# Patient Record
Sex: Female | Born: 1935 | Race: White | Hispanic: No | Smoking: Never smoker
Health system: Southern US, Community
[De-identification: ages and names within clinical notes are randomized; demographics above are authoritative.]

## PROBLEM LIST (undated history)

## (undated) DIAGNOSIS — Z923 Personal history of irradiation: Secondary | ICD-10-CM

## (undated) DIAGNOSIS — C4490 Unspecified malignant neoplasm of skin, unspecified: Secondary | ICD-10-CM

## (undated) DIAGNOSIS — E039 Hypothyroidism, unspecified: Secondary | ICD-10-CM

## (undated) DIAGNOSIS — R112 Nausea with vomiting, unspecified: Secondary | ICD-10-CM

## (undated) DIAGNOSIS — C189 Malignant neoplasm of colon, unspecified: Secondary | ICD-10-CM

## (undated) DIAGNOSIS — H9192 Unspecified hearing loss, left ear: Secondary | ICD-10-CM

## (undated) DIAGNOSIS — K219 Gastro-esophageal reflux disease without esophagitis: Secondary | ICD-10-CM

## (undated) DIAGNOSIS — Z9889 Other specified postprocedural states: Secondary | ICD-10-CM

## (undated) DIAGNOSIS — M1990 Unspecified osteoarthritis, unspecified site: Secondary | ICD-10-CM

## (undated) DIAGNOSIS — F32A Depression, unspecified: Secondary | ICD-10-CM

## (undated) DIAGNOSIS — E785 Hyperlipidemia, unspecified: Secondary | ICD-10-CM

## (undated) DIAGNOSIS — C801 Malignant (primary) neoplasm, unspecified: Secondary | ICD-10-CM

## (undated) DIAGNOSIS — I10 Essential (primary) hypertension: Secondary | ICD-10-CM

## (undated) DIAGNOSIS — F329 Major depressive disorder, single episode, unspecified: Secondary | ICD-10-CM

## (undated) HISTORY — PX: THYROIDECTOMY, PARTIAL: SHX18

## (undated) HISTORY — PX: CHOLECYSTECTOMY: SHX55

## (undated) HISTORY — PX: ERCP: SHX60

## (undated) HISTORY — PX: TONSILLECTOMY: SUR1361

## (undated) HISTORY — DX: Malignant (primary) neoplasm, unspecified: C80.1

## (undated) HISTORY — PX: EYE SURGERY: SHX253

## (undated) HISTORY — PX: APPENDECTOMY: SHX54

## (undated) HISTORY — PX: ABDOMINAL HYSTERECTOMY: SHX81

---

## 2003-06-13 ENCOUNTER — Encounter: Payer: Self-pay | Admitting: Internal Medicine

## 2003-06-13 ENCOUNTER — Encounter: Admission: RE | Admit: 2003-06-13 | Discharge: 2003-06-13 | Payer: Self-pay | Admitting: Internal Medicine

## 2003-07-12 ENCOUNTER — Encounter: Payer: Self-pay | Admitting: Internal Medicine

## 2003-07-12 ENCOUNTER — Encounter: Admission: RE | Admit: 2003-07-12 | Discharge: 2003-07-12 | Payer: Self-pay | Admitting: Internal Medicine

## 2003-08-24 ENCOUNTER — Ambulatory Visit (HOSPITAL_COMMUNITY): Admission: RE | Admit: 2003-08-24 | Discharge: 2003-08-24 | Payer: Self-pay | Admitting: Gastroenterology

## 2007-02-17 ENCOUNTER — Encounter: Admission: RE | Admit: 2007-02-17 | Discharge: 2007-02-17 | Payer: Self-pay | Admitting: Internal Medicine

## 2009-03-29 ENCOUNTER — Encounter: Admission: RE | Admit: 2009-03-29 | Discharge: 2009-03-29 | Payer: Self-pay | Admitting: Orthopedic Surgery

## 2010-04-04 ENCOUNTER — Encounter: Admission: RE | Admit: 2010-04-04 | Discharge: 2010-04-04 | Payer: Self-pay | Admitting: Internal Medicine

## 2011-05-09 NOTE — Op Note (Signed)
NAME:  Cindy [name], Cindy [name]                   ACCOUNT NO.:  [id number]   MEDICAL RECORD NO.:  [id number]                   PATIENT TYPE:  AMB   LOCATION:  ENDO                                 FACILITY:  [facility]   PHYSICIAN:  Danise Edge, M.D.                DATE OF BIRTH:  December 29, 1929   DATE OF PROCEDURE:  08/24/2003  DATE OF DISCHARGE:                                 OPERATIVE REPORT   PROCEDURE PERFORMED:  Esophagogastroduodenoscopy and colonoscopy.   ENDOSCOPIST:  Charolett Bumpers, M.D.   INDICATIONS FOR PROCEDURE:  [name] is a 77 year old female born  1930-10-16.  [name] has epigastric discomfort despite taking  a proton pump inhibitor and is scheduled to undergo a diagnostic  esophagogastroduodenoscopy.  She has a history of diverticulitis and is  scheduled to undergo a screening colonoscopy with polypectomy to prevent  colon cancer.   PREMEDICATION:  Versed 7.5 mg, Demerol 50 mg.   PROCEDURE:  Esophagogastroduodenoscopy.  After obtaining informed consent,  [name] was placed in the left lateral decubitus position.  I  administered intravenous Demerol and intravenous Versed to achieve conscious  sedation for the procedure.  The patient's blood pressure, oxygen  saturations and cardiac rhythm were monitored throughout the procedure and  documented in the medical record.   The Olympus gastroscope was passed through the posterior hypopharynx into  the proximal esophagus without difficulty.  The hypopharynx, larynx, and  vocal cords appeared normal.  I did not visualize the vocal cords.   Esophagoscopy:  The proximal and mid and lower segments of the esophagus  appeared normal endoscopically.   Gastroscopy:  Retroflex view of the gastric cardia and fundus was normal.  The gastric body, antrum and pylorus appeared normal.   Duodenoscopy:  The duodenal bulb and descending duodenum appeared normal.   ASSESSMENT:  Normal  esophagogastroduodenoscopy.   PROCEDURE:  Screening proctocolonoscopy to the cecum.  Anal inspection was  normal.  Digital rectal exam was normal.  The Olympus pediatric adjustable  colonoscope was introduced into the rectum and easily advanced to the cecum.  Colonic preparation for the exam today was excellent.   Rectum:  Normal.   Sigmoid colon and descending colon:  Left colonic diverticulosis without  diverticulitis or diverticular stricture formation.   Splenic flexure:  Normal.   Transverse colon:  Normal.   Hepatic flexure:  Normal.   Ascending colon:  Normal.   Cecum and ileocecal valve:  Normal.   ASSESSMENT:  Normal proctocolonoscopy to the cecum.  Left colonic  diverticulosis noted.  Moderate sized internal hemorrhoids are present.                                                Danise Edge, M.D.    MJ/MEDQ  D:  08/24/2003  T:  08/24/2003  Job:  [id number]   cc:   Candyce Churn, M.D.  [address] [place]  [place] [address]  Fax: [phone]

## 2011-06-06 ENCOUNTER — Other Ambulatory Visit: Payer: Self-pay | Admitting: Internal Medicine

## 2011-06-06 DIAGNOSIS — R51 Headache: Secondary | ICD-10-CM

## 2011-06-14 ENCOUNTER — Ambulatory Visit
Admission: RE | Admit: 2011-06-14 | Discharge: 2011-06-14 | Disposition: A | Discharge status: Home / Self Care | Payer: Medicare Other | Source: Ambulatory Visit | Attending: Internal Medicine | Admitting: Internal Medicine

## 2011-06-14 DIAGNOSIS — R51 Headache: Secondary | ICD-10-CM

## 2011-06-18 ENCOUNTER — Other Ambulatory Visit (HOSPITAL_COMMUNITY): Payer: Self-pay | Admitting: Neurosurgery

## 2011-06-18 DIAGNOSIS — I671 Cerebral aneurysm, nonruptured: Secondary | ICD-10-CM

## 2011-06-20 ENCOUNTER — Other Ambulatory Visit (HOSPITAL_COMMUNITY): Payer: Self-pay | Admitting: Neurosurgery

## 2011-06-20 ENCOUNTER — Ambulatory Visit (HOSPITAL_COMMUNITY)
Admission: RE | Admit: 2011-06-20 | Discharge: 2011-06-20 | Disposition: A | Discharge status: Home / Self Care | Payer: Medicare Other | Source: Ambulatory Visit | Attending: Neurosurgery | Admitting: Neurosurgery

## 2011-06-20 DIAGNOSIS — R51 Headache: Secondary | ICD-10-CM | POA: Insufficient documentation

## 2011-06-20 DIAGNOSIS — I671 Cerebral aneurysm, nonruptured: Secondary | ICD-10-CM

## 2011-06-20 DIAGNOSIS — Z01812 Encounter for preprocedural laboratory examination: Secondary | ICD-10-CM | POA: Insufficient documentation

## 2011-06-20 DIAGNOSIS — Z79899 Other long term (current) drug therapy: Secondary | ICD-10-CM | POA: Insufficient documentation

## 2011-06-20 LAB — CBC
HCT: 35.7 % — ABNORMAL LOW (ref 36.0–46.0)
Hemoglobin: 12.5 g/dL (ref 12.0–15.0)
MCV: 92.5 fL (ref 78.0–100.0)
RBC: 3.86 MIL/uL — ABNORMAL LOW (ref 3.87–5.11)
RDW: 12.2 % (ref 11.5–15.5)
WBC: 5.3 10*3/uL (ref 4.0–10.5)

## 2011-06-20 LAB — BASIC METABOLIC PANEL
CO2: 26 mEq/L (ref 19–32)
Calcium: 9.4 mg/dL (ref 8.4–10.5)
Creatinine, Ser: 0.83 mg/dL (ref 0.50–1.10)
GFR calc Af Amer: >60 mL/min (ref >60)

## 2011-06-20 LAB — APTT: aPTT: 26 seconds (ref 24–37)

## 2011-06-20 LAB — PROTIME-INR: INR: 0.95 (ref 0.00–1.49)

## 2011-06-20 MED ORDER — IOHEXOL 300 MG/ML  SOLN
200.0000 mL | Freq: Once | INTRAMUSCULAR | Status: AC | PRN
  Administered 2011-06-20: 85 mL via INTRA_ARTERIAL

## 2011-10-16 ENCOUNTER — Ambulatory Visit (HOSPITAL_COMMUNITY)
Admission: RE | Admit: 2011-10-16 | Discharge: 2011-10-16 | Disposition: A | Discharge status: Home / Self Care | Payer: Medicare Other | Source: Ambulatory Visit | Attending: Orthopedic Surgery | Admitting: Orthopedic Surgery

## 2011-10-16 DIAGNOSIS — M79609 Pain in unspecified limb: Secondary | ICD-10-CM | POA: Insufficient documentation

## 2011-12-25 DIAGNOSIS — M238X9 Other internal derangements of unspecified knee: Secondary | ICD-10-CM | POA: Diagnosis not present

## 2011-12-25 DIAGNOSIS — M25569 Pain in unspecified knee: Secondary | ICD-10-CM | POA: Diagnosis not present

## 2011-12-25 DIAGNOSIS — R262 Difficulty in walking, not elsewhere classified: Secondary | ICD-10-CM | POA: Diagnosis not present

## 2011-12-30 DIAGNOSIS — M238X9 Other internal derangements of unspecified knee: Secondary | ICD-10-CM | POA: Diagnosis not present

## 2011-12-30 DIAGNOSIS — M25569 Pain in unspecified knee: Secondary | ICD-10-CM | POA: Diagnosis not present

## 2011-12-30 DIAGNOSIS — R262 Difficulty in walking, not elsewhere classified: Secondary | ICD-10-CM | POA: Diagnosis not present

## 2011-12-31 ENCOUNTER — Other Ambulatory Visit (HOSPITAL_COMMUNITY): Payer: Self-pay | Admitting: Neurosurgery

## 2011-12-31 DIAGNOSIS — I671 Cerebral aneurysm, nonruptured: Secondary | ICD-10-CM

## 2012-01-01 DIAGNOSIS — M25569 Pain in unspecified knee: Secondary | ICD-10-CM | POA: Diagnosis not present

## 2012-01-02 DIAGNOSIS — M238X9 Other internal derangements of unspecified knee: Secondary | ICD-10-CM | POA: Diagnosis not present

## 2012-01-02 DIAGNOSIS — R262 Difficulty in walking, not elsewhere classified: Secondary | ICD-10-CM | POA: Diagnosis not present

## 2012-01-02 DIAGNOSIS — M25569 Pain in unspecified knee: Secondary | ICD-10-CM | POA: Diagnosis not present

## 2012-01-05 ENCOUNTER — Ambulatory Visit (HOSPITAL_COMMUNITY)
Admission: RE | Admit: 2012-01-05 | Discharge: 2012-01-05 | Disposition: A | Discharge status: Home / Self Care | Payer: Medicare Other | Source: Ambulatory Visit | Attending: Neurosurgery | Admitting: Neurosurgery

## 2012-01-05 DIAGNOSIS — I671 Cerebral aneurysm, nonruptured: Secondary | ICD-10-CM | POA: Diagnosis not present

## 2012-01-05 DIAGNOSIS — Z09 Encounter for follow-up examination after completed treatment for conditions other than malignant neoplasm: Secondary | ICD-10-CM | POA: Insufficient documentation

## 2012-01-05 LAB — BUN: BUN: 20 mg/dL (ref 6–23)

## 2012-01-05 MED ORDER — IOHEXOL 350 MG/ML SOLN
50.0000 mL | Freq: Once | INTRAVENOUS | Status: AC | PRN
  Administered 2012-01-05: 50 mL via INTRAVENOUS

## 2012-01-06 DIAGNOSIS — M238X9 Other internal derangements of unspecified knee: Secondary | ICD-10-CM | POA: Diagnosis not present

## 2012-01-06 DIAGNOSIS — M25569 Pain in unspecified knee: Secondary | ICD-10-CM | POA: Diagnosis not present

## 2012-01-06 DIAGNOSIS — R262 Difficulty in walking, not elsewhere classified: Secondary | ICD-10-CM | POA: Diagnosis not present

## 2012-01-13 DIAGNOSIS — R262 Difficulty in walking, not elsewhere classified: Secondary | ICD-10-CM | POA: Diagnosis not present

## 2012-01-13 DIAGNOSIS — M238X9 Other internal derangements of unspecified knee: Secondary | ICD-10-CM | POA: Diagnosis not present

## 2012-01-13 DIAGNOSIS — M25569 Pain in unspecified knee: Secondary | ICD-10-CM | POA: Diagnosis not present

## 2012-01-15 DIAGNOSIS — M238X9 Other internal derangements of unspecified knee: Secondary | ICD-10-CM | POA: Diagnosis not present

## 2012-01-15 DIAGNOSIS — M25569 Pain in unspecified knee: Secondary | ICD-10-CM | POA: Diagnosis not present

## 2012-01-15 DIAGNOSIS — R262 Difficulty in walking, not elsewhere classified: Secondary | ICD-10-CM | POA: Diagnosis not present

## 2012-01-21 DIAGNOSIS — I671 Cerebral aneurysm, nonruptured: Secondary | ICD-10-CM | POA: Diagnosis not present

## 2012-02-11 ENCOUNTER — Other Ambulatory Visit: Payer: Self-pay | Admitting: Internal Medicine

## 2012-02-11 DIAGNOSIS — E782 Mixed hyperlipidemia: Secondary | ICD-10-CM | POA: Diagnosis not present

## 2012-02-11 DIAGNOSIS — Z79899 Other long term (current) drug therapy: Secondary | ICD-10-CM | POA: Diagnosis not present

## 2012-02-11 DIAGNOSIS — E538 Deficiency of other specified B group vitamins: Secondary | ICD-10-CM | POA: Diagnosis not present

## 2012-02-11 DIAGNOSIS — B0229 Other postherpetic nervous system involvement: Secondary | ICD-10-CM | POA: Diagnosis not present

## 2012-02-11 DIAGNOSIS — Z0000 Encounter for general adult medical examination without abnormal findings: Secondary | ICD-10-CM | POA: Diagnosis not present

## 2012-02-11 DIAGNOSIS — M79609 Pain in unspecified limb: Secondary | ICD-10-CM | POA: Diagnosis not present

## 2012-02-11 DIAGNOSIS — R609 Edema, unspecified: Secondary | ICD-10-CM | POA: Diagnosis not present

## 2012-02-11 DIAGNOSIS — E039 Hypothyroidism, unspecified: Secondary | ICD-10-CM | POA: Diagnosis not present

## 2012-02-11 DIAGNOSIS — M79669 Pain in unspecified lower leg: Secondary | ICD-10-CM

## 2012-02-12 ENCOUNTER — Ambulatory Visit
Admission: RE | Admit: 2012-02-12 | Discharge: 2012-02-12 | Disposition: A | Discharge status: Home / Self Care | Payer: Medicare Other | Source: Ambulatory Visit | Attending: Internal Medicine | Admitting: Internal Medicine

## 2012-02-12 DIAGNOSIS — M7989 Other specified soft tissue disorders: Secondary | ICD-10-CM | POA: Diagnosis not present

## 2012-02-12 DIAGNOSIS — M79609 Pain in unspecified limb: Secondary | ICD-10-CM | POA: Diagnosis not present

## 2012-02-12 DIAGNOSIS — M79669 Pain in unspecified lower leg: Secondary | ICD-10-CM

## 2012-02-18 DIAGNOSIS — E782 Mixed hyperlipidemia: Secondary | ICD-10-CM | POA: Diagnosis not present

## 2012-02-18 DIAGNOSIS — M79609 Pain in unspecified limb: Secondary | ICD-10-CM | POA: Diagnosis not present

## 2012-02-20 DIAGNOSIS — Z803 Family history of malignant neoplasm of breast: Secondary | ICD-10-CM | POA: Diagnosis not present

## 2012-02-20 DIAGNOSIS — Z1231 Encounter for screening mammogram for malignant neoplasm of breast: Secondary | ICD-10-CM | POA: Diagnosis not present

## 2012-02-23 ENCOUNTER — Other Ambulatory Visit: Payer: Self-pay | Admitting: Orthopedic Surgery

## 2012-02-23 DIAGNOSIS — M7120 Synovial cyst of popliteal space [Baker], unspecified knee: Secondary | ICD-10-CM

## 2012-02-23 DIAGNOSIS — M238X9 Other internal derangements of unspecified knee: Secondary | ICD-10-CM | POA: Diagnosis not present

## 2012-02-26 ENCOUNTER — Ambulatory Visit
Admission: RE | Admit: 2012-02-26 | Discharge: 2012-02-26 | Disposition: A | Discharge status: Home / Self Care | Payer: Medicare Other | Source: Ambulatory Visit | Attending: Orthopedic Surgery | Admitting: Orthopedic Surgery

## 2012-02-26 DIAGNOSIS — M7120 Synovial cyst of popliteal space [Baker], unspecified knee: Secondary | ICD-10-CM

## 2012-02-26 DIAGNOSIS — S8010XA Contusion of unspecified lower leg, initial encounter: Secondary | ICD-10-CM | POA: Diagnosis not present

## 2012-02-26 DIAGNOSIS — M79609 Pain in unspecified limb: Secondary | ICD-10-CM | POA: Diagnosis not present

## 2012-04-14 DIAGNOSIS — H3530 Unspecified macular degeneration: Secondary | ICD-10-CM | POA: Diagnosis not present

## 2012-04-21 DIAGNOSIS — E538 Deficiency of other specified B group vitamins: Secondary | ICD-10-CM | POA: Diagnosis not present

## 2012-04-21 DIAGNOSIS — M79609 Pain in unspecified limb: Secondary | ICD-10-CM | POA: Diagnosis not present

## 2012-04-21 DIAGNOSIS — E782 Mixed hyperlipidemia: Secondary | ICD-10-CM | POA: Diagnosis not present

## 2012-04-26 DIAGNOSIS — M25569 Pain in unspecified knee: Secondary | ICD-10-CM | POA: Diagnosis not present

## 2012-04-26 DIAGNOSIS — M545 Low back pain: Secondary | ICD-10-CM | POA: Diagnosis not present

## 2012-05-03 ENCOUNTER — Other Ambulatory Visit: Payer: Self-pay | Admitting: Orthopaedic Surgery

## 2012-05-03 DIAGNOSIS — M25561 Pain in right knee: Secondary | ICD-10-CM

## 2012-05-03 DIAGNOSIS — M1710 Unilateral primary osteoarthritis, unspecified knee: Secondary | ICD-10-CM | POA: Diagnosis not present

## 2012-05-05 ENCOUNTER — Ambulatory Visit
Admission: RE | Admit: 2012-05-05 | Discharge: 2012-05-05 | Disposition: A | Discharge status: Home / Self Care | Payer: Medicare Other | Source: Ambulatory Visit | Attending: Orthopaedic Surgery | Admitting: Orthopaedic Surgery

## 2012-05-05 DIAGNOSIS — M2240 Chondromalacia patellae, unspecified knee: Secondary | ICD-10-CM | POA: Diagnosis not present

## 2012-05-05 DIAGNOSIS — M25569 Pain in unspecified knee: Secondary | ICD-10-CM | POA: Diagnosis not present

## 2012-05-05 DIAGNOSIS — S83419A Sprain of medial collateral ligament of unspecified knee, initial encounter: Secondary | ICD-10-CM | POA: Diagnosis not present

## 2012-05-05 DIAGNOSIS — M25561 Pain in right knee: Secondary | ICD-10-CM

## 2012-05-07 ENCOUNTER — Other Ambulatory Visit: Payer: Self-pay | Admitting: Orthopaedic Surgery

## 2012-05-07 DIAGNOSIS — M25569 Pain in unspecified knee: Secondary | ICD-10-CM | POA: Diagnosis not present

## 2012-05-07 NOTE — H&P (Signed)
Cindy [name] is an 78 y.o. female.   Chief Complaint: right knee pain HPI: this patient continues to complain of right knee pain multiple weeks in duration.  She is having trouble sleeping at nighttime and ambulating.  She describes her pain as being intermittent and severe.  We have tried a recent corticosteroid injection with minimal to no benefit.  He recent MRI scan reveals some degenerative changes with a new medial meniscus tear.  We have discussed proceeding with a knee arthroscopy to take care of the meniscus tear noted on the MRI scan.  No past medical history on file. Positive for hypertension and positive for hypothyroidism positive for GERD and positive for cholesterol issues Medication list: Norvasc, thyroid replacement, clonazepam, Nexium,micardis and Crestor  No past surgical history on file.  No family history on file. Social History:  does not have a smoking history on file. She does not have any smokeless tobacco history on file. Her alcohol and drug histories not on file.  Allergies: Allergies not on file Allergy to morphine sulfate  No prescriptions prior to admission    No results found for this or any previous visit (from the past 48 hour(s)). No results found.  Review of Systems  Constitutional: Negative.   HENT: Negative.   Eyes: Negative.   Respiratory: Negative.   Cardiovascular: Negative.   Gastrointestinal: Negative.   Genitourinary: Negative.   Musculoskeletal: Negative.   Skin: Negative.   Neurological: Negative.   Endo/Heme/Allergies: Negative.   Psychiatric/Behavioral: Negative.     There were no vitals taken for this visit. Physical Exam  Constitutional: She is oriented to person, place, and time. She appears well-nourished.  HENT:  Head: Atraumatic.  Eyes: Pupils are equal, round, and reactive to light.  Neck: Normal range of motion.  Cardiovascular: Regular rhythm.   Respiratory: Breath sounds normal.  GI: Soft.  Musculoskeletal:         Right knee exam: Trace effusion.  Range of motion 0 to 1:30.  Mild patellofemoral crepitation 1+.  Pain along the medial joint line to palpation and a mildly positive McMurray's test as well for pain.  Neurological: She is oriented to person, place, and time.  Skin: Skin is warm.  Psychiatric: She has a normal mood and affect.     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  We have discussed the risks of anesthesia and infection and DVT associated with knee arthroscopy.  Have also stressed to her the importance of postoperative physical therapy to optimize her results.  Cindy [name] R 05/07/2012, 1:11 PM

## 2012-05-10 ENCOUNTER — Encounter (HOSPITAL_BASED_OUTPATIENT_CLINIC_OR_DEPARTMENT_OTHER): Payer: Self-pay | Admitting: *Deleted

## 2012-05-10 ENCOUNTER — Encounter (HOSPITAL_BASED_OUTPATIENT_CLINIC_OR_DEPARTMENT_OTHER)
Admission: RE | Admit: 2012-05-10 | Discharge: 2012-05-10 | Disposition: A | Discharge status: Home / Self Care | Payer: Medicare Other | Source: Ambulatory Visit | Attending: Orthopaedic Surgery | Admitting: Orthopaedic Surgery

## 2012-05-10 DIAGNOSIS — I10 Essential (primary) hypertension: Secondary | ICD-10-CM | POA: Diagnosis not present

## 2012-05-10 DIAGNOSIS — M23329 Other meniscus derangements, posterior horn of medial meniscus, unspecified knee: Secondary | ICD-10-CM | POA: Diagnosis not present

## 2012-05-10 DIAGNOSIS — Z01810 Encounter for preprocedural cardiovascular examination: Secondary | ICD-10-CM | POA: Diagnosis not present

## 2012-05-10 DIAGNOSIS — M2240 Chondromalacia patellae, unspecified knee: Secondary | ICD-10-CM | POA: Diagnosis not present

## 2012-05-10 DIAGNOSIS — E039 Hypothyroidism, unspecified: Secondary | ICD-10-CM | POA: Diagnosis not present

## 2012-05-10 DIAGNOSIS — K219 Gastro-esophageal reflux disease without esophagitis: Secondary | ICD-10-CM | POA: Diagnosis not present

## 2012-05-10 DIAGNOSIS — M1710 Unilateral primary osteoarthritis, unspecified knee: Secondary | ICD-10-CM | POA: Diagnosis not present

## 2012-05-10 LAB — BASIC METABOLIC PANEL
BUN: 17 mg/dL (ref 6–23)
CO2: 24 mEq/L (ref 19–32)
Chloride: 96 mEq/L (ref 96–112)
GFR calc non Af Amer: 78 mL/min — ABNORMAL LOW (ref >90)
Glucose, Bld: 104 mg/dL — ABNORMAL HIGH (ref 70–99)
Potassium: 3.7 mEq/L (ref 3.5–5.1)
Sodium: 131 mEq/L — ABNORMAL LOW (ref 135–145)

## 2012-05-10 NOTE — Progress Notes (Signed)
Cindy [name] come in for labs and ekg

## 2012-05-11 ENCOUNTER — Encounter (HOSPITAL_BASED_OUTPATIENT_CLINIC_OR_DEPARTMENT_OTHER): Payer: Self-pay | Admitting: *Deleted

## 2012-05-11 ENCOUNTER — Encounter (HOSPITAL_BASED_OUTPATIENT_CLINIC_OR_DEPARTMENT_OTHER)
Admission: RE | Disposition: A | Discharge status: Home / Self Care | Payer: Self-pay | Source: Ambulatory Visit | Attending: Orthopaedic Surgery

## 2012-05-11 ENCOUNTER — Encounter (HOSPITAL_BASED_OUTPATIENT_CLINIC_OR_DEPARTMENT_OTHER): Payer: Self-pay | Admitting: Anesthesiology

## 2012-05-11 ENCOUNTER — Ambulatory Visit (HOSPITAL_BASED_OUTPATIENT_CLINIC_OR_DEPARTMENT_OTHER): Payer: Medicare Other | Admitting: *Deleted

## 2012-05-11 ENCOUNTER — Ambulatory Visit (HOSPITAL_BASED_OUTPATIENT_CLINIC_OR_DEPARTMENT_OTHER)
Admission: RE | Admit: 2012-05-11 | Discharge: 2012-05-11 | Disposition: A | Discharge status: Home / Self Care | Payer: Medicare Other | Source: Ambulatory Visit | Attending: Orthopaedic Surgery | Admitting: Orthopaedic Surgery

## 2012-05-11 DIAGNOSIS — I10 Essential (primary) hypertension: Secondary | ICD-10-CM | POA: Insufficient documentation

## 2012-05-11 DIAGNOSIS — M23329 Other meniscus derangements, posterior horn of medial meniscus, unspecified knee: Secondary | ICD-10-CM | POA: Insufficient documentation

## 2012-05-11 DIAGNOSIS — M2240 Chondromalacia patellae, unspecified knee: Secondary | ICD-10-CM | POA: Insufficient documentation

## 2012-05-11 DIAGNOSIS — IMO0002 Reserved for concepts with insufficient information to code with codable children: Secondary | ICD-10-CM | POA: Diagnosis not present

## 2012-05-11 DIAGNOSIS — M1710 Unilateral primary osteoarthritis, unspecified knee: Secondary | ICD-10-CM | POA: Insufficient documentation

## 2012-05-11 DIAGNOSIS — Z01810 Encounter for preprocedural cardiovascular examination: Secondary | ICD-10-CM | POA: Insufficient documentation

## 2012-05-11 DIAGNOSIS — S83249A Other tear of medial meniscus, current injury, unspecified knee, initial encounter: Secondary | ICD-10-CM

## 2012-05-11 DIAGNOSIS — K219 Gastro-esophageal reflux disease without esophagitis: Secondary | ICD-10-CM | POA: Diagnosis not present

## 2012-05-11 DIAGNOSIS — E039 Hypothyroidism, unspecified: Secondary | ICD-10-CM | POA: Insufficient documentation

## 2012-05-11 DIAGNOSIS — M9420 Chondromalacia, unspecified site: Secondary | ICD-10-CM | POA: Diagnosis not present

## 2012-05-11 HISTORY — DX: Nausea with vomiting, unspecified: R11.2

## 2012-05-11 HISTORY — DX: Other specified postprocedural states: Z98.890

## 2012-05-11 HISTORY — DX: Hyperlipidemia, unspecified: E78.5

## 2012-05-11 HISTORY — DX: Depression, unspecified: F32.A

## 2012-05-11 HISTORY — PX: KNEE ARTHROSCOPY: SHX127

## 2012-05-11 HISTORY — DX: Unspecified osteoarthritis, unspecified site: M19.90

## 2012-05-11 HISTORY — DX: Gastro-esophageal reflux disease without esophagitis: K21.9

## 2012-05-11 HISTORY — DX: Hypothyroidism, unspecified: E03.9

## 2012-05-11 HISTORY — DX: Essential (primary) hypertension: I10

## 2012-05-11 HISTORY — DX: Major depressive disorder, single episode, unspecified: F32.9

## 2012-05-11 HISTORY — DX: Unspecified hearing loss, left ear: H91.92

## 2012-05-11 SURGERY — ARTHROSCOPY, KNEE
Anesthesia: Choice | Site: Knee | Laterality: Right | Wound class: Clean

## 2012-05-11 MED ORDER — DROPERIDOL 2.5 MG/ML IJ SOLN
INTRAMUSCULAR | Status: DC | PRN
  Administered 2012-05-11: 0.625 mg via INTRAVENOUS

## 2012-05-11 MED ORDER — PROPOFOL 10 MG/ML IV EMUL
INTRAVENOUS | Status: DC | PRN
  Administered 2012-05-11 (×2): 20 mg via INTRAVENOUS
  Administered 2012-05-11: 10 mg via INTRAVENOUS
  Administered 2012-05-11: 50 mg via INTRAVENOUS

## 2012-05-11 MED ORDER — BUPIVACAINE HCL (PF) 0.25 % IJ SOLN
INTRAMUSCULAR | Status: DC | PRN
  Administered 2012-05-11: 30 mL

## 2012-05-11 MED ORDER — HYDROMORPHONE HCL PF 1 MG/ML IJ SOLN: 0.2500 mg | INTRAMUSCULAR | Status: DC | PRN

## 2012-05-11 MED ORDER — FENTANYL CITRATE 0.05 MG/ML IJ SOLN
50.0000 ug | INTRAMUSCULAR | Status: DC | PRN
Start: 2012-05-11 — End: 2012-05-11
  Administered 2012-05-11: 50 ug via INTRAVENOUS

## 2012-05-11 MED ORDER — PROPOFOL 10 MG/ML IV EMUL
INTRAVENOUS | Status: DC | PRN
  Administered 2012-05-11: 75 ug/kg/min via INTRAVENOUS

## 2012-05-11 MED ORDER — DEXAMETHASONE SODIUM PHOSPHATE 4 MG/ML IJ SOLN
INTRAMUSCULAR | Status: DC | PRN
  Administered 2012-05-11: 10 mg via INTRAVENOUS

## 2012-05-11 MED ORDER — LACTATED RINGERS IV SOLN
INTRAVENOUS | Status: DC
  Administered 2012-05-11 (×3): via INTRAVENOUS

## 2012-05-11 MED ORDER — OXYCODONE-ACETAMINOPHEN 5-325 MG PO TABS: 1.0000 | ORAL_TABLET | ORAL | Status: AC | PRN

## 2012-05-11 MED ORDER — CEFAZOLIN SODIUM 1-5 GM-% IV SOLN
INTRAVENOUS | Status: DC | PRN
  Administered 2012-05-11: 1 g via INTRAVENOUS

## 2012-05-11 MED ORDER — MIDAZOLAM HCL 2 MG/2ML IJ SOLN
1.0000 mg | INTRAMUSCULAR | Status: DC | PRN
  Administered 2012-05-11: 1 mg via INTRAVENOUS

## 2012-05-11 MED ORDER — ACETAMINOPHEN 10 MG/ML IV SOLN
1000.0000 mg | Freq: Once | INTRAVENOUS | Status: AC
  Administered 2012-05-11: 1000 mg via INTRAVENOUS

## 2012-05-11 MED ORDER — ONDANSETRON HCL 4 MG/2ML IJ SOLN
INTRAMUSCULAR | Status: DC | PRN
  Administered 2012-05-11: 4 mg via INTRAVENOUS

## 2012-05-11 MED ORDER — METHYLPREDNISOLONE ACETATE 80 MG/ML IJ SUSP
INTRAMUSCULAR | Status: DC | PRN
  Administered 2012-05-11: 80 mg via INTRA_ARTICULAR

## 2012-05-11 SURGICAL SUPPLY — 39 items
BANDAGE ELASTIC 6 VELCRO ST LF (GAUZE/BANDAGES/DRESSINGS) ×2 IMPLANT
BANDAGE GAUZE ELAST BULKY 4 IN (GAUZE/BANDAGES/DRESSINGS) ×2 IMPLANT
BLADE CUDA 5.5 (BLADE) IMPLANT
BLADE GREAT WHITE 4.2 (BLADE) ×2 IMPLANT
CANISTER OMNI JUG 16 LITER (MISCELLANEOUS) ×1 IMPLANT
CANISTER SUCTION 2500CC (MISCELLANEOUS) IMPLANT
DRAPE ARTHROSCOPY W/POUCH 114 (DRAPES) ×2 IMPLANT
DRAPE U-SHAPE 47X51 STRL (DRAPES) ×2 IMPLANT
DRSG EMULSION OIL 3X3 NADH (GAUZE/BANDAGES/DRESSINGS) ×2 IMPLANT
DURAPREP 26ML APPLICATOR (WOUND CARE) ×2 IMPLANT
ELECT MENISCUS 165MM 90D (ELECTRODE) IMPLANT
ELECT REM PT RETURN 9FT ADLT (ELECTROSURGICAL)
ELECTRODE REM PT RTRN 9FT ADLT (ELECTROSURGICAL) IMPLANT
GLOVE BIO SURGEON STRL SZ 6.5 (GLOVE) ×1 IMPLANT
GLOVE BIO SURGEON STRL SZ8.5 (GLOVE) ×2 IMPLANT
GLOVE BIOGEL PI IND STRL 7.0 (GLOVE) IMPLANT
GLOVE BIOGEL PI IND STRL 8 (GLOVE) ×1 IMPLANT
GLOVE BIOGEL PI IND STRL 8.5 (GLOVE) ×1 IMPLANT
GLOVE BIOGEL PI INDICATOR 7.0 (GLOVE) ×1
GLOVE BIOGEL PI INDICATOR 8 (GLOVE) ×1
GLOVE BIOGEL PI INDICATOR 8.5 (GLOVE) ×1
GLOVE SS BIOGEL STRL SZ 8 (GLOVE) ×1 IMPLANT
GLOVE SUPERSENSE BIOGEL SZ 8 (GLOVE) ×1
GOWN PREVENTION PLUS XLARGE (GOWN DISPOSABLE) ×4 IMPLANT
GOWN PREVENTION PLUS XXLARGE (GOWN DISPOSABLE) ×1 IMPLANT
KNEE WRAP E Z 3 GEL PACK (MISCELLANEOUS) ×3 IMPLANT
PACK ARTHROSCOPY DSU (CUSTOM PROCEDURE TRAY) ×2 IMPLANT
PACK BASIN DAY SURGERY FS (CUSTOM PROCEDURE TRAY) ×2 IMPLANT
PENCIL BUTTON HOLSTER BLD 10FT (ELECTRODE) IMPLANT
SET ARTHROSCOPY TUBING (MISCELLANEOUS) ×2
SET ARTHROSCOPY TUBING LN (MISCELLANEOUS) ×1 IMPLANT
SHEET MEDIUM DRAPE 40X70 STRL (DRAPES) ×2 IMPLANT
SPONGE GAUZE 4X4 12PLY (GAUZE/BANDAGES/DRESSINGS) ×2 IMPLANT
SYR 3ML 18GX1 1/2 (SYRINGE) IMPLANT
TOWEL OR 17X24 6PK STRL BLUE (TOWEL DISPOSABLE) ×2 IMPLANT
TOWEL OR NON WOVEN STRL DISP B (DISPOSABLE) ×2 IMPLANT
WAND 30 DEG SABER W/CORD (SURGICAL WAND) IMPLANT
WAND STAR VAC 90 (SURGICAL WAND) IMPLANT
WATER STERILE IRR 1000ML POUR (IV SOLUTION) ×2 IMPLANT

## 2012-05-11 NOTE — Anesthesia Preprocedure Evaluation (Addendum)
Anesthesia Evaluation  Patient identified by MRN, date of birth, ID band Patient awake    Reviewed: Allergy & Precautions, H&P , NPO status , Patient's Chart, lab work & pertinent test results  History of Anesthesia Complications (+) PONV  Airway Mallampati: II TM Distance: >3 FB Neck ROM: Full    Dental No notable dental hx. (+) Edentulous Upper and Dental Advisory Given   Pulmonary neg pulmonary ROS,  breath sounds clear to auscultation  Pulmonary exam normal       Cardiovascular hypertension, On Medications Rhythm:Regular Rate:Normal     Neuro/Psych PSYCHIATRIC DISORDERS negative neurological ROS     GI/Hepatic Neg liver ROS, GERD-  Medicated,  Endo/Other  negative endocrine ROS  Renal/GU negative Renal ROS  negative genitourinary   Musculoskeletal   Abdominal   Peds  Hematology negative hematology ROS (+)   Anesthesia Other Findings   Reproductive/Obstetrics negative OB ROS                           Anesthesia Physical Anesthesia Plan  ASA: II  Anesthesia Plan: MAC   Post-op Pain Management: MAC Combined w/ Regional for Post-op pain   Induction: Intravenous  Airway Management Planned: Mask  Additional Equipment:   Intra-op Plan:   Post-operative Plan:   Informed Consent: I have reviewed the patients History and Physical, chart, labs and discussed the procedure including the risks, benefits and alternatives for the proposed anesthesia with the patient or authorized representative who has indicated his/her understanding and acceptance.   Dental advisory given  Plan Discussed with: CRNA  Anesthesia Plan Comments:         Anesthesia Quick Evaluation

## 2012-05-11 NOTE — Brief Op Note (Signed)
YARIMAR LAVIS [id number] 05/11/2012   PRE-OP DIAGNOSIS: right TMM and DJD  POST-OP DIAGNOSIS: same  PROCEDURE: right PMM and AB/CP  ANESTHESIA: block and general  Kerina Simoneau G   Dictation #:  forgot

## 2012-05-11 NOTE — Interval H&P Note (Signed)
History and Physical Interval Note:  05/11/2012 12:34 PM  [name]  has presented today for surgery, with the diagnosis of right knee mmt, chondromalacia  The various methods of treatment have been discussed with the patient and family. After consideration of risks, benefits and other options for treatment, the patient has consented to  Procedure(s) (LRB): ARTHROSCOPY KNEE (Right) as a surgical intervention .  The patients' history has been reviewed, patient examined, no change in status, stable for surgery.  I have reviewed the patients' chart and labs.  Questions were answered to the patient's satisfaction.     Tracker Mance G

## 2012-05-11 NOTE — Anesthesia Procedure Notes (Signed)
Anesthesia Regional Block: Narrative:       

## 2012-05-11 NOTE — Transfer of Care (Signed)
Immediate Anesthesia Transfer of Care Note  Patient: Cindy [name]  Procedure(s) Performed: Procedure(s) (LRB): ARTHROSCOPY KNEE (Right)  Patient Location: PACU  Anesthesia Type: General  Level of Consciousness: awake and alert   Airway & Oxygen Therapy: Patient Spontanous Breathing and Patient connected to face mask oxygen  Post-op Assessment: Report given to PACU RN and Post -op Vital signs reviewed and stable  Post vital signs: Reviewed and stable  Complications: No apparent anesthesia complications

## 2012-05-11 NOTE — Discharge Instructions (Addendum)
Arthroscopic Knee Surgery Post -OP Instructions  You have just had an arthroscopic operation. Your incisions (puncture sites) are small and should heal quickly. The structures inside your knee may take 6-8 weeks to heal and settle down. This healing time is variable and may range from a few days to 6-8 weeks.  Pain Medication: You will be given a prescription for pain medication. Please take the medication as needed. Most patients require pain medication for only a few days.  Swelling: You can expect some swelling in your knee. Applying ice and elevating your leg will help keep the swelling to a minimum. Ice can be applied by placing ice cubes in a plastic bag and putting the bag on your knee with a towel between the ice bag and your knee. Sometimes you will be issued an ice pack wrap and that will work as well. Your entire leg should be elevated, not just your knee. Elevate your leg to or above the level of your heart. The ice should be used periodically during the first 48 hours along with continued elevation of your leg. The swelling should subside over the next several weeks.  Dressing: Fluid leakage is common the first night. Precautions to prevent staining of your clothes, bed sheets, etc., should be taken. This fluid was used to inflate the joint during surgery and is commonly tinged red from a small amount of blood. Remove your dressing tomorrow. Some bleeding or leakage from the puncture sites may occur for a few days and you should cover the puncture sites with Band-Aids until the leakage stops.Alternatively you may reapply the ace wrap with some gauze pads over the puncture sites but don't wrap it too tightly.  You may shower 2 days after surgery.   Activity: You may bend and straighten your knee as soon as it is comfortable to do so. Using your knee will help decrease swelling and help prevent stiffness, as long as you don't overdo it. Moving your foot up and down also helps decrease the  swelling. There is no harm in putting weight on; your leg as long as it is comfortable to do so. (You should not, try to run or jump). Gradually increase activity as you can tolerate. An increase in pain or swelling with certain activities or with increased activities may indicate you are doing too much. Back up and start building up your activities at a slower rate. You may need crutches for a few days.  Office Check-Up: If no major problems arise and you are progressing well, we will need to see you in the office in one (1) week unless otherwise instructed by your doctor. Please call the office to make an appointment.   Post Anesthesia Home Care Instructions  Activity: Get plenty of rest for the remainder of the day. A responsible adult should stay with you for 24 hours following the procedure.  For the next 24 hours, DO NOT: -Drive a car -Advertising copywriter -Drink alcoholic beverages -Take any medication unless instructed by your physician -Make any legal decisions or sign important papers.  Meals: Start with liquid foods such as gelatin or soup. Progress to regular foods as tolerated. Avoid greasy, spicy, heavy foods. If nausea and/or vomiting occur, drink only clear liquids until the nausea and/or vomiting subsides. Call your physician if vomiting continues.  Special Instructions/Symptoms: Your throat may feel dry or sore from the anesthesia or the breathing tube placed in your throat during surgery. If this causes discomfort, gargle with warm salt  water. The discomfort should disappear within 24 hours.    Regional Anesthesia Blocks  1. Numbness or the inability to move the "blocked" extremity may last from 3-48 hours after placement. The length of time depends on the medication injected and your individual response to the medication. If the numbness is not going away after 48 hours, call your surgeon.  2. The extremity that is blocked will need to be protected until the numbness is  gone and the  Strength has returned. Because you cannot feel it, you will need to take extra care to avoid injury. Because it may be weak, you may have difficulty moving it or using it. You may not know what position it is in without looking at it while the block is in effect.  3. For blocks in the legs and feet, returning to weight bearing and walking needs to be done carefully. You will need to wait until the numbness is entirely gone and the strength has returned. You should be able to move your leg and foot normally before you try and bear weight or walk. You will need someone to be with you when you first try to ensure you do not fall and possibly risk injury.  4. Bruising and tenderness at the needle site are common side effects and will resolve in a few days.  5. Persistent numbness or new problems with movement should be communicated to the surgeon or the [facility] [phone] [facility] [phone]).

## 2012-05-11 NOTE — Anesthesia Postprocedure Evaluation (Signed)
  Anesthesia Post-op Note  Patient: Cindy [name]  Procedure(s) Performed: Procedure(s) (LRB): ARTHROSCOPY KNEE (Right)  Patient Location: PACU  Anesthesia Type: General  Level of Consciousness: awake  Airway and Oxygen Therapy: Patient Spontanous Breathing and Patient connected to face mask oxygen  Post-op Pain: none  Post-op Assessment: Post-op Vital signs reviewed, Patient's Cardiovascular Status Stable, Respiratory Function Stable and Patent Airway  Post-op Vital Signs: Reviewed and stable  Complications: No apparent anesthesia complications

## 2012-05-11 NOTE — Procedures (Signed)
The patients knee was sterily prepped and draped. After sedation the portal sites were injected with 20cc of 1.5% Carbocaine. The intraarticular space was then injected with 30cc of 0.5% Bupivicaine with 1:200k epi. All injections were done after negative aspiration of heme.

## 2012-05-11 NOTE — Anesthesia Postprocedure Evaluation (Signed)
Anesthesia Post Note  Patient: Cindy [name]  Procedure(s) Performed: Procedure(s) (LRB): ARTHROSCOPY KNEE (Right)  Anesthesia type: General  Patient location: PACU  Post pain: Pain level controlled  Post assessment: Patient's Cardiovascular Status Stable  Last Vitals:  Filed Vitals:   05/11/12 1415  BP:   Pulse: 74  Temp:   Resp: 17    Post vital signs: Reviewed and stable  Level of consciousness: alert  Complications: No apparent anesthesia complications

## 2012-05-11 NOTE — Progress Notes (Signed)
Assisted Dr. Fitzgerald with right, knee block. Side rails up, monitors on throughout procedure. See vital signs in flow sheet. Tolerated Procedure well. 

## 2012-05-12 ENCOUNTER — Encounter (HOSPITAL_BASED_OUTPATIENT_CLINIC_OR_DEPARTMENT_OTHER): Payer: Self-pay | Admitting: Orthopaedic Surgery

## 2012-05-12 NOTE — Op Note (Signed)
NAME:  Cindy [name], Cindy [name]                   ACCOUNT NO.:  MEDICAL RECORD NO.:  [id number]  LOCATION:                                 FACILITY:  PHYSICIAN:  Lubertha Basque. Emmerich Cryer, M.D.DATE OF BIRTH:  1930/05/05  DATE OF PROCEDURE:  05/11/2012 DATE OF DISCHARGE:                              OPERATIVE REPORT   PREOPERATIVE DIAGNOSES: 1. Right knee torn medial meniscus. 2. Right knee chondromalacia.  POSTOPERATIVE DIAGNOSES: 1. Right knee torn medial meniscus. 2. Right knee chondromalacia.  PROCEDURES: 1. Right knee partial medial meniscectomy. 2. Right knee abrasion chondroplasty, medial and patellofemoral.  ANESTHESIA:  Knee block, general.  ATTENDING SURGEON:  Lubertha Basque. Jerl Santos, M.D.  ASSISTANT:  Lindwood Qua, P.A.  INDICATION FOR PROCEDURE:  The patient is an 78 year old woman with a long history of right knee difficulty.  She has seen multiple doctors and has had injections and therapies and braces and even an arthroscopy. She persists with difficulty.  A new MRI scan has shown a medial meniscus tear along with some degenerative changes.  By x-ray and arthroscopic picture review, it does not look as though she is ready for a knee replacement.  My hope is that we can make her better with a repeat arthroscopy.  Informed operative consent was obtained after discussion of possible complications including reaction to anesthesia and infection.  She understands this operation is intended to make her better, but certainly not new in terms of her knee function.  SUMMARY OF FINDINGS AND PROCEDURE:  Under general anesthesia, after a knee block, a right knee arthroscopy was performed.  The suprapatellar pouch was benign, while the patellofemoral joint exhibited some flaps of articular cartilage in the intertrochlear groove, addressed with chondroplasty to stable tissues and an abrasion to bleeding bone.  The patella itself tracked relatively well.  The medial compartment was notable  for broad areas of grade 3 and 4 change across the femur with a small posterior and middle horn medial meniscus tear.  About a 5% partial medial meniscectomy was done.  Most of the posterior middle horn was gone at this point.  ACL was intact and the lateral compartment was benign.  DESCRIPTION OF PROCEDURE:  The patient was taken to the operating suite where a knee block was applied.  This did not work particularly well and this was followed by a general anesthetic.  She was positioned supine, prepped and draped in normal sterile fashion.  After administration of IV Kefzol, an arthroscopy of the right knee was performed through total of 2 portals.  Findings were as noted above and procedure consisted of the chondroplasty with abrasion to bleeding bone of patellofemoral and medial, followed by the partial medial meniscectomy done with basket and shaver.  The knee was thoroughly irrigated, followed by the placement of Marcaine with some Depo-Medrol.  Adaptic was placed over the portals, followed by dry gauze and a loose Ace wrap.  Estimated blood loss and fluids can be obtained from the anesthesia records.  DISPOSITION:  The patient was extubated in the operating room and taken to recovery room in stable addition.  She was to go home same-day and follow up in the office  closely.  I will contact her by phone tonight.     Lubertha Basque Jerl Santos, M.D.     PGD/MEDQ  D:  05/11/2012  T:  05/11/2012  Job:  [id number]

## 2012-05-18 ENCOUNTER — Encounter (HOSPITAL_BASED_OUTPATIENT_CLINIC_OR_DEPARTMENT_OTHER): Payer: Self-pay

## 2012-05-26 DIAGNOSIS — IMO0002 Reserved for concepts with insufficient information to code with codable children: Secondary | ICD-10-CM | POA: Diagnosis not present

## 2012-05-26 DIAGNOSIS — M2240 Chondromalacia patellae, unspecified knee: Secondary | ICD-10-CM | POA: Diagnosis not present

## 2012-05-31 DIAGNOSIS — IMO0002 Reserved for concepts with insufficient information to code with codable children: Secondary | ICD-10-CM | POA: Diagnosis not present

## 2012-05-31 DIAGNOSIS — M2240 Chondromalacia patellae, unspecified knee: Secondary | ICD-10-CM | POA: Diagnosis not present

## 2012-06-03 DIAGNOSIS — M2240 Chondromalacia patellae, unspecified knee: Secondary | ICD-10-CM | POA: Diagnosis not present

## 2012-06-03 DIAGNOSIS — IMO0002 Reserved for concepts with insufficient information to code with codable children: Secondary | ICD-10-CM | POA: Diagnosis not present

## 2012-06-07 DIAGNOSIS — M2240 Chondromalacia patellae, unspecified knee: Secondary | ICD-10-CM | POA: Diagnosis not present

## 2012-06-07 DIAGNOSIS — IMO0002 Reserved for concepts with insufficient information to code with codable children: Secondary | ICD-10-CM | POA: Diagnosis not present

## 2012-06-09 DIAGNOSIS — IMO0002 Reserved for concepts with insufficient information to code with codable children: Secondary | ICD-10-CM | POA: Diagnosis not present

## 2012-06-09 DIAGNOSIS — M2240 Chondromalacia patellae, unspecified knee: Secondary | ICD-10-CM | POA: Diagnosis not present

## 2012-06-14 DIAGNOSIS — M2240 Chondromalacia patellae, unspecified knee: Secondary | ICD-10-CM | POA: Diagnosis not present

## 2012-06-14 DIAGNOSIS — IMO0002 Reserved for concepts with insufficient information to code with codable children: Secondary | ICD-10-CM | POA: Diagnosis not present

## 2012-06-16 DIAGNOSIS — M2240 Chondromalacia patellae, unspecified knee: Secondary | ICD-10-CM | POA: Diagnosis not present

## 2012-06-16 DIAGNOSIS — IMO0002 Reserved for concepts with insufficient information to code with codable children: Secondary | ICD-10-CM | POA: Diagnosis not present

## 2012-06-29 DIAGNOSIS — M2240 Chondromalacia patellae, unspecified knee: Secondary | ICD-10-CM | POA: Diagnosis not present

## 2012-06-29 DIAGNOSIS — IMO0002 Reserved for concepts with insufficient information to code with codable children: Secondary | ICD-10-CM | POA: Diagnosis not present

## 2012-08-26 DIAGNOSIS — E782 Mixed hyperlipidemia: Secondary | ICD-10-CM | POA: Diagnosis not present

## 2012-08-26 DIAGNOSIS — E538 Deficiency of other specified B group vitamins: Secondary | ICD-10-CM | POA: Diagnosis not present

## 2012-08-26 DIAGNOSIS — M79609 Pain in unspecified limb: Secondary | ICD-10-CM | POA: Diagnosis not present

## 2012-08-26 DIAGNOSIS — G4700 Insomnia, unspecified: Secondary | ICD-10-CM | POA: Diagnosis not present

## 2012-09-21 DIAGNOSIS — Z23 Encounter for immunization: Secondary | ICD-10-CM | POA: Diagnosis not present

## 2012-10-05 DIAGNOSIS — Z79899 Other long term (current) drug therapy: Secondary | ICD-10-CM | POA: Diagnosis not present

## 2012-10-05 DIAGNOSIS — R630 Anorexia: Secondary | ICD-10-CM | POA: Diagnosis not present

## 2012-10-05 DIAGNOSIS — K6289 Other specified diseases of anus and rectum: Secondary | ICD-10-CM | POA: Diagnosis not present

## 2012-10-05 DIAGNOSIS — R350 Frequency of micturition: Secondary | ICD-10-CM | POA: Diagnosis not present

## 2012-10-05 DIAGNOSIS — R5382 Chronic fatigue, unspecified: Secondary | ICD-10-CM | POA: Diagnosis not present

## 2012-10-05 DIAGNOSIS — G4700 Insomnia, unspecified: Secondary | ICD-10-CM | POA: Diagnosis not present

## 2012-10-05 DIAGNOSIS — K219 Gastro-esophageal reflux disease without esophagitis: Secondary | ICD-10-CM | POA: Diagnosis not present

## 2012-10-05 DIAGNOSIS — R634 Abnormal weight loss: Secondary | ICD-10-CM | POA: Diagnosis not present

## 2012-10-15 DIAGNOSIS — R110 Nausea: Secondary | ICD-10-CM | POA: Diagnosis not present

## 2012-10-15 DIAGNOSIS — R5382 Chronic fatigue, unspecified: Secondary | ICD-10-CM | POA: Diagnosis not present

## 2012-10-15 DIAGNOSIS — R630 Anorexia: Secondary | ICD-10-CM | POA: Diagnosis not present

## 2012-10-17 ENCOUNTER — Emergency Department (HOSPITAL_COMMUNITY): Payer: Medicare Other

## 2012-10-17 ENCOUNTER — Observation Stay (HOSPITAL_COMMUNITY)
Admission: EM | Admit: 2012-10-17 | Discharge: 2012-10-22 | Disposition: A | Discharge status: Home / Self Care | Payer: Medicare Other | Attending: Internal Medicine | Admitting: Internal Medicine

## 2012-10-17 DIAGNOSIS — D131 Benign neoplasm of stomach: Secondary | ICD-10-CM | POA: Diagnosis not present

## 2012-10-17 DIAGNOSIS — E039 Hypothyroidism, unspecified: Secondary | ICD-10-CM | POA: Diagnosis present

## 2012-10-17 DIAGNOSIS — R531 Weakness: Secondary | ICD-10-CM | POA: Diagnosis present

## 2012-10-17 DIAGNOSIS — E871 Hypo-osmolality and hyponatremia: Secondary | ICD-10-CM | POA: Diagnosis not present

## 2012-10-17 DIAGNOSIS — R51 Headache: Secondary | ICD-10-CM | POA: Insufficient documentation

## 2012-10-17 DIAGNOSIS — G4700 Insomnia, unspecified: Secondary | ICD-10-CM | POA: Diagnosis not present

## 2012-10-17 DIAGNOSIS — E876 Hypokalemia: Secondary | ICD-10-CM | POA: Diagnosis not present

## 2012-10-17 DIAGNOSIS — F411 Generalized anxiety disorder: Secondary | ICD-10-CM | POA: Diagnosis not present

## 2012-10-17 DIAGNOSIS — R110 Nausea: Secondary | ICD-10-CM | POA: Insufficient documentation

## 2012-10-17 DIAGNOSIS — F419 Anxiety disorder, unspecified: Secondary | ICD-10-CM | POA: Diagnosis present

## 2012-10-17 DIAGNOSIS — F329 Major depressive disorder, single episode, unspecified: Secondary | ICD-10-CM | POA: Insufficient documentation

## 2012-10-17 DIAGNOSIS — E785 Hyperlipidemia, unspecified: Secondary | ICD-10-CM | POA: Diagnosis not present

## 2012-10-17 DIAGNOSIS — R519 Headache, unspecified: Secondary | ICD-10-CM | POA: Diagnosis present

## 2012-10-17 DIAGNOSIS — R1310 Dysphagia, unspecified: Secondary | ICD-10-CM | POA: Diagnosis not present

## 2012-10-17 DIAGNOSIS — Z79899 Other long term (current) drug therapy: Secondary | ICD-10-CM | POA: Diagnosis not present

## 2012-10-17 DIAGNOSIS — M542 Cervicalgia: Secondary | ICD-10-CM | POA: Diagnosis not present

## 2012-10-17 DIAGNOSIS — Z79890 Hormone replacement therapy (postmenopausal): Secondary | ICD-10-CM | POA: Insufficient documentation

## 2012-10-17 DIAGNOSIS — R5383 Other fatigue: Secondary | ICD-10-CM | POA: Diagnosis present

## 2012-10-17 DIAGNOSIS — R079 Chest pain, unspecified: Secondary | ICD-10-CM | POA: Diagnosis not present

## 2012-10-17 DIAGNOSIS — I6529 Occlusion and stenosis of unspecified carotid artery: Secondary | ICD-10-CM | POA: Insufficient documentation

## 2012-10-17 DIAGNOSIS — K219 Gastro-esophageal reflux disease without esophagitis: Secondary | ICD-10-CM | POA: Diagnosis not present

## 2012-10-17 DIAGNOSIS — I671 Cerebral aneurysm, nonruptured: Secondary | ICD-10-CM | POA: Insufficient documentation

## 2012-10-17 DIAGNOSIS — H903 Sensorineural hearing loss, bilateral: Secondary | ICD-10-CM | POA: Diagnosis not present

## 2012-10-17 DIAGNOSIS — M79609 Pain in unspecified limb: Secondary | ICD-10-CM | POA: Insufficient documentation

## 2012-10-17 DIAGNOSIS — R404 Transient alteration of awareness: Secondary | ICD-10-CM | POA: Diagnosis not present

## 2012-10-17 DIAGNOSIS — K3184 Gastroparesis: Secondary | ICD-10-CM | POA: Diagnosis not present

## 2012-10-17 DIAGNOSIS — R262 Difficulty in walking, not elsewhere classified: Secondary | ICD-10-CM | POA: Diagnosis not present

## 2012-10-17 DIAGNOSIS — R5381 Other malaise: Principal | ICD-10-CM | POA: Insufficient documentation

## 2012-10-17 DIAGNOSIS — F458 Other somatoform disorders: Secondary | ICD-10-CM | POA: Diagnosis present

## 2012-10-17 DIAGNOSIS — R634 Abnormal weight loss: Secondary | ICD-10-CM | POA: Insufficient documentation

## 2012-10-17 DIAGNOSIS — M79642 Pain in left hand: Secondary | ICD-10-CM | POA: Diagnosis not present

## 2012-10-17 DIAGNOSIS — I10 Essential (primary) hypertension: Secondary | ICD-10-CM | POA: Insufficient documentation

## 2012-10-17 DIAGNOSIS — R064 Hyperventilation: Secondary | ICD-10-CM | POA: Diagnosis not present

## 2012-10-17 DIAGNOSIS — K2980 Duodenitis without bleeding: Secondary | ICD-10-CM | POA: Diagnosis not present

## 2012-10-17 DIAGNOSIS — F3289 Other specified depressive episodes: Secondary | ICD-10-CM | POA: Insufficient documentation

## 2012-10-17 HISTORY — DX: Hypokalemia: E87.6

## 2012-10-17 HISTORY — DX: Hypo-osmolality and hyponatremia: E87.1

## 2012-10-17 LAB — CBC WITH DIFFERENTIAL/PLATELET
Eosinophils Absolute: 0.1 10*3/uL (ref 0.0–0.7)
Eosinophils Relative: 1 % (ref 0–5)
HCT: 36.3 % (ref 36.0–46.0)
Hemoglobin: 12.9 g/dL (ref 12.0–15.0)
Lymphs Abs: 2.9 10*3/uL (ref 0.7–4.0)
MCH: 31.2 pg (ref 26.0–34.0)
MCHC: 35.5 g/dL (ref 30.0–36.0)
MCV: 87.9 fL (ref 78.0–100.0)
Monocytes Absolute: 0.7 10*3/uL (ref 0.1–1.0)
Monocytes Relative: 9 % (ref 3–12)
RBC: 4.13 MIL/uL (ref 3.87–5.11)

## 2012-10-17 LAB — BLOOD GAS, VENOUS
Acid-Base Excess: 0.9 mmol/L (ref 0.0–2.0)
Bicarbonate: 20.5 mEq/L (ref 20.0–24.0)
O2 Saturation: 87.6 %
pO2, Ven: 43.1 mmHg (ref 30.0–45.0)

## 2012-10-17 LAB — COMPREHENSIVE METABOLIC PANEL
Alkaline Phosphatase: 52 U/L (ref 39–117)
BUN: 13 mg/dL (ref 6–23)
Calcium: 9.1 mg/dL (ref 8.4–10.5)
Creatinine, Ser: 0.69 mg/dL (ref 0.50–1.10)
GFR calc Af Amer: >90 mL/min (ref >90)
Glucose, Bld: 97 mg/dL (ref 70–99)
Potassium: 3 mEq/L — ABNORMAL LOW (ref 3.5–5.1)
Total Protein: 6.8 g/dL (ref 6.0–8.3)

## 2012-10-17 LAB — URINALYSIS, ROUTINE W REFLEX MICROSCOPIC
Hgb urine dipstick: NEGATIVE
Protein, ur: NEGATIVE mg/dL
Urobilinogen, UA: 0.2 mg/dL (ref 0.0–1.0)

## 2012-10-17 LAB — LACTIC ACID, PLASMA: Lactic Acid, Venous: 3.1 mmol/L — ABNORMAL HIGH (ref 0.5–2.2)

## 2012-10-17 LAB — TROPONIN I: Troponin I: <0.3 ng/mL (ref <0.30)

## 2012-10-17 LAB — LIPASE, BLOOD: Lipase: 27 U/L (ref 11–59)

## 2012-10-17 LAB — TSH: TSH: 0.362 u[IU]/mL (ref 0.350–4.500)

## 2012-10-17 MED ORDER — LORAZEPAM 2 MG/ML IJ SOLN
0.5000 mg | Freq: Once | INTRAMUSCULAR | Status: AC
  Administered 2012-10-17: 0.5 mg via INTRAVENOUS
  Filled 2012-10-17: qty 1

## 2012-10-17 MED ORDER — PNEUMOCOCCAL VAC POLYVALENT 25 MCG/0.5ML IJ INJ
0.5000 mL | INJECTION | INTRAMUSCULAR | Status: AC
  Filled 2012-10-17: qty 0.5

## 2012-10-17 MED ORDER — POTASSIUM CHLORIDE 10 MEQ/100ML IV SOLN
10.0000 meq | INTRAVENOUS | Status: DC
  Administered 2012-10-17: 10 meq via INTRAVENOUS
  Filled 2012-10-17 (×3): qty 100

## 2012-10-17 MED ORDER — ZOLPIDEM TARTRATE 10 MG PO TABS: 10.0000 mg | ORAL_TABLET | Freq: Every evening | ORAL | Status: DC | PRN

## 2012-10-17 MED ORDER — ONDANSETRON HCL 4 MG PO TABS: 4.0000 mg | ORAL_TABLET | Freq: Four times a day (QID) | ORAL | Status: DC | PRN

## 2012-10-17 MED ORDER — FLUOXETINE HCL 20 MG PO CAPS
40.0000 mg | ORAL_CAPSULE | Freq: Every day | ORAL | Status: DC
  Administered 2012-10-18 – 2012-10-22 (×5): 40 mg via ORAL
  Filled 2012-10-17 (×5): qty 2

## 2012-10-17 MED ORDER — SODIUM CHLORIDE 0.9 % IV BOLUS (SEPSIS)
1000.0000 mL | Freq: Once | INTRAVENOUS | Status: AC
  Administered 2012-10-17: 1000 mL via INTRAVENOUS

## 2012-10-17 MED ORDER — ONDANSETRON 8 MG PO TBDP
ORAL_TABLET | ORAL | Status: AC
  Administered 2012-10-17: 8 mg via ORAL
  Filled 2012-10-17: qty 1

## 2012-10-17 MED ORDER — ESTROGENS CONJUGATED 0.625 MG PO TABS
0.6250 mg | ORAL_TABLET | Freq: Every day | ORAL | Status: DC
  Administered 2012-10-18 – 2012-10-22 (×5): 0.625 mg via ORAL
  Filled 2012-10-17 (×5): qty 1

## 2012-10-17 MED ORDER — ONDANSETRON 8 MG PO TBDP
8.0000 mg | ORAL_TABLET | Freq: Once | ORAL | Status: AC
  Administered 2012-10-17: 8 mg via ORAL

## 2012-10-17 MED ORDER — LEVOTHYROXINE SODIUM 100 MCG PO TABS
100.0000 ug | ORAL_TABLET | Freq: Every day | ORAL | Status: DC
  Administered 2012-10-17 – 2012-10-22 (×5): 100 ug via ORAL
  Filled 2012-10-17 (×6): qty 1

## 2012-10-17 MED ORDER — POTASSIUM CHLORIDE CRYS ER 20 MEQ PO TBCR: 40.0000 meq | EXTENDED_RELEASE_TABLET | Freq: Once | ORAL | Status: DC

## 2012-10-17 MED ORDER — ACETAMINOPHEN 325 MG PO TABS
650.0000 mg | ORAL_TABLET | Freq: Four times a day (QID) | ORAL | Status: DC | PRN
  Administered 2012-10-17 – 2012-10-19 (×2): 650 mg via ORAL
  Filled 2012-10-17 (×2): qty 2

## 2012-10-17 MED ORDER — ENOXAPARIN SODIUM 40 MG/0.4ML ~~LOC~~ SOLN
40.0000 mg | SUBCUTANEOUS | Status: DC
  Administered 2012-10-17 – 2012-10-21 (×5): 40 mg via SUBCUTANEOUS
  Filled 2012-10-17 (×6): qty 0.4

## 2012-10-17 MED ORDER — POTASSIUM CHLORIDE IN NACL 20-0.9 MEQ/L-% IV SOLN
INTRAVENOUS | Status: DC
  Administered 2012-10-17 – 2012-10-19 (×4): via INTRAVENOUS
  Filled 2012-10-17 (×6): qty 1000

## 2012-10-17 MED ORDER — IRBESARTAN 75 MG PO TABS
75.0000 mg | ORAL_TABLET | Freq: Every day | ORAL | Status: DC
  Administered 2012-10-18 – 2012-10-22 (×5): 75 mg via ORAL
  Filled 2012-10-17 (×5): qty 1

## 2012-10-17 MED ORDER — AMLODIPINE BESYLATE 5 MG PO TABS
5.0000 mg | ORAL_TABLET | Freq: Every day | ORAL | Status: DC
  Administered 2012-10-18 – 2012-10-22 (×5): 5 mg via ORAL
  Filled 2012-10-17 (×5): qty 1

## 2012-10-17 MED ORDER — ONDANSETRON HCL 4 MG/2ML IJ SOLN: 4.0000 mg | Freq: Four times a day (QID) | INTRAMUSCULAR | Status: DC | PRN

## 2012-10-17 MED ORDER — ZOLPIDEM TARTRATE 5 MG PO TABS
5.0000 mg | ORAL_TABLET | Freq: Every evening | ORAL | Status: DC | PRN
  Administered 2012-10-17: 5 mg via ORAL
  Filled 2012-10-17: qty 1

## 2012-10-17 MED ORDER — ACETAMINOPHEN 650 MG RE SUPP
650.0000 mg | Freq: Four times a day (QID) | RECTAL | Status: DC | PRN
  Administered 2012-10-21: 650 mg via RECTAL
  Filled 2012-10-17: qty 1

## 2012-10-17 MED ORDER — SENNOSIDES-DOCUSATE SODIUM 8.6-50 MG PO TABS
1.0000 | ORAL_TABLET | Freq: Every evening | ORAL | Status: DC | PRN
  Filled 2012-10-17: qty 1

## 2012-10-17 MED ORDER — VITAMIN D3 25 MCG (1000 UNIT) PO TABS
1000.0000 [IU] | ORAL_TABLET | Freq: Every day | ORAL | Status: DC
  Administered 2012-10-18 – 2012-10-22 (×5): 1000 [IU] via ORAL
  Filled 2012-10-17 (×5): qty 1

## 2012-10-17 NOTE — ED Notes (Signed)
ZOX:[id number]<AV> Expected date:10/17/12<BR> Expected time: 1:23 PM<BR> Means of arrival:Ambulance<BR> Comments:<BR> General weakness

## 2012-10-17 NOTE — ED Provider Notes (Signed)
History     CSN: [id number]  Arrival date & time 10/17/12  1326   First MD Initiated Contact with Patient 10/17/12 1351      Chief Complaint  Patient presents with  . Weakness     HPI  The patient presents with multiple complaints.  It seems as though her symptoms over the past few months.  Since onset she has had progressive weakness diffusely.  She also complains of nausea with no vomiting or diarrhea.  She denies fevers or chills.  She has a history of hypothyroidism, as well as anxiety and depression.  As her symptoms have progressed she has seen her physician.  She has had her Synthroid stopped, with no change in the progression of symptoms. History of present illness is per the patient's family.  The patient acknowledges a conversation, endorses correct answers, but speaks minimally.  Past Medical History  Diagnosis Date  . PONV (postoperative nausea and vomiting)   . Hypertension   . Hyperlipemia   . Hypothyroidism   . GERD (gastroesophageal reflux disease)   . Arthritis   . Depression   . Deaf, left     Past Surgical History  Procedure Date  . Abdominal hysterectomy   . Cholecystectomy   . Tonsillectomy   . Appendectomy   . Thyroidectomy, partial   . Ercp   . Colonoscopy   . Eye surgery     cataracts  . Knee arthroscopy 05/11/2012    Procedure: ARTHROSCOPY KNEE;  Surgeon: Velna Ochs, MD;  Location: [facility];  Service: Orthopedics;  Laterality: Right;  left knee medial menisectomy and chondroplasty    No family history on file.  History  Substance Use Topics  . Smoking status: Never Smoker   . Smokeless tobacco: Not on file  . Alcohol Use: No    OB History    Grav Para Term Preterm Abortions TAB SAB Ect Mult Living                  Review of Systems  Constitutional:       Per HPI, otherwise negative  HENT:       Per HPI, otherwise negative  Eyes: Negative.   Respiratory:       Per HPI, otherwise negative    Cardiovascular:       Per HPI, otherwise negative  Gastrointestinal: Negative for vomiting.  Genitourinary: Negative.   Musculoskeletal:       Per HPI, otherwise negative  Skin: Negative.   Neurological: Negative for syncope.    Allergies  Morphine and related  Home Medications   Current Outpatient Rx  Name Route Sig Dispense Refill  . AMLODIPINE BESYLATE 5 MG PO TABS Oral Take 5 mg by mouth daily.    Marland Kitchen VITAMIN D 1000 UNITS PO TABS Oral Take 1,000 Units by mouth daily.    Marland Kitchen ESTROGENS CONJUGATED 0.625 MG PO TABS Oral Take 0.625 mg by mouth daily.     Marland Kitchen FLUOXETINE HCL 40 MG PO CAPS Oral Take 40 mg by mouth daily.    Marland Kitchen LEVOTHYROXINE SODIUM 100 MCG PO TABS Oral Take 100 mcg by mouth daily.    Marland Kitchen ALIGN 4 MG PO CAPS Oral Take 1 capsule by mouth daily.    . TELMISARTAN 80 MG PO TABS Oral Take 80 mg by mouth daily.    Marland Kitchen ZOLPIDEM TARTRATE 10 MG PO TABS Oral Take 10 mg by mouth at bedtime as needed. For sleep.  BP 142/66  Pulse 74  Temp 98.2 F (36.8 C) (Oral)  Resp 18  SpO2 100%  Physical Exam  Constitutional: She is oriented to person, place, and time. She appears distressed.       On well-appearing female resting in bed with a towel over her face  HENT:  Head: Normocephalic and atraumatic.  Eyes: Conjunctivae normal and EOM are normal. Pupils are equal, round, and reactive to light.  Neck: No tracheal deviation present.  Cardiovascular: Normal rate and regular rhythm.   Pulmonary/Chest: Effort normal and breath sounds normal. No stridor.  Abdominal: She exhibits no distension.  Musculoskeletal: She exhibits no edema and no tenderness.  Neurological: She is alert and oriented to person, place, and time. No cranial nerve deficit. Coordination normal.  Skin: Skin is warm. She is diaphoretic.  Psychiatric: Thought content normal. Her mood appears anxious. Her speech is delayed. She is slowed. Cognition and memory are normal.    ED Course  Procedures (including critical  care time)  Labs Reviewed  COMPREHENSIVE METABOLIC PANEL - Abnormal; Notable for the following:    Sodium 129 (*)     Potassium 3.0 (*)     Chloride 95 (*)     GFR calc non Af Amer 80 (*)     All other components within normal limits  LACTIC ACID, PLASMA - Abnormal; Notable for the following:    Lactic Acid, Venous 3.1 (*)     All other components within normal limits  BLOOD GAS, VENOUS - Abnormal; Notable for the following:    pH, Ven 7.604 (*)     pCO2, Ven 20.5 (*)     All other components within normal limits  CBC WITH DIFFERENTIAL  LIPASE, BLOOD  TROPONIN I  URINALYSIS, ROUTINE W REFLEX MICROSCOPIC   Dg Chest 2 View  10/17/2012  *RADIOLOGY REPORT*  Clinical Data: 78 year old female with chest pain.  CHEST - 2 VIEW  Comparison: None  Findings: Upper limits normal heart size noted. There is no evidence of focal airspace disease, pulmonary edema, suspicious pulmonary nodule/mass, pleural effusion, or pneumothorax. No acute bony abnormalities are identified.  IMPRESSION: No evidence of acute cardiopulmonary disease.   Original Report Authenticated By: Rosendo Gros, M.D.      No diagnosis found.  Cardiac 80 sinus rhythm normal Pulse ox 100% room air normal   Date: 10/17/2012  Rate: 77  Rhythm: normal sinus rhythm  QRS Axis: normal  Intervals: normal  ST/T Wave abnormalities: nonspecific T wave changes  Conduction Disutrbances:none  Narrative Interpretation:   Old EKG Reviewed: unchanged BORDERLINE   MDM  This elderly female presents with multiple generalized complaints.  On exam she is on well-appearing, anxious, speaking minimally.  The patient has a blood gas notable for alkalosis suggesting hyperventilation.  The remainder the patient's labs are notable for mild hyponatremia, hypokalemia, hypochloremia.  The patient's x-ray did not demonstrate pneumonia and her head CT was unremakable.  Given the lactic acidosis, electrolyte abnormalities, she will be admitted for  further E/M.     Gerhard Munch, MD 10/17/12 1651

## 2012-10-17 NOTE — H&P (Signed)
[facility]          History and Physical    PCP:   GATES,ROBERT NEVILL, MD   Chief Complaint:  Weakness, fatigue, nausea  HPI: 78 y/o woman with h/o HTN and hypothyroidism presents with an about 1 month h/o generalized weakness and fatigue. There have been some changes to her synthroid dose recently. Daughters are very concerned because at baseline she is able to care for herself and is completely independent in her ADLs. Now she has difficulty even walking. She has not had fever. Has been having difficulty sleeping and some mild diarrhea. They state she has been very emotional and crying frequently. She is found to be slightly hyponatremic and hypokalemic and with an elevated lactic acid and we have been asked to admit her for further evaluation and management.  Allergies:   Allergies  Allergen Reactions  . Morphine And Related Hives      Past Medical History  Diagnosis Date  . PONV (postoperative nausea and vomiting)   . Hypertension   . Hyperlipemia   . Hypothyroidism   . GERD (gastroesophageal reflux disease)   . Arthritis   . Depression   . Deaf, left     Past Surgical History  Procedure Date  . Abdominal hysterectomy   . Cholecystectomy   . Tonsillectomy   . Appendectomy   . Thyroidectomy, partial   . Ercp   . Colonoscopy   . Eye surgery     cataracts  . Knee arthroscopy 05/11/2012    Procedure: ARTHROSCOPY KNEE;  Surgeon: Velna Ochs, MD;  Location: [facility];  Service: Orthopedics;  Laterality: Right;  left knee medial menisectomy and chondroplasty    Prior to Admission medications   Medication Sig Start Date End Date Taking? Authorizing Provider  amLODipine (NORVASC) 5 MG tablet Take 5 mg by mouth daily.   Yes Historical Provider, MD  cholecalciferol (VITAMIN D) 1000 UNITS tablet Take 1,000 Units by mouth daily.   Yes Historical Provider, MD  estrogens, conjugated, (PREMARIN) 0.625 MG tablet Take 0.625 mg by mouth  daily.    Yes Historical Provider, MD  FLUoxetine (PROZAC) 40 MG capsule Take 40 mg by mouth daily.   Yes Historical Provider, MD  levothyroxine (SYNTHROID, LEVOTHROID) 100 MCG tablet Take 100 mcg by mouth daily.   Yes Historical Provider, MD  Probiotic Product (ALIGN) 4 MG CAPS Take 1 capsule by mouth daily.   Yes Historical Provider, MD  telmisartan (MICARDIS) 80 MG tablet Take 80 mg by mouth daily.   Yes Historical Provider, MD  zolpidem (AMBIEN) 10 MG tablet Take 10 mg by mouth at bedtime as needed. For sleep.   Yes Historical Provider, MD    Social History:  reports that she has never smoked. She does not have any smokeless tobacco history on file. She reports that she does not drink alcohol or use illicit drugs.  No family history on file.  Review of Systems:  Constitutional: Denies fever, chills. HEENT: Denies photophobia, eye pain, redness, hearing loss, ear pain, congestion, sore throat, rhinorrhea, sneezing, mouth sores, trouble swallowing, neck pain, neck stiffness and tinnitus.   Respiratory: Denies SOB, DOE, cough, chest tightness,  and wheezing.   Cardiovascular: Denies chest pain, palpitations and leg swelling.  Gastrointestinal: Denies constipation, blood in stool. Genitourinary: Denies dysuria, urgency, frequency, hematuria, flank pain and difficulty urinating.  Musculoskeletal: Denies myalgias, back pain, joint swelling, arthralgias and gait problem.  Skin: Denies pallor, rash and wound.  Neurological: Denies dizziness,  seizures, syncope, weakness, light-headedness, numbness and headaches.  Hematological: Denies adenopathy. Easy bruising, personal or family bleeding history  Psychiatric/Behavioral: Denies suicidal ideation, .  Physical Exam: Blood pressure 142/66, pulse 74, temperature 98.2 F (36.8 C), temperature source Oral, resp. rate 18, SpO2 100.00%. Gen: AAOx3, NAD currently. Cooperative with exam. HEENT: [place]/AT/PERRL/EOMI/dry mucous membranes. Neck: supple, no  JVD, no LAD, no bruits, no goiter. CV: RRR, no M/R/G. Lungs: CTA B. Abd: S/NT/ND/+BS/no masses or organomegaly noted. Ext: no C/C/E, +pedal pulses. Neuro: Exam is non-focal.  Labs on Admission:  Results for orders placed during the hospital encounter of 10/17/12 (from the past 48 hour(s))  CBC WITH DIFFERENTIAL     Status: Normal   Collection Time   10/17/12  2:25 PM      Component Value Range Comment   WBC 7.7  4.0 - 10.5 K/uL    RBC 4.13  3.87 - 5.11 MIL/uL    Hemoglobin 12.9  12.0 - 15.0 g/dL    HCT 16.1  09.6 - 04.5 %    MCV 87.9  78.0 - 100.0 fL    MCH 31.2  26.0 - 34.0 pg    MCHC 35.5  30.0 - 36.0 g/dL    RDW 40.9  81.1 - 91.4 %    Platelets 291  150 - 400 K/uL    Neutrophils Relative 52  43 - 77 %    Neutro Abs 3.9  1.7 - 7.7 K/uL    Lymphocytes Relative 38  12 - 46 %    Lymphs Abs 2.9  0.7 - 4.0 K/uL    Monocytes Relative 9  3 - 12 %    Monocytes Absolute 0.7  0.1 - 1.0 K/uL    Eosinophils Relative 1  0 - 5 %    Eosinophils Absolute 0.1  0.0 - 0.7 K/uL    Basophils Relative 0  0 - 1 %    Basophils Absolute 0.0  0.0 - 0.1 K/uL   COMPREHENSIVE METABOLIC PANEL     Status: Abnormal   Collection Time   10/17/12  2:25 PM      Component Value Range Comment   Sodium 129 (*) 135 - 145 mEq/L    Potassium 3.0 (*) 3.5 - 5.1 mEq/L    Chloride 95 (*) 96 - 112 mEq/L    CO2 19  19 - 32 mEq/L    Glucose, Bld 97  70 - 99 mg/dL    BUN 13  6 - 23 mg/dL    Creatinine, Ser 7.82  0.50 - 1.10 mg/dL    Calcium 9.1  8.4 - 95.6 mg/dL    Total Protein 6.8  6.0 - 8.3 g/dL    Albumin 3.5  3.5 - 5.2 g/dL    AST 17  0 - 37 U/L    ALT 15  0 - 35 U/L    Alkaline Phosphatase 52  39 - 117 U/L    Total Bilirubin 0.3  0.3 - 1.2 mg/dL    GFR calc non Af Amer 80 (*) >90 mL/min    GFR calc Af Amer >90  >90 mL/min   LIPASE, BLOOD     Status: Normal   Collection Time   10/17/12  2:25 PM      Component Value Range Comment   Lipase 27  11 - 59 U/L   TROPONIN I     Status: Normal   Collection  Time   10/17/12  2:25 PM      Component Value  Range Comment   Troponin I <0.30  <0.30 ng/mL   LACTIC ACID, PLASMA     Status: Abnormal   Collection Time   10/17/12  2:25 PM      Component Value Range Comment   Lactic Acid, Venous 3.1 (*) 0.5 - 2.2 mmol/L   BLOOD GAS, VENOUS     Status: Abnormal   Collection Time   10/17/12  2:40 PM      Component Value Range Comment   FIO2 0.21      pH, Ven 7.604 (*) 7.250 - 7.300    pCO2, Ven 20.5 (*) 45.0 - 50.0 mmHg    pO2, Ven 43.1  30.0 - 45.0 mmHg    Bicarbonate 20.5  20.0 - 24.0 mEq/L    TCO2 17.4  0 - 100 mmol/L    Acid-Base Excess 0.9  0.0 - 2.0 mmol/L    O2 Saturation 87.6      Patient temperature 98.6      Collection site COLLECTED BY LABORATORY      Drawn by COLLECTED BY LABORATORY      Sample type VEIN     URINALYSIS, ROUTINE W REFLEX MICROSCOPIC     Status: Normal   Collection Time   10/17/12  3:04 PM      Component Value Range Comment   Color, Urine YELLOW  YELLOW    APPearance CLEAR  CLEAR    Specific Gravity, Urine 1.009  1.005 - 1.030    pH 7.5  5.0 - 8.0    Glucose, UA NEGATIVE  NEGATIVE mg/dL    Hgb urine dipstick NEGATIVE  NEGATIVE    Bilirubin Urine NEGATIVE  NEGATIVE    Ketones, ur NEGATIVE  NEGATIVE mg/dL    Protein, ur NEGATIVE  NEGATIVE mg/dL    Urobilinogen, UA 0.2  0.0 - 1.0 mg/dL    Nitrite NEGATIVE  NEGATIVE    Leukocytes, UA NEGATIVE  NEGATIVE MICROSCOPIC NOT DONE ON URINES WITH NEGATIVE PROTEIN, BLOOD, LEUKOCYTES, NITRITE, OR GLUCOSE <1000 mg/dL.  POTASSIUM     Status: Abnormal   Collection Time   10/17/12  5:10 PM      Component Value Range Comment   Potassium 3.0 (*) 3.5 - 5.1 mEq/L     Radiological Exams on Admission: Dg Chest 2 View  10/17/2012  *RADIOLOGY REPORT*  Clinical Data: 78 year old female with chest pain.  CHEST - 2 VIEW  Comparison: None  Findings: Upper limits normal heart size noted. There is no evidence of focal airspace disease, pulmonary edema, suspicious pulmonary nodule/mass,  pleural effusion, or pneumothorax. No acute bony abnormalities are identified.  IMPRESSION: No evidence of acute cardiopulmonary disease.   Original Report Authenticated By: Rosendo Gros, M.D.    Ct Head Wo Contrast  10/17/2012  *RADIOLOGY REPORT*  Clinical Data: Weakness for several months.  Nausea.  History of hypertension and depression.  CT HEAD WITHOUT CONTRAST  Technique:  Contiguous axial images were obtained from the base of the skull through the vertex without contrast.  Comparison: Head CT 01/05/2012.  Findings: There is no evidence of acute intracranial hemorrhage, mass lesion, brain edema or extra-axial fluid collection.  The ventricles and subarachnoid spaces are appropriately sized for age. There is no CT evidence of acute cortical infarction.  Chronic small vessel ischemic changes in the periventricular white matter and basal ganglia are stable.  The visualized paranasal sinuses are clear. The calvarium is intact.  IMPRESSION: Stable chronic small vessel ischemic changes.  No acute intracranial findings demonstrated.  Original Report Authenticated By: Gerrianne Scale, M.D.     Assessment/Plan Principal Problem:  *Weakness Active Problems:  Hyponatremia  Hypokalemia  Hypothyroidism  Fatigue    Generalized Weakness/Fatigue -Etiology unclear. -She could be hypothyroid: Check TSH/T4/T3. -Will also check B12 and RPR as potential etiologies. -Her Na of 129 could be contributing. -She also has a h/o depression, and with her recent mood changes, I wonder if this could be the cause. We need to r/o organic causes first. -Doubt CVA as she has a completely non-focal neuro exam. CT Head was negative and this has been going on for a month. Do not see a reason to order MRi at this point in time. -Will get PT/OT to assess her for further recommendations.  Hyponatremia -Give IVF. -Recheck in am.  Hypokalemia -Check Mag level. -Replete PO.  Hypothyroidism -Continue current  synthroid dose. -Check TSH/T4/T3.  DVT Prophylaxis -Lovenox.   Dr. Kevan Ny will assume care in am.   Time Spent on Admission: 70 minutes.  Chaya Jan [facility] Pager: [phone] 10/17/2012, 6:21 PM

## 2012-10-17 NOTE — ED Notes (Signed)
Per [facility], pt has been feeling weak for a few months. Pt reports that her MD is unaware of etiology. Pt c/o nausea but denies vomiting. Zofran 4mg  given by EMS enroute. Pt tearful/emotional/anxious. EKG done enroute, WNL. Pt reports that her MD recommended that she visit the hospital on Friday, but she declined to do so.

## 2012-10-18 ENCOUNTER — Observation Stay (HOSPITAL_COMMUNITY): Payer: Medicare Other

## 2012-10-18 ENCOUNTER — Encounter (HOSPITAL_COMMUNITY): Payer: Self-pay | Admitting: *Deleted

## 2012-10-18 DIAGNOSIS — I671 Cerebral aneurysm, nonruptured: Secondary | ICD-10-CM | POA: Diagnosis not present

## 2012-10-18 DIAGNOSIS — R51 Headache: Secondary | ICD-10-CM | POA: Diagnosis present

## 2012-10-18 DIAGNOSIS — R0989 Other specified symptoms and signs involving the circulatory and respiratory systems: Secondary | ICD-10-CM

## 2012-10-18 DIAGNOSIS — I10 Essential (primary) hypertension: Secondary | ICD-10-CM | POA: Diagnosis present

## 2012-10-18 DIAGNOSIS — F458 Other somatoform disorders: Secondary | ICD-10-CM | POA: Diagnosis present

## 2012-10-18 DIAGNOSIS — R519 Headache, unspecified: Secondary | ICD-10-CM | POA: Diagnosis present

## 2012-10-18 DIAGNOSIS — F419 Anxiety disorder, unspecified: Secondary | ICD-10-CM | POA: Diagnosis present

## 2012-10-18 DIAGNOSIS — G4700 Insomnia, unspecified: Secondary | ICD-10-CM | POA: Diagnosis present

## 2012-10-18 DIAGNOSIS — E785 Hyperlipidemia, unspecified: Secondary | ICD-10-CM | POA: Clinically undetermined

## 2012-10-18 DIAGNOSIS — M542 Cervicalgia: Secondary | ICD-10-CM | POA: Diagnosis not present

## 2012-10-18 DIAGNOSIS — K219 Gastro-esophageal reflux disease without esophagitis: Secondary | ICD-10-CM | POA: Diagnosis present

## 2012-10-18 DIAGNOSIS — H903 Sensorineural hearing loss, bilateral: Secondary | ICD-10-CM | POA: Diagnosis present

## 2012-10-18 LAB — COMPREHENSIVE METABOLIC PANEL
AST: 19 U/L (ref 0–37)
Albumin: 3.5 g/dL (ref 3.5–5.2)
Alkaline Phosphatase: 54 U/L (ref 39–117)
BUN: 9 mg/dL (ref 6–23)
Chloride: 99 mEq/L (ref 96–112)
Creatinine, Ser: 0.73 mg/dL (ref 0.50–1.10)
Potassium: 3.5 mEq/L (ref 3.5–5.1)
Total Bilirubin: 0.7 mg/dL (ref 0.3–1.2)
Total Protein: 6.8 g/dL (ref 6.0–8.3)

## 2012-10-18 LAB — BLOOD GAS, ARTERIAL
Acid-base deficit: 0.8 mmol/L (ref 0.0–2.0)
O2 Saturation: 98 %
Patient temperature: 98.6
TCO2: 18.6 mmol/L (ref 0–100)
pH, Arterial: 7.475 — ABNORMAL HIGH (ref 7.350–7.450)

## 2012-10-18 LAB — CBC
HCT: 37 % (ref 36.0–46.0)
MCH: 30.4 pg (ref 26.0–34.0)
MCV: 87.3 fL (ref 78.0–100.0)
Platelets: 306 10*3/uL (ref 150–400)
RBC: 4.24 MIL/uL (ref 3.87–5.11)
WBC: 8.2 10*3/uL (ref 4.0–10.5)

## 2012-10-18 LAB — VITAMIN B12: Vitamin B-12: 708 pg/mL (ref 211–911)

## 2012-10-18 LAB — RPR: RPR Ser Ql: NONREACTIVE

## 2012-10-18 MED ORDER — GADOBENATE DIMEGLUMINE 529 MG/ML IV SOLN
15.0000 mL | Freq: Once | INTRAVENOUS | Status: AC | PRN
  Administered 2012-10-18: 15 mL via INTRAVENOUS

## 2012-10-18 MED ORDER — ALPRAZOLAM 0.25 MG PO TABS
0.2500 mg | ORAL_TABLET | Freq: Three times a day (TID) | ORAL | Status: DC | PRN
  Administered 2012-10-18 – 2012-10-20 (×3): 0.25 mg via ORAL
  Filled 2012-10-18 (×3): qty 1

## 2012-10-18 MED ORDER — TEMAZEPAM 15 MG PO CAPS
15.0000 mg | ORAL_CAPSULE | Freq: Every evening | ORAL | Status: DC | PRN
  Administered 2012-10-18 – 2012-10-19 (×2): 15 mg via ORAL
  Filled 2012-10-18 (×2): qty 1

## 2012-10-18 MED ORDER — ZOLPIDEM TARTRATE 5 MG PO TABS
5.0000 mg | ORAL_TABLET | Freq: Every evening | ORAL | Status: DC | PRN
  Administered 2012-10-18 – 2012-10-19 (×2): 5 mg via ORAL
  Filled 2012-10-18 (×2): qty 1

## 2012-10-18 MED ORDER — DIPHENHYDRAMINE HCL 25 MG PO CAPS
25.0000 mg | ORAL_CAPSULE | Freq: Once | ORAL | Status: AC
  Administered 2012-10-18: 25 mg via ORAL
  Filled 2012-10-18: qty 1

## 2012-10-18 MED ORDER — IBUPROFEN 400 MG PO TABS
400.0000 mg | ORAL_TABLET | Freq: Once | ORAL | Status: AC
  Administered 2012-10-18: 400 mg via ORAL
  Filled 2012-10-18: qty 1

## 2012-10-18 MED ORDER — ZOLPIDEM TARTRATE 10 MG PO TABS
10.0000 mg | ORAL_TABLET | Freq: Every evening | ORAL | Status: DC | PRN
Start: 2012-10-18 — End: 2012-10-18

## 2012-10-18 NOTE — Progress Notes (Signed)
INITIAL ADULT NUTRITION ASSESSMENT Date: 10/18/2012   Time: 11:12 AM Reason for Assessment: Nutrition risk, consult  INTERVENTION: Anti-emetics per MD. Encouraged increased intake of bland foods when pt awake and alert. Will monitor.   ASSESSMENT: Female 78 y.o.  Dx: Weakness  Food/Nutrition Related Hx: Pt asleep, pt's family members in room, pt's daughter present at bedside. She reports pt is typically a good eater but for the past month her intake has declined to small portions of meals/snacks r/t nausea. Pt has had generalized weakness and fatigue during this time. She reports pt has lost weight, but is unsure how much, and reports pt's typical weight is between 145-150 pounds, however current weight is 153 pounds. CVA is being r/o with normal head CT, awaiting results of MRI and MRA of head and neck. MD notes pt with history of depression which may be cause of weakness/fatigue.   Hx:  Past Medical History  Diagnosis Date  . PONV (postoperative nausea and vomiting)   . Hypertension   . Hyperlipemia   . Hypothyroidism   . GERD (gastroesophageal reflux disease)   . Arthritis   . Depression   . Deaf, left    Related Meds:  Scheduled Meds:   . amLODipine  5 mg Oral Daily  . cholecalciferol  1,000 Units Oral Daily  . diphenhydrAMINE  25 mg Oral Once  . enoxaparin (LOVENOX) injection  40 mg Subcutaneous Q24H  . estrogens (conjugated)  0.625 mg Oral Daily  . FLUoxetine  40 mg Oral Daily  . ibuprofen  400 mg Oral Once  . irbesartan  75 mg Oral Daily  . levothyroxine  100 mcg Oral Daily  . LORazepam  0.5 mg Intravenous Once  . LORazepam  0.5 mg Intravenous Once  . ondansetron  8 mg Oral Once  . pneumococcal 23 valent vaccine  0.5 mL Intramuscular Tomorrow-1000  . potassium chloride  40 mEq Oral Once  . sodium chloride  1,000 mL Intravenous Once  . DISCONTD: potassium chloride  10 mEq Intravenous Q1 Hr x 3   Continuous Infusions:   . 0.9 % NaCl with KCl 20 mEq / L 75 mL/hr at  10/17/12 2152   PRN Meds:.acetaminophen, acetaminophen, ALPRAZolam, ondansetron (ZOFRAN) IV, ondansetron, senna-docusate, temazepam, zolpidem, DISCONTD: zolpidem  Ht: 5\' 5"  (165.1 cm)  Wt: 153 lb 3.5 oz (69.5 kg)  Ideal Wt: 125 lb % Ideal Wt: 122  Usual Wt: 145-150 lb per daughter's report % Usual Wt: 102-105  Body mass index is 25.50 kg/(m^2).    Labs:  CMP     Component Value Date/Time   NA 132* 10/18/2012 0418   K 3.5 10/18/2012 0418   CL 99 10/18/2012 0418   CO2 20 10/18/2012 0418   GLUCOSE 97 10/18/2012 0418   BUN 9 10/18/2012 0418   CREATININE 0.73 10/18/2012 0418   CALCIUM 9.0 10/18/2012 0418   PROT 6.8 10/18/2012 0418   ALBUMIN 3.5 10/18/2012 0418   AST 19 10/18/2012 0418   ALT 15 10/18/2012 0418   ALKPHOS 54 10/18/2012 0418   BILITOT 0.7 10/18/2012 0418   GFRNONAA 79* 10/18/2012 0418   GFRAA >90 10/18/2012 0418    Intake/Output Summary (Last 24 hours) at 10/18/12 1116 Last data filed at 10/17/12 2030  Gross per 24 hour  Intake      0 ml  Output    250 ml  Net   -250 ml   Last BM - 10/27  Diet Order: General   IVF:    0.9 %  NaCl with KCl 20 mEq / L Last Rate: 75 mL/hr at 10/17/12 2152    Estimated Nutritional Needs:   Kcal:1650-1800 Protein:70-85g Fluid:1.6-1.9  NUTRITION DIAGNOSIS: -Inadequate oral intake (NI-2.1).  Status: Ongoing  RELATED TO: tiredness, weakness, nausea  AS EVIDENCE BY: family statement, reported weight loss of unspecified amount  MONITORING/EVALUATION(Goals): 1. Resolution of nausea 2. Pt to consume >75% of meals  EDUCATION NEEDS: -No education needs identified at this time   Dietitian #: [phone]  DOCUMENTATION CODES Per approved criteria  -Not applicable     Marshall Cork 10/18/2012, 11:12 AM

## 2012-10-18 NOTE — Progress Notes (Addendum)
Bilateral:  No evidence of hemodynamically significant internal carotid artery stenosis.  Left ICA shows 40-59% stenosis by velocities, probably due to vessel tortuosity.  Vertebral artery flow is antegrade.

## 2012-10-18 NOTE — Progress Notes (Signed)
Dr. Kevan Ny made aware re; daughter's request for their mother to be seen by an endocrinologist,MD said he will call and consult to an endocrinologist.

## 2012-10-18 NOTE — Progress Notes (Signed)
Utilization review completed.  

## 2012-10-18 NOTE — Progress Notes (Signed)
PT Cancellation Note  Patient Details Name: Cindy [name] MRN: [id number] DOB: 02-11-1930   Cancelled Treatment:    Reason Eval/Treat Not Completed: Fatigue/lethargy limiting ability to participate Daughter reports pt has just returned from MRI and hasn't slept much last night.  Will return for PT eval in AM   Donnetta Hail 10/18/2012, 1:37 PM

## 2012-10-18 NOTE — Progress Notes (Signed)
Subjective: Cindy [name] was admitted yesterday Sunday after I saw her in the office 2 days prior where she was extremely distressed complaining of nausea and that "she just doesn't feel well " and was insisting that she see an endocrinologist because she was concerned about being hyperthyroid and we had reduced her Synthroid but she had decided to stop taking it altogether. Laboratories in the emergency room yesterday revealed alkalosis, respiratory, likely secondary to hyperventilation.  Otherwise workup was rather unrevealing.  Potassium was slightly low at 3.0.  She has complained of headache intermittently but not this morning.  She has a known small cerebral aneurysm on the right.  She also has been having some posterior right paracervical neck pain.  This morning she also complains of some chest pain but it is a palpable pain at the bottom of the sternum.  She usually is quite calm and I have never seen her so upset and anxious.  I worried about a small stroke but nothing was seen on CT scanning yesterday.  With her current headache that is intermittent and mental status change, i.e. severe anxiety, and neck pain, we'll go ahead and order an MRI/MRA of the head and neck to assess the aneurysm again and check for any other abnormality.  For anxiety and hyperventilation will plan to give Xanax  Objective: Weight change:   Intake/Output Summary (Last 24 hours) at 10/18/12 0724 Last data filed at 10/17/12 2030  Gross per 24 hour  Intake      0 ml  Output    250 ml  Net   -250 ml   Filed Vitals:   10/17/12 1345 10/17/12 2030 10/18/12 0538  BP: 142/66 154/62 141/70  Pulse: 74 75 72  Temp: 98.2 F (36.8 C) 98.4 F (36.9 C) 98.1 F (36.7 C)  TempSrc: Oral Oral Oral  Resp: 18 16 18   Height:  5\' 5"  (1.651 m)   Weight:  69.5 kg (153 lb 3.5 oz)   SpO2: 100% 100% 99%   General Appearance: Appears distressed with eyes closed and is mildly hyperventilating.  When asked to open eyes she does  not.  States that her chest hurts over the lower sternal area and it is a palpable pain Head: Normocephalic, without obvious abnormality, atraumatic Neck: Supple, symmetrical, not complaining of neck pain currently Lungs: Clear to auscultation bilaterally, respirations unlabored Chest: Mildly tender to palpation over the lower sternum Heart: Regular rate and rhythm, S1 and S2 normal, no murmur, rub or gallop Abdomen: Soft, non-tender, bowel sounds active all four quadrants, no masses, no organomegaly Extremities: Extremities normal, atraumatic, no cyanosis or edema Pulses: 2+ and symmetric all extremities Skin: Skin color, texture, turgor normal, no rashes or lesions Neuro: CNII-XII intact. Normal strength, sensation and reflexes throughout   Lab Results:  Basename 10/18/12 0418 10/17/12 1710 10/17/12 1425  NA 132* -- 129*  K 3.5 3.0* --  CL 99 -- 95*  CO2 20 -- 19  GLUCOSE 97 -- 97  BUN 9 -- 13  CREATININE 0.73 -- 0.69  CALCIUM 9.0 -- 9.1  MG -- -- --  PHOS -- -- --    Basename 10/18/12 0418 10/17/12 1425  AST 19 17  ALT 15 15  ALKPHOS 54 52  BILITOT 0.7 0.3  PROT 6.8 6.8  ALBUMIN 3.5 3.5    Basename 10/17/12 1425  LIPASE 27  AMYLASE --    Basename 10/18/12 0418 10/17/12 1425  WBC 8.2 7.7  NEUTROABS -- 3.9  HGB 12.9 12.9  HCT 37.0 36.3  MCV 87.3 87.9  PLT 306 291    Basename 10/17/12 1425  CKTOTAL --  CKMB --  CKMBINDEX --  TROPONINI <0.30   No components found with this basename: POCBNP:3 No results found for this basename: DDIMER:2 in the last 72 hours No results found for this basename: HGBA1C:2 in the last 72 hours No results found for this basename: CHOL:2,HDL:2,LDLCALC:2,TRIG:2,CHOLHDL:2,LDLDIRECT:2 in the last 72 hours  Basename 10/17/12 1710 10/17/12 1705  TSH 0.362 --  T4TOTAL -- --  T3FREE -- 2.3  THYROIDAB -- --    Basename 10/17/12 1705  VITAMINB12 708  FOLATE --  FERRITIN --  TIBC --  IRON --  RETICCTPCT --     Studies/Results: Dg Chest 2 View  10/17/2012  *RADIOLOGY REPORT*  Clinical Data: 78 year old female with chest pain.  CHEST - 2 VIEW  Comparison: None  Findings: Upper limits normal heart size noted. There is no evidence of focal airspace disease, pulmonary edema, suspicious pulmonary nodule/mass, pleural effusion, or pneumothorax. No acute bony abnormalities are identified.  IMPRESSION: No evidence of acute cardiopulmonary disease.   Original Report Authenticated By: Rosendo Gros, M.D.    Ct Head Wo Contrast  10/17/2012  *RADIOLOGY REPORT*  Clinical Data: Weakness for several months.  Nausea.  History of hypertension and depression.  CT HEAD WITHOUT CONTRAST  Technique:  Contiguous axial images were obtained from the base of the skull through the vertex without contrast.  Comparison: Head CT 01/05/2012.  Findings: There is no evidence of acute intracranial hemorrhage, mass lesion, brain edema or extra-axial fluid collection.  The ventricles and subarachnoid spaces are appropriately sized for age. There is no CT evidence of acute cortical infarction.  Chronic small vessel ischemic changes in the periventricular white matter and basal ganglia are stable.  The visualized paranasal sinuses are clear. The calvarium is intact.  IMPRESSION: Stable chronic small vessel ischemic changes.  No acute intracranial findings demonstrated.   Original Report Authenticated By: Gerrianne Scale, M.D.    Medications: Scheduled Meds:   . amLODipine  5 mg Oral Daily  . cholecalciferol  1,000 Units Oral Daily  . diphenhydrAMINE  25 mg Oral Once  . enoxaparin (LOVENOX) injection  40 mg Subcutaneous Q24H  . estrogens (conjugated)  0.625 mg Oral Daily  . FLUoxetine  40 mg Oral Daily  . ibuprofen  400 mg Oral Once  . irbesartan  75 mg Oral Daily  . levothyroxine  100 mcg Oral Daily  . LORazepam  0.5 mg Intravenous Once  . LORazepam  0.5 mg Intravenous Once  . ondansetron  8 mg Oral Once  . pneumococcal 23  valent vaccine  0.5 mL Intramuscular Tomorrow-1000  . potassium chloride  40 mEq Oral Once  . sodium chloride  1,000 mL Intravenous Once  . DISCONTD: potassium chloride  10 mEq Intravenous Q1 Hr x 3   Continuous Infusions:   . 0.9 % NaCl with KCl 20 mEq / L 75 mL/hr at 10/17/12 2152   PRN Meds:.acetaminophen, acetaminophen, ondansetron (ZOFRAN) IV, ondansetron, senna-docusate, zolpidem, DISCONTD: zolpidem  Assessment/Plan:  Principal Problem:  Weakness  Active Problems:  Hyponatremia  Hypokalemia  Hypothyroidism  Fatigue   Generalized Weakness/Fatigue  -Etiology unclear.  The thyroid function at this time is normal.  Will continue to follow off of thyroid replacement therapy  B12 and RPR are normal and negative respectively.  Mild hyponatremia has corrected with IV fluids.  Doubt this was contributory. -She also has a h/o depression, and  with her recent mood changes, I wonder if this could be the cause. We need to r/o organic causes first.  -Doubt CVA as she has a completely non-focal neuro exam. CT Head was negative and this has been going on for a month.  Has history of middle cerebral aneurysm and also is complaining of neck pain.  Will check MRI MRA of the head and neck. Will get PT/OT to assess her for further recommendations.  Hyponatremia - resolved with IV fluids overnight Hypokalemia - resolved with by mouth replacement.  Continue in IV fluids Hypothyroidism -  Thyroid function currently is normal.  Synthroid has been resumed at 100 micrograms daily and will follow thyroid function carefully DVT Prophylaxis  -Lovenox. Anxiety/agitation - will try Xanax 0.25 milligrams every 6 hours Respiratory alkalosis - will check ABG today Insomnia - try Restoril at bedtime, 15 milligrams History of depression - continue Prozac 40 milligrams daily Hypertension - continue ACE receptor blockade Estrogen replacement therapy - continue for now   LOS: 1 day   Peola Joynt  NEVILL 10/18/2012, 7:24 AM

## 2012-10-18 NOTE — Progress Notes (Signed)
[facility] hospitalist progress note. Chief complaint. Headache. History of present illness. This 78 year old female was admitted with complaints of weakness, fatigue, nausea. Etiology on admission remained unclear but hyponatremia and possibly hypothyroidism could be contributory. Depression also consider a possible etiology. Patient had a nonfocal neural exam on admission. A CT scan was negative. Patient is complaining of a headache and has requested to be seen. I came to the bedside to further assess the patient. She indicates that she has a known aneurysm in the brain. I note that a MRI/MRA was done in in June of 2012 that showed a saccular right MCA aneurysm arising from the M1 segment directly superiorly measuring 6 x 5 x 4 mm. Small saccular aneurysm versus atherosclerotic pseudo-lesion projects laterally from the right ICA cavernous segment measuring 2-3 mm. The patient states she currently has a headache extending from the frontal region across the top of the head. She states she does have a history of sinus headaches. She is unsure if this current headache is a sinus headache but the impression I get is that it is more extensive. She denies any visual changes. He denies any focal weakness. She states that the aneurysm is followed with a every six-month appointment with neurosurgery in [place]. Vital signs. Temperature 98.1, pulse 72, respiration 18, blood pressure 141/70. O2 sats 99%.  General appearance. So well-developed elderly female who is alert and cooperative with exam. Cardiac. Regular rate and rhythm. Lungs. Clear. Abdomen. Soft with bowel sounds. Neurologic. Cranial nerves II through XII grossly intact. William lateral or focal defects. Impression/plan. Problem #1. Headache. I think the patient's underlying concern here is that there may be changes in her existing aneurysm. I see no concerning neurologic changes per bedside exam. The patient had a CT scan this admission that found stable  chronic small vessel ischemic changes with no acute intracranial findings demonstrated. It may be reasonable to consult neurosurgery or repeat the MRI but will defer this decision to the rounding physician. I will treat the current headache with ibuprofen. I'm reluctant to use any narcotics as they may mask any symptom development.

## 2012-10-18 NOTE — Progress Notes (Signed)
OT Cancellation Note  Patient Details Name: Cindy [name] MRN: [id number] DOB: 1930-07-27   Cancelled Treatment:    Reason Eval/Treat Not Completed: Other (comment) (family requesting to let pt sleep) Pt's daughters state pt hasnt slept in several days and request that OT check back later today. Daughters asked pt if she wanted to get up but pt shook head "no." Will try back later today as schedule permits or tomorrow. Note pt for MRI today also.  Lennox Laity [phone] 10/18/2012, 10:58 AM

## 2012-10-19 ENCOUNTER — Observation Stay (HOSPITAL_COMMUNITY): Payer: Medicare Other

## 2012-10-19 DIAGNOSIS — D131 Benign neoplasm of stomach: Secondary | ICD-10-CM | POA: Diagnosis not present

## 2012-10-19 DIAGNOSIS — R112 Nausea with vomiting, unspecified: Secondary | ICD-10-CM | POA: Diagnosis not present

## 2012-10-19 DIAGNOSIS — E079 Disorder of thyroid, unspecified: Secondary | ICD-10-CM | POA: Diagnosis not present

## 2012-10-19 LAB — BASIC METABOLIC PANEL
BUN: 9 mg/dL (ref 6–23)
CO2: 21 mEq/L (ref 19–32)
Chloride: 98 mEq/L (ref 96–112)
Creatinine, Ser: 0.78 mg/dL (ref 0.50–1.10)
Glucose, Bld: 126 mg/dL — ABNORMAL HIGH (ref 70–99)
Potassium: 3.5 mEq/L (ref 3.5–5.1)

## 2012-10-19 MED ORDER — PANTOPRAZOLE SODIUM 40 MG PO TBEC
40.0000 mg | DELAYED_RELEASE_TABLET | Freq: Every day | ORAL | Status: DC
  Administered 2012-10-19 – 2012-10-22 (×4): 40 mg via ORAL
  Filled 2012-10-19 (×4): qty 1

## 2012-10-19 MED ORDER — ONDANSETRON HCL 4 MG/2ML IJ SOLN
4.0000 mg | Freq: Three times a day (TID) | INTRAMUSCULAR | Status: AC
  Administered 2012-10-19 – 2012-10-21 (×6): 4 mg via INTRAVENOUS
  Filled 2012-10-19 (×6): qty 2

## 2012-10-19 MED ORDER — KETOROLAC TROMETHAMINE 30 MG/ML IJ SOLN
30.0000 mg | Freq: Once | INTRAMUSCULAR | Status: AC
  Administered 2012-10-19: 30 mg via INTRAVENOUS
  Filled 2012-10-19: qty 1

## 2012-10-19 NOTE — Progress Notes (Signed)
Clinical Social Worker received referral in [facility] stating see other comments and other comment was admission history. Clinical Social Worker spoke with RN and reviewed chart. No social work needs identified by Charity fundraiser and PT recommending Home with HH services. Inappropriate CSW referral. Please reconsult if social work needs arise. Clinical Social Worker signing off.   Jacklynn Lewis, MSW, LCSWA  Clinical Social Work [phone]

## 2012-10-19 NOTE — Consult Note (Signed)
[facility] Consultation Note  Referring Provider:  Dr. Johnnette Barrios Primary Care Physician:  Pearla Dubonnet, MD  Reason for Consultation:  nausea  HPI: Cindy [name] is a 78 y.o. female we've been asked to see for nausea.  Has had trouble with near-constant nausea for the past 6 months, progressively worsening.  No associated vomiting.  Nausea does worsen after eating.  No abdominal pain.  Has globus sensation, but no dysphagia.  No change in bowel habits, blood in stool.  No early satiety.  Takes NSAIDs (Excedrin) regularly for headaches.  Has lost about 10 lbs in the past couple months.  Is post cholecystectomy several years ago.  Endoscopy in 2004 for epigastric pain, unrevealing.  Colonoscopy 2011 by Dr. Danise Edge showed some left-sided diverticulosis and internal hemorrhoids.   Past Medical History  Diagnosis Date  . PONV (postoperative nausea and vomiting)   . Hypertension   . Hyperlipemia   . Hypothyroidism   . GERD (gastroesophageal reflux disease)   . Arthritis   . Depression   . Deaf, left     Past Surgical History  Procedure Date  . Abdominal hysterectomy   . Cholecystectomy   . Tonsillectomy   . Appendectomy   . Thyroidectomy, partial   . Ercp   . Colonoscopy   . Eye surgery     cataracts  . Knee arthroscopy 05/11/2012    Procedure: ARTHROSCOPY KNEE;  Surgeon: Velna Ochs, MD;  Location: [facility];  Service: Orthopedics;  Laterality: Right;  left knee medial menisectomy and chondroplasty    Prior to Admission medications   Medication Sig Start Date End Date Taking? Authorizing Provider  amLODipine (NORVASC) 5 MG tablet Take 5 mg by mouth daily.   Yes Historical Provider, MD  cholecalciferol (VITAMIN D) 1000 UNITS tablet Take 1,000 Units by mouth daily.   Yes Historical Provider, MD  estrogens, conjugated, (PREMARIN) 0.625 MG tablet Take 0.625 mg by mouth daily.    Yes Historical Provider, MD  FLUoxetine (PROZAC) 40  MG capsule Take 40 mg by mouth daily.   Yes Historical Provider, MD  levothyroxine (SYNTHROID, LEVOTHROID) 100 MCG tablet Take 100 mcg by mouth daily.   Yes Historical Provider, MD  Probiotic Product (ALIGN) 4 MG CAPS Take 1 capsule by mouth daily.   Yes Historical Provider, MD  telmisartan (MICARDIS) 80 MG tablet Take 80 mg by mouth daily.   Yes Historical Provider, MD  zolpidem (AMBIEN) 10 MG tablet Take 10 mg by mouth at bedtime as needed. For sleep.   Yes Historical Provider, MD    Current Facility-Administered Medications  Medication Dose Route Frequency Provider Last Rate Last Dose  . 0.9 % NaCl with KCl 20 mEq/ L  infusion   Intravenous Continuous Henderson Cloud, MD 75 mL/hr at 10/19/12 [phone]    . acetaminophen (TYLENOL) tablet 650 mg  650 mg Oral Q6H PRN Henderson Cloud, MD   650 mg at 10/17/12 2309   Or  . acetaminophen (TYLENOL) suppository 650 mg  650 mg Rectal Q6H PRN Henderson Cloud, MD      . ALPRAZolam Prudy Feeler) tablet 0.25 mg  0.25 mg Oral TID PRN Marden Noble, MD   0.25 mg at 10/19/12 0406  . amLODipine (NORVASC) tablet 5 mg  5 mg Oral Daily Estela Isaiah Blakes, MD   5 mg at 10/18/12 1021  . cholecalciferol (VITAMIN D) tablet 1,000 Units  1,000 Units Oral Daily Estela Isaiah Blakes,  MD   1,000 Units at 10/18/12 1021  . enoxaparin (LOVENOX) injection 40 mg  40 mg Subcutaneous Q24H Henderson Cloud, MD   40 mg at 10/18/12 2213  . estrogens (conjugated) (PREMARIN) tablet 0.625 mg  0.625 mg Oral Daily Henderson Cloud, MD   0.625 mg at 10/18/12 1021  . FLUoxetine (PROZAC) capsule 40 mg  40 mg Oral Daily Henderson Cloud, MD   40 mg at 10/18/12 1021  . gadobenate dimeglumine (MULTIHANCE) injection 15 mL  15 mL Intravenous Once PRN Medication Radiologist, MD   15 mL at 10/18/12 1256  . irbesartan (AVAPRO) tablet 75 mg  75 mg Oral Daily Henderson Cloud, MD   75 mg at 10/18/12 1051  . levothyroxine (SYNTHROID,  LEVOTHROID) tablet 100 mcg  100 mcg Oral Daily Henderson Cloud, MD   100 mcg at 10/17/12 2309  . ondansetron (ZOFRAN) tablet 4 mg  4 mg Oral Q6H PRN Henderson Cloud, MD       Or  . ondansetron [facility]) injection 4 mg  4 mg Intravenous Q6H PRN Henderson Cloud, MD      . pantoprazole (PROTONIX) EC tablet 40 mg  40 mg Oral Daily Marden Noble, MD      . pneumococcal 23 valent vaccine (PNU-IMMUNE) injection 0.5 mL  0.5 mL Intramuscular Tomorrow-1000 Estela Isaiah Blakes, MD      . senna-docusate (Senokot-S) tablet 1 tablet  1 tablet Oral QHS PRN Henderson Cloud, MD      . temazepam (RESTORIL) capsule 15 mg  15 mg Oral QHS PRN Marden Noble, MD   15 mg at 10/18/12 2336  . zolpidem (AMBIEN) tablet 5 mg  5 mg Oral QHS PRN Marden Noble, MD   5 mg at 10/18/12 2212  . DISCONTD: potassium chloride SA (K-DUR,KLOR-CON) CR tablet 40 mEq  40 mEq Oral Once Henderson Cloud, MD      . DISCONTD: zolpidem (AMBIEN) tablet 10 mg  10 mg Oral QHS PRN Katy Apo, MD      . DISCONTD: zolpidem (AMBIEN) tablet 5 mg  5 mg Oral QHS PRN Gwen Her, PHARMD   5 mg at 10/17/12 2309    Allergies as of 10/17/2012 - Review Complete 10/17/2012  Allergen Reaction Noted  . Morphine and related Hives 05/10/2012    History reviewed. No pertinent family history.  History   Social History  . Marital Status: Widowed    Spouse Name: N/A    Number of Children: N/A  . Years of Education: N/A   Occupational History  . Not on file.   Social History Main Topics  . Smoking status: Never Smoker   . Smokeless tobacco: Not on file  . Alcohol Use: No  . Drug Use: No  . Sexually Active:    Other Topics Concern  . Not on file   Social History Narrative  . No narrative on file    Review of Systems: ROS Dr. Ardyth Harps 10/17/12 reviewed and I agree  Physical Exam: Vital signs in last 24 hours: Temp:  [97.9 F (36.6 C)-98.2 F (36.8 C)] 98.2 F (36.8 C) (10/29  0451) Pulse Rate:  [62-63] 62  (10/29 0451) Resp:  [18] 18  (10/29 0451) BP: (124-140)/(50-63) 124/50 mmHg (10/29 0451) SpO2:  [92 %-94 %] 94 % (10/29 0451) Last BM Date: 10/17/12 General:   Alert,  NAD, somewhat depressed-appearing Head:  Normocephalic and atraumatic. Eyes:  Sclera  clear, no icterus.   Conjunctiva pink. Ears:  Normal auditory acuity. Nose:  No deformity, discharge,  or lesions. Mouth:  No deformity or lesions.  Oropharynx pink & somewhat dry. Neck:  Supple Lungs:  Clear throughout to auscultation.   No wheezes, crackles, or rhonchi. No acute distress. Heart:  Regular rate and rhythm; no murmurs, clicks, rubs,  or gallops. Abdomen:  Soft, nontender and nondistended. No masses, hepatosplenomegaly or hernias noted. Normal bowel sounds, without guarding, and without rebound.     Msk:  Symmetrical without gross deformities. Normal posture. Pulses:  Normal pulses noted. Extremities:  Without clubbing or edema. Neurologic:  Alert and  oriented x4;  Diffusely weak, otherwise grossly normal neurologically. Skin:  Intact without significant lesions or rashes. Psych:  Alert and cooperative. Depressed mood, flat affect   Lab Results:  Basename 10/18/12 0418 10/17/12 1425  WBC 8.2 7.7  HGB 12.9 12.9  HCT 37.0 36.3  PLT 306 291   BMET  Basename 10/19/12 0406 10/18/12 0418 10/17/12 1710 10/17/12 1425  NA 131* 132* -- 129*  K 3.5 3.5 3.0* --  CL 98 99 -- 95*  CO2 21 20 -- 19  GLUCOSE 126* 97 -- 97  BUN 9 9 -- 13  CREATININE 0.78 0.73 -- 0.69  CALCIUM 8.6 9.0 -- 9.1   LFT  Basename 10/18/12 0418  PROT 6.8  ALBUMIN 3.5  AST 19  ALT 15  ALKPHOS 54  BILITOT 0.7  BILIDIR --  IBILI --   PT/INR No results found for this basename: LABPROT:2,INR:2 in the last 72 hours  Studies/Results: Dg Chest 2 View  10/17/2012  *RADIOLOGY REPORT*  Clinical Data: 78 year old female with chest pain.  CHEST - 2 VIEW  Comparison: None  Findings: Upper limits normal heart size  noted. There is no evidence of focal airspace disease, pulmonary edema, suspicious pulmonary nodule/mass, pleural effusion, or pneumothorax. No acute bony abnormalities are identified.  IMPRESSION: No evidence of acute cardiopulmonary disease.   Original Report Authenticated By: Rosendo Gros, M.D.    Ct Head Wo Contrast  10/17/2012  *RADIOLOGY REPORT*  Clinical Data: Weakness for several months.  Nausea.  History of hypertension and depression.  CT HEAD WITHOUT CONTRAST  Technique:  Contiguous axial images were obtained from the base of the skull through the vertex without contrast.  Comparison: Head CT 01/05/2012.  Findings: There is no evidence of acute intracranial hemorrhage, mass lesion, brain edema or extra-axial fluid collection.  The ventricles and subarachnoid spaces are appropriately sized for age. There is no CT evidence of acute cortical infarction.  Chronic small vessel ischemic changes in the periventricular white matter and basal ganglia are stable.  The visualized paranasal sinuses are clear. The calvarium is intact.  IMPRESSION: Stable chronic small vessel ischemic changes.  No acute intracranial findings demonstrated.   Original Report Authenticated By: Gerrianne Scale, M.D.    Mr [facility] Wo Contrast  10/18/2012  *RADIOLOGY REPORT*  Clinical Data: 78 year old female with mental status change and headache.  History of right MCA aneurysm.  MRI HEAD WITHOUT AND WITH CONTRAST  Technique: Multiplanar, multiecho pulse sequences of the brain and surrounding structures were obtained according to standard protocol without and with intravenous contrast.  Contrast: 15mL MULTIHANCE GADOBENATE DIMEGLUMINE 529 MG/ML IV SOLN  Comparison: Brain MRI and MRA 06/14/2011.  Cerebral angiogram 06/20/2011.  Findings:  No restricted diffusion to suggest acute infarction.  No midline shift, mass effect, evidence of mass lesion, ventriculomegaly, extra-axial collection or acute intracranial  hemorrhage.   Cervicomedullary junction and pituitary are within normal limits.  Major intracranial vascular flow voids are preserved.  Confluent cerebral white matter T2 and FLAIR hyperintensity extending from the corona radiata to the deep white matter capsules is not significantly changed.  Patchy pontine T2 and FLAIR hyperintensity also is stable.  T2 heterogeneity in the deep gray matter nuclei is stable. No abnormal enhancement identified.  No new signal abnormality.  Negative visualized cervical spine. Visualized bone marrow signal is within normal limits.  Stable orbits.  Stable paranasal sinuses and mastoids.  Negative scalp soft tissues.  IMPRESSION: 1. No acute intracranial abnormality.  Stable MRI appearance of the brain. 2.  See MRA findings below.  MRA HEAD WITHOUT CONTRAST  Technique: Angiographic images of the Circle of Willis were obtained using MRA technique without  intravenous contrast.  Findings:  Antegrade flow in the posterior circulation.  Stable dominant distal left vertebral artery.  Normal right PICA.  Stable and normal vertebrobasilar junction.  Stable mild mid basilar artery irregularity without significant stenosis.  SCA and PCA origins are stable within normal limits.  Stable bilateral PCA branches, normal except for mild irregularity at the right bifurcation.  Antegrade flow in both ICA siphons.  Small 2-3 mm laterally directed saccular lesion from the right cavernous ICA is stable. Ophthalmic artery origins remain normal.  No significant ICA stenosis.  Carotid termini, MCA and ACA origins remain normal. Posterior communicating arteries are diminutive or absent. Anterior communicating artery and visualized ACA branches remain within normal limits.  Visualized left MCA branches remain within normal limits.  Right MCA M1 segment is patent with mild to moderate irregularity as before.  Superiorly directed right MCA M1 segment aneurysm re- identified arising just beyond the right lenticulostriate and  right anterior temporal artery origins.  This has not significantly changed in size measuring 4 x 5 x 6 mm as before.  The more distal right M1 segment remains patent.  Right MCA bifurcation is stable with mild irregularity.  There is a second smaller 1-2 mm aneurysm at the right MCA bifurcation which was less evident on the prior. Visualized right MCA branches are stable.  IMPRESSION: 1.  Stable right MCA M1 segment aneurysm directed superiorly measuring 4 x 5 x 6 mm. 2.  A second smaller right MCA bifurcation aneurysm measuring 2 x 3 mm is better demonstrated on the study, but probably not significantly changed since 2012. 3.  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 4.  No new intracranial MRA abnormality.   Original Report Authenticated By: Harley Hallmark, M.D.    Mr Laqueta Jean Wo Contrast  10/18/2012  *RADIOLOGY REPORT*  Clinical Data: 78 year old female with mental status change and headache.  History of right MCA aneurysm.  MRI HEAD WITHOUT AND WITH CONTRAST  Technique: Multiplanar, multiecho pulse sequences of the brain and surrounding structures were obtained according to standard protocol without and with intravenous contrast.  Contrast: 15mL MULTIHANCE GADOBENATE DIMEGLUMINE 529 MG/ML IV SOLN  Comparison: Brain MRI and MRA 06/14/2011.  Cerebral angiogram 06/20/2011.  Findings:  No restricted diffusion to suggest acute infarction.  No midline shift, mass effect, evidence of mass lesion, ventriculomegaly, extra-axial collection or acute intracranial hemorrhage.  Cervicomedullary junction and pituitary are within normal limits.  Major intracranial vascular flow voids are preserved.  Confluent cerebral white matter T2 and FLAIR hyperintensity extending from the corona radiata to the deep white matter capsules is not significantly changed.  Patchy  pontine T2 and FLAIR hyperintensity also is stable.  T2 heterogeneity in the deep gray matter nuclei  is stable. No abnormal enhancement identified.  No new signal abnormality.  Negative visualized cervical spine. Visualized bone marrow signal is within normal limits.  Stable orbits.  Stable paranasal sinuses and mastoids.  Negative scalp soft tissues.  IMPRESSION: 1. No acute intracranial abnormality.  Stable MRI appearance of the brain. 2.  See MRA findings below.  MRA HEAD WITHOUT CONTRAST  Technique: Angiographic images of the Circle of Willis were obtained using MRA technique without  intravenous contrast.  Findings:  Antegrade flow in the posterior circulation.  Stable dominant distal left vertebral artery.  Normal right PICA.  Stable and normal vertebrobasilar junction.  Stable mild mid basilar artery irregularity without significant stenosis.  SCA and PCA origins are stable within normal limits.  Stable bilateral PCA branches, normal except for mild irregularity at the right bifurcation.  Antegrade flow in both ICA siphons.  Small 2-3 mm laterally directed saccular lesion from the right cavernous ICA is stable. Ophthalmic artery origins remain normal.  No significant ICA stenosis.  Carotid termini, MCA and ACA origins remain normal. Posterior communicating arteries are diminutive or absent. Anterior communicating artery and visualized ACA branches remain within normal limits.  Visualized left MCA branches remain within normal limits.  Right MCA M1 segment is patent with mild to moderate irregularity as before.  Superiorly directed right MCA M1 segment aneurysm re- identified arising just beyond the right lenticulostriate and right anterior temporal artery origins.  This has not significantly changed in size measuring 4 x 5 x 6 mm as before.  The more distal right M1 segment remains patent.  Right MCA bifurcation is stable with mild irregularity.  There is a second smaller 1-2 mm aneurysm at the right MCA bifurcation which was less evident on the prior. Visualized right MCA branches are stable.  IMPRESSION:  1.  Stable right MCA M1 segment aneurysm directed superiorly measuring 4 x 5 x 6 mm. 2.  A second smaller right MCA bifurcation aneurysm measuring 2 x 3 mm is better demonstrated on the study, but probably not significantly changed since 2012. 3.  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 4.  No new intracranial MRA abnormality.   Original Report Authenticated By: Harley Hallmark, M.D.    US Soft Tissue Head/neck  10/19/2012  *RADIOLOGY REPORT*  Clinical Data: Fullness in lower throat.  History of "thyroid disease." Partial thyroidectomy per patient report.  THYROID ULTRASOUND  Technique: Ultrasound examination of the thyroid gland and adjacent soft tissues was performed.  Comparison:  None.  Findings:  Right thyroid lobe:  2.7 x 1.0 x 1.0 cm Left thyroid lobe:  Not visualized, likely surgically absent. Isthmus:  3 mm  Focal nodules:  No dominant solid nodule or mass identified within the right lobe or isthmus of the thyroid.  Lymphadenopathy:  Small nodes identified bilaterally.  No adenopathy.  IMPRESSION:  1.  Lack of visualization of the left lobe of the thyroid, presumably surgically absent, given the patient history. 2. Diminutive right lobe of the thyroid, without evidence of dominant nodule or mass.   Original Report Authenticated By: Consuello Bossier, M.D.     Impression:  1.  Nausea.  Uses NSAIDs for headaches regularly.  Symptoms longstanding over the past six months.  Suspect anxiety contribution to symptoms, but post-prandial exacerbation of nausea suggests possible GI source.  Considerations include GERD, peptic  ulcer, gastritis.  Patient is post cholecystectomy.  She had recent cranial MRI, with chronic small intracranial vascular aneurysms.  Plan:  1.  Increase Protonix to 40 mg bid.  Scheduled antiemetics x 48 hours, to help try to break cycle of nausea. 2.  Upper endoscopy (EGD) tomorrow. 3.  If upper endoscopy is negative,  could consider gastric emptying study. 4.  Risks (bleeding, infection, bowel perforation that could require surgery, sedation-related changes in cardiopulmonary systems), benefits (identification and possible treatment of source of symptoms, exclusion of certain causes of symptoms), and alternatives (watchful waiting, radiographic imaging studies, empiric medical treatment) of upper endoscopy (EGD) were explained to patient/family in detail and patient wishes to proceed.   LOS: 2 days   Clemie General M  10/19/2012, 11:12 AM

## 2012-10-19 NOTE — Progress Notes (Signed)
Subjective: 1 Cindy [name] is feeling much better today.  She did have a low PCO2 on ABG testing yesterday.  She has been given some Xanax and slept for 4-5 hours this morning and feels much better.  She she mentions that she has some dysphagia and feels like there's fullness in her lower throat anteriorly, especially when she swallows.  She does have a history of hypothyroidism.  She also has had chronic nausea.  Today she is alert, eyes are open and she is communicating well.  This is the first time she has done that and 4-5 days.  The only change clinically is that she was given Xanax and she had a slightly low potassium corrected  Objective: Weight change:   Intake/Output Summary (Last 24 hours) at 10/19/12 0809 Last data filed at 10/18/12 2200  Gross per 24 hour  Intake   2050 ml  Output    300 ml  Net   1750 ml   Filed Vitals:   10/18/12 0538 10/18/12 1021 10/18/12 2125 10/19/12 0451  BP: 141/70 150/69 140/63 124/50  Pulse: 72  63 62  Temp: 98.1 F (36.7 C)  97.9 F (36.6 C) 98.2 F (36.8 C)  TempSrc: Oral  Oral Oral  Resp: 18  18 18   Height:      Weight:      SpO2: 99%  92% 94%   General Appearance: Comfortable this morning and not hyperventilating.  Conversive and calm. Does report fullness and lower anterior throat with swallowing and is concerned about chronic nausea Head: Normocephalic, without obvious abnormality, atraumatic  Neck: Supple, symmetrical, not complaining of neck pain currently  Lungs: Clear to auscultation bilaterally, respirations unlabored  Chest: Mildly tender to palpation over the lower sternum  Heart: Regular rate and rhythm, S1 and S2 normal, no murmur, rub or gallop  Abdomen: Soft, non-tender, bowel sounds active all four quadrants, no masses, no organomegaly  Extremities: Extremities normal, atraumatic, no cyanosis or edema  Pulses: 2+ and symmetric all extremities  Skin: Skin color, texture, turgor normal, no rashes or lesions  Neuro:  CNII-XII intact. Normal strength, sensation and reflexes throughout   Lab Results:  Basename 10/19/12 0406 10/18/12 0418  NA 131* 132*  K 3.5 3.5  CL 98 99  CO2 21 20  GLUCOSE 126* 97  BUN 9 9  CREATININE 0.78 0.73  CALCIUM 8.6 9.0  MG -- --  PHOS -- --    Basename 10/18/12 0418 10/17/12 1425  AST 19 17  ALT 15 15  ALKPHOS 54 52  BILITOT 0.7 0.3  PROT 6.8 6.8  ALBUMIN 3.5 3.5    Basename 10/17/12 1425  LIPASE 27  AMYLASE --    Basename 10/18/12 0418 10/17/12 1425  WBC 8.2 7.7  NEUTROABS -- 3.9  HGB 12.9 12.9  HCT 37.0 36.3  MCV 87.3 87.9  PLT 306 291    Basename 10/17/12 1425  CKTOTAL --  CKMB --  CKMBINDEX --  TROPONINI <0.30   No components found with this basename: POCBNP:3 No results found for this basename: DDIMER:2 in the last 72 hours No results found for this basename: HGBA1C:2 in the last 72 hours No results found for this basename: CHOL:2,HDL:2,LDLCALC:2,TRIG:2,CHOLHDL:2,LDLDIRECT:2 in the last 72 hours  Basename 10/17/12 1710 10/17/12 1705  TSH 0.362 --  T4TOTAL -- --  T3FREE -- 2.3  THYROIDAB -- --    Basename 10/17/12 1705  VITAMINB12 708  FOLATE --  FERRITIN --  TIBC --  IRON --  RETICCTPCT --  Studies/Results: Dg Chest 2 View  10/17/2012  *RADIOLOGY REPORT*  Clinical Data: 78 year old female with chest pain.  CHEST - 2 VIEW  Comparison: None  Findings: Upper limits normal heart size noted. There is no evidence of focal airspace disease, pulmonary edema, suspicious pulmonary nodule/mass, pleural effusion, or pneumothorax. No acute bony abnormalities are identified.  IMPRESSION: No evidence of acute cardiopulmonary disease.   Original Report Authenticated By: Rosendo Gros, M.D.    Ct Head Wo Contrast  10/17/2012  *RADIOLOGY REPORT*  Clinical Data: Weakness for several months.  Nausea.  History of hypertension and depression.  CT HEAD WITHOUT CONTRAST  Technique:  Contiguous axial images were obtained from the base of the skull  through the vertex without contrast.  Comparison: Head CT 01/05/2012.  Findings: There is no evidence of acute intracranial hemorrhage, mass lesion, brain edema or extra-axial fluid collection.  The ventricles and subarachnoid spaces are appropriately sized for age. There is no CT evidence of acute cortical infarction.  Chronic small vessel ischemic changes in the periventricular white matter and basal ganglia are stable.  The visualized paranasal sinuses are clear. The calvarium is intact.  IMPRESSION: Stable chronic small vessel ischemic changes.  No acute intracranial findings demonstrated.   Original Report Authenticated By: Gerrianne Scale, M.D.    Mr [facility] Wo Contrast  10/18/2012  *RADIOLOGY REPORT*  Clinical Data: 78 year old female with mental status change and headache.  History of right MCA aneurysm.  MRI HEAD WITHOUT AND WITH CONTRAST  Technique: Multiplanar, multiecho pulse sequences of the brain and surrounding structures were obtained according to standard protocol without and with intravenous contrast.  Contrast: 15mL MULTIHANCE GADOBENATE DIMEGLUMINE 529 MG/ML IV SOLN  Comparison: Brain MRI and MRA 06/14/2011.  Cerebral angiogram 06/20/2011.  Findings:  No restricted diffusion to suggest acute infarction.  No midline shift, mass effect, evidence of mass lesion, ventriculomegaly, extra-axial collection or acute intracranial hemorrhage.  Cervicomedullary junction and pituitary are within normal limits.  Major intracranial vascular flow voids are preserved.  Confluent cerebral white matter T2 and FLAIR hyperintensity extending from the corona radiata to the deep white matter capsules is not significantly changed.  Patchy pontine T2 and FLAIR hyperintensity also is stable.  T2 heterogeneity in the deep gray matter nuclei is stable. No abnormal enhancement identified.  No new signal abnormality.  Negative visualized cervical spine. Visualized bone marrow signal is within normal limits.  Stable  orbits.  Stable paranasal sinuses and mastoids.  Negative scalp soft tissues.  IMPRESSION: 1. No acute intracranial abnormality.  Stable MRI appearance of the brain. 2.  See MRA findings below.  MRA HEAD WITHOUT CONTRAST  Technique: Angiographic images of the Circle of Willis were obtained using MRA technique without  intravenous contrast.  Findings:  Antegrade flow in the posterior circulation.  Stable dominant distal left vertebral artery.  Normal right PICA.  Stable and normal vertebrobasilar junction.  Stable mild mid basilar artery irregularity without significant stenosis.  SCA and PCA origins are stable within normal limits.  Stable bilateral PCA branches, normal except for mild irregularity at the right bifurcation.  Antegrade flow in both ICA siphons.  Small 2-3 mm laterally directed saccular lesion from the right cavernous ICA is stable. Ophthalmic artery origins remain normal.  No significant ICA stenosis.  Carotid termini, MCA and ACA origins remain normal. Posterior communicating arteries are diminutive or absent. Anterior communicating artery and visualized ACA branches remain within normal limits.  Visualized left MCA branches remain within normal limits.  Right MCA M1 segment is patent with mild to moderate irregularity as before.  Superiorly directed right MCA M1 segment aneurysm re- identified arising just beyond the right lenticulostriate and right anterior temporal artery origins.  This has not significantly changed in size measuring 4 x 5 x 6 mm as before.  The more distal right M1 segment remains patent.  Right MCA bifurcation is stable with mild irregularity.  There is a second smaller 1-2 mm aneurysm at the right MCA bifurcation which was less evident on the prior. Visualized right MCA branches are stable.  IMPRESSION: 1.  Stable right MCA M1 segment aneurysm directed superiorly measuring 4 x 5 x 6 mm. 2.  A second smaller right MCA bifurcation aneurysm measuring 2 x 3 mm is better  demonstrated on the study, but probably not significantly changed since 2012. 3.  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 4.  No new intracranial MRA abnormality.   Original Report Authenticated By: Harley Hallmark, M.D.    Mr [facility] Wo Contrast  10/18/2012  *RADIOLOGY REPORT*  Clinical Data: 78 year old female with mental status change and headache.  History of right MCA aneurysm.  MRI HEAD WITHOUT AND WITH CONTRAST  Technique: Multiplanar, multiecho pulse sequences of the brain and surrounding structures were obtained according to standard protocol without and with intravenous contrast.  Contrast: 15mL MULTIHANCE GADOBENATE DIMEGLUMINE 529 MG/ML IV SOLN  Comparison: Brain MRI and MRA 06/14/2011.  Cerebral angiogram 06/20/2011.  Findings:  No restricted diffusion to suggest acute infarction.  No midline shift, mass effect, evidence of mass lesion, ventriculomegaly, extra-axial collection or acute intracranial hemorrhage.  Cervicomedullary junction and pituitary are within normal limits.  Major intracranial vascular flow voids are preserved.  Confluent cerebral white matter T2 and FLAIR hyperintensity extending from the corona radiata to the deep white matter capsules is not significantly changed.  Patchy pontine T2 and FLAIR hyperintensity also is stable.  T2 heterogeneity in the deep gray matter nuclei is stable. No abnormal enhancement identified.  No new signal abnormality.  Negative visualized cervical spine. Visualized bone marrow signal is within normal limits.  Stable orbits.  Stable paranasal sinuses and mastoids.  Negative scalp soft tissues.  IMPRESSION: 1. No acute intracranial abnormality.  Stable MRI appearance of the brain. 2.  See MRA findings below.  MRA HEAD WITHOUT CONTRAST  Technique: Angiographic images of the Circle of Willis were obtained using MRA technique without  intravenous contrast.  Findings:  Antegrade flow in the  posterior circulation.  Stable dominant distal left vertebral artery.  Normal right PICA.  Stable and normal vertebrobasilar junction.  Stable mild mid basilar artery irregularity without significant stenosis.  SCA and PCA origins are stable within normal limits.  Stable bilateral PCA branches, normal except for mild irregularity at the right bifurcation.  Antegrade flow in both ICA siphons.  Small 2-3 mm laterally directed saccular lesion from the right cavernous ICA is stable. Ophthalmic artery origins remain normal.  No significant ICA stenosis.  Carotid termini, MCA and ACA origins remain normal. Posterior communicating arteries are diminutive or absent. Anterior communicating artery and visualized ACA branches remain within normal limits.  Visualized left MCA branches remain within normal limits.  Right MCA M1 segment is patent with mild to moderate irregularity as before.  Superiorly directed right MCA M1 segment aneurysm re- identified arising just beyond the right lenticulostriate and right anterior temporal artery origins.  This has not significantly changed in size measuring  4 x 5 x 6 mm as before.  The more distal right M1 segment remains patent.  Right MCA bifurcation is stable with mild irregularity.  There is a second smaller 1-2 mm aneurysm at the right MCA bifurcation which was less evident on the prior. Visualized right MCA branches are stable.  IMPRESSION: 1.  Stable right MCA M1 segment aneurysm directed superiorly measuring 4 x 5 x 6 mm. 2.  A second smaller right MCA bifurcation aneurysm measuring 2 x 3 mm is better demonstrated on the study, but probably not significantly changed since 2012. 3.  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 4.  No new intracranial MRA abnormality.   Original Report Authenticated By: Harley Hallmark, M.D.    Medications: Scheduled Meds:   . amLODipine  5 mg Oral Daily  . cholecalciferol  1,000 Units  Oral Daily  . enoxaparin (LOVENOX) injection  40 mg Subcutaneous Q24H  . estrogens (conjugated)  0.625 mg Oral Daily  . FLUoxetine  40 mg Oral Daily  . irbesartan  75 mg Oral Daily  . levothyroxine  100 mcg Oral Daily  . pantoprazole  40 mg Oral Daily  . pneumococcal 23 valent vaccine  0.5 mL Intramuscular Tomorrow-1000  . potassium chloride  40 mEq Oral Once   Continuous Infusions:   . 0.9 % NaCl with KCl 20 mEq / L 75 mL/hr at 10/19/12 0651   PRN Meds:.acetaminophen, acetaminophen, ALPRAZolam, gadobenate dimeglumine, ondansetron (ZOFRAN) IV, ondansetron, senna-docusate, temazepam, zolpidem, DISCONTD: zolpidem, DISCONTD: zolpidem  Assessment/Plan:   Principal Problem:  Weakness  Active Problems:  Hyponatremia  Hypokalemia  Hypothyroidism  Fatigue  Generalized Weakness/Fatigue  -Etiology unclear. The thyroid function at this time is normal. Will continue on thyroid replacement therapy, 100 micrograms daily.  B12 and RPR are normal and negative respectively. Mild hyponatremia has corrected with IV fluids. Doubt this was contributory.  -She also has a h/o depression, and with her recent mood changes, I wonder if this could be the cause. We need to r/o organic causes first.  Feels better and seems to have occurred after taking Xanax.  She is not hyperventilating currently -  No evidence of stroke on MRI MRA of the brain.  Carotid Dopplers are nonocclusive.  Will cerebral artery is stable with small aneurysm Will get PT/OT to assess her for further recommendations.  Hyponatremia - resolved with IV fluids overnight  Hypokalemia - resolved with by mouth replacement. Continue in IV fluids  Hypothyroidism - Thyroid function currently is normal. Synthroid has been resumed at 100 micrograms daily and will follow thyroid function carefully  DVT Prophylaxis  -Lovenox.  Anxiety/agitation - will try Xanax 0.25 milligrams every 6 hours  Respiratory alkalosis - will check ABG today  Insomnia -  try Restoril at bedtime, 15 milligrams  History of depression - continue Prozac 40 milligrams daily  Hypertension - continue ACE receptor blockade  Estrogen replacement therapy - continue for now Nausea - chronic by history.  We'll consult GI for possible upper endoscopy.  Start PPI therapy.  Concerned about possible esophageal or GI disease such as ulceration Dysphagia - check thyroid ultrasound because of history of thyroid disease and dysphagia and lower throat.  GI to see    LOS: 2 days   Cindy [name] 10/19/2012, 8:09 AM

## 2012-10-19 NOTE — Care Management Note (Signed)
    Page 1 of 1   10/19/2012     6:27:09 PM   CARE MANAGEMENT NOTE 10/19/2012  Patient:  Cindy [name], Cindy [name]   Account Number:  [id number]  Date Initiated:  10/19/2012  Documentation initiated by:  Colleen Can  Subjective/Objective Assessment:   DX GENERALIZED WEAKNESS     Action/Plan:   CM SPOKE WITH PATIENT. PLANSS ARE FOR PATIENT TO RETURN TO HER HOME IN [place] WHERE FRIEND AND ADULT CHILDREN WILL BE CAREGIVERS. SHE DOES NOT NEED DME. PT IS AMBULATORY W/RW  NO HH ORDERS AT THIS TIME, PT IS RECOMMENDING HHPT   Anticipated DC Date:  10/20/2012   Anticipated DC Plan:  HOME/SELF CARE  In-house referral  NA      DC Planning Services  CM consult      Choice offered to / List presented to:             Status of service:  In process, will continue to follow Medicare Important Message given?   (If response is "NO", the following Medicare IM given date fields will be blank) Date Medicare IM given:   Date Additional Medicare IM given:    Discharge Disposition:    Per UR Regulation:    If discussed at Long Length of Stay Meetings, dates discussed:    Comments:  10/19/2012 Raynelle Bring BSN CCM [phone] LIST OF HH AGENCIES GIVEN FOR PATIENT TO REVIEW. CM WILL SET UP HH SERVICES AS ORDERS ARE WRITTEN. CM WILL FOLLOW

## 2012-10-19 NOTE — Evaluation (Signed)
Physical Therapy Evaluation Patient Details Name: Cindy [name] MRN: [id number] DOB: Feb 19, 1930 Today's Date: 10/19/2012 Time: 4401-0272 PT Time Calculation (min): 30 min  PT Assessment / Plan / Recommendation Clinical Impression  78 yo female admitted with weakness and inability to walk who has improved, but still demonstrating balance deficit requiring use of RW.  She will benefit from HHPT at home when she returns with assist from daughters    PT Assessment  Patient needs continued PT services    Follow Up Recommendations  Home health PT    Does the patient have the potential to tolerate intense rehabilitation      Barriers to Discharge None      Equipment Recommendations  None recommended by PT    Recommendations for Other Services OT consult   Frequency Min 3X/week    Precautions / Restrictions Precautions Precautions: Fall Restrictions Weight Bearing Restrictions: No   Pertinent Vitals/Pain Pt c/o pain in left thumb      Mobility  Bed Mobility Bed Mobility: Rolling Right;Rolling Left Rolling Right: 7: Independent Rolling Left: 7: Independent Transfers Transfers: Sit to Stand;Stand to Sit Sit to Stand: 5: Supervision;From chair/3-in-1 Stand to Sit: 5: Supervision;To toilet Details for Transfer Assistance: S overall for all transfers. Ambulation/Gait Ambulation/Gait Assistance: 4: Min assist Ambulation Distance (Feet): 200 Feet Assistive device: Rolling walker;1 person hand held assist Ambulation/Gait Assistance Details: assist for balance as pt has mildly ataxic gait without any support at all. Gait Pattern: Step-through pattern;Decreased trunk rotation;Ataxic Gait velocity: decreased General Gait Details: pt maintains trunk in extension, but lacks rotation.  She has diminished reciprocol arm swing. She demonstrates some ataxia when walking without device Stairs: No Wheelchair Mobility Wheelchair Mobility: No    Shoulder Instructions     Exercises  Other Exercises Other Exercises: repeated sit to stand x 5 repetitions.  Pt had difficulty with remembering to push up with arms consistently Pt fatigued at end of session   PT Diagnosis: Difficulty walking;Abnormality of gait;Generalized weakness  PT Problem List: Decreased strength;Decreased balance;Decreased activity tolerance;Decreased safety awareness PT Treatment Interventions: DME instruction;Gait training;Stair training;Therapeutic exercise;Balance training;Therapeutic activities   PT Goals Acute Rehab PT Goals PT Goal Formulation: With patient/family Time For Goal Achievement: 11/02/12 Potential to Achieve Goals: Good Pt will go Supine/Side to Sit: Independently PT Goal: Supine/Side to Sit - Progress: Goal set today Pt will go Sit to Supine/Side: Independently PT Goal: Sit to Supine/Side - Progress: Goal set today Pt will go Sit to Stand: Independently PT Goal: Sit to Stand - Progress: Goal set today Pt will go Stand to Sit: Independently PT Goal: Stand to Sit - Progress: Goal set today Pt will Ambulate: >150 feet;with least restrictive assistive device;with modified independence PT Goal: Ambulate - Progress: Goal set today Pt will Go Up / Down Stairs: 3-5 stairs;with supervision PT Goal: Up/Down Stairs - Progress: Goal set today  Visit Information  Last PT Received On: 10/19/12 Assistance Needed: +1    Subjective Data  Subjective: 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, sports at one time Patient Stated Goal: to return to former level of independence   Prior Functioning  Home Living Lives With: Alone Available Help at Discharge: Family Type of Home: House Home Access: Stairs to enter Secretary/administrator of Steps: 5 Entrance Stairs-Rails: Right;Left Home Layout: One level Bathroom Shower/Tub: Forensic scientist: Handicapped height Bathroom Accessibility: Yes How Accessible: Accessible via walker Home Adaptive Equipment:  Walker - rolling Additional Comments: pt had previous knee surgery and had been  independent on a walker at that time Prior Function Level of Independence: Independent Able to Take Stairs?: Yes Driving: Yes Vocation: Retired Comments: "I"m VERY independent" Communication Communication: No difficulties Dominant Hand: Left    Cognition  Overall Cognitive Status: Appears within functional limits for tasks assessed/performed Arousal/Alertness: Awake/alert Orientation Level: Oriented X4 / Intact Behavior During Session: [facility] for tasks performed Cognition - Other Comments: Intact.    Extremity/Trunk Assessment Right Upper Extremity Assessment RUE ROM/Strength/Tone: Within functional levels RUE Sensation: WFL - Light Touch RUE Coordination: WFL - gross/fine motor Left Upper Extremity Assessment LUE ROM/Strength/Tone: Deficits;Due to pain LUE ROM/Strength/Tone Deficits: Pt with difficulty ab and adducting thumb.  Pt states she had IV locatd here and she feels pain is coming from this. LUE Sensation: WFL - Light Touch LUE Coordination: Deficits LUE Coordination Deficits: Pt with mild coordiation deficits due to pain in L thumb. Right Lower Extremity Assessment RLE ROM/Strength/Tone: [facility] for tasks assessed Left Lower Extremity Assessment LLE ROM/Strength/Tone: [facility] for tasks assessed Trunk Assessment Trunk Assessment: Normal   Balance Balance Balance Assessed: Yes Static Sitting Balance Static Sitting - Balance Support: No upper extremity supported Static Sitting - Level of Assistance: 7: Independent Static Standing Balance Static Standing - Balance Support: Bilateral upper extremity supported Static Standing - Level of Assistance: 5: Stand by assistance Static Standing - Comment/# of Minutes: pt unable to stand for greater than 15 seconds without assist   End of Session PT - End of Session Activity Tolerance: Patient limited by fatigue Patient left: in chair;with family/visitor  present Nurse Communication: Mobility status  GP     Bayard Hugger. [place], [place] [phone] 10/19/2012, 1:14 PM

## 2012-10-19 NOTE — Evaluation (Signed)
Occupational Therapy Evaluation Patient Details Name: Cindy [name] MRN: [id number] DOB: 1930-02-16 Today's Date: 10/19/2012 Time: 4098-1191 OT Time Calculation (min): 24 min  OT Assessment / Plan / Recommendation Clinical Impression  Pt admitted for work up for nausea and now has pain in L thumb as well with deficits listed below.  Pt would benefit from acute OT to reach I level with basic adls so she will be safe at home alone.    OT Assessment  Patient needs continued OT Services    Follow Up Recommendations  No OT follow up;Other (comment) (unless L hand does not improve, then OPOT.)    Barriers to Discharge None Daugthter can be with pt after d/c if needed.  Equipment Recommendations  None recommended by OT    Recommendations for Other Services    Frequency  Min 2X/week    Precautions / Restrictions Precautions Precautions: None Restrictions Weight Bearing Restrictions: No   Pertinent Vitals/Pain Pt c/o pain in L thumb area where IV was recently removed.    ADL  Eating/Feeding: Performed;Set up Where Assessed - Eating/Feeding: Chair Grooming: Performed;Wash/dry hands;Teeth care;Supervision/safety Where Assessed - Grooming: Supported standing Upper Body Bathing: Simulated;Set up Where Assessed - Upper Body Bathing: Unsupported sitting Lower Body Bathing: Simulated;Min guard Where Assessed - Lower Body Bathing: Unsupported sit to stand Upper Body Dressing: Simulated;Set up Where Assessed - Upper Body Dressing: Unsupported sitting Lower Body Dressing: Minimal assistance;Performed;Other (comment) (pain in L thumb pulling pants up.) Where Assessed - Lower Body Dressing: Unsupported sit to stand Toilet Transfer: Performed;Supervision/safety Toilet Transfer Method: Sit to Barista: Comfort height toilet;Grab bars Toileting - Clothing Manipulation and Hygiene: Performed;Set up Where Assessed - Toileting Clothing Manipulation and Hygiene:  Standing Transfers/Ambulation Related to ADLs: Pt walked w/o assistive device to bathroom to do adls with min guard there and S on way back to chair. ADL Comments: Pts only limitation is pain in her L thumb.  Pt reports an IV was located there and now has difficulty squeezing and using L thumb.    OT Diagnosis: Generalized weakness;Acute pain  OT Problem List: Decreased strength;Decreased range of motion;Decreased coordination;Impaired UE functional use;Pain OT Treatment Interventions: Self-care/ADL training;Therapeutic exercise   OT Goals Acute Rehab OT Goals OT Goal Formulation: With patient/family Time For Goal Achievement: 11/02/12 Potential to Achieve Goals: Good ADL Goals Pt Will Perform Lower Body Bathing: Independently;Sit to stand in shower ADL Goal: Lower Body Bathing - Progress: Goal set today Pt Will Perform Lower Body Dressing: Independently;Sit to stand from chair (gathering all clothes Ily.) ADL Goal: Lower Body Dressing - Progress: Goal set today Pt Will Perform Tub/Shower Transfer: Shower transfer;Tub transfer;Independently ADL Goal: Tub/Shower Transfer - Progress: Goal set today Additional ADL Goal #1: Pt will complete all aspects of toileting on comfort comode with mod I. ADL Goal: Additional Goal #1 - Progress: Goal set today Arm Goals Additional Arm Goal #1: Pt will do ROM exercises with L thumb as well as [place] L hand for all adls w/o cues. Arm Goal: Additional Goal #1 - Progress: Goal set today  Visit Information  Last OT Received On: 10/19/12 Assistance Needed: +1    Subjective Data  Subjective: "I feel good today." Patient Stated Goal: to go home and be I again.   Prior Functioning     Home Living Lives With: Alone Available Help at Discharge: Family Type of Home: House Home Access: Stairs to enter [employer] of Steps: 5 Entrance Stairs-Rails: Right Home Layout: One level Bathroom Shower/Tub:  Tub/shower unit;Curtain Bathroom Toilet:  Handicapped height Bathroom Accessibility: Yes How Accessible: Accessible via walker Prior Function Level of Independence: Independent Able to Take Stairs?: Yes Driving: Yes Vocation: Retired Musician: No difficulties Dominant Hand: Left         Vision/Perception Vision - Assessment Vision Assessment: Vision not tested   Cognition  Overall Cognitive Status: Appears within functional limits for tasks assessed/performed Arousal/Alertness: Awake/alert Orientation Level: Oriented X4 / Intact Behavior During Session: WFL for tasks performed Cognition - Other Comments: Intact.    Extremity/Trunk Assessment Right Upper Extremity Assessment RUE ROM/Strength/Tone: Within functional levels RUE Sensation: WFL - Light Touch RUE Coordination: WFL - gross/fine motor Left Upper Extremity Assessment LUE ROM/Strength/Tone: Deficits;Due to pain LUE ROM/Strength/Tone Deficits: Pt with difficulty ab and adducting thumb.  Pt states she had IV locatd here and she feels pain is coming from this. LUE Sensation: WFL - Light Touch LUE Coordination: Deficits LUE Coordination Deficits: Pt with mild coordiation deficits due to pain in L thumb. Trunk Assessment Trunk Assessment: Normal     Mobility Transfers Transfers: Sit to Stand;Stand to Sit Sit to Stand: 5: Supervision;From chair/3-in-1 Stand to Sit: 5: Supervision;To toilet Details for Transfer Assistance: S overall for all transfers.     Shoulder Instructions     Exercise     Balance     End of Session OT - End of Session Activity Tolerance: Patient tolerated treatment well Patient left: in chair;with call bell/phone within reach;with family/visitor present Nurse Communication: Mobility status  GO Functional Assessment Tool Used: adl observation Functional Limitation: Self care Self Care Current Status (Z6109): At least 1 percent but less than 20 percent impaired, limited or restricted Self Care Goal Status  (U0454): 0 percent impaired, limited or restricted   Cindy [name] 10/19/2012, 12:12 PM [phone]

## 2012-10-20 ENCOUNTER — Encounter (HOSPITAL_COMMUNITY)
Admission: EM | Disposition: A | Discharge status: Home / Self Care | Payer: Self-pay | Source: Home / Self Care | Attending: Emergency Medicine

## 2012-10-20 ENCOUNTER — Encounter (HOSPITAL_COMMUNITY): Payer: Self-pay

## 2012-10-20 DIAGNOSIS — R112 Nausea with vomiting, unspecified: Secondary | ICD-10-CM | POA: Diagnosis not present

## 2012-10-20 DIAGNOSIS — D131 Benign neoplasm of stomach: Secondary | ICD-10-CM | POA: Diagnosis not present

## 2012-10-20 DIAGNOSIS — K2970 Gastritis, unspecified, without bleeding: Secondary | ICD-10-CM | POA: Diagnosis not present

## 2012-10-20 DIAGNOSIS — K2990 Gastroduodenitis, unspecified, without bleeding: Secondary | ICD-10-CM | POA: Diagnosis not present

## 2012-10-20 HISTORY — PX: ESOPHAGOGASTRODUODENOSCOPY: SHX5428

## 2012-10-20 LAB — BASIC METABOLIC PANEL
BUN: 7 mg/dL (ref 6–23)
CO2: 22 mEq/L (ref 19–32)
Chloride: 101 mEq/L (ref 96–112)
Creatinine, Ser: 0.71 mg/dL (ref 0.50–1.10)
Potassium: 3.5 mEq/L (ref 3.5–5.1)

## 2012-10-20 LAB — T4, FREE: Free T4: 1.01 ng/dL (ref 0.80–1.80)

## 2012-10-20 SURGERY — EGD (ESOPHAGOGASTRODUODENOSCOPY)
Anesthesia: Moderate Sedation | Laterality: Left

## 2012-10-20 MED ORDER — FENTANYL CITRATE 0.05 MG/ML IJ SOLN
INTRAMUSCULAR | Status: AC
  Filled 2012-10-20: qty 2

## 2012-10-20 MED ORDER — SODIUM CHLORIDE 0.9 % IV SOLN: INTRAVENOUS | Status: DC

## 2012-10-20 MED ORDER — ALPRAZOLAM 0.5 MG PO TABS
0.5000 mg | ORAL_TABLET | Freq: Every evening | ORAL | Status: DC | PRN
  Administered 2012-10-20: 0.5 mg via ORAL
  Filled 2012-10-20: qty 1

## 2012-10-20 MED ORDER — BUTAMBEN-TETRACAINE-BENZOCAINE 2-2-14 % EX AERO
INHALATION_SPRAY | CUTANEOUS | Status: DC | PRN
  Administered 2012-10-20: 2 via TOPICAL

## 2012-10-20 MED ORDER — FENTANYL CITRATE 0.05 MG/ML IJ SOLN
INTRAMUSCULAR | Status: DC | PRN
  Administered 2012-10-20 (×2): 25 ug via INTRAVENOUS

## 2012-10-20 MED ORDER — POTASSIUM CHLORIDE IN NACL 20-0.9 MEQ/L-% IV SOLN
INTRAVENOUS | Status: DC
  Administered 2012-10-20 – 2012-10-21 (×2): via INTRAVENOUS
  Filled 2012-10-20 (×4): qty 1000

## 2012-10-20 MED ORDER — MIDAZOLAM HCL 10 MG/2ML IJ SOLN
INTRAMUSCULAR | Status: AC
  Filled 2012-10-20: qty 2

## 2012-10-20 MED ORDER — ALPRAZOLAM 0.25 MG PO TABS
0.2500 mg | ORAL_TABLET | Freq: Two times a day (BID) | ORAL | Status: DC | PRN
  Filled 2012-10-20: qty 1

## 2012-10-20 MED ORDER — MIDAZOLAM HCL 10 MG/2ML IJ SOLN
INTRAMUSCULAR | Status: DC | PRN
  Administered 2012-10-20 (×2): 2 mg via INTRAVENOUS

## 2012-10-20 NOTE — Progress Notes (Signed)
Endoscopy today revealed no cause for patient's symptoms.  Will obtain gastric emptying study tomorrow.  If this is normal, would likely not pursue any further GI tract testing at this time.

## 2012-10-20 NOTE — H&P (View-Only) (Signed)
Subjective: Cindy [name] continues to feel better.  Ambien does not work for sleep but the Xanax really seems to help.  I think that quite possibly anxiety with hyperventilation, chronic, may have caused some of her symptoms though her bicarbonate was not chronically elevated.  The chronic nausea needs to be further worked up and I appreciate Dr. outlaw's help with upper endoscopy today.  She was able to keep cream potatoes and grits yesterday without incident.  Her left hand was burning where she had an IV yesterday and Toradol resolved back pain with just one dose.  Uric acid was normal. Interesting that she seemed to improve the most simply with alprazolam but it may be that he PPI therapy has helped as well  Objective: Weight change:   Intake/Output Summary (Last 24 hours) at 10/20/12 0734 Last data filed at 10/19/12 2200  Gross per 24 hour  Intake   1400 ml  Output      0 ml  Net   1400 ml   Filed Vitals:   10/19/12 0451 10/19/12 1251 10/19/12 2206 10/20/12 0614  BP: 124/50 120/58 113/41 117/46  Pulse: 62 68 57 58  Temp: 98.2 F (36.8 C) 98.3 F (36.8 C) 97.9 F (36.6 C) 98 F (36.7 C)  TempSrc: Oral  Oral Oral  Resp: 18 18 18 18  Height:      Weight:      SpO2: 94% 96% 99% 98%   General Appearance: Comfortable this morning and not hyperventilating. Conversive and calm. Does report fullness and lower anterior throat with swallowing and is concerned about chronic nausea.  Nausea is better today.  She is on PPI therapy  Head: Normocephalic, without obvious abnormality, atraumatic  Neck: Supple, symmetrical, not complaining of neck pain currently  Lungs: Clear to auscultation bilaterally, respirations unlabored  Chest: Mildly tender to palpation over the lower sternum  Heart: Regular rate and rhythm, S1 and S2 normal, no murmur, rub or gallop  Abdomen: Soft, non-tender, bowel sounds active all four quadrants, no masses, no organomegaly  Extremities: Extremities normal,  atraumatic, no cyanosis or edema  Pulses: 2+ and symmetric all extremities  Skin: Skin color, texture, turgor normal, no rashes or lesions  Neuro: CNII-XII intact. Normal strength, sensation and reflexes throughout   Lab Results:  Basename 10/20/12 0430 10/19/12 0406  NA 133* 131*  K 3.5 3.5  CL 101 98  CO2 22 21  GLUCOSE 95 126*  BUN 7 9  CREATININE 0.71 0.78  CALCIUM 8.6 8.6  MG -- --  PHOS -- --    Basename 10/18/12 0418 10/17/12 1425  AST 19 17  ALT 15 15  ALKPHOS 54 52  BILITOT 0.7 0.3  PROT 6.8 6.8  ALBUMIN 3.5 3.5    Basename 10/17/12 1425  LIPASE 27  AMYLASE --    Basename 10/18/12 0418 10/17/12 1425  WBC 8.2 7.7  NEUTROABS -- 3.9  HGB 12.9 12.9  HCT 37.0 36.3  MCV 87.3 87.9  PLT 306 291    Basename 10/17/12 1425  CKTOTAL --  CKMB --  CKMBINDEX --  TROPONINI <0.30   No components found with this basename: POCBNP:3 No results found for this basename: DDIMER:2 in the last 72 hours No results found for this basename: HGBA1C:2 in the last 72 hours No results found for this basename: CHOL:2,HDL:2,LDLCALC:2,TRIG:2,CHOLHDL:2,LDLDIRECT:2 in the last 72 hours  Basename 10/17/12 1710 10/17/12 1705  TSH 0.362 --  T4TOTAL -- --  T3FREE -- 2.3  THYROIDAB -- --      Basename 10/17/12 1705  VITAMINB12 708  FOLATE --  FERRITIN --  TIBC --  IRON --  RETICCTPCT --    Studies/Results: Mr Mra Head Wo Contrast  10/18/2012  *RADIOLOGY REPORT*  Clinical Data: 78-year-old female with mental status change and headache.  History of right MCA aneurysm.  MRI HEAD WITHOUT AND WITH CONTRAST  Technique: Multiplanar, multiecho pulse sequences of the brain and surrounding structures were obtained according to standard protocol without and with intravenous contrast.  Contrast: 15mL MULTIHANCE GADOBENATE DIMEGLUMINE 529 MG/ML IV SOLN  Comparison: Brain MRI and MRA 06/14/2011.  Cerebral angiogram 06/20/2011.  Findings:  No restricted diffusion to suggest acute infarction.   No midline shift, mass effect, evidence of mass lesion, ventriculomegaly, extra-axial collection or acute intracranial hemorrhage.  Cervicomedullary junction and pituitary are within normal limits.  Major intracranial vascular flow voids are preserved.  Confluent cerebral white matter T2 and FLAIR hyperintensity extending from the corona radiata to the deep white matter capsules is not significantly changed.  Patchy pontine T2 and FLAIR hyperintensity also is stable.  T2 heterogeneity in the deep gray matter nuclei is stable. No abnormal enhancement identified.  No new signal abnormality.  Negative visualized cervical spine. Visualized bone marrow signal is within normal limits.  Stable orbits.  Stable paranasal sinuses and mastoids.  Negative scalp soft tissues.  IMPRESSION: 1. No acute intracranial abnormality.  Stable MRI appearance of the brain. 2.  See MRA findings below.  MRA HEAD WITHOUT CONTRAST  Technique: Angiographic images of the Circle of Willis were obtained using MRA technique without  intravenous contrast.  Findings:  Antegrade flow in the posterior circulation.  Stable dominant distal left vertebral artery.  Normal right PICA.  Stable and normal vertebrobasilar junction.  Stable mild mid basilar artery irregularity without significant stenosis.  SCA and PCA origins are stable within normal limits.  Stable bilateral PCA branches, normal except for mild irregularity at the right bifurcation.  Antegrade flow in both ICA siphons.  Small 2-3 mm laterally directed saccular lesion from the right cavernous ICA is stable. Ophthalmic artery origins remain normal.  No significant ICA stenosis.  Carotid termini, MCA and ACA origins remain normal. Posterior communicating arteries are diminutive or absent. Anterior communicating artery and visualized ACA branches remain within normal limits.  Visualized left MCA branches remain within normal limits.  Right MCA M1 segment is patent with mild to moderate  irregularity as before.  Superiorly directed right MCA M1 segment aneurysm re- identified arising just beyond the right lenticulostriate and right anterior temporal artery origins.  This has not significantly changed in size measuring 4 x 5 x 6 mm as before.  The more distal right M1 segment remains patent.  Right MCA bifurcation is stable with mild irregularity.  There is a second smaller 1-2 mm aneurysm at the right MCA bifurcation which was less evident on the prior. Visualized right MCA branches are stable.  IMPRESSION: 1.  Stable right MCA M1 segment aneurysm directed superiorly measuring 4 x 5 x 6 mm. 2.  A second smaller right MCA bifurcation aneurysm measuring 2 x 3 mm is better demonstrated on the study, but probably not significantly changed since 2012. 3.  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 4.  No new intracranial MRA abnormality.   Original Report Authenticated By: H.LEE HALL III, M.D.    Mr Brain W Wo Contrast  10/18/2012  *RADIOLOGY REPORT*  Clinical Data: 78-year-old female with mental status change   and headache.  History of right MCA aneurysm.  MRI HEAD WITHOUT AND WITH CONTRAST  Technique: Multiplanar, multiecho pulse sequences of the brain and surrounding structures were obtained according to standard protocol without and with intravenous contrast.  Contrast: 15mL MULTIHANCE GADOBENATE DIMEGLUMINE 529 MG/ML IV SOLN  Comparison: Brain MRI and MRA 06/14/2011.  Cerebral angiogram 06/20/2011.  Findings:  No restricted diffusion to suggest acute infarction.  No midline shift, mass effect, evidence of mass lesion, ventriculomegaly, extra-axial collection or acute intracranial hemorrhage.  Cervicomedullary junction and pituitary are within normal limits.  Major intracranial vascular flow voids are preserved.  Confluent cerebral white matter T2 and FLAIR hyperintensity extending from the corona radiata to the deep white matter  capsules is not significantly changed.  Patchy pontine T2 and FLAIR hyperintensity also is stable.  T2 heterogeneity in the deep gray matter nuclei is stable. No abnormal enhancement identified.  No new signal abnormality.  Negative visualized cervical spine. Visualized bone marrow signal is within normal limits.  Stable orbits.  Stable paranasal sinuses and mastoids.  Negative scalp soft tissues.  IMPRESSION: 1. No acute intracranial abnormality.  Stable MRI appearance of the brain. 2.  See MRA findings below.  MRA HEAD WITHOUT CONTRAST  Technique: Angiographic images of the Circle of Willis were obtained using MRA technique without  intravenous contrast.  Findings:  Antegrade flow in the posterior circulation.  Stable dominant distal left vertebral artery.  Normal right PICA.  Stable and normal vertebrobasilar junction.  Stable mild mid basilar artery irregularity without significant stenosis.  SCA and PCA origins are stable within normal limits.  Stable bilateral PCA branches, normal except for mild irregularity at the right bifurcation.  Antegrade flow in both ICA siphons.  Small 2-3 mm laterally directed saccular lesion from the right cavernous ICA is stable. Ophthalmic artery origins remain normal.  No significant ICA stenosis.  Carotid termini, MCA and ACA origins remain normal. Posterior communicating arteries are diminutive or absent. Anterior communicating artery and visualized ACA branches remain within normal limits.  Visualized left MCA branches remain within normal limits.  Right MCA M1 segment is patent with mild to moderate irregularity as before.  Superiorly directed right MCA M1 segment aneurysm re- identified arising just beyond the right lenticulostriate and right anterior temporal artery origins.  This has not significantly changed in size measuring 4 x 5 x 6 mm as before.  The more distal right M1 segment remains patent.  Right MCA bifurcation is stable with mild irregularity.  There is a  second smaller 1-2 mm aneurysm at the right MCA bifurcation which was less evident on the prior. Visualized right MCA branches are stable.  IMPRESSION: 1.  Stable right MCA M1 segment aneurysm directed superiorly measuring 4 x 5 x 6 mm. 2.  A second smaller right MCA bifurcation aneurysm measuring 2 x 3 mm is better demonstrated on the study, but probably not significantly changed since 2012. 3.  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 4.  No new intracranial MRA abnormality.   Original Report Authenticated By: H.LEE HALL III, M.D.    Us Soft Tissue Head/neck  10/19/2012  *RADIOLOGY REPORT*  Clinical Data: Fullness in lower throat.  History of "thyroid disease." Partial thyroidectomy per patient report.  THYROID ULTRASOUND  Technique: Ultrasound examination of the thyroid gland and adjacent soft tissues was performed.  Comparison:  None.  Findings:  Right thyroid lobe:  2.7 x 1.0 x 1.0 cm Left thyroid lobe:    Not visualized, likely surgically absent. Isthmus:  3 mm  Focal nodules:  No dominant solid nodule or mass identified within the right lobe or isthmus of the thyroid.  Lymphadenopathy:  Small nodes identified bilaterally.  No adenopathy.  IMPRESSION:  1.  Lack of visualization of the left lobe of the thyroid, presumably surgically absent, given the patient history. 2. Diminutive right lobe of the thyroid, without evidence of dominant nodule or mass.   Original Report Authenticated By: KYLE D. TALBOT, M.D.    Medications: Scheduled Meds:   . amLODipine  5 mg Oral Daily  . cholecalciferol  1,000 Units Oral Daily  . enoxaparin (LOVENOX) injection  40 mg Subcutaneous Q24H  . estrogens (conjugated)  0.625 mg Oral Daily  . FLUoxetine  40 mg Oral Daily  . irbesartan  75 mg Oral Daily  . ketorolac  30 mg Intravenous Once  . levothyroxine  100 mcg Oral Daily  . ondansetron (ZOFRAN) IV  4 mg Intravenous Q8H  . pantoprazole  40 mg Oral Daily   . pneumococcal 23 valent vaccine  0.5 mL Intramuscular Tomorrow-1000  . DISCONTD: potassium chloride  40 mEq Oral Once   Continuous Infusions:   . 0.9 % NaCl with KCl 20 mEq / L 75 mL/hr at 10/19/12 2309   PRN Meds:.acetaminophen, acetaminophen, ALPRAZolam, senna-docusate, temazepam, zolpidem, DISCONTD: ondansetron (ZOFRAN) IV, DISCONTD: ondansetron  Assessment/Plan:   Generalized Weakness/Fatigue  -Etiology unclear. The thyroid function at this time is normal. Will continue on thyroid replacement therapy, 100 micrograms daily.  B12 and RPR are normal and negative respectively. Mild hyponatremia has corrected with IV fluids. Doubt this was contributory.  -She also has a h/o depression, and with her recent mood changes, I wonder if this could be the cause. We need to r/o organic causes first. Feels better and seems to have occurred after taking Xanax. She is not hyperventilating currently  - No evidence of stroke on MRI MRA of the brain. Carotid Dopplers are nonocclusive. 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  Continue alprazolam twice a day for now, one in a.m. and one at bedtime  Will get PT/OT to assess her for further recommendations.  Hyponatremia - resolved  Hypokalemia - resolved  Hypothyroidism - Thyroid function currently is normal. Synthroid has been resumed at 100 micrograms daily and will follow thyroid function carefully.  Check cortisol level and repeat thyroid function to be sure about hormonal normalcy.  Calcium levels have been normal  DVT Prophylaxis  -Lovenox.  Anxiety/agitation - will try Xanax 0.25 milligrams every 12 hours   Insomnia - Xanax 0.5 milligrams at bedtime  History of depression - continue Prozac 40 milligrams daily  Hypertension - continue ACE receptor blockade  Estrogen replacement therapy - continue for now  Nausea - chronic by history - appreciate consult by Dr. outlaw.  Upper endoscopy today.  Continue PPI therapy.  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  Right thyroid was diminutive in size without nodules or other abnormality.  We'll be obtaining an upper endoscopy today   LOS: 3 days   Bentleigh Stankus NEVILL 10/20/2012, 7:34 AM   

## 2012-10-20 NOTE — Progress Notes (Signed)
Physical Therapy Note   10/19/12 1314  PT G-Codes **NOT FOR INPATIENT CLASS**  Functional Assessment Tool Used clinical judgement as reviewed through chart documentation of evaluation  Functional Limitation Mobility: Walking and moving around  Mobility: Walking and Moving Around Current Status (Z6109) CJ  Mobility: Walking and Moving Around Goal Status (U0454) CI  PT General Charges  $$ ACUTE PT VISIT 1 Procedure  PT Evaluation  $Initial PT Evaluation Tier I 1 Procedure  PT Treatments  $Gait Training 8-22 mins  $Therapeutic Exercise 8-22 mins   Evaluation performed by Ebony Hail, PT.  G-codes entered by chart review. Zenovia Jarred, PT Pager: [phone]

## 2012-10-20 NOTE — Progress Notes (Signed)
Subjective: Cindy [name] continues to feel better.  Ambien does not work for sleep but the Xanax really seems to help.  I think that quite possibly anxiety with hyperventilation, chronic, may have caused some of her symptoms though her bicarbonate was not chronically elevated.  The chronic nausea needs to be further worked up and I appreciate Dr. Hulen Shouts help with upper endoscopy today.  She was able to keep cream potatoes and grits yesterday without incident.  Her left hand was burning where she had an IV yesterday and Toradol resolved back pain with just one dose.  Uric acid was normal. Interesting that she seemed to improve the most simply with alprazolam but it may be that he PPI therapy has helped as well  Objective: Weight change:   Intake/Output Summary (Last 24 hours) at 10/20/12 0734 Last data filed at 10/19/12 2200  Gross per 24 hour  Intake   1400 ml  Output      0 ml  Net   1400 ml   Filed Vitals:   10/19/12 0451 10/19/12 1251 10/19/12 2206 10/20/12 0614  BP: 124/50 120/58 113/41 117/46  Pulse: 62 68 57 58  Temp: 98.2 F (36.8 C) 98.3 F (36.8 C) 97.9 F (36.6 C) 98 F (36.7 C)  TempSrc: Oral  Oral Oral  Resp: 18 18 18 18   Height:      Weight:      SpO2: 94% 96% 99% 98%   General Appearance: Comfortable this morning and not hyperventilating. Conversive and calm. Does report fullness and lower anterior throat with swallowing and is concerned about chronic nausea.  Nausea is better today.  She is on PPI therapy  Head: Normocephalic, without obvious abnormality, atraumatic  Neck: Supple, symmetrical, not complaining of neck pain currently  Lungs: Clear to auscultation bilaterally, respirations unlabored  Chest: Mildly tender to palpation over the lower sternum  Heart: Regular rate and rhythm, S1 and S2 normal, no murmur, rub or gallop  Abdomen: Soft, non-tender, bowel sounds active all four quadrants, no masses, no organomegaly  Extremities: Extremities normal,  atraumatic, no cyanosis or edema  Pulses: 2+ and symmetric all extremities  Skin: Skin color, texture, turgor normal, no rashes or lesions  Neuro: CNII-XII intact. Normal strength, sensation and reflexes throughout   Lab Results:  Basename 10/20/12 0430 10/19/12 0406  NA 133* 131*  K 3.5 3.5  CL 101 98  CO2 22 21  GLUCOSE 95 126*  BUN 7 9  CREATININE 0.71 0.78  CALCIUM 8.6 8.6  MG -- --  PHOS -- --    Basename 10/18/12 0418 10/17/12 1425  AST 19 17  ALT 15 15  ALKPHOS 54 52  BILITOT 0.7 0.3  PROT 6.8 6.8  ALBUMIN 3.5 3.5    Basename 10/17/12 1425  LIPASE 27  AMYLASE --    Basename 10/18/12 0418 10/17/12 1425  WBC 8.2 7.7  NEUTROABS -- 3.9  HGB 12.9 12.9  HCT 37.0 36.3  MCV 87.3 87.9  PLT 306 291    Basename 10/17/12 1425  CKTOTAL --  CKMB --  CKMBINDEX --  TROPONINI <0.30   No components found with this basename: POCBNP:3 No results found for this basename: DDIMER:2 in the last 72 hours No results found for this basename: HGBA1C:2 in the last 72 hours No results found for this basename: CHOL:2,HDL:2,LDLCALC:2,TRIG:2,CHOLHDL:2,LDLDIRECT:2 in the last 72 hours  Basename 10/17/12 1710 10/17/12 1705  TSH 0.362 --  T4TOTAL -- --  T3FREE -- 2.3  THYROIDAB -- --  Basename 10/17/12 1705  VITAMINB12 708  FOLATE --  FERRITIN --  TIBC --  IRON --  RETICCTPCT --    Studies/Results: Mr Cindy [name] Wo Contrast  10/18/2012  *RADIOLOGY REPORT*  Clinical Data: 78 year old female with mental status change and headache.  History of right MCA aneurysm.  MRI HEAD WITHOUT AND WITH CONTRAST  Technique: Multiplanar, multiecho pulse sequences of the brain and surrounding structures were obtained according to standard protocol without and with intravenous contrast.  Contrast: 15mL MULTIHANCE GADOBENATE DIMEGLUMINE 529 MG/ML IV SOLN  Comparison: Brain MRI and MRA 06/14/2011.  Cerebral angiogram 06/20/2011.  Findings:  No restricted diffusion to suggest acute infarction.   No midline shift, mass effect, evidence of mass lesion, ventriculomegaly, extra-axial collection or acute intracranial hemorrhage.  Cervicomedullary junction and pituitary are within normal limits.  Major intracranial vascular flow voids are preserved.  Confluent cerebral white matter T2 and FLAIR hyperintensity extending from the corona radiata to the deep white matter capsules is not significantly changed.  Patchy pontine T2 and FLAIR hyperintensity also is stable.  T2 heterogeneity in the deep gray matter nuclei is stable. No abnormal enhancement identified.  No new signal abnormality.  Negative visualized cervical spine. Visualized bone marrow signal is within normal limits.  Stable orbits.  Stable paranasal sinuses and mastoids.  Negative scalp soft tissues.  IMPRESSION: 1. No acute intracranial abnormality.  Stable MRI appearance of the brain. 2.  See MRA findings below.  MRA HEAD WITHOUT CONTRAST  Technique: Angiographic images of the Circle of Willis were obtained using MRA technique without  intravenous contrast.  Findings:  Antegrade flow in the posterior circulation.  Stable dominant distal left vertebral artery.  Normal right PICA.  Stable and normal vertebrobasilar junction.  Stable mild mid basilar artery irregularity without significant stenosis.  SCA and PCA origins are stable within normal limits.  Stable bilateral PCA branches, normal except for mild irregularity at the right bifurcation.  Antegrade flow in both ICA siphons.  Small 2-3 mm laterally directed saccular lesion from the right cavernous ICA is stable. Ophthalmic artery origins remain normal.  No significant ICA stenosis.  Carotid termini, MCA and ACA origins remain normal. Posterior communicating arteries are diminutive or absent. Anterior communicating artery and visualized ACA branches remain within normal limits.  Visualized left MCA branches remain within normal limits.  Right MCA M1 segment is patent with mild to moderate  irregularity as before.  Superiorly directed right MCA M1 segment aneurysm re- identified arising just beyond the right lenticulostriate and right anterior temporal artery origins.  This has not significantly changed in size measuring 4 x 5 x 6 mm as before.  The more distal right M1 segment remains patent.  Right MCA bifurcation is stable with mild irregularity.  There is a second smaller 1-2 mm aneurysm at the right MCA bifurcation which was less evident on the prior. Visualized right MCA branches are stable.  IMPRESSION: 1.  Stable right MCA M1 segment aneurysm directed superiorly measuring 4 x 5 x 6 mm. 2.  A second smaller right MCA bifurcation aneurysm measuring 2 x 3 mm is better demonstrated on the study, but probably not significantly changed since 2012. 3.  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 4.  No new intracranial MRA abnormality.   Original Report Authenticated By: Harley Hallmark, M.D.    Mr Cindy [name] Wo Contrast  10/18/2012  *RADIOLOGY REPORT*  Clinical Data: 78 year old female with mental status change  and headache.  History of right MCA aneurysm.  MRI HEAD WITHOUT AND WITH CONTRAST  Technique: Multiplanar, multiecho pulse sequences of the brain and surrounding structures were obtained according to standard protocol without and with intravenous contrast.  Contrast: 15mL MULTIHANCE GADOBENATE DIMEGLUMINE 529 MG/ML IV SOLN  Comparison: Brain MRI and MRA 06/14/2011.  Cerebral angiogram 06/20/2011.  Findings:  No restricted diffusion to suggest acute infarction.  No midline shift, mass effect, evidence of mass lesion, ventriculomegaly, extra-axial collection or acute intracranial hemorrhage.  Cervicomedullary junction and pituitary are within normal limits.  Major intracranial vascular flow voids are preserved.  Confluent cerebral white matter T2 and FLAIR hyperintensity extending from the corona radiata to the deep white matter  capsules is not significantly changed.  Patchy pontine T2 and FLAIR hyperintensity also is stable.  T2 heterogeneity in the deep gray matter nuclei is stable. No abnormal enhancement identified.  No new signal abnormality.  Negative visualized cervical spine. Visualized bone marrow signal is within normal limits.  Stable orbits.  Stable paranasal sinuses and mastoids.  Negative scalp soft tissues.  IMPRESSION: 1. No acute intracranial abnormality.  Stable MRI appearance of the brain. 2.  See MRA findings below.  MRA HEAD WITHOUT CONTRAST  Technique: Angiographic images of the Circle of Willis were obtained using MRA technique without  intravenous contrast.  Findings:  Antegrade flow in the posterior circulation.  Stable dominant distal left vertebral artery.  Normal right PICA.  Stable and normal vertebrobasilar junction.  Stable mild mid basilar artery irregularity without significant stenosis.  SCA and PCA origins are stable within normal limits.  Stable bilateral PCA branches, normal except for mild irregularity at the right bifurcation.  Antegrade flow in both ICA siphons.  Small 2-3 mm laterally directed saccular lesion from the right cavernous ICA is stable. Ophthalmic artery origins remain normal.  No significant ICA stenosis.  Carotid termini, MCA and ACA origins remain normal. Posterior communicating arteries are diminutive or absent. Anterior communicating artery and visualized ACA branches remain within normal limits.  Visualized left MCA branches remain within normal limits.  Right MCA M1 segment is patent with mild to moderate irregularity as before.  Superiorly directed right MCA M1 segment aneurysm re- identified arising just beyond the right lenticulostriate and right anterior temporal artery origins.  This has not significantly changed in size measuring 4 x 5 x 6 mm as before.  The more distal right M1 segment remains patent.  Right MCA bifurcation is stable with mild irregularity.  There is a  second smaller 1-2 mm aneurysm at the right MCA bifurcation which was less evident on the prior. Visualized right MCA branches are stable.  IMPRESSION: 1.  Stable right MCA M1 segment aneurysm directed superiorly measuring 4 x 5 x 6 mm. 2.  A second smaller right MCA bifurcation aneurysm measuring 2 x 3 mm is better demonstrated on the study, but probably not significantly changed since 2012. 3.  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 4.  No new intracranial MRA abnormality.   Original Report Authenticated By: Harley Hallmark, M.D.    US Soft Tissue Head/neck  10/19/2012  *RADIOLOGY REPORT*  Clinical Data: Fullness in lower throat.  History of "thyroid disease." Partial thyroidectomy per patient report.  THYROID ULTRASOUND  Technique: Ultrasound examination of the thyroid gland and adjacent soft tissues was performed.  Comparison:  None.  Findings:  Right thyroid lobe:  2.7 x 1.0 x 1.0 cm Left thyroid lobe:  Not visualized, likely surgically absent. Isthmus:  3 mm  Focal nodules:  No dominant solid nodule or mass identified within the right lobe or isthmus of the thyroid.  Lymphadenopathy:  Small nodes identified bilaterally.  No adenopathy.  IMPRESSION:  1.  Lack of visualization of the left lobe of the thyroid, presumably surgically absent, given the patient history. 2. Diminutive right lobe of the thyroid, without evidence of dominant nodule or mass.   Original Report Authenticated By: Consuello Bossier, M.D.    Medications: Scheduled Meds:   . amLODipine  5 mg Oral Daily  . cholecalciferol  1,000 Units Oral Daily  . enoxaparin (LOVENOX) injection  40 mg Subcutaneous Q24H  . estrogens (conjugated)  0.625 mg Oral Daily  . FLUoxetine  40 mg Oral Daily  . irbesartan  75 mg Oral Daily  . ketorolac  30 mg Intravenous Once  . levothyroxine  100 mcg Oral Daily  . ondansetron (ZOFRAN) IV  4 mg Intravenous Q8H  . pantoprazole  40 mg Oral Daily   . pneumococcal 23 valent vaccine  0.5 mL Intramuscular Tomorrow-1000  . DISCONTD: potassium chloride  40 mEq Oral Once   Continuous Infusions:   . 0.9 % NaCl with KCl 20 mEq / L 75 mL/hr at 10/19/12 2309   PRN Meds:.acetaminophen, acetaminophen, ALPRAZolam, senna-docusate, temazepam, zolpidem, DISCONTD: ondansetron (ZOFRAN) IV, DISCONTD: ondansetron  Assessment/Plan:   Generalized Weakness/Fatigue  -Etiology unclear. The thyroid function at this time is normal. Will continue on thyroid replacement therapy, 100 micrograms daily.  B12 and RPR are normal and negative respectively. Mild hyponatremia has corrected with IV fluids. Doubt this was contributory.  -She also has a h/o depression, and with her recent mood changes, I wonder if this could be the cause. We need to r/o organic causes first. Feels better and seems to have occurred after taking Xanax. She is not hyperventilating currently  - No evidence of stroke on MRI MRA of the brain. Carotid Dopplers are nonocclusive. 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  Continue alprazolam twice a day for now, one in a.m. and one at bedtime  Will get PT/OT to assess her for further recommendations.  Hyponatremia - resolved  Hypokalemia - resolved  Hypothyroidism - Thyroid function currently is normal. Synthroid has been resumed at 100 micrograms daily and will follow thyroid function carefully.  Check cortisol level and repeat thyroid function to be sure about hormonal normalcy.  Calcium levels have been normal  DVT Prophylaxis  -Lovenox.  Anxiety/agitation - will try Xanax 0.25 milligrams every 12 hours   Insomnia - Xanax 0.5 milligrams at bedtime  History of depression - continue Prozac 40 milligrams daily  Hypertension - continue ACE receptor blockade  Estrogen replacement therapy - continue for now  Nausea - chronic by history - appreciate consult by Dr. Dulce Sellar.  Upper endoscopy today.  Continue PPI therapy.  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  Right thyroid was diminutive in size without nodules or other abnormality.  We'll be obtaining an upper endoscopy today   LOS: 3 days   Jahmil Macleod NEVILL 10/20/2012, 7:34 AM

## 2012-10-20 NOTE — Interval H&P Note (Signed)
History and Physical Interval Note:  10/20/2012 10:15 AM  Cindy [name]  has presented today for surgery, with the diagnosis of nausea, weight loss  The various methods of treatment have been discussed with the patient and family. After consideration of risks, benefits and other options for treatment, the patient has consented to  Procedure(s) (LRB) with comments: ESOPHAGOGASTRODUODENOSCOPY (EGD) (Left) as a surgical intervention .  The patient's history has been reviewed, patient examined, no change in status, stable for surgery.  I have reviewed the patient's chart and labs.  Questions were answered to the patient's satisfaction.     Elize Pinon M  Assessment:  1.  Nausea. 2.  Weight loss. 3.  Globus Sensation.  Plan:  1.  Upper endoscopy. 2.  Risks (bleeding, infection, bowel perforation that could require surgery, sedation-related changes in cardiopulmonary systems), benefits (identification and possible treatment of source of symptoms, exclusion of certain causes of symptoms), and alternatives (watchful waiting, radiographic imaging studies, empiric medical treatment) of upper endoscopy (EGD) were explained to patient in detail and patient wishes to proceed.

## 2012-10-20 NOTE — Op Note (Signed)
[facility] [address] [place], [address]   ENDOSCOPY PROCEDURE REPORT  PATIENT: Cindy [name], Cindy [name].  MR#: [id number] BIRTHDATE: 04-22-1930 , 82  yrs. old GENDER: Female ENDOSCOPIST: Willis Modena, MD REFERRED BY:  R. Robley Fries, M.D. PROCEDURE DATE:  10/20/2012 PROCEDURE:  EGD w/ biopsy ASA CLASS:     Class III INDICATIONS:  nausea, weight loss, globus sensation. MEDICATIONS: Fentanyl 50 mcg IV and Versed 4 mg IV TOPICAL ANESTHETIC: Cetacaine Spray  DESCRIPTION OF PROCEDURE: After the risks benefits and alternatives of the procedure were thoroughly explained, informed consent was obtained.  The Pentax Gastroscope [id number] endoscope was introduced through the mouth and advanced to the second portion of the duodenum. Without limitations.  The instrument was slowly withdrawn as the mucosa was fully examined.    Findings:  Normal esophagus; no inflammation, stricture, mass, varices.  Multiple sub-cm  polyps in the fundus, biopsied with cold forceps, most consistent with fundic gland hyperplasia. Otherwise normal stomach and pylorus.  Mild bulbar duodenitis, otherwise normal duodenum to the second portion.     Retroflexion into cardia normal except for gastric polyps.          The scope was then withdrawn from the patient and the procedure completed.  ENDOSCOPIC IMPRESSION:     As above.  No source of nausea or weight loss identified.  No esophageal finding to explain globus sensation.  RECOMMENDATIONS:     1.  Watch for potential complications of procedure. 2.  Await biopsies. 3.  Follow-up with [facility] GI inpatient team.  eSigned:  Willis Modena, MD 10/20/2012 10:58 AM   CC:

## 2012-10-20 NOTE — Progress Notes (Signed)
Physical Therapy Treatment Patient Details Name: Cindy [name] MRN: [id number] DOB: 02-09-1930 Today's Date: 10/20/2012 Time: 7829-5621 PT Time Calculation (min): 16 min  PT Assessment / Plan / Recommendation Comments on Treatment Session  pt to endo this am; feels fine now, not groggy, no c/o pain; progressing nicely; will benefit from HHPT    Follow Up Recommendations  Home health PT     Does the patient have the potential to tolerate intense rehabilitation     Barriers to Discharge        Equipment Recommendations  None recommended by PT    Recommendations for Other Services    Frequency Min 3X/week   Plan Discharge plan remains appropriate;Frequency remains appropriate    Precautions / Restrictions Precautions Precautions: Fall   Pertinent Vitals/Pain     Mobility  Bed Mobility Bed Mobility: Supine to Sit Supine to Sit: 6: Modified independent (Device/Increase time) Transfers Transfers: Sit to Stand;Stand to Sit Sit to Stand: 5: Supervision;From bed;From toilet;6: Modified independent (Device/Increase time) Stand to Sit: 5: Supervision;To chair/3-in-1;To toilet Details for Transfer Assistance: supervision for safety cues f=to control descent Ambulation/Gait Ambulation/Gait Assistance: 4: Min guard;5: Supervision Ambulation Distance (Feet): 400 Feet Assistive device: None Ambulation/Gait Assistance Details: cues for upward gaze Gait Pattern: Step-through pattern;Decreased trunk rotation;Ataxic General Gait Details: pt maintains trunk in extension, but lacks rotation.  She has diminished reciprocal arm swing. She demonstrates some very mild ataxia when walking without device    Exercises     PT Diagnosis:    PT Problem List:   PT Treatment Interventions:     PT Goals Acute Rehab PT Goals Time For Goal Achievement: 11/02/12 Potential to Achieve Goals: Good Pt will go Supine/Side to Sit: Independently PT Goal: Supine/Side to Sit - Progress: Progressing  toward goal Pt will go Sit to Stand: Independently PT Goal: Sit to Stand - Progress: Progressing toward goal Pt will go Stand to Sit: Independently PT Goal: Stand to Sit - Progress: Progressing toward goal Pt will Ambulate: >150 feet;with least restrictive assistive device;with modified independence PT Goal: Ambulate - Progress: Progressing toward goal  Visit Information  Last PT Received On: 10/20/12 Assistance Needed: +1    Subjective Data  Subjective: I feel ok Patient Stated Goal: to return to former level of independence   Cognition  Overall Cognitive Status: Appears within functional limits for tasks assessed/performed Arousal/Alertness: Awake/alert Orientation Level: Oriented X4 / Intact Behavior During Session: [facility] for tasks performed    Balance  High Level Balance High Level Balance Activites: Backward walking;Direction changes  End of Session PT - End of Session Activity Tolerance: Patient tolerated treatment well Patient left: in chair;with family/visitor present;with call bell/phone within reach   GP     [facility] 10/20/2012, 4:09 PM

## 2012-10-21 ENCOUNTER — Encounter (HOSPITAL_COMMUNITY): Payer: Self-pay | Admitting: Gastroenterology

## 2012-10-21 ENCOUNTER — Observation Stay (HOSPITAL_COMMUNITY): Payer: Medicare Other

## 2012-10-21 DIAGNOSIS — R112 Nausea with vomiting, unspecified: Secondary | ICD-10-CM | POA: Diagnosis not present

## 2012-10-21 LAB — CORTISOL-AM, BLOOD: Cortisol - AM: 7.2 ug/dL (ref 4.3–22.4)

## 2012-10-21 MED ORDER — ALPRAZOLAM 0.25 MG PO TABS: 0.2500 mg | ORAL_TABLET | Freq: Three times a day (TID) | ORAL | Status: DC | PRN

## 2012-10-21 MED ORDER — METOCLOPRAMIDE HCL 5 MG PO TABS
5.0000 mg | ORAL_TABLET | Freq: Three times a day (TID) | ORAL | Status: DC
  Administered 2012-10-21: 5 mg via ORAL
  Filled 2012-10-21 (×7): qty 1

## 2012-10-21 MED ORDER — KETOROLAC TROMETHAMINE 30 MG/ML IJ SOLN
30.0000 mg | Freq: Three times a day (TID) | INTRAMUSCULAR | Status: DC | PRN
  Administered 2012-10-21: 30 mg via INTRAVENOUS
  Filled 2012-10-21: qty 1

## 2012-10-21 MED ORDER — TEMAZEPAM 15 MG PO CAPS
30.0000 mg | ORAL_CAPSULE | Freq: Every day | ORAL | Status: DC
  Administered 2012-10-21: 30 mg via ORAL
  Filled 2012-10-21: qty 2

## 2012-10-21 MED ORDER — TECHNETIUM TC 99M SULFUR COLLOID
2.0000 | Freq: Once | INTRAVENOUS | Status: AC | PRN
  Administered 2012-10-21: 2 via INTRAVENOUS

## 2012-10-21 NOTE — Progress Notes (Signed)
OT Cancellation Note  Patient Details Name: Cindy [name] MRN: [id number] DOB: 11/14/1930   Cancelled Treatment:    Reason Eval/Treat Not Completed: Other (comment) (Pt's refusal to participate). Pt just back from procedure. Will check back as schedule permits.  Jeanice Dempsey A OTR/L [phone] 10/21/2012, 2:56 PM

## 2012-10-21 NOTE — Progress Notes (Signed)
Gastric emptying study still pending.  No further recommendations until GES completed.  Will follow-up tomorrow, or as outpatient (if she is discharged today).

## 2012-10-21 NOTE — Progress Notes (Signed)
PT Cancellation Note  Patient Details Name: Cindy [name] MRN: [id number] DOB: 1930-06-29   Cancelled Treatment:    Reason Eval/Treat Not Completed: Other (comment) (family defers for pt as she has been in testing all day)   Donnetta Hail 10/21/2012, 4:08 PM

## 2012-10-21 NOTE — Progress Notes (Signed)
Subjective: Patient did not sleep last night.  Xanax 0.5 milligrams was not helpful.  Patient did have gastric polyps on upper endoscopy but no other abnormality.  Gastric emptying study for today.  Will try Restoril 30 milligrams by mouth tonight for sleep.  (Still with pain where IV was.  She likely has a low-grade phlebitis there.  Objective: Weight change:   Intake/Output Summary (Last 24 hours) at 10/21/12 0829 Last data filed at 10/21/12 0612  Gross per 24 hour  Intake 3234.5 ml  Output      0 ml  Net 3234.5 ml   Filed Vitals:   10/20/12 1110 10/20/12 1335 10/20/12 2210 10/21/12 0540  BP: 123/63 129/47 148/53 138/56  Pulse:  71 68 62  Temp:  97.4 F (36.3 C) 98.3 F (36.8 C) 98.1 F (36.7 C)  TempSrc:   Oral Oral  Resp: 25 18 16 18   Height:      Weight:      SpO2: 96% 96% 96% 94%   General Appearance: Alert, cooperative, no distress, appears stated age Head: Normocephalic, without obvious abnormality, atraumatic Neck: Supple, symmetrical Lungs: Clear to auscultation bilaterally, respirations unlabored Heart: Regular rate and rhythm, S1 and S2 normal, no murmur, rub or gallop Abdomen: Soft, non-tender, bowel sounds active all four quadrants, no masses, no organomegaly Extremities: Extremities normal, atraumatic, no cyanosis or edema.  Left hand without swelling or erythema but soreness in the area of the vein where she had an IV Pulses: 2+ and symmetric all extremities Skin: Skin color, texture, turgor normal, no rashes or lesions Neuro: CNII-XII intact. Normal strength, sensation and reflexes throughout   Lab Results:  Basename 10/20/12 0430 10/19/12 0406  NA 133* 131*  K 3.5 3.5  CL 101 98  CO2 22 21  GLUCOSE 95 126*  BUN 7 9  CREATININE 0.71 0.78  CALCIUM 8.6 8.6  MG -- --  PHOS -- --   No results found for this basename: AST:2,ALT:2,ALKPHOS:2,BILITOT:2,PROT:2,ALBUMIN:2 in the last 72 hours No results found for this basename: LIPASE:2,AMYLASE:2 in the  last 72 hours No results found for this basename: WBC:2,NEUTROABS:2,HGB:2,HCT:2,MCV:2,PLT:2 in the last 72 hours No results found for this basename: CKTOTAL:3,CKMB:3,CKMBINDEX:3,TROPONINI:3 in the last 72 hours No components found with this basename: POCBNP:3 No results found for this basename: DDIMER:2 in the last 72 hours No results found for this basename: HGBA1C:2 in the last 72 hours No results found for this basename: CHOL:2,HDL:2,LDLCALC:2,TRIG:2,CHOLHDL:2,LDLDIRECT:2 in the last 72 hours  Basename 10/20/12 1305  TSH 1.071  T4TOTAL --  T3FREE --  THYROIDAB --   No results found for this basename: VITAMINB12:2,FOLATE:2,FERRITIN:2,TIBC:2,IRON:2,RETICCTPCT:2 in the last 72 hours  Studies/Results: US Soft Tissue Head/neck  10/19/2012  *RADIOLOGY REPORT*  Clinical Data: Fullness in lower throat.  History of "thyroid disease." Partial thyroidectomy per patient report.  THYROID ULTRASOUND  Technique: Ultrasound examination of the thyroid gland and adjacent soft tissues was performed.  Comparison:  None.  Findings:  Right thyroid lobe:  2.7 x 1.0 x 1.0 cm Left thyroid lobe:  Not visualized, likely surgically absent. Isthmus:  3 mm  Focal nodules:  No dominant solid nodule or mass identified within the right lobe or isthmus of the thyroid.  Lymphadenopathy:  Small nodes identified bilaterally.  No adenopathy.  IMPRESSION:  1.  Lack of visualization of the left lobe of the thyroid, presumably surgically absent, given the patient history. 2. Diminutive right lobe of the thyroid, without evidence of dominant nodule or mass.   Original Report Authenticated By:  Consuello Bossier, M.D.    Medications: Scheduled Meds:   . amLODipine  5 mg Oral Daily  . cholecalciferol  1,000 Units Oral Daily  . enoxaparin (LOVENOX) injection  40 mg Subcutaneous Q24H  . estrogens (conjugated)  0.625 mg Oral Daily  . FLUoxetine  40 mg Oral Daily  . irbesartan  75 mg Oral Daily  . levothyroxine  100 mcg Oral Daily  .  ondansetron (ZOFRAN) IV  4 mg Intravenous Q8H  . pantoprazole  40 mg Oral Daily   Continuous Infusions:   . 0.9 % NaCl with KCl 20 mEq / L 75 mL/hr at 10/20/12 1222  . DISCONTD: sodium chloride     PRN Meds:.acetaminophen, acetaminophen, ALPRAZolam, ALPRAZolam, senna-docusate, DISCONTD: butamben-tetracaine-benzocaine, DISCONTD: fentaNYL, DISCONTD: midazolam  Assessment/Plan:  Generalized Weakness/Fatigue  -Etiology unclear. The thyroid function at this time is normal. Will continue on thyroid replacement therapy, 100 micrograms daily.  B12 and RPR are normal and negative respectively. Mild hyponatremia has corrected with IV fluids. Doubt this was contributory.  -She also has a h/o depression, and with her recent mood changes, I wonder if this could be the cause. We need to r/o organic causes first. Feels better and seems to have occurred after taking Xanax. She is not hyperventilating currently  - No evidence of stroke on MRI MRA of the brain. Carotid Dopplers are nonocclusive. 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 Continue alprazolam every 6 hours as needed  Will get PT/OT to assess her for further recommendations.  Hyponatremia - resolved  Hypokalemia - resolved  Hypothyroidism - Thyroid function currently is normal. Synthroid has been resumed at 100 micrograms daily and will follow thyroid function carefully. Check cortisol level and repeat thyroid function to be sure about hormonal normalcy. Calcium levels have been normal  DVT Prophylaxis  -Lovenox.  Anxiety/agitation - will try Xanax 0.25 milligrams 3 times a day as needed Insomnia - Restoril 30 milligrams by mouth at bedtime History of depression - continue Prozac 40 milligrams daily  Hypertension - continue ACE receptor blockade  Estrogen replacement therapy - continue for now  Nausea - chronic by history - appreciate consult by Dr. Dulce Sellar.  Gastric polyps noted but otherwise normal upper endoscopy. Continue PPI  therapy. 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 Right thyroid was diminutive in size without nodules or other abnormality.  Upper endoscopy revealed gastric polyps that were biopsied.  Gastric emptying study for today which may give a reason for her chronic nausea Disposition - discharge home later today or in am   LOS: 4 days   Cindy [name] 10/21/2012, 8:29 AM

## 2012-10-22 DIAGNOSIS — R112 Nausea with vomiting, unspecified: Secondary | ICD-10-CM | POA: Diagnosis not present

## 2012-10-22 DIAGNOSIS — K3184 Gastroparesis: Secondary | ICD-10-CM | POA: Clinically undetermined

## 2012-10-22 DIAGNOSIS — D131 Benign neoplasm of stomach: Secondary | ICD-10-CM | POA: Diagnosis not present

## 2012-10-22 DIAGNOSIS — M79642 Pain in left hand: Secondary | ICD-10-CM | POA: Diagnosis not present

## 2012-10-22 HISTORY — DX: Gastroparesis: K31.84

## 2012-10-22 MED ORDER — TEMAZEPAM 30 MG PO CAPS: 30.0000 mg | ORAL_CAPSULE | Freq: Every day | ORAL | Status: DC

## 2012-10-22 MED ORDER — METOCLOPRAMIDE HCL 5 MG PO TABS: 2.5000 mg | ORAL_TABLET | Freq: Three times a day (TID) | ORAL | Status: DC

## 2012-10-22 MED ORDER — ESOMEPRAZOLE MAGNESIUM 40 MG PO CPDR: 40.0000 mg | DELAYED_RELEASE_CAPSULE | Freq: Every day | ORAL | Status: DC

## 2012-10-22 MED ORDER — METOCLOPRAMIDE HCL 5 MG PO TABS
2.5000 mg | ORAL_TABLET | Freq: Three times a day (TID) | ORAL | Status: DC
  Administered 2012-10-22 (×2): 2.5 mg via ORAL
  Filled 2012-10-22 (×4): qty 0.5

## 2012-10-22 MED ORDER — ALPRAZOLAM 0.25 MG PO TABS: 0.2500 mg | ORAL_TABLET | Freq: Three times a day (TID) | ORAL | Status: DC | PRN

## 2012-10-22 NOTE — Progress Notes (Signed)
Subjective: Nausea much improved.  At most of her breakfast this morning.  Objective: Vital signs in last 24 hours: Temp:  [98.2 F (36.8 C)-99.5 F (37.5 C)] 98.2 F (36.8 C) (11/01 0635) Pulse Rate:  [56-72] 56  (11/01 0635) Resp:  [16] 16  (11/01 0635) BP: (131-148)/(53-59) 135/55 mmHg (11/01 0635) SpO2:  [95 %-98 %] 98 % (11/01 0635) Weight change:  Last BM Date: 10/20/12  PE: GEN:  NAD  STUDIES:  Gastric emptying study (delayed two-hour gastric emptying).  Assessment:  1.  Nausea.  Likely multifactorial.  Suspect gastroparesis (with secondary GERD) could be playing a role.  Plan:  1.  OK for discharge today from GI perspective. 2.  General counseling for gastroparesis (minimizing roughage and fat/greasy foods; consuming small, frequent meals). 3.  Will arrange outpatient follow-up with [facility].  Will sign-off.  Thank you for the consult.   Freddy Jaksch 10/22/2012, 9:58 AM

## 2012-10-22 NOTE — Progress Notes (Signed)
Occupational Therapy Treatment Patient Details Name: Cindy [name] MRN: [id number] DOB: 1930-03-09 Today's Date: 10/22/2012 Time: 1478-2956 OT Time Calculation (min): 18 min  OT Assessment / Plan / Recommendation Comments on Treatment Session Pt doing well. Tolerated all tasks well.     Follow Up Recommendations  No OT follow up;Supervision/Assistance - 24 hour    Barriers to Discharge       Equipment Recommendations  None recommended by PT;None recommended by OT    Recommendations for Other Services    Frequency Min 2X/week   Plan Discharge plan remains appropriate    Precautions / Restrictions Precautions Precautions: Fall       ADL  Toilet Transfer: Performed;Supervision/safety Acupuncturist: Comfort height toilet Toileting - Architect and Hygiene: Simulated;Supervision/safety Where Assessed - Engineer, mining and Hygiene: Sit to stand from 3-in-1 or toilet Tub/Shower Transfer: Simulated;Min guard Equipment Used: Rolling walker ADL Comments: Pt requesting to use RW today to get up to the bathroom. States she feels better with it. She has a higher toilet at home and was able to sit and stand from handicap height without UE support. She has a grab bar in front of toilet if needed. Simulated step over a tub wtih grab bar. Pt normally gets down into tub to soak. Discussed not doing this initially until she is stronger and recommended a tubseat to help wtih safety in case she feels weak with shower. Family states they can obtain a seat on their own. Advised pt to have supervison/assist to get into and out of tub initially and family/pt verbalized undestanding. Pt states her L thumb is much improved and she can adduct and abduct it now. She thinks it was related to the IV placement. States now she is having some edema in R han;d where new IV placed.     OT Diagnosis:    OT Problem List:   OT Treatment Interventions:     OT Goals ADL  Goals ADL Goal: Tub/Shower Transfer - Progress: Progressing toward goals ADL Goal: Additional Goal #1 - Progress: Progressing toward goals Arm Goals Arm Goal: Additional Goal #1 - Progress: Met  Visit Information  Last OT Received On: 10/22/12 Assistance Needed: +1    Subjective Data  Subjective: I feel pretty good Patient Stated Goal: home    Prior Functioning       Cognition  Overall Cognitive Status: Appears within functional limits for tasks assessed/performed Arousal/Alertness: Awake/alert Orientation Level: Appears intact for tasks assessed Behavior During Session: [facility] for tasks performed    Mobility  Shoulder Instructions Bed Mobility Bed Mobility: Supine to Sit Supine to Sit: 6: Modified independent (Device/Increase time);HOB elevated Transfers Transfers: Sit to Stand;Stand to Sit Sit to Stand: 5: Supervision;With upper extremity assist;From bed;From toilet Stand to Sit: 5: Supervision;With upper extremity assist;To chair/3-in-1;To toilet Details for Transfer Assistance: min verbal cues for safety and hand placement       Exercises      Balance     End of Session OT - End of Session Activity Tolerance: Patient tolerated treatment well Patient left: in chair;with call bell/phone within reach  GO     Cindy [name] [phone] 10/22/2012, 11:47 AM

## 2012-10-22 NOTE — Progress Notes (Signed)
Pt,s daughter reported that  pt had tried to talk to her but pt was unable to open her mouth .she states it happened three times within short intervals  and each  episode lasted for about five seconds.Dr Valentina Lucks was notified,orders received to hold the bedtime Reglan.

## 2012-10-22 NOTE — Progress Notes (Signed)
Discharge instructions and prescriptions given.  Patient's family asked about my chart and I directed them to the instructions in the discharge instructions.  Left via wheelchair with family member.   Edison Nasuti

## 2012-10-22 NOTE — Progress Notes (Signed)
Physical Therapy Treatment Patient Details Name: Cindy [name] MRN: [id number] DOB: 29-Aug-1930 Today's Date: 10/22/2012 Time: 4098-1191 PT Time Calculation (min): 15 min  PT Assessment / Plan / Recommendation Comments on Treatment Session  Pt improved today.  Ready to d/c to home with PT follow up to incrase mobility independence without device    Follow Up Recommendations  Home health PT     Does the patient have the potential to tolerate intense rehabilitation     Barriers to Discharge        Equipment Recommendations  None recommended by PT    Recommendations for Other Services    Frequency     Plan Discharge plan remains appropriate;Frequency remains appropriate    Precautions / Restrictions Precautions Precautions: Fall   Pertinent Vitals/Pain No c/o pain    Mobility  Bed Mobility Bed Mobility: Supine to Sit Supine to Sit: 6: Modified independent (Device/Increase time);HOB elevated Transfers Transfers: Sit to Stand;Stand to Sit Sit to Stand: With upper extremity assist;From bed;6: Modified independent (Device/Increase time) Stand to Sit: With upper extremity assist;To chair/3-in-1;6: Modified independent (Device/Increase time) Details for Transfer Assistance: using RW for stability Ambulation/Gait Ambulation/Gait Assistance: 5: Supervision Ambulation Distance (Feet): 400 Feet Assistive device: Rolling walker Gait Pattern: Step-through pattern Gait velocity: improved with RW General Gait Details: pt appears comfortable with RW ,.  Able to turn head, change speeds and do sudden stops today without loss of balance Stairs: No (verbally reviewed; pt deferred practice) Wheelchair Mobility Wheelchair Mobility: No    Exercises     PT Diagnosis:    PT Problem List:   PT Treatment Interventions:     PT Goals Acute Rehab PT Goals Time For Goal Achievement: 11/02/12 Potential to Achieve Goals: Good Pt will go Supine/Side to Sit: Independently Pt will go Sit to  Stand: Independently PT Goal: Sit to Stand - Progress: Progressing toward goal Pt will go Stand to Sit: Independently PT Goal: Stand to Sit - Progress: Progressing toward goal Pt will Ambulate: >150 feet;with least restrictive assistive device;with modified independence PT Goal: Ambulate - Progress: Progressing toward goal PT Goal: Up/Down Stairs - Progress: Progressing toward goal  Visit Information  Last PT Received On: 10/22/12 Assistance Needed: +1    Subjective Data  Subjective: I'm going home today Patient Stated Goal: to go home   Cognition  Overall Cognitive Status: Appears within functional limits for tasks assessed/performed Arousal/Alertness: Awake/alert Orientation Level: Appears intact for tasks assessed Behavior During Session: [facility] for tasks performed    Balance     End of Session PT - End of Session Activity Tolerance: Patient tolerated treatment well Patient left: with family/visitor present   GP     Terea K. Weston, [place] [phone] 10/22/2012, 2:57 PM

## 2012-10-22 NOTE — Discharge Summary (Signed)
Physician Discharge Summary  Cindy [name]  FAO:[id number]  DOB: 08-28-1930   Admit date: 10/17/2012 Discharge date: 10/22/2012  Discharge Diagnoses:  Principal Problem:  *Weakness Active Problems:  Hyponatremia  Hypokalemia  Hypothyroidism  Fatigue  Anxiety  Hyperventilation syndrome  Headache  Neck pain on right side  Hypertension  GERD (gastroesophageal reflux disease)  Hyperlipidemia  Insomnia  Hearing loss sensory, bilateral  Aneurysm of middle cerebral artery  Gastroparesis  Hand pain, left   Discharge Condition: Improved with nausea improved.  May not be tolerating metoclopramide and will be trying a 10 milligram dose prior to meals instead of 5 milligrams.  Seemed to have some dystonia with change in speech after taking the metoclopramide 5 milligrams prior to meals.  Overall much improved with hyperventilation resolved and anxiety much improved.  Nausea and discomfort from gastroparesis may have caused the respiratory alkalosis secondary to anxiety and distress  Hospital Course: 78 year old female who was seen in my office 2 days prior to admission to [facility] but had refused further workup will at that time.  She had been complaining of about a month of generalized weakness and fatigue and nausea.  She has been very emotional and crying frequently.  She was found to be hyponatremic and hypokalemic mildly with a slightly elevated lactic acid level on admission.  She was centered on the possibility that she might have a endocrinologic issue but that has not been clinically represented.  She has responded well to anxiolytics, sleep medication, and may have mild to moderate gastroparesis contributing to her symptoms.  We are trying low-dose metoclopramide initially but she did not tolerate 5 milligrams prior to meals with some dystonia causing dysarthria.  Dr. Dulce Sellar may suggest a mycin medication. Hypokalemia and hyponatremia resolved with rehydration and  replacement and resolution of hyperventilation. Again, her highly emotional anxious state on admission may have been secondary to hyperventilation secondary to discomfort from nausea  Filed Vitals:   10/21/12 0540 10/21/12 1500 10/21/12 2116 10/22/12 0635  BP: 138/56 148/53 131/59 135/55  Pulse: 62 63 72 56  Temp: 98.1 F (36.7 C) 98.3 F (36.8 C) 99.5 F (37.5 C) 98.2 F (36.8 C)  TempSrc: Oral  Oral Oral  Resp: 18 16 16 16   Height:      Weight:      SpO2: 94% 95% 98% 98%   General Appearance: Alert, cooperative, no distress, appears stated age  Head: Normocephalic, without obvious abnormality, atraumatic  Neck: Supple, symmetrical  Lungs: Clear to auscultation bilaterally, respirations unlabored  Heart: Regular rate and rhythm, S1 and S2 normal, no murmur, rub or gallop  Abdomen: Soft, non-tender, bowel sounds active all four quadrants, no masses, no organomegaly  Extremities: Extremities normal, atraumatic, no cyanosis or edema. Left hand without swelling or erythema and without significant pain today  Pulses: 2+ and symmetric all extremities  Skin: Skin color, texture, turgor normal, no rashes or lesions  Neuro: CNII-XII intact. Normal strength, sensation and reflexes throughout   Consults: Treatment Team:  Marden Noble, MD  Dr. [facility]  Disposition: 01-Home or Self Care  Discharge Orders    Future Orders Please Complete By Expires   Diet - low sodium heart healthy      Increase activity slowly      Discharge instructions      Comments:   Call [phone] on Monday, November 4 for appointment next week with Dr. Kevan Ny.  Discontinue the metoclopramide if unable to speak normally when taking  it   Call MD for:  temperature >100.4      Call MD for:  persistant nausea and vomiting      Call MD for:  severe uncontrolled pain      Call MD for:  persistant dizziness or light-headedness      Call MD for:  difficulty breathing, headache or visual  disturbances      Call MD for:  extreme fatigue          Medication List     As of 10/22/2012  7:34 AM    STOP taking these medications         ALIGN 4 MG Caps      zolpidem 10 MG tablet   Commonly known as: AMBIEN      TAKE these medications         ALPRAZolam 0.25 MG tablet   Commonly known as: XANAX   Take 1 tablet (0.25 mg total) by mouth 3 (three) times daily as needed for anxiety.      amLODipine 5 MG tablet   Commonly known as: NORVASC   Take 5 mg by mouth daily.      cholecalciferol 1000 UNITS tablet   Commonly known as: VITAMIN D   Take 1,000 Units by mouth daily.      esomeprazole 40 MG capsule   Commonly known as: NEXIUM   Take 1 capsule (40 mg total) by mouth daily before breakfast.      estrogens (conjugated) 0.625 MG tablet   Commonly known as: PREMARIN   Take 0.625 mg by mouth daily.      FLUoxetine 40 MG capsule   Commonly known as: PROZAC   Take 40 mg by mouth daily.      levothyroxine 100 MCG tablet   Commonly known as: SYNTHROID, LEVOTHROID   Take 100 mcg by mouth daily.      metoCLOPramide 5 MG tablet   Commonly known as: REGLAN   Take 0.5 tablets (2.5 mg total) by mouth 3 (three) times daily before meals.      telmisartan 80 MG tablet   Commonly known as: MICARDIS   Take 80 mg by mouth daily.      temazepam 30 MG capsule   Commonly known as: RESTORIL   Take 1 capsule (30 mg total) by mouth at bedtime.         Things to follow up in the outpatient setting: Persistent nausea and/or vomiting and/or anxiety and insomnia  Time coordinating discharge: 40 minutes  The results of significant diagnostics from this hospitalization (including imaging, microbiology, ancillary and laboratory) are listed below for reference.    Significant Diagnostic Studies: Dg Chest 2 View  10/17/2012  *RADIOLOGY REPORT*  Clinical Data: 78 year old female with chest pain.  CHEST - 2 VIEW  Comparison: None  Findings: Upper limits normal heart size noted.  There is no evidence of focal airspace disease, pulmonary edema, suspicious pulmonary nodule/mass, pleural effusion, or pneumothorax. No acute bony abnormalities are identified.  IMPRESSION: No evidence of acute cardiopulmonary disease.   Original Report Authenticated By: Rosendo Gros, M.D.    Ct Head Wo Contrast  10/17/2012  *RADIOLOGY REPORT*  Clinical Data: Weakness for several months.  Nausea.  History of hypertension and depression.  CT HEAD WITHOUT CONTRAST  Technique:  Contiguous axial images were obtained from the base of the skull through the vertex without contrast.  Comparison: Head CT 01/05/2012.  Findings: There is no evidence of acute intracranial hemorrhage, mass lesion, brain  edema or extra-axial fluid collection.  The ventricles and subarachnoid spaces are appropriately sized for age. There is no CT evidence of acute cortical infarction.  Chronic small vessel ischemic changes in the periventricular white matter and basal ganglia are stable.  The visualized paranasal sinuses are clear. The calvarium is intact.  IMPRESSION: Stable chronic small vessel ischemic changes.  No acute intracranial findings demonstrated.   Original Report Authenticated By: Gerrianne Scale, M.D.    Mr [facility] Wo Contrast  10/18/2012  *RADIOLOGY REPORT*  Clinical Data: 78 year old female with mental status change and headache.  History of right MCA aneurysm.  MRI HEAD WITHOUT AND WITH CONTRAST  Technique: Multiplanar, multiecho pulse sequences of the brain and surrounding structures were obtained according to standard protocol without and with intravenous contrast.  Contrast: 15mL MULTIHANCE GADOBENATE DIMEGLUMINE 529 MG/ML IV SOLN  Comparison: Brain MRI and MRA 06/14/2011.  Cerebral angiogram 06/20/2011.  Findings:  No restricted diffusion to suggest acute infarction.  No midline shift, mass effect, evidence of mass lesion, ventriculomegaly, extra-axial collection or acute intracranial hemorrhage.   Cervicomedullary junction and pituitary are within normal limits.  Major intracranial vascular flow voids are preserved.  Confluent cerebral white matter T2 and FLAIR hyperintensity extending from the corona radiata to the deep white matter capsules is not significantly changed.  Patchy pontine T2 and FLAIR hyperintensity also is stable.  T2 heterogeneity in the deep gray matter nuclei is stable. No abnormal enhancement identified.  No new signal abnormality.  Negative visualized cervical spine. Visualized bone marrow signal is within normal limits.  Stable orbits.  Stable paranasal sinuses and mastoids.  Negative scalp soft tissues.  IMPRESSION: 1. No acute intracranial abnormality.  Stable MRI appearance of the brain. 2.  See MRA findings below.  MRA HEAD WITHOUT CONTRAST  Technique: Angiographic images of the Circle of Willis were obtained using MRA technique without  intravenous contrast.  Findings:  Antegrade flow in the posterior circulation.  Stable dominant distal left vertebral artery.  Normal right PICA.  Stable and normal vertebrobasilar junction.  Stable mild mid basilar artery irregularity without significant stenosis.  SCA and PCA origins are stable within normal limits.  Stable bilateral PCA branches, normal except for mild irregularity at the right bifurcation.  Antegrade flow in both ICA siphons.  Small 2-3 mm laterally directed saccular lesion from the right cavernous ICA is stable. Ophthalmic artery origins remain normal.  No significant ICA stenosis.  Carotid termini, MCA and ACA origins remain normal. Posterior communicating arteries are diminutive or absent. Anterior communicating artery and visualized ACA branches remain within normal limits.  Visualized left MCA branches remain within normal limits.  Right MCA M1 segment is patent with mild to moderate irregularity as before.  Superiorly directed right MCA M1 segment aneurysm re- identified arising just beyond the right lenticulostriate and  right anterior temporal artery origins.  This has not significantly changed in size measuring 4 x 5 x 6 mm as before.  The more distal right M1 segment remains patent.  Right MCA bifurcation is stable with mild irregularity.  There is a second smaller 1-2 mm aneurysm at the right MCA bifurcation which was less evident on the prior. Visualized right MCA branches are stable.  IMPRESSION: 1.  Stable right MCA M1 segment aneurysm directed superiorly measuring 4 x 5 x 6 mm. 2.  A second smaller right MCA bifurcation aneurysm measuring 2 x 3 mm is better demonstrated on the study, but probably not significantly changed since 2012. 3.  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 4.  No new intracranial MRA abnormality.   Original Report Authenticated By: Harley Hallmark, M.D.    Mr Laqueta Jean Wo Contrast  10/18/2012  *RADIOLOGY REPORT*  Clinical Data: 78 year old female with mental status change and headache.  History of right MCA aneurysm.  MRI HEAD WITHOUT AND WITH CONTRAST  Technique: Multiplanar, multiecho pulse sequences of the brain and surrounding structures were obtained according to standard protocol without and with intravenous contrast.  Contrast: 15mL MULTIHANCE GADOBENATE DIMEGLUMINE 529 MG/ML IV SOLN  Comparison: Brain MRI and MRA 06/14/2011.  Cerebral angiogram 06/20/2011.  Findings:  No restricted diffusion to suggest acute infarction.  No midline shift, mass effect, evidence of mass lesion, ventriculomegaly, extra-axial collection or acute intracranial hemorrhage.  Cervicomedullary junction and pituitary are within normal limits.  Major intracranial vascular flow voids are preserved.  Confluent cerebral white matter T2 and FLAIR hyperintensity extending from the corona radiata to the deep white matter capsules is not significantly changed.  Patchy pontine T2 and FLAIR hyperintensity also is stable.  T2 heterogeneity in the deep gray matter nuclei  is stable. No abnormal enhancement identified.  No new signal abnormality.  Negative visualized cervical spine. Visualized bone marrow signal is within normal limits.  Stable orbits.  Stable paranasal sinuses and mastoids.  Negative scalp soft tissues.  IMPRESSION: 1. No acute intracranial abnormality.  Stable MRI appearance of the brain. 2.  See MRA findings below.  MRA HEAD WITHOUT CONTRAST  Technique: Angiographic images of the Circle of Willis were obtained using MRA technique without  intravenous contrast.  Findings:  Antegrade flow in the posterior circulation.  Stable dominant distal left vertebral artery.  Normal right PICA.  Stable and normal vertebrobasilar junction.  Stable mild mid basilar artery irregularity without significant stenosis.  SCA and PCA origins are stable within normal limits.  Stable bilateral PCA branches, normal except for mild irregularity at the right bifurcation.  Antegrade flow in both ICA siphons.  Small 2-3 mm laterally directed saccular lesion from the right cavernous ICA is stable. Ophthalmic artery origins remain normal.  No significant ICA stenosis.  Carotid termini, MCA and ACA origins remain normal. Posterior communicating arteries are diminutive or absent. Anterior communicating artery and visualized ACA branches remain within normal limits.  Visualized left MCA branches remain within normal limits.  Right MCA M1 segment is patent with mild to moderate irregularity as before.  Superiorly directed right MCA M1 segment aneurysm re- identified arising just beyond the right lenticulostriate and right anterior temporal artery origins.  This has not significantly changed in size measuring 4 x 5 x 6 mm as before.  The more distal right M1 segment remains patent.  Right MCA bifurcation is stable with mild irregularity.  There is a second smaller 1-2 mm aneurysm at the right MCA bifurcation which was less evident on the prior. Visualized right MCA branches are stable.  IMPRESSION:  1.  Stable right MCA M1 segment aneurysm directed superiorly measuring 4 x 5 x 6 mm. 2.  A second smaller right MCA bifurcation aneurysm measuring 2 x 3 mm is better demonstrated on the study, but probably not significantly changed since 2012. 3.  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 4.  No new intracranial MRA abnormality.   Original Report Authenticated By: Harley Hallmark, M.D.    Nm Gastric Emptying  10/21/2012  *RADIOLOGY REPORT*  Clinical  Data:  History of nausea and vomiting.  NUCLEAR MEDICINE GASTRIC EMPTYING SCAN  Technique:  After oral ingestion of radiolabeled meal, sequential abdominal images were obtained for 120 minutes.  Residual percentage of activity remaining within the stomach was calculated at 60 and 120 minutes.  Radiopharmaceutical:  2.0 mCi Tc-54m sulfur colloid.  Comparison:  None.  Findings: At 60 minutes 92% of the ingested activity remained within the stomach.  At 120 minutes 39% of the ingested activity remained within the stomach.  This is abnormal.  Normal range is less than 30% of ingested activity remaining within the stomach at 120 minutes.  IMPRESSION:  At 120 minutes 39% of the ingested activity remained within the stomach.  This is abnormal.  Normal range is less than 30% of ingested activity remaining within the stomach at 120 minutes.   Original Report Authenticated By: Onalee Hua Call    US Soft Tissue Head/neck  10/19/2012  *RADIOLOGY REPORT*  Clinical Data: Fullness in lower throat.  History of "thyroid disease." Partial thyroidectomy per patient report.  THYROID ULTRASOUND  Technique: Ultrasound examination of the thyroid gland and adjacent soft tissues was performed.  Comparison:  None.  Findings:  Right thyroid lobe:  2.7 x 1.0 x 1.0 cm Left thyroid lobe:  Not visualized, likely surgically absent. Isthmus:  3 mm  Focal nodules:  No dominant solid nodule or mass identified within the right lobe or isthmus  of the thyroid.  Lymphadenopathy:  Small nodes identified bilaterally.  No adenopathy.  IMPRESSION:  1.  Lack of visualization of the left lobe of the thyroid, presumably surgically absent, given the patient history. 2. Diminutive right lobe of the thyroid, without evidence of dominant nodule or mass.   Original Report Authenticated By: Consuello Bossier, M.D.     Microbiology: No results found for this or any previous visit (from the past 240 hour(s)).   Labs: Results for orders placed during the hospital encounter of 10/17/12  CBC WITH DIFFERENTIAL      Component Value Range   WBC 7.7  4.0 - 10.5 K/uL   RBC 4.13  3.87 - 5.11 MIL/uL   Hemoglobin 12.9  12.0 - 15.0 g/dL   HCT 16.1  09.6 - 04.5 %   MCV 87.9  78.0 - 100.0 fL   MCH 31.2  26.0 - 34.0 pg   MCHC 35.5  30.0 - 36.0 g/dL   RDW 40.9  81.1 - 91.4 %   Platelets 291  150 - 400 K/uL   Neutrophils Relative 52  43 - 77 %   Neutro Abs 3.9  1.7 - 7.7 K/uL   Lymphocytes Relative 38  12 - 46 %   Lymphs Abs 2.9  0.7 - 4.0 K/uL   Monocytes Relative 9  3 - 12 %   Monocytes Absolute 0.7  0.1 - 1.0 K/uL   Eosinophils Relative 1  0 - 5 %   Eosinophils Absolute 0.1  0.0 - 0.7 K/uL   Basophils Relative 0  0 - 1 %   Basophils Absolute 0.0  0.0 - 0.1 K/uL  COMPREHENSIVE METABOLIC PANEL      Component Value Range   Sodium 129 (*) 135 - 145 mEq/L   Potassium 3.0 (*) 3.5 - 5.1 mEq/L   Chloride 95 (*) 96 - 112 mEq/L   CO2 19  19 - 32 mEq/L   Glucose, Bld 97  70 - 99 mg/dL   BUN 13  6 - 23 mg/dL   Creatinine, Ser 7.82  0.50 - 1.10 mg/dL   Calcium 9.1  8.4 - 16.1 mg/dL   Total Protein 6.8  6.0 - 8.3 g/dL   Albumin 3.5  3.5 - 5.2 g/dL   AST 17  0 - 37 U/L   ALT 15  0 - 35 U/L   Alkaline Phosphatase 52  39 - 117 U/L   Total Bilirubin 0.3  0.3 - 1.2 mg/dL   GFR calc non Af Amer 80 (*) >90 mL/min   GFR calc Af Amer >90  >90 mL/min  LIPASE, BLOOD      Component Value Range   Lipase 27  11 - 59 U/L  TROPONIN I      Component Value Range    Troponin I <0.30  <0.30 ng/mL  URINALYSIS, ROUTINE W REFLEX MICROSCOPIC      Component Value Range   Color, Urine YELLOW  YELLOW   APPearance CLEAR  CLEAR   Specific Gravity, Urine 1.009  1.005 - 1.030   pH 7.5  5.0 - 8.0   Glucose, UA NEGATIVE  NEGATIVE mg/dL   Hgb urine dipstick NEGATIVE  NEGATIVE   Bilirubin Urine NEGATIVE  NEGATIVE   Ketones, ur NEGATIVE  NEGATIVE mg/dL   Protein, ur NEGATIVE  NEGATIVE mg/dL   Urobilinogen, UA 0.2  0.0 - 1.0 mg/dL   Nitrite NEGATIVE  NEGATIVE   Leukocytes, UA NEGATIVE  NEGATIVE  LACTIC ACID, PLASMA      Component Value Range   Lactic Acid, Venous 3.1 (*) 0.5 - 2.2 mmol/L  BLOOD GAS, VENOUS      Component Value Range   FIO2 0.21     pH, Ven 7.604 (*) 7.250 - 7.300   pCO2, Ven 20.5 (*) 45.0 - 50.0 mmHg   pO2, Ven 43.1  30.0 - 45.0 mmHg   Bicarbonate 20.5  20.0 - 24.0 mEq/L   TCO2 17.4  0 - 100 mmol/L   Acid-Base Excess 0.9  0.0 - 2.0 mmol/L   O2 Saturation 87.6     Patient temperature 98.6     Collection site COLLECTED BY LABORATORY     Drawn by COLLECTED BY LABORATORY     Sample type VEIN    POTASSIUM      Component Value Range   Potassium 3.0 (*) 3.5 - 5.1 mEq/L  T4, FREE      Component Value Range   Free T4 0.96  0.80 - 1.80 ng/dL  TSH      Component Value Range   TSH 0.362  0.350 - 4.500 uIU/mL  VITAMIN B12      Component Value Range   Vitamin B-12 708  211 - 911 pg/mL  RPR      Component Value Range   RPR NON REACTIVE  NON REACTIVE  COMPREHENSIVE METABOLIC PANEL      Component Value Range   Sodium 132 (*) 135 - 145 mEq/L   Potassium 3.5  3.5 - 5.1 mEq/L   Chloride 99  96 - 112 mEq/L   CO2 20  19 - 32 mEq/L   Glucose, Bld 97  70 - 99 mg/dL   BUN 9  6 - 23 mg/dL   Creatinine, Ser 0.96  0.50 - 1.10 mg/dL   Calcium 9.0  8.4 - 04.5 mg/dL   Total Protein 6.8  6.0 - 8.3 g/dL   Albumin 3.5  3.5 - 5.2 g/dL   AST 19  0 - 37 U/L   ALT 15  0 - 35 U/L   Alkaline Phosphatase  54  39 - 117 U/L   Total Bilirubin 0.7  0.3 - 1.2  mg/dL   GFR calc non Af Amer 79 (*) >90 mL/min   GFR calc Af Amer >90  >90 mL/min  CBC      Component Value Range   WBC 8.2  4.0 - 10.5 K/uL   RBC 4.24  3.87 - 5.11 MIL/uL   Hemoglobin 12.9  12.0 - 15.0 g/dL   HCT 16.1  09.6 - 04.5 %   MCV 87.3  78.0 - 100.0 fL   MCH 30.4  26.0 - 34.0 pg   MCHC 34.9  30.0 - 36.0 g/dL   RDW 40.9  81.1 - 91.4 %   Platelets 306  150 - 400 K/uL  T3, FREE      Component Value Range   T3, Free 2.3  2.3 - 4.2 pg/mL  BLOOD GAS, ARTERIAL      Component Value Range   FIO2 0.21     Delivery systems ROOM AIR     pH, Arterial 7.475 (*) 7.350 - 7.450   pCO2 arterial 29.3 (*) 35.0 - 45.0 mmHg   pO2, Arterial 101.0 (*) 80.0 - 100.0 mmHg   Bicarbonate 21.3  20.0 - 24.0 mEq/L   TCO2 18.6  0 - 100 mmol/L   Acid-base deficit 0.8  0.0 - 2.0 mmol/L   O2 Saturation 98.0     Patient temperature 98.6     Collection site RIGHT RADIAL     Drawn by [phone]     Sample type ARTERIAL DRAW     Allens test (pass/fail) PASS  PASS  SEDIMENTATION RATE      Component Value Range   Sed Rate 10  0 - 22 mm/hr  BASIC METABOLIC PANEL      Component Value Range   Sodium 131 (*) 135 - 145 mEq/L   Potassium 3.5  3.5 - 5.1 mEq/L   Chloride 98  96 - 112 mEq/L   CO2 21  19 - 32 mEq/L   Glucose, Bld 126 (*) 70 - 99 mg/dL   BUN 9  6 - 23 mg/dL   Creatinine, Ser 2.13  0.50 - 1.10 mg/dL   Calcium 8.6  8.4 - 08.6 mg/dL   GFR calc non Af Amer 77 (*) >90 mL/min   GFR calc Af Amer 89 (*) >90 mL/min  CORTISOL-AM, BLOOD      Component Value Range   Cortisol - AM 17.5  4.3 - 22.4 ug/dL  BASIC METABOLIC PANEL      Component Value Range   Sodium 133 (*) 135 - 145 mEq/L   Potassium 3.5  3.5 - 5.1 mEq/L   Chloride 101  96 - 112 mEq/L   CO2 22  19 - 32 mEq/L   Glucose, Bld 95  70 - 99 mg/dL   BUN 7  6 - 23 mg/dL   Creatinine, Ser 5.78  0.50 - 1.10 mg/dL   Calcium 8.6  8.4 - 46.9 mg/dL   GFR calc non Af Amer 79 (*) >90 mL/min   GFR calc Af Amer >90  >90 mL/min  URIC ACID       Component Value Range   Uric Acid, Serum 3.1  2.4 - 7.0 mg/dL  TSH      Component Value Range   TSH 1.071  0.350 - 4.500 uIU/mL  T4, FREE      Component Value Range   Free T4 1.01  0.80 - 1.80 ng/dL  CORTISOL-AM,  BLOOD      Component Value Range   Cortisol - AM 7.2  4.3 - 22.4 ug/dL     Signed: Detta Mellin NEVILL 10/22/2012, 7:34 AM

## 2012-10-27 DIAGNOSIS — E039 Hypothyroidism, unspecified: Secondary | ICD-10-CM | POA: Diagnosis not present

## 2012-10-27 DIAGNOSIS — K219 Gastro-esophageal reflux disease without esophagitis: Secondary | ICD-10-CM | POA: Diagnosis not present

## 2012-10-27 DIAGNOSIS — K3184 Gastroparesis: Secondary | ICD-10-CM | POA: Diagnosis not present

## 2012-10-27 DIAGNOSIS — I10 Essential (primary) hypertension: Secondary | ICD-10-CM | POA: Diagnosis not present

## 2012-10-27 DIAGNOSIS — M79609 Pain in unspecified limb: Secondary | ICD-10-CM | POA: Diagnosis not present

## 2012-10-27 DIAGNOSIS — G4700 Insomnia, unspecified: Secondary | ICD-10-CM | POA: Diagnosis not present

## 2012-10-27 DIAGNOSIS — N390 Urinary tract infection, site not specified: Secondary | ICD-10-CM | POA: Diagnosis not present

## 2012-10-27 DIAGNOSIS — E876 Hypokalemia: Secondary | ICD-10-CM | POA: Diagnosis not present

## 2012-10-27 DIAGNOSIS — E782 Mixed hyperlipidemia: Secondary | ICD-10-CM | POA: Diagnosis not present

## 2012-11-08 DIAGNOSIS — R112 Nausea with vomiting, unspecified: Secondary | ICD-10-CM | POA: Diagnosis not present

## 2012-11-20 DIAGNOSIS — M79609 Pain in unspecified limb: Secondary | ICD-10-CM | POA: Diagnosis not present

## 2012-11-20 DIAGNOSIS — K3184 Gastroparesis: Secondary | ICD-10-CM | POA: Diagnosis not present

## 2012-11-20 DIAGNOSIS — N390 Urinary tract infection, site not specified: Secondary | ICD-10-CM | POA: Diagnosis not present

## 2012-11-20 DIAGNOSIS — E039 Hypothyroidism, unspecified: Secondary | ICD-10-CM | POA: Diagnosis not present

## 2012-11-20 DIAGNOSIS — I10 Essential (primary) hypertension: Secondary | ICD-10-CM | POA: Diagnosis not present

## 2012-11-20 DIAGNOSIS — G4700 Insomnia, unspecified: Secondary | ICD-10-CM | POA: Diagnosis not present

## 2012-11-23 MED ORDER — GADOBENATE DIMEGLUMINE 529 MG/ML IV SOLN
15.0000 mL | Freq: Once | INTRAVENOUS | Status: AC | PRN
  Administered 2012-11-23: 15 mL via INTRAVENOUS

## 2012-12-29 ENCOUNTER — Other Ambulatory Visit (HOSPITAL_COMMUNITY): Payer: Self-pay | Admitting: Neurosurgery

## 2012-12-29 DIAGNOSIS — I671 Cerebral aneurysm, nonruptured: Secondary | ICD-10-CM

## 2013-01-10 ENCOUNTER — Ambulatory Visit (HOSPITAL_COMMUNITY): Payer: Medicare Other

## 2013-01-17 DIAGNOSIS — K3184 Gastroparesis: Secondary | ICD-10-CM | POA: Diagnosis not present

## 2013-01-17 DIAGNOSIS — F411 Generalized anxiety disorder: Secondary | ICD-10-CM | POA: Diagnosis not present

## 2013-01-17 DIAGNOSIS — G4700 Insomnia, unspecified: Secondary | ICD-10-CM | POA: Diagnosis not present

## 2013-01-17 DIAGNOSIS — I10 Essential (primary) hypertension: Secondary | ICD-10-CM | POA: Diagnosis not present

## 2013-01-17 DIAGNOSIS — Z79899 Other long term (current) drug therapy: Secondary | ICD-10-CM | POA: Diagnosis not present

## 2013-01-17 DIAGNOSIS — E538 Deficiency of other specified B group vitamins: Secondary | ICD-10-CM | POA: Diagnosis not present

## 2013-01-17 DIAGNOSIS — E7800 Pure hypercholesterolemia, unspecified: Secondary | ICD-10-CM | POA: Diagnosis not present

## 2013-01-17 DIAGNOSIS — E039 Hypothyroidism, unspecified: Secondary | ICD-10-CM | POA: Diagnosis not present

## 2013-01-18 DIAGNOSIS — K5900 Constipation, unspecified: Secondary | ICD-10-CM | POA: Diagnosis not present

## 2013-01-18 DIAGNOSIS — R112 Nausea with vomiting, unspecified: Secondary | ICD-10-CM | POA: Diagnosis not present

## 2013-01-24 DIAGNOSIS — J40 Bronchitis, not specified as acute or chronic: Secondary | ICD-10-CM | POA: Diagnosis not present

## 2013-02-23 DIAGNOSIS — I671 Cerebral aneurysm, nonruptured: Secondary | ICD-10-CM | POA: Diagnosis not present

## 2013-03-17 ENCOUNTER — Inpatient Hospital Stay (HOSPITAL_COMMUNITY)
Admission: EM | Admit: 2013-03-17 | Discharge: 2013-03-18 | DRG: 392 | Disposition: A | Discharge status: Home / Self Care | Payer: Medicare Other | Attending: Internal Medicine | Admitting: Internal Medicine

## 2013-03-17 ENCOUNTER — Encounter (HOSPITAL_COMMUNITY): Payer: Self-pay | Admitting: Radiology

## 2013-03-17 ENCOUNTER — Emergency Department (HOSPITAL_COMMUNITY): Payer: Medicare Other

## 2013-03-17 DIAGNOSIS — A09 Infectious gastroenteritis and colitis, unspecified: Principal | ICD-10-CM | POA: Diagnosis present

## 2013-03-17 DIAGNOSIS — K219 Gastro-esophageal reflux disease without esophagitis: Secondary | ICD-10-CM | POA: Diagnosis not present

## 2013-03-17 DIAGNOSIS — K5289 Other specified noninfective gastroenteritis and colitis: Secondary | ICD-10-CM | POA: Diagnosis not present

## 2013-03-17 DIAGNOSIS — K529 Noninfective gastroenteritis and colitis, unspecified: Secondary | ICD-10-CM | POA: Diagnosis present

## 2013-03-17 DIAGNOSIS — E871 Hypo-osmolality and hyponatremia: Secondary | ICD-10-CM | POA: Diagnosis not present

## 2013-03-17 DIAGNOSIS — Z79899 Other long term (current) drug therapy: Secondary | ICD-10-CM

## 2013-03-17 DIAGNOSIS — F3289 Other specified depressive episodes: Secondary | ICD-10-CM | POA: Diagnosis present

## 2013-03-17 DIAGNOSIS — K5732 Diverticulitis of large intestine without perforation or abscess without bleeding: Secondary | ICD-10-CM | POA: Diagnosis present

## 2013-03-17 DIAGNOSIS — I10 Essential (primary) hypertension: Secondary | ICD-10-CM | POA: Diagnosis present

## 2013-03-17 DIAGNOSIS — E785 Hyperlipidemia, unspecified: Secondary | ICD-10-CM | POA: Diagnosis present

## 2013-03-17 DIAGNOSIS — E875 Hyperkalemia: Secondary | ICD-10-CM | POA: Diagnosis not present

## 2013-03-17 DIAGNOSIS — F329 Major depressive disorder, single episode, unspecified: Secondary | ICD-10-CM | POA: Diagnosis present

## 2013-03-17 DIAGNOSIS — E876 Hypokalemia: Secondary | ICD-10-CM | POA: Diagnosis present

## 2013-03-17 DIAGNOSIS — E039 Hypothyroidism, unspecified: Secondary | ICD-10-CM | POA: Diagnosis present

## 2013-03-17 HISTORY — DX: Noninfective gastroenteritis and colitis, unspecified: K52.9

## 2013-03-17 LAB — URINALYSIS, ROUTINE W REFLEX MICROSCOPIC
Nitrite: NEGATIVE
Specific Gravity, Urine: 1.012 (ref 1.005–1.030)
Urobilinogen, UA: 0.2 mg/dL (ref 0.0–1.0)
pH: 7 (ref 5.0–8.0)

## 2013-03-17 LAB — CBC WITH DIFFERENTIAL/PLATELET
Basophils Absolute: 0 10*3/uL (ref 0.0–0.1)
Basophils Relative: 0 % (ref 0–1)
HCT: 39.8 % (ref 36.0–46.0)
Lymphocytes Relative: 26 % (ref 12–46)
Neutro Abs: 5.6 10*3/uL (ref 1.7–7.7)
Neutrophils Relative %: 66 % (ref 43–77)
Platelets: 314 10*3/uL (ref 150–400)
RDW: 12.5 % (ref 11.5–15.5)
WBC: 8.5 10*3/uL (ref 4.0–10.5)

## 2013-03-17 LAB — COMPREHENSIVE METABOLIC PANEL
ALT: 25 U/L (ref 0–35)
BUN: 12 mg/dL (ref 6–23)
CO2: 25 mEq/L (ref 19–32)
Calcium: 9.6 mg/dL (ref 8.4–10.5)
Creatinine, Ser: 0.65 mg/dL (ref 0.50–1.10)
GFR calc Af Amer: >90 mL/min (ref >90)
GFR calc non Af Amer: 81 mL/min — ABNORMAL LOW (ref >90)
Glucose, Bld: 127 mg/dL — ABNORMAL HIGH (ref 70–99)

## 2013-03-17 LAB — URINE MICROSCOPIC-ADD ON

## 2013-03-17 LAB — LACTIC ACID, PLASMA: Lactic Acid, Venous: 1.9 mmol/L (ref 0.5–2.2)

## 2013-03-17 MED ORDER — LEVOTHYROXINE SODIUM 100 MCG PO TABS
100.0000 ug | ORAL_TABLET | Freq: Every morning | ORAL | Status: DC
  Administered 2013-03-18: 100 ug via ORAL
  Filled 2013-03-17: qty 1

## 2013-03-17 MED ORDER — IOHEXOL 300 MG/ML  SOLN
100.0000 mL | Freq: Once | INTRAMUSCULAR | Status: AC | PRN
  Administered 2013-03-17: 100 mL via INTRAVENOUS

## 2013-03-17 MED ORDER — METRONIDAZOLE IN NACL 5-0.79 MG/ML-% IV SOLN: 500.0000 mg | Freq: Once | INTRAVENOUS | Status: DC

## 2013-03-17 MED ORDER — CIPROFLOXACIN IN D5W 400 MG/200ML IV SOLN
400.0000 mg | Freq: Once | INTRAVENOUS | Status: AC
  Administered 2013-03-17: 400 mg via INTRAVENOUS
  Filled 2013-03-17: qty 200

## 2013-03-17 MED ORDER — FLUOXETINE HCL 20 MG PO CAPS
40.0000 mg | ORAL_CAPSULE | Freq: Every morning | ORAL | Status: DC
  Administered 2013-03-18: 40 mg via ORAL
  Filled 2013-03-17: qty 2

## 2013-03-17 MED ORDER — IRBESARTAN 75 MG PO TABS
75.0000 mg | ORAL_TABLET | Freq: Every day | ORAL | Status: DC
  Administered 2013-03-18: 75 mg via ORAL
  Filled 2013-03-17: qty 1

## 2013-03-17 MED ORDER — CIPROFLOXACIN IN D5W 400 MG/200ML IV SOLN
400.0000 mg | Freq: Two times a day (BID) | INTRAVENOUS | Status: DC
  Administered 2013-03-18: 400 mg via INTRAVENOUS
  Filled 2013-03-17 (×2): qty 200

## 2013-03-17 MED ORDER — METRONIDAZOLE IN NACL 5-0.79 MG/ML-% IV SOLN
500.0000 mg | Freq: Three times a day (TID) | INTRAVENOUS | Status: DC
  Administered 2013-03-17 – 2013-03-18 (×3): 500 mg via INTRAVENOUS
  Filled 2013-03-17 (×4): qty 100

## 2013-03-17 MED ORDER — ACETAMINOPHEN 650 MG RE SUPP: 650.0000 mg | Freq: Four times a day (QID) | RECTAL | Status: DC | PRN

## 2013-03-17 MED ORDER — IOHEXOL 300 MG/ML  SOLN
50.0000 mL | Freq: Once | INTRAMUSCULAR | Status: AC | PRN
  Administered 2013-03-17: 50 mL via ORAL

## 2013-03-17 MED ORDER — POTASSIUM CHLORIDE IN NACL 20-0.9 MEQ/L-% IV SOLN
INTRAVENOUS | Status: DC
  Administered 2013-03-17 – 2013-03-18 (×2): via INTRAVENOUS
  Filled 2013-03-17 (×4): qty 1000

## 2013-03-17 MED ORDER — HYDROCODONE-ACETAMINOPHEN 5-325 MG PO TABS
1.0000 | ORAL_TABLET | ORAL | Status: DC | PRN
  Administered 2013-03-18: 2 via ORAL
  Filled 2013-03-17 (×2): qty 2

## 2013-03-17 MED ORDER — VITAMIN D3 25 MCG (1000 UNIT) PO TABS
1000.0000 [IU] | ORAL_TABLET | Freq: Every day | ORAL | Status: DC
  Administered 2013-03-17 – 2013-03-18 (×2): 1000 [IU] via ORAL
  Filled 2013-03-17 (×2): qty 1

## 2013-03-17 MED ORDER — ESTROGENS CONJUGATED 0.625 MG PO TABS
0.6250 mg | ORAL_TABLET | Freq: Every day | ORAL | Status: DC
Start: 2013-03-18 — End: 2013-03-18
  Administered 2013-03-18: 0.625 mg via ORAL
  Filled 2013-03-17: qty 1

## 2013-03-17 MED ORDER — PANTOPRAZOLE SODIUM 40 MG PO TBEC
40.0000 mg | DELAYED_RELEASE_TABLET | Freq: Every day | ORAL | Status: DC
  Administered 2013-03-17 – 2013-03-18 (×2): 40 mg via ORAL
  Filled 2013-03-17 (×2): qty 1

## 2013-03-17 MED ORDER — ONDANSETRON HCL 4 MG/2ML IJ SOLN
4.0000 mg | Freq: Four times a day (QID) | INTRAMUSCULAR | Status: DC | PRN
  Administered 2013-03-17: 4 mg via INTRAVENOUS
  Filled 2013-03-17: qty 2

## 2013-03-17 MED ORDER — ONDANSETRON HCL 4 MG/2ML IJ SOLN
4.0000 mg | Freq: Once | INTRAMUSCULAR | Status: AC
  Administered 2013-03-17: 4 mg via INTRAVENOUS
  Filled 2013-03-17: qty 2

## 2013-03-17 MED ORDER — AMLODIPINE BESYLATE 5 MG PO TABS
5.0000 mg | ORAL_TABLET | Freq: Every morning | ORAL | Status: DC
  Administered 2013-03-18: 5 mg via ORAL
  Filled 2013-03-17: qty 1

## 2013-03-17 MED ORDER — MORPHINE SULFATE 2 MG/ML IJ SOLN: 1.0000 mg | Freq: Four times a day (QID) | INTRAMUSCULAR | Status: DC | PRN

## 2013-03-17 MED ORDER — POLYVINYL ALCOHOL 1.4 % OP SOLN
1.0000 [drp] | OPHTHALMIC | Status: DC | PRN
  Filled 2013-03-17: qty 15

## 2013-03-17 MED ORDER — ACETAMINOPHEN 325 MG PO TABS: 650.0000 mg | ORAL_TABLET | Freq: Four times a day (QID) | ORAL | Status: DC | PRN

## 2013-03-17 MED ORDER — SODIUM CHLORIDE 0.9 % IV SOLN
INTRAVENOUS | Status: DC
  Administered 2013-03-17: 12:00:00 via INTRAVENOUS

## 2013-03-17 MED ORDER — LORAZEPAM 0.5 MG PO TABS
0.2500 mg | ORAL_TABLET | ORAL | Status: DC | PRN
  Administered 2013-03-17: 0.25 mg via ORAL
  Filled 2013-03-17: qty 1

## 2013-03-17 NOTE — H&P (Signed)
[facility] History and Physical  SHEELAH RITACCO AVW:[id number] DOB: 1930-02-24 DOA: 03/17/2013  Referring physician: ED PCP: Gaye Alken, MD   Chief Complaint:  Left lower quadrant abdominal pain with diarrhea for one day  HPI:  79 year old female with history off hypertension, hyperlipidemia, hypothyroidism, GERD, who was in his state of health until yesterday when she started having 3 episodes of watery diarrhea. Followed by severe cramping pain over the left lower quadrant of her abdomen and radiated to mid abdomen. She then noticed 2-3 episodes of bright red blood not mixed with stool. She denies similar symptoms in the past. Denies taking any new medications or over-the-counter NSAIDs. Denies eating anything outside or recent travel. Informs having colonoscopy over 10 years back. She had some nausea but denies any fevers, chills, vomiting, dizziness, blurry vision, headache, chest, palpitations, shortness of breath, urinary symptoms or joint pains. She has not had any bowel movements today.  In the ED her vitals were stable. Labs showed a mild hyponatremia and hypokalemia. CT of her abdomen and pelvis done showed diffuse colitis involving transverse to mid sigmoid colon with maximum stranding in the descending and proximal sigmoid segments. Also noted for diverticulosis of some segments and suggestive off infectious colitis . Patient given a dose of IV ciprofloxacin and Flagyl and [facility] called for admission.  Review of Systems:  Constitutional: Denies fever, chills, diaphoresis, appetite change and fatigue.  HEENT: Denies photophobia, eye pain, redness, hearing loss, ear pain, congestion, sore throat, rhinorrhea, sneezing, mouth sores, trouble swallowing, neck pain, neck stiffness and tinnitus.   Respiratory: Denies SOB, DOE, cough, chest tightness,  and wheezing.   Cardiovascular: Denies chest pain, palpitations and leg swelling.  Gastrointestinal: Nausea  present, abdominal pain, diarrhea, blood in stool. Denies vomiting, constipation,  and abdominal distention.  Genitourinary: Denies dysuria, urgency, frequency, hematuria, flank pain and difficulty urinating.  Musculoskeletal: Denies myalgias, back pain, joint swelling, arthralgias and gait problem.  Skin: Denies pallor, rash and wound.  Neurological: Denies dizziness, seizures, syncope, weakness, light-headedness, numbness and headaches.  Hematological: Denies adenopathy. Easy bruising, personal or family bleeding history  Psychiatric/Behavioral: Denies suicidal ideation, mood changes, confusion, nervousness, sleep disturbance and agitation   Past Medical History  Diagnosis Date  . PONV (postoperative nausea and vomiting)   . Hypertension   . Hyperlipemia   . Hypothyroidism   . GERD (gastroesophageal reflux disease)   . Arthritis   . Depression   . Deaf, left    Past Surgical History  Procedure Laterality Date  . Abdominal hysterectomy    . Cholecystectomy    . Tonsillectomy    . Appendectomy    . Thyroidectomy, partial    . Ercp    . Colonoscopy    . Eye surgery      cataracts  . Knee arthroscopy  05/11/2012    Procedure: ARTHROSCOPY KNEE;  Surgeon: Velna Ochs, MD;  Location: [facility];  Service: Orthopedics;  Laterality: Right;  left knee medial menisectomy and chondroplasty  . Esophagogastroduodenoscopy  10/20/2012    Procedure: ESOPHAGOGASTRODUODENOSCOPY (EGD);  Surgeon: Willis Modena, MD;  Location: [facility];  Service: Endoscopy;  Laterality: Left;   Social History:  reports that she has never smoked. She has never used smokeless tobacco. She reports that she does not drink alcohol or use illicit drugs.  Allergies  Allergen Reactions  . Morphine And Related Hives    Family History  Problem Relation Age of Onset  . Kidney disease Mother   .  Heart attack Father     Prior to Admission medications   Medication Sig Start Date End Date  Taking? Authorizing Provider  amLODipine (NORVASC) 5 MG tablet Take 5 mg by mouth every morning.    Yes Historical Provider, MD  aspirin EC 81 MG tablet Take 81 mg by mouth at bedtime.   Yes Historical Provider, MD  cholecalciferol (VITAMIN D) 1000 UNITS tablet Take 1,000 Units by mouth daily.   Yes Historical Provider, MD  esomeprazole (NEXIUM) 40 MG capsule Take 40 mg by mouth at bedtime. 10/22/12  Yes Marden Noble, MD  estrogens, conjugated, (PREMARIN) 0.625 MG tablet Take 0.625 mg by mouth daily.    Yes Historical Provider, MD  FLUoxetine (PROZAC) 40 MG capsule Take 40 mg by mouth every morning.    Yes Historical Provider, MD  lactose free nutrition (BOOST PLUS) LIQD Take 237 mLs by mouth daily.   Yes Historical Provider, MD  levothyroxine (SYNTHROID, LEVOTHROID) 100 MCG tablet Take 100 mcg by mouth every morning.    Yes Historical Provider, MD  Nutritional Supplements (JUICE PLUS FIBRE PO) Take 1 capsule by mouth daily.   Yes Historical Provider, MD  polyvinyl alcohol (LIQUIFILM TEARS) 1.4 % ophthalmic solution Place 1 drop into both eyes as needed (dry eyes).   Yes Historical Provider, MD  Probiotic Product (ALIGN PO) Take 1 tablet by mouth at bedtime.   Yes Historical Provider, MD  telmisartan (MICARDIS) 80 MG tablet Take 80 mg by mouth every morning.    Yes Historical Provider, MD  trolamine salicylate (ASPERCREME) 10 % cream Apply 1 application topically as needed (pain).   Yes Historical Provider, MD    Physical Exam:  Filed Vitals:   03/17/13 1121 03/17/13 1427 03/17/13 1515 03/17/13 1555  BP: 153/82 158/74 168/74 149/69  Pulse: 98 66  70  Temp: 97.9 F (36.6 C)  97.9 F (36.6 C)   TempSrc: Oral  Oral   Resp: 16 16 16 16   SpO2: 100% 96% 96% 97%    Constitutional: Vital signs reviewed.  Patient is a well-developed and well-nourished in no acute distress and cooperative with exam. Alert and oriented x3.  Head: Normocephalic and atraumatic Ear: TM normal bilaterally Mouth: no  erythema or exudates, moist oral mucosa. Eyes: PERRL, EOMI, conjunctivae normal, No scleral icterus.  Neck: Supple, Trachea midline normal ROM, No JVD, mass, thyromegaly, or carotid bruit present.  Cardiovascular: RRR, S1 normal, S2 normal, no MRG, pulses symmetric and intact bilaterally Pulmonary/Chest: CTAB, no wheezes, rales, or rhonchi Abdominal: Soft. non-distended, bowel sounds are normal, tender to palpation mainly over left lower quadrant, no guarding or rigidity, no masses, organomegaly,  GU: no CVA tenderness Musculoskeletal: No joint deformities, erythema, or stiffness, ROM full and no nontender Ext: no edema and no cyanosis, pulses palpable bilaterally (DP and PT) Hematology: no cervical, inginal, or axillary adenopathy.  Neurological: A&O x3, Strenght is normal and symmetric bilaterally, cranial nerve II-XII are grossly intact, no focal motor deficit, sensory intact to light touch bilaterally.  Skin: Warm, dry and intact. No rash, cyanosis, or clubbing.  Psychiatric: Normal mood and affect. speech and behavior is normal. Judgment and thought content normal. Cognition and memory are normal.   Labs on Admission:  Basic Metabolic Panel:  Recent Labs Lab 03/17/13 1215  NA 128*  K 3.4*  CL 90*  CO2 25  GLUCOSE 127*  BUN 12  CREATININE 0.65  CALCIUM 9.6   Liver Function Tests:  Recent Labs Lab 03/17/13 1215  AST 35  ALT 25  ALKPHOS 54  BILITOT 0.5  PROT 7.8  ALBUMIN 3.6   No results found for this basename: LIPASE, AMYLASE,  in the last 168 hours No results found for this basename: AMMONIA,  in the last 168 hours CBC:  Recent Labs Lab 03/17/13 1142  WBC 8.5  NEUTROABS 5.6  HGB 13.8  HCT 39.8  MCV 91.7  PLT 314   Cardiac Enzymes: No results found for this basename: CKTOTAL, CKMB, CKMBINDEX, TROPONINI,  in the last 168 hours BNP: No components found with this basename: POCBNP,  CBG: No results found for this basename: GLUCAP,  in the last 168  hours  Radiological Exams on Admission: Ct Abdomen Pelvis W Contrast  03/17/2013  *RADIOLOGY REPORT*  Clinical Data: 79 year old female left lower quadrant pain.  Rectal bleeding times 1 day.  Nausea diarrhea.  CT ABDOMEN AND PELVIS WITH CONTRAST  Technique:  Multidetector CT imaging of the abdomen and pelvis was performed following the standard protocol during bolus administration of intravenous contrast.  Contrast: 50mL OMNIPAQUE IOHEXOL 300 MG/ML  SOLN, OMNIPAQUE IOHEXOL 300 MG/ML  SOLN  Comparison: 04/04/2010 and earlier.  Findings: Stable and negative lung bases.  Stable bones.  The advanced lower lumbar facet and disc degeneration. No acute osseous abnormality identified.  The rectum is no longer distended.  No pelvic free fluid.  Uterus surgically absent.  Unremarkable bladder.  Wall thickening in the colon from the hepatic flexure to the mid sigmoid, maximal in the descending and proximal sigmoid segments were there is mild associated mesenteric stranding.  Sigmoid and descending diverticula. More normal appearance of the right colon. Oral contrast has chest reached the ileocecal valve.  No dilated small bowel.  Negative stomach and duodenum.  Chronic hepatic steatosis.  Gallbladder surgically absent.  Spleen, pancreas, adrenal glands, and kidneys are stable.  Portal venous system is patent.  Major arterial structures in the abdomen pelvis are patent with mild for age atherosclerosis.  No abdominal free fluid.  No free air. Stable small nodular subcutaneous foci in the suprapubic region which may be associated with a remote C-section scar.  IMPRESSION: 1.  Colitis involving transverse through mid sigmoid colon segments.  Wall thickening and mild mesenteric stranding maximal in the descending and proximal sigmoid segments.  Diverticulosis of some segment, that the broader area of involvement suggests a more generalized infectious colitis.  No free fluid, free air, or abscess. 2.  Otherwise stable  abdomen pelvis.   Original Report Authenticated By: Erskine Speed, M.D.       Assessment/Plan Principal Problem:   Colitis, acute Possibly infectious as per CT findings with with associated diverticulitis which cannot be ruled out. Admit to MedSurg. Patient appears comfortable at this time. I will start her on IV hydration. Will treat empirically with IV ciprofloxacin and Flagyl. I will start her on clear liquids. When necessary Zofran for nausea. -send stool for C. difficile and culture -Patient clinically stable at this time and her H&H is stable and does not need a GI evaluation at present. GI can be consulted if patient has a worsening symptoms with signs of bowel function no ischemia, further lower GI bleed or drop in H&H. -Patient follows with Dr. Dulce Sellar [facility]).   Lower GI bleed  Likely secondary to infection versus diverticular bleed. Hemoglobin appears stable. We'll monitor with serial H&H.    Active Problems:   Hyponatremia In the setting of dehydration. Continue IV fluids    Hypokalemia Replenish with KCl  Hypothyroidism Continue Synthroid    Hypertension Continue amlodipine    GERD (gastroesophageal reflux disease) Continue PPI  Diet: Clear liquids DVT prophylaxis: SCD  Code Status: Full code Family Communication: Daughter at bedside Disposition Plan: Home once stable  Eddie North [facility] Pager [phone]  If 7PM-7AM, please contact night-coverage [email or web address] Password [facility] 03/17/2013, 3:57 PM   Time spent: 70 minutes

## 2013-03-17 NOTE — ED Provider Notes (Signed)
History     CSN: [id number]  Arrival date & time 03/17/13  1110   First MD Initiated Contact with Patient 03/17/13 1112      Chief Complaint  Patient presents with  . Diarrhea  . Nausea  . Rectal Pain    (Consider location/radiation/quality/duration/timing/severity/associated sxs/prior treatment) HPI Comments: Patient comes to the ER for evaluation of bloody diarrhea. Patient reports that she had onset of diarrhea yesterday with associated nausea but no vomiting. This morning she has continued to have diarrhea and now she has been noticing blood in the stool. She does report lower abdominal cramping. She has not had any fever.  Patient is a 79 y.o. female presenting with diarrhea.  Diarrhea Associated symptoms: abdominal pain     Past Medical History  Diagnosis Date  . PONV (postoperative nausea and vomiting)   . Hypertension   . Hyperlipemia   . Hypothyroidism   . GERD (gastroesophageal reflux disease)   . Arthritis   . Depression   . Deaf, left     Past Surgical History  Procedure Laterality Date  . Abdominal hysterectomy    . Cholecystectomy    . Tonsillectomy    . Appendectomy    . Thyroidectomy, partial    . Ercp    . Colonoscopy    . Eye surgery      cataracts  . Knee arthroscopy  05/11/2012    Procedure: ARTHROSCOPY KNEE;  Surgeon: Velna Ochs, MD;  Location: [facility];  Service: Orthopedics;  Laterality: Right;  left knee medial menisectomy and chondroplasty  . Esophagogastroduodenoscopy  10/20/2012    Procedure: ESOPHAGOGASTRODUODENOSCOPY (EGD);  Surgeon: Willis Modena, MD;  Location: [facility];  Service: Endoscopy;  Laterality: Left;    No family history on file.  History  Substance Use Topics  . Smoking status: Never Smoker   . Smokeless tobacco: Not on file  . Alcohol Use: No    OB History   Grav Para Term Preterm Abortions TAB SAB Ect Mult Living                  Review of Systems  Gastrointestinal: Positive for  abdominal pain, diarrhea, anal bleeding and rectal pain.  All other systems reviewed and are negative.    Allergies  Morphine and related  Home Medications   Current Outpatient Rx  Name  Route  Sig  Dispense  Refill  . amLODipine (NORVASC) 5 MG tablet   Oral   Take 5 mg by mouth every morning.          Marland Kitchen aspirin EC 81 MG tablet   Oral   Take 81 mg by mouth at bedtime.         . cholecalciferol (VITAMIN D) 1000 UNITS tablet   Oral   Take 1,000 Units by mouth daily.         Marland Kitchen esomeprazole (NEXIUM) 40 MG capsule   Oral   Take 40 mg by mouth at bedtime.         Marland Kitchen estrogens, conjugated, (PREMARIN) 0.625 MG tablet   Oral   Take 0.625 mg by mouth daily.          Marland Kitchen FLUoxetine (PROZAC) 40 MG capsule   Oral   Take 40 mg by mouth every morning.          . lactose free nutrition (BOOST PLUS) LIQD   Oral   Take 237 mLs by mouth daily.         Marland Kitchen  levothyroxine (SYNTHROID, LEVOTHROID) 100 MCG tablet   Oral   Take 100 mcg by mouth every morning.          . Nutritional Supplements (JUICE PLUS FIBRE PO)   Oral   Take 1 capsule by mouth daily.         . polyvinyl alcohol (LIQUIFILM TEARS) 1.4 % ophthalmic solution   Both Eyes   Place 1 drop into both eyes as needed (dry eyes).         . Probiotic Product (ALIGN PO)   Oral   Take 1 tablet by mouth at bedtime.         Marland Kitchen telmisartan (MICARDIS) 80 MG tablet   Oral   Take 80 mg by mouth every morning.          . trolamine salicylate (ASPERCREME) 10 % cream   Topical   Apply 1 application topically as needed (pain).           BP 153/82  Pulse 98  Temp(Src) 97.9 F (36.6 C) (Oral)  Resp 16  SpO2 100%  Physical Exam  Constitutional: She is oriented to person, place, and time. She appears well-developed and well-nourished. No distress.  HENT:  Head: Normocephalic and atraumatic.  Right Ear: Hearing normal.  Nose: Nose normal.  Mouth/Throat: Oropharynx is clear and moist and mucous membranes  are normal.  Eyes: Conjunctivae and EOM are normal. Pupils are equal, round, and reactive to light.  Neck: Normal range of motion. Neck supple.  Cardiovascular: Normal rate, regular rhythm, S1 normal and S2 normal.  Exam reveals no gallop and no friction rub.   No murmur heard. Pulmonary/Chest: Effort normal and breath sounds normal. No respiratory distress. She exhibits no tenderness.  Abdominal: Soft. Normal appearance and bowel sounds are normal. There is no hepatosplenomegaly. There is tenderness in the left lower quadrant. There is no rebound, no guarding, no tenderness at McBurney's point and negative Murphy's sign. No hernia.  Musculoskeletal: Normal range of motion.  Neurological: She is alert and oriented to person, place, and time. She has normal strength. No cranial nerve deficit or sensory deficit. Coordination normal. GCS eye subscore is 4. GCS verbal subscore is 5. GCS motor subscore is 6.  Skin: Skin is warm, dry and intact. No rash noted. No cyanosis.  Psychiatric: She has a normal mood and affect. Her speech is normal and behavior is normal. Thought content normal.    ED Course  Procedures (including critical care time)  Labs Reviewed  URINALYSIS, ROUTINE W REFLEX MICROSCOPIC - Abnormal; Notable for the following:    Leukocytes, UA TRACE (*)    All other components within normal limits  COMPREHENSIVE METABOLIC PANEL - Abnormal; Notable for the following:    Sodium 128 (*)    Potassium 3.4 (*)    Chloride 90 (*)    Glucose, Bld 127 (*)    GFR calc non Af Amer 81 (*)    All other components within normal limits  URINE MICROSCOPIC-ADD ON - Abnormal; Notable for the following:    Bacteria, UA FEW (*)    All other components within normal limits  CBC WITH DIFFERENTIAL  TYPE AND SCREEN  ABO/RH   Ct Abdomen Pelvis W Contrast  03/17/2013  *RADIOLOGY REPORT*  Clinical Data: 79 year old female left lower quadrant pain.  Rectal bleeding times 1 day.  Nausea diarrhea.  CT  ABDOMEN AND PELVIS WITH CONTRAST  Technique:  Multidetector CT imaging of the abdomen and pelvis was performed following the standard  protocol during bolus administration of intravenous contrast.  Contrast: 50mL OMNIPAQUE IOHEXOL 300 MG/ML  SOLN, OMNIPAQUE IOHEXOL 300 MG/ML  SOLN  Comparison: 04/04/2010 and earlier.  Findings: Stable and negative lung bases.  Stable bones.  The advanced lower lumbar facet and disc degeneration. No acute osseous abnormality identified.  The rectum is no longer distended.  No pelvic free fluid.  Uterus surgically absent.  Unremarkable bladder.  Wall thickening in the colon from the hepatic flexure to the mid sigmoid, maximal in the descending and proximal sigmoid segments were there is mild associated mesenteric stranding.  Sigmoid and descending diverticula. More normal appearance of the right colon. Oral contrast has chest reached the ileocecal valve.  No dilated small bowel.  Negative stomach and duodenum.  Chronic hepatic steatosis.  Gallbladder surgically absent.  Spleen, pancreas, adrenal glands, and kidneys are stable.  Portal venous system is patent.  Major arterial structures in the abdomen pelvis are patent with mild for age atherosclerosis.  No abdominal free fluid.  No free air. Stable small nodular subcutaneous foci in the suprapubic region which may be associated with a remote C-section scar.  IMPRESSION: 1.  Colitis involving transverse through mid sigmoid colon segments.  Wall thickening and mild mesenteric stranding maximal in the descending and proximal sigmoid segments.  Diverticulosis of some segment, that the broader area of involvement suggests a more generalized infectious colitis.  No free fluid, free air, or abscess. 2.  Otherwise stable abdomen pelvis.   Original Report Authenticated By: Erskine Speed, M.D.      Diagnoses: Diffuse colitis    MDM  Patient comes to the ER for evaluation of diarrhea with rectal bleeding. Symptoms began yesterday and  continued today. Patient is complaining of some mild diffuse abdominal pain. Examination reveals tenderness in the left abdomen. Patient does report a history of diverticulitis. Patient's vital signs are stable. Hemoglobin is within normal limits. She does not have a white count or fever. CAT scan was performed to further evaluate and there is evidence of diffuse colitis. Patient will require hospitalization with treatment of IV antibiotics, IV fluids and monitoring hemoglobin.       Gilda Crease, MD 03/17/13 1426

## 2013-03-17 NOTE — ED Notes (Signed)
Pt c/o nausea and diarrhea since yesterday. Pt does reports some blood in stool last night, but no blood in stool this morning.

## 2013-03-17 NOTE — ED Notes (Signed)
[id number]<AV> Expected date:<BR> Expected time:<BR> Means of arrival:<BR> Comments:<BR> ? Kidney stone

## 2013-03-18 DIAGNOSIS — E876 Hypokalemia: Secondary | ICD-10-CM | POA: Diagnosis not present

## 2013-03-18 DIAGNOSIS — E871 Hypo-osmolality and hyponatremia: Secondary | ICD-10-CM | POA: Diagnosis not present

## 2013-03-18 DIAGNOSIS — K5289 Other specified noninfective gastroenteritis and colitis: Secondary | ICD-10-CM | POA: Diagnosis not present

## 2013-03-18 DIAGNOSIS — I10 Essential (primary) hypertension: Secondary | ICD-10-CM

## 2013-03-18 LAB — CBC
MCH: 31.5 pg (ref 26.0–34.0)
MCV: 92.1 fL (ref 78.0–100.0)
Platelets: 295 10*3/uL (ref 150–400)
RBC: 4.06 MIL/uL (ref 3.87–5.11)
RDW: 12.8 % (ref 11.5–15.5)
WBC: 8.8 10*3/uL (ref 4.0–10.5)

## 2013-03-18 LAB — BASIC METABOLIC PANEL
CO2: 24 mEq/L (ref 19–32)
Calcium: 8.8 mg/dL (ref 8.4–10.5)
Creatinine, Ser: 0.76 mg/dL (ref 0.50–1.10)
GFR calc non Af Amer: 77 mL/min — ABNORMAL LOW (ref >90)
Glucose, Bld: 103 mg/dL — ABNORMAL HIGH (ref 70–99)
Sodium: 132 mEq/L — ABNORMAL LOW (ref 135–145)

## 2013-03-18 LAB — GLUCOSE, CAPILLARY: Glucose-Capillary: 136 mg/dL — ABNORMAL HIGH (ref 70–99)

## 2013-03-18 MED ORDER — LORAZEPAM 0.5 MG PO TABS
0.2500 mg | ORAL_TABLET | Freq: Every evening | ORAL | Status: DC | PRN
Start: 2013-03-18 — End: 2013-09-09

## 2013-03-18 MED ORDER — PNEUMOCOCCAL VAC POLYVALENT 25 MCG/0.5ML IJ INJ: 0.5000 mL | INJECTION | INTRAMUSCULAR | Status: DC

## 2013-03-18 MED ORDER — HYDROCODONE-ACETAMINOPHEN 5-325 MG PO TABS: 1.0000 | ORAL_TABLET | Freq: Four times a day (QID) | ORAL | Status: DC | PRN

## 2013-03-18 MED ORDER — SODIUM CHLORIDE 0.9 % IJ SOLN
INTRAMUSCULAR | Status: AC
  Filled 2013-03-18: qty 3

## 2013-03-18 MED ORDER — CIPROFLOXACIN HCL 500 MG PO TABS: 500.0000 mg | ORAL_TABLET | Freq: Two times a day (BID) | ORAL | Status: DC

## 2013-03-18 MED ORDER — METRONIDAZOLE 500 MG PO TABS: 500.0000 mg | ORAL_TABLET | Freq: Three times a day (TID) | ORAL | Status: DC

## 2013-03-18 MED ORDER — PNEUMOCOCCAL VAC POLYVALENT 25 MCG/0.5ML IJ INJ
0.5000 mL | INJECTION | Freq: Once | INTRAMUSCULAR | Status: AC
  Administered 2013-03-18: 0.5 mL via INTRAMUSCULAR
  Filled 2013-03-18 (×2): qty 0.5

## 2013-03-18 NOTE — Progress Notes (Addendum)
DC to home. To car by wc. No change from AM assessment. PNA Vac given prior to dc.  Hartley Barefoot RN 03/18/2013   On 03/18/13 Patient received Vicodin 2 tabs for abdominal pain at time of DC. Failed to chart med administration in EPIC prior to patient leaving floor for DC. Unable to scan patient Identification bracelet at that point. Failed to chart med administration after DC of patient.Hartley Barefoot

## 2013-03-18 NOTE — Discharge Summary (Addendum)
Physician Discharge Summary  Cindy [name] WUJ:[id number] DOB: 07/22/1930 DOA: 03/17/2013  PCP: Gaye Alken, MD  Admit date: 03/17/2013 Discharge date: 03/18/2013  Time spent: 40 minutes  Recommendations for Outpatient Follow-up:  1. Home with PCP follow up in 1 week  Discharge Diagnoses:  Principal Problem:   Colitis, acute   Active Problems:   Hyponatremia   Hypokalemia   Hypothyroidism   Hypertension   GERD (gastroesophageal reflux disease)   Discharge Condition: fair  Diet recommendation: low sodium  Filed Weights   03/17/13 1641 03/18/13 0606  Weight: 68.6 kg (151 lb 3.8 oz) 66.3 kg (146 lb 2.6 oz)    History of present illness:  Please refer to admission H&P for details , but in brief, 79 year old female with history off hypertension, hyperlipidemia, hypothyroidism, GERD, who was in his state of health until yesterday when she started having 3 episodes of watery diarrhea. Followed by severe cramping pain over the left lower quadrant of her abdomen and radiated to mid abdomen. She then noticed 2-3 episodes of bright red blood not mixed with stool. She denies similar symptoms in the past. Denies taking any new medications or over-the-counter NSAIDs. Denies eating anything outside or recent travel. Informs having colonoscopy over 10 years back. She had some nausea but denies any fevers, chills, vomiting, dizziness, blurry vision, headache, chest, palpitations, shortness of breath, urinary symptoms or joint pains. In the ED her vitals were stable. Labs showed a mild hyponatremia and hypokalemia. CT of her abdomen and pelvis done showed diffuse colitis involving transverse to mid sigmoid colon with maximum stranding in the descending and proximal sigmoid segments. Also noted for diverticulosis of some segments and suggestive off infectious colitis  . Patient given a dose of IV ciprofloxacin and Flagyl and [facility] called for admission.   Hospital Course:    Colitis, acute  Possibly infectious as per CT findings with with associated diverticulitis which cannot be ruled out.  Started her on IV hydration and  empirically on  IV ciprofloxacin and Flagyl. Stool for c diff negative. Stool cx pending. Etiology likely infectious with no signs of bowel ischemia  or obstruction on exam or imaging. Symptoms improved overnight. Tolerating regular diet now. Has been afebrile all the time and wbc wnl.  Diarrhea improved. No further rectal bleeding. Will discharge her on a course of  cipro and flagyl to complete a 10 day course. Follow up with PCP in 1 week.    Lower GI bleed  Likely secondary to infection. Hemoglobin appears stable. Now resolved.  Active Problems:   Hyponatremia  In the setting of dehydration. Resolved  Hypokalemia  Replenished with KCl    remaining medical issues are stable.    Procedures:  none  Consultations:  none  Discharge Exam: Filed Vitals:   03/17/13 1641 03/17/13 2100 03/18/13 0606 03/18/13 0610  BP: 166/67 131/59 112/57 110/60  Pulse: 75 76 66   Temp: 97.8 F (36.6 C) 98.1 F (36.7 C) 97.7 F (36.5 C)   TempSrc: Oral Oral Oral   Resp: 16 18 16    Height: 5\' 5"  (1.651 m)     Weight: 68.6 kg (151 lb 3.8 oz)  66.3 kg (146 lb 2.6 oz)   SpO2: 96% 98% 97%     General: elderly female  lying in bed in no acute distress,  HEENT: No pallor, dry oral mucosa  Cardiovascular: S1-S2 normal, no murmurs rub or gallop  Respiratory: clear b/l  Abdomen: Soft, non distended, minimal LLQ tednerness, bowel  sounds present  Musculoskeletal: warm, no edema  CNS: AAO x3   Discharge Instructions     Medication List    TAKE these medications       ALIGN PO  Take 1 tablet by mouth at bedtime.     amLODipine 5 MG tablet  Commonly known as:  NORVASC  Take 5 mg by mouth every morning.     aspirin EC 81 MG tablet  Take 81 mg by mouth at bedtime.     cholecalciferol 1000 UNITS tablet  Commonly known as:   VITAMIN D  Take 1,000 Units by mouth daily.     ciprofloxacin 500 MG tablet  Commonly known as:  CIPRO  Take 1 tablet (500 mg total) by mouth 2 (two) times daily. Until 4/6     esomeprazole 40 MG capsule  Commonly known as:  NEXIUM  Take 40 mg by mouth at bedtime.     estrogens (conjugated) 0.625 MG tablet  Commonly known as:  PREMARIN  Take 0.625 mg by mouth daily.     FLUoxetine 40 MG capsule  Commonly known as:  PROZAC  Take 40 mg by mouth every morning.     HYDROcodone-acetaminophen 5-325 MG per tablet  Commonly known as:  NORCO/VICODIN  Take 1-2 tablets by mouth every 6 (six) hours as needed.     JUICE PLUS FIBRE PO  Take 1 capsule by mouth daily.     lactose free nutrition Liqd  Take 237 mLs by mouth daily.     levothyroxine 100 MCG tablet  Commonly known as:  SYNTHROID, LEVOTHROID  Take 100 mcg by mouth every morning.     metroNIDAZOLE 500 MG tablet  Commonly known as:  FLAGYL  Take 1 tablet (500 mg total) by mouth 3 (three) times daily. Until 4/6     polyvinyl alcohol 1.4 % ophthalmic solution  Commonly known as:  LIQUIFILM TEARS  Place 1 drop into both eyes as needed (dry eyes).     telmisartan 80 MG tablet  Commonly known as:  MICARDIS  Take 80 mg by mouth every morning.     trolamine salicylate 10 % cream  Commonly known as:  ASPERCREME  Apply 1 application topically as needed (pain).           Follow-up Information   Follow up with Gaye Alken, MD In 1 week.   Contact information:   [address] [place] [address] [phone]        The results of significant diagnostics from this hospitalization (including imaging, microbiology, ancillary and laboratory) are listed below for reference.    Significant Diagnostic Studies: Ct Abdomen Pelvis W Contrast  03/17/2013  *RADIOLOGY REPORT*  Clinical Data: 79 year old female left lower quadrant pain.  Rectal bleeding times 1 day.  Nausea diarrhea.  CT ABDOMEN AND PELVIS WITH  CONTRAST  Technique:  Multidetector CT imaging of the abdomen and pelvis was performed following the standard protocol during bolus administration of intravenous contrast.  Contrast: 50mL OMNIPAQUE IOHEXOL 300 MG/ML  SOLN, OMNIPAQUE IOHEXOL 300 MG/ML  SOLN  Comparison: 04/04/2010 and earlier.  Findings: Stable and negative lung bases.  Stable bones.  The advanced lower lumbar facet and disc degeneration. No acute osseous abnormality identified.  The rectum is no longer distended.  No pelvic free fluid.  Uterus surgically absent.  Unremarkable bladder.  Wall thickening in the colon from the hepatic flexure to the mid sigmoid, maximal in the descending and proximal sigmoid segments were there is mild  associated mesenteric stranding.  Sigmoid and descending diverticula. More normal appearance of the right colon. Oral contrast has chest reached the ileocecal valve.  No dilated small bowel.  Negative stomach and duodenum.  Chronic hepatic steatosis.  Gallbladder surgically absent.  Spleen, pancreas, adrenal glands, and kidneys are stable.  Portal venous system is patent.  Major arterial structures in the abdomen pelvis are patent with mild for age atherosclerosis.  No abdominal free fluid.  No free air. Stable small nodular subcutaneous foci in the suprapubic region which may be associated with a remote C-section scar.  IMPRESSION: 1.  Colitis involving transverse through mid sigmoid colon segments.  Wall thickening and mild mesenteric stranding maximal in the descending and proximal sigmoid segments.  Diverticulosis of some segment, that the broader area of involvement suggests a more generalized infectious colitis.  No free fluid, free air, or abscess. 2.  Otherwise stable abdomen pelvis.   Original Report Authenticated By: Erskine Speed, M.D.     Microbiology: Recent Results (from the past 240 hour(s))  CLOSTRIDIUM DIFFICILE BY PCR     Status: None   Collection Time    03/17/13  5:30 PM      Result Value  Range Status   C difficile by pcr NEGATIVE  NEGATIVE Final     Labs: Basic Metabolic Panel:  Recent Labs Lab 03/17/13 1215 03/18/13 0430  NA 128* 132*  K 3.4* 3.9  CL 90* 97  CO2 25 24  GLUCOSE 127* 103*  BUN 12 10  CREATININE 0.65 0.76  CALCIUM 9.6 8.8   Liver Function Tests:  Recent Labs Lab 03/17/13 1215  AST 35  ALT 25  ALKPHOS 54  BILITOT 0.5  PROT 7.8  ALBUMIN 3.6   No results found for this basename: LIPASE, AMYLASE,  in the last 168 hours No results found for this basename: AMMONIA,  in the last 168 hours CBC:  Recent Labs Lab 03/17/13 1142 03/18/13 0430  WBC 8.5 8.8  NEUTROABS 5.6  --   HGB 13.8 12.8  HCT 39.8 37.4  MCV 91.7 92.1  PLT 314 295   Cardiac Enzymes: No results found for this basename: CKTOTAL, CKMB, CKMBINDEX, TROPONINI,  in the last 168 hours BNP: BNP (last 3 results) No results found for this basename: PROBNP,  in the last 8760 hours CBG:  Recent Labs Lab 03/18/13 0806  GLUCAP 136*       Signed:  Jasmon Graffam  [facility] 03/18/2013, 11:19 AM

## 2013-03-22 LAB — STOOL CULTURE: Special Requests: NORMAL

## 2013-03-28 MED FILL — Hydrocodone-Acetaminophen Tab 5-325 MG: ORAL | Qty: 2 | Status: AC

## 2013-03-29 DIAGNOSIS — L259 Unspecified contact dermatitis, unspecified cause: Secondary | ICD-10-CM | POA: Diagnosis not present

## 2013-03-29 DIAGNOSIS — E878 Other disorders of electrolyte and fluid balance, not elsewhere classified: Secondary | ICD-10-CM | POA: Diagnosis not present

## 2013-03-29 DIAGNOSIS — K5289 Other specified noninfective gastroenteritis and colitis: Secondary | ICD-10-CM | POA: Diagnosis not present

## 2013-03-29 DIAGNOSIS — G4700 Insomnia, unspecified: Secondary | ICD-10-CM | POA: Diagnosis not present

## 2013-04-12 DIAGNOSIS — Z1231 Encounter for screening mammogram for malignant neoplasm of breast: Secondary | ICD-10-CM | POA: Diagnosis not present

## 2013-04-19 DIAGNOSIS — N63 Unspecified lump in unspecified breast: Secondary | ICD-10-CM | POA: Diagnosis not present

## 2013-04-21 DIAGNOSIS — K3184 Gastroparesis: Secondary | ICD-10-CM | POA: Diagnosis not present

## 2013-04-25 DIAGNOSIS — M1710 Unilateral primary osteoarthritis, unspecified knee: Secondary | ICD-10-CM | POA: Diagnosis not present

## 2013-04-25 DIAGNOSIS — M545 Low back pain: Secondary | ICD-10-CM | POA: Diagnosis not present

## 2013-07-19 DIAGNOSIS — E559 Vitamin D deficiency, unspecified: Secondary | ICD-10-CM | POA: Diagnosis not present

## 2013-07-19 DIAGNOSIS — Z79899 Other long term (current) drug therapy: Secondary | ICD-10-CM | POA: Diagnosis not present

## 2013-07-19 DIAGNOSIS — E039 Hypothyroidism, unspecified: Secondary | ICD-10-CM | POA: Diagnosis not present

## 2013-07-19 DIAGNOSIS — E7800 Pure hypercholesterolemia, unspecified: Secondary | ICD-10-CM | POA: Diagnosis not present

## 2013-07-19 DIAGNOSIS — E538 Deficiency of other specified B group vitamins: Secondary | ICD-10-CM | POA: Diagnosis not present

## 2013-07-19 DIAGNOSIS — R5381 Other malaise: Secondary | ICD-10-CM | POA: Diagnosis not present

## 2013-07-19 DIAGNOSIS — G4700 Insomnia, unspecified: Secondary | ICD-10-CM | POA: Diagnosis not present

## 2013-07-19 DIAGNOSIS — I10 Essential (primary) hypertension: Secondary | ICD-10-CM | POA: Diagnosis not present

## 2013-07-29 DIAGNOSIS — K6289 Other specified diseases of anus and rectum: Secondary | ICD-10-CM | POA: Diagnosis not present

## 2013-07-29 DIAGNOSIS — R197 Diarrhea, unspecified: Secondary | ICD-10-CM | POA: Diagnosis not present

## 2013-08-19 DIAGNOSIS — R197 Diarrhea, unspecified: Secondary | ICD-10-CM | POA: Diagnosis not present

## 2013-08-19 DIAGNOSIS — K6289 Other specified diseases of anus and rectum: Secondary | ICD-10-CM | POA: Diagnosis not present

## 2013-08-31 ENCOUNTER — Other Ambulatory Visit: Payer: Self-pay | Admitting: Gastroenterology

## 2013-08-31 DIAGNOSIS — C218 Malignant neoplasm of overlapping sites of rectum, anus and anal canal: Secondary | ICD-10-CM | POA: Diagnosis not present

## 2013-08-31 DIAGNOSIS — K6289 Other specified diseases of anus and rectum: Secondary | ICD-10-CM | POA: Diagnosis not present

## 2013-08-31 DIAGNOSIS — K625 Hemorrhage of anus and rectum: Secondary | ICD-10-CM | POA: Diagnosis not present

## 2013-08-31 DIAGNOSIS — C210 Malignant neoplasm of anus, unspecified: Secondary | ICD-10-CM | POA: Diagnosis not present

## 2013-08-31 DIAGNOSIS — C187 Malignant neoplasm of sigmoid colon: Secondary | ICD-10-CM | POA: Diagnosis not present

## 2013-08-31 DIAGNOSIS — C189 Malignant neoplasm of colon, unspecified: Secondary | ICD-10-CM

## 2013-08-31 HISTORY — PX: COLONOSCOPY: SHX174

## 2013-08-31 HISTORY — DX: Malignant neoplasm of colon, unspecified: C18.9

## 2013-09-09 ENCOUNTER — Encounter (INDEPENDENT_AMBULATORY_CARE_PROVIDER_SITE_OTHER): Payer: Self-pay | Admitting: General Surgery

## 2013-09-09 ENCOUNTER — Ambulatory Visit (INDEPENDENT_AMBULATORY_CARE_PROVIDER_SITE_OTHER): Payer: Medicare Other | Admitting: General Surgery

## 2013-09-09 ENCOUNTER — Telehealth: Payer: Self-pay | Admitting: *Deleted

## 2013-09-09 ENCOUNTER — Telehealth (INDEPENDENT_AMBULATORY_CARE_PROVIDER_SITE_OTHER): Payer: Self-pay | Admitting: General Surgery

## 2013-09-09 VITALS — BP 128/70 | HR 72 | Temp 98.0°F | Resp 14 | Ht 65.5 in | Wt 148.6 lb

## 2013-09-09 DIAGNOSIS — C210 Malignant neoplasm of anus, unspecified: Secondary | ICD-10-CM | POA: Diagnosis not present

## 2013-09-09 LAB — CBC WITH DIFFERENTIAL/PLATELET
Basophils Absolute: 0 10*3/uL (ref 0.0–0.1)
Basophils Relative: 0 % (ref 0–1)
Eosinophils Absolute: 0.1 10*3/uL (ref 0.0–0.7)
Eosinophils Relative: 1 % (ref 0–5)
HCT: 37.5 % (ref 36.0–46.0)
MCHC: 34.9 g/dL (ref 30.0–36.0)
MCV: 91.5 fL (ref 78.0–100.0)
Monocytes Absolute: 1.1 10*3/uL — ABNORMAL HIGH (ref 0.1–1.0)
Platelets: 354 10*3/uL (ref 150–400)
RDW: 12.9 % (ref 11.5–15.5)

## 2013-09-09 MED ORDER — LIDOCAINE 5 % EX OINT: TOPICAL_OINTMENT | CUTANEOUS | Status: DC | PRN

## 2013-09-09 NOTE — Telephone Encounter (Signed)
Attempted to reach patient without success.

## 2013-09-09 NOTE — Telephone Encounter (Signed)
Spoke with pt and informed her that we have scheduled her for her PET and CT scans at [facility] radiology on 09/28/13.  Explained that she must arrive ar 8:45 and she must be NPO after midnight.  Explained that she will need to take her contrast with her and that the hospital will instruct her when to consume it after the PET scan and before the CT scans.  I also informed her that the cancer center will contact her directly to set up her appt's with med onc and rad onc.

## 2013-09-09 NOTE — Progress Notes (Signed)
Chief Complaint  Patient presents with  . New Evaluation    eval anal squamous cell     HISTORY: Cindy [name] is a 79 y.o. female who presents to the office with 6 months of progressive anal pain and occasional bleeding.  She was noticing the anal was getting smaller and she was having trouble passing BM's.  A biopsy was performed on 9/10 by Dr Madilyn Fireman that showed SCC. Colonoscopy was last performed in 2006.  She is not on anything for constipation.   She is having reg BM's everyday but they are painful.  She has had about a 10 lb weight loss in the last 6 months.  She complains of excessive fatigue as well.  Past Medical History  Diagnosis Date  . PONV (postoperative nausea and vomiting)   . Hypertension   . Hyperlipemia   . Hypothyroidism   . GERD (gastroesophageal reflux disease)   . Arthritis   . Depression   . Deaf, left   . Cancer     skin      Past Surgical History  Procedure Laterality Date  . Abdominal hysterectomy    . Cholecystectomy    . Tonsillectomy    . Appendectomy    . Thyroidectomy, partial    . Ercp    . Colonoscopy    . Eye surgery      cataracts  . Knee arthroscopy  05/11/2012    Procedure: ARTHROSCOPY KNEE;  Surgeon: Velna Ochs, MD;  Location: [facility];  Service: Orthopedics;  Laterality: Right;  left knee medial menisectomy and chondroplasty  . Esophagogastroduodenoscopy  10/20/2012    Procedure: ESOPHAGOGASTRODUODENOSCOPY (EGD);  Surgeon: Willis Modena, MD;  Location: [facility];  Service: Endoscopy;  Laterality: Left;  . Cesarean section      2      Current Outpatient Prescriptions  Medication Sig Dispense Refill  . amLODipine (NORVASC) 5 MG tablet Take 5 mg by mouth every morning.       . cholecalciferol (VITAMIN D) 1000 UNITS tablet Take 1,000 Units by mouth daily.      Marland Kitchen esomeprazole (NEXIUM) 40 MG capsule Take 40 mg by mouth at bedtime.      Marland Kitchen estrogens, conjugated, (PREMARIN) 0.625 MG tablet Take 0.625 mg by mouth  daily.       Marland Kitchen FLUoxetine (PROZAC) 40 MG capsule Take 40 mg by mouth every morning.       Marland Kitchen levothyroxine (SYNTHROID, LEVOTHROID) 100 MCG tablet Take 100 mcg by mouth every morning.       Marland Kitchen OVER THE COUNTER MEDICATION       . telmisartan (MICARDIS) 80 MG tablet Take 80 mg by mouth every morning.       . trolamine salicylate (ASPERCREME) 10 % cream Apply 1 application topically as needed (pain).      Marland Kitchen zolpidem (AMBIEN) 10 MG tablet Take 10 mg by mouth at bedtime as needed for sleep.      Marland Kitchen lidocaine (XYLOCAINE) 5 % ointment Apply topically as needed.  35.44 g  3   No current facility-administered medications for this visit.      Allergies  Allergen Reactions  . Morphine And Related Hives      Family History  Problem Relation Age of Onset  . Kidney disease Mother   . Heart attack Father   . Heart disease Father   . Cancer Sister     breast  . Cancer Daughter     breast  . Cancer  Sister     ovarian  . Cancer Sister     melanoma      History   Social History  . Marital Status: Widowed    Spouse Name: N/A    Number of Children: N/A  . Years of Education: N/A   Social History Main Topics  . Smoking status: Never Smoker   . Smokeless tobacco: Never Used  . Alcohol Use: No  . Drug Use: No  . Sexual Activity: No   Other Topics Concern  . None   Social History Narrative  . None       REVIEW OF SYSTEMS - PERTINENT POSITIVES ONLY: Review of Systems - General ROS: positive for  - weight loss and about 10lbs over 6 mo negative for - chills or fever Hematological and Lymphatic ROS: negative for - bleeding problems or blood clots Respiratory ROS: no cough, shortness of breath, or wheezing Cardiovascular ROS: no chest pain or dyspnea on exertion Gastrointestinal ROS: pos for abdominal pain, change in bowel habits, and bloody stools Genito-Urinary ROS: no dysuria, trouble voiding, or hematuria  EXAM: Filed Vitals:   09/09/13 0945  BP: 128/70  Pulse: 72  Temp: 98 F  (36.7 C)  Resp: 14     Gen:  No acute distress.  Well nourished and well groomed.   Neurological: Alert and oriented to person, place, and time. Coordination normal.  Head: Normocephalic and atraumatic.  Eyes: Conjunctivae are normal. Pupils are equal, round, and reactive to light. No scleral icterus.  Neck: Normal range of motion. Neck supple. No tracheal deviation or thyromegaly present.  No cervical lymphadenopathy. Cardiovascular: Normal rate, regular rhythm, normal heart sounds and intact distal pulses.  Exam reveals no gallop and no friction rub.  No murmur heard. Respiratory: Effort normal.  No respiratory distress. No chest wall tenderness. Breath sounds normal.  No wheezes, rales or rhonchi.  GI: Soft.The abdomen is soft and nontender.  There is no rebound and no guarding. I feel some shoddy inguinal lymph nodes Musculoskeletal: Normal range of motion. Extremities are nontender.  Skin: Skin is warm and dry. No rash noted. No diaphoresis. No erythema. No pallor. No clubbing, cyanosis, or edema.   Psychiatric: Normal mood and affect. Behavior is normal. Judgment and thought content normal.   Anorectal Exam: R lateral anal canal mass     ASSESSMENT AND PLAN: Cindy [name] is a 79 y.o. F with a new diagnosis of invasive anal cancer.  This was confirmed on biopsy by Dr Madilyn Fireman.  On exam the mass is on the R lateral portion of her upper anal canal.  I will refer her to Oncology and Radiation Oncology for evaluation for treatment.  I have ordered a CT of her Chest, Abd and Pelvis, as well as a PET scan.  I will get a CBC to evaluate her for anemia, given her excessive fatigue lately and blood per rectum.  I have ordered her some lidocaine cream to use for pain.       Vanita Panda, MD Colon and Rectal Surgery / General Surgery [facility]      Visit Diagnoses: 1. Anal cancer     Primary Care Physician: Gaye Alken, MD

## 2013-09-09 NOTE — Patient Instructions (Signed)
Anal Cancer  What is anal cancer? Cancer describes a set of diseases in which normal cells in the body, through a series of genetic changes, become abnormal and lose the ability to control their growth. As cancers - also known as "malignancies" - grow, they invade the tissues around them (local invasion). They may also spread to other locations in the body via the blood vessels or lymphatic channels where they may implant and grow (metastases). The anus or anal canal is the passage that connects the rectum, or last part of the large intestine, to the outside of the body. Anal cancer arises from the cells around the anal opening or in the anal canal just inside the anal opening. Anal cancer is often a type of cancer called "squamous cell carcinoma". Other rare types of cancer may also occur in the anal canal and these require consultation with your physician or surgeon to determine the appropriate evaluation and treatment.  Cells that are becoming malignant or "premalignant", but have not invaded deeper into the skin, are referred to as "high-grade anal intraepithelial neoplasia" or HGAIN (previously referred to by a number of different terms, including "high grade dysplasia", "carcinoma-in-situ", "anal intra-epithelial neoplasia grade III", "high-grade squamous intraepithelial lesion", or "Bowen's disease"). While this condition is likely a precursor to anal cancer, this is not anal cancer and is treated differently than anal cancer. Your physician or colon and rectal surgeon can help clarify the differences. How common is anal cancer? Anal cancer is fairly uncommon, and accounts for about 1-2% of cancers affecting the intestinal tract. Approximately one in 600 men and women will get anal cancer in their lifetime (this can be compared to 1 in 20 men and women who will developed colon and rectal cancer in their lifetime). Almost 6,000 new cases of anal cancer are now diagnosed each year in the [place] with about  2/3rds of the cases in women. Approximately 800 people will die of the disease each year. Unlike some cancers, the numbers of patients that develop anal cancer each year is slowly increasing, especially in some higher risk groups (see below).  Who is at risk? A risk factor is something that increases a person's chance of getting a disease. Anal cancer is commonly associated with infection with the human papilloma virus (HPV), but some anal cancers develop without this infection being present. HPV can also cause warts in and around the anus as well as genital warts (on the penis in men and the vagina or cervix in women), but warts do not have to be present for anal cancer to develop. HPV is also associated with an increased risk of cervical and vaginal or vulvar cancer in women, penile cancer in men, as well as with some head and neck cancers in men and women. Having some of these cancers, especially cervical or vulvar cancer (or even pre-cancerous change in the cervix or vulva), can put people at increased risk for anal cancer - likely from the association with HPV infection.  Additional risk factors for anal cancer include: Age - While most of the cases of anal cancer develop in people over age 55, 1/3rd of the cases occur in patients that are younger than that.  Anal sex - People participating in anal sex, both men and women, are at increased risk.  Sexually transmitted diseases - Patients with multiple sex partners are at higher risk of getting sexually transmitted diseases like HPV and HIV and are, therefore, at an increased risk of developing anal   cancer.  Smoking - Harmful chemicals from smoking increases the risk of most cancers, including anal cancer.  Immunosuppression - People with weakened immune systems, such as transplant patients who must take drugs to suppress their immune systems and patients with HIV infection, are at higher risk.  Chronic local inflammation - People with long-standing anal  fistulas or open wounds in the anal area are at a slightly higher risk.  Pelvic radiation - People who have had pelvic radiation therapy for rectal, prostate, bladder or cervical cancer are at increased risk. Can anal cancer be prevented? Few cancers can be totally prevented, but the risk of developing anal cancer may be decreased significantly by avoiding the risk factors listed above and by getting regular checkups. Avoiding anal sex and infection with HPV and HIV can reduce the risk of developing anal cancer. Using condoms whenever having any kind of intercourse may reduce, but not eliminate, the risk of HPV infection. Smoking cessation lowers the risk of many types of cancer, including anal cancer. Vaccines against infection with certain types of HPV, especially in high-risk patients (see risk factors listed above), may also decrease the risk of developing anal cancer (in men and women). People who are at increased risk for anal cancer based on the risk factors listed above should talk to their doctors about consideration for anal cancer screening. This can include anal cytology or Pap tests (much like the Pap tests women undergo for cervical cancer screening). Early identification and treatment of premalignant lesions in the anus may also prevent the development of anal cancer.  What are the symptoms of anal cancer? Although 20% of anal cancers may be asymptomatic, many cases of anal cancer can be found early because they form in a part of the digestive tract that a doctor can see and reach easily. Anal cancers may cause symptoms such as:  Bleeding from the rectum or anus  The feeling of a lump or mass at the anal opening  Persistent or recurring pain in the anal area  Persistent or recurrent itching  Change in bowel habits (having more or fewer bowel movements) or increased straining during a bowel movement  Narrowing of the stools  Discharge or drainage (mucous or pus) from the anus  Swollen  lymph nodes (glands) in the anal or groin areas These symptoms can also be caused by less serious conditions such as hemorrhoids, but you should never assume this. If you have any of these symptoms, see your doctor or colon and rectal surgeon. How is anal cancer diagnosed? Anal cancer is usually found on examination of the anal canal because of the presence of symptoms listed above, on routine yearly physical exams by a physician (rectal exam for prostate check or at the time of a pelvic exam), or on screening tests such as those recommended for preventing or diagnosing colorectal cancer (for example: colonoscopy or lighted scope exam of the colon and rectum or yearly stool blood tests). Anoscopy, or examination of the anal canal with a small, lighted scope, may be performed as well to assess any abnormal findings. If an abnormal area in the anal canal is identified based on the doctor's exam, a biopsy will be performed to determine the diagnosis. If the diagnosis of anal cancer is confirmed, additional tests to determine the extent of the cancer may be recommended, which may include ultrasounds, Xrays, CT scans, and/or PET scans.  How are anal cancers treated? Treatment for most cases of anal cancer is very effective in   curing the cancer. There are 3 basic types of treatment used for anal cancer:  Surgery - an operation to remove the cancer  Radiation therapy - high-dose x-rays to kill cancer cells  Chemotherapy - giving drugs to kill cancer cells Combination therapy including radiation therapy and chemotherapy is now considered the standard treatment for most anal cancers. Occasionally, a very small or early tumor may be removed surgically (local excision) without the need for further treatment and with minimal damage to the anal sphincter muscles that are important for bowel control. On occasion, more major surgery to remove the anal cancer is needed, and this requires the creation of a colostomy where  the bowel is brought out to the skin on the belly wall where a bag is attached to collect the fecal matter. Will I need a colostomy? The majority of patients treated for anal cancer will not need a colostomy. If the tumor does not respond completely to combination therapy, if it recurs after treatment, or if it is an unusual type of anal cancer, removal of the rectum and anus and creation of a permanent colostomy may be necessary. This operation is known as an abdominoperineal resection (APR). What happens after treatment for anal cancer? Follow-up care to assess the results of treatment and to check for recurrence is very important. Most anal cancers are cured with combination therapy and/or surgery, so you should report any symptoms or problems to your doctor or surgeon right away. In addition, many tumors that recur may be successfully treated with surgery if they are caught early. A careful examination by an experienced physician or colon and rectal surgeon at regular intervals is the most important method of follow-up. Additional studies, such as certain types of scans (for example, CT or MRI) or ultrasounds, may also be recommended.  Conclusion Anal cancers are unusual tumors arising from the skin or lining of the anal canal. As with most cancers, early detection is associated with excellent survival. Most tumors are well-treated with combination chemotherapy and radiation. Recurrences, treatment failures, and advanced disease may require surgery. Follow the recommended screening examinations for anal and colorectal cancer and consult your doctor or colon and rectal surgeon early when any concerning symptoms occur.  What is a colon and rectal surgeon? Colon and rectal surgeons are experts in the surgical and non-surgical treatment of diseases of the colon, rectum and anus. They have completed advanced surgical training in the treatment of these diseases as well as full general surgical training.  Board-certified colon and rectal surgeons complete residencies in general surgery and colon and rectal surgery, and pass intensive examinations conducted by the American Board of Surgery and the American Board of Colon and Rectal Surgery. They are well-versed in the treatment of both benign and malignant diseases of the colon, rectum and anus and are able to perform routine screening examinations and surgically treat conditions if indicated to do so. author: Paul E. Wise, MD, FACS, FASCRS, on behalf of the [employer]CR[employer] Public Relations Committee   

## 2013-09-12 ENCOUNTER — Telehealth (INDEPENDENT_AMBULATORY_CARE_PROVIDER_SITE_OTHER): Payer: Self-pay

## 2013-09-12 ENCOUNTER — Encounter: Payer: Self-pay | Admitting: Radiation Oncology

## 2013-09-12 ENCOUNTER — Telehealth: Payer: Self-pay | Admitting: *Deleted

## 2013-09-12 NOTE — Progress Notes (Signed)
GU Location of Tumor / Histology : Colon l Cancer-anal rectal mass   Patient presentedmonths ago with signs/symptoms of: 6 months progressive anal pain and bleeding  Biopsies of(if applicable) revealed:08/31/13:Colon, biopsy, anal rectal mass- INVASIVE SQUAMOUS CELL CARCINOMA Dr. Madilyn Fireman Past/Anticipated interventions by urology, if any: CT abd/chest,pelvis and PET scan ordered  09/28/13 by Douglass Rivers  Past/Anticipated interventions by medical oncology, if any: no  Weight changes, if any: 10 lb wt.loss past 6 months  Bowel/Bladder complaints, if WUJ:WJXBJYN, bloody stools, change in bowel movementspain with every bowel movements pressure and shooting pains moderate,resolves quickly  Nausea/Vomiting, if any: yes,  Poor appetite,   Pain issues, if any: painful bowel movements daily,  SAFETY ISSUES: fatigued,nausea,   Prior radiation? no  Pacemaker/ICD? no  Possible current pregnancy? no  Is the patient on methotrexate? no  Current Complaints / other details:  Excessive fatigue,diverticulosis, Widowed, 4 children, poor appetite, had bowl rice and coffee for breakfast, lunch an ince cream sandwich and water

## 2013-09-12 NOTE — Telephone Encounter (Signed)
Message copied by Ivory Broad on Mon Sep 12, 2013  4:20 PM ------      Message from: Rise Paganini      Created: Mon Sep 12, 2013 12:25 PM      Regarding: Glenna Durand: [phone]       Patient's daughter called and stated that her mother is in horrific pain. She would like to know why her mother has to wait so long to have a scan. This particular daughter wasn't listed on the PHI. Her number is [phone]. Thank you. ------

## 2013-09-12 NOTE — Telephone Encounter (Signed)
I called the pt and spoke to her, not her daughter.  She is having quite a bit of rectal pain.  She would like her scans moved up sooner.  She is seeing the radiation oncologist Wednesday.  I called radiology and moved her appointments for scans up to 10/3.  She must arrive at 12:45 and not eat 6 hrs prior.  I called the pt with this information and she is appreciative for my help.

## 2013-09-12 NOTE — Telephone Encounter (Signed)
Spoke with patient by phone and gave appointments for Dr. Mitzi Hansen and Dr. Truett Perna.  Explained role of navigator.  Provided phone numbers and names of contacts.  She appreciated the call.

## 2013-09-13 ENCOUNTER — Telehealth: Payer: Self-pay | Admitting: Oncology

## 2013-09-13 NOTE — Telephone Encounter (Signed)
PT SCHEDULED PER GINA 09/29 @ 3:30 W/DR. SHERRILL WELCOME PACKET MAILED

## 2013-09-14 ENCOUNTER — Encounter: Payer: Self-pay | Admitting: Radiation Oncology

## 2013-09-14 ENCOUNTER — Ambulatory Visit
Admission: RE | Admit: 2013-09-14 | Discharge: 2013-09-14 | Disposition: A | Discharge status: Home / Self Care | Payer: Medicare Other | Source: Ambulatory Visit | Attending: Radiation Oncology | Admitting: Radiation Oncology

## 2013-09-14 VITALS — BP 145/50 | HR 60 | Temp 97.5°F | Resp 20 | Ht 65.0 in | Wt 151.6 lb

## 2013-09-14 DIAGNOSIS — I10 Essential (primary) hypertension: Secondary | ICD-10-CM | POA: Insufficient documentation

## 2013-09-14 DIAGNOSIS — C210 Malignant neoplasm of anus, unspecified: Secondary | ICD-10-CM

## 2013-09-14 DIAGNOSIS — Z79899 Other long term (current) drug therapy: Secondary | ICD-10-CM | POA: Diagnosis not present

## 2013-09-14 DIAGNOSIS — K219 Gastro-esophageal reflux disease without esophagitis: Secondary | ICD-10-CM | POA: Insufficient documentation

## 2013-09-14 DIAGNOSIS — E039 Hypothyroidism, unspecified: Secondary | ICD-10-CM | POA: Insufficient documentation

## 2013-09-14 HISTORY — DX: Malignant neoplasm of colon, unspecified: C18.9

## 2013-09-14 HISTORY — DX: Unspecified malignant neoplasm of skin, unspecified: C44.90

## 2013-09-14 NOTE — Progress Notes (Signed)
Please see the Nurse Progress Note in the MD Initial Consult Encounter for this patient. 

## 2013-09-15 ENCOUNTER — Telehealth: Payer: Self-pay | Admitting: *Deleted

## 2013-09-15 NOTE — Progress Notes (Signed)
Radiation Oncology         [phone] ________________________________  Name: Cindy [name] MRN: [id number]  Date: 09/14/2013  DOB: 1930-12-21  MV:HQIONG,EXBMWUXLK STEWART, MD  Romie Levee, MD   G. Rolm Baptise, M.D.  REFERRING PHYSICIAN: Romie Levee, MD   DIAGNOSIS: The encounter diagnosis was Anal cancer.   HISTORY OF PRESENT ILLNESS::Cindy [name] is a 79 y.o. female who is seen for an initial consultation visit. The patient describes a history of a sore rectum for 6 months. The patient describes some alternating diarrhea and constipation which is managed with diet including things like prune juice. She feels that she has suffered from some fatigue for approximately one year. She describes a history of some rectal bleeding as far back as October of 2013 but really has not had any substantial bleeding since then.  Evaluation of the patient has revealed a rectal mass within the right lateral rectum or dominant leg. A biopsy was performed and the pathology revealed invasive squamous cell carcinoma.  CT imaging for staging purposes are pending and will be completed on 09/23/2013.. A PET scan has also been ordered for this date.   PREVIOUS RADIATION THERAPY: No   PAST MEDICAL HISTORY:  has a past medical history of PONV (postoperative nausea and vomiting); Hypertension; Hyperlipemia; Hypothyroidism; GERD (gastroesophageal reflux disease); Arthritis; Depression; Deaf, left; Cancer; Colon cancer (08/31/13); and Skin cancer.     PAST SURGICAL HISTORY: Past Surgical History  Procedure Laterality Date  . Abdominal hysterectomy    . Cholecystectomy    . Tonsillectomy    . Appendectomy    . Thyroidectomy, partial    . Ercp    . Colonoscopy  08/31/13    invasive squamous cell colon  . Eye surgery      cataracts  . Knee arthroscopy  05/11/2012    Procedure: ARTHROSCOPY KNEE;  Surgeon: Velna Ochs, MD;  Location: [facility];  Service: Orthopedics;   Laterality: Right;  left knee medial menisectomy and chondroplasty  . Esophagogastroduodenoscopy  10/20/2012    Procedure: ESOPHAGOGASTRODUODENOSCOPY (EGD);  Surgeon: Willis Modena, MD;  Location: [facility];  Service: Endoscopy;  Laterality: Left;  . Cesarean section      2     FAMILY HISTORY: family history includes Cancer in her daughter, sister, sister, and sister; Heart attack in her father; Heart disease in her father; Kidney disease in her mother.   SOCIAL HISTORY:  reports that she has never smoked. She has never used smokeless tobacco. She reports that she does not drink alcohol or use illicit drugs.   ALLERGIES: Morphine and related   MEDICATIONS:  Current Outpatient Prescriptions  Medication Sig Dispense Refill  . amLODipine (NORVASC) 5 MG tablet Take 5 mg by mouth every morning.       . cholecalciferol (VITAMIN D) 1000 UNITS tablet Take 1,000 Units by mouth daily.      Marland Kitchen esomeprazole (NEXIUM) 40 MG capsule Take 40 mg by mouth at bedtime.      Marland Kitchen estrogens, conjugated, (PREMARIN) 0.625 MG tablet Take 0.625 mg by mouth daily.       Marland Kitchen FLUoxetine (PROZAC) 40 MG capsule Take 40 mg by mouth every morning.       Marland Kitchen levothyroxine (SYNTHROID, LEVOTHROID) 100 MCG tablet Take 100 mcg by mouth every morning.       . lidocaine (XYLOCAINE) 5 % ointment Apply topically as needed.  35.44 g  3  . OVER THE COUNTER MEDICATION Take 2 capsules by mouth  daily.       . telmisartan (MICARDIS) 80 MG tablet Take 80 mg by mouth every morning.       . trolamine salicylate (ASPERCREME) 10 % cream Apply 1 application topically as needed (pain).      Marland Kitchen zolpidem (AMBIEN) 10 MG tablet Take 10 mg by mouth at bedtime as needed for sleep.       No current facility-administered medications for this encounter.     REVIEW OF SYSTEMS:  A 15 point review of systems is documented in the electronic medical record. This was obtained by the nursing staff. However, I reviewed this with the patient to discuss  relevant findings and make appropriate changes.  Pertinent items are noted in HPI.    PHYSICAL EXAM:  height is 5\' 5"  (1.651 m) and weight is 151 lb 9.6 oz (68.765 kg). Her oral temperature is 97.5 F (36.4 C). Her blood pressure is 145/50 and her pulse is 60. Her respiration is 20.   General: Well-developed, in no acute distress HEENT: Normocephalic, atraumatic; oral cavity clear Neck: Supple without any lymphadenopathy Cardiovascular: Regular rate and rhythm Respiratory: Clear to auscultation bilaterally GI: Soft, nontender, normal bowel sounds Extremities: No edema present Neuro: No focal deficits Rectal exam:  No external lesions in the perianal region. A firm mass is present within the anal canal on the right laterally. This extends for approximately 2-3 cm. No blood on exam glove.  LABORATORY DATA:  Lab Results  Component Value Date   WBC 12.4* 09/09/2013   HGB 13.1 09/09/2013   HCT 37.5 09/09/2013   MCV 91.5 09/09/2013   PLT 354 09/09/2013   Lab Results  Component Value Date   NA 132* 03/18/2013   K 3.9 03/18/2013   CL 97 03/18/2013   CO2 24 03/18/2013   Lab Results  Component Value Date   ALT 25 03/17/2013   AST 35 03/17/2013   ALKPHOS 54 03/17/2013   BILITOT 0.5 03/17/2013      RADIOGRAPHY: No results found.     IMPRESSION: The patient has a diagnosis of squamous cell carcinoma of the anus. Staging studies at this time reveal a likely T2 tumor with no known regional or distant disease at this time. Further workup includes a CT scan and PET scan which are pending and these will help clarify the patient's clinical staging.  Notable aspects of the case include rectal soreness, alternating constipation/diarrhea, and the patient's age at 79 years.  Based on the current information available, the patient appears to be an appropriate candidate for definitve chemoradiotherapy. I therefore discussed a potential 5 1/2 - 6 week course of radiation with the patient. We discussed the  rationale of this treatment, including the expected potential benefit. We also discussed the potential side effects and risks of such a treatment as well. All of her questions were answered.   PLAN: Simulation in the near future such that we can proceed with treatment planning. At this time, I anticipate beginning the patient's treatment on 10/03/2013.     I spent 60 minutes face to face with the patient and more than 50% of that time was spent in counseling and/or coordination of care.    ________________________________   Radene Gunning, MD, PhD

## 2013-09-15 NOTE — Telephone Encounter (Signed)
Patient called asking if her CT sim had been scheduled as yet"Dr.Moody told me to call if I haven't been called ", checked no appt has been made as yet, Informed her I would e-mail MD and normally the CT therapist will call when that appt  is scheduled, she should be hearing from someone this next week, patient stated okay,thanks, reassured her she would be contacted soon 4:09 PM  4:09 PM

## 2013-09-19 ENCOUNTER — Ambulatory Visit (HOSPITAL_BASED_OUTPATIENT_CLINIC_OR_DEPARTMENT_OTHER): Payer: Medicare Other | Admitting: Oncology

## 2013-09-19 ENCOUNTER — Ambulatory Visit: Payer: Medicare Other

## 2013-09-19 VITALS — BP 134/67 | HR 62 | Temp 97.5°F | Resp 18 | Ht 65.0 in | Wt 149.5 lb

## 2013-09-19 DIAGNOSIS — K625 Hemorrhage of anus and rectum: Secondary | ICD-10-CM

## 2013-09-19 DIAGNOSIS — K6289 Other specified diseases of anus and rectum: Secondary | ICD-10-CM | POA: Diagnosis not present

## 2013-09-19 DIAGNOSIS — C210 Malignant neoplasm of anus, unspecified: Secondary | ICD-10-CM

## 2013-09-19 MED ORDER — TRAMADOL HCL 50 MG PO TABS: 50.0000 mg | ORAL_TABLET | Freq: Four times a day (QID) | ORAL | Status: DC | PRN

## 2013-09-19 MED ORDER — INFLUENZA VAC SPLIT QUAD 0.5 ML IM SUSP: 0.5000 mL | Freq: Once | INTRAMUSCULAR | Status: DC

## 2013-09-19 MED ORDER — ONDANSETRON HCL 4 MG PO TABS: 4.0000 mg | ORAL_TABLET | Freq: Three times a day (TID) | ORAL | Status: DC | PRN

## 2013-09-19 NOTE — Progress Notes (Signed)
[facility] New Patient Consult   Referring MD: Santanna Whitford 79 y.o.  [birth year]    Reason for Referral: Anal cancer     HPI: She was referred to Dr. Madilyn Fireman for evaluation of rectal pain and intermittent bleeding. She was taken to a sigmoidoscopy procedure on 08/31/2013. An ulcerated mass was found at the anus and in the rectum from 1-4 cm proximal to the anus the the mass was biopsied. The pathology [phone]) confirmed an invasive squamous cell carcinoma.  She was referred to Dr. Mitzi Hansen and is undergoing radiation treatment planning. She is scheduled for a staging PET scan later this week. She was seen by Dr. Maisie Fus on 09/09/2013 and a right lateral anal canal mass was noted.  She was admitted in March of this year with diarrhea and was diagnosed with "colitis ".    Past Medical History  Diagnosis Date  .  G4 P4    . Hypertension   . Hyperlipemia   . Hypothyroidism   . GERD (gastroesophageal reflux disease)   . Arthritis   . Depression   . Deaf, left   . Cancer     skin-neck   .  anal cancer 08/31/13    invasive squamous cell        Past Surgical History  Procedure Laterality Date  . Abdominal hysterectomy    . Cholecystectomy    . Tonsillectomy    . Appendectomy    . Thyroidectomy, partial    . Ercp    . Colonoscopy  08/31/13    invasive squamous cell colon  . Eye surgery      cataracts  . Knee arthroscopy  05/11/2012    Procedure: ARTHROSCOPY KNEE;  Surgeon: Velna Ochs, MD;  Location: [facility];  Service: Orthopedics;  Laterality: Right;  left knee medial menisectomy and chondroplasty  . Esophagogastroduodenoscopy  10/20/2012    Procedure: ESOPHAGOGASTRODUODENOSCOPY (EGD);  Surgeon: Willis Modena, MD;  Location: [facility];  Service: Endoscopy;  Laterality: Left;  . Cesarean section      2    .  Bladder suspension surgery  Family History  Problem Relation Age of Onset  . Kidney disease Mother    . Heart attack Father   . Heart disease Father   . Cancer Sister     breast  . Cancer Daughter     breast  . Cancer Sister      gastric   . Cancer Sister     melanoma    Current outpatient prescriptions:amLODipine (NORVASC) 5 MG tablet, Take 5 mg by mouth every morning. , Disp: , Rfl: ;  cholecalciferol (VITAMIN D) 1000 UNITS tablet, Take 1,000 Units by mouth daily., Disp: , Rfl: ;  esomeprazole (NEXIUM) 40 MG capsule, Take 40 mg by mouth at bedtime., Disp: , Rfl: ;  estrogens, conjugated, (PREMARIN) 0.625 MG tablet, Take 0.625 mg by mouth daily. , Disp: , Rfl:  FLUoxetine (PROZAC) 40 MG capsule, Take 40 mg by mouth every morning. , Disp: , Rfl: ;  levothyroxine (SYNTHROID, LEVOTHROID) 100 MCG tablet, Take 100 mcg by mouth every morning. , Disp: , Rfl: ;  lidocaine (XYLOCAINE) 5 % ointment, Apply topically as needed., Disp: 35.44 g, Rfl: 3;  ondansetron (ZOFRAN) 4 MG tablet, Take 1 tablet (4 mg total) by mouth every 8 (eight) hours as needed for nausea., Disp: 20 tablet, Rfl: 1 OVER THE COUNTER MEDICATION, Take 2 capsules by mouth daily. , Disp: , Rfl: ;  telmisartan (MICARDIS) 80 MG tablet, Take 80 mg by mouth every morning. , Disp: , Rfl: ;  traMADol (ULTRAM) 50 MG tablet, Take 1 tablet (50 mg total) by mouth every 6 (six) hours as needed for pain., Disp: 30 tablet, Rfl: 1;  trolamine salicylate (ASPERCREME) 10 % cream, Apply 1 application topically as needed (pain)., Disp: , Rfl:  zolpidem (AMBIEN) 10 MG tablet, Take 10 mg by mouth at bedtime as needed for sleep., Disp: , Rfl:  Current facility-administered medications:influenza vac split quadrivalent PF (FLUARIX) injection 0.5 mL, 0.5 mL, Intramuscular, Once, Ladene Artist, MD  Allergies:  Allergies  Allergen Reactions  . Morphine And Related Hives    Social History: She lives alone in [place]. She previously worked as a Diplomatic Services operational officer. She does not use tobacco or alcohol. She was transfused with childbirth. No risk factor for HIV or  hepatitis.    ROS:   Positives include: 10 pound weight loss, anorexia, solid dysphagia for the past few months, nausea for the past year, urinary frequency, chronic constipation and diarrhea, rectal pain, intermittent rectal bleeding  A complete ROS was otherwise negative.  Physical Exam:  Blood pressure 134/67, pulse 62, temperature 97.5 F (36.4 C), temperature source Oral, resp. rate 18, height 5\' 5"  (1.651 m), weight 149 lb 8 oz (67.813 kg).  HEENT: Upper denture plate, oropharynx without visible mass, neck without mass Lungs: Clear bilaterally Cardiac: Regular rate and rhythm Abdomen: No hepatosplenomegaly, nontender, no mass, at the right side of the anal verge there is a firm mass like area measuring 3-4 mm (she declined a complete rectal exam)  Vascular: No leg edema Lymph nodes: No cervical, supraclavicular, axillary, inguinal, or femoral nodes.? Firm right greater than left one to 2 cm nodes at the lateral aspect of the pubic region Neurologic: Alert and oriented, the motor exam appears intact in the upper and lower extremities Skin: Multiple benign appearing moles over the trunk   LAB:  CBC  Lab Results  Component Value Date   WBC 12.4* 09/09/2013   HGB 13.1 09/09/2013   HCT 37.5 09/09/2013   MCV 91.5 09/09/2013   PLT 354 09/09/2013     CMP      Component Value Date/Time   NA 132* 03/18/2013 0430   K 3.9 03/18/2013 0430   CL 97 03/18/2013 0430   CO2 24 03/18/2013 0430   GLUCOSE 103* 03/18/2013 0430   BUN 12 09/09/2013 1102   CREATININE 0.78 09/09/2013 1102   CREATININE 0.76 03/18/2013 0430   CALCIUM 8.8 03/18/2013 0430   PROT 7.8 03/17/2013 1215   ALBUMIN 3.6 03/17/2013 1215   AST 35 03/17/2013 1215   ALT 25 03/17/2013 1215   ALKPHOS 54 03/17/2013 1215   BILITOT 0.5 03/17/2013 1215   GFRNONAA 77* 03/18/2013 0430   GFRAA 89* 03/18/2013 0430     Radiology: Staging PET scan pending    Assessment/Plan:   1. Squamous cell carcinoma the anus 2.? Palpable  inguinal/pubic lymphadenopathy 3. Rectal Pain and bleeding secondary to #1   Disposition:   Ms. Knoche has been diagnosed with squamous cell carcinoma of the anus. I discussed the diagnosis and treatment options with the patient and her daughter. She is scheduled to undergo a staging PET scan later this week. I recommend concurrent chemotherapy and radiation if there is no evidence of distant metastatic disease.  We prescribed tramadol for pain and Zofran for nausea.  I described the 5-fluorouracil/mitomycin-C chemotherapy regimen. This will be given during the first and final  week of radiation. We reviewed the potential toxicities associated with this chemotherapy regimen including the chance for nausea/vomiting, mucositis, diarrhea, alopecia, and hematologic toxicity. We discussed the hemolytic uremic syndrome rarely seen with mitomycin-C. We reviewed the skin rash, hyperpigmentation, and hand/foot syndrome associated with 5-fluorouracil. She understands the potential for skin toxicity at the groin/perineum with chemotherapy and radiation.  She will be referred for placement of a PICC for the administration of chemotherapy. She will be scheduled for a baseline laboratory evaluation and chemotherapy teaching class within the next one week. The plan is to initiate concurrent chemotherapy and radiation on 10/03/2013.  Ms. Aybar will return for an office visit on 10/13/2013.  Approximately 60 minutes were spent with the patient today. The majority of the time was used for counseling and coordination of care.  Delvonte Berenson 09/19/2013, 5:42 PM

## 2013-09-19 NOTE — Progress Notes (Signed)
Met with [name] and family. Explained role of nurse navigator. Educational information provided on anal cancer. Referral made to dietician for diet education. [facility] resource sheet provided to patient, including SW service information.  Contact names and phone numbers were provided.  No barriers to care identified.  Will continue to follow as needed.

## 2013-09-20 ENCOUNTER — Other Ambulatory Visit: Payer: Self-pay | Admitting: Oncology

## 2013-09-20 ENCOUNTER — Telehealth: Payer: Self-pay | Admitting: *Deleted

## 2013-09-20 NOTE — Telephone Encounter (Signed)
Received phone call from patient stating that she is having second thoughts about treatment and is not sure that she even wants to go through chemo and XRT.  She stated she is not going to come tomorrow for simulation because she wants to wait and get the results of her PET scan first, scheduled on 09/23/13.  She stated she wants to make a decision after she knows the results and would like Dr. Truett Perna to call her with the results.  Dr. Truett Perna and Dr. Mitzi Hansen will be informed.  This RN asked the patient if she had any questions and she said "no, I just want to know what the PET shows.  I am not young and I am not sure about treatment".

## 2013-09-21 ENCOUNTER — Telehealth: Payer: Self-pay | Admitting: Oncology

## 2013-09-21 ENCOUNTER — Telehealth: Payer: Self-pay | Admitting: *Deleted

## 2013-09-21 ENCOUNTER — Ambulatory Visit: Admission: RE | Admit: 2013-09-21 | Payer: Medicare Other | Source: Ambulatory Visit | Admitting: Radiation Oncology

## 2013-09-21 NOTE — Telephone Encounter (Signed)
Per staff message and POF I have scheduled appts.  JMW  

## 2013-09-21 NOTE — Telephone Encounter (Signed)
x

## 2013-09-21 NOTE — Telephone Encounter (Signed)
S/w the pt's dtr and she is aware of the lab and the chemo educ class appt on 09/27/2013@9 :15am.; they are aware that they will receive their schedules for that week.

## 2013-09-22 ENCOUNTER — Telehealth: Payer: Self-pay | Admitting: *Deleted

## 2013-09-22 NOTE — Telephone Encounter (Signed)
Received phone call from patient stating that she still wants to wait and receive results from scans prior to deciding on treatment.  She is aware that Dr. Truett Perna plans to call her on 09/26/13 after results are known.  She would like Dr. Truett Perna to review everything with her on the phone.  This RN will make MD aware.  Patient also was concerned that if she decides on treatment, that her simulation appointment will need to be made.  This RN assured patient that Dr. Mitzi Hansen will be kept informed with results and will be notified by Dr. Truett Perna.  Patient stated she is "just wanting to make sure of everything before she makes a decision".  This RN gave emotional support and encouraged her to call if she has any more questions.

## 2013-09-23 ENCOUNTER — Encounter (HOSPITAL_COMMUNITY): Payer: Self-pay

## 2013-09-23 ENCOUNTER — Ambulatory Visit (HOSPITAL_COMMUNITY)
Admission: RE | Admit: 2013-09-23 | Discharge: 2013-09-23 | Disposition: A | Discharge status: Home / Self Care | Payer: Medicare Other | Source: Ambulatory Visit | Attending: General Surgery | Admitting: General Surgery

## 2013-09-23 ENCOUNTER — Encounter (HOSPITAL_COMMUNITY)
Admission: RE | Admit: 2013-09-23 | Discharge: 2013-09-23 | Disposition: A | Discharge status: Home / Self Care | Payer: Medicare Other | Source: Ambulatory Visit | Attending: General Surgery | Admitting: General Surgery

## 2013-09-23 DIAGNOSIS — K5730 Diverticulosis of large intestine without perforation or abscess without bleeding: Secondary | ICD-10-CM | POA: Diagnosis not present

## 2013-09-23 DIAGNOSIS — C210 Malignant neoplasm of anus, unspecified: Secondary | ICD-10-CM | POA: Insufficient documentation

## 2013-09-23 DIAGNOSIS — Q619 Cystic kidney disease, unspecified: Secondary | ICD-10-CM | POA: Insufficient documentation

## 2013-09-23 DIAGNOSIS — Z9089 Acquired absence of other organs: Secondary | ICD-10-CM | POA: Insufficient documentation

## 2013-09-23 DIAGNOSIS — I700 Atherosclerosis of aorta: Secondary | ICD-10-CM | POA: Insufficient documentation

## 2013-09-23 DIAGNOSIS — J984 Other disorders of lung: Secondary | ICD-10-CM | POA: Diagnosis not present

## 2013-09-23 DIAGNOSIS — R918 Other nonspecific abnormal finding of lung field: Secondary | ICD-10-CM | POA: Insufficient documentation

## 2013-09-23 DIAGNOSIS — Z90710 Acquired absence of both cervix and uterus: Secondary | ICD-10-CM | POA: Diagnosis not present

## 2013-09-23 DIAGNOSIS — C774 Secondary and unspecified malignant neoplasm of inguinal and lower limb lymph nodes: Secondary | ICD-10-CM | POA: Diagnosis not present

## 2013-09-23 LAB — GLUCOSE, CAPILLARY: Glucose-Capillary: 100 mg/dL — ABNORMAL HIGH (ref 70–99)

## 2013-09-23 MED ORDER — IOHEXOL 300 MG/ML  SOLN
100.0000 mL | Freq: Once | INTRAMUSCULAR | Status: AC | PRN
  Administered 2013-09-23: 100 mL via INTRAVENOUS

## 2013-09-23 MED ORDER — FLUDEOXYGLUCOSE F - 18 (FDG) INJECTION
18.6000 | Freq: Once | INTRAVENOUS | Status: AC | PRN
  Administered 2013-09-23: 18.6 via INTRAVENOUS

## 2013-09-27 ENCOUNTER — Other Ambulatory Visit: Payer: Medicare Other

## 2013-09-27 ENCOUNTER — Other Ambulatory Visit (HOSPITAL_BASED_OUTPATIENT_CLINIC_OR_DEPARTMENT_OTHER): Payer: Medicare Other | Admitting: Lab

## 2013-09-27 ENCOUNTER — Encounter: Payer: Self-pay | Admitting: *Deleted

## 2013-09-27 DIAGNOSIS — C210 Malignant neoplasm of anus, unspecified: Secondary | ICD-10-CM | POA: Diagnosis not present

## 2013-09-27 LAB — CBC WITH DIFFERENTIAL/PLATELET
BASO%: 0.9 % (ref 0.0–2.0)
EOS%: 2.1 % (ref 0.0–7.0)
Eosinophils Absolute: 0.2 10*3/uL (ref 0.0–0.5)
HGB: 12.8 g/dL (ref 11.6–15.9)
LYMPH%: 23.7 % (ref 14.0–49.7)
MCHC: 34.6 g/dL (ref 31.5–36.0)
MCV: 91.6 fL (ref 79.5–101.0)
MONO#: 0.5 10*3/uL (ref 0.1–0.9)
MONO%: 6.6 % (ref 0.0–14.0)
NEUT#: 5.3 10*3/uL (ref 1.5–6.5)
RBC: 4.03 10*6/uL (ref 3.70–5.45)
RDW: 11.9 % (ref 11.2–14.5)

## 2013-09-27 LAB — COMPREHENSIVE METABOLIC PANEL (CC13)
ALT: 11 U/L (ref 0–55)
AST: 15 U/L (ref 5–34)
Albumin: 3.5 g/dL (ref 3.5–5.0)
Alkaline Phosphatase: 66 U/L (ref 40–150)
BUN: 11.3 mg/dL (ref 7.0–26.0)
Potassium: 4.4 mEq/L (ref 3.5–5.1)
Sodium: 127 mEq/L — ABNORMAL LOW (ref 136–145)
Total Bilirubin: 0.55 mg/dL (ref 0.20–1.20)
Total Protein: 7.3 g/dL (ref 6.4–8.3)

## 2013-09-28 ENCOUNTER — Ambulatory Visit (HOSPITAL_COMMUNITY): Payer: Medicare Other

## 2013-09-28 ENCOUNTER — Telehealth: Payer: Self-pay | Admitting: *Deleted

## 2013-09-28 ENCOUNTER — Encounter (HOSPITAL_COMMUNITY): Payer: Medicare Other

## 2013-09-28 ENCOUNTER — Other Ambulatory Visit: Payer: Self-pay | Admitting: *Deleted

## 2013-09-28 ENCOUNTER — Ambulatory Visit
Admission: RE | Admit: 2013-09-28 | Discharge: 2013-09-28 | Disposition: A | Discharge status: Home / Self Care | Payer: Medicare Other | Source: Ambulatory Visit | Attending: Radiation Oncology | Admitting: Radiation Oncology

## 2013-09-28 DIAGNOSIS — D696 Thrombocytopenia, unspecified: Secondary | ICD-10-CM | POA: Insufficient documentation

## 2013-09-28 DIAGNOSIS — R110 Nausea: Secondary | ICD-10-CM | POA: Insufficient documentation

## 2013-09-28 DIAGNOSIS — C210 Malignant neoplasm of anus, unspecified: Secondary | ICD-10-CM

## 2013-09-28 DIAGNOSIS — D709 Neutropenia, unspecified: Secondary | ICD-10-CM | POA: Insufficient documentation

## 2013-09-28 DIAGNOSIS — K121 Other forms of stomatitis: Secondary | ICD-10-CM | POA: Insufficient documentation

## 2013-09-28 DIAGNOSIS — R197 Diarrhea, unspecified: Secondary | ICD-10-CM | POA: Insufficient documentation

## 2013-09-28 DIAGNOSIS — R5081 Fever presenting with conditions classified elsewhere: Secondary | ICD-10-CM | POA: Insufficient documentation

## 2013-09-28 DIAGNOSIS — Z79899 Other long term (current) drug therapy: Secondary | ICD-10-CM | POA: Insufficient documentation

## 2013-09-28 DIAGNOSIS — Z510 Encounter for antineoplastic radiation therapy: Secondary | ICD-10-CM | POA: Insufficient documentation

## 2013-09-28 MED ORDER — ONDANSETRON HCL 8 MG PO TABS: 8.0000 mg | ORAL_TABLET | Freq: Three times a day (TID) | ORAL | Status: DC | PRN

## 2013-09-28 NOTE — Telephone Encounter (Signed)
Received message from patient's daughter, Megan Salon re: her mother's care.  She wants to know if her mother can have a flue shot.  Per Dr. Truett Perna, "okay to have flu shot if she gets it this week". Attempted return call to patient's daughter at [phone] but was unable to reach her.  Dr. Kalman Drape nurse informed and will contact patient's daughter re: the above and re: schedule changes to start treatment on 10/10/13 instead of 10/03/13.

## 2013-09-28 NOTE — Progress Notes (Signed)
The patient was given Zofran for nausea. She has been taking 4 mg at a time. We will increase this to 8 mg. She is to let us if this does not alleviate the nausea.

## 2013-09-28 NOTE — Telephone Encounter (Signed)
Pt's daughter returned call. Informed her that Radiation and chemo appts are being rescheduled to begin 10/20. Informed her that Dr. Truett Perna says it's OK to have flu shot. Pt should get it this week. She stated they may take pt to a walk-in pharmacy for flu shot. Instructed her to call office if she needs pt to have vaccine in this office. Encouraged her to call office as questions arise.

## 2013-09-28 NOTE — Telephone Encounter (Signed)
Call from pt's daughter reporting she was instructed by Dr. Mitzi Hansen to increase Zofran to 8 mg. Pt has only #4 tabs left. Requesting refill on Zofran. Same done, per Dr. Truett Perna.

## 2013-09-28 NOTE — Telephone Encounter (Signed)
Per Dr. Truett Perna,  move the 10/03/13 PICC line and infusion to 10/10/13.  Move pump DC from 10/07/13 to 10/14/13.  Schedule office visit with Dr. Truett Perna on 10/10/13 at 9:45 prior to treatment and call patient.  POF sent to scheduler.

## 2013-09-29 ENCOUNTER — Telehealth: Payer: Self-pay | Admitting: *Deleted

## 2013-09-29 NOTE — Telephone Encounter (Signed)
Call from pt's daughter reporting Zofran is not helping for nausea. Taking 8 mg Q 8 hours. Per Megan Salon, pt is eating small amts. No vomiting. Daughter is afraid pt will not follow through with treatment due to feeling so nauseated before having any chemo. Asking for additional antiemetic.

## 2013-09-30 ENCOUNTER — Telehealth: Payer: Self-pay | Admitting: *Deleted

## 2013-09-30 ENCOUNTER — Encounter: Payer: Self-pay | Admitting: *Deleted

## 2013-09-30 ENCOUNTER — Telehealth: Payer: Self-pay | Admitting: Oncology

## 2013-09-30 MED ORDER — PROCHLORPERAZINE MALEATE 5 MG PO TABS: 5.0000 mg | ORAL_TABLET | Freq: Three times a day (TID) | ORAL | Status: DC | PRN

## 2013-09-30 NOTE — Telephone Encounter (Signed)
Talked to pt and gave her appt for Md and chemo for 10/10/13

## 2013-09-30 NOTE — Telephone Encounter (Signed)
Per staff message I have moved appt from 10/13 to 10/20

## 2013-09-30 NOTE — Telephone Encounter (Signed)
Talked to Cindy [name] and she is aware of changes in her appts, emailed Marcelino Duster regarding change in chemo

## 2013-09-30 NOTE — Progress Notes (Signed)
[facility] Psychosocial Distress Screening Clinical Social Work  Clinical Social Work was referred by distress screening protocol.  The patient scored a 10 on the Psychosocial Distress Thermometer which indicates severe distress. Clinical Social Worker phoned Pt. to assess for distress and other psychosocial needs. Pt denies distress and shared her only concern was her nausea. She was waiting on follow up from the RN currently. Pt aware of our support team/programs, but denies concerns.    Clinical Social Worker follow up needed: no  Doreen Salvage, LCSW Clinical Social Worker [facility] for Patient & Family Support [facility] Wednesday, Thursday and Friday Phone: [phone] Fax: [phone]

## 2013-09-30 NOTE — Telephone Encounter (Signed)
Attempted to call pt, no answer. Left message on voicemail, additional antiemetic sent to pharmacy.

## 2013-10-03 ENCOUNTER — Other Ambulatory Visit: Payer: Self-pay | Admitting: *Deleted

## 2013-10-03 ENCOUNTER — Telehealth: Payer: Self-pay | Admitting: *Deleted

## 2013-10-03 ENCOUNTER — Ambulatory Visit: Payer: Medicare Other

## 2013-10-03 ENCOUNTER — Other Ambulatory Visit (HOSPITAL_COMMUNITY): Payer: Medicare Other

## 2013-10-03 NOTE — Telephone Encounter (Signed)
Called back and spoke with daughter: only drinking about 16 ounces fluid/day and eating small amounts. Concerned she is dehydrated. Nauseated despite taking Compazine 5 mg every 6 hours. Feels some of her nausea is due to anxiety and asking for xanax, feeling this could help her nausea. Also constipated with last BM on Friday. Has drank prune juice X 2 days without results. Stays in bed all day and getting weaker. Barely able to get to bathroom. Daughter reports patient lives alone, but she checks on her often, but is alone at night. Suggested to continue to push fluids, try stool softener and even suppository for BM. Will speak with MD.

## 2013-10-03 NOTE — Telephone Encounter (Signed)
Noted earlier call in with nurse Almira Coaster and Dr. Truett Perna. Called daughter and left VM to follow that recommendation: contact PCP.

## 2013-10-03 NOTE — Telephone Encounter (Signed)
Left VM concerned her mother is dehydrated. Nausea med not working and was confused over the weekend.

## 2013-10-03 NOTE — Telephone Encounter (Signed)
Patient's daughter phoned to say her mother, Cindy [name], is still having problems with nausea.  She is drinking some but is eating very little.  She stated she thinks it could be her mother's nerves.  She stated that her mother has been taking nausea meds as prescribed, but that it is not helping.  She stated this same thing happened last year and that is was her nerves.  This RN informed Dr. Truett Perna who stated that she should either take her mother to the emergency room or to her primary care doctor.  Per Dr. Truett Perna,  she has not been treated with chemo/XRT yet and  he is not sure why she is nauseated ,could be anxiety.  Patient's daughter verbalized comprehension and stated she would call her primary care doctor.  She stated she would call back if she is unable to get her mother to a doctor.  Dr. Truett Perna aware and okay with this plan.

## 2013-10-04 NOTE — Progress Notes (Addendum)
  Radiation Oncology         [phone] ________________________________  Name: Cindy [name] MRN: [id number]  Date: 09/28/2013  DOB: [birth year]  SIMULATION AND TREATMENT PLANNING NOTE   CONSENT VERIFIED: yes   SET UP: Patient is set-up supine   IMMOBILIZATION: The following immobilization is used: alpha-cradle. This complex treatment device will be used on a daily basis during the patient's treatment.   Diagnosis: Anal cancer   NARRATIVE: The patient was brought to the CT Simulation planning suite. Identity was confirmed. All relevant records and images related to the planned course of therapy were reviewed. Then, the patient was positioned in a stable reproducible clinical set-up for radiation therapy using an aquaplast mask. Skin markings were placed. The CT images were loaded into the planning software where the target and avoidance structures were contoured.The radiation prescription was entered and confirmed.   The patient will receive 54 Gy in 30 fractions to the high-dose target region.  Daily image guidance is ordered, and this will be used on a daily basis. This is necessary to ensure accurate and precise localization of the target in addition to accurate alignment of the normal tissue structures in this region. This is particularly important given the possible motion of the high-dose target.  Treatment planning then occurred.   I have requested : Intensity Modulated Radiotherapy (IMRT) is medically necessary for this case for the following reason: Dose homogeneity; the target is in close proximity to critical normal structures, including the femoral heads, bladder, and small bowel. IMRT is thus medically to appropriately treat the patient.   Special treatment procedure  The patient will receive chemotherapy during the course of radiation treatment. The patient may experience increased or overlapping toxicity due to this combined-modality approach and the patient will be  monitored for such problems. This may include extra lab work as necessary. This therefore constitutes a special treatment procedure.     ________________________________  Radene Gunning, MD, PhD

## 2013-10-06 DIAGNOSIS — C210 Malignant neoplasm of anus, unspecified: Secondary | ICD-10-CM | POA: Diagnosis not present

## 2013-10-10 ENCOUNTER — Ambulatory Visit (HOSPITAL_COMMUNITY)
Admission: RE | Admit: 2013-10-10 | Discharge: 2013-10-10 | Disposition: A | Discharge status: Home / Self Care | Payer: Medicare Other | Source: Ambulatory Visit | Attending: Oncology | Admitting: Oncology

## 2013-10-10 ENCOUNTER — Other Ambulatory Visit: Payer: Self-pay | Admitting: Medical Oncology

## 2013-10-10 ENCOUNTER — Telehealth: Payer: Self-pay | Admitting: Oncology

## 2013-10-10 ENCOUNTER — Ambulatory Visit (HOSPITAL_BASED_OUTPATIENT_CLINIC_OR_DEPARTMENT_OTHER): Payer: Medicare Other | Admitting: Oncology

## 2013-10-10 ENCOUNTER — Ambulatory Visit
Admission: RE | Admit: 2013-10-10 | Discharge: 2013-10-10 | Disposition: A | Discharge status: Home / Self Care | Payer: Medicare Other | Source: Ambulatory Visit | Attending: Radiation Oncology | Admitting: Radiation Oncology

## 2013-10-10 ENCOUNTER — Ambulatory Visit: Payer: Medicare Other | Admitting: Nurse Practitioner

## 2013-10-10 ENCOUNTER — Other Ambulatory Visit: Payer: Self-pay | Admitting: Oncology

## 2013-10-10 ENCOUNTER — Ambulatory Visit (HOSPITAL_BASED_OUTPATIENT_CLINIC_OR_DEPARTMENT_OTHER): Payer: Medicare Other

## 2013-10-10 VITALS — BP 109/54 | HR 68 | Temp 97.4°F | Resp 18 | Ht 65.0 in | Wt 148.9 lb

## 2013-10-10 DIAGNOSIS — Z23 Encounter for immunization: Secondary | ICD-10-CM | POA: Diagnosis not present

## 2013-10-10 DIAGNOSIS — R110 Nausea: Secondary | ICD-10-CM

## 2013-10-10 DIAGNOSIS — C210 Malignant neoplasm of anus, unspecified: Secondary | ICD-10-CM

## 2013-10-10 DIAGNOSIS — K625 Hemorrhage of anus and rectum: Secondary | ICD-10-CM | POA: Diagnosis not present

## 2013-10-10 DIAGNOSIS — Z5111 Encounter for antineoplastic chemotherapy: Secondary | ICD-10-CM

## 2013-10-10 DIAGNOSIS — R599 Enlarged lymph nodes, unspecified: Secondary | ICD-10-CM

## 2013-10-10 DIAGNOSIS — K6289 Other specified diseases of anus and rectum: Secondary | ICD-10-CM | POA: Diagnosis not present

## 2013-10-10 DIAGNOSIS — R198 Other specified symptoms and signs involving the digestive system and abdomen: Secondary | ICD-10-CM | POA: Diagnosis not present

## 2013-10-10 MED ORDER — SODIUM CHLORIDE 0.9 % IV SOLN
800.0000 mg/m2/d | INTRAVENOUS | Status: DC
  Administered 2013-10-10: 5650 mg via INTRAVENOUS
  Filled 2013-10-10: qty 113

## 2013-10-10 MED ORDER — SODIUM CHLORIDE 0.9 % IJ SOLN
10.0000 mL | INTRAMUSCULAR | Status: DC | PRN
  Filled 2013-10-10: qty 10

## 2013-10-10 MED ORDER — HYDROCODONE-ACETAMINOPHEN 5-325 MG PO TABS: 1.0000 | ORAL_TABLET | ORAL | Status: DC | PRN

## 2013-10-10 MED ORDER — ONDANSETRON 8 MG/50ML IVPB (CHCC)
8.0000 mg | Freq: Once | INTRAVENOUS | Status: AC
  Administered 2013-10-10: 8 mg via INTRAVENOUS

## 2013-10-10 MED ORDER — MITOMYCIN CHEMO IV INJECTION 20 MG
10.0000 mg/m2 | Freq: Once | INTRAVENOUS | Status: AC
  Administered 2013-10-10: 17.5 mg via INTRAVENOUS
  Filled 2013-10-10: qty 35

## 2013-10-10 MED ORDER — INFLUENZA VAC SPLIT QUAD 0.5 ML IM SUSP
0.5000 mL | Freq: Once | INTRAMUSCULAR | Status: AC
  Administered 2013-10-10: 0.5 mL via INTRAMUSCULAR
  Filled 2013-10-10: qty 0.5

## 2013-10-10 MED ORDER — SODIUM CHLORIDE 0.9 % IV SOLN
Freq: Once | INTRAVENOUS | Status: AC
  Administered 2013-10-10: 16:00:00 via INTRAVENOUS

## 2013-10-10 MED ORDER — HEPARIN SOD (PORK) LOCK FLUSH 100 UNIT/ML IV SOLN
500.0000 [IU] | Freq: Once | INTRAVENOUS | Status: DC | PRN
  Filled 2013-10-10: qty 5

## 2013-10-10 MED ORDER — LIDOCAINE HCL 1 % IJ SOLN
INTRAMUSCULAR | Status: AC
  Filled 2013-10-10: qty 20

## 2013-10-10 MED ORDER — HEPARIN SOD (PORK) LOCK FLUSH 100 UNIT/ML IV SOLN
INTRAVENOUS | Status: AC
  Administered 2013-10-10: 500 [IU]
  Filled 2013-10-10: qty 5

## 2013-10-10 MED ORDER — DEXAMETHASONE SODIUM PHOSPHATE 10 MG/ML IJ SOLN
10.0000 mg | Freq: Once | INTRAMUSCULAR | Status: AC
  Administered 2013-10-10: 10 mg via INTRAVENOUS

## 2013-10-10 NOTE — Telephone Encounter (Signed)
gv and printed appt sched and avs for pt for OCT. °

## 2013-10-10 NOTE — Patient Instructions (Signed)
[facility] Discharge Instructions for Patients Receiving Chemotherapy  Today you received the following chemotherapy agents Mitomycin and Adrucil.  To help prevent nausea and vomiting after your treatment, we encourage you to take your nausea medication.   If you develop nausea and vomiting that is not controlled by your nausea medication, call the clinic.   BELOW ARE SYMPTOMS THAT SHOULD BE REPORTED IMMEDIATELY:  *FEVER GREATER THAN 100.5 F  *CHILLS WITH OR WITHOUT FEVER  NAUSEA AND VOMITING THAT IS NOT CONTROLLED WITH YOUR NAUSEA MEDICATION  *UNUSUAL SHORTNESS OF BREATH  *UNUSUAL BRUISING OR BLEEDING  TENDERNESS IN MOUTH AND THROAT WITH OR WITHOUT PRESENCE OF ULCERS  *URINARY PROBLEMS  *BOWEL PROBLEMS  UNUSUAL RASH Items with * indicate a potential emergency and should be followed up as soon as possible.  Feel free to call the clinic you have any questions or concerns. The clinic phone number is [phone].    

## 2013-10-10 NOTE — Telephone Encounter (Signed)
Called pt left message regarding PICC flush tomorrow 10/21 before Radiation

## 2013-10-10 NOTE — Procedures (Signed)
Successful placement of dual lumen PICC line to right basilic vein. Length 40cm Tip at lower SVC/RA No complications Ready for use.  Brayton El PA-C Interventional Radiology 10/10/2013 12:21 PM

## 2013-10-10 NOTE — Progress Notes (Signed)
   [facility]    OFFICE PROGRESS NOTE   INTERVAL HISTORY:   [name] is scheduled to begin chemotherapy and radiation today. She complains of rectal pain. The pain is not relieved with tramadol. She also complains of intermittent nausea, no emesis. She acknowledges the nausea may be in part related to anxiety. Xanax and Compazine helped the nausea. She is constipated. She is currently taking a "stool softener "once daily.  Objective:  Vital signs in last 24 hours:  Blood pressure 109/54, pulse 68, temperature 97.4 F (36.3 C), temperature source Oral, resp. rate 18, height 5\' 5"  (1.651 m), weight 148 lb 14.4 oz (67.541 kg).    HEENT: Neck without mass Lymphatics: No inguinal nodes Resp: Lungs clear bilaterally Cardio: Regular rate and rhythm GI: No hepatomegaly, nontender,? Firm cutaneous nodular lesion in the right greater than left suprapubic areas Vascular: No leg edema   Lab Results:  Lab Results  Component Value Date   WBC 7.9 09/27/2013   HGB 12.8 09/27/2013   HCT 36.9 09/27/2013   MCV 91.6 09/27/2013   PLT 345 09/27/2013   ANC 5.3  X-rays: PET scan 09/23/2013-hypermetabolism at the splenic flexure with possible mild wall thickening. Hypermetabolic lesion at the right anorectal region with associated hypermetabolic right inguinal and bilateral perineal nodes. I reviewed the PET and CT images with the patient and her family.   Medications: I have reviewed the patient's current medications.  Assessment/Plan: 1. Squamous cell carcinoma the anus   Staging PET scan 09/23/2013 with a hypermetabolic anal mass, and hypermetabolic right inguinal/perineal nodes 2.? Palpable inguinal/pubic lymphadenopathy -the suprapubic palpable lesions appear hypermetabolic on the PET scan-I will review with a radiologist 3. Rectal Pain and bleeding secondary to #1  4. Nausea-most likely not related to the anal cancer 5. Thickening/hypermetabolism at the splenic  flexure-? Second primary      Disposition:  I discussed the staging PET scan and reviewed the images with the patient and her family. The plan is to begin treatment anal cancer today. We again reviewed the potential toxicities associated with 5-fluorouracil and mitomycin C. She agrees to proceed. She will return for an office visit and nadir CBC in approximately 10 days.  She will try hydrocodone for the anal pain. She will increase a stool softener to twice daily.  I will review the PET scan with a radiologist to discuss the apparent hypermetabolic suprapubic "nodes "and splenic flexure lesion.  Approximate 30 minutes were spent with patient today. The majority of the time was used for counseling and coordination of care.   Thornton Papas, MD  10/10/2013  3:27 PM

## 2013-10-11 ENCOUNTER — Ambulatory Visit
Admission: RE | Admit: 2013-10-11 | Discharge: 2013-10-11 | Disposition: A | Discharge status: Home / Self Care | Payer: Medicare Other | Source: Ambulatory Visit | Attending: Radiation Oncology | Admitting: Radiation Oncology

## 2013-10-11 ENCOUNTER — Other Ambulatory Visit: Payer: Self-pay | Admitting: Oncology

## 2013-10-11 ENCOUNTER — Ambulatory Visit (HOSPITAL_BASED_OUTPATIENT_CLINIC_OR_DEPARTMENT_OTHER): Payer: Medicare Other

## 2013-10-11 VITALS — BP 124/56 | HR 58 | Temp 97.9°F

## 2013-10-11 DIAGNOSIS — C210 Malignant neoplasm of anus, unspecified: Secondary | ICD-10-CM | POA: Diagnosis not present

## 2013-10-11 MED ORDER — HEPARIN SOD (PORK) LOCK FLUSH 100 UNIT/ML IV SOLN
250.0000 [IU] | Freq: Once | INTRAVENOUS | Status: DC
  Filled 2013-10-11: qty 5

## 2013-10-11 MED ORDER — SODIUM CHLORIDE 0.9 % IJ SOLN
10.0000 mL | Freq: Once | INTRAMUSCULAR | Status: DC
  Filled 2013-10-11: qty 10

## 2013-10-11 NOTE — Progress Notes (Signed)
Patient education done, radiation therapy and you book given with a sitz bath, instructions on use of sitz bath, discusses skin irritation, fatigue, n,v,d, pain, diet, teach back given by patient and daughter 3:20 PM

## 2013-10-11 NOTE — Telephone Encounter (Signed)
Message from pt's daughter requesting refill on Compazine. She called pharmacy and they said they were awaiting word from this office. Refill sent.

## 2013-10-11 NOTE — Patient Instructions (Signed)
Peripherally Inserted Central Catheter (PICC) Home Guide A peripherally inserted central catheter (PICC) is a long, thin, flexible tube that is inserted into a vein in the upper arm. It is a form of intravenous (IV) access. It is considered to be a "central" line because the tip of the PICC ends in a large vein in your chest. This large vein is called the superior vena cava (SVC). The PICC tip ends in the SVC because there is a lot of blood flow in the SVC. This allows medicines and IV fluids to be quickly distributed throughout the body. The PICC is inserted using a sterile technique by a specially trained nurse or physician. After the PICC is inserted, a chest X-ray is done to be sure it is in the correct place.  A PICC may be placed for different reasons, such as:  To give medicines and liquid nutrition that can only be given through a central line. Examples are:  Certain antibiotic treatments.  Chemotherapy.  Total parenteral nutrition (TPN).  To take frequent blood samples.  To give IV fluids and blood products.  If there is difficulty placing a peripheral intravenous (PIV) catheter. If taken care of properly, a PICC can remain in place for several months. A PICC can also allow patients to go home early. Medicine and PICC care can be managed at home by a family member or home healthcare team. RISKS AND COMPLICATIONS Possible problems with a PICC can occasionally occur. This may include:  A clot (thrombus) forming in or at the tip of the PICC. This can cause the PICC to become clogged. A "clot-busting" medicine called tissue plasminogen activator (tPA) can be inserted into the PICC to help break up the clot.  Inflammation of the vein (phlebitis) in which the PICC is placed. Signs of inflammation may include redness, pain at the insertion site, red streaks, or being able to feel a "cord" in the vein where the PICC is located.  Infection in the PICC or at the insertion site. Signs of  infection may include fever, chills, redness, swelling, or pus drainage from the PICC insertion site.  PICC movement (malposition). The PICC tip may migrate from its original position due to excessive physical activity, forceful coughing, sneezing, or vomiting.  A break or cut in the PICC. It is important to not use scissors near the PICC.  Nerve or tendon irritation or injury during PICC insertion. HOME CARE INSTRUCTIONS Activity  You may bend your arm and move it freely. If your PICC is near or at the bend of your elbow, avoid activity with repeated motion at the elbow.  Avoid lifting heavy objects as instructed by your caregiver.  Avoid using a crutch with the arm on the same side as your PICC. You may need to use a walker. PICC Dressing  Keep your PICC bandage (dressing) clean and dry to prevent infection.  Ask your caregiver when you may shower. Ask your caregiver to teach you how to wrap the PICC when you do take a shower.  Do not bathe, swim, or use hot tubs when you have a PICC.  Change the PICC dressing as instructed by your caregiver.  Change your PICC dressing if it becomes loose or wet. General PICC Care  Check the PICC insertion site daily for leakage, redness, swelling, or pain.  Flush the PICC as directed by your caregiver. Let your caregiver know right away if the PICC is difficult to flush or does not flush. Do not use force   to flush the PICC.  Do not use a syringe that is less than 10 mLs to flush the PICC.  Never pull or tug on the PICC.  Avoid blood pressure checks on the arm with the PICC.  Keep your PICC identification card with you at all times.  Do not take the PICC out yourself. Only a trained clinical professional should remove the PICC. SEEK IMMEDIATE MEDICAL CARE IF:  Your PICC is accidently pulled all the way out. If this happens, cover the insertion site with a bandage or gauze dressing. Do not throw the PICC away. Your caregiver will need to  inspect it.  Your PICC was tugged or pulled and has partially come out. Do not  push the PICC back in.  There is any type of drainage, redness, or swelling where the PICC enters the skin.  You cannot flush the PICC, it is difficult to flush, or the PICC leaks around the insertion site when it is flushed.  You hear a "flushing" sound when the PICC is flushed.  You have pain, discomfort, or numbness in your arm, shoulder, or jaw on the same side as the PICC .  You feel your heart "racing" or skipping beats.  You notice a hole or tear in the PICC.  You develop chills or a fever. MAKE SURE YOU:   Understand these instructions.  Will watch your condition.  Will get help right away if you are not doing well or get worse. Document Released: 06/14/2003 Document Revised: 03/01/2012 Document Reviewed: 04/14/2011 [employer] Patient Information 2014 [employer].  

## 2013-10-12 ENCOUNTER — Ambulatory Visit
Admission: RE | Admit: 2013-10-12 | Discharge: 2013-10-12 | Disposition: A | Discharge status: Home / Self Care | Payer: Medicare Other | Source: Ambulatory Visit | Attending: Radiation Oncology | Admitting: Radiation Oncology

## 2013-10-12 DIAGNOSIS — C210 Malignant neoplasm of anus, unspecified: Secondary | ICD-10-CM | POA: Diagnosis not present

## 2013-10-13 ENCOUNTER — Ambulatory Visit
Admission: RE | Admit: 2013-10-13 | Discharge: 2013-10-13 | Disposition: A | Discharge status: Home / Self Care | Payer: Medicare Other | Source: Ambulatory Visit | Attending: Radiation Oncology | Admitting: Radiation Oncology

## 2013-10-13 ENCOUNTER — Encounter: Payer: Self-pay | Admitting: Radiation Oncology

## 2013-10-13 ENCOUNTER — Ambulatory Visit: Payer: Medicare Other | Admitting: Nurse Practitioner

## 2013-10-13 VITALS — BP 120/53 | HR 71 | Temp 98.2°F | Resp 20 | Wt 154.2 lb

## 2013-10-13 DIAGNOSIS — C210 Malignant neoplasm of anus, unspecified: Secondary | ICD-10-CM

## 2013-10-13 MED ORDER — ALPRAZOLAM 0.25 MG PO TABS: 0.2500 mg | ORAL_TABLET | Freq: Three times a day (TID) | ORAL | Status: DC

## 2013-10-13 NOTE — Progress Notes (Signed)
Stool softners and prune juice not helping, also needs xanax refill ,has run out,

## 2013-10-13 NOTE — Progress Notes (Signed)
   Department of Radiation Oncology  Phone:  [phone] Fax:        [phone]  Weekly Treatment Note    Name: Cindy [name] Date: 10/13/2013 MRN: [id number] DOB: [birth year]   Current dose: 7.2 Gy  Current fraction: 4   MEDICATIONS: Current Outpatient Prescriptions  Medication Sig Dispense Refill  . ALPRAZolam (XANAX) 0.25 MG tablet Take 1 tablet (0.25 mg total) by mouth 3 (three) times daily.  60 tablet  0  . amLODipine (NORVASC) 5 MG tablet Take 5 mg by mouth every morning.       . estrogens, conjugated, (PREMARIN) 0.625 MG tablet Take 0.625 mg by mouth daily.       Marland Kitchen FLUoxetine (PROZAC) 40 MG capsule Take 40 mg by mouth every morning.       Marland Kitchen HYDROcodone-acetaminophen (NORCO/VICODIN) 5-325 MG per tablet Take 1 tablet by mouth every 4 (four) hours as needed for pain.  60 tablet  0  . levothyroxine (SYNTHROID, LEVOTHROID) 100 MCG tablet Take 100 mcg by mouth every morning.       . lidocaine (XYLOCAINE) 5 % ointment Apply topically as needed.  35.44 g  3  . ondansetron (ZOFRAN) 8 MG tablet Take 1 tablet (8 mg total) by mouth every 8 (eight) hours as needed for nausea.  20 tablet  1  . prochlorperazine (COMPAZINE) 5 MG tablet TAKE 1 TABLET BY MOUTH EVERY 8 HOURS AS NEEDED FOR NAUSEA.  30 tablet  1  . telmisartan (MICARDIS) 80 MG tablet Take 80 mg by mouth every morning.       . traMADol (ULTRAM) 50 MG tablet Take 1 tablet (50 mg total) by mouth every 6 (six) hours as needed for pain.  30 tablet  1  . trolamine salicylate (ASPERCREME) 10 % cream Apply 1 application topically as needed (pain).      Marland Kitchen zolpidem (AMBIEN) 10 MG tablet Take 10 mg by mouth at bedtime as needed for sleep.       No current facility-administered medications for this encounter.     ALLERGIES: Morphine and related   LABORATORY DATA:  Lab Results  Component Value Date   WBC 7.9 09/27/2013   HGB 12.8 09/27/2013   HCT 36.9 09/27/2013   MCV 91.6 09/27/2013   PLT 345 09/27/2013   Lab Results    Component Value Date   NA 127* 09/27/2013   K 4.4 09/27/2013   CL 97 03/18/2013   CO2 25 09/27/2013   Lab Results  Component Value Date   ALT 11 09/27/2013   AST 15 09/27/2013   ALKPHOS 66 09/27/2013   BILITOT 0.55 09/27/2013     NARRATIVE: Cindy [name] was seen today for weekly treatment management. The chart was checked and the patient's films were reviewed. The patient is doing satisfactorily with her treatment. No problems with radiation so far. She is constipated. She has been using a stool softener.  PHYSICAL EXAMINATION: weight is 154 lb 3.2 oz (69.945 kg). Her oral temperature is 98.2 F (36.8 C). Her blood pressure is 120/53 and her pulse is 71. Her respiration is 20.        ASSESSMENT: The patient is doing satisfactorily with treatment.  PLAN: We will continue with the patient's radiation treatment as planned. The patient will begin using MiraLax.

## 2013-10-13 NOTE — Progress Notes (Signed)
Weekly rad txs, pelvis 4/30 , c/o nausea and const[pation, last bm  Monday, ate peaches and and ham biscuit today with water, took her nausea medication and has helped that, weak, Mouth sores from chemotherapy 3:40 PM

## 2013-10-13 NOTE — Addendum Note (Signed)
Encounter addended by: Jonna Coup, MD on: 10/13/2013  4:18 PM<BR>     Documentation filed: Visit Diagnoses, Notes Section, Problem List, Follow-up Section, LOS Section, Orders

## 2013-10-14 ENCOUNTER — Telehealth: Payer: Self-pay | Admitting: *Deleted

## 2013-10-14 ENCOUNTER — Ambulatory Visit
Admission: RE | Admit: 2013-10-14 | Discharge: 2013-10-14 | Disposition: A | Discharge status: Home / Self Care | Payer: Medicare Other | Source: Ambulatory Visit | Attending: Radiation Oncology | Admitting: Radiation Oncology

## 2013-10-14 ENCOUNTER — Ambulatory Visit (HOSPITAL_BASED_OUTPATIENT_CLINIC_OR_DEPARTMENT_OTHER): Payer: Medicare Other

## 2013-10-14 DIAGNOSIS — C210 Malignant neoplasm of anus, unspecified: Secondary | ICD-10-CM

## 2013-10-14 NOTE — Progress Notes (Signed)
Right PICC line removed intact, patient tolerated procedure well and observed for 30 minutes post removal.

## 2013-10-14 NOTE — Patient Instructions (Signed)
Peripherally Inserted Central Catheter (PICC) Removal and Care After A peripherally inserted catheter (PICC) is removed when it is no longer needed, when it is clotted, or when it may be infected.  PROCEDURE  The removal of a PICC is usually painless. Removing the tape that holds the PICC in place may be the most discomfort you have.  A physicians order needs to be obtained to have the PICC removed.  A PICC can be removed in the hospital or in an outpatient setting.  Never remove or take out the PICC yourself. Only a trained clinical professional, such as a PICC nurse, should remove the PICC.  If a PICC is suspected to be infected, the PICC tip is sent to the lab for culture. HOME CARE INSTRUCTIONS  When the PICC is out, pressure is applied at the insertion site to prevent bleeding. An antibiotic ointment may be applied to the insertion site. A dry, sterile gauze is then taped over the insertion site. This dressing should stay on for 24 hours.  After the 24 hours is up, the dressing may be removed. The PICC insertion site is very small. A small scab may develop over the insertion site. It is okay to wash the site gently with soap and water. Be careful to not remove or pick the scab off. After washing, gently pat the site dry. You do not need to put another dressing over the insertion site after you wash it.  Avoid heavy, strenuous physical activity for 24 hours after the PICC is removed. This includes things like:  Weight lifting.  Strenuous yard work.  Any physical activity with repetitive arm movement. SEEK MEDICAL CARE IF:  Call or see your caregiver as soon as possible if you develop the following conditions in the arm in which the PICC was inserted:  Swelling or puffiness.  Increasing tenderness or pain. SEEK IMMEDIATE MEDICAL CARE IF:  You develop any of the following conditions in the arm that had the PICC:  Numbness or tingling in your fingers, hand, or arm.  You arm has  a bluish color and it is cold to the touch.  Redness around the insertion site or a red-streak that goes up your arm.  Any type of drainage from the PICC insertion site. This includes drainage such as:  Bleeding from the insertion site. (If this happens, apply firm, direct pressure to the PICC insertion site with a clean towel.)  Drainage that is yellow or tan in color.  You have an oral temperature above 102 F (38.9 C), not controlled by medicine. Document Released: 05/28/2010 Document Revised: 03/01/2012 Document Reviewed: 05/28/2010 [employer] Patient Information 2014 [employer].  

## 2013-10-14 NOTE — Telephone Encounter (Signed)
Per Dr. Truett Perna: Remove PICC line with pump disconnect today. Astrid Drafts, RN with this order.

## 2013-10-17 ENCOUNTER — Ambulatory Visit
Admission: RE | Admit: 2013-10-17 | Discharge: 2013-10-17 | Disposition: A | Discharge status: Home / Self Care | Payer: Medicare Other | Source: Ambulatory Visit | Attending: Radiation Oncology | Admitting: Radiation Oncology

## 2013-10-17 ENCOUNTER — Ambulatory Visit (HOSPITAL_BASED_OUTPATIENT_CLINIC_OR_DEPARTMENT_OTHER): Payer: Medicare Other | Admitting: Nurse Practitioner

## 2013-10-17 ENCOUNTER — Telehealth: Payer: Self-pay | Admitting: *Deleted

## 2013-10-17 VITALS — BP 130/57 | HR 77 | Temp 99.0°F | Resp 18 | Ht 65.0 in | Wt 146.4 lb

## 2013-10-17 DIAGNOSIS — C210 Malignant neoplasm of anus, unspecified: Secondary | ICD-10-CM

## 2013-10-17 DIAGNOSIS — R110 Nausea: Secondary | ICD-10-CM

## 2013-10-17 DIAGNOSIS — R21 Rash and other nonspecific skin eruption: Secondary | ICD-10-CM | POA: Diagnosis not present

## 2013-10-17 DIAGNOSIS — K1231 Oral mucositis (ulcerative) due to antineoplastic therapy: Secondary | ICD-10-CM | POA: Diagnosis not present

## 2013-10-17 NOTE — Telephone Encounter (Signed)
PT. ALSO HAS ULCERS INSIDE BOTTOM PORTION OF HER MOUTH. VERBAL ORDER AND READ BACK TO LISA THOMAS,NP- WILL SEE PT. NOTIFIED PT.

## 2013-10-17 NOTE — Progress Notes (Addendum)
OFFICE PROGRESS NOTE  Interval history:  Ms. Kataoka returns prior to scheduled followup. To review, she is an 79 year old woman with recently diagnosed anal cancer. She completed cycle 1 5-FU/mitomycin C. beginning 10/10/2013. She was seen in radiation oncology earlier today and referred to our office for evaluation of mouth sores and a skin rash.  She developed mouth soreness beginning 10/15/2013. She now has mouth ulcers. She is using Magic mouthwash but notes that it "burns". She was able to eat grits and ice cream this morning. She is tolerating water. She does not feel like drinking. She has occasional nausea. No vomiting. She denies diarrhea. She has a rash on the chest, hands and less so on the forearms. She noted onset of the rash over the past weekend. She has a "low-grade fever" of 99. No shaking chills.   Objective: Blood pressure 130/57, pulse 77, temperature 99 F (37.2 C), temperature source Oral, resp. rate 18, height 5\' 5"  (1.651 m), weight 146 lb 6.4 oz (66.407 kg).  Bilateral buccal regions are erythematous. Upper dentures were removed. Ulcers at the posterior palate, upper gum and lower anterior gumline/inner lip. Mucous membranes are moist appearing. Lungs are clear. Regular cardiac rhythm. Abdomen soft and nontender. No organomegaly. No leg edema. Perianal erythema. No skin breakdown. Mild erythema over the pubic region and bilateral groin regions. Erythematous rash at the upper chest, dorsum of the hands and forearms in areas of what appears to be sun damaged skin.  Lab Results: Lab Results  Component Value Date   WBC 7.9 09/27/2013   HGB 12.8 09/27/2013   HCT 36.9 09/27/2013   MCV 91.6 09/27/2013   PLT 345 09/27/2013    Chemistry:    Chemistry      Component Value Date/Time   NA 127* 09/27/2013 0916   NA 132* 03/18/2013 0430   K 4.4 09/27/2013 0916   K 3.9 03/18/2013 0430   CL 97 03/18/2013 0430   CO2 25 09/27/2013 0916   CO2 24 03/18/2013 0430   BUN 11.3 09/27/2013  0916   BUN 12 09/09/2013 1102   CREATININE 0.8 09/27/2013 0916   CREATININE 0.78 09/09/2013 1102   CREATININE 0.76 03/18/2013 0430      Component Value Date/Time   CALCIUM 9.4 09/27/2013 0916   CALCIUM 8.8 03/18/2013 0430   ALKPHOS 66 09/27/2013 0916   ALKPHOS 54 03/17/2013 1215   AST 15 09/27/2013 0916   AST 35 03/17/2013 1215   ALT 11 09/27/2013 0916   ALT 25 03/17/2013 1215   BILITOT 0.55 09/27/2013 0916   BILITOT 0.5 03/17/2013 1215       Studies/Results: Ct Chest W Contrast  09/23/2013   CLINICAL DATA:  Newly diagnosed anal cancer, for staging  EXAM: CT CHEST, ABDOMEN, AND PELVIS WITH CONTRAST  TECHNIQUE: Multidetector CT imaging of the chest, abdomen and pelvis was performed following the standard protocol during bolus administration of intravenous contrast.  CONTRAST:  OMNIPAQUE IOHEXOL 300 MG/ML  SOLN  COMPARISON:  None.  FINDINGS: CT CHEST FINDINGS  3 mm subpleural nodule in the right middle lobe (series 5/ image 33). 3 mm subpleural nodule in the left upper lobe (series 5/ image 19). These findings are not suspicious for metastatic disease.  Lungs otherwise clear. No pleural effusion or pneumothorax.  Heart is normal in size. No pericardial effusion. Coronary atherosclerosis.  No suspicious mediastinal, hilar, or axillary lymphadenopathy.  Very mild degenerative changes of the thoracic spine.  CT ABDOMEN AND PELVIS FINDINGS  Liver is notable  for focal fat/altered perfusion along the falciform ligament.  Spleen, pancreas, and adrenal glands are within normal limits.  Status post cholecystectomy. No intrahepatic or extrahepatic ductal dilatation.  6 mm lateral interpolar left renal cyst. Right kidney is unremarkable. No hydronephrosis.  No evidence of bowel obstruction. Colonic diverticulosis without associated inflammatory changes. 3.1 x 2.4 x 3.0 cm lesion along the right aspect of the anorectal region (series 2/ image 113), corresponding to known anal cancer.  Atherosclerotic calcifications  of the abdominal aorta and branch vessels.  No evidence of abdominopelvic ascites.  13 mm short axis right inguinal node (series 2/images 108) and bilateral 11 mm perineal nodes (series 2/image 107), FDG-avid on concurrent PET, suspicious for nodal metastases.  Status post hysterectomy. No adnexal masses.  Bladder is within normal limits.  Mild degenerative changes at L5-S1.  IMPRESSION: CT CHEST IMPRESSION  No evidence of metastatic disease in the chest.  3 mm subpleural nodules in the right middle and left upper lobes, likely benign.  If the patient is high risk for primary bronchogenic neoplasm, consider a single follow-up CT chest in 1 year. Otherwise, no dedicated follow-up imaging is required.  CT ABDOMEN AND PELVIS IMPRESSION  3.1 cm mass along the right aspect of the anorectal region, corresponding to known rectal cancer.  Associated right inguinal and bilateral perineal nodal metastases, as described above.   Electronically Signed   By: Charline Bills M.D.   On: 09/23/2013 16:24   Ct Abdomen Pelvis W Contrast  09/23/2013   CLINICAL DATA:  Newly diagnosed anal cancer, for staging  EXAM: CT CHEST, ABDOMEN, AND PELVIS WITH CONTRAST  TECHNIQUE: Multidetector CT imaging of the chest, abdomen and pelvis was performed following the standard protocol during bolus administration of intravenous contrast.  CONTRAST:  OMNIPAQUE IOHEXOL 300 MG/ML  SOLN  COMPARISON:  None.  FINDINGS: CT CHEST FINDINGS  3 mm subpleural nodule in the right middle lobe (series 5/ image 33). 3 mm subpleural nodule in the left upper lobe (series 5/ image 19). These findings are not suspicious for metastatic disease.  Lungs otherwise clear. No pleural effusion or pneumothorax.  Heart is normal in size. No pericardial effusion. Coronary atherosclerosis.  No suspicious mediastinal, hilar, or axillary lymphadenopathy.  Very mild degenerative changes of the thoracic spine.  CT ABDOMEN AND PELVIS FINDINGS  Liver is notable for focal  fat/altered perfusion along the falciform ligament.  Spleen, pancreas, and adrenal glands are within normal limits.  Status post cholecystectomy. No intrahepatic or extrahepatic ductal dilatation.  6 mm lateral interpolar left renal cyst. Right kidney is unremarkable. No hydronephrosis.  No evidence of bowel obstruction. Colonic diverticulosis without associated inflammatory changes. 3.1 x 2.4 x 3.0 cm lesion along the right aspect of the anorectal region (series 2/ image 113), corresponding to known anal cancer.  Atherosclerotic calcifications of the abdominal aorta and branch vessels.  No evidence of abdominopelvic ascites.  13 mm short axis right inguinal node (series 2/images 108) and bilateral 11 mm perineal nodes (series 2/image 107), FDG-avid on concurrent PET, suspicious for nodal metastases.  Status post hysterectomy. No adnexal masses.  Bladder is within normal limits.  Mild degenerative changes at L5-S1.  IMPRESSION: CT CHEST IMPRESSION  No evidence of metastatic disease in the chest.  3 mm subpleural nodules in the right middle and left upper lobes, likely benign.  If the patient is high risk for primary bronchogenic neoplasm, consider a single follow-up CT chest in 1 year. Otherwise, no dedicated follow-up imaging is required.  CT ABDOMEN AND PELVIS IMPRESSION  3.1 cm mass along the right aspect of the anorectal region, corresponding to known rectal cancer.  Associated right inguinal and bilateral perineal nodal metastases, as described above.   Electronically Signed   By: Charline Bills M.D.   On: 09/23/2013 16:24   Nm Pet Image Initial (pi) Skull Base To Thigh  09/23/2013   CLINICAL DATA:  Initial treatment strategy for anal cancer.  EXAM: NUCLEAR MEDICINE PET SKULL BASE TO THIGH  FASTING BLOOD GLUCOSE:  Value:  100 mg/dl  TECHNIQUE: 16.1 mCi W-96 FDG was injected intravenously. CT data was obtained and used for attenuation correction and anatomic localization only. (This was not acquired as a  diagnostic CT examination.) Additional exam technical data entered on technologist worksheet.  COMPARISON:  None.  FINDINGS: NECK  No hypermetabolic lymph nodes in the neck.  CHEST  No hypermetabolic mediastinal or hilar nodes.  No suspicious pulmonary nodules on the CT scan.  ABDOMEN/PELVIS  No abnormal hypermetabolic activity within the liver, pancreas, adrenal glands, or spleen.  Hypermetabolism involving the right colon along the splenic flexure, max SUV 8.3 (PET image 134), with possible mild wall thickening although underdistended on concurrent CT.  Prior cholecystectomy and hysterectomy.  Hypermetabolic lesion along the right anorectal region, max SUV 31.0 (PET image 209), corresponding to known anal cancer.  Associated right inguinal and bilateral perineal nodes (series 2/ images 197 and 200), max SUV 12.8.  SKELETON  No focal hypermetabolic activity to suggest skeletal metastasis.  IMPRESSION: Hypermetabolic lesion along the right anorectal region, max SUV 31.0, corresponding to known anal cancer.  Associated right inguinal and bilateral perineal nodal metastases, max SUV 12.8.  Hypermetabolism with possible wall thickening along the splenic flexure, max SUV 8.3, although favored to reflect underdistention/ peristalsis when correlating with concurrent enhanced CT.   Electronically Signed   By: Charline Bills M.D.   On: 09/23/2013 16:23   Ir Fluoro Guide Cv Line Right  10/10/2013   CLINICAL DATA:  Patient with anal carcinoma, to receive chemotherapy.  EXAM: Powered PICC LINE PLACEMENT WITH ULTRASOUND AND FLUOROSCOPIC GUIDANCE  FLUOROSCOPY TIME:  0 min, 12 seconds  PROCEDURE: The patient was advised of the possible risks and complications and agreed to undergo the procedure. The patient was then brought to the angiographic suite for the procedure.  The right arm was prepped with chlorhexidine, draped in the usual sterile fashion using maximum barrier technique (cap and mask, sterile gown, sterile  gloves, large sterile sheet, hand hygiene and cutaneous antisepsis) and infiltrated locally with 1% Lidocaine.  Ultrasound demonstrated patency of the right basilic vein, and this was documented with an image. Under real-time ultrasound guidance, this vein was accessed with a 21 gauge micropuncture needle and image documentation was performed. A 0.018 wire was introduced in to the vein. Over this, a 5 [place] dual lumen power injectable PICC, cut to a length of 40cm, was advanced to the lower SVC/right atrial junction. Fluoroscopy during the procedure and fluoro spot radiograph confirms appropriate catheter position. The catheter was flushed and covered with a sterile dressing.  Complications: None  IMPRESSION: Successful right arm power PICC line placement with ultrasound and fluoroscopic guidance. The catheter is ready for use.  Read by Brayton El PA-C   Electronically Signed   By: Richarda Overlie M.D.   On: 10/10/2013 12:33   Ir US Guide Vasc Access Right  10/12/2013   CLINICAL DATA:  Patient with anal carcinoma, to receive chemotherapy.  EXAM: Powered PICC  LINE PLACEMENT WITH ULTRASOUND AND FLUOROSCOPIC GUIDANCE  FLUOROSCOPY TIME:  0 min, 12 seconds  PROCEDURE: The patient was advised of the possible risks and complications and agreed to undergo the procedure. The patient was then brought to the angiographic suite for the procedure.  The right arm was prepped with chlorhexidine, draped in the usual sterile fashion using maximum barrier technique (cap and mask, sterile gown, sterile gloves, large sterile sheet, hand hygiene and cutaneous antisepsis) and infiltrated locally with 1% Lidocaine.  Ultrasound demonstrated patency of the right basilic vein, and this was documented with an image. Under real-time ultrasound guidance, this vein was accessed with a 21 gauge micropuncture needle and image documentation was performed. A 0.018 wire was introduced in to the vein. Over this, a 5 [place] dual lumen power  injectable PICC, cut to a length of 40cm, was advanced to the lower SVC/right atrial junction. Fluoroscopy during the procedure and fluoro spot radiograph confirms appropriate catheter position. The catheter was flushed and covered with a sterile dressing.  Complications: None  IMPRESSION: Successful right arm power PICC line placement with ultrasound and fluoroscopic guidance. The catheter is ready for use.  Read by Brayton El PA-C   Electronically Signed   By: Richarda Overlie M.D.   On: 10/12/2013 17:16    Medications: I have reviewed the patient's current medications.  Assessment/Plan:  1. Squamous cell carcinoma of the anus. Staging PET scan 09/23/2013 of a hypermetabolic anal mass and hypermetabolic right inguinal/perineal nodes. Radiation and cycle 1 5-FU/mitomycin C. beginning 10/10/2013. 2. Question palpable inguinal/pubic lymphadenopathy. The suprapubic palpable lesions appeared hypermetabolic on the PET scan. 3. Rectal pain and bleeding secondary to #1. 4. Nausea predating chemotherapy. She continues to have mild intermittent nausea. 5. Thickening/hypermetabolism at the splenic flexure. Question second primary. 6. Mucositis secondary to chemotherapy. 7. Skin rash likely related to 5-fluorouracil.  Disposition-Ms. Vanallen continues radiation. She completed cycle 1 5-FU/mitomycin C. beginning 10/10/2013. She has mucositis and a skin rash.  She will continue supportive care for the mucositis including Magic mouthwash and pain medication if needed. She will  push fluids as tolerated. She and her daughter understand to contact the office if she is unable to maintain adequate hydration orally and we will arrange for IV fluids.  The skin rash is most likely related to 5-fluorouracil.  She is scheduled for a return visit with labs on 10/19/2013. She will contact the office in the interim as outlined above or with any other problems.  Approximately 25 minutes were spent face-to-face at  today's visit with the majority of that time involved in counseling/coordination of care.  Plan reviewed with Dr. Truett Perna.  Lonna Cobb ANP/GNP-BC   This was a shared visit with Lonna Cobb.she has developed mucositis and a skin rash secondary to chemotherapy. She will continue mouth rinses and push fluids as tolerated. She will return for an office visit and nadir CBC on 10/19/2013.  Mancel Bale, M.D.

## 2013-10-18 ENCOUNTER — Other Ambulatory Visit: Payer: Self-pay | Admitting: *Deleted

## 2013-10-18 ENCOUNTER — Other Ambulatory Visit (HOSPITAL_BASED_OUTPATIENT_CLINIC_OR_DEPARTMENT_OTHER): Payer: Medicare Other | Admitting: Lab

## 2013-10-18 ENCOUNTER — Ambulatory Visit (HOSPITAL_BASED_OUTPATIENT_CLINIC_OR_DEPARTMENT_OTHER): Payer: Medicare Other

## 2013-10-18 ENCOUNTER — Telehealth: Payer: Self-pay | Admitting: *Deleted

## 2013-10-18 ENCOUNTER — Ambulatory Visit
Admission: RE | Admit: 2013-10-18 | Discharge: 2013-10-18 | Disposition: A | Discharge status: Home / Self Care | Payer: Medicare Other | Source: Ambulatory Visit | Attending: Radiation Oncology | Admitting: Radiation Oncology

## 2013-10-18 VITALS — BP 106/51 | HR 72 | Temp 98.3°F | Resp 16

## 2013-10-18 DIAGNOSIS — E860 Dehydration: Secondary | ICD-10-CM

## 2013-10-18 DIAGNOSIS — C210 Malignant neoplasm of anus, unspecified: Secondary | ICD-10-CM | POA: Diagnosis not present

## 2013-10-18 LAB — CBC WITH DIFFERENTIAL/PLATELET
BASO%: 0.7 % (ref 0.0–2.0)
EOS%: 3.9 % (ref 0.0–7.0)
HCT: 30.6 % — ABNORMAL LOW (ref 34.8–46.6)
MCH: 31.2 pg (ref 25.1–34.0)
MCHC: 34.8 g/dL (ref 31.5–36.0)
MCV: 89.7 fL (ref 79.5–101.0)
NEUT%: 72 % (ref 38.4–76.8)
Platelets: 132 10*3/uL — ABNORMAL LOW (ref 145–400)
RBC: 3.41 10*6/uL — ABNORMAL LOW (ref 3.70–5.45)

## 2013-10-18 LAB — COMPREHENSIVE METABOLIC PANEL (CC13)
ALT: 11 U/L (ref 0–55)
AST: 15 U/L (ref 5–34)
Albumin: 2.9 g/dL — ABNORMAL LOW (ref 3.5–5.0)
BUN: 11.8 mg/dL (ref 7.0–26.0)
Calcium: 9 mg/dL (ref 8.4–10.4)
Chloride: 93 mEq/L — ABNORMAL LOW (ref 98–109)
Potassium: 3.5 mEq/L (ref 3.5–5.1)

## 2013-10-18 MED ORDER — SODIUM CHLORIDE 0.9 % IV SOLN
Freq: Once | INTRAVENOUS | Status: AC
  Administered 2013-10-18: 14:00:00 via INTRAVENOUS

## 2013-10-18 NOTE — Patient Instructions (Addendum)
Due to dehydration and hypotension, you need to hold your norvasc and micardis until further notice...   Dehydration, Adult Dehydration is when you lose more fluids from the body than you take in. Vital organs like the kidneys, brain, and heart cannot function without a proper amount of fluids and salt. Any loss of fluids from the body can cause dehydration.  CAUSES   Vomiting.  Diarrhea.  Excessive sweating.  Excessive urine output.  Fever. SYMPTOMS  Mild dehydration  Thirst.  Dry lips.  Slightly dry mouth. Moderate dehydration  Very dry mouth.  Sunken eyes.  Skin does not bounce back quickly when lightly pinched and released.  Dark urine and decreased urine production.  Decreased tear production.  Headache. Severe dehydration  Very dry mouth.  Extreme thirst.  Rapid, weak pulse (more than 100 beats per minute at rest).  Cold hands and feet.  Not able to sweat in spite of heat and temperature.  Rapid breathing.  Blue lips.  Confusion and lethargy.  Difficulty being awakened.  Minimal urine production.  No tears. DIAGNOSIS  Your caregiver will diagnose dehydration based on your symptoms and your exam. Blood and urine tests will help confirm the diagnosis. The diagnostic evaluation should also identify the cause of dehydration. TREATMENT  Treatment of mild or moderate dehydration can often be done at home by increasing the amount of fluids that you drink. It is best to drink small amounts of fluid more often. Drinking too much at one time can make vomiting worse. Refer to the home care instructions below. Severe dehydration needs to be treated at the hospital where you will probably be given intravenous (IV) fluids that contain water and electrolytes. HOME CARE INSTRUCTIONS   Ask your caregiver about specific rehydration instructions.  Drink enough fluids to keep your urine clear or pale yellow.  Drink small amounts frequently if you have nausea  and vomiting.  Eat as you normally do.  Avoid:  Foods or drinks high in sugar.  Carbonated drinks.  Juice.  Extremely hot or cold fluids.  Drinks with caffeine.  Fatty, greasy foods.  Alcohol.  Tobacco.  Overeating.  Gelatin desserts.  Wash your hands well to avoid spreading bacteria and viruses.  Only take over-the-counter or prescription medicines for pain, discomfort, or fever as directed by your caregiver.  Ask your caregiver if you should continue all prescribed and over-the-counter medicines.  Keep all follow-up appointments with your caregiver. SEEK MEDICAL CARE IF:  You have abdominal pain and it increases or stays in one area (localizes).  You have a rash, stiff neck, or severe headache.  You are irritable, sleepy, or difficult to awaken.  You are weak, dizzy, or extremely thirsty. SEEK IMMEDIATE MEDICAL CARE IF:   You are unable to keep fluids down or you get worse despite treatment.  You have frequent episodes of vomiting or diarrhea.  You have blood or green matter (bile) in your vomit.  You have blood in your stool or your stool looks black and tarry.  You have not urinated in 6 to 8 hours, or you have only urinated a small amount of very dark urine.  You have a fever.  You faint. MAKE SURE YOU:   Understand these instructions.  Will watch your condition.  Will get help right away if you are not doing well or get worse. Document Released: 12/08/2005 Document Revised: 03/01/2012 Document Reviewed: 07/28/2011 [facility] Patient Information 2014 [place], [place].

## 2013-10-18 NOTE — Telephone Encounter (Signed)
Requests patient have IV fluids today. Not taking enough due to pain in mouth and throat. Scheduled for fluids today at 1:45 pm. Attempted to reach daughter on cell-had to leave voice mail. Called patient and gave her the time to come in. She understands and agrees.  Add labs per Lonna Cobb, NP.

## 2013-10-19 ENCOUNTER — Other Ambulatory Visit: Payer: Self-pay | Admitting: Nurse Practitioner

## 2013-10-19 ENCOUNTER — Ambulatory Visit (HOSPITAL_BASED_OUTPATIENT_CLINIC_OR_DEPARTMENT_OTHER): Payer: Medicare Other

## 2013-10-19 ENCOUNTER — Other Ambulatory Visit (HOSPITAL_BASED_OUTPATIENT_CLINIC_OR_DEPARTMENT_OTHER): Payer: Medicare Other | Admitting: Lab

## 2013-10-19 ENCOUNTER — Ambulatory Visit
Admission: RE | Admit: 2013-10-19 | Discharge: 2013-10-19 | Disposition: A | Discharge status: Home / Self Care | Payer: Medicare Other | Source: Ambulatory Visit | Attending: Radiation Oncology | Admitting: Radiation Oncology

## 2013-10-19 ENCOUNTER — Ambulatory Visit (HOSPITAL_BASED_OUTPATIENT_CLINIC_OR_DEPARTMENT_OTHER): Payer: Medicare Other | Admitting: Nurse Practitioner

## 2013-10-19 VITALS — BP 106/48 | HR 84 | Temp 98.8°F | Resp 18 | Ht 65.0 in | Wt 146.7 lb

## 2013-10-19 DIAGNOSIS — R110 Nausea: Secondary | ICD-10-CM | POA: Diagnosis not present

## 2013-10-19 DIAGNOSIS — C210 Malignant neoplasm of anus, unspecified: Secondary | ICD-10-CM | POA: Diagnosis not present

## 2013-10-19 DIAGNOSIS — K625 Hemorrhage of anus and rectum: Secondary | ICD-10-CM

## 2013-10-19 DIAGNOSIS — K1231 Oral mucositis (ulcerative) due to antineoplastic therapy: Secondary | ICD-10-CM

## 2013-10-19 DIAGNOSIS — G893 Neoplasm related pain (acute) (chronic): Secondary | ICD-10-CM | POA: Diagnosis not present

## 2013-10-19 DIAGNOSIS — D702 Other drug-induced agranulocytosis: Secondary | ICD-10-CM | POA: Diagnosis not present

## 2013-10-19 DIAGNOSIS — D6959 Other secondary thrombocytopenia: Secondary | ICD-10-CM

## 2013-10-19 DIAGNOSIS — R21 Rash and other nonspecific skin eruption: Secondary | ICD-10-CM | POA: Diagnosis not present

## 2013-10-19 LAB — BASIC METABOLIC PANEL (CC13)
Anion Gap: 9 mEq/L (ref 3–11)
BUN: 12.3 mg/dL (ref 7.0–26.0)
CO2: 22 mEq/L (ref 22–29)
Calcium: 9 mg/dL (ref 8.4–10.4)
Chloride: 97 mEq/L — ABNORMAL LOW (ref 98–109)
Glucose: 115 mg/dl (ref 70–140)
Potassium: 3.5 mEq/L (ref 3.5–5.1)
Sodium: 128 mEq/L — ABNORMAL LOW (ref 136–145)

## 2013-10-19 LAB — CBC WITH DIFFERENTIAL/PLATELET
BASO%: 0 % (ref 0.0–2.0)
EOS%: 3.9 % (ref 0.0–7.0)
Eosinophils Absolute: 0 10*3/uL (ref 0.0–0.5)
MCH: 30.8 pg (ref 25.1–34.0)
MCV: 87.7 fL (ref 79.5–101.0)
MONO%: 2 % (ref 0.0–14.0)
NEUT#: 0.3 10*3/uL — CL (ref 1.5–6.5)
RBC: 3.41 10*6/uL — ABNORMAL LOW (ref 3.70–5.45)
RDW: 11.6 % (ref 11.2–14.5)
WBC: 0.5 10*3/uL — CL (ref 3.9–10.3)
nRBC: 0 % (ref 0–0)

## 2013-10-19 MED ORDER — LEVOFLOXACIN 500 MG PO TABS: 500.0000 mg | ORAL_TABLET | Freq: Every day | ORAL | Status: DC

## 2013-10-19 MED ORDER — SODIUM CHLORIDE 0.9 % IV SOLN
1000.0000 mL | Freq: Once | INTRAVENOUS | Status: AC
  Administered 2013-10-19: 1000 mL via INTRAVENOUS

## 2013-10-19 NOTE — Progress Notes (Signed)
OFFICE PROGRESS NOTE  Interval history:  Cindy [name] returns as scheduled. She received IV fluids in the office yesterday. Mouth continues to be sore. She notes temporary improvement in the mouth discomfort with saline rinses. She is tolerating some liquids. She felt better after the IV fluids. She has mild intermittent nausea. Skin rash is better. She denies any fever. No diarrhea. No lightheadedness or dizziness.   Objective: Blood pressure 106/48, pulse 84, temperature 98.8 F (37.1 C), temperature source Oral, resp. rate 18, height 5\' 5"  (1.651 m), weight 146 lb 11.2 oz (66.543 kg).  Bilateral regions are erythematous without discrete ulcers. Small ulcers at the upper gum. Larger ulcers at the low anterior gum line/lip. Mucous membranes appear moist. Lungs are clear. Regular cardiac rhythm. Abdomen soft and nontender. No organomegaly. No leg edema. Perianal erythema without skin breakdown. Persistent erythematous rash at the upper chest and dorsum of the hands.  Lab Results: Lab Results  Component Value Date   WBC 0.5* 10/19/2013   HGB 10.5* 10/19/2013   HCT 29.9* 10/19/2013   MCV 87.7 10/19/2013   PLT 104* 10/19/2013    Chemistry:    Chemistry      Component Value Date/Time   NA 123* 10/18/2013 1352   NA 132* 03/18/2013 0430   K 3.5 10/18/2013 1352   K 3.9 03/18/2013 0430   CL 97 03/18/2013 0430   CO2 21* 10/18/2013 1352   CO2 24 03/18/2013 0430   BUN 11.8 10/18/2013 1352   BUN 12 09/09/2013 1102   CREATININE 0.7 10/18/2013 1352   CREATININE 0.78 09/09/2013 1102   CREATININE 0.76 03/18/2013 0430      Component Value Date/Time   CALCIUM 9.0 10/18/2013 1352   CALCIUM 8.8 03/18/2013 0430   ALKPHOS 56 10/18/2013 1352   ALKPHOS 54 03/17/2013 1215   AST 15 10/18/2013 1352   AST 35 03/17/2013 1215   ALT 11 10/18/2013 1352   ALT 25 03/17/2013 1215   BILITOT 0.56 10/18/2013 1352   BILITOT 0.5 03/17/2013 1215       Studies/Results: Ct Chest W Contrast  09/23/2013   CLINICAL  DATA:  Newly diagnosed anal cancer, for staging  EXAM: CT CHEST, ABDOMEN, AND PELVIS WITH CONTRAST  TECHNIQUE: Multidetector CT imaging of the chest, abdomen and pelvis was performed following the standard protocol during bolus administration of intravenous contrast.  CONTRAST:  OMNIPAQUE IOHEXOL 300 MG/ML  SOLN  COMPARISON:  None.  FINDINGS: CT CHEST FINDINGS  3 mm subpleural nodule in the right middle lobe (series 5/ image 33). 3 mm subpleural nodule in the left upper lobe (series 5/ image 19). These findings are not suspicious for metastatic disease.  Lungs otherwise clear. No pleural effusion or pneumothorax.  Heart is normal in size. No pericardial effusion. Coronary atherosclerosis.  No suspicious mediastinal, hilar, or axillary lymphadenopathy.  Very mild degenerative changes of the thoracic spine.  CT ABDOMEN AND PELVIS FINDINGS  Liver is notable for focal fat/altered perfusion along the falciform ligament.  Spleen, pancreas, and adrenal glands are within normal limits.  Status post cholecystectomy. No intrahepatic or extrahepatic ductal dilatation.  6 mm lateral interpolar left renal cyst. Right kidney is unremarkable. No hydronephrosis.  No evidence of bowel obstruction. Colonic diverticulosis without associated inflammatory changes. 3.1 x 2.4 x 3.0 cm lesion along the right aspect of the anorectal region (series 2/ image 113), corresponding to known anal cancer.  Atherosclerotic calcifications of the abdominal aorta and branch vessels.  No evidence of abdominopelvic ascites.  13  mm short axis right inguinal node (series 2/images 108) and bilateral 11 mm perineal nodes (series 2/image 107), FDG-avid on concurrent PET, suspicious for nodal metastases.  Status post hysterectomy. No adnexal masses.  Bladder is within normal limits.  Mild degenerative changes at L5-S1.  IMPRESSION: CT CHEST IMPRESSION  No evidence of metastatic disease in the chest.  3 mm subpleural nodules in the right middle and left  upper lobes, likely benign.  If the patient is high risk for primary bronchogenic neoplasm, consider a single follow-up CT chest in 1 year. Otherwise, no dedicated follow-up imaging is required.  CT ABDOMEN AND PELVIS IMPRESSION  3.1 cm mass along the right aspect of the anorectal region, corresponding to known rectal cancer.  Associated right inguinal and bilateral perineal nodal metastases, as described above.   Electronically Signed   By: Charline Bills M.D.   On: 09/23/2013 16:24   Ct Abdomen Pelvis W Contrast  09/23/2013   CLINICAL DATA:  Newly diagnosed anal cancer, for staging  EXAM: CT CHEST, ABDOMEN, AND PELVIS WITH CONTRAST  TECHNIQUE: Multidetector CT imaging of the chest, abdomen and pelvis was performed following the standard protocol during bolus administration of intravenous contrast.  CONTRAST:  OMNIPAQUE IOHEXOL 300 MG/ML  SOLN  COMPARISON:  None.  FINDINGS: CT CHEST FINDINGS  3 mm subpleural nodule in the right middle lobe (series 5/ image 33). 3 mm subpleural nodule in the left upper lobe (series 5/ image 19). These findings are not suspicious for metastatic disease.  Lungs otherwise clear. No pleural effusion or pneumothorax.  Heart is normal in size. No pericardial effusion. Coronary atherosclerosis.  No suspicious mediastinal, hilar, or axillary lymphadenopathy.  Very mild degenerative changes of the thoracic spine.  CT ABDOMEN AND PELVIS FINDINGS  Liver is notable for focal fat/altered perfusion along the falciform ligament.  Spleen, pancreas, and adrenal glands are within normal limits.  Status post cholecystectomy. No intrahepatic or extrahepatic ductal dilatation.  6 mm lateral interpolar left renal cyst. Right kidney is unremarkable. No hydronephrosis.  No evidence of bowel obstruction. Colonic diverticulosis without associated inflammatory changes. 3.1 x 2.4 x 3.0 cm lesion along the right aspect of the anorectal region (series 2/ image 113), corresponding to known anal  cancer.  Atherosclerotic calcifications of the abdominal aorta and branch vessels.  No evidence of abdominopelvic ascites.  13 mm short axis right inguinal node (series 2/images 108) and bilateral 11 mm perineal nodes (series 2/image 107), FDG-avid on concurrent PET, suspicious for nodal metastases.  Status post hysterectomy. No adnexal masses.  Bladder is within normal limits.  Mild degenerative changes at L5-S1.  IMPRESSION: CT CHEST IMPRESSION  No evidence of metastatic disease in the chest.  3 mm subpleural nodules in the right middle and left upper lobes, likely benign.  If the patient is high risk for primary bronchogenic neoplasm, consider a single follow-up CT chest in 1 year. Otherwise, no dedicated follow-up imaging is required.  CT ABDOMEN AND PELVIS IMPRESSION  3.1 cm mass along the right aspect of the anorectal region, corresponding to known rectal cancer.  Associated right inguinal and bilateral perineal nodal metastases, as described above.   Electronically Signed   By: Charline Bills M.D.   On: 09/23/2013 16:24   Nm Pet Image Initial (pi) Skull Base To Thigh  09/23/2013   CLINICAL DATA:  Initial treatment strategy for anal cancer.  EXAM: NUCLEAR MEDICINE PET SKULL BASE TO THIGH  FASTING BLOOD GLUCOSE:  Value:  100 mg/dl  TECHNIQUE: 18.6  mCi F-18 FDG was injected intravenously. CT data was obtained and used for attenuation correction and anatomic localization only. (This was not acquired as a diagnostic CT examination.) Additional exam technical data entered on technologist worksheet.  COMPARISON:  None.  FINDINGS: NECK  No hypermetabolic lymph nodes in the neck.  CHEST  No hypermetabolic mediastinal or hilar nodes.  No suspicious pulmonary nodules on the CT scan.  ABDOMEN/PELVIS  No abnormal hypermetabolic activity within the liver, pancreas, adrenal glands, or spleen.  Hypermetabolism involving the right colon along the splenic flexure, max SUV 8.3 (PET image 134), with possible mild wall  thickening although underdistended on concurrent CT.  Prior cholecystectomy and hysterectomy.  Hypermetabolic lesion along the right anorectal region, max SUV 31.0 (PET image 209), corresponding to known anal cancer.  Associated right inguinal and bilateral perineal nodes (series 2/ images 197 and 200), max SUV 12.8.  SKELETON  No focal hypermetabolic activity to suggest skeletal metastasis.  IMPRESSION: Hypermetabolic lesion along the right anorectal region, max SUV 31.0, corresponding to known anal cancer.  Associated right inguinal and bilateral perineal nodal metastases, max SUV 12.8.  Hypermetabolism with possible wall thickening along the splenic flexure, max SUV 8.3, although favored to reflect underdistention/ peristalsis when correlating with concurrent enhanced CT.   Electronically Signed   By: Charline Bills M.D.   On: 09/23/2013 16:23   Ir Fluoro Guide Cv Line Right  10/10/2013   CLINICAL DATA:  Patient with anal carcinoma, to receive chemotherapy.  EXAM: Powered PICC LINE PLACEMENT WITH ULTRASOUND AND FLUOROSCOPIC GUIDANCE  FLUOROSCOPY TIME:  0 min, 12 seconds  PROCEDURE: The patient was advised of the possible risks and complications and agreed to undergo the procedure. The patient was then brought to the angiographic suite for the procedure.  The right arm was prepped with chlorhexidine, draped in the usual sterile fashion using maximum barrier technique (cap and mask, sterile gown, sterile gloves, large sterile sheet, hand hygiene and cutaneous antisepsis) and infiltrated locally with 1% Lidocaine.  Ultrasound demonstrated patency of the right basilic vein, and this was documented with an image. Under real-time ultrasound guidance, this vein was accessed with a 21 gauge micropuncture needle and image documentation was performed. A 0.018 wire was introduced in to the vein. Over this, a 5 [place] dual lumen power injectable PICC, cut to a length of 40cm, was advanced to the lower SVC/right atrial  junction. Fluoroscopy during the procedure and fluoro spot radiograph confirms appropriate catheter position. The catheter was flushed and covered with a sterile dressing.  Complications: None  IMPRESSION: Successful right arm power PICC line placement with ultrasound and fluoroscopic guidance. The catheter is ready for use.  Read by Brayton El PA-C   Electronically Signed   By: Richarda Overlie M.D.   On: 10/10/2013 12:33   Ir US Guide Vasc Access Right  10/12/2013   CLINICAL DATA:  Patient with anal carcinoma, to receive chemotherapy.  EXAM: Powered PICC LINE PLACEMENT WITH ULTRASOUND AND FLUOROSCOPIC GUIDANCE  FLUOROSCOPY TIME:  0 min, 12 seconds  PROCEDURE: The patient was advised of the possible risks and complications and agreed to undergo the procedure. The patient was then brought to the angiographic suite for the procedure.  The right arm was prepped with chlorhexidine, draped in the usual sterile fashion using maximum barrier technique (cap and mask, sterile gown, sterile gloves, large sterile sheet, hand hygiene and cutaneous antisepsis) and infiltrated locally with 1% Lidocaine.  Ultrasound demonstrated patency of the right basilic vein, and this  was documented with an image. Under real-time ultrasound guidance, this vein was accessed with a 21 gauge micropuncture needle and image documentation was performed. A 0.018 wire was introduced in to the vein. Over this, a 5 [place] dual lumen power injectable PICC, cut to a length of 40cm, was advanced to the lower SVC/right atrial junction. Fluoroscopy during the procedure and fluoro spot radiograph confirms appropriate catheter position. The catheter was flushed and covered with a sterile dressing.  Complications: None  IMPRESSION: Successful right arm power PICC line placement with ultrasound and fluoroscopic guidance. The catheter is ready for use.  Read by Brayton El PA-C   Electronically Signed   By: Richarda Overlie M.D.   On: 10/12/2013 17:16     Medications: I have reviewed the patient's current medications.  Assessment/Plan:  1. Squamous cell carcinoma of the anus. Staging PET scan 09/23/2013 of a hypermetabolic anal mass and hypermetabolic right inguinal/perineal nodes. Radiation and cycle 1 5-FU/mitomycin C. beginning 10/10/2013. 2. Question palpable inguinal/pubic lymphadenopathy. The suprapubic palpable lesions appeared hypermetabolic on the PET scan. 3. Rectal pain and bleeding secondary to #1. 4. Nausea predating chemotherapy. She continues to have mild intermittent nausea. 5. Thickening/hypermetabolism at the splenic flexure. Question second primary. 6. Mucositis secondary to chemotherapy. 7. Skin rash likely related to 5-fluorouracil. 8. Neutropenia and thrombocytopenia secondary to chemotherapy.  Disposition-she appears stable. She has persistent mouth ulcers making it difficult to maintain adequate hydration orally. She is taking in some fluids. Blood pressure is stable. She will receive IV fluids again today. We will also schedule her for IV fluids tentatively on 10/20/2013 and 10/21/2013.  She has progressive neutropenia. She will begin Levaquin 500 mg daily. She and her daughter understand to contact the office with fever, chills, other signs of infection. We will obtain a followup CBC on 10/21/2013.  We will go ahead and schedule her for cycle 2 5-FU/mitomycin C. beginning 11/07/2013. The chemotherapy will be dose reduced with cycle 2 due to the mucositis following cycle 1. We will refer her for PICC line placement prior to chemotherapy on 11/07/2013.  We will see her in followup on 11/04/2013 or sooner if needed.  Plan reviewed with Dr. Truett Perna. Lonna Cobb ANP/GNP-BC

## 2013-10-19 NOTE — Patient Instructions (Signed)
Dehydration, Adult Dehydration is when you lose more fluids from the body than you take in. Vital organs like the kidneys, brain, and heart cannot function without a proper amount of fluids and salt. Any loss of fluids from the body can cause dehydration.  CAUSES   Vomiting.  Diarrhea.  Excessive sweating.  Excessive urine output.  Fever. SYMPTOMS  Mild dehydration  Thirst.  Dry lips.  Slightly dry mouth. Moderate dehydration  Very dry mouth.  Sunken eyes.  Skin does not bounce back quickly when lightly pinched and released.  Dark urine and decreased urine production.  Decreased tear production.  Headache. Severe dehydration  Very dry mouth.  Extreme thirst.  Rapid, weak pulse (more than 100 beats per minute at rest).  Cold hands and feet.  Not able to sweat in spite of heat and temperature.  Rapid breathing.  Blue lips.  Confusion and lethargy.  Difficulty being awakened.  Minimal urine production.  No tears. DIAGNOSIS  Your caregiver will diagnose dehydration based on your symptoms and your exam. Blood and urine tests will help confirm the diagnosis. The diagnostic evaluation should also identify the cause of dehydration. TREATMENT  Treatment of mild or moderate dehydration can often be done at home by increasing the amount of fluids that you drink. It is best to drink small amounts of fluid more often. Drinking too much at one time can make vomiting worse. Refer to the home care instructions below. Severe dehydration needs to be treated at the hospital where you will probably be given intravenous (IV) fluids that contain water and electrolytes. HOME CARE INSTRUCTIONS   Ask your caregiver about specific rehydration instructions.  Drink enough fluids to keep your urine clear or pale yellow.  Drink small amounts frequently if you have nausea and vomiting.  Eat as you normally do.  Avoid:  Foods or drinks high in sugar.  Carbonated  drinks.  Juice.  Extremely hot or cold fluids.  Drinks with caffeine.  Fatty, greasy foods.  Alcohol.  Tobacco.  Overeating.  Gelatin desserts.  Wash your hands well to avoid spreading bacteria and viruses.  Only take over-the-counter or prescription medicines for pain, discomfort, or fever as directed by your caregiver.  Ask your caregiver if you should continue all prescribed and over-the-counter medicines.  Keep all follow-up appointments with your caregiver. SEEK MEDICAL CARE IF:  You have abdominal pain and it increases or stays in one area (localizes).  You have a rash, stiff neck, or severe headache.  You are irritable, sleepy, or difficult to awaken.  You are weak, dizzy, or extremely thirsty. SEEK IMMEDIATE MEDICAL CARE IF:   You are unable to keep fluids down or you get worse despite treatment.  You have frequent episodes of vomiting or diarrhea.  You have blood or green matter (bile) in your vomit.  You have blood in your stool or your stool looks black and tarry.  You have not urinated in 6 to 8 hours, or you have only urinated a small amount of very dark urine.  You have a fever.  You faint. MAKE SURE YOU:   Understand these instructions.  Will watch your condition.  Will get help right away if you are not doing well or get worse. Document Released: 12/08/2005 Document Revised: 03/01/2012 Document Reviewed: 07/28/2011 Exit[employer] Patient Information 2014 [employer].  

## 2013-10-20 ENCOUNTER — Ambulatory Visit (HOSPITAL_BASED_OUTPATIENT_CLINIC_OR_DEPARTMENT_OTHER): Payer: Medicare Other

## 2013-10-20 ENCOUNTER — Other Ambulatory Visit: Payer: Self-pay | Admitting: *Deleted

## 2013-10-20 ENCOUNTER — Ambulatory Visit
Admission: RE | Admit: 2013-10-20 | Discharge: 2013-10-20 | Disposition: A | Discharge status: Home / Self Care | Payer: Medicare Other | Source: Ambulatory Visit | Attending: Radiation Oncology | Admitting: Radiation Oncology

## 2013-10-20 ENCOUNTER — Telehealth: Payer: Self-pay | Admitting: *Deleted

## 2013-10-20 VITALS — BP 100/73 | HR 80 | Temp 98.0°F | Resp 20

## 2013-10-20 DIAGNOSIS — C210 Malignant neoplasm of anus, unspecified: Secondary | ICD-10-CM

## 2013-10-20 DIAGNOSIS — K1231 Oral mucositis (ulcerative) due to antineoplastic therapy: Secondary | ICD-10-CM

## 2013-10-20 MED ORDER — SODIUM CHLORIDE 0.9 % IV SOLN
INTRAVENOUS | Status: AC
  Administered 2013-10-20: 14:00:00 via INTRAVENOUS

## 2013-10-20 NOTE — Patient Instructions (Signed)
Dehydration, Adult  Dehydration means your body does not have as much fluid as it needs. Your kidneys, brain, and heart will not work properly without the right amount of fluids and salt.   HOME CARE   Ask your doctor how to replace body fluid losses (rehydrate).   Drink enough fluids to keep your pee (urine) clear or pale yellow.   Drink small amounts of fluids often if you feel sick to your stomach (nauseous) or throw up (vomit).   Eat like you normally do.   Avoid:   Foods or drinks high in sugar.   Bubbly (carbonated) drinks.   Juice.   Very hot or cold fluids.   Drinks with caffeine.   Fatty, greasy foods.   Alcohol.   Tobacco.   Eating too much.   Gelatin desserts.   Wash your hands to avoid spreading germs (bacteria, viruses).   Only take medicine as told by your doctor.   Keep all doctor visits as told.  GET HELP RIGHT AWAY IF:    You cannot drink something without throwing up.   You get worse even with treatment.   Your vomit has blood in it or looks greenish.   Your poop (stool) has blood in it or looks black and tarry.   You have not peed in 6 to 8 hours.   You pee a small amount of very dark pee.   You have a fever.   You pass out (faint).   You have belly (abdominal) pain that gets worse or stays in one spot (localizes).   You have a rash, stiff neck, or bad headache.   You get easily annoyed, sleepy, or are hard to wake up.   You feel weak, dizzy, or very thirsty.  MAKE SURE YOU:    Understand these instructions.   Will watch your condition.   Will get help right away if you are not doing well or get worse.  Document Released: 10/04/2009 Document Revised: 03/01/2012 Document Reviewed: 07/28/2011  [employer] Patient Information 2014 [employer].

## 2013-10-20 NOTE — Telephone Encounter (Signed)
Daughter Cindy [name] called for Cindy [name] asking if she is getting IVF's today and what time, checked notes from Cindy [name], she tentatively scheduled IVF's for today and tomorrw, transferred call to Dr.Sherrill's nurse, and left voice message for her to call patient or daughter , her phone number is [phone], her rad tx today is at 235pm, will in basket  Nurse as well, daughter thanked this RN and will wait to hear from Dr. Kalman Drape nurse 10:30 AM

## 2013-10-20 NOTE — Progress Notes (Signed)
Called daughter with appointment for IVF today at 2 pm per charge nurse

## 2013-10-21 ENCOUNTER — Other Ambulatory Visit: Payer: Self-pay | Admitting: *Deleted

## 2013-10-21 ENCOUNTER — Other Ambulatory Visit (HOSPITAL_BASED_OUTPATIENT_CLINIC_OR_DEPARTMENT_OTHER): Payer: Medicare Other | Admitting: Lab

## 2013-10-21 ENCOUNTER — Ambulatory Visit
Admission: RE | Admit: 2013-10-21 | Discharge: 2013-10-21 | Disposition: A | Discharge status: Home / Self Care | Payer: Medicare Other | Source: Ambulatory Visit | Attending: Radiation Oncology | Admitting: Radiation Oncology

## 2013-10-21 ENCOUNTER — Ambulatory Visit (HOSPITAL_BASED_OUTPATIENT_CLINIC_OR_DEPARTMENT_OTHER): Payer: Medicare Other

## 2013-10-21 ENCOUNTER — Ambulatory Visit: Admission: RE | Admit: 2013-10-21 | Payer: Medicare Other | Source: Ambulatory Visit

## 2013-10-21 VITALS — BP 125/71 | HR 65 | Temp 98.4°F

## 2013-10-21 DIAGNOSIS — C210 Malignant neoplasm of anus, unspecified: Secondary | ICD-10-CM

## 2013-10-21 DIAGNOSIS — K1231 Oral mucositis (ulcerative) due to antineoplastic therapy: Secondary | ICD-10-CM

## 2013-10-21 LAB — CBC WITH DIFFERENTIAL/PLATELET
Basophils Absolute: 0 10*3/uL (ref 0.0–0.1)
EOS%: 5.9 % (ref 0.0–7.0)
Eosinophils Absolute: 0 10*3/uL (ref 0.0–0.5)
HCT: 28.7 % — ABNORMAL LOW (ref 34.8–46.6)
HGB: 10.1 g/dL — ABNORMAL LOW (ref 11.6–15.9)
MCH: 30.6 pg (ref 25.1–34.0)
MCV: 87 fL (ref 79.5–101.0)
MONO%: 11.8 % (ref 0.0–14.0)
NEUT#: 0.1 10*3/uL — CL (ref 1.5–6.5)
NEUT%: 26.4 % — ABNORMAL LOW (ref 38.4–76.8)
lymph#: 0.2 10*3/uL — ABNORMAL LOW (ref 0.9–3.3)

## 2013-10-21 MED ORDER — SODIUM CHLORIDE 0.9 % IV SOLN
INTRAVENOUS | Status: AC
  Administered 2013-10-21: 16:00:00 via INTRAVENOUS

## 2013-10-21 NOTE — Progress Notes (Signed)
No treat today per MD, after Hinton Rao called patient wbc=0.3,neutrophiol absoulte=0.1,plts=37, called susna back and said hshe is still in lobby ,she will let pateint know no treat today, will recheck on Monday per MD [facility]

## 2013-10-21 NOTE — Progress Notes (Signed)
Reviewed today's counts with radiation oncology dept.-tx will be held today. V/S stable today-already on Levaquin 500 mg daily. Still not drinking enough fluid at home and eating due to stomatitis. Reviewed with Dr. Cyndie Chime. Will repeat IVF today and recheck counts on Monday. Reviewed neutropenic and bleeding precautions with daughter and patient.

## 2013-10-21 NOTE — Progress Notes (Signed)
Spoke with pt in treatment area. She reports she has not been able to drink PO fluids due to mouth sores. Pt given appt  for IV fluids on 10/22/13. Pt stated she does not want to come in. Strongly suggested she keep appt if unable to drink fluids overnight. She and daughter voiced understanding.

## 2013-10-21 NOTE — Patient Instructions (Signed)
Dehydration, Adult Dehydration is when you lose more fluids from the body than you take in. Vital organs like the kidneys, brain, and heart cannot function without a proper amount of fluids and salt. Any loss of fluids from the body can cause dehydration.  CAUSES   Vomiting.  Diarrhea.  Excessive sweating.  Excessive urine output.  Fever. SYMPTOMS  Mild dehydration  Thirst.  Dry lips.  Slightly dry mouth. Moderate dehydration  Very dry mouth.  Sunken eyes.  Skin does not bounce back quickly when lightly pinched and released.  Dark urine and decreased urine production.  Decreased tear production.  Headache. Severe dehydration  Very dry mouth.  Extreme thirst.  Rapid, weak pulse (more than 100 beats per minute at rest).  Cold hands and feet.  Not able to sweat in spite of heat and temperature.  Rapid breathing.  Blue lips.  Confusion and lethargy.  Difficulty being awakened.  Minimal urine production.  No tears. DIAGNOSIS  Your caregiver will diagnose dehydration based on your symptoms and your exam. Blood and urine tests will help confirm the diagnosis. The diagnostic evaluation should also identify the cause of dehydration. TREATMENT  Treatment of mild or moderate dehydration can often be done at home by increasing the amount of fluids that you drink. It is best to drink small amounts of fluid more often. Drinking too much at one time can make vomiting worse. Refer to the home care instructions below. Severe dehydration needs to be treated at the hospital where you will probably be given intravenous (IV) fluids that contain water and electrolytes. HOME CARE INSTRUCTIONS   Ask your caregiver about specific rehydration instructions.  Drink enough fluids to keep your urine clear or pale yellow.  Drink small amounts frequently if you have nausea and vomiting.  Eat as you normally do.  Avoid:  Foods or drinks high in sugar.  Carbonated  drinks.  Juice.  Extremely hot or cold fluids.  Drinks with caffeine.  Fatty, greasy foods.  Alcohol.  Tobacco.  Overeating.  Gelatin desserts.  Wash your hands well to avoid spreading bacteria and viruses.  Only take over-the-counter or prescription medicines for pain, discomfort, or fever as directed by your caregiver.  Ask your caregiver if you should continue all prescribed and over-the-counter medicines.  Keep all follow-up appointments with your caregiver. SEEK MEDICAL CARE IF:  You have abdominal pain and it increases or stays in one area (localizes).  You have a rash, stiff neck, or severe headache.  You are irritable, sleepy, or difficult to awaken.  You are weak, dizzy, or extremely thirsty. SEEK IMMEDIATE MEDICAL CARE IF:   You are unable to keep fluids down or you get worse despite treatment.  You have frequent episodes of vomiting or diarrhea.  You have blood or green matter (bile) in your vomit.  You have blood in your stool or your stool looks black and tarry.  You have not urinated in 6 to 8 hours, or you have only urinated a small amount of very dark urine.  You have a fever.  You faint. MAKE SURE YOU:   Understand these instructions.  Will watch your condition.  Will get help right away if you are not doing well or get worse. Document Released: 12/08/2005 Document Revised: 03/01/2012 Document Reviewed: 07/28/2011 Exit[employer] Patient Information 2014 [employer].  

## 2013-10-22 ENCOUNTER — Ambulatory Visit: Payer: Medicare Other

## 2013-10-23 ENCOUNTER — Encounter (HOSPITAL_COMMUNITY): Payer: Self-pay | Admitting: Emergency Medicine

## 2013-10-23 ENCOUNTER — Emergency Department (HOSPITAL_COMMUNITY): Payer: Medicare Other

## 2013-10-23 ENCOUNTER — Inpatient Hospital Stay (HOSPITAL_COMMUNITY)
Admission: EM | Admit: 2013-10-23 | Discharge: 2013-10-27 | DRG: 808 | Disposition: A | Discharge status: Home Health | Payer: Medicare Other | Attending: Internal Medicine | Admitting: Internal Medicine

## 2013-10-23 DIAGNOSIS — R5383 Other fatigue: Secondary | ICD-10-CM

## 2013-10-23 DIAGNOSIS — C210 Malignant neoplasm of anus, unspecified: Secondary | ICD-10-CM | POA: Diagnosis not present

## 2013-10-23 DIAGNOSIS — R197 Diarrhea, unspecified: Secondary | ICD-10-CM | POA: Diagnosis not present

## 2013-10-23 DIAGNOSIS — T451X5A Adverse effect of antineoplastic and immunosuppressive drugs, initial encounter: Principal | ICD-10-CM | POA: Diagnosis present

## 2013-10-23 DIAGNOSIS — E43 Unspecified severe protein-calorie malnutrition: Secondary | ICD-10-CM | POA: Diagnosis present

## 2013-10-23 DIAGNOSIS — K625 Hemorrhage of anus and rectum: Secondary | ICD-10-CM | POA: Diagnosis not present

## 2013-10-23 DIAGNOSIS — K5289 Other specified noninfective gastroenteritis and colitis: Secondary | ICD-10-CM | POA: Diagnosis present

## 2013-10-23 DIAGNOSIS — Z9221 Personal history of antineoplastic chemotherapy: Secondary | ICD-10-CM | POA: Diagnosis not present

## 2013-10-23 DIAGNOSIS — R509 Fever, unspecified: Secondary | ICD-10-CM | POA: Diagnosis not present

## 2013-10-23 DIAGNOSIS — E869 Volume depletion, unspecified: Secondary | ICD-10-CM | POA: Diagnosis present

## 2013-10-23 DIAGNOSIS — J40 Bronchitis, not specified as acute or chronic: Secondary | ICD-10-CM | POA: Diagnosis not present

## 2013-10-23 DIAGNOSIS — C20 Malignant neoplasm of rectum: Secondary | ICD-10-CM | POA: Diagnosis present

## 2013-10-23 DIAGNOSIS — L270 Generalized skin eruption due to drugs and medicaments taken internally: Secondary | ICD-10-CM | POA: Diagnosis present

## 2013-10-23 DIAGNOSIS — E039 Hypothyroidism, unspecified: Secondary | ICD-10-CM | POA: Diagnosis present

## 2013-10-23 DIAGNOSIS — E871 Hypo-osmolality and hyponatremia: Secondary | ICD-10-CM | POA: Diagnosis not present

## 2013-10-23 DIAGNOSIS — D61810 Antineoplastic chemotherapy induced pancytopenia: Secondary | ICD-10-CM | POA: Diagnosis not present

## 2013-10-23 DIAGNOSIS — E876 Hypokalemia: Secondary | ICD-10-CM | POA: Diagnosis not present

## 2013-10-23 DIAGNOSIS — K1231 Oral mucositis (ulcerative) due to antineoplastic therapy: Secondary | ICD-10-CM | POA: Diagnosis present

## 2013-10-23 DIAGNOSIS — R5081 Fever presenting with conditions classified elsewhere: Secondary | ICD-10-CM | POA: Diagnosis present

## 2013-10-23 DIAGNOSIS — Z923 Personal history of irradiation: Secondary | ICD-10-CM

## 2013-10-23 DIAGNOSIS — D696 Thrombocytopenia, unspecified: Secondary | ICD-10-CM | POA: Diagnosis present

## 2013-10-23 DIAGNOSIS — R5381 Other malaise: Secondary | ICD-10-CM | POA: Diagnosis not present

## 2013-10-23 DIAGNOSIS — K1230 Oral mucositis (ulcerative), unspecified: Secondary | ICD-10-CM | POA: Diagnosis present

## 2013-10-23 DIAGNOSIS — R6889 Other general symptoms and signs: Secondary | ICD-10-CM | POA: Diagnosis not present

## 2013-10-23 DIAGNOSIS — D709 Neutropenia, unspecified: Secondary | ICD-10-CM | POA: Diagnosis not present

## 2013-10-23 DIAGNOSIS — K1370 Unspecified lesions of oral mucosa: Secondary | ICD-10-CM | POA: Diagnosis not present

## 2013-10-23 HISTORY — DX: Neutropenia, unspecified: D70.9

## 2013-10-23 LAB — CBC WITH DIFFERENTIAL/PLATELET
Basophils Relative: 7 % — ABNORMAL HIGH (ref 0–1)
Eosinophils Absolute: 0 10*3/uL (ref 0.0–0.7)
Eosinophils Relative: 0 % (ref 0–5)
HCT: 27.6 % — ABNORMAL LOW (ref 36.0–46.0)
Hemoglobin: 9.9 g/dL — ABNORMAL LOW (ref 12.0–15.0)
Lymphocytes Relative: 8 % — ABNORMAL LOW (ref 12–46)
Lymphs Abs: 0 10*3/uL — ABNORMAL LOW (ref 0.7–4.0)
Monocytes Absolute: 0.1 10*3/uL (ref 0.1–1.0)
Monocytes Relative: 71 % — ABNORMAL HIGH (ref 3–12)
Neutro Abs: 0 10*3/uL — ABNORMAL LOW (ref 1.7–7.7)
Neutrophils Relative %: 14 % — ABNORMAL LOW (ref 43–77)
RBC: 3.2 MIL/uL — ABNORMAL LOW (ref 3.87–5.11)

## 2013-10-23 LAB — BASIC METABOLIC PANEL
CO2: 24 mEq/L (ref 19–32)
Calcium: 8.7 mg/dL (ref 8.4–10.5)
Chloride: 91 mEq/L — ABNORMAL LOW (ref 96–112)
Creatinine, Ser: 0.57 mg/dL (ref 0.50–1.10)
Glucose, Bld: 140 mg/dL — ABNORMAL HIGH (ref 70–99)
Potassium: 2.7 mEq/L — CL (ref 3.5–5.1)
Sodium: 126 mEq/L — ABNORMAL LOW (ref 135–145)

## 2013-10-23 LAB — HEPATIC FUNCTION PANEL
AST: 20 U/L (ref 0–37)
Albumin: 2.7 g/dL — ABNORMAL LOW (ref 3.5–5.2)
Alkaline Phosphatase: 53 U/L (ref 39–117)
Indirect Bilirubin: 0.3 mg/dL (ref 0.3–0.9)
Total Protein: 6.5 g/dL (ref 6.0–8.3)

## 2013-10-23 LAB — URINALYSIS, ROUTINE W REFLEX MICROSCOPIC
Glucose, UA: NEGATIVE mg/dL
Ketones, ur: NEGATIVE mg/dL
Leukocytes, UA: NEGATIVE
Specific Gravity, Urine: 1.017 (ref 1.005–1.030)
pH: 7 (ref 5.0–8.0)

## 2013-10-23 LAB — URINE MICROSCOPIC-ADD ON

## 2013-10-23 MED ORDER — FILGRASTIM 300 MCG/ML IJ SOLN
300.0000 ug | Freq: Every day | INTRAMUSCULAR | Status: DC
  Administered 2013-10-23 – 2013-10-24 (×2): 300 ug via SUBCUTANEOUS
  Filled 2013-10-23 (×3): qty 1

## 2013-10-23 MED ORDER — ZOLPIDEM TARTRATE 5 MG PO TABS
5.0000 mg | ORAL_TABLET | Freq: Every evening | ORAL | Status: DC | PRN
  Administered 2013-10-23 – 2013-10-26 (×4): 5 mg via ORAL
  Filled 2013-10-23 (×4): qty 1

## 2013-10-23 MED ORDER — POTASSIUM CHLORIDE 20 MEQ/15ML (10%) PO LIQD
20.0000 meq | Freq: Once | ORAL | Status: AC
  Administered 2013-10-23: 20 meq via ORAL
  Filled 2013-10-23: qty 15

## 2013-10-23 MED ORDER — FLUOXETINE HCL 20 MG PO CAPS
40.0000 mg | ORAL_CAPSULE | Freq: Every morning | ORAL | Status: DC
  Administered 2013-10-23 – 2013-10-27 (×5): 40 mg via ORAL
  Filled 2013-10-23 (×5): qty 2

## 2013-10-23 MED ORDER — SODIUM CHLORIDE 0.9 % IV SOLN
250.0000 mg | Freq: Four times a day (QID) | INTRAVENOUS | Status: DC
  Administered 2013-10-23 – 2013-10-27 (×15): 250 mg via INTRAVENOUS
  Filled 2013-10-23 (×17): qty 250

## 2013-10-23 MED ORDER — ONDANSETRON HCL 4 MG/2ML IJ SOLN
4.0000 mg | Freq: Once | INTRAMUSCULAR | Status: AC
  Administered 2013-10-23: 4 mg via INTRAVENOUS
  Filled 2013-10-23: qty 2

## 2013-10-23 MED ORDER — HYDROCODONE-ACETAMINOPHEN 5-325 MG PO TABS
1.0000 | ORAL_TABLET | ORAL | Status: DC | PRN
  Administered 2013-10-23 (×2): 1 via ORAL
  Filled 2013-10-23 (×2): qty 1

## 2013-10-23 MED ORDER — VANCOMYCIN HCL IN DEXTROSE 1-5 GM/200ML-% IV SOLN
1000.0000 mg | INTRAVENOUS | Status: AC
  Administered 2013-10-23: 1000 mg via INTRAVENOUS
  Filled 2013-10-23: qty 200

## 2013-10-23 MED ORDER — LEVOTHYROXINE SODIUM 100 MCG PO TABS
100.0000 ug | ORAL_TABLET | Freq: Every day | ORAL | Status: DC
  Administered 2013-10-24 – 2013-10-27 (×4): 100 ug via ORAL
  Filled 2013-10-23 (×6): qty 1

## 2013-10-23 MED ORDER — ACETAMINOPHEN 500 MG PO TABS: 1000.0000 mg | ORAL_TABLET | Freq: Once | ORAL | Status: AC

## 2013-10-23 MED ORDER — FILGRASTIM 480 MCG/1.6ML IJ SOLN: 330.0000 ug | Freq: Every day | INTRAMUSCULAR | Status: DC

## 2013-10-23 MED ORDER — SODIUM CHLORIDE 0.9 % IV BOLUS (SEPSIS)
1000.0000 mL | Freq: Once | INTRAVENOUS | Status: AC
  Administered 2013-10-23: 1000 mL via INTRAVENOUS

## 2013-10-23 MED ORDER — VANCOMYCIN HCL 500 MG IV SOLR
500.0000 mg | Freq: Two times a day (BID) | INTRAVENOUS | Status: DC
  Administered 2013-10-23 – 2013-10-26 (×7): 500 mg via INTRAVENOUS
  Filled 2013-10-23 (×10): qty 500

## 2013-10-23 MED ORDER — POTASSIUM CHLORIDE CRYS ER 20 MEQ PO TBCR
40.0000 meq | EXTENDED_RELEASE_TABLET | Freq: Once | ORAL | Status: AC
  Administered 2013-10-23: 40 meq via ORAL
  Filled 2013-10-23: qty 2

## 2013-10-23 MED ORDER — ACETAMINOPHEN 650 MG RE SUPP: 650.0000 mg | Freq: Four times a day (QID) | RECTAL | Status: DC | PRN

## 2013-10-23 MED ORDER — TROLAMINE SALICYLATE 10 % EX CREA: 1.0000 "application " | TOPICAL_CREAM | CUTANEOUS | Status: DC | PRN

## 2013-10-23 MED ORDER — SODIUM CHLORIDE 0.9 % IV SOLN
500.0000 mg | Freq: Three times a day (TID) | INTRAVENOUS | Status: DC
  Filled 2013-10-23: qty 500

## 2013-10-23 MED ORDER — ONDANSETRON HCL 4 MG/2ML IJ SOLN: 4.0000 mg | Freq: Four times a day (QID) | INTRAMUSCULAR | Status: DC | PRN

## 2013-10-23 MED ORDER — ALPRAZOLAM 0.25 MG PO TABS
0.2500 mg | ORAL_TABLET | Freq: Three times a day (TID) | ORAL | Status: DC
  Administered 2013-10-23 – 2013-10-27 (×13): 0.25 mg via ORAL
  Filled 2013-10-23 (×13): qty 1

## 2013-10-23 MED ORDER — ACETAMINOPHEN 325 MG PO TABS
650.0000 mg | ORAL_TABLET | Freq: Four times a day (QID) | ORAL | Status: DC | PRN
Start: 2013-10-23 — End: 2013-10-27
  Filled 2013-10-23: qty 2

## 2013-10-23 MED ORDER — IMIPENEM-CILASTATIN 500 MG IV SOLR
500.0000 mg | Freq: Once | INTRAVENOUS | Status: AC
  Administered 2013-10-23: 500 mg via INTRAVENOUS
  Filled 2013-10-23: qty 500

## 2013-10-23 MED ORDER — SODIUM CHLORIDE 0.9 % IV SOLN
INTRAVENOUS | Status: DC
  Administered 2013-10-23 – 2013-10-27 (×6): via INTRAVENOUS
  Filled 2013-10-23 (×9): qty 1000

## 2013-10-23 MED ORDER — MAGIC MOUTHWASH W/LIDOCAINE
10.0000 mL | Freq: Four times a day (QID) | ORAL | Status: DC | PRN
  Administered 2013-10-23 – 2013-10-25 (×6): 10 mL via ORAL
  Filled 2013-10-23 (×5): qty 10

## 2013-10-23 MED ORDER — ONDANSETRON HCL 4 MG PO TABS: 4.0000 mg | ORAL_TABLET | Freq: Four times a day (QID) | ORAL | Status: DC | PRN

## 2013-10-23 MED ORDER — POTASSIUM CHLORIDE 10 MEQ/100ML IV SOLN
10.0000 meq | INTRAVENOUS | Status: AC
  Administered 2013-10-23 (×4): 10 meq via INTRAVENOUS
  Filled 2013-10-23 (×4): qty 100

## 2013-10-23 NOTE — ED Provider Notes (Signed)
CSN: [id number]     Arrival date & time 10/23/13  9562 History   First MD Initiated Contact with Patient 10/23/13 0740     Chief Complaint  Patient presents with  . Fever   (Consider location/radiation/quality/duration/timing/severity/associated sxs/prior Treatment) HPI Comments: 79 year old female with rectal cancer who is on both chemotherapy and radiation presents with weakness, fatigue, and fevers. States that she's been feeling weak all week. She states over the past couple days but have a waxing and waning fever. States that he was "all day yesterday". Highest temperature was 100.9. She states that is told she was neutropenic a couple days ago and was also unable to get chemotherapy. She's not have any indwelling lines patient has a headache, cough, abdominal pain, chest pain, shortness of breath, or dysuria. She does have diarrhea but that is not new. No blood in her stools. She states she's been having a sore throat recently.   Past Medical History  Diagnosis Date  . PONV (postoperative nausea and vomiting)   . Hypertension   . Hyperlipemia   . Hypothyroidism   . GERD (gastroesophageal reflux disease)   . Arthritis   . Depression   . Deaf, left   . Cancer     skin  . Colon cancer 08/31/13    invasive squamous cell  . Skin cancer    Past Surgical History  Procedure Laterality Date  . Abdominal hysterectomy    . Cholecystectomy    . Tonsillectomy    . Appendectomy    . Thyroidectomy, partial    . Ercp    . Colonoscopy  08/31/13    invasive squamous cell colon  . Eye surgery      cataracts  . Knee arthroscopy  05/11/2012    Procedure: ARTHROSCOPY KNEE;  Surgeon: Velna Ochs, MD;  Location: [facility];  Service: Orthopedics;  Laterality: Right;  left knee medial menisectomy and chondroplasty  . Esophagogastroduodenoscopy  10/20/2012    Procedure: ESOPHAGOGASTRODUODENOSCOPY (EGD);  Surgeon: Willis Modena, MD;  Location: [facility];  Service: Endoscopy;   Laterality: Left;  . Cesarean section      2   Family History  Problem Relation Age of Onset  . Kidney disease Mother   . Heart attack Father   . Heart disease Father   . Cancer Sister     breast  . Cancer Daughter     breast  . Cancer Sister     ovarian  . Cancer Sister     melanoma   History  Substance Use Topics  . Smoking status: Never Smoker   . Smokeless tobacco: Never Used  . Alcohol Use: No   OB History   Grav Para Term Preterm Abortions TAB SAB Ect Mult Living                 Review of Systems  Constitutional: Positive for fatigue.  Respiratory: Negative for shortness of breath.   Gastrointestinal: Negative for abdominal pain.  Neurological: Positive for weakness.  All other systems reviewed and are negative.    Allergies  Morphine and related  Home Medications   Current Outpatient Rx  Name  Route  Sig  Dispense  Refill  . ALPRAZolam (XANAX) 0.25 MG tablet   Oral   Take 1 tablet (0.25 mg total) by mouth 3 (three) times daily.   60 tablet   0   . amLODipine (NORVASC) 5 MG tablet   Oral   Take 5 mg by mouth every morning.          Marland Kitchen  estrogens, conjugated, (PREMARIN) 0.625 MG tablet   Oral   Take 0.625 mg by mouth daily.          Marland Kitchen FLUoxetine (PROZAC) 40 MG capsule   Oral   Take 40 mg by mouth every morning.          Marland Kitchen HYDROcodone-acetaminophen (NORCO/VICODIN) 5-325 MG per tablet   Oral   Take 1 tablet by mouth every 4 (four) hours as needed for pain.   60 tablet   0   . levofloxacin (LEVAQUIN) 500 MG tablet   Oral   Take 1 tablet (500 mg total) by mouth daily.   5 tablet   0   . levothyroxine (SYNTHROID, LEVOTHROID) 100 MCG tablet   Oral   Take 100 mcg by mouth every morning.          . lidocaine (XYLOCAINE) 5 % ointment   Topical   Apply topically as needed.   35.44 g   3   . ondansetron (ZOFRAN) 8 MG tablet   Oral   Take 1 tablet (8 mg total) by mouth every 8 (eight) hours as needed for nausea.   20 tablet    1   . prochlorperazine (COMPAZINE) 5 MG tablet      TAKE 1 TABLET BY MOUTH EVERY 8 HOURS AS NEEDED FOR NAUSEA.   30 tablet   1   . telmisartan (MICARDIS) 80 MG tablet   Oral   Take 80 mg by mouth every morning.          . traMADol (ULTRAM) 50 MG tablet   Oral   Take 1 tablet (50 mg total) by mouth every 6 (six) hours as needed for pain.   30 tablet   1   . trolamine salicylate (ASPERCREME) 10 % cream   Topical   Apply 1 application topically as needed (pain).         Marland Kitchen zolpidem (AMBIEN) 10 MG tablet   Oral   Take 10 mg by mouth at bedtime as needed for sleep.          BP 149/65  Pulse 86  Temp(Src) 100.3 F (37.9 C) (Oral)  Resp 18  SpO2 97% Physical Exam  Nursing note and vitals reviewed. Constitutional: She is oriented to person, place, and time. She appears well-developed and well-nourished. No distress.  HENT:  Head: Normocephalic and atraumatic.  Right Ear: External ear normal.  Left Ear: External ear normal.  Nose: Nose normal.  Mouth/Throat: No oropharyngeal exudate.  Eyes: Right eye exhibits no discharge. Left eye exhibits no discharge.  Cardiovascular: Normal rate, regular rhythm and normal heart sounds.   Pulmonary/Chest: Effort normal and breath sounds normal. She has no rales.  Abdominal: Soft. There is no tenderness.  Neurological: She is alert and oriented to person, place, and time.  Skin: Skin is warm and dry.    ED Course  Procedures (including critical care time) Labs Review Labs Reviewed  CBC WITH DIFFERENTIAL - Abnormal; Notable for the following:    WBC 0.1 (*)    RBC 3.20 (*)    Hemoglobin 9.9 (*)    HCT 27.6 (*)    RDW 11.3 (*)    Platelets 13 (*)    Neutrophils Relative % 14 (*)    Lymphocytes Relative 8 (*)    Monocytes Relative 71 (*)    Basophils Relative 7 (*)    Neutro Abs 0.0 (*)    Lymphs Abs 0.0 (*)    All other components within  normal limits  BASIC METABOLIC PANEL - Abnormal; Notable for the following:     Sodium 126 (*)    Potassium 2.7 (*)    Chloride 91 (*)    Glucose, Bld 140 (*)    BUN 5 (*)    GFR calc non Af Amer 85 (*)    All other components within normal limits  HEPATIC FUNCTION PANEL - Abnormal; Notable for the following:    Albumin 2.7 (*)    All other components within normal limits  URINALYSIS, ROUTINE W REFLEX MICROSCOPIC - Abnormal; Notable for the following:    Hgb urine dipstick TRACE (*)    Protein, ur 30 (*)    All other components within normal limits  CULTURE, BLOOD (ROUTINE X 2)  CULTURE, BLOOD (ROUTINE X 2)  URINE MICROSCOPIC-ADD ON   Imaging Review Dg Chest 2 View  10/23/2013   CLINICAL DATA:  Fever, weakness, hypertension.  EXAM: CHEST  2 VIEW  COMPARISON:  10/17/2012  FINDINGS: The heart size is normal. There is mild perihilar bronchial thickening. No focal consolidations or pleural effusions. No pulmonary edema.Surgical clips are identified in the upper abdomen. There is mild thoracic degenerative change.  IMPRESSION: 1. Bronchitic changes. 2.  No evidence for acute cardiopulmonary abnormality.   Electronically Signed   By: Rosalie Gums M.D.   On: 10/23/2013 09:33    EKG Interpretation   None       MDM   1. Neutropenia   2. Hypokalemia    Patient with neutropenia and fevers at home. Afebrile here. Cultures taken, and will replace K. Discussed with hospitalist, will admit. I asked which abx he prefers and he will decide after he sees her.     Audree Camel, MD 10/23/13 [phone]

## 2013-10-23 NOTE — H&P (Signed)
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 The patient has also been complaining of generalized weakness. She last received chemotherapy on 10/15/2013, and her last radiation therapy was 10/20/2013. The patient also complains of a nonproductive cough for the past 2-3 days without any hemoptysis. She has some nausea without any vomiting. She denies any headache, visual disturbance, chest discomfort, shortness of breath, vomiting, abdominal pain, dysuria, hematuria. She complains of a sore throat for the past 2-3 days. She has had mucositis in her mouth from her chemotherapy. There is no hematochezia or melena, but she complains of loose stools. Patient has been on levofloxacin since 10/19/2013 for empiric coverage.  In the emergency department, the patient was found to have WBC 0.1, platelets 13,000, sodium 126, potassium 2.7. Her hepatic panel was unremarkable. Chest x-ray showed mild perihilar bronchitic changes. Assessment/Plan: Febrile neutropenia -Blood cultures x2 sets have been obtained -UA with reflex to urine culture -Empiric vancomycin and imipenem -Neupogen 76mcg/kg daily -ANC= 14 -C. difficile PCR -suspect source may be pulmonary given "bronchitic changes" on CXR Squamous cell carcinoma of the rectum -Last chemotherapy 10/15/2013 -Last XRT on 10/20/13 -[facility] has been notified of pt's admission Diarrhea - C. Diff PCR -currently on levofloxacin for empiric coverage since 10/29 Hyponatremia -Likely volume depletion -IV normal saline Hypokalemia -Secondary to loose stools -Replete -Check magnesium Mucositis -Magic mouthwash a AC       Past Medical History  Diagnosis Date  .  PONV (postoperative nausea and vomiting)   . Hypertension   . Hyperlipemia   . Hypothyroidism   . GERD (gastroesophageal reflux disease)   . Arthritis   . Depression   . Deaf, left   . Cancer     skin  . Colon cancer 08/31/13    invasive squamous cell  . Skin cancer    Past Surgical History  Procedure Laterality Date  . Abdominal hysterectomy    . Cholecystectomy    . Tonsillectomy    . Appendectomy    . Thyroidectomy, partial    . Ercp    . Colonoscopy  08/31/13    invasive squamous cell colon  . Eye surgery      cataracts  . Knee arthroscopy  05/11/2012    Procedure: ARTHROSCOPY KNEE;  Surgeon: Velna Ochs, MD;  Location: [facility];  Service: Orthopedics;  Laterality: Right;  left knee medial menisectomy and chondroplasty  . Esophagogastroduodenoscopy  10/20/2012    Procedure: ESOPHAGOGASTRODUODENOSCOPY (EGD);  Surgeon: Willis Modena, MD;  Location: [facility];  Service: Endoscopy;  Laterality: Left;  . Cesarean section      2   Social History:  reports that she has never smoked. She has never used smokeless tobacco. She reports that she does not drink alcohol or use illicit drugs.   Family History  Problem Relation Age of Onset  . Kidney disease Mother   . Heart attack Father   . Heart disease Father   . Cancer Sister     breast  . Cancer Daughter     breast  . Cancer Sister     ovarian  . Cancer Sister     melanoma     Allergies  Allergen Reactions  . Morphine And  Related Hives      Prior to Admission medications   Medication Sig Start Date End Date Taking? Authorizing Provider  ALPRAZolam (XANAX) 0.25 MG tablet Take 1 tablet (0.25 mg total) by mouth 3 (three) times daily. 10/13/13  Yes Jonna Coup, MD  estrogens, conjugated, (PREMARIN) 0.625 MG tablet Take 0.625 mg by mouth daily.    Yes Historical Provider, MD  FLUoxetine (PROZAC) 40 MG capsule Take 40 mg by mouth every morning.    Yes Historical Provider, MD   HYDROcodone-acetaminophen (NORCO/VICODIN) 5-325 MG per tablet Take 1 tablet by mouth every 4 (four) hours as needed for pain. 10/10/13  Yes Ladene Artist, MD  levofloxacin (LEVAQUIN) 500 MG tablet Take 1 tablet (500 mg total) by mouth daily. 10/19/13  Yes Rana Snare, NP  levothyroxine (SYNTHROID, LEVOTHROID) 100 MCG tablet Take 100 mcg by mouth every morning.    Yes Historical Provider, MD  lidocaine (XYLOCAINE) 5 % ointment Apply topically as needed. 09/09/13  Yes Romie Levee, MD  ondansetron (ZOFRAN) 8 MG tablet Take 1 tablet (8 mg total) by mouth every 8 (eight) hours as needed for nausea. 09/28/13  Yes Ladene Artist, MD  traMADol (ULTRAM) 50 MG tablet Take 1 tablet (50 mg total) by mouth every 6 (six) hours as needed for pain. 09/19/13  Yes Ladene Artist, MD  trolamine salicylate (ASPERCREME) 10 % cream Apply 1 application topically as needed (pain).   Yes Historical Provider, MD  zolpidem (AMBIEN) 10 MG tablet Take 5-10 mg by mouth at bedtime as needed for sleep.    Yes Historical Provider, MD    Review of Systems:  Constitutional:  No weight loss, night sweats Head&Eyes: No headache.  No vision loss.   ENT:  No Difficulty swallowing,Tooth/dental problems, No ear ache, post nasal drip,  Cardio-vascular:  No chest pain, Orthopnea, PND, swelling in lower extremities,  dizziness, palpitations  GI:  No  abdominal pain, nausea, vomiting, diarrhea, loss of appetite, hematochezia, melena, heartburn, indigestion, Resp:  No shortness of breath with exertion or at rest No coughing up of blood .No wheezing.No chest wall deformity  Skin:  no rash or lesions.  GU:  no dysuria, change in color of urine, no urgency or frequency. No flank pain.  Musculoskeletal:  No joint pain or swelling. No decreased range of motion. No back pain.  Psych:  No change in mood or affect.  Neurologic: No headache, no dysesthesia, no focal weakness, no vision loss. No syncope  Physical Exam: Filed  Vitals:   10/23/13 0747 10/23/13 0900  BP: 149/65   Pulse: 86   Temp: 100.3 F (37.9 C)   TempSrc: Oral   Resp: 18   Height:  5' 4.96" (1.65 m)  Weight:  66.5 kg (146 lb 9.7 oz)  SpO2: 97%    General:  A&O x 3, NAD, nontoxic, pleasant/cooperative Head/Eye: No conjunctival hemorrhage, no icterus, [place]/AT, No nystagmus ENT:  No icterus,  No thrush, edentulous, no pharyngeal exudate; scattered shallow ulcerations on the patient's gums and buccal mucosa Neck:  No masses, no lymphadenpathy, no bruits CV:  RRR, no rub, no gallop, no S3 Lung:  CTAB, good air movement, no wheeze, no rhonchi Abdomen: soft/NT, +BS, nondistended, no peritoneal signs Ext: No cyanosis, No rashes, No petechiae, No lymphangitis, No edema  Labs on Admission:  Basic Metabolic Panel:  Recent Labs Lab 10/18/13 1352 10/19/13 1439 10/23/13 0816  NA 123* 128* 126*  K 3.5 3.5 2.7*  CL  --   --  91*  CO2 21* 22 24  GLUCOSE 110 115 140*  BUN 11.8 12.3 5*  CREATININE 0.7 0.8 0.57  CALCIUM 9.0 9.0 8.7   Liver Function Tests:  Recent Labs Lab 10/18/13 1352 10/23/13 0816  AST 15 20  ALT 11 21  ALKPHOS 56 53  BILITOT 0.56 0.4  PROT 6.7 6.5  ALBUMIN 2.9* 2.7*   No results found for this basename: LIPASE, AMYLASE,  in the last 168 hours No results found for this basename: AMMONIA,  in the last 168 hours CBC:  Recent Labs Lab 10/18/13 1351 10/19/13 1325 10/21/13 1324 10/23/13 0816  WBC 1.1* 0.5* 0.3* 0.1*  NEUTROABS 0.8* 0.3* 0.1* 0.0*  HGB 10.6* 10.5* 10.1* 9.9*  HCT 30.6* 29.9* 28.7* 27.6*  MCV 89.7 87.7 87.0 86.3  PLT 132* 104* 37* 13*   Cardiac Enzymes: No results found for this basename: CKTOTAL, CKMB, CKMBINDEX, TROPONINI,  in the last 168 hours BNP: No components found with this basename: POCBNP,  CBG: No results found for this basename: GLUCAP,  in the last 168 hours  Radiological Exams on Admission: Dg Chest 2 View  10/23/2013   CLINICAL DATA:  Fever, weakness, hypertension.  EXAM:  CHEST  2 VIEW  COMPARISON:  10/17/2012  FINDINGS: The heart size is normal. There is mild perihilar bronchial thickening. No focal consolidations or pleural effusions. No pulmonary edema.Surgical clips are identified in the upper abdomen. There is mild thoracic degenerative change.  IMPRESSION: 1. Bronchitic changes. 2.  No evidence for acute cardiopulmonary abnormality.   Electronically Signed   By: Rosalie Gums M.D.   On: 10/23/2013 09:33        Time spent:60 minutes Code Status:   FULL Family Communication:   Daughter at bedside   Thedford Bunton, DO  [facility] Pager [phone]  If 7PM-7AM, please contact night-coverage [email or web address] Password [facility] 10/23/2013, 10:23 AM

## 2013-10-23 NOTE — Progress Notes (Signed)
ANTIBIOTIC CONSULT NOTE - INITIAL  Pharmacy Consult for Vancomycin Indication: Febrile Neutropenia  Allergies  Allergen Reactions  . Morphine And Related Hives    Patient Measurements:   Adjusted Body Weight:   Vital Signs: Temp: 100.3 F (37.9 C) (11/02 0747) Temp src: Oral (11/02 0747) BP: 149/65 mmHg (11/02 0747) Pulse Rate: 86 (11/02 0747) Intake/Output from previous day:   Intake/Output from this shift:    Labs:  Recent Labs  10/21/13 1324 10/23/13 0816  WBC 0.3* 0.1*  HGB 10.1* 9.9*  PLT 37* 13*  CREATININE  --  0.57   The CrCl is unknown because both a height and weight (above a minimum accepted value) are required for this calculation. No results found for this basename: VANCOTROUGH, VANCOPEAK, VANCORANDOM, GENTTROUGH, GENTPEAK, GENTRANDOM, TOBRATROUGH, TOBRAPEAK, TOBRARND, AMIKACINPEAK, AMIKACINTROU, AMIKACIN,  in the last 72 hours   Microbiology: No results found for this or any previous visit (from the past 720 hour(s)).  Medical History: Past Medical History  Diagnosis Date  . PONV (postoperative nausea and vomiting)   . Hypertension   . Hyperlipemia   . Hypothyroidism   . GERD (gastroesophageal reflux disease)   . Arthritis   . Depression   . Deaf, left   . Cancer     skin  . Colon cancer 08/31/13    invasive squamous cell  . Skin cancer    Assessment: 71 yoF with history of squamous cell carcinoma of anus s/p cycle 1 5-FU/mitomycin on 10/20 presents with fever, weakness, and fatigue. Pt started on levaquin 10/29.  Pharmacy consulted to dose vancomycin for febrile neutropenia.  Primaxin x 1 sent to ED.  Pt does not have a PICC line.  No recent hospitalizations noted in [facility].  No allergies to antibiotics.   Antibiotics: 10/29 >> Levaquin >> 11/1 11/2 >>   Primaxin x 1 11/2 >>   Vancomycin >>    Tmax: 100.3 WBCs: 0.1, ANC = 0 Renal: SCr = 0.57, CrCl ~50 (CG), 62 (N) using rounded SCr 0.8  Microbiology: 11/2 Blood cultures 2/2  collected  Goal of Therapy:  Vancomycin trough level 15-20 mcg/ml  Plan:  Vancomycin 1000mg  IVx 1, then 500mg  IV q 12 hours.  F/u continuation of primaxin or cefepime.   F/u renal function, cultures, vancomycin trough at [facility], clinical course.   Haynes Hoehn, PharmD 10/23/2013, 10:25 AM  Pager: [phone]

## 2013-10-23 NOTE — ED Notes (Signed)
Bed: ZO10 Expected date:  Expected time:  Means of arrival:  Comments: CA, fever

## 2013-10-23 NOTE — ED Notes (Signed)
I have just phoned report to [place], [place].  Our tech. Will transport shortly.  She remains in no distress and in good spirits.

## 2013-10-23 NOTE — ED Notes (Signed)
She c/o waxing and waning fever x 3 days.  She states she is a chemo. Pt. And has colorectal cancer.  She is in no distress.

## 2013-10-24 ENCOUNTER — Ambulatory Visit: Admission: RE | Admit: 2013-10-24 | Payer: Medicare Other | Source: Ambulatory Visit

## 2013-10-24 ENCOUNTER — Other Ambulatory Visit: Payer: Medicare Other | Admitting: Lab

## 2013-10-24 DIAGNOSIS — G893 Neoplasm related pain (acute) (chronic): Secondary | ICD-10-CM

## 2013-10-24 DIAGNOSIS — R599 Enlarged lymph nodes, unspecified: Secondary | ICD-10-CM

## 2013-10-24 DIAGNOSIS — R197 Diarrhea, unspecified: Secondary | ICD-10-CM

## 2013-10-24 DIAGNOSIS — R5381 Other malaise: Secondary | ICD-10-CM

## 2013-10-24 DIAGNOSIS — E039 Hypothyroidism, unspecified: Secondary | ICD-10-CM

## 2013-10-24 DIAGNOSIS — K1370 Unspecified lesions of oral mucosa: Secondary | ICD-10-CM

## 2013-10-24 DIAGNOSIS — R110 Nausea: Secondary | ICD-10-CM

## 2013-10-24 HISTORY — DX: Hypomagnesemia: E83.42

## 2013-10-24 LAB — BASIC METABOLIC PANEL
CO2: 22 mEq/L (ref 19–32)
Chloride: 98 mEq/L (ref 96–112)
Creatinine, Ser: 0.65 mg/dL (ref 0.50–1.10)
GFR calc Af Amer: >90 mL/min (ref >90)
GFR calc non Af Amer: 81 mL/min — ABNORMAL LOW (ref >90)
Glucose, Bld: 91 mg/dL (ref 70–99)
Potassium: 3.3 mEq/L — ABNORMAL LOW (ref 3.5–5.1)
Sodium: 128 mEq/L — ABNORMAL LOW (ref 135–145)

## 2013-10-24 LAB — CBC WITH DIFFERENTIAL/PLATELET
Basophils Relative: 0 % (ref 0–1)
Eosinophils Absolute: 0 10*3/uL (ref 0.0–0.7)
Eosinophils Relative: 1 % (ref 0–5)
HCT: 25.2 % — ABNORMAL LOW (ref 36.0–46.0)
Hemoglobin: 9 g/dL — ABNORMAL LOW (ref 12.0–15.0)
Lymphocytes Relative: 23 % (ref 12–46)
Lymphs Abs: 0.2 10*3/uL — ABNORMAL LOW (ref 0.7–4.0)
MCH: 30.9 pg (ref 26.0–34.0)
MCV: 86.6 fL (ref 78.0–100.0)
Neutrophils Relative %: 55 % (ref 43–77)
RBC: 2.91 MIL/uL — ABNORMAL LOW (ref 3.87–5.11)
RDW: 11.5 % (ref 11.5–15.5)

## 2013-10-24 LAB — MAGNESIUM: Magnesium: 1.4 mg/dL — ABNORMAL LOW (ref 1.5–2.5)

## 2013-10-24 MED ORDER — MAGNESIUM OXIDE 400 (241.3 MG) MG PO TABS
400.0000 mg | ORAL_TABLET | Freq: Two times a day (BID) | ORAL | Status: DC
  Administered 2013-10-24 – 2013-10-27 (×7): 400 mg via ORAL
  Filled 2013-10-24 (×8): qty 1

## 2013-10-24 MED ORDER — POTASSIUM CHLORIDE CRYS ER 20 MEQ PO TBCR
40.0000 meq | EXTENDED_RELEASE_TABLET | Freq: Once | ORAL | Status: DC
  Filled 2013-10-24: qty 2

## 2013-10-24 MED ORDER — POTASSIUM CHLORIDE 20 MEQ/15ML (10%) PO LIQD
40.0000 meq | Freq: Once | ORAL | Status: AC
  Administered 2013-10-24: 40 meq via ORAL
  Filled 2013-10-24: qty 30

## 2013-10-24 MED ORDER — ADULT MULTIVITAMIN W/MINERALS CH
1.0000 | ORAL_TABLET | Freq: Every day | ORAL | Status: DC
  Administered 2013-10-24 – 2013-10-27 (×4): 1 via ORAL
  Filled 2013-10-24 (×4): qty 1

## 2013-10-24 MED ORDER — ENSURE COMPLETE PO LIQD: 237.0000 mL | Freq: Two times a day (BID) | ORAL | Status: DC

## 2013-10-24 NOTE — Progress Notes (Signed)
IP PROGRESS NOTE  Subjective:   She was admitted yesterday with fever in the setting of severe neutropenia. She reports diarrhea. The mouth soreness has improved.  Objective: Vital signs in last 24 hours: Blood pressure 122/60, pulse 80, temperature 98.3 F (36.8 C), temperature source Oral, resp. rate 16, height 5' 4.96" (1.65 m), weight 146 lb 9.7 oz (66.5 kg), SpO2 98.00%.  Intake/Output from previous day: 11/02 0701 - 11/03 0700 In: 2565.3 [P.O.:120; I.V.:1136.3; IV Piggyback:1100] Out: 1100 [Urine:1050; Stool:50]  Physical Exam:  HEENT: Resolving ulcerations over the palate, tongue, and lower lip. No thrush. Lungs: Clear bilaterally Cardiac: Regular rate and rhythm Abdomen: No hepatosplenomegaly Extremities: No leg edema Skin: No breakdown at the perineum. Lymph nodes: Firm 1-2 cm right inguinal node  Portacath/PICC-without erythema  Lab Results:  Recent Labs  10/23/13 0816 10/24/13 0410  WBC 0.1* 0.9*  HGB 9.9* 9.0*  HCT 27.6* 25.2*  PLT 13* 10*   ANC 0.5 on 10/24/2013  BMET  Recent Labs  10/23/13 0816 10/24/13 0410  NA 126* 128*  K 2.7* 3.3*  CL 91* 98  CO2 24 22  GLUCOSE 140* 91  BUN 5* 6  CREATININE 0.57 0.65  CALCIUM 8.7 8.4    Studies/Results: Dg Chest 2 View  10/23/2013   CLINICAL DATA:  Fever, weakness, hypertension.  EXAM: CHEST  2 VIEW  COMPARISON:  10/17/2012  FINDINGS: The heart size is normal. There is mild perihilar bronchial thickening. No focal consolidations or pleural effusions. No pulmonary edema.Surgical clips are identified in the upper abdomen. There is mild thoracic degenerative change.  IMPRESSION: 1. Bronchitic changes. 2.  No evidence for acute cardiopulmonary abnormality.   Electronically Signed   By: Rosalie Gums M.D.   On: 10/23/2013 09:33    Medications: I have reviewed the patient's current medications.  Assessment/Plan:  1. Squamous cell carcinoma of the anus. Staging PET scan 09/23/2013 of a hypermetabolic anal mass  and hypermetabolic right inguinal/perineal nodes. Radiation and cycle 1 5-FU/mitomycin C. beginning 10/10/2013. 2. Question palpable inguinal/pubic lymphadenopathy. The suprapubic palpable lesions appeared hypermetabolic on the PET scan. Smaller on exam today. 3. Rectal pain and bleeding secondary to #1. 4. Nausea predating chemotherapy.  5. Thickening/hypermetabolism at the splenic flexure. Question second primary. 6. Mucositis secondary to chemotherapy. Improved. 7. Skin rash likely related to 5-fluorouracil. Improved. 8. Pancytopenia secondary to chemotherapy, the neutrophil count has improved with Neupogen. Persistent severe thrombocytopenia. 9. Diarrhea-likely secondary to chemotherapy induced enteritis , C. Difficile negative 10/23/2013 10. Hypokalemia secondary to diarrhea 11. Fever in the setting of neutropenia-maintained on broad-spectrum antibiotics, cultures negative to date   She was admitted with fever and pancytopenia. She is now afebrile and the neutrophil count is rising. The oral mucositis appears improved. We will dose reduce the 5-fluorouracil and mitomycin C. with the next cycle of chemotherapy.  1. Continue G-CSF 2. Continue broad-spectrum intravenous antibiotics and followup blood cultures 3. Daily neutrophil and platelet count, transfuse platelets for less than 10,000 or bleeding 4. Outpatient followup as [facility] at the [facility]. 5. Resume radiation when appropriate per Dr. Mitzi Hansen  I appreciate the care from the hospitalist service.   LOS: 1 day   Avi Kerschner  10/24/2013, 2:10 PM

## 2013-10-24 NOTE — Progress Notes (Signed)
INITIAL NUTRITION ASSESSMENT  Pt meets criteria for severe MALNUTRITION in the context of chronic illness as evidenced by <50-75% estimated energy intake with 6.4% weight loss in the past month.  DOCUMENTATION CODES Per approved criteria  -Severe malnutrition in the context of chronic illness   INTERVENTION: - Encouraged low lactose intake and ordered Ensure Complete BID - Recommend Florastor to help with diarrhea - Recommend MD check pt's phosphorus as pt at risk of refeeding syndrome r/t minimal intake x 1 month with low potassium and magnesium - Multivitamin 1 tablet PO daily  - Will continue to monitor   NUTRITION DIAGNOSIS: Inadequate oral intake related to mouth sores, diarrhea, poor appetite as evidenced by <10% meal intake.   Goal: Pt to consume >90% of meals/supplements  Monitor:  Weights, labs, intake, diet advancement, nausea, diarrhea, mouth sores  Reason for Assessment: Nutrition risk   79 y.o. female  Admitting Dx: Fever and cough  ASSESSMENT: Pt with rectal squamous cell CA, admitted with fever and cough. Pt reports having mouth sores and diarrhea for the past month since getting chemotherapy and radiation. Reports minimal intake PTA r/t having diarrhea within 20 minutes after taking a few bites of food. Tried to drink Boost at home but didn't like. Reports 10 pound unintended weight loss in the past month. C/o pain with eating and is unable to wear the top set of her dentures so she has had to eat only soft foods. Sore throat and nausea on admission are both better per pt. Reports 3 episodes of diarrhea for her today which is typical for her at home. Ate only a few bites of grits and broth today.   Sodium, potassium, and magnesium low - getting oral magnesium replacement and oral and IV potassium replacement.   Height: Ht Readings from Last 1 Encounters:  10/23/13 5' 4.96" (1.65 m)    Weight: Wt Readings from Last 1 Encounters:  10/23/13 146 lb 9.7 oz (66.5  kg)    Ideal Body Weight: 125 lb   % Ideal Body Weight: 117%  Wt Readings from Last 10 Encounters:  10/23/13 146 lb 9.7 oz (66.5 kg)  10/19/13 146 lb 11.2 oz (66.543 kg)  10/17/13 146 lb 6.4 oz (66.407 kg)  10/13/13 154 lb 3.2 oz (69.945 kg)  10/10/13 148 lb 14.4 oz (67.541 kg)  09/19/13 149 lb 8 oz (67.813 kg)  09/09/13 148 lb 9.6 oz (67.405 kg)  03/18/13 146 lb 2.6 oz (66.3 kg)  10/17/12 153 lb 3.5 oz (69.5 kg)  10/17/12 153 lb 3.5 oz (69.5 kg)    Usual Body Weight: 156 lb per pt  % Usual Body Weight: 93%  BMI:  Body mass index is 24.43 kg/(m^2).  Estimated Nutritional Needs: Kcal: 1650-1750 Protein: 80-90g Fluid: 1.6-1.7L/day  Skin: Intact  Diet Order: Full Liquid  EDUCATION NEEDS: -No education needs identified at this time   Intake/Output Summary (Last 24 hours) at 10/24/13 1606 Last data filed at 10/24/13 1300  Gross per 24 hour  Intake 2010.25 ml  Output    650 ml  Net 1360.25 ml    Last BM: 11/3, diarrhea  Labs:   Recent Labs Lab 10/19/13 1439 10/23/13 0816 10/24/13 0410  NA 128* 126* 128*  K 3.5 2.7* 3.3*  CL  --  91* 98  CO2 22 24 22   BUN 12.3 5* 6  CREATININE 0.8 0.57 0.65  CALCIUM 9.0 8.7 8.4  MG  --   --  1.4*  GLUCOSE 115 140* 91  CBG (last 3)  No results found for this basename: GLUCAP,  in the last 72 hours  Scheduled Meds: . ALPRAZolam  0.25 mg Oral TID  . filgrastim (NEUPOGEN)  SQ  300 mcg Subcutaneous Daily  . FLUoxetine  40 mg Oral q morning - 10a  . imipenem-cilastatin  250 mg Intravenous Q6H  . levothyroxine  100 mcg Oral QAC breakfast  . magnesium oxide  400 mg Oral BID  . potassium chloride  40 mEq Oral Once  . vancomycin  500 mg Intravenous Q12H    Continuous Infusions: . sodium chloride 0.9 % 1,000 mL with potassium chloride 20 mEq infusion 75 mL/hr at 10/24/13 1221    Past Medical History  Diagnosis Date  . PONV (postoperative nausea and vomiting)   . Hypertension   . Hyperlipemia   .  Hypothyroidism   . GERD (gastroesophageal reflux disease)   . Arthritis   . Depression   . Deaf, left   . Colon cancer 08/31/13    invasive squamous cell  . Cancer     skin  . Skin cancer     Shingles 2012 Bad case    Past Surgical History  Procedure Laterality Date  . Abdominal hysterectomy    . Cholecystectomy    . Tonsillectomy    . Appendectomy    . Thyroidectomy, partial    . Ercp    . Colonoscopy  08/31/13    invasive squamous cell colon  . Eye surgery      cataracts  . Knee arthroscopy  05/11/2012    Procedure: ARTHROSCOPY KNEE;  Surgeon: Velna Ochs, MD;  Location: [facility];  Service: Orthopedics;  Laterality: Right;  left knee medial menisectomy and chondroplasty  . Esophagogastroduodenoscopy  10/20/2012    Procedure: ESOPHAGOGASTRODUODENOSCOPY (EGD);  Surgeon: Willis Modena, MD;  Location: [facility];  Service: Endoscopy;  Laterality: Left;  . Cesarean section      2    Levon Hedger MS, RD, LDN [phone] Pager [phone] After Hours Pager

## 2013-10-24 NOTE — Progress Notes (Signed)
[facility] PROGRESS NOTE  Cindy [name] WJX:[id number] DOB: Nov 12, 1930 DOA: 10/23/2013 PCP: Gaye Alken, MD  Assessment/Plan: Febrile neutropenia  -Blood cultures x2 sets have been obtained -NGTD -UA negative  -Empiric vancomycin and imipenem  -Neupogen 54mcg/kg daily  -ANC= 14  -C. difficile PCR  -suspect source may be pulmonary given "bronchitic changes" on CXR   Squamous cell carcinoma of the rectum  -Last chemotherapy 10/15/2013  -Last XRT on 10/20/13  -MedOnc has been notified of pt's admission   Diarrhea  - C. Diff PCR - Negative -currently on levofloxacin for empiric coverage since 10/29   Hyponatremia  -Likely volume depletion-slow improvement -IV normal saline   Hypokalemia /Hypomagnesemia -Secondary to loose stools  -Replace both & recheck levels tomorrow am  Mucositis  -Magic mouthwash a AC   Code Status: Full Family Communication: Daughter at the bedside  Disposition Plan: Here for at least a few more days until WBC more stable & remains afebrile   Consultants:  [facility]-On[facility]  Procedures:  None  Antibiotics:  Levaquin 10/30-11/2  Primaxin/vancomycin: 11/2-present  HPI/Subjective: Patient feeling okay. Occasional nonproductive cough. Does not feel feverish. Fatigue. Does complain of some mouth ulcers which are ongoing Objective: Filed Vitals:   10/24/13 0426  BP: 139/57  Pulse: 85  Temp: 98.3 F (36.8 C)  Resp: 16    Intake/Output Summary (Last 24 hours) at 10/24/13 0825 Last data filed at 10/24/13 0430  Gross per 24 hour  Intake 2565.25 ml  Output   1100 ml  Net 1465.25 ml   Filed Weights   10/23/13 0900  Weight: 66.5 kg (146 lb 9.7 oz)    Exam:   General:  Alert and oriented x3, no acute distress, fatigued  Cardiovascular: Regular rate and rhythm, S1-S2  Respiratory: Clear to auscultation bilaterally  Abdomen: Soft, nontender, nondistended, positive bowel sounds  Musculoskeletal: No  clubbing or cyanosis, trace pitting edema   Data Reviewed: Basic Metabolic Panel:  Recent Labs Lab 10/18/13 1352 10/19/13 1439 10/23/13 0816 10/24/13 0410  NA 123* 128* 126* 128*  K 3.5 3.5 2.7* 3.3*  CL  --   --  91* 98  CO2 21* 22 24 22   GLUCOSE 110 115 140* 91  BUN 11.8 12.3 5* 6  CREATININE 0.7 0.8 0.57 0.65  CALCIUM 9.0 9.0 8.7 8.4  MG  --   --   --  1.4*   Liver Function Tests:  Recent Labs Lab 10/18/13 1352 10/23/13 0816  AST 15 20  ALT 11 21  ALKPHOS 56 53  BILITOT 0.56 0.4  PROT 6.7 6.5  ALBUMIN 2.9* 2.7*   No results found for this basename: LIPASE, AMYLASE,  in the last 168 hours No results found for this basename: AMMONIA,  in the last 168 hours CBC:  Recent Labs Lab 10/18/13 1351 10/19/13 1325 10/21/13 1324 10/23/13 0816 10/24/13 0410  WBC 1.1* 0.5* 0.3* 0.1* 0.9*  NEUTROABS 0.8* 0.3* 0.1* 0.0* 0.5*  HGB 10.6* 10.5* 10.1* 9.9* 9.0*  HCT 30.6* 29.9* 28.7* 27.6* 25.2*  MCV 89.7 87.7 87.0 86.3 86.6  PLT 132* 104* 37* 13* 10*   Cardiac Enzymes: No results found for this basename: CKTOTAL, CKMB, CKMBINDEX, TROPONINI,  in the last 168 hours BNP (last 3 results) No results found for this basename: PROBNP,  in the last 8760 hours CBG: No results found for this basename: GLUCAP,  in the last 168 hours  No results found for this or any previous visit (from the past 240 hour(s)).   Studies:  Dg Chest 2 View  10/23/2013   CLINICAL DATA:  Fever, weakness, hypertension.  EXAM: CHEST  2 VIEW  COMPARISON:  10/17/2012  FINDINGS: The heart size is normal. There is mild perihilar bronchial thickening. No focal consolidations or pleural effusions. No pulmonary edema.Surgical clips are identified in the upper abdomen. There is mild thoracic degenerative change.  IMPRESSION: 1. Bronchitic changes. 2.  No evidence for acute cardiopulmonary abnormality.   Electronically Signed   By: Rosalie Gums M.D.   On: 10/23/2013 09:33    Scheduled Meds: . ALPRAZolam  0.25 mg  Oral TID  . filgrastim (NEUPOGEN)  SQ  300 mcg Subcutaneous Daily  . FLUoxetine  40 mg Oral q morning - 10a  . imipenem-cilastatin  250 mg Intravenous Q6H  . levothyroxine  100 mcg Oral QAC breakfast  . vancomycin  500 mg Intravenous Q12H   Continuous Infusions: . sodium chloride 0.9 % 1,000 mL with potassium chloride 20 mEq infusion 75 mL/hr at 10/23/13 1321    Active Problems:   Hyponatremia   Hypokalemia   Hypothyroidism   Anal cancer   Febrile neutropenia    Time spent: 35 min    Cindy [name]  [facility] Pager [phone]. If 7PM-7AM, please contact night-coverage at [email or web address], password [facility] 10/24/2013, 8:25 AM  LOS: 1 day

## 2013-10-25 ENCOUNTER — Ambulatory Visit
Admission: RE | Admit: 2013-10-25 | Discharge: 2013-10-25 | Disposition: A | Discharge status: Home / Self Care | Payer: Medicare Other | Source: Ambulatory Visit | Attending: Radiation Oncology | Admitting: Radiation Oncology

## 2013-10-25 ENCOUNTER — Telehealth: Payer: Self-pay | Admitting: *Deleted

## 2013-10-25 DIAGNOSIS — D696 Thrombocytopenia, unspecified: Secondary | ICD-10-CM | POA: Diagnosis present

## 2013-10-25 DIAGNOSIS — C210 Malignant neoplasm of anus, unspecified: Secondary | ICD-10-CM | POA: Diagnosis not present

## 2013-10-25 DIAGNOSIS — E43 Unspecified severe protein-calorie malnutrition: Secondary | ICD-10-CM | POA: Diagnosis present

## 2013-10-25 LAB — CBC WITH DIFFERENTIAL/PLATELET
Basophils Absolute: 0 10*3/uL (ref 0.0–0.1)
Basophils Relative: 0 % (ref 0–1)
Eosinophils Absolute: 0 10*3/uL (ref 0.0–0.7)
Eosinophils Relative: 1 % (ref 0–5)
HCT: 25 % — ABNORMAL LOW (ref 36.0–46.0)
Hemoglobin: 8.9 g/dL — ABNORMAL LOW (ref 12.0–15.0)
Lymphocytes Relative: 7 % — ABNORMAL LOW (ref 12–46)
Lymphs Abs: 0.3 10*3/uL — ABNORMAL LOW (ref 0.7–4.0)
MCH: 30.9 pg (ref 26.0–34.0)
MCHC: 35.6 g/dL (ref 30.0–36.0)
MCV: 86.8 fL (ref 78.0–100.0)
Monocytes Absolute: 0.3 10*3/uL (ref 0.1–1.0)
Monocytes Relative: 6 % (ref 3–12)
Neutro Abs: 3.8 10*3/uL (ref 1.7–7.7)
Neutrophils Relative %: 86 % — ABNORMAL HIGH (ref 43–77)
Platelets: 13 10*3/uL — CL (ref 150–400)
RBC: 2.88 MIL/uL — ABNORMAL LOW (ref 3.87–5.11)
RDW: 11.5 % (ref 11.5–15.5)
WBC: 4.4 10*3/uL (ref 4.0–10.5)

## 2013-10-25 LAB — BASIC METABOLIC PANEL
BUN: 5 mg/dL — ABNORMAL LOW (ref 6–23)
Calcium: 8.5 mg/dL (ref 8.4–10.5)
Chloride: 102 mEq/L (ref 96–112)
GFR calc non Af Amer: 84 mL/min — ABNORMAL LOW (ref >90)
Glucose, Bld: 92 mg/dL (ref 70–99)

## 2013-10-25 LAB — MAGNESIUM: Magnesium: 1.6 mg/dL (ref 1.5–2.5)

## 2013-10-25 LAB — URINE CULTURE
Colony Count: NO GROWTH
Culture: NO GROWTH

## 2013-10-25 MED ORDER — LOPERAMIDE HCL 2 MG PO CAPS
2.0000 mg | ORAL_CAPSULE | ORAL | Status: DC | PRN
  Administered 2013-10-25: 4 mg via ORAL
  Filled 2013-10-25: qty 2

## 2013-10-25 NOTE — Progress Notes (Signed)
[facility] Radiation Oncology Dept Therapy Treatment Record Phone [phone]   Radiation Therapy was administered to Cindy [name] on: 10/25/2013  2:26 PM and was treatment # 10 out of a planned course of 30 treatments.

## 2013-10-25 NOTE — Progress Notes (Signed)
[facility]    Radiation Oncology [address] [place]     Maryln Gottron, M.D. [place], [place] [address]               Billie Lade, M.D., Ph.D. Phone: [phone]      Molli Hazard A. Kathrynn Running, M.D. Fax: [phone]      Radene Gunning, M.D., Ph.D.         Lurline Hare, M.D.         Grayland Jack, M.D Weekly Treatment Management Note  Name: Cindy [name]     MRN: [id number]        CSN: [id number] Date: 10/25/2013      DOB: 1930-06-24  CC: Cindy Alken, MD         Zachery Dauer    Status: INPATIENT  Diagnosis: The encounter diagnosis was Anal cancer.  Current Dose: 18 Gy  Current Fraction: 10  Planned Dose: 54 Gy  Narrative: Cindy [name] was seen today for weekly treatment management. The chart was checked and MVCT  were reviewed.  The patient is currently admitted related to problems with febrile neutropenia and severe thrombocytopenia. She continues to have problems with diarrhea. She is C. difficile negative on PCR.  Med/onc agreeable with  starting radiation treatment today.   Morphine and related No current facility-administered medications for this encounter.   No current outpatient prescriptions on file.   Facility-Administered Medications Ordered in Other Encounters  Medication Dose Route Frequency Provider Last Rate Last Dose  . acetaminophen (TYLENOL) tablet 650 mg  650 mg Oral Q6H PRN Catarina Hartshorn, MD       Or  . acetaminophen (TYLENOL) suppository 650 mg  650 mg Rectal Q6H PRN Catarina Hartshorn, MD      . ALPRAZolam Prudy Feeler) tablet 0.25 mg  0.25 mg Oral TID Catarina Hartshorn, MD   0.25 mg at 10/25/13 1114  . FLUoxetine (PROZAC) capsule 40 mg  40 mg Oral q morning - 10a Catarina Hartshorn, MD   40 mg at 10/25/13 1114  . HYDROcodone-acetaminophen (NORCO/VICODIN) 5-325 MG per tablet 1 tablet  1 tablet Oral Q4H PRN Catarina Hartshorn, MD   1 tablet at 10/23/13 1848  . imipenem-cilastatin (PRIMAXIN) 250 mg in sodium chloride 0.9 % 100 mL IVPB  250 mg Intravenous Q6H Catarina Hartshorn,  MD   250 mg at 10/25/13 1121  . levothyroxine (SYNTHROID, LEVOTHROID) tablet 100 mcg  100 mcg Oral QAC breakfast Catarina Hartshorn, MD   100 mcg at 10/25/13 1114  . loperamide (IMODIUM) capsule 2-4 mg  2-4 mg Oral PRN Ladene Artist, MD   4 mg at 10/25/13 1230  . magic mouthwash w/lidocaine  10 mL Oral QID PRN Catarina Hartshorn, MD   10 mL at 10/24/13 1939  . magnesium oxide (MAG-OX) tablet 400 mg  400 mg Oral BID Hollice Espy, MD   400 mg at 10/25/13 1114  . multivitamin with minerals tablet 1 tablet  1 tablet Oral Daily Lavena Bullion, RD   1 tablet at 10/25/13 1114  . ondansetron (ZOFRAN) tablet 4 mg  4 mg Oral Q6H PRN Catarina Hartshorn, MD       Or  . ondansetron [facility]) injection 4 mg  4 mg Intravenous Q6H PRN Catarina Hartshorn, MD      . sodium chloride 0.9 % 1,000 mL with potassium chloride 20 mEq infusion   Intravenous Continuous Catarina Hartshorn, MD 75 mL/hr at 10/25/13 0402    . vancomycin (VANCOCIN) 500 mg in  sodium chloride 0.9 % 100 mL IVPB  500 mg Intravenous Q12H Colleen E Summe, RPH   500 mg at 10/25/13 1402  . zolpidem (AMBIEN) tablet 5 mg  5 mg Oral QHS PRN Catarina Hartshorn, MD   5 mg at 10/24/13 2057   Labs:  Lab Results  Component Value Date   WBC 4.4 10/25/2013   HGB 8.9* 10/25/2013   HCT 25.0* 10/25/2013   MCV 86.8 10/25/2013   PLT 13* 10/25/2013   Lab Results  Component Value Date   CREATININE 0.59 10/25/2013   BUN 5* 10/25/2013   NA 132* 10/25/2013   K 3.6 10/25/2013   CL 102 10/25/2013   CO2 22 10/25/2013   Lab Results  Component Value Date   ALT 21 10/23/2013   AST 20 10/23/2013   BILITOT 0.4 10/23/2013    Physical Examination:  vitals were not taken for this visit.   Wt Readings from Last 3 Encounters:  10/23/13 146 lb 9.7 oz (66.5 kg)  10/19/13 146 lb 11.2 oz (66.543 kg)  10/17/13 146 lb 6.4 oz (66.407 kg)    She is lying comfortably in her hospital bed. Lungs - Normal respiratory effort, chest expands symmetrically. Lungs are clear to auscultation, no crackles or wheezes.  Heart has regular  rhythm and rate  Abdomen is soft and non tender with normal bowel sounds  Assessment:  Patient tolerating treatments well except for above issues  Plan: Continue treatment per original radiation prescription

## 2013-10-25 NOTE — Progress Notes (Signed)
[facility] PROGRESS NOTE  Cindy [name] AVW:[id number] DOB: 1930-11-02 DOA: 10/23/2013 PCP: Gaye Alken, MD  Interim summary:  Patient with history squamous cell carcinoma of the rectum on chemotherapy and x-ray therapy presented with febrile neutropenia. Oncology has been following and as of 11/4, white blood cell count normalized, but patient still severely thrombocytopenic  Assessment/Plan: Febrile neutropenia  -Blood cultures x2 sets have been obtained -NGTD -UA negative  -Empiric vancomycin and imipenem  -Neupogen 9mcg/kg daily  -ANC= 14  -C. difficile PCR  -suspect source may be pulmonary given "bronchitic changes" on CXR  White blood cell count now improved.  Thrombocytopenia: Monitoring closely. Minimal elevated compared to yesterday. Transfuse if platelet count drops to 10 or less or active bleeding occurs  Squamous cell carcinoma of the rectum  -Last chemotherapy 10/15/2013  -Last XRT on 10/20/13  -MedOnc has been notified of pt's admission   Diarrhea  - C. Diff PCR - Negative Earlier on levofloxacin for empiric coverage since 10/29   Hyponatremia  -Likely volume depletion-resolved today -IV normal saline   Hypokalemia /Hypomagnesemia -Secondary to loose stools  Replaced both 11/3. Levels on 11/4 are normal  Mucositis  -Magic mouthwash a AC   Code Status: Full Family Communication: Daughter at the bedside  Disposition Plan: Here until platelet count improves   Consultants:  [facility]-Oncology  Procedures:  None  Antibiotics:  Levaquin 10/30-11/2  Primaxin/vancomycin: 11/2-present  HPI/Subjective: Patient feeling better. No shortness of breath or cough. No bleeding episodes.  Objective: Filed Vitals:   10/25/13 0431  BP: 146/58  Pulse: 84  Temp: 98.2 F (36.8 C)  Resp: 16    Intake/Output Summary (Last 24 hours) at 10/25/13 0824 Last data filed at 10/25/13 8295  Gross per 24 hour  Intake 2082.5 ml  Output     750 ml  Net 1332.5 ml   Filed Weights   10/23/13 0900  Weight: 66.5 kg (146 lb 9.7 oz)    Exam:   General:  Alert and oriented x3, no acute distress, fatigued  Cardiovascular: Regular rate and rhythm, S1-S2  Respiratory: Clear to auscultation bilaterally  Abdomen: Soft, nontender, nondistended, positive bowel sounds  Musculoskeletal: No clubbing or cyanosis, trace pitting edema   Data Reviewed: Basic Metabolic Panel:  Recent Labs Lab 10/18/13 1352 10/19/13 1439 10/23/13 0816 10/24/13 0410 10/25/13 0333  NA 123* 128* 126* 128* 132*  K 3.5 3.5 2.7* 3.3* 3.6  CL  --   --  91* 98 102  CO2 21* 22 24 22 22   GLUCOSE 110 115 140* 91 92  BUN 11.8 12.3 5* 6 5*  CREATININE 0.7 0.8 0.57 0.65 0.59  CALCIUM 9.0 9.0 8.7 8.4 8.5  MG  --   --   --  1.4* 1.6   Liver Function Tests:  Recent Labs Lab 10/18/13 1352 10/23/13 0816  AST 15 20  ALT 11 21  ALKPHOS 56 53  BILITOT 0.56 0.4  PROT 6.7 6.5  ALBUMIN 2.9* 2.7*   No results found for this basename: LIPASE, AMYLASE,  in the last 168 hours No results found for this basename: AMMONIA,  in the last 168 hours CBC:  Recent Labs Lab 10/19/13 1325 10/21/13 1324 10/23/13 0816 10/24/13 0410 10/25/13 0333  WBC 0.5* 0.3* 0.1* 0.9* 4.4  NEUTROABS 0.3* 0.1* 0.0* 0.5* 3.8  HGB 10.5* 10.1* 9.9* 9.0* 8.9*  HCT 29.9* 28.7* 27.6* 25.2* 25.0*  MCV 87.7 87.0 86.3 86.6 86.8  PLT 104* 37* 13* 10* 13*   Cardiac Enzymes: No  results found for this basename: CKTOTAL, CKMB, CKMBINDEX, TROPONINI,  in the last 168 hours BNP (last 3 results) No results found for this basename: PROBNP,  in the last 8760 hours CBG: No results found for this basename: GLUCAP,  in the last 168 hours  Recent Results (from the past 240 hour(s))  CULTURE, BLOOD (ROUTINE X 2)     Status: None   Collection Time    10/23/13  8:10 AM      Result Value Range Status   Specimen Description BLOOD RIGHT ARM   Final   Special Requests BOTTLES DRAWN AEROBIC AND  ANAEROBIC 5CC EACH   Final   Culture  Setup Time     Final   Value: 10/23/2013 20:57     Performed at [facility]   Culture     Final   Value:        BLOOD CULTURE RECEIVED NO GROWTH TO DATE CULTURE WILL BE HELD FOR 5 DAYS BEFORE ISSUING A FINAL NEGATIVE REPORT     Performed at [facility]   Report Status PENDING   Incomplete  CULTURE, BLOOD (ROUTINE X 2)     Status: None   Collection Time    10/23/13  8:40 AM      Result Value Range Status   Specimen Description BLOOD RIGHT ANTECUBITAL   Final   Special Requests BOTTLES DRAWN AEROBIC AND ANAEROBIC 3 CC EA   Final   Culture  Setup Time     Final   Value: 10/23/2013 20:57     Performed at [facility]   Culture     Final   Value:        BLOOD CULTURE RECEIVED NO GROWTH TO DATE CULTURE WILL BE HELD FOR 5 DAYS BEFORE ISSUING A FINAL NEGATIVE REPORT     Performed at [facility]   Report Status PENDING   Incomplete  CLOSTRIDIUM DIFFICILE BY PCR     Status: None   Collection Time    10/23/13  7:06 PM      Result Value Range Status   C difficile by pcr NEGATIVE  NEGATIVE Final   Comment: Performed at [facility]     Studies: Dg Chest 2 View  10/23/2013   CLINICAL DATA:  Fever, weakness, hypertension.  EXAM: CHEST  2 VIEW  COMPARISON:  10/17/2012  FINDINGS: The heart size is normal. There is mild perihilar bronchial thickening. No focal consolidations or pleural effusions. No pulmonary edema.Surgical clips are identified in the upper abdomen. There is mild thoracic degenerative change.  IMPRESSION: 1. Bronchitic changes. 2.  No evidence for acute cardiopulmonary abnormality.   Electronically Signed   By: Rosalie Gums M.D.   On: 10/23/2013 09:33    Scheduled Meds: . ALPRAZolam  0.25 mg Oral TID  . feeding supplement (ENSURE COMPLETE)  237 mL Oral BID BM  . filgrastim (NEUPOGEN)  SQ  300 mcg Subcutaneous Daily  . FLUoxetine  40 mg Oral q morning - 10a  . imipenem-cilastatin  250 mg Intravenous  Q6H  . levothyroxine  100 mcg Oral QAC breakfast  . magnesium oxide  400 mg Oral BID  . multivitamin with minerals  1 tablet Oral Daily  . vancomycin  500 mg Intravenous Q12H   Continuous Infusions: . sodium chloride 0.9 % 1,000 mL with potassium chloride 20 mEq infusion 75 mL/hr at 10/25/13 0402    Active Problems:   Hyponatremia   Hypokalemia   Hypothyroidism   Anal  cancer   Febrile neutropenia   Hypomagnesemia   Protein-calorie malnutrition, severe    Time spent: 20 min    Hollice Espy  [facility] Pager [phone]. If 7PM-7AM, please contact night-coverage at [email or web address], password [facility] 10/25/2013, 8:24 AM  LOS: 2 days

## 2013-10-25 NOTE — Progress Notes (Signed)
IP PROGRESS NOTE  Subjective:   The mouth feels better. She continues to have diarrhea.  Objective: Vital signs in last 24 hours: Blood pressure 146/58, pulse 84, temperature 98.2 F (36.8 C), temperature source Oral, resp. rate 16, height 5' 4.96" (1.65 m), weight 146 lb 9.7 oz (66.5 kg), SpO2 96.00%.  Intake/Output from previous day: 11/03 0701 - 11/04 0700 In: 2202.5 [P.O.:290; I.V.:1912.5] Out: 600 [Urine:600]  Physical Exam:  HEENT: Resolving ulcerations over the palate, tongue, and lower lip. No thrush. Lungs: Clear bilaterally Cardiac: Regular rate and rhythm Abdomen: No hepatosplenomegaly Extremities: No leg edema Skin: Resolving rash of the neck and dorsum of the hands   Lab Results:  Recent Labs  10/24/13 0410 10/25/13 0333  WBC 0.9* 4.4  HGB 9.0* 8.9*  HCT 25.2* 25.0*  PLT 10* 13*   ANC 3.8  BMET  Recent Labs  10/24/13 0410 10/25/13 0333  NA 128* 132*  K 3.3* 3.6  CL 98 102  CO2 22 22  GLUCOSE 91 92  BUN 6 5*  CREATININE 0.65 0.59  CALCIUM 8.4 8.5    Studies/Results: No results found.  Medications: I have reviewed the patient's current medications.  Assessment/Plan:  1. Squamous cell carcinoma of the anus. Staging PET scan 09/23/2013 of a hypermetabolic anal mass and hypermetabolic right inguinal/perineal nodes. Radiation and cycle 1 5-FU/mitomycin C. beginning 10/10/2013. 2. Question palpable inguinal/pubic lymphadenopathy. The suprapubic palpable lesions appeared hypermetabolic on the PET scan. 3. Rectal pain and bleeding secondary to #1. 4. Nausea predating chemotherapy.  5. Thickening/hypermetabolism at the splenic flexure. Question second primary. 6. Mucositis secondary to chemotherapy. Improved. 7. Skin rash likely related to 5-fluorouracil. Improved. 8. Pancytopenia secondary to chemotherapy, the neutrophil count has improved with Neupogen. Persistent severe thrombocytopenia. 9. Diarrhea-likely secondary to chemotherapy induced  enteritis , C. Difficile negative 10/23/2013 10. Hypokalemia secondary to diarrhea 11. Fever in the setting of neutropenia-maintained on broad-spectrum antibiotics, cultures negative to date   She appears improved today. She continues to have diarrhea. This is likely related to chemotherapy.  1. discontinue G-CSF 2. Continue broad-spectrum intravenous antibiotics and followup blood cultures 3. check platelet count 10/26/2013 4. I discussed the case with Dr. Mitzi Hansen. Radiation will be resumed today 5. Imodium for diarrhea  I appreciate the care from the hospitalist service. She can be discharged over the next few days on oral antibiotics if the diarrhea improves and the platelet count begins to rise. I discussed the current status with her family.   LOS: 2 days   Dearius Hoffmann  10/25/2013, 1:43 PM

## 2013-10-25 NOTE — Telephone Encounter (Signed)
Showed patients cbc count results ,plt=13, okay to treat per Dr.moody, called the floor 1306 and spoke with Carolyn,RN informed pat to have rad tx today at 230pm,please medicate for pain if needed, patient is eating breakfast this am doing good per RN, called Faith,therapist linac #4, okay to treat patient per MD 8:41 AM

## 2013-10-26 ENCOUNTER — Ambulatory Visit
Admission: RE | Admit: 2013-10-26 | Discharge: 2013-10-26 | Disposition: A | Discharge status: Home / Self Care | Payer: Medicare Other | Source: Ambulatory Visit | Attending: Radiation Oncology | Admitting: Radiation Oncology

## 2013-10-26 DIAGNOSIS — C210 Malignant neoplasm of anus, unspecified: Secondary | ICD-10-CM | POA: Diagnosis not present

## 2013-10-26 LAB — BASIC METABOLIC PANEL
BUN: 5 mg/dL — ABNORMAL LOW (ref 6–23)
CO2: 24 mEq/L (ref 19–32)
Calcium: 8.6 mg/dL (ref 8.4–10.5)
Creatinine, Ser: 0.65 mg/dL (ref 0.50–1.10)
GFR calc non Af Amer: 81 mL/min — ABNORMAL LOW (ref >90)
Sodium: 131 mEq/L — ABNORMAL LOW (ref 135–145)

## 2013-10-26 LAB — CBC
MCH: 30.4 pg (ref 26.0–34.0)
MCHC: 35.1 g/dL (ref 30.0–36.0)
MCV: 86.6 fL (ref 78.0–100.0)
Platelets: 25 10*3/uL — CL (ref 150–400)
RBC: 2.83 MIL/uL — ABNORMAL LOW (ref 3.87–5.11)
RDW: 11.5 % (ref 11.5–15.5)
WBC: 5 10*3/uL (ref 4.0–10.5)

## 2013-10-26 MED ORDER — POTASSIUM CHLORIDE CRYS ER 20 MEQ PO TBCR
20.0000 meq | EXTENDED_RELEASE_TABLET | Freq: Two times a day (BID) | ORAL | Status: DC
  Administered 2013-10-26 – 2013-10-27 (×3): 20 meq via ORAL
  Filled 2013-10-26 (×5): qty 1

## 2013-10-26 NOTE — Progress Notes (Signed)
Department of Radiation Oncology  Phone:  [phone] Fax:        [phone]  INPATIENT   Weekly Treatment Note    Name: Cindy [name] Date: 10/26/2013 MRN: [id number] DOB: 07/05/1930   Current dose: 19.8 Gy    MEDICATIONS: No current facility-administered medications for this encounter.   No current outpatient prescriptions on file.   Facility-Administered Medications Ordered in Other Encounters  Medication Dose Route Frequency Provider Last Rate Last Dose  . acetaminophen (TYLENOL) tablet 650 mg  650 mg Oral Q6H PRN Catarina Hartshorn, MD       Or  . acetaminophen (TYLENOL) suppository 650 mg  650 mg Rectal Q6H PRN Catarina Hartshorn, MD      . ALPRAZolam Prudy Feeler) tablet 0.25 mg  0.25 mg Oral TID Catarina Hartshorn, MD   0.25 mg at 10/26/13 1557  . FLUoxetine (PROZAC) capsule 40 mg  40 mg Oral q morning - 10a Catarina Hartshorn, MD   40 mg at 10/26/13 1005  . HYDROcodone-acetaminophen (NORCO/VICODIN) 5-325 MG per tablet 1 tablet  1 tablet Oral Q4H PRN Catarina Hartshorn, MD   1 tablet at 10/23/13 1848  . imipenem-cilastatin (PRIMAXIN) 250 mg in sodium chloride 0.9 % 100 mL IVPB  250 mg Intravenous Q6H Catarina Hartshorn, MD   250 mg at 10/26/13 1558  . levothyroxine (SYNTHROID, LEVOTHROID) tablet 100 mcg  100 mcg Oral QAC breakfast Catarina Hartshorn, MD   100 mcg at 10/26/13 [phone]  . loperamide (IMODIUM) capsule 2-4 mg  2-4 mg Oral PRN Ladene Artist, MD   4 mg at 10/25/13 1230  . magic mouthwash w/lidocaine  10 mL Oral QID PRN Catarina Hartshorn, MD   10 mL at 10/25/13 2145  . magnesium oxide (MAG-OX) tablet 400 mg  400 mg Oral BID Hollice Espy, MD   400 mg at 10/26/13 1005  . multivitamin with minerals tablet 1 tablet  1 tablet Oral Daily Lavena Bullion, RD   1 tablet at 10/26/13 1005  . ondansetron (ZOFRAN) tablet 4 mg  4 mg Oral Q6H PRN Catarina Hartshorn, MD       Or  . ondansetron [facility]) injection 4 mg  4 mg Intravenous Q6H PRN Catarina Hartshorn, MD      . potassium chloride SA (K-DUR,KLOR-CON) CR tablet 20 mEq  20 mEq Oral BID Maryruth Bun Rama, MD   20 mEq at 10/26/13 1005  . sodium chloride 0.9 % 1,000 mL with potassium chloride 20 mEq infusion   Intravenous Continuous Catarina Hartshorn, MD 75 mL/hr at 10/26/13 1552    . vancomycin (VANCOCIN) 500 mg in sodium chloride 0.9 % 100 mL IVPB  500 mg Intravenous Q12H Colleen E Summe, RPH   500 mg at 10/26/13 1058  . zolpidem (AMBIEN) tablet 5 mg  5 mg Oral QHS PRN Catarina Hartshorn, MD   5 mg at 10/25/13 2140     ALLERGIES: Morphine and related   LABORATORY DATA:  Lab Results  Component Value Date   WBC 5.0 10/26/2013   HGB 8.6* 10/26/2013   HCT 24.5* 10/26/2013   MCV 86.6 10/26/2013   PLT 25* 10/26/2013   Lab Results  Component Value Date   NA 131* 10/26/2013   K 3.2* 10/26/2013   CL 98 10/26/2013   CO2 24 10/26/2013   Lab Results  Component Value Date   ALT 21 10/23/2013   AST 20 10/23/2013   ALKPHOS 53 10/23/2013   BILITOT 0.4 10/23/2013     NARRATIVE: Cindy Sale  [name] was seen today for weekly treatment management. The chart was checked and the patient's films were reviewed. The patient is resuming her treatment this week. Her blood counts have been improving. No significant problems with radiation at this point. Her dominant complaint was mucositis. This should continue to improve.  PHYSICAL EXAMINATION:   Alert, in no acute distress. Lying in a hospital bed.  ASSESSMENT: The patient is doing satisfactorily with treatment.  PLAN: We will continue with the patient's radiation treatment as planned.

## 2013-10-26 NOTE — Progress Notes (Signed)
[facility] PROGRESS NOTE  Cindy [name]:[id number] DOB: 08-17-1930 DOA: 10/23/2013 PCP: Gaye Alken, MD  Brief narrative: Cindy [name] is an 79 y.o. female PMH of squamous cell carcinoma of the rectum diagnosed in 08/2013 status post her first cycle of  5-FU/mitomycin C. beginning 10/10/2013, undergoing radiation treatment, who was admitted on 10/23/2013 with fever despite being on empiric Levaquin starting 10/19/2013. Upon initial evaluation in the ED, the patient was found to be neutropenic/pancytopenia and hypokalemic.   Assessment/Plan: Principal Problem:   Febrile neutropenia The patient was admitted and 2 sets of blood cultures were obtained. Urinalysis with culture was also obtained. She was put on empiric vancomycin and imipenem. She was started on Neupogen to address the neutropenia. Chest x-ray showed bronchitic changes but no obvious pneumonia. C. difficile PCR studies were negative. Blood and urine cultures are negative to date. Neutropenia resolved by 10/25/2013. Neupogen subsequently discontinued. Continue empiric antibiotics. Afebrile. Active Problems:   Mucositis Continue Magic mouthwash.   Hyponatremia Thought to be secondary to volume depletion. Her sodium has steadily improved. Continue IV fluids.   Hypokalemia / hypomagnesemia Secondary to GI losses/loose stools and renal wasting secondary to prior chemotherapy. Monitor and replace electrolytes as needed.   Hypothyroidism Continue Synthroid.   Squamous cell carcinoma of the rectum Status post chemotherapy 10/15/2013. Being followed by Dr. Truett Perna.  Continue radiation treatment under the care of Dr. Mitzi Hansen.  Protein-calorie malnutrition, severe The patient was seen and evaluated by dietitian on 10/24/2013. Continue Ensure supplements. We'll check phosphorus given risk of refeeding syndrome. Continue multivitamin.   Thrombocytopenia No evidence of bleeding.  Platelet count beginning to improve.   Diarrhea C. difficile PCR negative. Continue Imodium as needed.  Code Status: Full code. Family Communication: Son updated at bedside. Disposition Plan: Home when stable.   IV access:  Peripheral IV  Medical Consultants:  Dr. Mancel Bale, Oncology  Dr. Antony Blackbird, Radiation Oncology  Other Consultants:  Dietitian  Anti-infectives:  Levaquin 10/20/2013---> 10/23/2013  Primaxin 10/23/2013---->  Vancomycin 10/23/2013--->  HPI/Subjective: Cindy [name] denies shortness of breath. Occasional dry cough. Appetite fair. No complaints of bleeding gums, blood in the urine or stools. Still complains of diarrhea stools.  Objective: Filed Vitals:   10/24/13 2014 10/25/13 0431 10/25/13 1611 10/25/13 2329  BP: 150/62 146/58 110/80 137/53  Pulse: 85 84 83 82  Temp: 98.4 F (36.9 C) 98.2 F (36.8 C) 98.7 F (37.1 C) 98.7 F (37.1 C)  TempSrc: Oral Oral Oral Oral  Resp: 17 16 16 16   Height:      Weight:      SpO2: 97% 96% 98% 95%    Intake/Output Summary (Last 24 hours) at 10/26/13 0720 Last data filed at 10/26/13 0600  Gross per 24 hour  Intake   2280 ml  Output    600 ml  Net   1680 ml    Exam: Gen:  NAD Cardiovascular:  RRR, No M/R/G Respiratory:  Lungs CTAB Gastrointestinal:  Abdomen soft, NT/ND, + BS Extremities:  No C/E/C  Data Reviewed: Basic Metabolic Panel:  Recent Labs Lab 10/19/13 1439  10/23/13 0816 10/24/13 0410 10/25/13 0333 10/26/13 0336  NA 128*  --  126* 128* 132* 131*  K 3.5  < > 2.7* 3.3* 3.6 3.2*  CL  --   --  91* 98 102 98  CO2 22  --  24 22 22 24   GLUCOSE 115  --  140* 91 92 98  BUN 12.3  --  5* 6 5*  5*  CREATININE 0.8  --  0.57 0.65 0.59 0.65  CALCIUM 9.0  --  8.7 8.4 8.5 8.6  MG  --   --   --  1.4* 1.6  --   < > = values in this interval not displayed. GFR Estimated Creatinine Clearance: 49.5 ml/min (by C-G formula based on Cr of 0.65). Liver Function Tests:  Recent Labs Lab 10/23/13 0816  AST 20  ALT 21  ALKPHOS  53  BILITOT 0.4  PROT 6.5  ALBUMIN 2.7*   CBC:  Recent Labs Lab 10/19/13 1325 10/21/13 1324 10/23/13 0816 10/24/13 0410 10/25/13 0333 10/26/13 0336  WBC 0.5* 0.3* 0.1* 0.9* 4.4 5.0  NEUTROABS 0.3* 0.1* 0.0* 0.5* 3.8  --   HGB 10.5* 10.1* 9.9* 9.0* 8.9* 8.6*  HCT 29.9* 28.7* 27.6* 25.2* 25.0* 24.5*  MCV 87.7 87.0 86.3 86.6 86.8 86.6  PLT 104* 37* 13* 10* 13* 25*   Microbiology Recent Results (from the past 240 hour(s))  CULTURE, BLOOD (ROUTINE X 2)     Status: None   Collection Time    10/23/13  8:10 AM      Result Value Range Status   Specimen Description BLOOD RIGHT ARM   Final   Special Requests BOTTLES DRAWN AEROBIC AND ANAEROBIC 5CC EACH   Final   Culture  Setup Time     Final   Value: 10/23/2013 20:57     Performed at [facility]   Culture     Final   Value:        BLOOD CULTURE RECEIVED NO GROWTH TO DATE CULTURE WILL BE HELD FOR 5 DAYS BEFORE ISSUING A FINAL NEGATIVE REPORT     Performed at [facility]   Report Status PENDING   Incomplete  CULTURE, BLOOD (ROUTINE X 2)     Status: None   Collection Time    10/23/13  8:40 AM      Result Value Range Status   Specimen Description BLOOD RIGHT ANTECUBITAL   Final   Special Requests BOTTLES DRAWN AEROBIC AND ANAEROBIC 3 CC EA   Final   Culture  Setup Time     Final   Value: 10/23/2013 20:57     Performed at [facility]   Culture     Final   Value:        BLOOD CULTURE RECEIVED NO GROWTH TO DATE CULTURE WILL BE HELD FOR 5 DAYS BEFORE ISSUING A FINAL NEGATIVE REPORT     Performed at [facility]   Report Status PENDING   Incomplete  URINE CULTURE     Status: None   Collection Time    10/23/13  8:59 AM      Result Value Range Status   Specimen Description URINE, CLEAN CATCH   Final   Special Requests levaquin Immunocompromised   Final   Culture  Setup Time     Final   Value: 10/24/2013 10:05     Performed at [facility] Count     Final   Value: NO GROWTH      Performed at [facility]   Culture     Final   Value: NO GROWTH     Performed at [facility]   Report Status 10/25/2013 FINAL   Final  CLOSTRIDIUM DIFFICILE BY PCR     Status: None   Collection Time    10/23/13  7:06 PM      Result Value Range Status  C difficile by pcr NEGATIVE  NEGATIVE Final   Comment: Performed at [facility]     Procedures and Diagnostic Studies: Dg Chest 2 View  10/23/2013   CLINICAL DATA:  Fever, weakness, hypertension.  EXAM: CHEST  2 VIEW  COMPARISON:  10/17/2012  FINDINGS: The heart size is normal. There is mild perihilar bronchial thickening. No focal consolidations or pleural effusions. No pulmonary edema.Surgical clips are identified in the upper abdomen. There is mild thoracic degenerative change.  IMPRESSION: 1. Bronchitic changes. 2.  No evidence for acute cardiopulmonary abnormality.   Electronically Signed   By: Rosalie Gums M.D.   On: 10/23/2013 09:33   Scheduled Meds: . ALPRAZolam  0.25 mg Oral TID  . FLUoxetine  40 mg Oral q morning - 10a  . imipenem-cilastatin  250 mg Intravenous Q6H  . levothyroxine  100 mcg Oral QAC breakfast  . magnesium oxide  400 mg Oral BID  . multivitamin with minerals  1 tablet Oral Daily  . vancomycin  500 mg Intravenous Q12H   Continuous Infusions: . sodium chloride 0.9 % 1,000 mL with potassium chloride 20 mEq infusion 75 mL/hr at 10/25/13 2135    Time spent: 25 minutes.   LOS: 3 days   Ashliegh Parekh  [facility] Pager [phone].   *Please note that the hospitalists switch teams on Wednesdays. Please call the flow manager at [phone] if you are having difficulty reaching the hospitalist taking care of this patient as she can update you and provide the most up-to-date pager number of provider caring for the patient. If 8PM-8AM, please contact night-coverage at [email or web address], password [facility]  10/26/2013, 7:20 AM

## 2013-10-26 NOTE — Progress Notes (Signed)
[facility] Radiation Oncology Dept Therapy Treatment Record Phone [phone]   Radiation Therapy was administered to Cindy [name] on: 10/26/2013  3:01 PM and was treatment # 11 out of a planned course of 30 treatments.

## 2013-10-26 NOTE — Progress Notes (Signed)
Patient seen i the back at treatment machine linnac#4, not sent to nursing for assessment 3:44 PM

## 2013-10-26 NOTE — Care Management Note (Signed)
Cm spoke with patient at the bedside with adult son present concerning dc planning. Pt lives alone.PCP: Gaye Alken, MD. Uses [facility]. Pt states being independent prior to hospital admission. Pt has RW and cane for home DME use. Awaiting PT eval to further assess dc needs.    Roxy Manns Romel Dumond,RN,MSN [phone]

## 2013-10-26 NOTE — Progress Notes (Signed)
ANTIBIOTIC CONSULT NOTE - FOLLOW UP  Pharmacy Consult for vancomycin Indication: febrile neutropenia  Allergies  Allergen Reactions  . Morphine And Related Hives    Patient Measurements: Height: 5' 4.96" (165 cm) Weight: 146 lb 9.7 oz (66.5 kg) IBW/kg (Calculated) : 56.91   Vital Signs: Temp: 98.2 F (36.8 C) (11/05 0659) Temp src: Oral (11/05 0659) BP: 136/62 mmHg (11/05 0659) Pulse Rate: 75 (11/05 0659) Intake/Output from previous day: 11/04 0701 - 11/05 0700 In: 2280 [P.O.:480; I.V.:1800] Out: 750 [Urine:750] Intake/Output from this shift: Total I/O In: 240 [P.O.:240] Out: 200 [Urine:200]  Labs:  Recent Labs  10/24/13 0410 10/25/13 0333 10/26/13 0336  WBC 0.9* 4.4 5.0  HGB 9.0* 8.9* 8.6*  PLT 10* 13* 25*  CREATININE 0.65 0.59 0.65   Estimated Creatinine Clearance: 49.5 ml/min (by C-G formula based on Cr of 0.65).  Antiinfectives: 10/29 (from PTA) >> Levaquin >> 11/1 11/2 >> Primaxin >> 11/2 >> Vancomycin >>   Microbiology: 11/2 Blood x2: ngtd 11/2 urine: NGF 110/2: c.diff PCR: negative  Assessment: 59 yoF with history of squamous cell carcinoma of anus s/p cycle 1 5-FU/mitomycin on 10/20 admitted 11/2 with febrile neutropenia and pancytopenia. Pt started on levaquin 10/29 as an out-pt. Pharmacy has been consulted to dose vancomycin for febrile neutropenia. Neupogen was administered 11/3 and 11/4  Today is D4 vancomycin and Primaxin  Patient has been afebrile since admission, no acetaminophen has been administered since 11/2  WBC have increased to 5.0 (ANC 3.8 on 11/4)  SCr is stable WNL and at this patient's baseline  Culture results as above   Goal of Therapy:  Vancomycin trough level 15-20 mcg/ml  Plan:  - continue vancomycin 500mg  IV q12h - continue Primaxin 250mg  q8h - follow-up culture results, clinical course, renal function - follow-up antibiotic de-escalation and length of therapy

## 2013-10-26 NOTE — Progress Notes (Signed)
NUTRITION FOLLOW UP  Intervention:   - D/C Ensure Complete - Assisted pt with ordering meals. Encouraged increased meal intake. Provided sample of Resource Breeze.  - Recommend monitoring and replacement of potassium, magnesium, and phosphorus now that pt's intake improved as pt at risk of refeeding syndrome r/t minimal intake x 1 month with frequent daily diarrhea  - Will continue to monitor   Nutrition Dx:   Inadequate oral intake related to mouth sores, diarrhea, poor appetite as evidenced by <10% meal intake - improving to 50-100% meal intake   Goal:   Pt to consume >90% of meals/supplements - not met consistently   Monitor:   Weights, labs, intake, diarrhea, sore throat, mucositis   Assessment:   Pt with rectal squamous cell CA, admitted with fever and cough. Pt reports having mouth sores and diarrhea for the past month since getting chemotherapy and radiation. Reports minimal intake PTA r/t having diarrhea within 20 minutes after taking a few bites of food.  Pt discussed during multidisciplinary rounds.   Imodium started yesterday to help with diarrhea. Negative for C. Difficile. Received 10 out of 30 radiation treatments yesterday. Met with pt who reports her mucositis and mouth pain are better but she is still c/o sore throat and having diarrhea within 20 minutes after eating. Refused Ensure Complete yesterday, does not like. Provided pt with sample of Resource Breeze to try. Pt still c/o lack of appetite however ate 100% of reuben sandwich for lunch yesterday and 50% of macaroni and cheese, greens, and dressing.   Potassium low and phosphorus slightly low, magnesium WNL. Potassium normal yesterday. Getting daily multivitamin and oral potassium and magnesium replacement. Sodium low. No new weights.   Height: Ht Readings from Last 1 Encounters:  10/23/13 5' 4.96" (1.65 m)    Weight Status:   Wt Readings from Last 1 Encounters:  10/23/13 146 lb 9.7 oz (66.5 kg)     Re-estimated needs:  Kcal: 1650-1750  Protein: 80-90g  Fluid: 1.6-1.7L/day  Skin: Intact  Diet Order: General   Intake/Output Summary (Last 24 hours) at 10/26/13 1036 Last data filed at 10/26/13 0600  Gross per 24 hour  Intake   2280 ml  Output    600 ml  Net   1680 ml    Last BM: 11/4, diarrhea   Labs:   Recent Labs Lab 10/23/13 0816 10/24/13 0410 10/25/13 0333 10/26/13 0336  NA 126* 128* 132* 131*  K 2.7* 3.3* 3.6 3.2*  CL 91* 98 102 98  CO2 24 22 22 24   BUN 5* 6 5* 5*  CREATININE 0.57 0.65 0.59 0.65  CALCIUM 8.7 8.4 8.5 8.6  MG  --  1.4* 1.6  --   PHOS  --   --   --  2.2*  GLUCOSE 140* 91 92 98    CBG (last 3)  No results found for this basename: GLUCAP,  in the last 72 hours  Scheduled Meds: . ALPRAZolam  0.25 mg Oral TID  . FLUoxetine  40 mg Oral q morning - 10a  . imipenem-cilastatin  250 mg Intravenous Q6H  . levothyroxine  100 mcg Oral QAC breakfast  . magnesium oxide  400 mg Oral BID  . multivitamin with minerals  1 tablet Oral Daily  . potassium chloride  20 mEq Oral BID  . vancomycin  500 mg Intravenous Q12H    Continuous Infusions: . sodium chloride 0.9 % 1,000 mL with potassium chloride 20 mEq infusion 75 mL/hr at 10/25/13 2135  Krishawna Stiefel Baron MS, RD, LDN [phone] Pager [phone] After Hours Pager  

## 2013-10-27 ENCOUNTER — Ambulatory Visit
Admission: RE | Admit: 2013-10-27 | Discharge: 2013-10-27 | Disposition: A | Discharge status: Home / Self Care | Payer: Medicare Other | Source: Ambulatory Visit | Attending: Radiation Oncology | Admitting: Radiation Oncology

## 2013-10-27 DIAGNOSIS — C210 Malignant neoplasm of anus, unspecified: Secondary | ICD-10-CM | POA: Diagnosis not present

## 2013-10-27 DIAGNOSIS — K1230 Oral mucositis (ulcerative), unspecified: Secondary | ICD-10-CM

## 2013-10-27 HISTORY — DX: Other disorders of phosphorus metabolism: E83.39

## 2013-10-27 HISTORY — DX: Oral mucositis (ulcerative), unspecified: K12.30

## 2013-10-27 LAB — BASIC METABOLIC PANEL
CO2: 22 mEq/L (ref 19–32)
Calcium: 8.3 mg/dL — ABNORMAL LOW (ref 8.4–10.5)
Creatinine, Ser: 0.56 mg/dL (ref 0.50–1.10)
GFR calc Af Amer: >90 mL/min (ref >90)
GFR calc non Af Amer: 85 mL/min — ABNORMAL LOW (ref >90)

## 2013-10-27 LAB — CBC
MCH: 30.1 pg (ref 26.0–34.0)
MCHC: 35 g/dL (ref 30.0–36.0)
MCV: 86 fL (ref 78.0–100.0)
Platelets: 39 10*3/uL — ABNORMAL LOW (ref 150–400)
RBC: 2.79 MIL/uL — ABNORMAL LOW (ref 3.87–5.11)
RDW: 11.7 % (ref 11.5–15.5)

## 2013-10-27 LAB — MAGNESIUM: Magnesium: 1.5 mg/dL (ref 1.5–2.5)

## 2013-10-27 MED ORDER — K PHOS MONO-SOD PHOS DI & MONO 155-852-130 MG PO TABS
250.0000 mg | ORAL_TABLET | Freq: Three times a day (TID) | ORAL | Status: DC
  Administered 2013-10-27 (×2): 250 mg via ORAL
  Filled 2013-10-27 (×3): qty 1

## 2013-10-27 MED ORDER — LEVOFLOXACIN 500 MG PO TABS: 500.0000 mg | ORAL_TABLET | Freq: Every day | ORAL | Status: DC

## 2013-10-27 MED ORDER — MAGIC MOUTHWASH W/LIDOCAINE: 10.0000 mL | Freq: Four times a day (QID) | ORAL | Status: DC | PRN

## 2013-10-27 MED ORDER — POTASSIUM CHLORIDE CRYS ER 20 MEQ PO TBCR: 20.0000 meq | EXTENDED_RELEASE_TABLET | Freq: Every day | ORAL | Status: DC

## 2013-10-27 MED ORDER — ADULT MULTIVITAMIN W/MINERALS CH: 1.0000 | ORAL_TABLET | Freq: Every day | ORAL | Status: DC

## 2013-10-27 MED ORDER — K PHOS MONO-SOD PHOS DI & MONO 155-852-130 MG PO TABS: 250.0000 mg | ORAL_TABLET | Freq: Three times a day (TID) | ORAL | Status: DC

## 2013-10-27 MED ORDER — LOPERAMIDE HCL 2 MG PO CAPS: 2.0000 mg | ORAL_CAPSULE | ORAL | Status: DC | PRN

## 2013-10-27 MED ORDER — LEVOFLOXACIN IN D5W 500 MG/100ML IV SOLN
500.0000 mg | INTRAVENOUS | Status: DC
  Administered 2013-10-27: 500 mg via INTRAVENOUS
  Filled 2013-10-27: qty 100

## 2013-10-27 MED ORDER — ZOLPIDEM TARTRATE 5 MG PO TABS: 5.0000 mg | ORAL_TABLET | Freq: Every evening | ORAL | Status: DC | PRN

## 2013-10-27 NOTE — Care Management Note (Signed)
Per pt's choice [facility] to provide [facility] services at discharge. [facility] rep [employer] notified.    Roxy Manns Quintavius Niebuhr,RN,MSN [phone]

## 2013-10-27 NOTE — Evaluation (Signed)
Physical Therapy Evaluation Patient Details Name: Cindy [name] MRN: [id number] DOB: 1930/09/04 Today's Date: 10/27/2013 Time: 2952-8413 PT Time Calculation (min): 19 min  PT Assessment / Plan / Recommendation History of Present Illness  79 yo female admitted with febrile neutropenia, weakness. Hx of rectal cancer.   Clinical Impression  On eval, pt required Min-Min guard assist for mobility-able to ambulate ~200 feet. Demonstrates general weakness, decreased activity tolerance, unsteady gait and balance. Recommend HHPT. Instructed pt to use walker for now. Also instructed pt to wait until PT evaluates pt and assesses her safety with ADLs before attempting to get into/out of bathtub alone. Pt in agreement with these recommendations.     PT Assessment  All further PT needs can be met in the next venue of care    Follow Up Recommendations  Home health PT;Supervision - Intermittent    Does the patient have the potential to tolerate intense rehabilitation      Barriers to Discharge        Equipment Recommendations  None recommended by PT    Recommendations for Other Services     Frequency      Precautions / Restrictions Precautions Precautions: Fall Restrictions Weight Bearing Restrictions: No   Pertinent Vitals/Pain No c/o pain      Mobility  Bed Mobility Bed Mobility: Sit to Supine Sit to Supine: 6: Modified independent (Device/Increase time) Transfers Transfers: Sit to Stand;Stand to Sit Sit to Stand: 5: Supervision;From bed Stand to Sit: 5: Supervision;To bed Ambulation/Gait Ambulation/Gait Assistance: 4: Min assist;4: Min guard Ambulation Distance (Feet): 200 Feet Assistive device: None Ambulation/Gait Assistance Details: Min assist intermittently to steady. slow gait speed.  Gait Pattern: Step-through pattern;Decreased stride length Stairs: Yes Stairs Assistance: 4: Min guard Stairs Assistance Details (indicate cue type and reason): VCs safety. close guard  for safety. Encouraged pt to use step to pattern if she needs to  Stair Management Technique: One rail Right;Forwards;Alternating pattern Number of Stairs: 5    Exercises     PT Diagnosis: Difficulty walking;Generalized weakness  PT Problem List: Decreased strength;Decreased activity tolerance;Decreased balance;Decreased mobility;Decreased knowledge of use of DME PT Treatment Interventions:       PT Goals(Current goals can be found in the care plan section) Acute Rehab PT Goals PT Goal Formulation: No goals set, d/c therapy (pt to d/c home today-1x eval)  Visit Information  Last PT Received On: 10/27/13 Assistance Needed: +1 History of Present Illness: 79 yo female admitted with febrile neutropenia, weakness. Hx of rectal cancer.        Prior Functioning  Home Living Family/patient expects to be discharged to:: Private residence Home Access: Stairs to enter Home Equipment: Gilmer Mor - single point Prior Function Level of Independence: Independent    Cognition  Cognition Arousal/Alertness: Awake/[name] Behavior During Therapy: WFL for tasks assessed/performed Overall Cognitive Status: Within Functional Limits for tasks assessed    Extremity/Trunk Assessment Upper Extremity Assessment Upper Extremity Assessment: Generalized weakness Lower Extremity Assessment Lower Extremity Assessment: Generalized weakness Cervical / Trunk Assessment Cervical / Trunk Assessment: Normal   Balance Balance Balance Assessed: Yes Static Standing Balance Static Standing - Balance Support: No upper extremity supported Static Standing - Level of Assistance: 5: Stand by assistance Static Standing - Comment/# of Minutes: Performed EO/EC,narrow BOS, and withstanding of external perturbations-noted increaed sway and near LOB when pt was pushed slightly towards R and posterolaterally Dynamic Standing Balance Dynamic Standing - Balance Support: No upper extremity supported Dynamic Standing - Comments:  360 degree turn and p/u  object-no LOB. Increased time noted High Level Balance High Level Balance Activites: Backward walking;Direction changes;Turns;Sudden stops;Head turns High Level Balance Comments: slightly unsteady  End of Session PT - End of Session Equipment Utilized During Treatment: Gait belt Activity Tolerance: Patient limited by fatigue Patient left: in bed;with call bell/phone within reach;with family/visitor present  GP     Cindy [name], MPT Pager: [phone]

## 2013-10-27 NOTE — Progress Notes (Signed)
[facility] PROGRESS NOTE  [name]:[id number] DOB: 06/13/1930 DOA: 10/23/2013 PCP: Gaye Alken, MD  Brief narrative: Cindy [name] is an 79 y.o. female PMH of squamous cell carcinoma of the rectum diagnosed in 08/2013 status post her first cycle of  5-FU/mitomycin C. beginning 10/10/2013, undergoing radiation treatment, who was admitted on 10/23/2013 with fever despite being on empiric Levaquin starting 10/19/2013. Upon initial evaluation in the ED, the patient was found to be neutropenic/pancytopenia and hypokalemic.   Assessment/Plan: Principal Problem:   Febrile neutropenia The patient was admitted and 2 sets of blood cultures were obtained. Urinalysis with culture was also obtained. She was put on empiric vancomycin and imipenem. She was started on Neupogen to address the neutropenia. Chest x-ray showed bronchitic changes but no obvious pneumonia. C. difficile PCR studies were negative. Blood and urine cultures are negative to date. Neutropenia resolved by 10/25/2013. Neupogen subsequently discontinued. Continue empiric antibiotics. Afebrile. Active Problems:   Mucositis Continue Magic mouthwash.   Hyponatremia Thought to be secondary to volume depletion. Her sodium has steadily improved. Continue IV fluids.   Hypokalemia / hypomagnesemia / hypophosphatemia Secondary to GI losses/loose stools and renal wasting secondary to prior chemotherapy. Monitor and replace electrolytes as needed.   Hypothyroidism Continue Synthroid.   Squamous cell carcinoma of the rectum Status post chemotherapy 10/15/2013. Being followed by Dr. Truett Perna.  Continue radiation treatment under the care of Dr. Mitzi Hansen.  Protein-calorie malnutrition, severe The patient was seen and evaluated by dietitian on 10/24/2013. Continue Ensure supplements. Phosphorus low, consistent with refeeding syndrome. Continue multivitamin.   Thrombocytopenia No evidence of bleeding.  Platelet count beginning  to improve.   Diarrhea C. difficile PCR negative. Continue Imodium as needed.  Code Status: Full code. Family Communication: Son updated at bedside. Disposition Plan: Home when stable.   IV access:  Peripheral IV  Medical Consultants:  Dr. Mancel Bale, Oncology  Dr. Antony Blackbird, Radiation Oncology  Other Consultants:  Dietitian  Anti-infectives:  Levaquin 10/20/2013---> 10/23/2013  Primaxin 10/23/2013---->  Vancomycin 10/23/2013--->  HPI/Subjective: Cindy [name] denies shortness of breath. Occasional dry cough. Appetite is improving. No complaints of bleeding gums, blood in the urine or stools. Diarrhea is slowing down.  Objective: Filed Vitals:   10/26/13 0659 10/26/13 1332 10/26/13 2035 10/27/13 0640  BP: 136/62 130/80 138/68 147/64  Pulse: 75 71 77 78  Temp: 98.2 F (36.8 C) 99.2 F (37.3 C) 99.1 F (37.3 C) 98.2 F (36.8 C)  TempSrc: Oral Oral Oral Oral  Resp: 16 16 16 16   Height:      Weight:      SpO2: 95% 99% 94% 97%    Intake/Output Summary (Last 24 hours) at 10/27/13 0732 Last data filed at 10/27/13 0640  Gross per 24 hour  Intake   7532 ml  Output    750 ml  Net   6782 ml    Exam: Gen:  NAD Cardiovascular:  RRR, No M/R/G Respiratory:  Lungs CTAB Gastrointestinal:  Abdomen soft, NT/ND, + BS Extremities:  No C/E/C  Data Reviewed: Basic Metabolic Panel:  Recent Labs Lab 10/23/13 0816 10/24/13 0410 10/25/13 0333 10/26/13 0336 10/27/13 0545  NA 126* 128* 132* 131* 133*  K 2.7* 3.3* 3.6 3.2* 3.4*  CL 91* 98 102 98 101  CO2 24 22 22 24 22   GLUCOSE 140* 91 92 98 100*  BUN 5* 6 5* 5* 4*  CREATININE 0.57 0.65 0.59 0.65 0.56  CALCIUM 8.7 8.4 8.5 8.6 8.3*  MG  --  1.4* 1.6  --  1.5  PHOS  --   --   --  2.2*  --    GFR Estimated Creatinine Clearance: 49.5 ml/min (by C-G formula based on Cr of 0.56). Liver Function Tests:  Recent Labs Lab 10/23/13 0816  AST 20  ALT 21  ALKPHOS 53  BILITOT 0.4  PROT 6.5  ALBUMIN 2.7*    CBC:  Recent Labs Lab 10/21/13 1324 10/23/13 0816 10/24/13 0410 10/25/13 0333 10/26/13 0336 10/27/13 0545  WBC 0.3* 0.1* 0.9* 4.4 5.0 2.3*  NEUTROABS 0.1* 0.0* 0.5* 3.8  --   --   HGB 10.1* 9.9* 9.0* 8.9* 8.6* 8.4*  HCT 28.7* 27.6* 25.2* 25.0* 24.5* 24.0*  MCV 87.0 86.3 86.6 86.8 86.6 86.0  PLT 37* 13* 10* 13* 25* 39*   Microbiology Recent Results (from the past 240 hour(s))  CULTURE, BLOOD (ROUTINE X 2)     Status: None   Collection Time    10/23/13  8:10 AM      Result Value Range Status   Specimen Description BLOOD RIGHT ARM   Final   Special Requests BOTTLES DRAWN AEROBIC AND ANAEROBIC 5CC EACH   Final   Culture  Setup Time     Final   Value: 10/23/2013 20:57     Performed at [facility]   Culture     Final   Value:        BLOOD CULTURE RECEIVED NO GROWTH TO DATE CULTURE WILL BE HELD FOR 5 DAYS BEFORE ISSUING A FINAL NEGATIVE REPORT     Performed at [facility]   Report Status PENDING   Incomplete  CULTURE, BLOOD (ROUTINE X 2)     Status: None   Collection Time    10/23/13  8:40 AM      Result Value Range Status   Specimen Description BLOOD RIGHT ANTECUBITAL   Final   Special Requests BOTTLES DRAWN AEROBIC AND ANAEROBIC 3 CC EA   Final   Culture  Setup Time     Final   Value: 10/23/2013 20:57     Performed at [facility]   Culture     Final   Value:        BLOOD CULTURE RECEIVED NO GROWTH TO DATE CULTURE WILL BE HELD FOR 5 DAYS BEFORE ISSUING A FINAL NEGATIVE REPORT     Performed at [facility]   Report Status PENDING   Incomplete  URINE CULTURE     Status: None   Collection Time    10/23/13  8:59 AM      Result Value Range Status   Specimen Description URINE, CLEAN CATCH   Final   Special Requests levaquin Immunocompromised   Final   Culture  Setup Time     Final   Value: 10/24/2013 10:05     Performed at [facility] Count     Final   Value: NO GROWTH     Performed at [facility]   Culture      Final   Value: NO GROWTH     Performed at [facility]   Report Status 10/25/2013 FINAL   Final  CLOSTRIDIUM DIFFICILE BY PCR     Status: None   Collection Time    10/23/13  7:06 PM      Result Value Range Status   C difficile by pcr NEGATIVE  NEGATIVE Final   Comment: Performed at [facility]  Procedures and Diagnostic Studies: Dg Chest 2 View  10/23/2013   CLINICAL DATA:  Fever, weakness, hypertension.  EXAM: CHEST  2 VIEW  COMPARISON:  10/17/2012  FINDINGS: The heart size is normal. There is mild perihilar bronchial thickening. No focal consolidations or pleural effusions. No pulmonary edema.Surgical clips are identified in the upper abdomen. There is mild thoracic degenerative change.  IMPRESSION: 1. Bronchitic changes. 2.  No evidence for acute cardiopulmonary abnormality.   Electronically Signed   By: Rosalie Gums M.D.   On: 10/23/2013 09:33   Scheduled Meds: . ALPRAZolam  0.25 mg Oral TID  . FLUoxetine  40 mg Oral q morning - 10a  . imipenem-cilastatin  250 mg Intravenous Q6H  . levothyroxine  100 mcg Oral QAC breakfast  . magnesium oxide  400 mg Oral BID  . multivitamin with minerals  1 tablet Oral Daily  . potassium chloride  20 mEq Oral BID  . vancomycin  500 mg Intravenous Q12H   Continuous Infusions: . sodium chloride 0.9 % 1,000 mL with potassium chloride 20 mEq infusion 75 mL/hr at 10/26/13 1552    Time spent: 25 minutes.   LOS: 4 days   RAMA,CHRISTINA  [facility] Pager [phone].   *Please note that the hospitalists switch teams on Wednesdays. Please call the flow manager at [phone] if you are having difficulty reaching the hospitalist taking care of this patient as she can update you and provide the most up-to-date pager number of provider caring for the patient. If 8PM-8AM, please contact night-coverage at [email or web address], password [facility]  10/27/2013, 7:32 AM    Information printed out, explained, and given to the patient:  In  an effort to keep you and your family informed about your hospital stay, I am providing you with this information sheet. If you or your family have any questions, please do not hesitate to have the nursing staff page me to set up a meeting time.  Cindy Sale Roediger 10/27/2013 4 (Number of days in the hospital)  Treatment team:  Dr. Hillery Aldo, Hospitalist (Internist)  Dr. Mancel Bale, Oncology   Dr. Antony Blackbird, Radiation Oncology  Active Treatment Issues with Plan: Principal Problem:   Fever with low white blood cell count from chemotherapy You are being treated with broad-spectrum antibiotics: vancomycin and imipenem. She received Neupogen to address the low white blood cell count. Chest x-ray showed bronchitic changes but no obvious pneumonia. C. difficile PCR studies were negative. Blood and urine cultures are negative to date. Neutropenia resolved by 10/25/2013. Neupogen subsequently discontinued. We can switch her to oral antibiotics and send her home if you're feeling well enough. Active Problems:   Mouth sores Continue Magic mouthwash.   Electrolyte imbalance Secondary to diarrhea. We have been supplementing her electrolytes.   Under active thyroid Continue Synthroid.   Squamous cell carcinoma of the rectum Status post chemotherapy 10/15/2013. Being followed by Dr. Truett Perna.  Continue radiation treatment under the care of Dr. Mitzi Hansen.  Malnourishment You were seen and evaluated by dietitian on 10/24/2013. Continue Ensure supplements. Continue multivitamin.   Low platelet count No evidence of bleeding.  Platelet count beginning to improve.   Diarrhea C. difficile PCR negative. Continue Imodium as needed.   Anticipated discharge date: Today or tomorrow depending on how you feel.

## 2013-10-27 NOTE — Discharge Summary (Signed)
Physician Discharge Summary  Cindy [name]:[id number] DOB: [birth year] DOA: 10/23/2013  PCP: Gaye Alken, MD  Admit date: 10/23/2013 Discharge date: 10/27/2013  Recommendations for Outpatient Follow-up:  1. Home health physical therapy and a nursing aide has been set up. 2. Recommend recheck of CBC and potassium, magnesium, and phosphorus in one week.  Discharge Diagnoses:  Principal Problem:    Febrile neutropenia Active Problems:    Hyponatremia    Hypokalemia    Hypothyroidism    Anal cancer    Hypomagnesemia    Protein-calorie malnutrition, severe    Thrombocytopenia    Mucositis    Hypophosphatemia   Discharge Condition: Improved.  Diet recommendation: Regular.  History of present illness:  Cindy [name] is an 79 y.o. female PMH of squamous cell carcinoma of the rectum diagnosed in 08/2013 status post her first cycle of 5-FU/mitomycin C. beginning 10/10/2013, undergoing radiation treatment, who was admitted on 10/23/2013 with fever despite being on empiric Levaquin starting 10/19/2013. Upon initial evaluation in the ED, the patient was found to be neutropenic/pancytopenia and hypokalemic.  Hospital Course by problem:  Febrile neutropenia  The patient was admitted and 2 sets of blood cultures were obtained. Urinalysis with culture was also obtained. She was put on empiric vancomycin and imipenem. She was started on Neupogen to address the neutropenia. Chest x-ray showed bronchitic changes but no obvious pneumonia. C. difficile PCR studies were negative. Blood and urine cultures are negative to date. Neutropenia resolved by 10/25/2013. Neupogen subsequently discontinued. She has been afebrile for the past 48 hours, and we will discharge her on an additional 3 days of therapy with oral Levaquin.  Active Problems:  Mucositis  Treated with Magic mouthwash, reports improvement at discharge.  Hyponatremia  Thought to be secondary to volume depletion.  Her sodium has steadily improved with IV fluids.  Hypokalemia / hypomagnesemia / hypophosphatemia  Secondary to GI losses/loose stools and renal wasting secondary to prior chemotherapy. We monitored and replaced electrolytes as needed. She will be discharged on oral potassium and potassium phosphorus supplementation at discharge and we recommend close followup of her electrolytes. Hypothyroidism  We continued her usual dose of Synthroid.  Squamous cell carcinoma of the rectum  Status post chemotherapy 10/15/2013. Being followed by Dr. Truett Perna. She continues to receive active radiation treatment under the care of Dr. Mitzi Hansen. She will followup with her oncologist post discharge. Protein-calorie malnutrition, severe  The patient was seen and evaluated by dietitian on 10/24/2013. She was provided with Ensure supplements. Phosphorus was checked and found to be low, consistent with refeeding syndrome. Continue multivitamin and phosphorus supplementation at discharge.  Thrombocytopenia  No evidence of bleeding. Platelet count recovering.  Diarrhea  C. difficile PCR negative. Continue Imodium as needed.  Procedures:  None.  Medical Consultants:  Dr. Mancel Bale, Oncology  Dr. Antony Blackbird, Radiation Oncology   Discharge Exam: Filed Vitals:   10/27/13 0640  BP: 147/64  Pulse: 78  Temp: 98.2 F (36.8 C)  Resp: 16   Filed Vitals:   10/26/13 0659 10/26/13 1332 10/26/13 2035 10/27/13 0640  BP: 136/62 130/80 138/68 147/64  Pulse: 75 71 77 78  Temp: 98.2 F (36.8 C) 99.2 F (37.3 C) 99.1 F (37.3 C) 98.2 F (36.8 C)  TempSrc: Oral Oral Oral Oral  Resp: 16 16 16 16   Height:      Weight:      SpO2: 95% 99% 94% 97%    Gen:  NAD Cardiovascular:  RRR, No M/R/G Respiratory: Lungs CTAB  Gastrointestinal: Abdomen soft, NT/ND with normal active bowel sounds. Extremities: No C/E/C   Discharge Instructions  Discharge Orders   Future Appointments Provider Department Dept Phone    10/27/2013 2:35 PM [facility] [phone]   10/28/2013 2:35 PM [facility] [phone]   10/31/2013 2:35 PM [facility] [phone]   11/01/2013 2:35 PM [facility] [phone]   11/02/2013 2:35 PM [facility] [phone]   11/03/2013 1:00 PM [facility] [phone]   11/03/2013 2:35 PM [facility] [phone]   11/04/2013 1:15 PM Dava Najjar Idelle Jo [facility] [phone]   11/04/2013 1:45 PM Rana Snare, NP [facility] [phone]   11/04/2013 2:35 PM [facility] [place] [facility] [phone]   11/07/2013 12:15 PM [facility] [place] [facility] [phone]   11/07/2013 2:35 PM [facility] [place] [facility] [phone]   11/08/2013 10:25 AM Romie Levee, MD [facility], [place] [phone]   11/08/2013 2:35 PM [facility] [place] [facility] [phone]   11/09/2013 2:35 PM [facility] [place] [facility] [phone]   11/10/2013 2:35 PM [facility] [place] [facility] [phone]   11/11/2013 2:00 PM [facility] Flush Nurse [facility] [facility] [phone]   11/11/2013 2:35 PM [facility] [place] [facility] [phone]   11/14/2013 2:35 PM [facility] [place] [facility] [phone]   11/15/2013 2:35 PM [facility] [place] [facility]  ONCOLOG[facility] [phone]   11/16/2013 2:35 PM [facility] [place] [facility] [phone]   11/21/2013 2:35 PM [facility] [place] [facility] [phone]   11/22/2013 2:35 PM [facility] [place] [facility] [phone]   11/23/2013 2:35 PM [facility] [place] [facility] [phone]   11/24/2013 2:35 PM [facility] [place] [facility] [phone]   Future Orders Complete By Expires   Call MD for:  difficulty breathing, headache or visual disturbances  As directed    Call MD for:  extreme fatigue  As directed    Call MD for:  persistant nausea and vomiting  As directed    Call MD for:  severe uncontrolled pain  As directed    Call MD for:  temperature >100.4  As directed    Diet general  As directed    Discharge instructions  As directed    Comments:     You were cared for by Dr. Hillery Aldo  (a hospitalist) during your hospital stay. If you have any questions about your discharge medications or the care you received while you were in the hospital after you are discharged, you can call the unit and ask to speak with the hospitalist on call if the hospitalist that took care of you is not available. Once you are discharged, your primary care physician will handle any further medical issues. Please note that NO REFILLS for any discharge medications will be authorized once you are discharged, as it is imperative that you return to your  primary care physician (or establish a relationship with a primary care physician if you do not have one) for your aftercare needs so that they can reassess your need for medications and monitor your lab values.  Any outstanding tests can be reviewed by your PCP at your follow up visit.  It is also important to review any medicine changes with your PCP.  Please bring these d/c instructions with you to your next  visit so your physician can review these changes with you.  If you do not have a primary care physician, you can call [phone] for a physician referral.  It is highly recommended that you obtain a PCP for hospital follow up.   Face-to-face encounter (required for Medicare/Medicaid patients)  As directed    Comments:     I RAMA,CHRISTINA certify that this patient is under my care and that I, or a nurse practitioner or physician's assistant working with me, had a face-to-face encounter that meets the physician face-to-face encounter requirements with this patient on 10/27/2013. The encounter with the patient was in whole, or in part for the following medical condition(s) which is the primary reason for home health care (List medical condition): Deconditioning after chemo for rectal cancer.  Needs HH aide for assistance with ADLS.   Questions:     The encounter with the patient was in whole, or in part, for the following medical condition, which is the primary reason for home health care:  Rectal cancer, deconditioning status post chemo   I certify that, based on my findings, the following services are medically necessary home health services:  Nursing   Physical therapy   My clinical findings support the need for the above services:  Bedbound   Further, I certify that my clinical findings support that this patient is homebound due to:  Unable to leave home safely without assistance   Reason for Medically Necessary Home Health Services:  Therapy- Therapeutic Exercises to Increase Strength and Endurance   Home Health  As directed    Questions:     To provide the following care/treatments:  Home Health Aide   Increase activity slowly  As directed        Medication List         ALPRAZolam 0.25 MG tablet  Commonly known as:  XANAX  Take 1 tablet (0.25 mg total) by mouth 3 (three) times daily.     estrogens (conjugated) 0.625 MG tablet  Commonly known as:  PREMARIN  Take 0.625 mg by mouth daily.      FLUoxetine 40 MG capsule  Commonly known as:  PROZAC  Take 40 mg by mouth every morning.     HYDROcodone-acetaminophen 5-325 MG per tablet  Commonly known as:  NORCO/VICODIN  Take 1 tablet by mouth every 4 (four) hours as needed for pain.     levofloxacin 500 MG tablet  Commonly known as:  LEVAQUIN  Take 1 tablet (500 mg total) by mouth daily.     levothyroxine 100 MCG tablet  Commonly known as:  SYNTHROID, LEVOTHROID  Take 100 mcg by mouth every morning.     lidocaine 5 % ointment  Commonly known as:  XYLOCAINE  Apply topically as needed.     loperamide 2 MG capsule  Commonly known as:  IMODIUM  Take 1-2 capsules (2-4 mg total) by mouth as needed for diarrhea or loose stools (give after pt has loose stool; at most pt can have it 8 times each day).  magic mouthwash w/lidocaine Soln  Take 10 mLs by mouth 4 (four) times daily as needed for mouth pain.     multivitamin with minerals Tabs tablet  Take 1 tablet by mouth daily.     ondansetron 8 MG tablet  Commonly known as:  ZOFRAN  Take 1 tablet (8 mg total) by mouth every 8 (eight) hours as needed for nausea.     phosphorus 155-852-130 MG tablet  Commonly known as:  K PHOS NEUTRAL  Take 1 tablet (250 mg total) by mouth 3 (three) times daily.     potassium chloride SA 20 MEQ tablet  Commonly known as:  K-DUR,KLOR-CON  Take 1 tablet (20 mEq total) by mouth daily.     traMADol 50 MG tablet  Commonly known as:  ULTRAM  Take 1 tablet (50 mg total) by mouth every 6 (six) hours as needed for pain.     trolamine salicylate 10 % cream  Commonly known as:  ASPERCREME  Apply 1 application topically as needed (pain).     zolpidem 5 MG tablet  Commonly known as:  AMBIEN  Take 1 tablet (5 mg total) by mouth at bedtime as needed for sleep.          The results of significant diagnostics from this hospitalization (including imaging, microbiology, ancillary and laboratory) are listed below for reference.     Significant Diagnostic Studies: Dg Chest 2 View  10/23/2013   CLINICAL DATA:  Fever, weakness, hypertension.  EXAM: CHEST  2 VIEW  COMPARISON:  10/17/2012  FINDINGS: The heart size is normal. There is mild perihilar bronchial thickening. No focal consolidations or pleural effusions. No pulmonary edema.Surgical clips are identified in the upper abdomen. There is mild thoracic degenerative change.  IMPRESSION: 1. Bronchitic changes. 2.  No evidence for acute cardiopulmonary abnormality.   Electronically Signed   By: Rosalie Gums M.D.   On: 10/23/2013 09:33   Ir Fluoro Guide Cv Line Right  10/10/2013   CLINICAL DATA:  Patient with anal carcinoma, to receive chemotherapy.  EXAM: Powered PICC LINE PLACEMENT WITH ULTRASOUND AND FLUOROSCOPIC GUIDANCE  FLUOROSCOPY TIME:  0 min, 12 seconds  PROCEDURE: The patient was advised of the possible risks and complications and agreed to undergo the procedure. The patient was then brought to the angiographic suite for the procedure.  The right arm was prepped with chlorhexidine, draped in the usual sterile fashion using maximum barrier technique (cap and mask, sterile gown, sterile gloves, large sterile sheet, hand hygiene and cutaneous antisepsis) and infiltrated locally with 1% Lidocaine.  Ultrasound demonstrated patency of the right basilic vein, and this was documented with an image. Under real-time ultrasound guidance, this vein was accessed with a 21 gauge micropuncture needle and image documentation was performed. A 0.018 wire was introduced in to the vein. Over this, a 5 [place] dual lumen power injectable PICC, cut to a length of 40cm, was advanced to the lower SVC/right atrial junction. Fluoroscopy during the procedure and fluoro spot radiograph confirms appropriate catheter position. The catheter was flushed and covered with a sterile dressing.  Complications: None  IMPRESSION: Successful right arm power PICC line placement with ultrasound and fluoroscopic guidance.  The catheter is ready for use.  Read by Brayton El PA-C   Electronically Signed   By: Richarda Overlie M.D.   On: 10/10/2013 12:33   Ir US Guide Vasc Access Right  10/12/2013   CLINICAL DATA:  Patient with anal carcinoma, to receive chemotherapy.  EXAM: Powered PICC LINE  PLACEMENT WITH ULTRASOUND AND FLUOROSCOPIC GUIDANCE  FLUOROSCOPY TIME:  0 min, 12 seconds  PROCEDURE: The patient was advised of the possible risks and complications and agreed to undergo the procedure. The patient was then brought to the angiographic suite for the procedure.  The right arm was prepped with chlorhexidine, draped in the usual sterile fashion using maximum barrier technique (cap and mask, sterile gown, sterile gloves, large sterile sheet, hand hygiene and cutaneous antisepsis) and infiltrated locally with 1% Lidocaine.  Ultrasound demonstrated patency of the right basilic vein, and this was documented with an image. Under real-time ultrasound guidance, this vein was accessed with a 21 gauge micropuncture needle and image documentation was performed. A 0.018 wire was introduced in to the vein. Over this, a 5 [place] dual lumen power injectable PICC, cut to a length of 40cm, was advanced to the lower SVC/right atrial junction. Fluoroscopy during the procedure and fluoro spot radiograph confirms appropriate catheter position. The catheter was flushed and covered with a sterile dressing.  Complications: None  IMPRESSION: Successful right arm power PICC line placement with ultrasound and fluoroscopic guidance. The catheter is ready for use.  Read by Brayton El PA-C   Electronically Signed   By: Richarda Overlie M.D.   On: 10/12/2013 17:16    Labs:  Basic Metabolic Panel:  Recent Labs Lab 10/23/13 0816 10/24/13 0410 10/25/13 0333 10/26/13 0336 10/27/13 0545  NA 126* 128* 132* 131* 133*  K 2.7* 3.3* 3.6 3.2* 3.4*  CL 91* 98 102 98 101  CO2 24 22 22 24 22   GLUCOSE 140* 91 92 98 100*  BUN 5* 6 5* 5* 4*  CREATININE 0.57 0.65  0.59 0.65 0.56  CALCIUM 8.7 8.4 8.5 8.6 8.3*  MG  --  1.4* 1.6  --  1.5  PHOS  --   --   --  2.2*  --    GFR Estimated Creatinine Clearance: 49.5 ml/min (by C-G formula based on Cr of 0.56). Liver Function Tests:  Recent Labs Lab 10/23/13 0816  AST 20  ALT 21  ALKPHOS 53  BILITOT 0.4  PROT 6.5  ALBUMIN 2.7*   CBC:  Recent Labs Lab 10/21/13 1324 10/23/13 0816 10/24/13 0410 10/25/13 0333 10/26/13 0336 10/27/13 0545  WBC 0.3* 0.1* 0.9* 4.4 5.0 2.3*  NEUTROABS 0.1* 0.0* 0.5* 3.8  --   --   HGB 10.1* 9.9* 9.0* 8.9* 8.6* 8.4*  HCT 28.7* 27.6* 25.2* 25.0* 24.5* 24.0*  MCV 87.0 86.3 86.6 86.8 86.6 86.0  PLT 37* 13* 10* 13* 25* 39*   Microbiology Recent Results (from the past 240 hour(s))  CULTURE, BLOOD (ROUTINE X 2)     Status: None   Collection Time    10/23/13  8:10 AM      Result Value Range Status   Specimen Description BLOOD RIGHT ARM   Final   Special Requests BOTTLES DRAWN AEROBIC AND ANAEROBIC 5CC EACH   Final   Culture  Setup Time     Final   Value: 10/23/2013 20:57     Performed at [facility]   Culture     Final   Value:        BLOOD CULTURE RECEIVED NO GROWTH TO DATE CULTURE WILL BE HELD FOR 5 DAYS BEFORE ISSUING A FINAL NEGATIVE REPORT     Performed at [facility]   Report Status PENDING   Incomplete  CULTURE, BLOOD (ROUTINE X 2)     Status: None   Collection Time  10/23/13  8:40 AM      Result Value Range Status   Specimen Description BLOOD RIGHT ANTECUBITAL   Final   Special Requests BOTTLES DRAWN AEROBIC AND ANAEROBIC 3 CC EA   Final   Culture  Setup Time     Final   Value: 10/23/2013 20:57     Performed at [facility]   Culture     Final   Value:        BLOOD CULTURE RECEIVED NO GROWTH TO DATE CULTURE WILL BE HELD FOR 5 DAYS BEFORE ISSUING A FINAL NEGATIVE REPORT     Performed at [facility]   Report Status PENDING   Incomplete  URINE CULTURE     Status: None   Collection Time    10/23/13  8:59 AM       Result Value Range Status   Specimen Description URINE, CLEAN CATCH   Final   Special Requests levaquin Immunocompromised   Final   Culture  Setup Time     Final   Value: 10/24/2013 10:05     Performed at [facility] Count     Final   Value: NO GROWTH     Performed at [facility]   Culture     Final   Value: NO GROWTH     Performed at [facility]   Report Status 10/25/2013 FINAL   Final  CLOSTRIDIUM DIFFICILE BY PCR     Status: None   Collection Time    10/23/13  7:06 PM      Result Value Range Status   C difficile by pcr NEGATIVE  NEGATIVE Final   Comment: Performed at [facility]    Time coordinating discharge: 35 minutes.  Signed:  RAMA,CHRISTINA  Pager [phone] [facility] 10/27/2013, 10:02 AM

## 2013-10-27 NOTE — Progress Notes (Signed)
IP PROGRESS NOTE  Subjective:   The mouth feels better. Less diarrhea, the stool is partially formed.  Objective: Vital signs in last 24 hours: Blood pressure 147/64, pulse 78, temperature 98.2 F (36.8 C), temperature source Oral, resp. rate 16, height 5' 4.96" (1.65 m), weight 146 lb 9.7 oz (66.5 kg), SpO2 97.00%.  Intake/Output from previous day: 11/05 0701 - 11/06 0700 In: 7532 [P.O.:930; I.V.:6602] Out: 750 [Urine:750]  Physical Exam:  HEENT: The mucositis appears much improved. No thrush. Lungs: Clear bilaterally Cardiac: Regular rate and rhythm Abdomen: No hepatosplenomegaly Extremities: No leg edema Skin: Resolving rash of the neck and dorsum of the hands   Lab Results:  Recent Labs  10/26/13 0336 10/27/13 0545  WBC 5.0 2.3*  HGB 8.6* 8.4*  HCT 24.5* 24.0*  PLT 25* 39*   ANC 3.8 on 10/25/2013  BMET  Recent Labs  10/26/13 0336 10/27/13 0545  NA 131* 133*  K 3.2* 3.4*  CL 98 101  CO2 24 22  GLUCOSE 98 100*  BUN 5* 4*  CREATININE 0.65 0.56  CALCIUM 8.6 8.3*    Studies/Results: No results found.  Medications: I have reviewed the patient's current medications.  Assessment/Plan:  1. Squamous cell carcinoma of the anus. Staging PET scan 09/23/2013 of a hypermetabolic anal mass and hypermetabolic right inguinal/perineal nodes. Radiation and cycle 1 5-FU/mitomycin C. beginning 10/10/2013. 2. Question palpable inguinal/pubic lymphadenopathy. The suprapubic palpable lesions appeared hypermetabolic on the PET scan. 3. Rectal pain and bleeding secondary to #1. 4. Nausea predating chemotherapy.  5. Thickening/hypermetabolism at the splenic flexure. Question second primary. 6. Mucositis secondary to chemotherapy. Improved. 7. Skin rash likely related to 5-fluorouracil. Improved. 8. Pancytopenia secondary to chemotherapy, the neutrophil count has improved with Neupogen. The platelet count is recovering  9. Diarrhea-likely secondary to chemotherapy induced  enteritis , C. Difficile negative 10/23/2013. Improved 10. Hypokalemia secondary to diarrhea 11. Fever in the setting of neutropenia-maintained on broad-spectrum antibiotics, cultures negative to date   Her performance status has improved and the diarrhea has slowed. She appears stable for discharge from an oncology standpoint  1. continue radiation 2. complete outpatient course of Levaquin 3. followup in medical oncology as scheduled    LOS: 4 days   Marti Acebo  10/27/2013, 1:55 PM

## 2013-10-27 NOTE — Progress Notes (Signed)
Patient received discharge instructions with son at bedside. Both verbalized understanding of follow up appointments, prescriptions, and when to call the physician. Patient belongings packed. Patient to be taken downstairs via wheelchair to be discharged home.

## 2013-10-28 ENCOUNTER — Ambulatory Visit
Admission: RE | Admit: 2013-10-28 | Discharge: 2013-10-28 | Disposition: A | Discharge status: Home / Self Care | Payer: Medicare Other | Source: Ambulatory Visit | Attending: Radiation Oncology | Admitting: Radiation Oncology

## 2013-10-28 ENCOUNTER — Telehealth: Payer: Self-pay | Admitting: Oncology

## 2013-10-28 ENCOUNTER — Other Ambulatory Visit: Payer: Self-pay | Admitting: *Deleted

## 2013-10-28 DIAGNOSIS — C210 Malignant neoplasm of anus, unspecified: Secondary | ICD-10-CM | POA: Diagnosis not present

## 2013-10-28 DIAGNOSIS — R5081 Fever presenting with conditions classified elsewhere: Secondary | ICD-10-CM | POA: Diagnosis not present

## 2013-10-28 DIAGNOSIS — R110 Nausea: Secondary | ICD-10-CM | POA: Diagnosis not present

## 2013-10-28 DIAGNOSIS — K121 Other forms of stomatitis: Secondary | ICD-10-CM | POA: Diagnosis not present

## 2013-10-28 DIAGNOSIS — Z79899 Other long term (current) drug therapy: Secondary | ICD-10-CM | POA: Diagnosis not present

## 2013-10-28 DIAGNOSIS — R197 Diarrhea, unspecified: Secondary | ICD-10-CM | POA: Diagnosis not present

## 2013-10-28 DIAGNOSIS — D709 Neutropenia, unspecified: Secondary | ICD-10-CM | POA: Diagnosis not present

## 2013-10-28 DIAGNOSIS — Z510 Encounter for antineoplastic radiation therapy: Secondary | ICD-10-CM | POA: Diagnosis not present

## 2013-10-28 DIAGNOSIS — D696 Thrombocytopenia, unspecified: Secondary | ICD-10-CM | POA: Diagnosis not present

## 2013-10-28 NOTE — Telephone Encounter (Signed)
Called pt and left message and for lab draw on 11/10

## 2013-10-29 DIAGNOSIS — E46 Unspecified protein-calorie malnutrition: Secondary | ICD-10-CM | POA: Diagnosis not present

## 2013-10-29 DIAGNOSIS — D708 Other neutropenia: Secondary | ICD-10-CM | POA: Diagnosis not present

## 2013-10-29 DIAGNOSIS — Z5111 Encounter for antineoplastic chemotherapy: Secondary | ICD-10-CM | POA: Diagnosis not present

## 2013-10-29 DIAGNOSIS — K1231 Oral mucositis (ulcerative) due to antineoplastic therapy: Secondary | ICD-10-CM | POA: Diagnosis not present

## 2013-10-29 DIAGNOSIS — C210 Malignant neoplasm of anus, unspecified: Secondary | ICD-10-CM | POA: Diagnosis not present

## 2013-10-29 LAB — CULTURE, BLOOD (ROUTINE X 2): Culture: NO GROWTH

## 2013-10-31 ENCOUNTER — Other Ambulatory Visit (HOSPITAL_BASED_OUTPATIENT_CLINIC_OR_DEPARTMENT_OTHER): Payer: Medicare Other | Admitting: Lab

## 2013-10-31 ENCOUNTER — Ambulatory Visit
Admission: RE | Admit: 2013-10-31 | Discharge: 2013-10-31 | Disposition: A | Discharge status: Home / Self Care | Payer: Medicare Other | Source: Ambulatory Visit | Attending: Radiation Oncology | Admitting: Radiation Oncology

## 2013-10-31 DIAGNOSIS — E46 Unspecified protein-calorie malnutrition: Secondary | ICD-10-CM | POA: Diagnosis not present

## 2013-10-31 DIAGNOSIS — D709 Neutropenia, unspecified: Secondary | ICD-10-CM | POA: Diagnosis not present

## 2013-10-31 DIAGNOSIS — Z5111 Encounter for antineoplastic chemotherapy: Secondary | ICD-10-CM | POA: Diagnosis not present

## 2013-10-31 DIAGNOSIS — C210 Malignant neoplasm of anus, unspecified: Secondary | ICD-10-CM

## 2013-10-31 DIAGNOSIS — Z510 Encounter for antineoplastic radiation therapy: Secondary | ICD-10-CM | POA: Diagnosis not present

## 2013-10-31 DIAGNOSIS — R5081 Fever presenting with conditions classified elsewhere: Secondary | ICD-10-CM | POA: Diagnosis not present

## 2013-10-31 DIAGNOSIS — K1231 Oral mucositis (ulcerative) due to antineoplastic therapy: Secondary | ICD-10-CM | POA: Diagnosis not present

## 2013-10-31 DIAGNOSIS — D708 Other neutropenia: Secondary | ICD-10-CM | POA: Diagnosis not present

## 2013-10-31 DIAGNOSIS — D696 Thrombocytopenia, unspecified: Secondary | ICD-10-CM | POA: Diagnosis not present

## 2013-10-31 DIAGNOSIS — K121 Other forms of stomatitis: Secondary | ICD-10-CM | POA: Diagnosis not present

## 2013-10-31 LAB — CBC WITH DIFFERENTIAL/PLATELET
BASO%: 0.4 % (ref 0.0–2.0)
Basophils Absolute: 0 10*3/uL (ref 0.0–0.1)
EOS%: 0.8 % (ref 0.0–7.0)
Eosinophils Absolute: 0 10*3/uL (ref 0.0–0.5)
HGB: 8.6 g/dL — ABNORMAL LOW (ref 11.6–15.9)
LYMPH%: 9.2 % — ABNORMAL LOW (ref 14.0–49.7)
MCH: 31 pg (ref 25.1–34.0)
MCHC: 34.8 g/dL (ref 31.5–36.0)
MCV: 89 fL (ref 79.5–101.0)
MONO%: 12.9 % (ref 0.0–14.0)
Platelets: 181 10*3/uL (ref 145–400)
RBC: 2.77 10*6/uL — ABNORMAL LOW (ref 3.70–5.45)
RDW: 12.2 % (ref 11.2–14.5)

## 2013-10-31 LAB — BASIC METABOLIC PANEL (CC13)
Anion Gap: 10 mEq/L (ref 3–11)
BUN: 7.8 mg/dL (ref 7.0–26.0)
Calcium: 8.7 mg/dL (ref 8.4–10.4)
Glucose: 103 mg/dl (ref 70–140)
Potassium: 3.1 mEq/L — ABNORMAL LOW (ref 3.5–5.1)

## 2013-10-31 LAB — MAGNESIUM (CC13): Magnesium: 1.6 mg/dl (ref 1.5–2.5)

## 2013-11-01 ENCOUNTER — Ambulatory Visit
Admission: RE | Admit: 2013-11-01 | Discharge: 2013-11-01 | Disposition: A | Discharge status: Home / Self Care | Payer: Medicare Other | Source: Ambulatory Visit | Attending: Radiation Oncology | Admitting: Radiation Oncology

## 2013-11-01 DIAGNOSIS — C210 Malignant neoplasm of anus, unspecified: Secondary | ICD-10-CM | POA: Diagnosis not present

## 2013-11-01 DIAGNOSIS — D709 Neutropenia, unspecified: Secondary | ICD-10-CM | POA: Diagnosis not present

## 2013-11-01 DIAGNOSIS — R5081 Fever presenting with conditions classified elsewhere: Secondary | ICD-10-CM | POA: Diagnosis not present

## 2013-11-01 DIAGNOSIS — E46 Unspecified protein-calorie malnutrition: Secondary | ICD-10-CM | POA: Diagnosis not present

## 2013-11-01 DIAGNOSIS — D708 Other neutropenia: Secondary | ICD-10-CM | POA: Diagnosis not present

## 2013-11-01 DIAGNOSIS — K1231 Oral mucositis (ulcerative) due to antineoplastic therapy: Secondary | ICD-10-CM | POA: Diagnosis not present

## 2013-11-01 DIAGNOSIS — Z510 Encounter for antineoplastic radiation therapy: Secondary | ICD-10-CM | POA: Diagnosis not present

## 2013-11-01 DIAGNOSIS — K121 Other forms of stomatitis: Secondary | ICD-10-CM | POA: Diagnosis not present

## 2013-11-01 DIAGNOSIS — Z5111 Encounter for antineoplastic chemotherapy: Secondary | ICD-10-CM | POA: Diagnosis not present

## 2013-11-01 DIAGNOSIS — D696 Thrombocytopenia, unspecified: Secondary | ICD-10-CM | POA: Diagnosis not present

## 2013-11-02 ENCOUNTER — Ambulatory Visit
Admission: RE | Admit: 2013-11-02 | Discharge: 2013-11-02 | Disposition: A | Discharge status: Home / Self Care | Payer: Medicare Other | Source: Ambulatory Visit | Attending: Radiation Oncology | Admitting: Radiation Oncology

## 2013-11-02 ENCOUNTER — Telehealth: Payer: Self-pay | Admitting: *Deleted

## 2013-11-02 DIAGNOSIS — Z510 Encounter for antineoplastic radiation therapy: Secondary | ICD-10-CM | POA: Diagnosis not present

## 2013-11-02 DIAGNOSIS — D708 Other neutropenia: Secondary | ICD-10-CM | POA: Diagnosis not present

## 2013-11-02 DIAGNOSIS — E46 Unspecified protein-calorie malnutrition: Secondary | ICD-10-CM | POA: Diagnosis not present

## 2013-11-02 DIAGNOSIS — C210 Malignant neoplasm of anus, unspecified: Secondary | ICD-10-CM | POA: Diagnosis not present

## 2013-11-02 DIAGNOSIS — K1231 Oral mucositis (ulcerative) due to antineoplastic therapy: Secondary | ICD-10-CM | POA: Diagnosis not present

## 2013-11-02 DIAGNOSIS — Z5111 Encounter for antineoplastic chemotherapy: Secondary | ICD-10-CM | POA: Diagnosis not present

## 2013-11-02 DIAGNOSIS — D696 Thrombocytopenia, unspecified: Secondary | ICD-10-CM | POA: Diagnosis not present

## 2013-11-02 DIAGNOSIS — R5081 Fever presenting with conditions classified elsewhere: Secondary | ICD-10-CM | POA: Diagnosis not present

## 2013-11-02 DIAGNOSIS — D709 Neutropenia, unspecified: Secondary | ICD-10-CM | POA: Diagnosis not present

## 2013-11-02 DIAGNOSIS — K121 Other forms of stomatitis: Secondary | ICD-10-CM | POA: Diagnosis not present

## 2013-11-02 NOTE — Telephone Encounter (Signed)
Patient wants to cancel PICC for tomorrow. Radiation treatments have been delayed and this week is too soon for placement now. Called interventional radiology and requested to cancel. Will call to reschedule when appropriate.

## 2013-11-03 ENCOUNTER — Ambulatory Visit
Admission: RE | Admit: 2013-11-03 | Discharge: 2013-11-03 | Disposition: A | Discharge status: Home / Self Care | Payer: Medicare Other | Source: Ambulatory Visit | Attending: Radiation Oncology | Admitting: Radiation Oncology

## 2013-11-03 ENCOUNTER — Other Ambulatory Visit (HOSPITAL_COMMUNITY): Payer: Medicare Other

## 2013-11-03 ENCOUNTER — Other Ambulatory Visit: Payer: Self-pay | Admitting: *Deleted

## 2013-11-03 ENCOUNTER — Telehealth: Payer: Self-pay | Admitting: *Deleted

## 2013-11-03 DIAGNOSIS — D696 Thrombocytopenia, unspecified: Secondary | ICD-10-CM | POA: Diagnosis not present

## 2013-11-03 DIAGNOSIS — C210 Malignant neoplasm of anus, unspecified: Secondary | ICD-10-CM | POA: Diagnosis not present

## 2013-11-03 DIAGNOSIS — R5081 Fever presenting with conditions classified elsewhere: Secondary | ICD-10-CM | POA: Diagnosis not present

## 2013-11-03 DIAGNOSIS — Z510 Encounter for antineoplastic radiation therapy: Secondary | ICD-10-CM | POA: Diagnosis not present

## 2013-11-03 DIAGNOSIS — D709 Neutropenia, unspecified: Secondary | ICD-10-CM | POA: Diagnosis not present

## 2013-11-03 DIAGNOSIS — K121 Other forms of stomatitis: Secondary | ICD-10-CM | POA: Diagnosis not present

## 2013-11-03 MED ORDER — POTASSIUM CHLORIDE CRYS ER 20 MEQ PO TBCR: 20.0000 meq | EXTENDED_RELEASE_TABLET | Freq: Two times a day (BID) | ORAL | Status: DC

## 2013-11-03 NOTE — Telephone Encounter (Signed)
Per MD, notified pt to increase her KCL to twice a day.  Pt verbalized understanding and confirmed she will be here tomorrow 11/04/13 @ 1:15pm for lab/MD appt.

## 2013-11-04 ENCOUNTER — Other Ambulatory Visit: Payer: Medicare Other | Admitting: Lab

## 2013-11-04 ENCOUNTER — Ambulatory Visit
Admission: RE | Admit: 2013-11-04 | Discharge: 2013-11-04 | Disposition: A | Discharge status: Home / Self Care | Payer: Medicare Other | Source: Ambulatory Visit | Attending: Radiation Oncology | Admitting: Radiation Oncology

## 2013-11-04 ENCOUNTER — Ambulatory Visit (HOSPITAL_BASED_OUTPATIENT_CLINIC_OR_DEPARTMENT_OTHER): Payer: Medicare Other | Admitting: Nurse Practitioner

## 2013-11-04 ENCOUNTER — Encounter: Payer: Self-pay | Admitting: Radiation Oncology

## 2013-11-04 ENCOUNTER — Other Ambulatory Visit (HOSPITAL_BASED_OUTPATIENT_CLINIC_OR_DEPARTMENT_OTHER): Payer: Medicare Other | Admitting: Lab

## 2013-11-04 VITALS — BP 163/79 | HR 66 | Temp 97.6°F | Resp 20 | Wt 143.1 lb

## 2013-11-04 VITALS — BP 128/70 | HR 73 | Temp 97.7°F | Resp 20 | Ht 64.9 in | Wt 145.6 lb

## 2013-11-04 DIAGNOSIS — E46 Unspecified protein-calorie malnutrition: Secondary | ICD-10-CM | POA: Diagnosis not present

## 2013-11-04 DIAGNOSIS — D708 Other neutropenia: Secondary | ICD-10-CM | POA: Diagnosis not present

## 2013-11-04 DIAGNOSIS — K121 Other forms of stomatitis: Secondary | ICD-10-CM | POA: Diagnosis not present

## 2013-11-04 DIAGNOSIS — R5081 Fever presenting with conditions classified elsewhere: Secondary | ICD-10-CM | POA: Diagnosis not present

## 2013-11-04 DIAGNOSIS — D696 Thrombocytopenia, unspecified: Secondary | ICD-10-CM | POA: Diagnosis not present

## 2013-11-04 DIAGNOSIS — K1231 Oral mucositis (ulcerative) due to antineoplastic therapy: Secondary | ICD-10-CM | POA: Diagnosis not present

## 2013-11-04 DIAGNOSIS — C210 Malignant neoplasm of anus, unspecified: Secondary | ICD-10-CM

## 2013-11-04 DIAGNOSIS — Z5111 Encounter for antineoplastic chemotherapy: Secondary | ICD-10-CM | POA: Diagnosis not present

## 2013-11-04 DIAGNOSIS — Z510 Encounter for antineoplastic radiation therapy: Secondary | ICD-10-CM | POA: Diagnosis not present

## 2013-11-04 DIAGNOSIS — D709 Neutropenia, unspecified: Secondary | ICD-10-CM | POA: Diagnosis not present

## 2013-11-04 DIAGNOSIS — R197 Diarrhea, unspecified: Secondary | ICD-10-CM

## 2013-11-04 LAB — CBC WITH DIFFERENTIAL/PLATELET
BASO%: 0.3 % (ref 0.0–2.0)
Basophils Absolute: 0 10*3/uL (ref 0.0–0.1)
EOS%: 6.8 % (ref 0.0–7.0)
HCT: 25.2 % — ABNORMAL LOW (ref 34.8–46.6)
HGB: 8.3 g/dL — ABNORMAL LOW (ref 11.6–15.9)
MCH: 29.8 pg (ref 25.1–34.0)
MCHC: 32.9 g/dL (ref 31.5–36.0)
MCV: 90.6 fL (ref 79.5–101.0)
MONO%: 18.6 % — ABNORMAL HIGH (ref 0.0–14.0)
RBC: 2.78 10*6/uL — ABNORMAL LOW (ref 3.70–5.45)
RDW: 12.3 % (ref 11.2–14.5)
lymph#: 0.2 10*3/uL — ABNORMAL LOW (ref 0.9–3.3)

## 2013-11-04 LAB — COMPREHENSIVE METABOLIC PANEL (CC13)
ALT: 29 U/L (ref 0–55)
AST: 29 U/L (ref 5–34)
Albumin: 2.9 g/dL — ABNORMAL LOW (ref 3.5–5.0)
Alkaline Phosphatase: 60 U/L (ref 40–150)
BUN: 6.8 mg/dL — ABNORMAL LOW (ref 7.0–26.0)
Calcium: 9.2 mg/dL (ref 8.4–10.4)
Chloride: 102 mEq/L (ref 98–109)
Potassium: 3.8 mEq/L (ref 3.5–5.1)
Sodium: 135 mEq/L — ABNORMAL LOW (ref 136–145)
Total Protein: 6.3 g/dL — ABNORMAL LOW (ref 6.4–8.3)

## 2013-11-04 NOTE — Progress Notes (Addendum)
Pt denies pain, has some loss of appetite and fatigue. She denies urinary issues, reports soft to watery stools after each meal. She states she is taking Imodium once a day. Advised pt increase Imodium to twice a day to try and alleviate her diarrhea. Discussed the problems associated with diarrhea, re: fatigue, nausea, weakness, loss of nutrition, electrolytes. Pt's daughter states pt was recently in hospital due to diarrhea and related problems. Pt agreed to take Imodium twice daily this weekend. Informed her will give this information to [employer] who will fu with her next Mon or Tues for update on her bowels. Pt verbalized agreement and understanding. Sent in basket note to [employer] re: above discussion.

## 2013-11-04 NOTE — Progress Notes (Addendum)
OFFICE PROGRESS NOTE  Interval history:  [name] returns for followup of anal cancer. She was hospitalized 10/23/2013 through 10/27/2013 with febrile neutropenia. White count on admission was 0.1. Platelet count was decreased at 13,000. Cultures were obtained and she was started on IV antibiotics. She received Neupogen with improvement in the neutrophil count. Blood cultures and a urine culture remained negative. Blood counts were improved at time of discharge 10/27/2013 with a total white count of 2.3 and a platelet count of 39,000. She had diarrhea during the hospitalization. Stool was negative for C. difficile by PCR.  She is seen today for scheduled followup. She feels she is improving. Mouth sores have resolved. She continues to have small volume loose stools typically occurring after a meal. She denies rectal bleeding. Skin rash continues to fade. She denies nausea/vomiting.   Objective: Blood pressure 128/70, pulse 73, temperature 97.7 F (36.5 C), temperature source Oral, resp. rate 20, height 5' 4.9" (1.648 m), weight 145 lb 9.6 oz (66.044 kg).  Oropharynx is without thrush or ulceration. Lungs are clear. Regular cardiac rhythm. Abdomen soft and nontender. No hepatomegaly. Extremities without edema. Resolving rash at the neck and dorsum of the hands. Small, less than 1 cm, firm lymph node medial right inguinal region. Mild erythema at the perineum. No skin breakdown.  Lab Results: Lab Results  Component Value Date   WBC 2.3* 11/04/2013   HGB 8.3* 11/04/2013   HCT 25.2* 11/04/2013   MCV 90.6 11/04/2013   PLT 390 11/04/2013  ANC 1.6  Chemistry:    Chemistry      Component Value Date/Time   NA 135* 11/04/2013 1320   NA 133* 10/27/2013 0545   K 3.8 11/04/2013 1320   K 3.4* 10/27/2013 0545   CL 101 10/27/2013 0545   CO2 24 11/04/2013 1320   CO2 22 10/27/2013 0545   BUN 6.8* 11/04/2013 1320   BUN 4* 10/27/2013 0545   CREATININE 0.7 11/04/2013 1320   CREATININE 0.56  10/27/2013 0545   CREATININE 0.78 09/09/2013 1102      Component Value Date/Time   CALCIUM 9.2 11/04/2013 1320   CALCIUM 8.3* 10/27/2013 0545   ALKPHOS 60 11/04/2013 1320   ALKPHOS 53 10/23/2013 0816   AST 29 11/04/2013 1320   AST 20 10/23/2013 0816   ALT 29 11/04/2013 1320   ALT 21 10/23/2013 0816   BILITOT 0.21 11/04/2013 1320   BILITOT 0.4 10/23/2013 0816       Studies/Results: Dg Chest 2 View  10/23/2013   CLINICAL DATA:  Fever, weakness, hypertension.  EXAM: CHEST  2 VIEW  COMPARISON:  10/17/2012  FINDINGS: The heart size is normal. There is mild perihilar bronchial thickening. No focal consolidations or pleural effusions. No pulmonary edema.Surgical clips are identified in the upper abdomen. There is mild thoracic degenerative change.  IMPRESSION: 1. Bronchitic changes. 2.  No evidence for acute cardiopulmonary abnormality.   Electronically Signed   By: Rosalie Gums M.D.   On: 10/23/2013 09:33   Ir Fluoro Guide Cv Line Right  10/10/2013   CLINICAL DATA:  Patient with anal carcinoma, to receive chemotherapy.  EXAM: Powered PICC LINE PLACEMENT WITH ULTRASOUND AND FLUOROSCOPIC GUIDANCE  FLUOROSCOPY TIME:  0 min, 12 seconds  PROCEDURE: The patient was advised of the possible risks and complications and agreed to undergo the procedure. The patient was then brought to the angiographic suite for the procedure.  The right arm was prepped with chlorhexidine, draped in the usual sterile fashion using maximum barrier technique (  cap and mask, sterile gown, sterile gloves, large sterile sheet, hand hygiene and cutaneous antisepsis) and infiltrated locally with 1% Lidocaine.  Ultrasound demonstrated patency of the right basilic vein, and this was documented with an image. Under real-time ultrasound guidance, this vein was accessed with a 21 gauge micropuncture needle and image documentation was performed. A 0.018 wire was introduced in to the vein. Over this, a 5 [place] dual lumen power injectable PICC, cut  to a length of 40cm, was advanced to the lower SVC/right atrial junction. Fluoroscopy during the procedure and fluoro spot radiograph confirms appropriate catheter position. The catheter was flushed and covered with a sterile dressing.  Complications: None  IMPRESSION: Successful right arm power PICC line placement with ultrasound and fluoroscopic guidance. The catheter is ready for use.  Read by Brayton El PA-C   Electronically Signed   By: Richarda Overlie M.D.   On: 10/10/2013 12:33   Ir US Guide Vasc Access Right  10/12/2013   CLINICAL DATA:  Patient with anal carcinoma, to receive chemotherapy.  EXAM: Powered PICC LINE PLACEMENT WITH ULTRASOUND AND FLUOROSCOPIC GUIDANCE  FLUOROSCOPY TIME:  0 min, 12 seconds  PROCEDURE: The patient was advised of the possible risks and complications and agreed to undergo the procedure. The patient was then brought to the angiographic suite for the procedure.  The right arm was prepped with chlorhexidine, draped in the usual sterile fashion using maximum barrier technique (cap and mask, sterile gown, sterile gloves, large sterile sheet, hand hygiene and cutaneous antisepsis) and infiltrated locally with 1% Lidocaine.  Ultrasound demonstrated patency of the right basilic vein, and this was documented with an image. Under real-time ultrasound guidance, this vein was accessed with a 21 gauge micropuncture needle and image documentation was performed. A 0.018 wire was introduced in to the vein. Over this, a 5 [place] dual lumen power injectable PICC, cut to a length of 40cm, was advanced to the lower SVC/right atrial junction. Fluoroscopy during the procedure and fluoro spot radiograph confirms appropriate catheter position. The catheter was flushed and covered with a sterile dressing.  Complications: None  IMPRESSION: Successful right arm power PICC line placement with ultrasound and fluoroscopic guidance. The catheter is ready for use.  Read by Brayton El PA-C   Electronically  Signed   By: Richarda Overlie M.D.   On: 10/12/2013 17:16    Medications: I have reviewed the patient's current medications.  Assessment/Plan:  1. Squamous cell carcinoma of the anus. Staging PET scan 09/23/2013 of a hypermetabolic anal mass and hypermetabolic right inguinal/perineal nodes. Radiation and cycle 1 5-FU/mitomycin C. beginning 10/10/2013. 2. Question palpable inguinal/pubic lymphadenopathy. The suprapubic palpable lesions appeared hypermetabolic on the PET scan. 3. Rectal pain and bleeding secondary to #1. 4. Nausea predating chemotherapy.  5. Thickening/hypermetabolism at the splenic flexure. Question second primary. 6. Mucositis secondary to chemotherapy. Improved. 7. Skin rash likely related to 5-fluorouracil. Improved. 8. Pancytopenia secondary to chemotherapy. The neutrophil count improved with Neupogen. The platelet count has recovered. 9. Diarrhea-likely secondary to chemotherapy induced enteritis , C. Difficile negative 10/23/2013. She continues to have small volume loose stools. 10. Hypokalemia secondary to diarrhea. Potassium is now in normal range. 11. Hospitalization with febrile neutropenia 10/23/2013 through 10/27/2013. Cultures negative.  Disposition-Cindy [name] appears stable. She continues radiation. She is undecided regarding further chemotherapy. She does not feel ready to proceed with cycle 2 as planned beginning 11/07/2013. We tentatively scheduled her for cycle 2 beginning 11/21/2013. She will need to have a PICC line placed  prior to treatment. She will contact the office prior to 11/21/2013 with her decision regarding chemotherapy.  Dr. Truett Perna recommends eliminating mitomycin C. from the regimen and dose reducing the 5-fluorouracil.  We scheduled a return visit on 12/02/2013. She will contact the office in the interim as outlined above or with any problems.  Patient seen with Dr. Truett Perna.  25 minutes were spent face-to-face at today's visit with the  majority of that time involved in counseling/coordination of care.  Lonna Cobb ANP/GNP-BC   This was a shared visit with Lonna Cobb. She continues radiation. I discussed the indication for completing a second cycle of chemotherapy with Cindy [name] and her daughter.They understand standard treatment for anal cancer includes 2 cycles of chemotherapy. She developed mucositis, diarrhea and pancytopenia with cycle 1.I recommend eliminating the mitomycin-C and dose reducing the 5-fluorouracil with cycle 2. She will be scheduled for cycle 2 on 11/21/2013. She will let us know if she decides against a second cycle of chemotherapy.   Mancel Bale, M.D.

## 2013-11-05 NOTE — Progress Notes (Signed)
Department of Radiation Oncology  Phone:  [phone] Fax:        [phone]  Weekly Treatment Note    Name: Cindy [name] Date: 11/05/2013 MRN: [id number] DOB: Jul 29, 1930   Current dose: 32.4 Gy  Current fraction: 18   MEDICATIONS: Current Outpatient Prescriptions  Medication Sig Dispense Refill  . ALPRAZolam (XANAX) 0.25 MG tablet Take 1 tablet (0.25 mg total) by mouth 3 (three) times daily.  60 tablet  0  . Alum & Mag Hydroxide-Simeth (MAGIC MOUTHWASH W/LIDOCAINE) SOLN Take 10 mLs by mouth 4 (four) times daily as needed for mouth pain.  500 mL  2  . estrogens, conjugated, (PREMARIN) 0.625 MG tablet Take 0.625 mg by mouth daily.       Marland Kitchen FLUoxetine (PROZAC) 40 MG capsule Take 40 mg by mouth every morning.       Marland Kitchen HYDROcodone-acetaminophen (NORCO/VICODIN) 5-325 MG per tablet Take 1 tablet by mouth every 4 (four) hours as needed for pain.  60 tablet  0  . levothyroxine (SYNTHROID, LEVOTHROID) 100 MCG tablet Take 100 mcg by mouth every morning.       . lidocaine (XYLOCAINE) 5 % ointment Apply topically as needed.  35.44 g  3  . loperamide (IMODIUM) 2 MG capsule Take 1-2 capsules (2-4 mg total) by mouth as needed for diarrhea or loose stools (give after pt has loose stool; at most pt can have it 8 times each day).  30 capsule  0  . Multiple Vitamin (MULTIVITAMIN WITH MINERALS) TABS tablet Take 1 tablet by mouth daily.  30 tablet  3  . ondansetron (ZOFRAN) 8 MG tablet Take 1 tablet (8 mg total) by mouth every 8 (eight) hours as needed for nausea.  20 tablet  1  . phosphorus (K PHOS NEUTRAL) 155-852-130 MG tablet Take 1 tablet (250 mg total) by mouth 3 (three) times daily.  30 tablet  0  . potassium chloride SA (K-DUR,KLOR-CON) 20 MEQ tablet Take 1 tablet (20 mEq total) by mouth 2 (two) times daily.  30 tablet  1  . traMADol (ULTRAM) 50 MG tablet Take 1 tablet (50 mg total) by mouth every 6 (six) hours as needed for pain.  30 tablet  1  . trolamine salicylate (ASPERCREME) 10 %  cream Apply 1 application topically as needed (pain).      Marland Kitchen zolpidem (AMBIEN) 5 MG tablet Take 1 tablet (5 mg total) by mouth at bedtime as needed for sleep.  30 tablet  0   No current facility-administered medications for this encounter.     ALLERGIES: Morphine and related   LABORATORY DATA:  Lab Results  Component Value Date   WBC 2.3* 11/04/2013   HGB 8.3* 11/04/2013   HCT 25.2* 11/04/2013   MCV 90.6 11/04/2013   PLT 390 11/04/2013   Lab Results  Component Value Date   NA 135* 11/04/2013   K 3.8 11/04/2013   CL 101 10/27/2013   CO2 24 11/04/2013   Lab Results  Component Value Date   ALT 29 11/04/2013   AST 29 11/04/2013   ALKPHOS 60 11/04/2013   BILITOT 0.21 11/04/2013     NARRATIVE: Cindy [name] was seen today for weekly treatment management. The chart was checked and the patient's films were reviewed. The patient states that she is doing relatively well. Feeling better since she got out of the hospital. She has continued to have some diarrhea. Only taking Imodium twice a day. Irritation in the anal/rectal region has been moderate.  PHYSICAL EXAMINATION: weight is 143 lb 1.6 oz (64.91 kg). Her oral temperature is 97.6 F (36.4 C). Her blood pressure is 163/79 and her pulse is 66. Her respiration is 20.        ASSESSMENT: The patient is doing satisfactorily with treatment.  PLAN: We will continue with the patient's radiation treatment as planned. The patient will increase reviews of Imodium as needed, at least 3 times per day.

## 2013-11-07 ENCOUNTER — Ambulatory Visit: Payer: Medicare Other

## 2013-11-07 ENCOUNTER — Other Ambulatory Visit: Payer: Self-pay | Admitting: Radiation Oncology

## 2013-11-07 ENCOUNTER — Other Ambulatory Visit: Payer: Self-pay | Admitting: Internal Medicine

## 2013-11-07 ENCOUNTER — Telehealth: Payer: Self-pay | Admitting: Oncology

## 2013-11-07 ENCOUNTER — Ambulatory Visit
Admission: RE | Admit: 2013-11-07 | Discharge: 2013-11-07 | Disposition: A | Discharge status: Home / Self Care | Payer: Medicare Other | Source: Ambulatory Visit | Attending: Radiation Oncology | Admitting: Radiation Oncology

## 2013-11-07 ENCOUNTER — Telehealth: Payer: Self-pay | Admitting: *Deleted

## 2013-11-07 DIAGNOSIS — D696 Thrombocytopenia, unspecified: Secondary | ICD-10-CM | POA: Diagnosis not present

## 2013-11-07 DIAGNOSIS — K1231 Oral mucositis (ulcerative) due to antineoplastic therapy: Secondary | ICD-10-CM | POA: Diagnosis not present

## 2013-11-07 DIAGNOSIS — Z5111 Encounter for antineoplastic chemotherapy: Secondary | ICD-10-CM | POA: Diagnosis not present

## 2013-11-07 DIAGNOSIS — R5081 Fever presenting with conditions classified elsewhere: Secondary | ICD-10-CM | POA: Diagnosis not present

## 2013-11-07 DIAGNOSIS — C210 Malignant neoplasm of anus, unspecified: Secondary | ICD-10-CM | POA: Diagnosis not present

## 2013-11-07 DIAGNOSIS — Z510 Encounter for antineoplastic radiation therapy: Secondary | ICD-10-CM | POA: Diagnosis not present

## 2013-11-07 DIAGNOSIS — D708 Other neutropenia: Secondary | ICD-10-CM | POA: Diagnosis not present

## 2013-11-07 DIAGNOSIS — D709 Neutropenia, unspecified: Secondary | ICD-10-CM | POA: Diagnosis not present

## 2013-11-07 DIAGNOSIS — K121 Other forms of stomatitis: Secondary | ICD-10-CM | POA: Diagnosis not present

## 2013-11-07 DIAGNOSIS — E46 Unspecified protein-calorie malnutrition: Secondary | ICD-10-CM | POA: Diagnosis not present

## 2013-11-07 NOTE — Telephone Encounter (Signed)
Dgt came by ofc and picked up new schedules for Nov and Dec  as someone had called and instructed her Mom to do shh

## 2013-11-07 NOTE — Telephone Encounter (Signed)
Per staff message and POF I have scheduled appts.  JMW  

## 2013-11-07 NOTE — Progress Notes (Signed)
Met patient at elevator, obtained one of our w/c, upstairs was out, patient still fatigued, but a little better stated, she only takes 1 imodium ad in am and one at night, but still having diarrhea everytime she eats, asked what she was eating"sourqraut and reuben sandwiches ", discussed that she could take up to 8 ina 24 hour period, to take an imodium after each diarrhea up to 8 in a day(24hours), eat low fiber diet, patient said she would try that 2:59 PM

## 2013-11-08 ENCOUNTER — Ambulatory Visit
Admission: RE | Admit: 2013-11-08 | Discharge: 2013-11-08 | Disposition: A | Discharge status: Home / Self Care | Payer: Medicare Other | Source: Ambulatory Visit | Attending: Radiation Oncology | Admitting: Radiation Oncology

## 2013-11-08 ENCOUNTER — Encounter (INDEPENDENT_AMBULATORY_CARE_PROVIDER_SITE_OTHER): Payer: Medicare Other | Admitting: General Surgery

## 2013-11-08 DIAGNOSIS — Z510 Encounter for antineoplastic radiation therapy: Secondary | ICD-10-CM | POA: Diagnosis not present

## 2013-11-08 DIAGNOSIS — C210 Malignant neoplasm of anus, unspecified: Secondary | ICD-10-CM | POA: Diagnosis not present

## 2013-11-08 DIAGNOSIS — D696 Thrombocytopenia, unspecified: Secondary | ICD-10-CM | POA: Diagnosis not present

## 2013-11-08 DIAGNOSIS — K121 Other forms of stomatitis: Secondary | ICD-10-CM | POA: Diagnosis not present

## 2013-11-08 DIAGNOSIS — D709 Neutropenia, unspecified: Secondary | ICD-10-CM | POA: Diagnosis not present

## 2013-11-08 DIAGNOSIS — R5081 Fever presenting with conditions classified elsewhere: Secondary | ICD-10-CM | POA: Diagnosis not present

## 2013-11-09 ENCOUNTER — Telehealth: Payer: Self-pay | Admitting: *Deleted

## 2013-11-09 ENCOUNTER — Ambulatory Visit
Admission: RE | Admit: 2013-11-09 | Discharge: 2013-11-09 | Disposition: A | Discharge status: Home / Self Care | Payer: Medicare Other | Source: Ambulatory Visit | Attending: Radiation Oncology | Admitting: Radiation Oncology

## 2013-11-09 DIAGNOSIS — K1231 Oral mucositis (ulcerative) due to antineoplastic therapy: Secondary | ICD-10-CM | POA: Diagnosis not present

## 2013-11-09 DIAGNOSIS — Z510 Encounter for antineoplastic radiation therapy: Secondary | ICD-10-CM | POA: Diagnosis not present

## 2013-11-09 DIAGNOSIS — C210 Malignant neoplasm of anus, unspecified: Secondary | ICD-10-CM | POA: Diagnosis not present

## 2013-11-09 DIAGNOSIS — Z5111 Encounter for antineoplastic chemotherapy: Secondary | ICD-10-CM | POA: Diagnosis not present

## 2013-11-09 DIAGNOSIS — D709 Neutropenia, unspecified: Secondary | ICD-10-CM | POA: Diagnosis not present

## 2013-11-09 DIAGNOSIS — D708 Other neutropenia: Secondary | ICD-10-CM | POA: Diagnosis not present

## 2013-11-09 DIAGNOSIS — E46 Unspecified protein-calorie malnutrition: Secondary | ICD-10-CM | POA: Diagnosis not present

## 2013-11-09 DIAGNOSIS — K121 Other forms of stomatitis: Secondary | ICD-10-CM | POA: Diagnosis not present

## 2013-11-09 DIAGNOSIS — D696 Thrombocytopenia, unspecified: Secondary | ICD-10-CM | POA: Diagnosis not present

## 2013-11-09 DIAGNOSIS — R5081 Fever presenting with conditions classified elsewhere: Secondary | ICD-10-CM | POA: Diagnosis not present

## 2013-11-09 NOTE — Telephone Encounter (Signed)
Called [facility] [phone] spoke with  Raniya Golembeski,pharmacist, rx for Xanax not in their systema s yet so gave verbal order from written order drom Dr.Moody, : Xanax 0.25mg  tab,take 1 tab 3x day,dispence 6-0 tabs,no refill, thanked pharmacist ,will inform patient when she gets here for rad tx 2:38 PM

## 2013-11-10 ENCOUNTER — Other Ambulatory Visit: Payer: Self-pay | Admitting: Oncology

## 2013-11-10 ENCOUNTER — Other Ambulatory Visit (HOSPITAL_COMMUNITY): Payer: Self-pay | Admitting: Radiation Oncology

## 2013-11-10 ENCOUNTER — Other Ambulatory Visit: Payer: Self-pay | Admitting: *Deleted

## 2013-11-10 ENCOUNTER — Ambulatory Visit
Admission: RE | Admit: 2013-11-10 | Discharge: 2013-11-10 | Disposition: A | Discharge status: Home / Self Care | Payer: Medicare Other | Source: Ambulatory Visit | Attending: Radiation Oncology | Admitting: Radiation Oncology

## 2013-11-10 DIAGNOSIS — Z5111 Encounter for antineoplastic chemotherapy: Secondary | ICD-10-CM | POA: Diagnosis not present

## 2013-11-10 DIAGNOSIS — D708 Other neutropenia: Secondary | ICD-10-CM | POA: Diagnosis not present

## 2013-11-10 DIAGNOSIS — K121 Other forms of stomatitis: Secondary | ICD-10-CM | POA: Diagnosis not present

## 2013-11-10 DIAGNOSIS — D696 Thrombocytopenia, unspecified: Secondary | ICD-10-CM | POA: Diagnosis not present

## 2013-11-10 DIAGNOSIS — D709 Neutropenia, unspecified: Secondary | ICD-10-CM | POA: Diagnosis not present

## 2013-11-10 DIAGNOSIS — C210 Malignant neoplasm of anus, unspecified: Secondary | ICD-10-CM | POA: Diagnosis not present

## 2013-11-10 DIAGNOSIS — Z510 Encounter for antineoplastic radiation therapy: Secondary | ICD-10-CM | POA: Diagnosis not present

## 2013-11-10 DIAGNOSIS — R5081 Fever presenting with conditions classified elsewhere: Secondary | ICD-10-CM | POA: Diagnosis not present

## 2013-11-10 DIAGNOSIS — K1231 Oral mucositis (ulcerative) due to antineoplastic therapy: Secondary | ICD-10-CM | POA: Diagnosis not present

## 2013-11-10 DIAGNOSIS — E46 Unspecified protein-calorie malnutrition: Secondary | ICD-10-CM | POA: Diagnosis not present

## 2013-11-10 MED ORDER — POTASSIUM CHLORIDE CRYS ER 20 MEQ PO TBCR: 20.0000 meq | EXTENDED_RELEASE_TABLET | Freq: Two times a day (BID) | ORAL | Status: DC

## 2013-11-10 NOTE — Telephone Encounter (Signed)
Per Dr. Truett Perna : OK to refill K+ and Imodium. Discontinue phosphate tablet.

## 2013-11-10 NOTE — Telephone Encounter (Signed)
Left VM for daughter that K+ and Imodium have been refilled and to d/c phosphorous.

## 2013-11-11 ENCOUNTER — Ambulatory Visit
Admission: RE | Admit: 2013-11-11 | Discharge: 2013-11-11 | Disposition: A | Discharge status: Home / Self Care | Payer: Medicare Other | Source: Ambulatory Visit | Attending: Radiation Oncology | Admitting: Radiation Oncology

## 2013-11-11 ENCOUNTER — Encounter: Payer: Self-pay | Admitting: Radiation Oncology

## 2013-11-11 ENCOUNTER — Telehealth: Payer: Self-pay | Admitting: *Deleted

## 2013-11-11 VITALS — BP 128/74 | HR 69 | Temp 98.0°F | Ht 64.9 in | Wt 141.1 lb

## 2013-11-11 DIAGNOSIS — K1231 Oral mucositis (ulcerative) due to antineoplastic therapy: Secondary | ICD-10-CM | POA: Diagnosis not present

## 2013-11-11 DIAGNOSIS — R5081 Fever presenting with conditions classified elsewhere: Secondary | ICD-10-CM | POA: Diagnosis not present

## 2013-11-11 DIAGNOSIS — D709 Neutropenia, unspecified: Secondary | ICD-10-CM | POA: Diagnosis not present

## 2013-11-11 DIAGNOSIS — D696 Thrombocytopenia, unspecified: Secondary | ICD-10-CM | POA: Diagnosis not present

## 2013-11-11 DIAGNOSIS — C210 Malignant neoplasm of anus, unspecified: Secondary | ICD-10-CM

## 2013-11-11 DIAGNOSIS — Z510 Encounter for antineoplastic radiation therapy: Secondary | ICD-10-CM | POA: Diagnosis not present

## 2013-11-11 DIAGNOSIS — E46 Unspecified protein-calorie malnutrition: Secondary | ICD-10-CM | POA: Diagnosis not present

## 2013-11-11 DIAGNOSIS — Z5111 Encounter for antineoplastic chemotherapy: Secondary | ICD-10-CM | POA: Diagnosis not present

## 2013-11-11 DIAGNOSIS — K121 Other forms of stomatitis: Secondary | ICD-10-CM | POA: Diagnosis not present

## 2013-11-11 DIAGNOSIS — D708 Other neutropenia: Secondary | ICD-10-CM | POA: Diagnosis not present

## 2013-11-11 NOTE — Progress Notes (Signed)
Ms. Warr has received 213 fractions to the anus.  She c/o soreness of the rectal region, but denies any bleeding.  Intermittent loose/diarrheal stools , but none today.  She admits to feeling "real tired."   Accompanied by family member today.

## 2013-11-11 NOTE — Telephone Encounter (Signed)
Received phone call from patient's daughter inquiring why PICC line being placed on 11/18/13 for treatment on 11/21/13.  This RN called Radiology and spoke with Olegario Messier who stated the schedule is completely booked for 11/21/13, no openings.  She stated they do not have a wait list.  This RN informed patient's daughter of above and explained reason to her.  She was accepting this and had no further questions.  She stated they would keep PICC line appointment for 11/18/13.  Dr. Kalman Drape nurse, Darl Pikes informed of above.

## 2013-11-11 NOTE — Progress Notes (Signed)
Department of Radiation Oncology  Phone:  [phone] Fax:        [phone]  Weekly Treatment Note    Name: Cindy [name] Date: 11/11/2013 MRN: [id number] DOB: 1930-09-06   Current dose: 41.4 Gy  Current fraction:23   MEDICATIONS: Current Outpatient Prescriptions  Medication Sig Dispense Refill  . ALPRAZolam (XANAX) 0.25 MG tablet TAKE 1 TABLET BY MOUTH 3 TIMES A DAY  60 tablet  0  . Alum & Mag Hydroxide-Simeth (MAGIC MOUTHWASH W/LIDOCAINE) SOLN Take 10 mLs by mouth 4 (four) times daily as needed for mouth pain.  500 mL  2  . estrogens, conjugated, (PREMARIN) 0.625 MG tablet Take 0.625 mg by mouth daily.       Marland Kitchen FLUoxetine (PROZAC) 40 MG capsule Take 40 mg by mouth every morning.       Marland Kitchen HYDROcodone-acetaminophen (NORCO/VICODIN) 5-325 MG per tablet Take 1 tablet by mouth every 4 (four) hours as needed for pain.  60 tablet  0  . levothyroxine (SYNTHROID, LEVOTHROID) 100 MCG tablet Take 100 mcg by mouth every morning.       . lidocaine (XYLOCAINE) 5 % ointment Apply topically as needed.  35.44 g  3  . loperamide (IMODIUM) 2 MG capsule TAKE 1 TO 2 CAPSULES AS NEEDED FOR DIARRHEA OR LOOSE STOOLS **MA OF 8 CAPS PER DAY  60 capsule  2  . Multiple Vitamin (MULTIVITAMIN WITH MINERALS) TABS tablet Take 1 tablet by mouth daily.  30 tablet  3  . ondansetron (ZOFRAN) 8 MG tablet Take 1 tablet (8 mg total) by mouth every 8 (eight) hours as needed for nausea.  20 tablet  1  . potassium chloride SA (K-DUR,KLOR-CON) 20 MEQ tablet Take 1 tablet (20 mEq total) by mouth 2 (two) times daily.  60 tablet  1  . traMADol (ULTRAM) 50 MG tablet Take 1 tablet (50 mg total) by mouth every 6 (six) hours as needed for pain.  30 tablet  1  . trolamine salicylate (ASPERCREME) 10 % cream Apply 1 application topically as needed (pain).      Marland Kitchen zolpidem (AMBIEN) 5 MG tablet Take 1 tablet (5 mg total) by mouth at bedtime as needed for sleep.  30 tablet  0   No current facility-administered medications for  this encounter.     ALLERGIES: Morphine and related   LABORATORY DATA:  Lab Results  Component Value Date   WBC 2.3* 11/04/2013   HGB 8.3* 11/04/2013   HCT 25.2* 11/04/2013   MCV 90.6 11/04/2013   PLT 390 11/04/2013   Lab Results  Component Value Date   NA 135* 11/04/2013   K 3.8 11/04/2013   CL 101 10/27/2013   CO2 24 11/04/2013   Lab Results  Component Value Date   ALT 29 11/04/2013   AST 29 11/04/2013   ALKPHOS 60 11/04/2013   BILITOT 0.21 11/04/2013     NARRATIVE: Cindy [name] was seen today for weekly treatment management. The chart was checked and the patient's films were reviewed. The patient is stable today. Using sitz baths which has helped a lot.   PHYSICAL EXAMINATION: height is 5' 4.9" (1.648 m) and weight is 141 lb 1.6 oz (64.003 kg). Her temperature is 98 F (36.7 C). Her blood pressure is 128/74 and her pulse is 69.        ASSESSMENT: The patient is doing satisfactorily with treatment.  PLAN: We will continue with the patient's radiation treatment as planned.

## 2013-11-13 ENCOUNTER — Ambulatory Visit
Admission: RE | Admit: 2013-11-13 | Discharge: 2013-11-13 | Disposition: A | Discharge status: Home / Self Care | Payer: Medicare Other | Source: Ambulatory Visit | Attending: Radiation Oncology | Admitting: Radiation Oncology

## 2013-11-13 DIAGNOSIS — D696 Thrombocytopenia, unspecified: Secondary | ICD-10-CM | POA: Diagnosis not present

## 2013-11-13 DIAGNOSIS — D709 Neutropenia, unspecified: Secondary | ICD-10-CM | POA: Diagnosis not present

## 2013-11-13 DIAGNOSIS — Z510 Encounter for antineoplastic radiation therapy: Secondary | ICD-10-CM | POA: Diagnosis not present

## 2013-11-13 DIAGNOSIS — K121 Other forms of stomatitis: Secondary | ICD-10-CM | POA: Diagnosis not present

## 2013-11-13 DIAGNOSIS — C210 Malignant neoplasm of anus, unspecified: Secondary | ICD-10-CM | POA: Diagnosis not present

## 2013-11-13 DIAGNOSIS — R5081 Fever presenting with conditions classified elsewhere: Secondary | ICD-10-CM | POA: Diagnosis not present

## 2013-11-14 ENCOUNTER — Ambulatory Visit
Admission: RE | Admit: 2013-11-14 | Discharge: 2013-11-14 | Disposition: A | Discharge status: Home / Self Care | Payer: Medicare Other | Source: Ambulatory Visit | Attending: Radiation Oncology | Admitting: Radiation Oncology

## 2013-11-14 ENCOUNTER — Telehealth: Payer: Self-pay | Admitting: *Deleted

## 2013-11-14 DIAGNOSIS — K121 Other forms of stomatitis: Secondary | ICD-10-CM | POA: Diagnosis not present

## 2013-11-14 DIAGNOSIS — Z5111 Encounter for antineoplastic chemotherapy: Secondary | ICD-10-CM | POA: Diagnosis not present

## 2013-11-14 DIAGNOSIS — K1231 Oral mucositis (ulcerative) due to antineoplastic therapy: Secondary | ICD-10-CM | POA: Diagnosis not present

## 2013-11-14 DIAGNOSIS — Z510 Encounter for antineoplastic radiation therapy: Secondary | ICD-10-CM | POA: Diagnosis not present

## 2013-11-14 DIAGNOSIS — C210 Malignant neoplasm of anus, unspecified: Secondary | ICD-10-CM | POA: Diagnosis not present

## 2013-11-14 DIAGNOSIS — E46 Unspecified protein-calorie malnutrition: Secondary | ICD-10-CM | POA: Diagnosis not present

## 2013-11-14 DIAGNOSIS — D709 Neutropenia, unspecified: Secondary | ICD-10-CM | POA: Diagnosis not present

## 2013-11-14 DIAGNOSIS — D696 Thrombocytopenia, unspecified: Secondary | ICD-10-CM | POA: Diagnosis not present

## 2013-11-14 DIAGNOSIS — D708 Other neutropenia: Secondary | ICD-10-CM | POA: Diagnosis not present

## 2013-11-14 DIAGNOSIS — R5081 Fever presenting with conditions classified elsewhere: Secondary | ICD-10-CM | POA: Diagnosis not present

## 2013-11-14 NOTE — Progress Notes (Signed)
Called and spoke with patient's daughter  rx for tub chair bench ready can pick up today, but Ambien Rx cannot be written till 11/25/13, last written on 10/27/13, with 30 tabs, too early to write another rx, Dr.moody will write nextx wek for her,3:58 PM

## 2013-11-14 NOTE — Telephone Encounter (Signed)
error 

## 2013-11-15 ENCOUNTER — Telehealth: Payer: Self-pay | Admitting: *Deleted

## 2013-11-15 ENCOUNTER — Ambulatory Visit
Admission: RE | Admit: 2013-11-15 | Discharge: 2013-11-15 | Disposition: A | Discharge status: Home / Self Care | Payer: Medicare Other | Source: Ambulatory Visit | Attending: Radiation Oncology | Admitting: Radiation Oncology

## 2013-11-15 ENCOUNTER — Other Ambulatory Visit: Payer: Self-pay | Admitting: *Deleted

## 2013-11-15 DIAGNOSIS — D696 Thrombocytopenia, unspecified: Secondary | ICD-10-CM | POA: Diagnosis not present

## 2013-11-15 DIAGNOSIS — R5081 Fever presenting with conditions classified elsewhere: Secondary | ICD-10-CM | POA: Diagnosis not present

## 2013-11-15 DIAGNOSIS — Z510 Encounter for antineoplastic radiation therapy: Secondary | ICD-10-CM | POA: Diagnosis not present

## 2013-11-15 DIAGNOSIS — D709 Neutropenia, unspecified: Secondary | ICD-10-CM | POA: Diagnosis not present

## 2013-11-15 DIAGNOSIS — K121 Other forms of stomatitis: Secondary | ICD-10-CM | POA: Diagnosis not present

## 2013-11-15 DIAGNOSIS — C210 Malignant neoplasm of anus, unspecified: Secondary | ICD-10-CM | POA: Diagnosis not present

## 2013-11-15 NOTE — Telephone Encounter (Signed)
Received phone call from patient's daughter, Noreene Filbert.  She stated she was calling on behalf of her mother Cindy [name] was in the background).  She stated her mother has decided not to take chemo treatment scheduled for 11/21/13 She stated the PICC line appointment for 12/18/13 needs to be cancelled.  She stated " mother stated it was too rough to go through that again and that she has made this decision after much thought".  Her daughter stated her mother was weak and just does not want to go through another rough time like before with the chemo. Dr. Truett Perna informed and encouraged patient to have treatment for the highest chance of cure.  He also recommended taking all of her radiation treatments. This RN offered to have Dr. Truett Perna contact the patient or to come in and see Dr. Truett Perna to discuss.  She stated her mother had made up her mind not to have any more chemo, but that she would finish out the radiation treatments as planned. She declined offer to come in and discuss with MD.  She stated she would contact this office if her mother changes her mind.  Dr. Truett Perna and his nurse, Darl Pikes, were informed of above.

## 2013-11-15 NOTE — Progress Notes (Signed)
Gave rx for tub chair bench to patient's daughter at treatment area, patient daughter stated her mother doesn't want to take last chemotherapy  Too weak she is going to call Kenney Houseman, RN Dr. Kalman Drape nurse and discuss with her 2:58 PM

## 2013-11-16 ENCOUNTER — Ambulatory Visit
Admission: RE | Admit: 2013-11-16 | Discharge: 2013-11-16 | Disposition: A | Discharge status: Home / Self Care | Payer: Medicare Other | Source: Ambulatory Visit | Attending: Radiation Oncology | Admitting: Radiation Oncology

## 2013-11-16 DIAGNOSIS — K1231 Oral mucositis (ulcerative) due to antineoplastic therapy: Secondary | ICD-10-CM | POA: Diagnosis not present

## 2013-11-16 DIAGNOSIS — Z510 Encounter for antineoplastic radiation therapy: Secondary | ICD-10-CM | POA: Diagnosis not present

## 2013-11-16 DIAGNOSIS — D709 Neutropenia, unspecified: Secondary | ICD-10-CM | POA: Diagnosis not present

## 2013-11-16 DIAGNOSIS — Z5111 Encounter for antineoplastic chemotherapy: Secondary | ICD-10-CM | POA: Diagnosis not present

## 2013-11-16 DIAGNOSIS — C210 Malignant neoplasm of anus, unspecified: Secondary | ICD-10-CM | POA: Diagnosis not present

## 2013-11-16 DIAGNOSIS — R5081 Fever presenting with conditions classified elsewhere: Secondary | ICD-10-CM | POA: Diagnosis not present

## 2013-11-16 DIAGNOSIS — E46 Unspecified protein-calorie malnutrition: Secondary | ICD-10-CM | POA: Diagnosis not present

## 2013-11-16 DIAGNOSIS — K121 Other forms of stomatitis: Secondary | ICD-10-CM | POA: Diagnosis not present

## 2013-11-16 DIAGNOSIS — D708 Other neutropenia: Secondary | ICD-10-CM | POA: Diagnosis not present

## 2013-11-16 DIAGNOSIS — D696 Thrombocytopenia, unspecified: Secondary | ICD-10-CM | POA: Diagnosis not present

## 2013-11-18 ENCOUNTER — Ambulatory Visit (HOSPITAL_COMMUNITY): Payer: Medicare Other

## 2013-11-18 ENCOUNTER — Telehealth: Payer: Self-pay | Admitting: Oncology

## 2013-11-18 DIAGNOSIS — K1231 Oral mucositis (ulcerative) due to antineoplastic therapy: Secondary | ICD-10-CM | POA: Diagnosis not present

## 2013-11-18 DIAGNOSIS — Z5111 Encounter for antineoplastic chemotherapy: Secondary | ICD-10-CM | POA: Diagnosis not present

## 2013-11-18 DIAGNOSIS — E46 Unspecified protein-calorie malnutrition: Secondary | ICD-10-CM | POA: Diagnosis not present

## 2013-11-18 DIAGNOSIS — D708 Other neutropenia: Secondary | ICD-10-CM | POA: Diagnosis not present

## 2013-11-18 DIAGNOSIS — C210 Malignant neoplasm of anus, unspecified: Secondary | ICD-10-CM | POA: Diagnosis not present

## 2013-11-18 NOTE — Telephone Encounter (Signed)
Cancelled appts for chemo and IR per pt, Marcelino Duster aware of chemo cancellation for 12/1

## 2013-11-19 ENCOUNTER — Other Ambulatory Visit: Payer: Self-pay | Admitting: Radiation Oncology

## 2013-11-21 ENCOUNTER — Ambulatory Visit
Admission: RE | Admit: 2013-11-21 | Discharge: 2013-11-21 | Disposition: A | Discharge status: Home / Self Care | Payer: Medicare Other | Source: Ambulatory Visit | Attending: Radiation Oncology | Admitting: Radiation Oncology

## 2013-11-21 ENCOUNTER — Ambulatory Visit: Payer: Medicare Other

## 2013-11-21 DIAGNOSIS — D708 Other neutropenia: Secondary | ICD-10-CM | POA: Diagnosis not present

## 2013-11-21 DIAGNOSIS — Z510 Encounter for antineoplastic radiation therapy: Secondary | ICD-10-CM | POA: Diagnosis not present

## 2013-11-21 DIAGNOSIS — K121 Other forms of stomatitis: Secondary | ICD-10-CM | POA: Diagnosis not present

## 2013-11-21 DIAGNOSIS — C210 Malignant neoplasm of anus, unspecified: Secondary | ICD-10-CM

## 2013-11-21 DIAGNOSIS — E46 Unspecified protein-calorie malnutrition: Secondary | ICD-10-CM | POA: Diagnosis not present

## 2013-11-21 DIAGNOSIS — R5081 Fever presenting with conditions classified elsewhere: Secondary | ICD-10-CM | POA: Diagnosis not present

## 2013-11-21 DIAGNOSIS — Z5111 Encounter for antineoplastic chemotherapy: Secondary | ICD-10-CM | POA: Diagnosis not present

## 2013-11-21 DIAGNOSIS — D696 Thrombocytopenia, unspecified: Secondary | ICD-10-CM | POA: Diagnosis not present

## 2013-11-21 DIAGNOSIS — D709 Neutropenia, unspecified: Secondary | ICD-10-CM | POA: Diagnosis not present

## 2013-11-21 DIAGNOSIS — K1231 Oral mucositis (ulcerative) due to antineoplastic therapy: Secondary | ICD-10-CM | POA: Diagnosis not present

## 2013-11-21 NOTE — Progress Notes (Signed)
Department of Radiation Oncology  Phone:  [phone] Fax:        [phone]  Weekly Treatment Note    Name: Cindy [name] Date: 11/21/2013 MRN: [id number] DOB: [birth year]   Current dose: 50.4 Gy  Current fraction: 28   MEDICATIONS: Current Outpatient Prescriptions  Medication Sig Dispense Refill  . ALPRAZolam (XANAX) 0.25 MG tablet TAKE 1 TABLET BY MOUTH 3 TIMES A DAY  60 tablet  0  . Alum & Mag Hydroxide-Simeth (MAGIC MOUTHWASH W/LIDOCAINE) SOLN Take 10 mLs by mouth 4 (four) times daily as needed for mouth pain.  500 mL  2  . estrogens, conjugated, (PREMARIN) 0.625 MG tablet Take 0.625 mg by mouth daily.       Marland Kitchen FLUoxetine (PROZAC) 40 MG capsule Take 40 mg by mouth every morning.       Marland Kitchen HYDROcodone-acetaminophen (NORCO/VICODIN) 5-325 MG per tablet Take 1 tablet by mouth every 4 (four) hours as needed for pain.  60 tablet  0  . levothyroxine (SYNTHROID, LEVOTHROID) 100 MCG tablet Take 100 mcg by mouth every morning.       . lidocaine (XYLOCAINE) 5 % ointment Apply topically as needed.  35.44 g  3  . loperamide (IMODIUM) 2 MG capsule TAKE 1 TO 2 CAPSULES AS NEEDED FOR DIARRHEA OR LOOSE STOOLS **MA OF 8 CAPS PER DAY  60 capsule  2  . Multiple Vitamin (MULTIVITAMIN WITH MINERALS) TABS tablet Take 1 tablet by mouth daily.  30 tablet  3  . ondansetron (ZOFRAN) 8 MG tablet Take 1 tablet (8 mg total) by mouth every 8 (eight) hours as needed for nausea.  20 tablet  1  . potassium chloride SA (K-DUR,KLOR-CON) 20 MEQ tablet Take 1 tablet (20 mEq total) by mouth 2 (two) times daily.  60 tablet  1  . traMADol (ULTRAM) 50 MG tablet Take 1 tablet (50 mg total) by mouth every 6 (six) hours as needed for pain.  30 tablet  1  . trolamine salicylate (ASPERCREME) 10 % cream Apply 1 application topically as needed (pain).      Marland Kitchen zolpidem (AMBIEN) 5 MG tablet Take 1 tablet (5 mg total) by mouth at bedtime as needed for sleep.  30 tablet  0   No current facility-administered medications for  this encounter.     ALLERGIES: Morphine and related   LABORATORY DATA:  Lab Results  Component Value Date   WBC 2.3* 11/04/2013   HGB 8.3* 11/04/2013   HCT 25.2* 11/04/2013   MCV 90.6 11/04/2013   PLT 390 11/04/2013   Lab Results  Component Value Date   NA 135* 11/04/2013   K 3.8 11/04/2013   CL 101 10/27/2013   CO2 24 11/04/2013   Lab Results  Component Value Date   ALT 29 11/04/2013   AST 29 11/04/2013   ALKPHOS 60 11/04/2013   BILITOT 0.21 11/04/2013     NARRATIVE: Cindy [name] was seen today for weekly treatment management. The chart was checked and the patient's films were reviewed. The patient states that she continues to do fairly well. She has 2 more fractions remaining. Irritative symptoms without any major changes.  PHYSICAL EXAMINATION: vitals were not taken for this visit.     the patient's scan shows radiation change in the anal region with erythema. Overall the skin has held up quite well considering where she is in her treatment. No major areas of moist desquamation  ASSESSMENT: The patient is doing satisfactorily with treatment.  PLAN: We will  continue with the patient's radiation treatment as planned. She will finish in 2 more fractions in followup in our clinic in one month.

## 2013-11-21 NOTE — Progress Notes (Signed)
patient seen by Md before nursing rad tx done, rectal, , c/o irritated bottom, gave rx for her Xanax,Md saw patient's bottom broken skin in betyrween buttocks, and erythema 3:58 PM

## 2013-11-22 ENCOUNTER — Ambulatory Visit
Admission: RE | Admit: 2013-11-22 | Discharge: 2013-11-22 | Disposition: A | Discharge status: Home / Self Care | Payer: Medicare Other | Source: Ambulatory Visit | Attending: Radiation Oncology | Admitting: Radiation Oncology

## 2013-11-22 DIAGNOSIS — Z510 Encounter for antineoplastic radiation therapy: Secondary | ICD-10-CM | POA: Diagnosis not present

## 2013-11-22 DIAGNOSIS — E46 Unspecified protein-calorie malnutrition: Secondary | ICD-10-CM | POA: Diagnosis not present

## 2013-11-22 DIAGNOSIS — Z5111 Encounter for antineoplastic chemotherapy: Secondary | ICD-10-CM | POA: Diagnosis not present

## 2013-11-22 DIAGNOSIS — D696 Thrombocytopenia, unspecified: Secondary | ICD-10-CM | POA: Diagnosis not present

## 2013-11-22 DIAGNOSIS — D708 Other neutropenia: Secondary | ICD-10-CM | POA: Diagnosis not present

## 2013-11-22 DIAGNOSIS — D709 Neutropenia, unspecified: Secondary | ICD-10-CM | POA: Diagnosis not present

## 2013-11-22 DIAGNOSIS — R5081 Fever presenting with conditions classified elsewhere: Secondary | ICD-10-CM | POA: Diagnosis not present

## 2013-11-22 DIAGNOSIS — K1231 Oral mucositis (ulcerative) due to antineoplastic therapy: Secondary | ICD-10-CM | POA: Diagnosis not present

## 2013-11-22 DIAGNOSIS — K121 Other forms of stomatitis: Secondary | ICD-10-CM | POA: Diagnosis not present

## 2013-11-22 DIAGNOSIS — C210 Malignant neoplasm of anus, unspecified: Secondary | ICD-10-CM | POA: Diagnosis not present

## 2013-11-23 ENCOUNTER — Telehealth: Payer: Self-pay | Admitting: Dietician

## 2013-11-23 ENCOUNTER — Ambulatory Visit: Payer: Medicare Other

## 2013-11-23 ENCOUNTER — Ambulatory Visit
Admission: RE | Admit: 2013-11-23 | Discharge: 2013-11-23 | Disposition: A | Discharge status: Home / Self Care | Payer: Medicare Other | Source: Ambulatory Visit | Attending: Radiation Oncology | Admitting: Radiation Oncology

## 2013-11-23 ENCOUNTER — Encounter: Payer: Self-pay | Admitting: Radiation Oncology

## 2013-11-23 DIAGNOSIS — Z510 Encounter for antineoplastic radiation therapy: Secondary | ICD-10-CM | POA: Diagnosis not present

## 2013-11-23 DIAGNOSIS — D696 Thrombocytopenia, unspecified: Secondary | ICD-10-CM | POA: Diagnosis not present

## 2013-11-23 DIAGNOSIS — C210 Malignant neoplasm of anus, unspecified: Secondary | ICD-10-CM | POA: Diagnosis not present

## 2013-11-23 DIAGNOSIS — K121 Other forms of stomatitis: Secondary | ICD-10-CM | POA: Diagnosis not present

## 2013-11-23 DIAGNOSIS — D709 Neutropenia, unspecified: Secondary | ICD-10-CM | POA: Diagnosis not present

## 2013-11-23 DIAGNOSIS — R5081 Fever presenting with conditions classified elsewhere: Secondary | ICD-10-CM | POA: Diagnosis not present

## 2013-11-23 NOTE — Telephone Encounter (Signed)
Brief Outpatient Oncology Nutrition Note  Patient has been identified to be at risk on malnutrition screen.  Wt Readings from Last 10 Encounters:  11/11/13 141 lb 1.6 oz (64.003 kg)  11/04/13 143 lb 1.6 oz (64.91 kg)  11/04/13 145 lb 9.6 oz (66.044 kg)  10/23/13 146 lb 9.7 oz (66.5 kg)  10/19/13 146 lb 11.2 oz (66.543 kg)  10/17/13 146 lb 6.4 oz (66.407 kg)  10/13/13 154 lb 3.2 oz (69.945 kg)  10/10/13 148 lb 14.4 oz (67.541 kg)  09/19/13 149 lb 8 oz (67.813 kg)  09/09/13 148 lb 9.6 oz (67.405 kg)   Called patient due to continued weight loss.  Noted hospital admission one month ago.  Seen by RD at that time.  Patient with rectal squamous cell CA.  Was having problems with mouth sores and diarrhea at that time.    Patient reports resolved mouth sores but poor appetite and intake.  Does not want anything sweet and does not like the Ensure.  Discussed alternative options such as the clear liquid supplements mixed with ginger ale or true lemon.  With mail patient tips for a low appetite with [facility]'s contact information.    Oran Rein, RD, LDN

## 2013-11-24 ENCOUNTER — Ambulatory Visit: Payer: Medicare Other

## 2013-11-25 DIAGNOSIS — C210 Malignant neoplasm of anus, unspecified: Secondary | ICD-10-CM | POA: Diagnosis not present

## 2013-11-25 DIAGNOSIS — D708 Other neutropenia: Secondary | ICD-10-CM | POA: Diagnosis not present

## 2013-11-25 DIAGNOSIS — K1231 Oral mucositis (ulcerative) due to antineoplastic therapy: Secondary | ICD-10-CM | POA: Diagnosis not present

## 2013-11-25 DIAGNOSIS — E46 Unspecified protein-calorie malnutrition: Secondary | ICD-10-CM | POA: Diagnosis not present

## 2013-11-25 DIAGNOSIS — Z5111 Encounter for antineoplastic chemotherapy: Secondary | ICD-10-CM | POA: Diagnosis not present

## 2013-11-29 DIAGNOSIS — K1231 Oral mucositis (ulcerative) due to antineoplastic therapy: Secondary | ICD-10-CM | POA: Diagnosis not present

## 2013-11-29 DIAGNOSIS — E46 Unspecified protein-calorie malnutrition: Secondary | ICD-10-CM | POA: Diagnosis not present

## 2013-11-29 DIAGNOSIS — C210 Malignant neoplasm of anus, unspecified: Secondary | ICD-10-CM | POA: Diagnosis not present

## 2013-11-29 DIAGNOSIS — Z5111 Encounter for antineoplastic chemotherapy: Secondary | ICD-10-CM | POA: Diagnosis not present

## 2013-11-29 DIAGNOSIS — D708 Other neutropenia: Secondary | ICD-10-CM | POA: Diagnosis not present

## 2013-11-30 DIAGNOSIS — Z5111 Encounter for antineoplastic chemotherapy: Secondary | ICD-10-CM | POA: Diagnosis not present

## 2013-11-30 DIAGNOSIS — C210 Malignant neoplasm of anus, unspecified: Secondary | ICD-10-CM | POA: Diagnosis not present

## 2013-11-30 DIAGNOSIS — K1231 Oral mucositis (ulcerative) due to antineoplastic therapy: Secondary | ICD-10-CM | POA: Diagnosis not present

## 2013-11-30 DIAGNOSIS — D708 Other neutropenia: Secondary | ICD-10-CM | POA: Diagnosis not present

## 2013-11-30 DIAGNOSIS — E46 Unspecified protein-calorie malnutrition: Secondary | ICD-10-CM | POA: Diagnosis not present

## 2013-12-02 ENCOUNTER — Ambulatory Visit (HOSPITAL_BASED_OUTPATIENT_CLINIC_OR_DEPARTMENT_OTHER): Payer: Medicare Other | Admitting: Oncology

## 2013-12-02 ENCOUNTER — Ambulatory Visit (HOSPITAL_BASED_OUTPATIENT_CLINIC_OR_DEPARTMENT_OTHER): Payer: Medicare Other

## 2013-12-02 ENCOUNTER — Telehealth: Payer: Self-pay | Admitting: Oncology

## 2013-12-02 ENCOUNTER — Telehealth: Payer: Self-pay | Admitting: *Deleted

## 2013-12-02 VITALS — BP 130/56 | HR 68 | Temp 98.1°F | Resp 18 | Ht 64.9 in | Wt 143.5 lb

## 2013-12-02 DIAGNOSIS — R300 Dysuria: Secondary | ICD-10-CM

## 2013-12-02 DIAGNOSIS — R110 Nausea: Secondary | ICD-10-CM | POA: Diagnosis not present

## 2013-12-02 DIAGNOSIS — R5381 Other malaise: Secondary | ICD-10-CM | POA: Diagnosis not present

## 2013-12-02 DIAGNOSIS — R3915 Urgency of urination: Secondary | ICD-10-CM | POA: Diagnosis not present

## 2013-12-02 DIAGNOSIS — R350 Frequency of micturition: Secondary | ICD-10-CM | POA: Diagnosis not present

## 2013-12-02 DIAGNOSIS — C210 Malignant neoplasm of anus, unspecified: Secondary | ICD-10-CM

## 2013-12-02 DIAGNOSIS — R197 Diarrhea, unspecified: Secondary | ICD-10-CM

## 2013-12-02 LAB — URINALYSIS, MICROSCOPIC - CHCC
Blood: NEGATIVE
Glucose: NEGATIVE mg/dL
Nitrite: NEGATIVE
Specific Gravity, Urine: 1.02 (ref 1.003–1.035)
Urobilinogen, UR: 0.2 mg/dL (ref 0.2–1)
pH: 6 (ref 4.6–8.0)

## 2013-12-02 LAB — CBC WITH DIFFERENTIAL/PLATELET
Basophils Absolute: 0.1 10*3/uL (ref 0.0–0.1)
EOS%: 0.8 % (ref 0.0–7.0)
Eosinophils Absolute: 0.1 10*3/uL (ref 0.0–0.5)
HCT: 30.4 % — ABNORMAL LOW (ref 34.8–46.6)
HGB: 10.3 g/dL — ABNORMAL LOW (ref 11.6–15.9)
LYMPH%: 52.8 % — ABNORMAL HIGH (ref 14.0–49.7)
MONO#: 1 10*3/uL — ABNORMAL HIGH (ref 0.1–0.9)
NEUT#: 4 10*3/uL (ref 1.5–6.5)
Platelets: 260 10*3/uL (ref 145–400)
RBC: 3.21 10*6/uL — ABNORMAL LOW (ref 3.70–5.45)
RDW: 19.2 % — ABNORMAL HIGH (ref 11.2–14.5)
WBC: 10.9 10*3/uL — ABNORMAL HIGH (ref 3.9–10.3)

## 2013-12-02 LAB — COMPREHENSIVE METABOLIC PANEL (CC13)
ALT: 31 U/L (ref 0–55)
Albumin: 3.1 g/dL — ABNORMAL LOW (ref 3.5–5.0)
Anion Gap: 9 mEq/L (ref 3–11)
BUN: 7.5 mg/dL (ref 7.0–26.0)
CO2: 22 mEq/L (ref 22–29)
Glucose: 99 mg/dl (ref 70–140)
Potassium: 4.3 mEq/L (ref 3.5–5.1)
Sodium: 130 mEq/L — ABNORMAL LOW (ref 136–145)
Total Protein: 7.2 g/dL (ref 6.4–8.3)

## 2013-12-02 LAB — TECHNOLOGIST REVIEW

## 2013-12-02 NOTE — Progress Notes (Signed)
   [facility]   OFFICE PROGRESS NOTE   INTERVAL HISTORY:   She returns for scheduled followup of anal cancer. She completed radiation 11/23/2013. She decided to not receive the second cycle of chemotherapy.  [name] complains of decreased energy and anorexia. She has urinary frequency and urgency. The urine volume is diminished. She complains of burning with bowel movements. The rectal pain is much improved compared to pretreatment.  Objective:  Vital signs in last 24 hours:  Blood pressure 130/56, pulse 68, temperature 98.1 F (36.7 C), temperature source Oral, resp. rate 18, height 5' 4.9" (1.648 m), weight 143 lb 8 oz (65.091 kg).    HEENT: No thrush or ulcers Lymphatics: ? 1/2 cm firm left inguinal node that is superior to the inguinal canal. Stable bilateral cutaneous nodular areas in the suprapubic region Resp: End inspiratory rhonchi at the posterior bases bilaterally, no respiratory distress Cardio: Regular rate and rhythm GI: No hepatomegaly, nontender, no mass Vascular: No leg edema  Skin: Mild erythema at the upper gluteal fold and perineum without skin breakdown     Lab Results:  Lab Results  Component Value Date   WBC 10.9* 12/02/2013   HGB 10.3* 12/02/2013   HCT 30.4* 12/02/2013   MCV 94.7 12/02/2013   PLT 260 12/02/2013   ANC 4.0   Medications: I have reviewed the patient's current medications.  Assessment/Plan: 1. Squamous cell carcinoma of the anus. Staging PET scan 09/23/2013 of a hypermetabolic anal mass and hypermetabolic right inguinal/perineal nodes. Radiation and cycle 1 5-FU/mitomycin C. beginning 10/10/2013.  Radiation completed 11/23/2013, she declined the second cycle of chemotherapy 2. Question palpable inguinal/pubic lymphadenopathy. The suprapubic palpable lesions appeared hypermetabolic on the PET scan. 3. Rectal pain and bleeding secondary to #1. Improved. 4. Nausea predating chemotherapy.   5. Thickening/hypermetabolism at the splenic flexure. Question second primary. 6. Mucositis secondary to chemotherapy. Resolved 7. Skin rash likely related to 5-fluorouracil. Resolved 8. Pancytopenia secondary to chemotherapy. Improved.  9. Diarrhea-likely secondary to chemotherapy induced enteritis , C. Difficile negative 10/23/2013.  10. History of Hypokalemia secondary to diarrhea 11. Hospitalization with febrile neutropenia 10/23/2013 through 10/27/2013. Cultures negative. 12. 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 I expect the malaise, dysuria, and rectal "burning" to improve over the next few weeks. She is scheduled to see Dr. Mitzi Hansen on 12/29/2012. We will make a referral to Dr. Maisie Fus for a repeat anal examination. Cindy [name] will return for an office visit here in 3 months.  The decision for repeat imaging can be based on the evaluation by Dr. Maisie Fus.   Thornton Papas, MD  12/02/2013  1:40 PM

## 2013-12-02 NOTE — Telephone Encounter (Signed)
Message copied by Wandalee Ferdinand on Fri Dec 02, 2013  4:02 PM ------      Message from: Thornton Papas B      Created: Fri Dec 02, 2013  4:00 PM       Please call patient, potassium is normal, she can stop the kcl if the diarrhea has resolved ------

## 2013-12-02 NOTE — Telephone Encounter (Signed)
Notified of Cmet results. She reports her diarrhea is resolved, so she will discontinue the K+ supplement. Made her aware her urine culture results should be back by Monday.

## 2013-12-02 NOTE — Progress Notes (Signed)
  Radiation Oncology         [phone] ________________________________  Name: MARCELIA PETERSEN MRN: [id number]  Date: 11/23/2013  DOB: [birth year]  End of Treatment Note  Diagnosis:   Carcinoma of the anal canal     Indication for treatment:  Curative   Treatment dates:  10/10/2013 through 11/23/2013  Site/dose:    The patient was treated to the anal tumor as well as the regional lymph nodes. The primary tumor received 54 gray at 1.8 gray per fraction. The patient's treatment consisted of a IMRT technique with daily image guidance.  Narrative: The patient tolerated radiation treatment relatively well.    The patient had some significant skin irritation towards the end of treatment but her symptoms were quite manageable.  Plan: The patient has completed radiation treatment. The patient will return to radiation oncology clinic for routine followup in one month. I advised the patient to call or return sooner if they have any questions or concerns related to their recovery or treatment. ________________________________  Radene Gunning, M.D., Ph.D.

## 2013-12-02 NOTE — Telephone Encounter (Signed)
gv and printed papt sched and avs for pt for Jan and March 2015.Marland KitchenMarland Kitchenpt sched to see Romie Levee 1.13.14 @ 9am

## 2013-12-02 NOTE — Progress Notes (Signed)
Met with patient and her daughter.  Patient states she is feeling better and a little stronger.  Patient states appetite is still not very good.  She has not been drinking the supplements due to them being "too sweet".  This RN offered to make appointment to see dietician, but she declined.  Patient requested information on different types of drink supplements to try.  Spoke with dietician, Vernell Leep, who provided Ensure Plus and Ensure juice supplements to try with lemon juice or ginger ale.  Patient and daughter expressed appreciation.  Patient denied any other barriers to care at present time.  Will continue to follow as needed.

## 2013-12-05 ENCOUNTER — Telehealth: Payer: Self-pay | Admitting: *Deleted

## 2013-12-05 DIAGNOSIS — D708 Other neutropenia: Secondary | ICD-10-CM | POA: Diagnosis not present

## 2013-12-05 DIAGNOSIS — C210 Malignant neoplasm of anus, unspecified: Secondary | ICD-10-CM | POA: Diagnosis not present

## 2013-12-05 DIAGNOSIS — E46 Unspecified protein-calorie malnutrition: Secondary | ICD-10-CM | POA: Diagnosis not present

## 2013-12-05 DIAGNOSIS — Z5111 Encounter for antineoplastic chemotherapy: Secondary | ICD-10-CM | POA: Diagnosis not present

## 2013-12-05 DIAGNOSIS — K1231 Oral mucositis (ulcerative) due to antineoplastic therapy: Secondary | ICD-10-CM | POA: Diagnosis not present

## 2013-12-05 NOTE — Telephone Encounter (Signed)
Message copied by Caleb Popp on Mon Dec 05, 2013 10:16 AM ------      Message from: Ladene Artist      Created: Sun Dec 04, 2013 10:16 AM       Please call patient, urine culture is negative, symptoms are likely related to radiation,call for persistent dysuria ------

## 2013-12-05 NOTE — Telephone Encounter (Signed)
Called pt with urine culture results. Negative, per Dr. Truett Perna. Symptoms are likely side effects of radiation. She understands to call the office for persistent dysuria.

## 2013-12-07 DIAGNOSIS — K1231 Oral mucositis (ulcerative) due to antineoplastic therapy: Secondary | ICD-10-CM | POA: Diagnosis not present

## 2013-12-07 DIAGNOSIS — D708 Other neutropenia: Secondary | ICD-10-CM | POA: Diagnosis not present

## 2013-12-07 DIAGNOSIS — C210 Malignant neoplasm of anus, unspecified: Secondary | ICD-10-CM | POA: Diagnosis not present

## 2013-12-07 DIAGNOSIS — E46 Unspecified protein-calorie malnutrition: Secondary | ICD-10-CM | POA: Diagnosis not present

## 2013-12-07 DIAGNOSIS — Z5111 Encounter for antineoplastic chemotherapy: Secondary | ICD-10-CM | POA: Diagnosis not present

## 2013-12-09 DIAGNOSIS — E46 Unspecified protein-calorie malnutrition: Secondary | ICD-10-CM | POA: Diagnosis not present

## 2013-12-09 DIAGNOSIS — K1231 Oral mucositis (ulcerative) due to antineoplastic therapy: Secondary | ICD-10-CM | POA: Diagnosis not present

## 2013-12-09 DIAGNOSIS — Z5111 Encounter for antineoplastic chemotherapy: Secondary | ICD-10-CM | POA: Diagnosis not present

## 2013-12-09 DIAGNOSIS — C210 Malignant neoplasm of anus, unspecified: Secondary | ICD-10-CM | POA: Diagnosis not present

## 2013-12-09 DIAGNOSIS — D708 Other neutropenia: Secondary | ICD-10-CM | POA: Diagnosis not present

## 2013-12-12 DIAGNOSIS — E46 Unspecified protein-calorie malnutrition: Secondary | ICD-10-CM | POA: Diagnosis not present

## 2013-12-12 DIAGNOSIS — D708 Other neutropenia: Secondary | ICD-10-CM | POA: Diagnosis not present

## 2013-12-12 DIAGNOSIS — Z5111 Encounter for antineoplastic chemotherapy: Secondary | ICD-10-CM | POA: Diagnosis not present

## 2013-12-12 DIAGNOSIS — C210 Malignant neoplasm of anus, unspecified: Secondary | ICD-10-CM | POA: Diagnosis not present

## 2013-12-12 DIAGNOSIS — K1231 Oral mucositis (ulcerative) due to antineoplastic therapy: Secondary | ICD-10-CM | POA: Diagnosis not present

## 2013-12-19 DIAGNOSIS — E46 Unspecified protein-calorie malnutrition: Secondary | ICD-10-CM | POA: Diagnosis not present

## 2013-12-19 DIAGNOSIS — Z5111 Encounter for antineoplastic chemotherapy: Secondary | ICD-10-CM | POA: Diagnosis not present

## 2013-12-19 DIAGNOSIS — C210 Malignant neoplasm of anus, unspecified: Secondary | ICD-10-CM | POA: Diagnosis not present

## 2013-12-19 DIAGNOSIS — D708 Other neutropenia: Secondary | ICD-10-CM | POA: Diagnosis not present

## 2013-12-19 DIAGNOSIS — K1231 Oral mucositis (ulcerative) due to antineoplastic therapy: Secondary | ICD-10-CM | POA: Diagnosis not present

## 2013-12-21 DIAGNOSIS — E46 Unspecified protein-calorie malnutrition: Secondary | ICD-10-CM | POA: Diagnosis not present

## 2013-12-21 DIAGNOSIS — K1231 Oral mucositis (ulcerative) due to antineoplastic therapy: Secondary | ICD-10-CM | POA: Diagnosis not present

## 2013-12-21 DIAGNOSIS — D708 Other neutropenia: Secondary | ICD-10-CM | POA: Diagnosis not present

## 2013-12-21 DIAGNOSIS — C210 Malignant neoplasm of anus, unspecified: Secondary | ICD-10-CM | POA: Diagnosis not present

## 2013-12-21 DIAGNOSIS — Z5111 Encounter for antineoplastic chemotherapy: Secondary | ICD-10-CM | POA: Diagnosis not present

## 2013-12-22 ENCOUNTER — Other Ambulatory Visit: Payer: Self-pay | Admitting: Radiation Oncology

## 2013-12-23 DIAGNOSIS — K1231 Oral mucositis (ulcerative) due to antineoplastic therapy: Secondary | ICD-10-CM | POA: Diagnosis not present

## 2013-12-23 DIAGNOSIS — D708 Other neutropenia: Secondary | ICD-10-CM | POA: Diagnosis not present

## 2013-12-23 DIAGNOSIS — Z5111 Encounter for antineoplastic chemotherapy: Secondary | ICD-10-CM | POA: Diagnosis not present

## 2013-12-23 DIAGNOSIS — E46 Unspecified protein-calorie malnutrition: Secondary | ICD-10-CM | POA: Diagnosis not present

## 2013-12-23 DIAGNOSIS — C210 Malignant neoplasm of anus, unspecified: Secondary | ICD-10-CM | POA: Diagnosis not present

## 2013-12-24 ENCOUNTER — Emergency Department (HOSPITAL_COMMUNITY): Payer: Medicare Other

## 2013-12-24 ENCOUNTER — Encounter (HOSPITAL_COMMUNITY): Payer: Self-pay | Admitting: Emergency Medicine

## 2013-12-24 ENCOUNTER — Observation Stay (HOSPITAL_COMMUNITY)
Admission: EM | Admit: 2013-12-24 | Discharge: 2013-12-25 | Disposition: A | Discharge status: Home / Self Care | Payer: Medicare Other | Attending: Internal Medicine | Admitting: Internal Medicine

## 2013-12-24 DIAGNOSIS — E43 Unspecified severe protein-calorie malnutrition: Secondary | ICD-10-CM | POA: Diagnosis not present

## 2013-12-24 DIAGNOSIS — K219 Gastro-esophageal reflux disease without esophagitis: Secondary | ICD-10-CM | POA: Diagnosis not present

## 2013-12-24 DIAGNOSIS — R0609 Other forms of dyspnea: Secondary | ICD-10-CM | POA: Diagnosis not present

## 2013-12-24 DIAGNOSIS — C210 Malignant neoplasm of anus, unspecified: Secondary | ICD-10-CM | POA: Diagnosis not present

## 2013-12-24 DIAGNOSIS — R0602 Shortness of breath: Secondary | ICD-10-CM | POA: Diagnosis not present

## 2013-12-24 DIAGNOSIS — R5381 Other malaise: Secondary | ICD-10-CM | POA: Diagnosis not present

## 2013-12-24 DIAGNOSIS — E039 Hypothyroidism, unspecified: Secondary | ICD-10-CM | POA: Diagnosis not present

## 2013-12-24 DIAGNOSIS — E876 Hypokalemia: Secondary | ICD-10-CM | POA: Diagnosis not present

## 2013-12-24 DIAGNOSIS — C189 Malignant neoplasm of colon, unspecified: Secondary | ICD-10-CM | POA: Insufficient documentation

## 2013-12-24 DIAGNOSIS — Z79899 Other long term (current) drug therapy: Secondary | ICD-10-CM | POA: Insufficient documentation

## 2013-12-24 DIAGNOSIS — F419 Anxiety disorder, unspecified: Secondary | ICD-10-CM | POA: Diagnosis present

## 2013-12-24 DIAGNOSIS — R5383 Other fatigue: Secondary | ICD-10-CM | POA: Diagnosis not present

## 2013-12-24 DIAGNOSIS — I10 Essential (primary) hypertension: Secondary | ICD-10-CM | POA: Insufficient documentation

## 2013-12-24 DIAGNOSIS — R079 Chest pain, unspecified: Secondary | ICD-10-CM | POA: Diagnosis not present

## 2013-12-24 DIAGNOSIS — E785 Hyperlipidemia, unspecified: Secondary | ICD-10-CM | POA: Diagnosis not present

## 2013-12-24 DIAGNOSIS — G2581 Restless legs syndrome: Secondary | ICD-10-CM | POA: Diagnosis present

## 2013-12-24 DIAGNOSIS — R0600 Dyspnea, unspecified: Secondary | ICD-10-CM | POA: Diagnosis present

## 2013-12-24 DIAGNOSIS — R0789 Other chest pain: Secondary | ICD-10-CM | POA: Diagnosis not present

## 2013-12-24 DIAGNOSIS — R531 Weakness: Secondary | ICD-10-CM | POA: Diagnosis present

## 2013-12-24 DIAGNOSIS — F411 Generalized anxiety disorder: Secondary | ICD-10-CM | POA: Insufficient documentation

## 2013-12-24 DIAGNOSIS — R0989 Other specified symptoms and signs involving the circulatory and respiratory systems: Principal | ICD-10-CM | POA: Insufficient documentation

## 2013-12-24 LAB — CBC WITH DIFFERENTIAL/PLATELET
BASOS PCT: 0 % (ref 0–1)
Basophils Absolute: 0 10*3/uL (ref 0.0–0.1)
EOS ABS: 0.6 10*3/uL (ref 0.0–0.7)
EOS PCT: 6 % — AB (ref 0–5)
HCT: 31.5 % — ABNORMAL LOW (ref 36.0–46.0)
Hemoglobin: 10.6 g/dL — ABNORMAL LOW (ref 12.0–15.0)
Lymphocytes Relative: 53 % — ABNORMAL HIGH (ref 12–46)
Lymphs Abs: 4.9 10*3/uL — ABNORMAL HIGH (ref 0.7–4.0)
MCH: 32.3 pg (ref 26.0–34.0)
MCHC: 33.7 g/dL (ref 30.0–36.0)
MCV: 96 fL (ref 78.0–100.0)
Monocytes Absolute: 0.6 10*3/uL (ref 0.1–1.0)
Monocytes Relative: 6 % (ref 3–12)
Neutro Abs: 3.2 10*3/uL (ref 1.7–7.7)
Neutrophils Relative %: 34 % — ABNORMAL LOW (ref 43–77)
PLATELETS: 351 10*3/uL (ref 150–400)
RBC: 3.28 MIL/uL — AB (ref 3.87–5.11)
RDW: 17.4 % — ABNORMAL HIGH (ref 11.5–15.5)
WBC: 9.2 10*3/uL (ref 4.0–10.5)

## 2013-12-24 LAB — URINALYSIS, ROUTINE W REFLEX MICROSCOPIC
Bilirubin Urine: NEGATIVE
Glucose, UA: NEGATIVE mg/dL
Hgb urine dipstick: NEGATIVE
KETONES UR: NEGATIVE mg/dL
NITRITE: NEGATIVE
PH: 7.5 (ref 5.0–8.0)
Protein, ur: NEGATIVE mg/dL
Specific Gravity, Urine: 1.004 — ABNORMAL LOW (ref 1.005–1.030)
Urobilinogen, UA: 0.2 mg/dL (ref 0.0–1.0)

## 2013-12-24 LAB — COMPREHENSIVE METABOLIC PANEL
ALK PHOS: 55 U/L (ref 39–117)
ALT: 21 U/L (ref 0–35)
AST: 30 U/L (ref 0–37)
Albumin: 3.1 g/dL — ABNORMAL LOW (ref 3.5–5.2)
BUN: 10 mg/dL (ref 6–23)
CALCIUM: 9.5 mg/dL (ref 8.4–10.5)
CO2: 21 mEq/L (ref 19–32)
Chloride: 99 mEq/L (ref 96–112)
Creatinine, Ser: 0.74 mg/dL (ref 0.50–1.10)
GFR calc Af Amer: >90 mL/min (ref >90)
GFR calc non Af Amer: 78 mL/min — ABNORMAL LOW (ref >90)
Glucose, Bld: 109 mg/dL — ABNORMAL HIGH (ref 70–99)
Potassium: 3 mEq/L — ABNORMAL LOW (ref 3.7–5.3)
SODIUM: 137 meq/L (ref 137–147)
TOTAL PROTEIN: 7 g/dL (ref 6.0–8.3)
Total Bilirubin: 0.4 mg/dL (ref 0.3–1.2)

## 2013-12-24 LAB — POCT I-STAT TROPONIN I: Troponin i, poc: 0.01 ng/mL (ref 0.00–0.08)

## 2013-12-24 LAB — URINE MICROSCOPIC-ADD ON

## 2013-12-24 LAB — TROPONIN I: Troponin I: <0.3 ng/mL (ref <0.30)

## 2013-12-24 MED ORDER — LORAZEPAM 2 MG/ML IJ SOLN
1.0000 mg | Freq: Once | INTRAMUSCULAR | Status: AC
  Administered 2013-12-25: 1 mg via INTRAVENOUS
  Filled 2013-12-24: qty 1

## 2013-12-24 MED ORDER — TECHNETIUM TC 99M DIETHYLENETRIAME-PENTAACETIC ACID: 40.0000 | Freq: Once | INTRAVENOUS | Status: AC | PRN

## 2013-12-24 MED ORDER — ACETAMINOPHEN 325 MG PO TABS
650.0000 mg | ORAL_TABLET | Freq: Once | ORAL | Status: AC
  Administered 2013-12-24: 650 mg via ORAL
  Filled 2013-12-24: qty 2

## 2013-12-24 MED ORDER — POTASSIUM CHLORIDE 20 MEQ/15ML (10%) PO LIQD
40.0000 meq | Freq: Once | ORAL | Status: AC
  Administered 2013-12-24: 40 meq via ORAL
  Filled 2013-12-24: qty 30

## 2013-12-24 MED ORDER — SODIUM CHLORIDE 0.9 % IV BOLUS (SEPSIS)
500.0000 mL | Freq: Once | INTRAVENOUS | Status: AC
  Administered 2013-12-24: 500 mL via INTRAVENOUS

## 2013-12-24 MED ORDER — TECHNETIUM TO 99M ALBUMIN AGGREGATED
4.8000 | Freq: Once | INTRAVENOUS | Status: AC | PRN
  Administered 2013-12-24: 4.8 via INTRAVENOUS

## 2013-12-24 NOTE — ED Notes (Signed)
Bed: WA07 Expected date: 12/24/13 Expected time: 6:03 PM Means of arrival: Ambulance Comments: EMS

## 2013-12-24 NOTE — ED Notes (Signed)
PER- Pt picked up from home with c/o sob and weakness x1 day.  EMS reports pt had anxiety issues this morning and reports taking xanax.  Denies any pain.  Pt also c/o diarrhea.  HX of rectal cancer, last treatment of chemo x3 weeks.  Pt reports not going to last treatment due to feeling week.  Daughter reports pt temp has been ranging 94-22F orally.  Pt alert and oriented per baseline. Pt appears anxious.

## 2013-12-24 NOTE — ED Provider Notes (Signed)
TIME SEEN: 6:35 PM  CHIEF COMPLAINT: Shortness of breath, weakness  HPI: Patient is a 80 year old female with a history of hypertension, hyperlipidemia, hypothyroidism, rectal cancer who has undergone chemotherapy and radiation (last chemotherapy 6 weeks ago, last radiation 3 weeks ago) who presents the emergency department with sudden onset shortness of breath that started 8:30 this morning while at rest. She states she's also felt very weak. She denies any chest pain. No fevers or cough. No vomiting. She has had diarrhea recently but this is improving. No bloody stool or melena. She has never had similar symptoms. No prior history of PE or DVT. No lower extremity swelling but has had aching in bilateral legs. Patient reports that she feels much better after being on oxygen in the ED. She does not wear oxygen at home. Denies any aggravating factors.  ROS: See HPI Constitutional: no fever  Eyes: no drainage  ENT: no runny nose   Cardiovascular:  no chest pain  Resp:  SOB  GI: no vomiting GU: no dysuria Integumentary: no rash  Allergy: no hives  Musculoskeletal: no leg swelling  Neurological: no slurred speech ROS otherwise negative  PAST MEDICAL HISTORY/PAST SURGICAL HISTORY:  Past Medical History  Diagnosis Date  . PONV (postoperative nausea and vomiting)   . Hypertension   . Hyperlipemia   . Hypothyroidism   . GERD (gastroesophageal reflux disease)   . Arthritis   . Depression   . Deaf, left   . Colon cancer 08/31/13    invasive squamous cell  . Cancer     skin  . Skin cancer     Shingles 2012 Bad case    MEDICATIONS:  Prior to Admission medications   Medication Sig Start Date End Date Taking? Authorizing Provider  ALPRAZolam Duanne Moron) 0.25 MG tablet TAKE 1 TABLET BY MOUTH 3 TIMES A DAY 11/19/13   Marye Round, MD  Alum & Mag Hydroxide-Simeth (MAGIC MOUTHWASH W/LIDOCAINE) SOLN Take 10 mLs by mouth 4 (four) times daily as needed for mouth pain. 10/27/13   Venetia Maxon Rama,  MD  estrogens, conjugated, (PREMARIN) 0.625 MG tablet Take 0.625 mg by mouth daily.     Historical Provider, MD  FLUoxetine (PROZAC) 40 MG capsule Take 40 mg by mouth every morning.     Historical Provider, MD  HYDROcodone-acetaminophen (NORCO/VICODIN) 5-325 MG per tablet Take 1 tablet by mouth every 4 (four) hours as needed for pain. 10/10/13   Ladell Pier, MD  levothyroxine (SYNTHROID, LEVOTHROID) 100 MCG tablet Take 100 mcg by mouth every morning.     Historical Provider, MD  lidocaine (XYLOCAINE) 5 % ointment Apply topically as needed. 2/70/35   Leighton Ruff, MD  loperamide (IMODIUM) 2 MG capsule TAKE 1 TO 2 CAPSULES AS NEEDED FOR DIARRHEA OR LOOSE STOOLS **MA OF 8 CAPS PER DAY 11/10/13   Ladell Pier, MD  Multiple Vitamin (MULTIVITAMIN WITH MINERALS) TABS tablet Take 1 tablet by mouth daily. 10/27/13   Christina P Rama, MD  ondansetron (ZOFRAN) 8 MG tablet Take 1 tablet (8 mg total) by mouth every 8 (eight) hours as needed for nausea. 09/28/13   Ladell Pier, MD  trolamine salicylate (ASPERCREME) 10 % cream Apply 1 application topically as needed (pain).    Historical Provider, MD  zolpidem (AMBIEN) 5 MG tablet Take 1 tablet (5 mg total) by mouth at bedtime as needed for sleep. 10/27/13   Venetia Maxon Rama, MD    ALLERGIES:  Allergies  Allergen Reactions  . Morphine And Related  Hives    SOCIAL HISTORY:  History  Substance Use Topics  . Smoking status: Never Smoker   . Smokeless tobacco: Never Used  . Alcohol Use: No    FAMILY HISTORY: Family History  Problem Relation Age of Onset  . Kidney disease Mother   . Heart attack Father   . Heart disease Father   . Cancer Sister     breast  . Cancer Daughter     breast  . Cancer Sister     ovarian  . Cancer Sister     melanoma    EXAM: SpO2 99% CONSTITUTIONAL: Alert and oriented and responds appropriately to questions. Well-appearing; well-nourished HEAD: Normocephalic EYES: Conjunctivae clear, PERRL ENT: normal  nose; no rhinorrhea; moist mucous membranes; pharynx without lesions noted NECK: Supple, no meningismus, no LAD  CARD: RRR; S1 and S2 appreciated; no murmurs, no clicks, no rubs, no gallops RESP: Normal chest excursion without splinting or tachypnea; breath sounds clear and equal bilaterally; no wheezes, no rhonchi, no rales,  ABD/GI: Normal bowel sounds; non-distended; soft, non-tender, no rebound, no guarding BACK:  The back appears normal and is non-tender to palpation, there is no CVA tenderness EXT: Normal ROM in all joints; non-tender to palpation; no edema; normal capillary refill; no cyanosis    SKIN: Normal color for age and race; warm NEURO: Moves all extremities equally PSYCH: The patient's mood and manner are appropriate. Grooming and personal hygiene are appropriate.  MEDICAL DECISION MAKING: Patient with sudden onset shortness of breath. Her vital signs are stable. She has no hypoxia. Her lungs are clear. Her chest x-ray is clear. Troponin is negative. Her EKG shows no significant changes. Hemoglobin is 10.6 which is her baseline. Her potassium is slightly low at 3.0. Will replace. She states she feels better on oxygen in the ED. Will check CT chest to rule out pulmonary embolus given her history of cancer. If patient remains dyspneic, may need admission.  ED PROGRESS: Unable to obtain appropriate peripheral IV access for CT chest imaging. I have attempted multiple times using ultrasound the patient has very small venous access and placing a 20-gauge causes PIV to blow.  Discuss with CT technician. Patient will not be able to receive a CT scan which she has a 20-gauge IV. Given we are unable to obtain this IV access, will obtain VQ scan.  10:59 PM  Pt becoming tachypneic again. She reports feeling very anxious about "what's going to happen to me".  She denies having any chest pain or shortness of breath. Her oxygen saturation is 100% on room air. Her lungs are clear to auscultation.  Patient is going to VQ scan currently. Will place back on oxygen for symptom control.  12:07 PM  Spoke with radiologist. Patient VQ scan shows no mismatch. Low probability for pulmonary embolus. Patient reports she feels worse and is having chest pain and shortness of breath. Her lungs are still clear and she is satting 100% on room air but she is tachypneic. Have given Ativan with no relief of symptoms. Will admit for shortness of breath and chest pain.  Will repeat EKG, troponin.  Will give ASA.   12:27 PM  Repeat EKG shows no new ischemic changes. Discussed with hospitalist for admission for ACS rule out.  EKG Interpretation    Date/Time:  Saturday December 24 2013 18:30:59 EST Ventricular Rate:  68 PR Interval:  147 QRS Duration: 90 QT Interval:  508 QTC Calculation: 540 R Axis:   25 Text Interpretation:  Sinus rhythm Probable anterior infarct, old Prolonged QT interval No significant change since last tracing Confirmed by Da Authement  DO, Giovany Cosby ([id number]) on 12/24/2013 6:40:21 PM             [place], DO 12/25/13 [id number]

## 2013-12-25 ENCOUNTER — Encounter (HOSPITAL_COMMUNITY): Payer: Self-pay | Admitting: Nurse Practitioner

## 2013-12-25 DIAGNOSIS — R0600 Dyspnea, unspecified: Secondary | ICD-10-CM | POA: Diagnosis present

## 2013-12-25 DIAGNOSIS — R079 Chest pain, unspecified: Secondary | ICD-10-CM | POA: Diagnosis not present

## 2013-12-25 DIAGNOSIS — F411 Generalized anxiety disorder: Secondary | ICD-10-CM | POA: Diagnosis not present

## 2013-12-25 DIAGNOSIS — E43 Unspecified severe protein-calorie malnutrition: Secondary | ICD-10-CM

## 2013-12-25 DIAGNOSIS — R0989 Other specified symptoms and signs involving the circulatory and respiratory systems: Principal | ICD-10-CM

## 2013-12-25 DIAGNOSIS — R0609 Other forms of dyspnea: Principal | ICD-10-CM

## 2013-12-25 DIAGNOSIS — C210 Malignant neoplasm of anus, unspecified: Secondary | ICD-10-CM

## 2013-12-25 DIAGNOSIS — G2581 Restless legs syndrome: Secondary | ICD-10-CM

## 2013-12-25 HISTORY — DX: Chest pain, unspecified: R07.9

## 2013-12-25 LAB — TROPONIN I
Troponin I: <0.3 ng/mL (ref <0.30)
Troponin I: <0.3 ng/mL (ref <0.30)

## 2013-12-25 LAB — POCT I-STAT TROPONIN I: Troponin i, poc: 0.01 ng/mL (ref 0.00–0.08)

## 2013-12-25 MED ORDER — ONDANSETRON HCL 4 MG PO TABS: 4.0000 mg | ORAL_TABLET | Freq: Four times a day (QID) | ORAL | Status: DC | PRN

## 2013-12-25 MED ORDER — MORPHINE SULFATE 2 MG/ML IJ SOLN: 1.0000 mg | INTRAMUSCULAR | Status: DC | PRN

## 2013-12-25 MED ORDER — HEPARIN SODIUM (PORCINE) 5000 UNIT/ML IJ SOLN
5000.0000 [IU] | Freq: Three times a day (TID) | INTRAMUSCULAR | Status: DC
Start: 2013-12-25 — End: 2013-12-25
  Administered 2013-12-25: 5000 [IU] via SUBCUTANEOUS
  Filled 2013-12-25 (×5): qty 1

## 2013-12-25 MED ORDER — POTASSIUM CHLORIDE CRYS ER 20 MEQ PO TBCR
20.0000 meq | EXTENDED_RELEASE_TABLET | Freq: Two times a day (BID) | ORAL | Status: DC
  Filled 2013-12-25 (×2): qty 1

## 2013-12-25 MED ORDER — SODIUM CHLORIDE 0.9 % IJ SOLN
3.0000 mL | Freq: Two times a day (BID) | INTRAMUSCULAR | Status: DC
  Administered 2013-12-25 (×2): 3 mL via INTRAVENOUS

## 2013-12-25 MED ORDER — MAGIC MOUTHWASH W/LIDOCAINE
10.0000 mL | Freq: Four times a day (QID) | ORAL | Status: DC | PRN
  Filled 2013-12-25: qty 10

## 2013-12-25 MED ORDER — ACETAMINOPHEN 650 MG RE SUPP: 650.0000 mg | Freq: Four times a day (QID) | RECTAL | Status: DC | PRN

## 2013-12-25 MED ORDER — ESTROGENS CONJUGATED 0.625 MG PO TABS
0.6250 mg | ORAL_TABLET | Freq: Every day | ORAL | Status: DC
  Administered 2013-12-25: 0.625 mg via ORAL
  Filled 2013-12-25: qty 1

## 2013-12-25 MED ORDER — ALPRAZOLAM 0.25 MG PO TABS: 0.2500 mg | ORAL_TABLET | Freq: Three times a day (TID) | ORAL | Status: DC | PRN

## 2013-12-25 MED ORDER — POTASSIUM CHLORIDE CRYS ER 20 MEQ PO TBCR: 20.0000 meq | EXTENDED_RELEASE_TABLET | Freq: Every day | ORAL | Status: DC

## 2013-12-25 MED ORDER — ACETAMINOPHEN 325 MG PO TABS: 650.0000 mg | ORAL_TABLET | Freq: Four times a day (QID) | ORAL | Status: DC | PRN

## 2013-12-25 MED ORDER — HYDROCODONE-ACETAMINOPHEN 5-325 MG PO TABS: 1.0000 | ORAL_TABLET | ORAL | Status: DC | PRN

## 2013-12-25 MED ORDER — POTASSIUM CHLORIDE CRYS ER 20 MEQ PO TBCR
40.0000 meq | EXTENDED_RELEASE_TABLET | Freq: Once | ORAL | Status: AC
  Administered 2013-12-25: 40 meq via ORAL
  Filled 2013-12-25: qty 2

## 2013-12-25 MED ORDER — ASPIRIN 81 MG PO CHEW
324.0000 mg | CHEWABLE_TABLET | Freq: Once | ORAL | Status: AC
  Administered 2013-12-25: 324 mg via ORAL
  Filled 2013-12-25: qty 4

## 2013-12-25 MED ORDER — ADULT MULTIVITAMIN W/MINERALS CH
1.0000 | ORAL_TABLET | Freq: Every day | ORAL | Status: DC
Start: 2013-12-25 — End: 2013-12-25
  Administered 2013-12-25: 10:00:00 1 via ORAL
  Filled 2013-12-25: qty 1

## 2013-12-25 MED ORDER — FLUOXETINE HCL 20 MG PO CAPS
40.0000 mg | ORAL_CAPSULE | Freq: Every morning | ORAL | Status: DC
  Administered 2013-12-25: 40 mg via ORAL
  Filled 2013-12-25: qty 2

## 2013-12-25 MED ORDER — PRAMIPEXOLE DIHYDROCHLORIDE 0.125 MG PO TABS
0.1250 mg | ORAL_TABLET | Freq: Every day | ORAL | Status: DC
Start: 2013-12-25 — End: 2015-01-25

## 2013-12-25 MED ORDER — ZOLPIDEM TARTRATE 5 MG PO TABS
5.0000 mg | ORAL_TABLET | Freq: Every evening | ORAL | Status: DC | PRN
  Administered 2013-12-25: 5 mg via ORAL
  Filled 2013-12-25: qty 1

## 2013-12-25 MED ORDER — LEVOTHYROXINE SODIUM 88 MCG PO TABS
88.0000 ug | ORAL_TABLET | Freq: Every day | ORAL | Status: DC
  Administered 2013-12-25: 08:00:00 88 ug via ORAL
  Filled 2013-12-25 (×2): qty 1

## 2013-12-25 MED ORDER — SUCRALFATE 1 GM/10ML PO SUSP
0.5000 g | Freq: Three times a day (TID) | ORAL | Status: DC
Start: 2013-12-25 — End: 2013-12-25
  Administered 2013-12-25 (×2): 0.5 g via ORAL
  Filled 2013-12-25 (×5): qty 10

## 2013-12-25 MED ORDER — ONDANSETRON HCL 4 MG/2ML IJ SOLN: 4.0000 mg | Freq: Four times a day (QID) | INTRAMUSCULAR | Status: DC | PRN

## 2013-12-25 NOTE — Progress Notes (Signed)
Pt received from ED. VSS. No distress noted. Assessment complete. Pt remained calm throughout admission. Pt requested Ambien 5 mg PO for sleep. Tolerated well. No distress noted. Will continue to monitor.

## 2013-12-25 NOTE — H&P (Signed)
[facility]                                                                                    Patient Demographics  Cindy [name], is a 80 y.o. female  CSN: [id number]  MRN: [id number]  DOB - [birth year]  Admit Date - 12/24/2013  Outpatient Primary MD for the patient is Gerrit Heck, MD   With History of -  Past Medical History  Diagnosis Date  . PONV (postoperative nausea and vomiting)   . Hypertension   . Hyperlipemia   . Hypothyroidism   . GERD (gastroesophageal reflux disease)   . Arthritis   . Depression   . Deaf, left   . Colon cancer 08/31/13    invasive squamous cell  . Cancer     skin  . Skin cancer     Shingles 2012 Bad case      Past Surgical History  Procedure Laterality Date  . Abdominal hysterectomy    . Cholecystectomy    . Tonsillectomy    . Appendectomy    . Thyroidectomy, partial    . Ercp    . Colonoscopy  08/31/13    invasive squamous cell colon  . Eye surgery      cataracts  . Knee arthroscopy  05/11/2012    Procedure: ARTHROSCOPY KNEE;  Surgeon: Hessie Dibble, MD;  Location: [facility];  Service: Orthopedics;  Laterality: Right;  left knee medial menisectomy and chondroplasty  . Esophagogastroduodenoscopy  10/20/2012    Procedure: ESOPHAGOGASTRODUODENOSCOPY (EGD);  Surgeon: Arta Silence, MD;  Location: [facility];  Service: Endoscopy;  Laterality: Left;  . Cesarean section      2    in for   Chief Complaint  Patient presents with  . Weakness  . Shortness of Breath     HPI  Cindy [name]  is a 80 y.o. female, with past medical history significant for hypertension, hyperlipidemia and rectal cancer on chemotherapy, presenting with one-day history of SOB , recurrent, however no chest pains, nausea, vomiting or cold sweats. Patient denies any fever but had some chills, no abdominal pain no diarrhea    Review of Systems    In addition to the HPI above,  5 chills chills, No Headache,  No changes with Vision or hearing, No problems swallowing food or Liquids, No Chest pain, Cough or Shortness of Breath, No Abdominal pain, No Nausea or Vommitting, Bowel movements are regular, No Blood in stool or Urine, No dysuria, No new skin rashes or bruises, No new joints pains-aches,  No new weakness, tingling, numbness in any extremity, No recent weight gain or loss, No polyuria, polydypsia or polyphagia, No significant Mental Stressors.  A full 10 point Review of Systems was done, except as stated above, all other Review of Systems were negative.   Social History History  Substance Use Topics  . Smoking status: Never Smoker   . Smokeless tobacco: Never Used  . Alcohol Use: No     Family History Family History  Problem Relation Age of Onset  . Kidney disease Mother   . Heart attack Father   . Heart disease Father   .  Cancer Sister     breast  . Cancer Daughter     breast  . Cancer Sister     ovarian  . Cancer Sister     melanoma     Prior to Admission medications   Medication Sig Start Date End Date Taking? Authorizing Provider  ALPRAZolam (XANAX) 0.25 MG tablet Take 0.25 mg by mouth 3 (three) times daily as needed for anxiety.   Yes Historical Provider, MD  Alum & Mag Hydroxide-Simeth (MAGIC MOUTHWASH W/LIDOCAINE) SOLN Take 10 mLs by mouth 4 (four) times daily as needed for mouth pain. 10/27/13  Yes Christina P Rama, MD  estrogens, conjugated, (PREMARIN) 0.625 MG tablet Take 0.625 mg by mouth daily.    Yes Historical Provider, MD  FLUoxetine (PROZAC) 40 MG capsule Take 40 mg by mouth every morning.    Yes Historical Provider, MD  levothyroxine (SYNTHROID, LEVOTHROID) 88 MCG tablet Take 88 mcg by mouth daily before breakfast.   Yes Historical Provider, MD  Multiple Vitamin (MULTIVITAMIN WITH MINERALS) TABS tablet Take 1 tablet by mouth daily. 10/27/13  Yes Christina P Rama, MD  sucralfate (CARAFATE) 1 GM/10ML suspension Take 0.5 g by mouth 4 (four) times daily -   with meals and at bedtime.   Yes Historical Provider, MD    Allergies  Allergen Reactions  . Morphine And Related Hives    Physical Exam  Vitals  Blood pressure 132/63, pulse 80, temperature 98.1 F (36.7 C), temperature source Oral, resp. rate 22, SpO2 98.00%.   1. General elderly female looks chronically ill very pleasant  2. Normal affect and insight, Not Suicidal or Homicidal, Awake Alert, Oriented X 3.  3. No F.N deficits, ALL C.Nerves Intact, Strength 5/5 all 4 extremities, Sensation intact all 4 extremities, Plantars down going.  4. Ears and Eyes appear Normal, Conjunctivae pale, PERRLA. Moist Oral Mucosa.  5. Supple Neck, No JVD, No cervical lymphadenopathy appriciated, No Carotid Bruits.  6. Symmetrical Chest wall movement, Good air movement bilaterally, CTAB.  7. RRR, No Gallops, Rubs or Murmurs, No Parasternal Heave.  8. Positive Bowel Sounds, Abdomen Soft, Non tender, No organomegaly appriciated,No rebound -guarding or rigidity.  9.  No Cyanosis, Normal Skin Turgor, No Skin Rash or Bruise.  10. Good muscle tone,  joints appear normal , no effusions, Normal ROM.  11. No Palpable Lymph Nodes in Neck or Axillae    Data Review  CBC  Recent Labs Lab 12/24/13 1849  WBC 9.2  HGB 10.6*  HCT 31.5*  PLT 351  MCV 96.0  MCH 32.3  MCHC 33.7  RDW 17.4*  LYMPHSABS 4.9*  MONOABS 0.6  EOSABS 0.6  BASOSABS 0.0   ------------------------------------------------------------------------------------------------------------------  Chemistries   Recent Labs Lab 12/24/13 1849  NA 137  K 3.0*  CL 99  CO2 21  GLUCOSE 109*  BUN 10  CREATININE 0.74  CALCIUM 9.5  AST 30  ALT 21  ALKPHOS 55  BILITOT 0.4   ------------------------------------------------------------------------------------------------------------------ Cardiac Enzymes  Recent Labs Lab 12/24/13 1849  TROPONINI <0.30    ------------------------------------------------------------------------------------------------------------------ No components found with this basename: POCBNP,    ---------------------------------------------------------------------------------------------------------------  Urinalysis    Component Value Date/Time   COLORURINE YELLOW 12/24/2013 1938   APPEARANCEUR CLEAR 12/24/2013 1938   LABSPEC 1.004* 12/24/2013 1938   LABSPEC 1.020 12/02/2013 1329   PHURINE 7.5 12/24/2013 1938   GLUCOSEU NEGATIVE 12/24/2013 1938   GLUCOSEU Negative 12/02/2013 1329   HGBUR NEGATIVE 12/24/2013 1938   BILIRUBINUR NEGATIVE 12/24/2013 1938   KETONESUR NEGATIVE 12/24/2013 1938  PROTEINUR NEGATIVE 12/24/2013 1938   UROBILINOGEN 0.2 12/24/2013 1938   UROBILINOGEN 0.2 12/02/2013 1329   NITRITE NEGATIVE 12/24/2013 1938   LEUKOCYTESUR SMALL* 12/24/2013 1938    ----------------------------------------------------------------------------------------------------------------     Imaging results:   Dg Chest 2 View  12/24/2013   CLINICAL DATA:  Weakness and shortness of breath.  EXAM: CHEST  2 VIEW  COMPARISON:  10/23/2013  FINDINGS: The lungs are adequately inflated without consolidation or effusion. There is stable borderline cardiomegaly. Remainder of the exam is unchanged.  IMPRESSION: No active cardiopulmonary disease.   Electronically Signed   By: Marin Olp M.D.   On: 12/24/2013 19:01   Nm Pulmonary Perf And Vent  12/25/2013   CLINICAL DATA:  Shortness of breath. Evaluate for pulmonary embolism  EXAM: NUCLEAR MEDICINE VENTILATION - PERFUSION LUNG SCAN  TECHNIQUE: Ventilation images were obtained in multiple projections using inhaled aerosol technetium 99 M DTPA. Perfusion images were obtained in multiple projections after intravenous injection of Tc-54m MAA.  COMPARISON:  Chest x-ray from the same day  RADIOPHARMACEUTICALS:  40 mCi Tc-26m DTPA aerosol and 4.2mCi Tc-6m MAA  FINDINGS: Ventilation: No focal ventilation  defect. Preferential deposition of radiotracer in the left central airways, of uncertain clinical significance. There is good aeration of the right lung as well.  Perfusion: No wedge shaped peripheral perfusion defects to suggest acute pulmonary embolism.  Radiotracer seen in the stomach and the level of the neck. No renal tracer seen.  IMPRESSION: Normal lung perfusion.  No evidence for pulmonary embolism.   Electronically Signed   By: Jorje Guild M.D.   On: 12/25/2013 00:16    My personal review of EKG: Rhythm NSR,   Assessment & Plan  1. shortness of breath;? Angina equivalent, workup in the ER was negative including a VQ scan, chest x-ray, EKG and troponin. Looks more like anxiety 2. Hypertension 3. Hyperlipidemia 4. Hypothyroidism 5. GERD 6. History of rectal cancer on chemotherapy last episode 3 weeks ago  Plan Serial troponins Continue with Xanax Continue home medications   DVT Prophylaxis heparin  AM Labs Ordered, also please review Full Orders  Family Communication: Admission, patients condition and plan of care including tests being ordered have been discussed with the patient and daughter who indicate understanding and agree with the plan and Code Status.  Code Status full  Disposition Plan: Home  Time spent in minutes : 32 minutes  Condition fair

## 2013-12-25 NOTE — Discharge Summary (Addendum)
Physician Discharge Summary  Cindy [name] IRC:[id number] DOB: Nov 08, 1930 DOA: 12/24/2013  PCP: Gerrit Heck, MD  Admit date: 12/24/2013 Discharge date: 12/25/2013  Recommendations for Outpatient Follow-up:  1. Home health physical therapy/RN setup. 2. Discharged on potassium supplementation. Recommend repeat potassium in one week. 3. Reassess patient's complaints of restless legs on Mirapex and consider dose titration if indicated.  Discharge Diagnoses:  Principal Problem:    Dyspnea Active Problems:    Weakness    Hypothyroidism    Anxiety    Hypertension    GERD (gastroesophageal reflux disease)    Anal cancer    Colon cancer    Protein-calorie malnutrition, severe    Restless leg syndrome    Hypokalemia     Discharge Condition: Improved.  Diet recommendation: Regular.  History of present illness:  Cindy [name] is an 80 y.o. female with a PMH of HTN, HLD, rectal cancer status post XRT (completed 11/23/13) and 1 cycle of chemo with 5-FU/mitomycin (refused 2nd cycle), hypothyroidism, recent hospitalization 10/23/13-10/27/13 with febrile neutropenia, who was admitted on 12/25/13 with a chief complaint of weakness and shortness of breath. Chest x-ray negative for acute cardiopulmonary disease. VQ scan negative for pulmonary embolism.   Hospital Course by problem:  Discharge Diagnoses:  Principal Problem:   Dyspnea Chest x-ray negative for cardiopulmonary process. VQ scan negative for pulmonary embolism. Troponins negative.  EKG showed sinus rhythm, no change from prior. Symptoms resolved. Appeared to be consistent with anxiety/panic attack. Given instruction on pursed lip breathing, and taking Xanax to help with anxiety symptoms. Active Problems:   Hypokalemia Supplemented. We'll send home on routine supplementation.   Restless leg syndrome Trial Mirapex. Followup with PCP to ensure effectiveness.   Weakness  Receiving physical therapy in her home.  Resume.   Hypothyroidism Continue home dose of Synthroid.   Anxiety Continue Xanax 0.25 mg 3 times a day when necessary and Prozac.   Hypertension Not on any antihypertensives. Blood pressure stable.   GERD (gastroesophageal reflux disease) Continue Carafate.   Colon/Anal cancer Followup with Dr. Benay Spice as directed.   Protein-calorie malnutrition, severe Continue to encourage good nutritional intake.   Discharge Exam: Filed Vitals:   12/25/13 0600  BP: 132/61  Pulse: 56  Temp: 98 F (36.7 C)  Resp: 18   Filed Vitals:   12/24/13 2357 12/25/13 0054 12/25/13 0116 12/25/13 0600  BP: 132/63 136/69 169/74 132/61  Pulse: 80 70 74 56  Temp:  97.6 F (36.4 C) 98.1 F (36.7 C) 98 F (36.7 C)  TempSrc:  Oral Oral Oral  Resp: 22 22 18 18   Height:   5\' 5"  (1.651 m)   Weight:   61.326 kg (135 lb 3.2 oz)   SpO2: 98% 98% 95% 96%    Gen:  NAD Cardiovascular:  RRR, No M/R/G Respiratory: Lungs CTAB Gastrointestinal: Abdomen soft, NT/ND with normal active bowel sounds. Extremities: No C/E/C   Discharge Instructions      Discharge Orders   Future Appointments Provider Department Dept Phone   12/29/2013 11:45 AM Marye Round, [place] [phone]   08/14/2352 6:14 AM Leighton Ruff, MD [facility], [place]   02/24/2014 12:15 PM Owens Shark, NP [facility] [phone]   Future Orders Complete By Expires   Call MD for:  As directed    Scheduling Instructions:     Uncontrolled anxiety, shortness of breath accompanied by fever, chills, cough, chest pain, or severe symptoms uncontrolled  with Xanax.   Diet general  As directed    Discharge instructions  As directed    Comments:     Followup with her doctor in one week to determine if your symptoms are better controlled with your new medicines.   Face-to-face encounter (required for Medicare/Medicaid patients)  As directed    Comments:      I Hermela Hardt certify that this patient is under my care and that I, or a nurse practitioner or physician's assistant working with me, had a face-to-face encounter that meets the physician face-to-face encounter requirements with this patient on 12/25/2013. The encounter with the patient was in whole, or in part for the following medical condition(s) which is the primary reason for home health care (List medical condition): Recently diagnosed rectal CA, very anxious, needs RN to teach symptom management for anxiety control such as deep breathing exercises, assessment of medications and how she is using them to facilitate treating her underlying anxiety. Skilled assessment to ensure no physiologic cause for her dyspnea. Physical therapy for ongoing exercises to improve strength/balance. Patient lives alone. High risk for rehospitalization.   Questions:     The encounter with the patient was in whole, or in part, for the following medical condition, which is the primary reason for home health care:  Anxiety, lives alone, needs teaching regarding symptom managment.   I certify that, based on my findings, the following services are medically necessary home health services:  Nursing   Physical therapy   My clinical findings support the need for the above services:  Unable to leave home safely without assistance and/or assistive device   Further, I certify that my clinical findings support that this patient is homebound due to:  Unable to leave home safely without assistance   Reason for Medically Necessary Home Health Services:  Skilled Nursing- Teaching of Disease Process/Symptom Management   Skilled Nursing- Skilled Assessment/Observation   Skilled Nursing- Changes in Medication/Medication Management   Therapy- Therapeutic Exercises to Increase Strength and Endurance   Home Health  As directed    Questions:     To provide the following care/treatments:  PT   RN   Increase activity slowly  As directed         Medication List         ALPRAZolam 0.25 MG tablet  Commonly known as:  XANAX  Take 0.25 mg by mouth 3 (three) times daily as needed for anxiety.     estrogens (conjugated) 0.625 MG tablet  Commonly known as:  PREMARIN  Take 0.625 mg by mouth daily.     FLUoxetine 40 MG capsule  Commonly known as:  PROZAC  Take 40 mg by mouth every morning.     levothyroxine 88 MCG tablet  Commonly known as:  SYNTHROID, LEVOTHROID  Take 88 mcg by mouth daily before breakfast.     magic mouthwash w/lidocaine Soln  Take 10 mLs by mouth 4 (four) times daily as needed for mouth pain.     multivitamin with minerals Tabs tablet  Take 1 tablet by mouth daily.     potassium chloride SA 20 MEQ tablet  Commonly known as:  K-DUR,KLOR-CON  Take 1 tablet (20 mEq total) by mouth daily.     pramipexole 0.125 MG tablet  Commonly known as:  MIRAPEX  Take 1 tablet (0.125 mg total) by mouth at bedtime.     sucralfate 1 GM/10ML suspension  Commonly known as:  CARAFATE  Take 0.5 g by mouth 4 (four)  times daily -  with meals and at bedtime.       Follow-up Information   Follow up with Gerrit Heck, MD. Schedule an appointment as soon as possible for a visit in 1 week.   Specialty:  Family Medicine   Contact information:   [place] [id number] [phone]        The results of significant diagnostics from this hospitalization (including imaging, microbiology, ancillary and laboratory) are listed below for reference.    Significant Diagnostic Studies: Dg Chest 2 View  12/24/2013   CLINICAL DATA:  Weakness and shortness of breath.  EXAM: CHEST  2 VIEW  COMPARISON:  10/23/2013  FINDINGS: The lungs are adequately inflated without consolidation or effusion. There is stable borderline cardiomegaly. Remainder of the exam is unchanged.  IMPRESSION: No active cardiopulmonary disease.   Electronically Signed   By: Marin Olp M.D.   On: 12/24/2013 19:01   Nm Pulmonary  Perf And Vent  12/25/2013   CLINICAL DATA:  Shortness of breath. Evaluate for pulmonary embolism  EXAM: NUCLEAR MEDICINE VENTILATION - PERFUSION LUNG SCAN  TECHNIQUE: Ventilation images were obtained in multiple projections using inhaled aerosol technetium 99 M DTPA. Perfusion images were obtained in multiple projections after intravenous injection of Tc-72m MAA.  COMPARISON:  Chest x-ray from the same day  RADIOPHARMACEUTICALS:  40 mCi Tc-60m DTPA aerosol and 4.46mCi Tc-56m MAA  FINDINGS: Ventilation: No focal ventilation defect. Preferential deposition of radiotracer in the left central airways, of uncertain clinical significance. There is good aeration of the right lung as well.  Perfusion: No wedge shaped peripheral perfusion defects to suggest acute pulmonary embolism.  Radiotracer seen in the stomach and the level of the neck. No renal tracer seen.  IMPRESSION: Normal lung perfusion.  No evidence for pulmonary embolism.   Electronically Signed   By: Jorje Guild M.D.   On: 12/25/2013 00:16    Labs:  Basic Metabolic Panel:  Recent Labs Lab 12/24/13 1849  NA 137  K 3.0*  CL 99  CO2 21  GLUCOSE 109*  BUN 10  CREATININE 0.74  CALCIUM 9.5   GFR Estimated Creatinine Clearance: 49.6 ml/min (by C-G formula based on Cr of 0.74). Liver Function Tests:  Recent Labs Lab 12/24/13 1849  AST 30  ALT 21  ALKPHOS 55  BILITOT 0.4  PROT 7.0  ALBUMIN 3.1*   CBC:  Recent Labs Lab 12/24/13 1849  WBC 9.2  NEUTROABS 3.2  HGB 10.6*  HCT 31.5*  MCV 96.0  PLT 351   Cardiac Enzymes:  Recent Labs Lab 12/24/13 1849 12/25/13 0614 12/25/13 0848  TROPONINI <0.30 <0.30 <0.30    Time coordinating discharge: 30 minutes.  Signed:  Charmin Aguiniga  Pager [phone] [facility] 12/25/2013, 12:16 PM

## 2013-12-25 NOTE — Progress Notes (Signed)
Pharmacy called regarding "morphine and related" drug allergy. Pt has prn morphine and vicodin ordered for pain. Pharmacist verbalized that she would not verify the morphine, but would verify the vicodin.

## 2013-12-25 NOTE — Care Management Utilization Note (Signed)
UR complete    Dagen Beevers,MSN,RN [phone] 

## 2013-12-25 NOTE — Progress Notes (Signed)
   CARE MANAGEMENT NOTE 12/25/2013  Patient:  Cindy [name], Cindy [name]   Account Number:  [id number]  Date Initiated:  12/25/2013  Documentation initiated by:  DAVIS,TYMEEKA  Subjective/Objective Assessment:   80 yo female admitted with chest pain     Action/Plan:   Anticipated DC Date:  12/25/2013   Anticipated DC Plan:  [place]  CM consult      [facility] Choice  Resumption Of Svcs/PTA Provider   Choice offered to / List presented to:          [facility] arranged  HH-1 RN  [place].   Status of service:  Completed, signed off Medicare Important Message given?   (If response is "NO", the following Medicare IM given date fields will be blank) Date Medicare IM given:   Date Additional Medicare IM given:    Discharge Disposition:  [place]  Per UR Regulation:  Reviewed for med. necessity/level of care/duration of stay  If discussed at [place] of Stay Meetings, dates discussed:    Comments:  12/25/2013 1520 Pt active with [facility] for [facility] PT. HH RN added. NCM notified [place] of dc home with [facility] PT and RN. Jonnie Finner RN CCM Case Mgmt phone [phone]  12/25/13 [place] [phone] UR complete

## 2013-12-25 NOTE — Discharge Instructions (Signed)
Shortness of Breath Shortness of breath means you have trouble breathing. Shortness of breath may indicate that you have a medical problem. You should seek immediate medical care for shortness of breath. CAUSES   Not enough oxygen in the air (as with high altitudes or a smoke-filled room).  Short-term (acute) lung disease, including:  Infections, such as pneumonia.  Fluid in the lungs, such as heart failure.  A blood clot in the lungs (pulmonary embolism).  Long-term (chronic) lung diseases.  Heart disease (heart attack, angina, heart failure, and others).  Low red blood cells (anemia).  Poor physical fitness. This can cause shortness of breath when you exercise.  Chest or back injuries or stiffness.  Being overweight.  Smoking.  Anxiety. This can make you feel like you are not getting enough air. DIAGNOSIS  Serious medical problems can usually be found during your physical exam. Tests may also be done to determine why you are having shortness of breath. Tests may include:  Chest X-rays.  Lung function tests.  Blood tests.  Electrocardiography.  Exercise testing.  Echocardiography.  Imaging scans. Your caregiver may not be able to find a cause for your shortness of breath after your exam. In this case, it is important to have a follow-up exam with your caregiver as directed.  TREATMENT  Treatment for shortness of breath depends on the cause of your symptoms and can vary greatly. HOME CARE INSTRUCTIONS   Do not smoke. Smoking is a common cause of shortness of breath. If you smoke, ask for help to quit.  Avoid being around chemicals or things that may bother your breathing, such as paint fumes and dust.  Rest as needed. Slowly resume your usual activities.  If medicines were prescribed, take them as directed for the full length of time directed. This includes oxygen and any inhaled medicines.  Keep all follow-up appointments as directed by your caregiver. SEEK  MEDICAL CARE IF:   Your condition does not improve in the time expected.  You have a hard time doing your normal activities even with rest.  You have any side effects or problems with the medicines prescribed.  You develop any new symptoms. SEEK IMMEDIATE MEDICAL CARE IF:   Your shortness of breath gets worse.  You feel lightheaded, faint, or develop a cough not controlled with medicines.  You start coughing up blood.  You have pain with breathing.  You have chest pain or pain in your arms, shoulders, or abdomen.  You have a fever.  You are unable to walk up stairs or exercise the way you normally do. MAKE SURE YOU:  Understand these instructions.  Will watch your condition.  Will get help right away if you are not doing well or get worse. Document Released: 09/02/2001 Document Revised: 06/08/2012 Document Reviewed: 02/23/2012 [facility] Patient Information 2014 [place].  Pursed Lip Breathing Pursed lip breathing keeps airways open longer during breathe out (exhale). This helps release trapped air from your lungs and allows fresh air to come in. Practice pursed lip breathing so you can use this method when you are feeling short of breath.  Close your mouth.  Breathe in through your nose at a normal rate.  Take a normal sized breath.  Pucker your lips as if you were going to whistle or play a flute.  Gently tighten your stomach muscles to help push the air out.  Breathe out slowly through your pursed lips.  Breathe out at least twice as long as you breathe in.  Be sure you breathe out all the air you breathed in, but do not force the air out. Example of Pursed Lip Breathing  Breathe in through your nose as you slowly count, "1-2".   Breathe out through pursed lips as you slowly count, "1-2-3-4". Try breathing in and out as shown above. If this is not enough air for you, try this:  Breathe in through your nose as you slowly count, " 1-2-3".  Breathe  out through pursed lips as you slowly count, "1-2-3-4". Document Released: 09/16/2008 Document Revised: 09/01/2012 Document Reviewed: 07/20/2013 [facility] Patient Information 2014 [place].  Anxiety and Panic Attacks Your caregiver has informed you that you are having an anxiety or panic attack. There may be many forms of this. Most of the time these attacks come suddenly and without warning. They come at any time of day, including periods of sleep, and at any time of life. They may be strong and unexplained. Although panic attacks are very scary, they are physically harmless. Sometimes the cause of your anxiety is not known. Anxiety is a protective mechanism of the body in its fight or flight mechanism. Most of these perceived danger situations are actually nonphysical situations (such as anxiety over losing a job). CAUSES  The causes of an anxiety or panic attack are many. Panic attacks may occur in otherwise healthy people given a certain set of circumstances. There may be a genetic cause for panic attacks. Some medications may also have anxiety as a side effect. SYMPTOMS  Some of the most common feelings are:  Intense terror.  Dizziness, feeling faint.  Hot and cold flashes.  Fear of going crazy.  Feelings that nothing is real.  Sweating.  Shaking.  Chest pain or a fast heartbeat (palpitations).  Smothering, choking sensations.  Feelings of impending doom and that death is near.  Tingling of extremities, this may be from over-breathing.  Altered reality (derealization).  Being detached from yourself (depersonalization). Several symptoms can be present to make up anxiety or panic attacks. DIAGNOSIS  The evaluation by your caregiver will depend on the type of symptoms you are experiencing. The diagnosis of anxiety or panic attack is made when no physical illness can be determined to be a cause of the symptoms. TREATMENT  Treatment to prevent anxiety and panic attacks  may include:  Avoidance of circumstances that cause anxiety.  Reassurance and relaxation.  Regular exercise.  Relaxation therapies, such as yoga.  Psychotherapy with a psychiatrist or therapist.  Avoidance of caffeine, alcohol and illegal drugs.  Prescribed medication. SEEK IMMEDIATE MEDICAL CARE IF:   You experience panic attack symptoms that are different than your usual symptoms.  You have any worsening or concerning symptoms. Document Released: 12/08/2005 Document Revised: 03/01/2012 Document Reviewed: 07/22/2013 [facility] Patient Information 2014 [place].

## 2013-12-27 DIAGNOSIS — D708 Other neutropenia: Secondary | ICD-10-CM | POA: Diagnosis not present

## 2013-12-27 DIAGNOSIS — C210 Malignant neoplasm of anus, unspecified: Secondary | ICD-10-CM | POA: Diagnosis not present

## 2013-12-27 DIAGNOSIS — K1231 Oral mucositis (ulcerative) due to antineoplastic therapy: Secondary | ICD-10-CM | POA: Diagnosis not present

## 2013-12-27 DIAGNOSIS — E46 Unspecified protein-calorie malnutrition: Secondary | ICD-10-CM | POA: Diagnosis not present

## 2013-12-27 DIAGNOSIS — Z5111 Encounter for antineoplastic chemotherapy: Secondary | ICD-10-CM | POA: Diagnosis not present

## 2013-12-28 ENCOUNTER — Encounter: Payer: Self-pay | Admitting: Radiation Oncology

## 2013-12-28 ENCOUNTER — Emergency Department (HOSPITAL_COMMUNITY)
Admission: EM | Admit: 2013-12-28 | Discharge: 2013-12-28 | Disposition: A | Discharge status: Home / Self Care | Payer: Medicare Other | Attending: Emergency Medicine | Admitting: Emergency Medicine

## 2013-12-28 ENCOUNTER — Emergency Department (HOSPITAL_COMMUNITY): Payer: Medicare Other

## 2013-12-28 ENCOUNTER — Encounter (HOSPITAL_COMMUNITY): Payer: Self-pay | Admitting: Emergency Medicine

## 2013-12-28 DIAGNOSIS — Z923 Personal history of irradiation: Secondary | ICD-10-CM | POA: Diagnosis not present

## 2013-12-28 DIAGNOSIS — H9190 Unspecified hearing loss, unspecified ear: Secondary | ICD-10-CM | POA: Diagnosis not present

## 2013-12-28 DIAGNOSIS — C210 Malignant neoplasm of anus, unspecified: Secondary | ICD-10-CM | POA: Diagnosis not present

## 2013-12-28 DIAGNOSIS — Z85038 Personal history of other malignant neoplasm of large intestine: Secondary | ICD-10-CM | POA: Insufficient documentation

## 2013-12-28 DIAGNOSIS — Z8739 Personal history of other diseases of the musculoskeletal system and connective tissue: Secondary | ICD-10-CM | POA: Diagnosis not present

## 2013-12-28 DIAGNOSIS — M25549 Pain in joints of unspecified hand: Secondary | ICD-10-CM | POA: Diagnosis not present

## 2013-12-28 DIAGNOSIS — S51809A Unspecified open wound of unspecified forearm, initial encounter: Secondary | ICD-10-CM | POA: Diagnosis not present

## 2013-12-28 DIAGNOSIS — E46 Unspecified protein-calorie malnutrition: Secondary | ICD-10-CM | POA: Diagnosis not present

## 2013-12-28 DIAGNOSIS — Z8619 Personal history of other infectious and parasitic diseases: Secondary | ICD-10-CM | POA: Diagnosis not present

## 2013-12-28 DIAGNOSIS — E039 Hypothyroidism, unspecified: Secondary | ICD-10-CM | POA: Insufficient documentation

## 2013-12-28 DIAGNOSIS — D708 Other neutropenia: Secondary | ICD-10-CM | POA: Diagnosis not present

## 2013-12-28 DIAGNOSIS — M25539 Pain in unspecified wrist: Secondary | ICD-10-CM | POA: Diagnosis not present

## 2013-12-28 DIAGNOSIS — M25449 Effusion, unspecified hand: Secondary | ICD-10-CM | POA: Insufficient documentation

## 2013-12-28 DIAGNOSIS — F329 Major depressive disorder, single episode, unspecified: Secondary | ICD-10-CM | POA: Insufficient documentation

## 2013-12-28 DIAGNOSIS — M25441 Effusion, right hand: Secondary | ICD-10-CM

## 2013-12-28 DIAGNOSIS — K1231 Oral mucositis (ulcerative) due to antineoplastic therapy: Secondary | ICD-10-CM | POA: Diagnosis not present

## 2013-12-28 DIAGNOSIS — Z9221 Personal history of antineoplastic chemotherapy: Secondary | ICD-10-CM | POA: Diagnosis not present

## 2013-12-28 DIAGNOSIS — Z5111 Encounter for antineoplastic chemotherapy: Secondary | ICD-10-CM | POA: Diagnosis not present

## 2013-12-28 DIAGNOSIS — Z79899 Other long term (current) drug therapy: Secondary | ICD-10-CM | POA: Insufficient documentation

## 2013-12-28 DIAGNOSIS — I10 Essential (primary) hypertension: Secondary | ICD-10-CM | POA: Diagnosis not present

## 2013-12-28 DIAGNOSIS — R6889 Other general symptoms and signs: Secondary | ICD-10-CM | POA: Diagnosis not present

## 2013-12-28 DIAGNOSIS — Z8719 Personal history of other diseases of the digestive system: Secondary | ICD-10-CM | POA: Insufficient documentation

## 2013-12-28 DIAGNOSIS — Z85828 Personal history of other malignant neoplasm of skin: Secondary | ICD-10-CM | POA: Insufficient documentation

## 2013-12-28 DIAGNOSIS — M7989 Other specified soft tissue disorders: Secondary | ICD-10-CM | POA: Diagnosis not present

## 2013-12-28 DIAGNOSIS — F3289 Other specified depressive episodes: Secondary | ICD-10-CM | POA: Diagnosis not present

## 2013-12-28 DIAGNOSIS — M19049 Primary osteoarthritis, unspecified hand: Secondary | ICD-10-CM | POA: Diagnosis not present

## 2013-12-28 LAB — COMPREHENSIVE METABOLIC PANEL
ALT: 19 U/L (ref 0–35)
AST: 27 U/L (ref 0–37)
Albumin: 3.2 g/dL — ABNORMAL LOW (ref 3.5–5.2)
Alkaline Phosphatase: 64 U/L (ref 39–117)
BUN: 11 mg/dL (ref 6–23)
CO2: 21 mEq/L (ref 19–32)
Calcium: 9.8 mg/dL (ref 8.4–10.5)
Chloride: 98 mEq/L (ref 96–112)
Creatinine, Ser: 0.71 mg/dL (ref 0.50–1.10)
GFR calc Af Amer: >90 mL/min (ref >90)
GFR calc non Af Amer: 79 mL/min — ABNORMAL LOW (ref >90)
Glucose, Bld: 115 mg/dL — ABNORMAL HIGH (ref 70–99)
Potassium: 3.6 mEq/L — ABNORMAL LOW (ref 3.7–5.3)
Sodium: 135 mEq/L — ABNORMAL LOW (ref 137–147)
Total Bilirubin: 0.4 mg/dL (ref 0.3–1.2)
Total Protein: 7.1 g/dL (ref 6.0–8.3)

## 2013-12-28 LAB — CBC WITH DIFFERENTIAL/PLATELET
Basophils Absolute: 0 10*3/uL (ref 0.0–0.1)
Basophils Relative: 0 % (ref 0–1)
Eosinophils Absolute: 1.1 10*3/uL — ABNORMAL HIGH (ref 0.0–0.7)
Eosinophils Relative: 9 % — ABNORMAL HIGH (ref 0–5)
HCT: 31 % — ABNORMAL LOW (ref 36.0–46.0)
Hemoglobin: 10.4 g/dL — ABNORMAL LOW (ref 12.0–15.0)
Lymphocytes Relative: 39 % (ref 12–46)
Lymphs Abs: 4.9 10*3/uL — ABNORMAL HIGH (ref 0.7–4.0)
MCH: 32.7 pg (ref 26.0–34.0)
MCHC: 33.5 g/dL (ref 30.0–36.0)
MCV: 97.5 fL (ref 78.0–100.0)
Monocytes Absolute: 0.8 10*3/uL (ref 0.1–1.0)
Monocytes Relative: 6 % (ref 3–12)
Neutro Abs: 5.7 10*3/uL (ref 1.7–7.7)
Neutrophils Relative %: 46 % (ref 43–77)
Platelets: 316 10*3/uL (ref 150–400)
RBC: 3.18 MIL/uL — ABNORMAL LOW (ref 3.87–5.11)
RDW: 17.3 % — ABNORMAL HIGH (ref 11.5–15.5)
WBC: 12.4 10*3/uL — ABNORMAL HIGH (ref 4.0–10.5)

## 2013-12-28 LAB — SEDIMENTATION RATE: Sed Rate: 65 mm/hr — ABNORMAL HIGH (ref 0–22)

## 2013-12-28 MED ORDER — ONDANSETRON HCL 4 MG/2ML IJ SOLN
4.0000 mg | Freq: Once | INTRAMUSCULAR | Status: AC
  Administered 2013-12-28: 4 mg via INTRAVENOUS
  Filled 2013-12-28: qty 2

## 2013-12-28 MED ORDER — HYDROMORPHONE HCL PF 1 MG/ML IJ SOLN
0.5000 mg | Freq: Once | INTRAMUSCULAR | Status: AC
  Administered 2013-12-28: 0.5 mg via INTRAVENOUS
  Filled 2013-12-28: qty 1

## 2013-12-28 MED ORDER — NAPROXEN 500 MG PO TABS: 500.0000 mg | ORAL_TABLET | Freq: Two times a day (BID) | ORAL | Status: DC

## 2013-12-28 MED ORDER — CEPHALEXIN 500 MG PO CAPS: 500.0000 mg | ORAL_CAPSULE | Freq: Four times a day (QID) | ORAL | Status: DC

## 2013-12-28 MED ORDER — OXYCODONE-ACETAMINOPHEN 5-325 MG PO TABS: 1.0000 | ORAL_TABLET | ORAL | Status: DC | PRN

## 2013-12-28 MED ORDER — FENTANYL CITRATE 0.05 MG/ML IJ SOLN
50.0000 ug | Freq: Once | INTRAMUSCULAR | Status: AC
  Administered 2013-12-28: 50 ug via INTRAVENOUS
  Filled 2013-12-28: qty 2

## 2013-12-28 NOTE — ED Provider Notes (Signed)
CSN: [id number]     Arrival date & time 12/28/13  1039 History   First MD Initiated Contact with Patient 12/28/13 1043     Chief Complaint  Patient presents with  . Arm Swelling   (Consider location/radiation/quality/duration/timing/severity/associated sxs/prior Treatment) HPI Comments: Patient is an 80 year old female with a past medical history of colon cancer and hypertension who presents with right arm pain that started last night. Symptoms started gradually and progressively worsened since the onset. The pain is aching and severe with radiation up her right arm. Movement and palpation makes the pain worse. No alleviating factors. Patient reports being in the hospital 2 days ago where she was stuck 9 times in her right arm to attempt to get an IV started. Patient reports a history of infection from previous IV sticks and thinks this is what is happening.  Patient denies any fever. She reports associated swelling and redness to her right arm. Patient reports she finished her last chemotherapy and radiation treatment for colon cancer last month.   Past Medical History  Diagnosis Date  . PONV (postoperative nausea and vomiting)   . Hypertension   . Hyperlipemia   . Hypothyroidism   . GERD (gastroesophageal reflux disease)   . Arthritis   . Depression   . Deaf, left   . Colon cancer 08/31/13    invasive squamous cell  . Cancer     skin  . Skin cancer     Shingles 2012 Bad case   Past Surgical History  Procedure Laterality Date  . Abdominal hysterectomy    . Cholecystectomy    . Tonsillectomy    . Appendectomy    . Thyroidectomy, partial    . Ercp    . Colonoscopy  08/31/13    invasive squamous cell colon  . Eye surgery      cataracts  . Knee arthroscopy  05/11/2012    Procedure: ARTHROSCOPY KNEE;  Surgeon: Hessie Dibble, MD;  Location: [facility];  Service: Orthopedics;  Laterality: Right;  left knee medial menisectomy and chondroplasty  .  Esophagogastroduodenoscopy  10/20/2012    Procedure: ESOPHAGOGASTRODUODENOSCOPY (EGD);  Surgeon: Arta Silence, MD;  Location: [facility];  Service: Endoscopy;  Laterality: Left;  . Cesarean section      2   Family History  Problem Relation Age of Onset  . Kidney disease Mother   . Heart attack Father   . Heart disease Father   . Cancer Sister     breast  . Cancer Daughter     breast  . Cancer Sister     ovarian  . Cancer Sister     melanoma   History  Substance Use Topics  . Smoking status: Never Smoker   . Smokeless tobacco: Never Used  . Alcohol Use: No   OB History   Grav Para Term Preterm Abortions TAB SAB Ect Mult Living                 Review of Systems  Constitutional: Negative for fever, chills and fatigue.  HENT: Negative for trouble swallowing.   Eyes: Negative for visual disturbance.  Respiratory: Negative for shortness of breath.   Cardiovascular: Negative for chest pain and palpitations.  Gastrointestinal: Negative for nausea, vomiting, abdominal pain and diarrhea.  Genitourinary: Negative for dysuria and difficulty urinating.  Musculoskeletal: Positive for arthralgias and joint swelling. Negative for neck pain.  Skin: Negative for color change.  Neurological: Negative for dizziness and weakness.  Psychiatric/Behavioral: Negative  for dysphoric mood.    Allergies  Morphine and related  Home Medications   Current Outpatient Rx  Name  Route  Sig  Dispense  Refill  . ALPRAZolam (XANAX) 0.25 MG tablet   Oral   Take 0.25 mg by mouth 3 (three) times daily as needed for anxiety.         . Alum & Mag Hydroxide-Simeth (MAGIC MOUTHWASH W/LIDOCAINE) SOLN   Oral   Take 10 mLs by mouth 4 (four) times daily as needed for mouth pain.   500 mL   2   . estrogens, conjugated, (PREMARIN) 0.625 MG tablet   Oral   Take 0.625 mg by mouth daily.          Marland Kitchen FLUoxetine (PROZAC) 40 MG capsule   Oral   Take 40 mg by mouth every morning.          Marland Kitchen  levothyroxine (SYNTHROID, LEVOTHROID) 88 MCG tablet   Oral   Take 88 mcg by mouth daily before breakfast.         . Multiple Vitamin (MULTIVITAMIN WITH MINERALS) TABS tablet   Oral   Take 1 tablet by mouth daily.   30 tablet   3   . potassium chloride SA (K-DUR,KLOR-CON) 20 MEQ tablet   Oral   Take 1 tablet (20 mEq total) by mouth daily.   30 tablet   3   . pramipexole (MIRAPEX) 0.125 MG tablet   Oral   Take 1 tablet (0.125 mg total) by mouth at bedtime.   30 tablet   3   . sucralfate (CARAFATE) 1 GM/10ML suspension   Oral   Take 0.5 g by mouth 3 (three) times daily with meals as needed (when on chemo).           BP 162/56  Pulse 69  Temp(Src) 97.7 F (36.5 C) (Oral)  Resp 20  SpO2 100% Physical Exam  Nursing note and vitals reviewed. Constitutional: She is oriented to person, place, and time. She appears well-developed and well-nourished. No distress.  HENT:  Head: Normocephalic and atraumatic.  Eyes: Conjunctivae and EOM are normal.  Neck: Normal range of motion.  Cardiovascular: Normal rate and regular rhythm.  Exam reveals no gallop and no friction rub.   No murmur heard. Pulmonary/Chest: Effort normal and breath sounds normal. She has no wheezes. She has no rales. She exhibits no tenderness.  Abdominal: Soft. There is no tenderness.  Musculoskeletal:  Erythematous, edematous right proximal thumb metacarpal joint. Right hand and wrist limited ROM due to pain. Right hand is warm to touch. No obvious joint deformity.   Neurological: She is alert and oriented to person, place, and time. Coordination normal.  Speech is goal-oriented. Moves limbs without ataxia.   Skin: Skin is warm and dry.  Psychiatric: She has a normal mood and affect. Her behavior is normal.    ED Course  Procedures (including critical care time) Labs Review Labs Reviewed  CBC WITH DIFFERENTIAL - Abnormal; Notable for the following:    WBC 12.4 (*)    RBC 3.18 (*)    Hemoglobin 10.4  (*)    HCT 31.0 (*)    RDW 17.3 (*)    Lymphs Abs 4.9 (*)    Eosinophils Relative 9 (*)    Eosinophils Absolute 1.1 (*)    All other components within normal limits  COMPREHENSIVE METABOLIC PANEL - Abnormal; Notable for the following:    Sodium 135 (*)    Potassium 3.6 (*)  Glucose, Bld 115 (*)    Albumin 3.2 (*)    GFR calc non Af Amer 79 (*)    All other components within normal limits  SEDIMENTATION RATE - Abnormal; Notable for the following:    Sed Rate 65 (*)    All other components within normal limits   Imaging Review Dg Hand Complete Right  12/28/2013   CLINICAL DATA:  Right hand pain and swelling.  No injury.  EXAM: RIGHT HAND - COMPLETE 3+ VIEW  COMPARISON:  None.  FINDINGS: Exam demonstrates moderate degenerative changes over the radial side of the carpal bones and 1st carpometacarpal joint. There are degenerative changes of the interphalangeal joints most prominent at the 2nd distal interphalangeal joint. No acute fracture or dislocation.  IMPRESSION: No acute findings.  Moderate degenerative changes as described.   Electronically Signed   By: Marin Olp M.D.   On: 12/28/2013 12:31    EKG Interpretation   None       MDM   1. Swelling of joint of right hand     11:25 AM Labs and xray of right hand pending. Vitals stable and patient afebrile. Patient will have fentanyl for pain.   Patient's sed rate is slightly elevated. Attempted joint aspiration with Dr. Wilson Singer which was unsuccessful. Patient will be discharged with pain medication, antiinflammatory, and antibiotic to take as needed. Patient will have recommended follow up with hand surgeon.   Alvina Chou, [place] 12/29/13 1738

## 2013-12-28 NOTE — ED Notes (Signed)
Bed: WA14 Expected date:  Expected time:  Means of arrival:  Comments: EMS infection 

## 2013-12-28 NOTE — ED Notes (Signed)
Patient is very anxious. Little to no swelling noted to the right wrist and right hand. Patient states the wrist and hand is where the majority of the pain is.

## 2013-12-28 NOTE — Discharge Instructions (Signed)
Take Keflex if symptoms do not improve with pain medication and antiinflammatory medication. Follow up with Dr. Grandville Silos for further evaluation and management of your symptoms. Take Percocet as needed for pain. Take Naprosyn as needed for inflammation. Return to the ED with worsening or concerning symptoms.

## 2013-12-28 NOTE — ED Notes (Signed)
Per EMS- Patient was seen in the ED 2 days ago and family reports that the patient's right arm was stuck several times for an IV. Today, patient has right arm swelling and c/o right arm pain that she rates 8/10.

## 2013-12-29 ENCOUNTER — Telehealth: Payer: Self-pay | Admitting: *Deleted

## 2013-12-29 ENCOUNTER — Ambulatory Visit
Admission: RE | Admit: 2013-12-29 | Discharge: 2013-12-29 | Disposition: A | Discharge status: Home / Self Care | Payer: Medicare Other | Source: Ambulatory Visit | Attending: Radiation Oncology | Admitting: Radiation Oncology

## 2013-12-29 VITALS — BP 114/68 | HR 59 | Temp 97.5°F | Wt 139.7 lb

## 2013-12-29 DIAGNOSIS — C210 Malignant neoplasm of anus, unspecified: Secondary | ICD-10-CM

## 2013-12-29 HISTORY — DX: Personal history of irradiation: Z92.3

## 2013-12-29 NOTE — Telephone Encounter (Signed)
Called patient home to see if she still wanted to come for follow up appt seeing that she was just d/c from the hospital yesterday,"I'm still going to try and make it today to see Dr.Moody", asked patient to call if she cannot make the appt and if can be here to register at 1130 am,patient thanked me for calling and checking on her status 9:43 AM

## 2013-12-29 NOTE — Progress Notes (Signed)
Radiation Oncology         [phone] ________________________________  Name: Cindy [name] MRN: [id number]  Date: 12/29/2013  DOB: 12/21/1930  Follow-Up Visit Note  CC: Gerrit Heck, MD  Randa Lynn*  Diagnosis:   Squamous cell carcinoma of the anal canal  Interval Since Last Radiation:  The patient completed radiation treatment on 11/23/2013   Narrative:  The patient returns today for routine follow-up.  The patient has been hospitalized a couple of times that she finished treatment. She experienced some dehydration and hypokalemia and was discharged a couple of weeks for this. She also has been having some panic attacks. The patient indicates that her skin has healed well in the anal region. She is pleased with this. Some occasional constipation, possibly secondary to pain medication. Overall her bowel movements have normalized with difficulties with diarrhea which was a major initial compliant.                              ALLERGIES:  is allergic to morphine and related.  Meds: Current Outpatient Prescriptions  Medication Sig Dispense Refill  . ALPRAZolam (XANAX) 0.25 MG tablet Take 0.25 mg by mouth 3 (three) times daily as needed for anxiety.      . Alum & Mag Hydroxide-Simeth (MAGIC MOUTHWASH W/LIDOCAINE) SOLN Take 10 mLs by mouth 4 (four) times daily as needed for mouth pain.  500 mL  2  . cephALEXin (KEFLEX) 500 MG capsule Take 1 capsule (500 mg total) by mouth 4 (four) times daily.  40 capsule  0  . estrogens, conjugated, (PREMARIN) 0.625 MG tablet Take 0.625 mg by mouth daily.       Marland Kitchen FLUoxetine (PROZAC) 40 MG capsule Take 40 mg by mouth every morning.       Marland Kitchen levothyroxine (SYNTHROID, LEVOTHROID) 88 MCG tablet Take 88 mcg by mouth daily before breakfast.      . Multiple Vitamin (MULTIVITAMIN WITH MINERALS) TABS tablet Take 1 tablet by mouth daily.  30 tablet  3  . naproxen (NAPROSYN) 500 MG tablet Take 1 tablet (500 mg total) by mouth 2 (two) times  daily with a meal.  30 tablet  0  . oxyCODONE-acetaminophen (PERCOCET/ROXICET) 5-325 MG per tablet Take 1-2 tablets by mouth every 4 (four) hours as needed for severe pain.  16 tablet  0  . potassium chloride SA (K-DUR,KLOR-CON) 20 MEQ tablet Take 1 tablet (20 mEq total) by mouth daily.  30 tablet  3  . pramipexole (MIRAPEX) 0.125 MG tablet Take 1 tablet (0.125 mg total) by mouth at bedtime.  30 tablet  3  . sucralfate (CARAFATE) 1 GM/10ML suspension Take 0.5 g by mouth 3 (three) times daily with meals as needed (when on chemo).        No current facility-administered medications for this encounter.    Physical Findings: The patient is in no acute distress. Patient is alert and oriented.  weight is 139 lb 11.2 oz (63.368 kg). Her temperature is 97.5 F (36.4 C). Her blood pressure is 114/68 and her pulse is 59. Her oxygen saturation is 95%. .   The patient's scan shows radiation change but has healed very well. No ongoing desquamation  Lab Findings: Lab Results  Component Value Date   WBC 12.4* 12/28/2013   HGB 10.4* 12/28/2013   HCT 31.0* 12/28/2013   MCV 97.5 12/28/2013   PLT 316 12/28/2013     Radiographic Findings: Dg Chest 2  View  12/24/2013   CLINICAL DATA:  Weakness and shortness of breath.  EXAM: CHEST  2 VIEW  COMPARISON:  10/23/2013  FINDINGS: The lungs are adequately inflated without consolidation or effusion. There is stable borderline cardiomegaly. Remainder of the exam is unchanged.  IMPRESSION: No active cardiopulmonary disease.   Electronically Signed   By: Marin Olp M.D.   On: 12/24/2013 19:01   Nm Pulmonary Perf And Vent  12/25/2013   CLINICAL DATA:  Shortness of breath. Evaluate for pulmonary embolism  EXAM: NUCLEAR MEDICINE VENTILATION - PERFUSION LUNG SCAN  TECHNIQUE: Ventilation images were obtained in multiple projections using inhaled aerosol technetium 99 M DTPA. Perfusion images were obtained in multiple projections after intravenous injection of Tc-70m MAA.   COMPARISON:  Chest x-ray from the same day  RADIOPHARMACEUTICALS:  40 mCi Tc-52m DTPA aerosol and 4.64mCi Tc-55m MAA  FINDINGS: Ventilation: No focal ventilation defect. Preferential deposition of radiotracer in the left central airways, of uncertain clinical significance. There is good aeration of the right lung as well.  Perfusion: No wedge shaped peripheral perfusion defects to suggest acute pulmonary embolism.  Radiotracer seen in the stomach and the level of the neck. No renal tracer seen.  IMPRESSION: Normal lung perfusion.  No evidence for pulmonary embolism.   Electronically Signed   By: Jorje Guild M.D.   On: 12/25/2013 00:16   Dg Hand Complete Right  12/28/2013   CLINICAL DATA:  Right hand pain and swelling.  No injury.  EXAM: RIGHT HAND - COMPLETE 3+ VIEW  COMPARISON:  None.  FINDINGS: Exam demonstrates moderate degenerative changes over the radial side of the carpal bones and 1st carpometacarpal joint. There are degenerative changes of the interphalangeal joints most prominent at the 2nd distal interphalangeal joint. No acute fracture or dislocation.  IMPRESSION: No acute findings.  Moderate degenerative changes as described.   Electronically Signed   By: Marin Olp M.D.   On: 12/28/2013 12:31    Impression:    The patient is doing satisfactorily in terms of healing from her course of radiation treatment for anal cancer.  Plan:  Followup in 3 months.   Jodelle Gross, M.D., Ph.D.

## 2013-12-29 NOTE — Progress Notes (Signed)
Patient for routine one month follow up completion of radiation to anal canal on 11/23/2013.Recently hospitalized twice once for dehydration, hypokalemia and panic attack and the second time for right arm pain which was negative for dvt.Started on keflex.See med list for other med changes.Family doubled up on xanax 2 tid to help with panic attacks.Told not to change dose without doctor permission and to just give 1 percocet and follow up with another if pain unresolved as patient is sedated.

## 2013-12-30 ENCOUNTER — Telehealth: Payer: Self-pay | Admitting: *Deleted

## 2013-12-30 DIAGNOSIS — C210 Malignant neoplasm of anus, unspecified: Secondary | ICD-10-CM | POA: Diagnosis not present

## 2013-12-30 DIAGNOSIS — D708 Other neutropenia: Secondary | ICD-10-CM | POA: Diagnosis not present

## 2013-12-30 DIAGNOSIS — Z5111 Encounter for antineoplastic chemotherapy: Secondary | ICD-10-CM | POA: Diagnosis not present

## 2013-12-30 DIAGNOSIS — K1231 Oral mucositis (ulcerative) due to antineoplastic therapy: Secondary | ICD-10-CM | POA: Diagnosis not present

## 2013-12-30 DIAGNOSIS — E46 Unspecified protein-calorie malnutrition: Secondary | ICD-10-CM | POA: Diagnosis not present

## 2013-12-30 NOTE — ED Provider Notes (Signed)
Medical screening examination/treatment/procedure(s) were conducted as a shared visit with non-physician practitioner(s) and myself.  I personally evaluated the patient during the encounter.  EKG Interpretation   None      83yF with atraumatic R hand/wrist pain. Clinical suspicion is reactive arthropathy, not cellulitis. Cannot r/o septic joint. Attempted aspiration unsuccessfully. Sed Rate is elevated. Will tx pain and cover with keflex for this possibility. Close hand surgical FU.   Virgel Manifold, MD 12/30/13 [phone]

## 2013-12-30 NOTE — Telephone Encounter (Signed)
Call from [place], [facility] RN reporting BP 153/72. BP has been elevated last few times they've been out to the home. Also wanted to make Dr. Benay Spice aware pt is on Keflex for infected IV site. RN recommended pt "reconnect with PCP." Pt was seen by RadOnc on 1/8 with normal BP. They will continue to monitor. PT is following pt since discharge from hospital, OT will evaluate next week.

## 2014-01-02 DIAGNOSIS — E46 Unspecified protein-calorie malnutrition: Secondary | ICD-10-CM | POA: Diagnosis not present

## 2014-01-02 DIAGNOSIS — Z5111 Encounter for antineoplastic chemotherapy: Secondary | ICD-10-CM | POA: Diagnosis not present

## 2014-01-02 DIAGNOSIS — D708 Other neutropenia: Secondary | ICD-10-CM | POA: Diagnosis not present

## 2014-01-02 DIAGNOSIS — K1231 Oral mucositis (ulcerative) due to antineoplastic therapy: Secondary | ICD-10-CM | POA: Diagnosis not present

## 2014-01-02 DIAGNOSIS — C210 Malignant neoplasm of anus, unspecified: Secondary | ICD-10-CM | POA: Diagnosis not present

## 2014-01-03 ENCOUNTER — Encounter (INDEPENDENT_AMBULATORY_CARE_PROVIDER_SITE_OTHER): Payer: Self-pay | Admitting: General Surgery

## 2014-01-03 ENCOUNTER — Encounter (INDEPENDENT_AMBULATORY_CARE_PROVIDER_SITE_OTHER): Payer: Self-pay

## 2014-01-03 ENCOUNTER — Ambulatory Visit (INDEPENDENT_AMBULATORY_CARE_PROVIDER_SITE_OTHER): Payer: Medicare Other | Admitting: General Surgery

## 2014-01-03 VITALS — BP 142/90 | HR 88 | Temp 97.6°F | Resp 15 | Ht 65.0 in | Wt 136.4 lb

## 2014-01-03 DIAGNOSIS — C210 Malignant neoplasm of anus, unspecified: Secondary | ICD-10-CM | POA: Diagnosis not present

## 2014-01-03 DIAGNOSIS — E46 Unspecified protein-calorie malnutrition: Secondary | ICD-10-CM | POA: Diagnosis not present

## 2014-01-03 DIAGNOSIS — D708 Other neutropenia: Secondary | ICD-10-CM | POA: Diagnosis not present

## 2014-01-03 DIAGNOSIS — K1231 Oral mucositis (ulcerative) due to antineoplastic therapy: Secondary | ICD-10-CM | POA: Diagnosis not present

## 2014-01-03 DIAGNOSIS — Z5111 Encounter for antineoplastic chemotherapy: Secondary | ICD-10-CM | POA: Diagnosis not present

## 2014-01-03 NOTE — Progress Notes (Signed)
PEGGIE [name] is a 80 y.o. female who is here for a follow up visit regarding her anal cancer.  She is s/p treatment with 1 round of chemotherapy and full dose of radiation.  Her last radiation treatment was early Dec. She did not complete her chemotherapy due to intolerance.  She reports symptoms from radiation (incontinence and burning) are getting better.  She has had several episodes of shortness of breath in the last few weeks, which were diagnosed in the ED as panic attacks.    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 Staging PET scan 09/23/2013 of a hypermetabolic anal mass and hypermetabolic right inguinal/perineal nodes. Radiation and cycle 1 5-FU/mitomycin C. beginning 10/10/2013. Radiation completed 11/23/2013, she declined the second cycle of chemotherapy Question palpable inguinal/pubic lymphadenopathy. The suprapubic palpable lesions appeared hypermetabolic on the PET scan.    Objective: Filed Vitals:   01/03/14 0928  BP: 142/90  Pulse: 88  Temp: 97.6 F (36.4 C)  Resp: 15    General appearance: alert and cooperative GI: normal findings: soft, non-tender Extremities: no obvious inguinal lyphadenopathy  Procedure: Anoscopy Surgeon: Marcello Moores Assistant: Alphonzo Severance After the risks and benefits were explained, verbal consent was obtained for above procedure  Anesthesia: lidocaine jelly Diagnosis: anal cancer Findings: R lateral anal canal ulceration, appears to be responding well to treatment   Assessment and Plan:  Cindy [name] is a 80 y.o. F with anal SCC.  It seems that she is responding well to treatment.  I will see her back in 2 months for repeat anoscopy.     Rosario Adie, [facility], [place]

## 2014-01-03 NOTE — Patient Instructions (Signed)
Return to the office in 2 months for repeat exam

## 2014-01-04 DIAGNOSIS — K1231 Oral mucositis (ulcerative) due to antineoplastic therapy: Secondary | ICD-10-CM | POA: Diagnosis not present

## 2014-01-04 DIAGNOSIS — C210 Malignant neoplasm of anus, unspecified: Secondary | ICD-10-CM | POA: Diagnosis not present

## 2014-01-04 DIAGNOSIS — D708 Other neutropenia: Secondary | ICD-10-CM | POA: Diagnosis not present

## 2014-01-04 DIAGNOSIS — E46 Unspecified protein-calorie malnutrition: Secondary | ICD-10-CM | POA: Diagnosis not present

## 2014-01-04 DIAGNOSIS — Z5111 Encounter for antineoplastic chemotherapy: Secondary | ICD-10-CM | POA: Diagnosis not present

## 2014-01-05 ENCOUNTER — Telehealth: Payer: Self-pay | Admitting: *Deleted

## 2014-01-05 DIAGNOSIS — C210 Malignant neoplasm of anus, unspecified: Secondary | ICD-10-CM | POA: Diagnosis not present

## 2014-01-05 DIAGNOSIS — D708 Other neutropenia: Secondary | ICD-10-CM | POA: Diagnosis not present

## 2014-01-05 DIAGNOSIS — Z5111 Encounter for antineoplastic chemotherapy: Secondary | ICD-10-CM | POA: Diagnosis not present

## 2014-01-05 DIAGNOSIS — K1231 Oral mucositis (ulcerative) due to antineoplastic therapy: Secondary | ICD-10-CM | POA: Diagnosis not present

## 2014-01-05 DIAGNOSIS — E46 Unspecified protein-calorie malnutrition: Secondary | ICD-10-CM | POA: Diagnosis not present

## 2014-01-05 NOTE — Telephone Encounter (Signed)
Spoke with [facility] @ [facility] and informed her re:  Per Dr. Benay Spice,  Pt's issues not related to cancer.  Nira Conn needs to inform pt's primary of above problems.  Nira Conn stated home health services were ordered when pt was discharged from the hospital.  Pt has appt with her primary on Tues.  01/10/14.  Heather voiced understanding.

## 2014-01-05 NOTE — Telephone Encounter (Signed)
Received call from [place], [place] @ [facility] reporting re:  When nurse made visit today, pt complained of sudden nausea and lightheadedness when washing dishes this am.  Pt told Nira Conn that she felt like anxiety attack coming on.  Pt also complained of abdominal cramping.  Pt did have good bowel movement today.  Pt denied problems with constipation nor diarrhea.  Heather assessed pt and reported abdominal soft with hyperactive bowel sounds.  The pain is on left upper quadrant of abdomen. Instructed Heather that pt can take Xanax to help with anxiety.  Pt can also take 1 tablet of Percocet to help with the pain.  Instructed Heather to make sure pt not driving or doing heavy activities after taking these meds. Nira Conn stated pt is resting in bed at present.  Instructed either Heather or pt to call office back if symptoms worsened.  Heather voiced understanding. Heather's  Phone    [phone].

## 2014-01-09 DIAGNOSIS — E46 Unspecified protein-calorie malnutrition: Secondary | ICD-10-CM | POA: Diagnosis not present

## 2014-01-09 DIAGNOSIS — C210 Malignant neoplasm of anus, unspecified: Secondary | ICD-10-CM | POA: Diagnosis not present

## 2014-01-09 DIAGNOSIS — K1231 Oral mucositis (ulcerative) due to antineoplastic therapy: Secondary | ICD-10-CM | POA: Diagnosis not present

## 2014-01-09 DIAGNOSIS — Z5111 Encounter for antineoplastic chemotherapy: Secondary | ICD-10-CM | POA: Diagnosis not present

## 2014-01-09 DIAGNOSIS — D708 Other neutropenia: Secondary | ICD-10-CM | POA: Diagnosis not present

## 2014-01-10 DIAGNOSIS — E538 Deficiency of other specified B group vitamins: Secondary | ICD-10-CM | POA: Diagnosis not present

## 2014-01-10 DIAGNOSIS — E559 Vitamin D deficiency, unspecified: Secondary | ICD-10-CM | POA: Diagnosis not present

## 2014-01-10 DIAGNOSIS — F411 Generalized anxiety disorder: Secondary | ICD-10-CM | POA: Diagnosis not present

## 2014-01-10 DIAGNOSIS — I10 Essential (primary) hypertension: Secondary | ICD-10-CM | POA: Diagnosis not present

## 2014-01-11 DIAGNOSIS — D708 Other neutropenia: Secondary | ICD-10-CM | POA: Diagnosis not present

## 2014-01-11 DIAGNOSIS — Z5111 Encounter for antineoplastic chemotherapy: Secondary | ICD-10-CM | POA: Diagnosis not present

## 2014-01-11 DIAGNOSIS — E46 Unspecified protein-calorie malnutrition: Secondary | ICD-10-CM | POA: Diagnosis not present

## 2014-01-11 DIAGNOSIS — C210 Malignant neoplasm of anus, unspecified: Secondary | ICD-10-CM | POA: Diagnosis not present

## 2014-01-11 DIAGNOSIS — K1231 Oral mucositis (ulcerative) due to antineoplastic therapy: Secondary | ICD-10-CM | POA: Diagnosis not present

## 2014-01-12 DIAGNOSIS — K1231 Oral mucositis (ulcerative) due to antineoplastic therapy: Secondary | ICD-10-CM | POA: Diagnosis not present

## 2014-01-12 DIAGNOSIS — C210 Malignant neoplasm of anus, unspecified: Secondary | ICD-10-CM | POA: Diagnosis not present

## 2014-01-12 DIAGNOSIS — Z5111 Encounter for antineoplastic chemotherapy: Secondary | ICD-10-CM | POA: Diagnosis not present

## 2014-01-12 DIAGNOSIS — D708 Other neutropenia: Secondary | ICD-10-CM | POA: Diagnosis not present

## 2014-01-12 DIAGNOSIS — E46 Unspecified protein-calorie malnutrition: Secondary | ICD-10-CM | POA: Diagnosis not present

## 2014-01-13 DIAGNOSIS — D708 Other neutropenia: Secondary | ICD-10-CM | POA: Diagnosis not present

## 2014-01-13 DIAGNOSIS — E46 Unspecified protein-calorie malnutrition: Secondary | ICD-10-CM | POA: Diagnosis not present

## 2014-01-13 DIAGNOSIS — K1231 Oral mucositis (ulcerative) due to antineoplastic therapy: Secondary | ICD-10-CM | POA: Diagnosis not present

## 2014-01-13 DIAGNOSIS — Z5111 Encounter for antineoplastic chemotherapy: Secondary | ICD-10-CM | POA: Diagnosis not present

## 2014-01-13 DIAGNOSIS — C210 Malignant neoplasm of anus, unspecified: Secondary | ICD-10-CM | POA: Diagnosis not present

## 2014-01-16 DIAGNOSIS — C210 Malignant neoplasm of anus, unspecified: Secondary | ICD-10-CM | POA: Diagnosis not present

## 2014-01-16 DIAGNOSIS — D708 Other neutropenia: Secondary | ICD-10-CM | POA: Diagnosis not present

## 2014-01-16 DIAGNOSIS — Z5111 Encounter for antineoplastic chemotherapy: Secondary | ICD-10-CM | POA: Diagnosis not present

## 2014-01-16 DIAGNOSIS — K1231 Oral mucositis (ulcerative) due to antineoplastic therapy: Secondary | ICD-10-CM | POA: Diagnosis not present

## 2014-01-16 DIAGNOSIS — E46 Unspecified protein-calorie malnutrition: Secondary | ICD-10-CM | POA: Diagnosis not present

## 2014-01-18 DIAGNOSIS — E46 Unspecified protein-calorie malnutrition: Secondary | ICD-10-CM | POA: Diagnosis not present

## 2014-01-18 DIAGNOSIS — Z5111 Encounter for antineoplastic chemotherapy: Secondary | ICD-10-CM | POA: Diagnosis not present

## 2014-01-18 DIAGNOSIS — C210 Malignant neoplasm of anus, unspecified: Secondary | ICD-10-CM | POA: Diagnosis not present

## 2014-01-18 DIAGNOSIS — K1231 Oral mucositis (ulcerative) due to antineoplastic therapy: Secondary | ICD-10-CM | POA: Diagnosis not present

## 2014-01-18 DIAGNOSIS — D708 Other neutropenia: Secondary | ICD-10-CM | POA: Diagnosis not present

## 2014-02-24 ENCOUNTER — Ambulatory Visit (HOSPITAL_BASED_OUTPATIENT_CLINIC_OR_DEPARTMENT_OTHER): Payer: Medicare Other | Admitting: Nurse Practitioner

## 2014-02-24 ENCOUNTER — Telehealth: Payer: Self-pay | Admitting: Oncology

## 2014-02-24 VITALS — BP 147/73 | HR 71 | Temp 97.2°F | Resp 18 | Ht 65.0 in | Wt 142.1 lb

## 2014-02-24 DIAGNOSIS — R197 Diarrhea, unspecified: Secondary | ICD-10-CM

## 2014-02-24 DIAGNOSIS — C210 Malignant neoplasm of anus, unspecified: Secondary | ICD-10-CM

## 2014-02-24 DIAGNOSIS — D61810 Antineoplastic chemotherapy induced pancytopenia: Secondary | ICD-10-CM

## 2014-02-24 DIAGNOSIS — R948 Abnormal results of function studies of other organs and systems: Secondary | ICD-10-CM | POA: Diagnosis not present

## 2014-02-24 DIAGNOSIS — T451X5A Adverse effect of antineoplastic and immunosuppressive drugs, initial encounter: Secondary | ICD-10-CM

## 2014-02-24 DIAGNOSIS — R110 Nausea: Secondary | ICD-10-CM | POA: Diagnosis not present

## 2014-02-24 NOTE — Progress Notes (Addendum)
OFFICE PROGRESS NOTE  Interval history:  [name] returns for followup of anal cancer. She is beginning to feel stronger. Appetite continues to be poor. She reports her weight is stable to improved. She is intermittently constipated. She takes a stool softener and drinks prune juice. She denies any rectal bleeding or pain.   Objective: Filed Vitals:   02/24/14 1207  BP: 147/73  Pulse: 71  Temp: 97.2 F (36.2 C)  Resp: 18   Oropharynx is without thrush or ulceration. No palpable cervical, supraclavicular, axillary or inguinal lymph nodes. Lungs are clear. No wheezes or rales. Regular cardiac rhythm. Abdomen soft and nontender. No organomegaly. No leg edema. Perineum is without erythema and skin breakdown. Areas of bilateral cutaneous irregularity/nodularity in the suprapubic region.   Lab Results: Lab Results  Component Value Date   WBC 12.4* 12/28/2013   HGB 10.4* 12/28/2013   HCT 31.0* 12/28/2013   MCV 97.5 12/28/2013   PLT 316 12/28/2013   NEUTROABS 5.7 12/28/2013    Chemistry:    Chemistry      Component Value Date/Time   NA 135* 12/28/2013 1140   NA 130* 12/02/2013 1330   K 3.6* 12/28/2013 1140   K 4.3 12/02/2013 1330   CL 98 12/28/2013 1140   CO2 21 12/28/2013 1140   CO2 22 12/02/2013 1330   BUN 11 12/28/2013 1140   BUN 7.5 12/02/2013 1330   CREATININE 0.71 12/28/2013 1140   CREATININE 0.8 12/02/2013 1330   CREATININE 0.78 09/09/2013 1102      Component Value Date/Time   CALCIUM 9.8 12/28/2013 1140   CALCIUM 9.4 12/02/2013 1330   ALKPHOS 64 12/28/2013 1140   ALKPHOS 65 12/02/2013 1330   AST 27 12/28/2013 1140   AST 42* 12/02/2013 1330   ALT 19 12/28/2013 1140   ALT 31 12/02/2013 1330   BILITOT 0.4 12/28/2013 1140   BILITOT 0.46 12/02/2013 1330       Studies/Results: No results found.  Medications: I have reviewed the patient's current medications.  Assessment/Plan: 1. Squamous cell carcinoma of the anus. Staging PET scan 09/23/2013 of a hypermetabolic anal mass and  hypermetabolic right inguinal/perineal nodes. Radiation and cycle 1 5-FU/mitomycin C. beginning 10/10/2013. Radiation completed 11/23/2013, she declined the second cycle of chemotherapy. 2. Question palpable inguinal/pubic lymphadenopathy. The suprapubic palpable lesions appeared hypermetabolic on the PET scan. 3. History of rectal pain and bleeding secondary to #1.  4. Nausea predating chemotherapy.  5. Thickening/hypermetabolism at the splenic flexure. Question second primary. 6. Mucositis secondary to chemotherapy. Resolved 7. Skin rash likely related to 5-fluorouracil. Resolved 8. Pancytopenia secondary to chemotherapy. Improved. 9. Diarrhea-likely secondary to chemotherapy induced enteritis, C. Difficile negative 10/23/2013.  10. History of hypokalemia secondary to diarrhea 11. Hospitalization with febrile neutropenia 10/23/2013 through 10/27/2013. Cultures negative.  Dispositon-she appears stable. She has an appointment with Dr. Marcello Moores next week. We scheduled a followup visit here in 6 months.  The initial PET scan showed thickening/hypermetabolism at the splenic flexure. If she has not had a recent colonoscopy we will make a referral.  She will contact the office prior to her next visit with any problems.  Patient seen with Dr. Benay Spice.   Ned Card ANP/GNP-BC   This was a shared visit with Ned Card. The suprapubic nodularity is still palpable, but appears smaller. We will discuss the indication for additional imaging with Dr. Marcello Moores. She will be referred for a colonoscopy.  Cindy [name], M.D.

## 2014-02-24 NOTE — Telephone Encounter (Signed)
gave pt appt for Md only on September

## 2014-02-28 ENCOUNTER — Encounter (INDEPENDENT_AMBULATORY_CARE_PROVIDER_SITE_OTHER): Payer: Self-pay | Admitting: General Surgery

## 2014-02-28 ENCOUNTER — Ambulatory Visit (INDEPENDENT_AMBULATORY_CARE_PROVIDER_SITE_OTHER): Payer: Medicare Other | Admitting: General Surgery

## 2014-02-28 VITALS — BP 152/70 | HR 80 | Temp 98.6°F | Resp 20 | Ht 65.0 in | Wt 141.4 lb

## 2014-02-28 DIAGNOSIS — C210 Malignant neoplasm of anus, unspecified: Secondary | ICD-10-CM | POA: Diagnosis not present

## 2014-02-28 NOTE — Patient Instructions (Signed)
Return to the office in 3 months 

## 2014-02-28 NOTE — Progress Notes (Signed)
Cindy [name] is a 80 y.o. female who is here for a follow up visit regarding her anal cancer. She is s/p treatment with 1 round of chemotherapy and full dose of radiation. Her last radiation treatment was early Dec. She did not complete her chemotherapy due to intolerance. She reports symptoms from radiation (incontinence and burning) are getting better, but she still has som pain on her L side. She is having some trouble with constipation and irregular bowel movements. She is using prune-juice for this.  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 Staging PET scan 09/23/2013 of a hypermetabolic anal mass and hypermetabolic right inguinal/perineal nodes. Radiation and cycle 1 5-FU/mitomycin C. beginning 10/10/2013.  Radiation completed 11/23/2013, she declined the second cycle of chemotherapy.  Question palpable inguinal/pubic lymphadenopathy. The suprapubic palpable lesions appeared hypermetabolic on the PET scan.  Objective:  Filed Vitals:   02/28/14 1207  BP: 152/70  Pulse: 80  Temp: 98.6 F (37 C)  Resp: 20   General appearance: alert and cooperative  GI: normal findings: soft, non-tender  Extremities: no obvious inguinal lyphadenopathy  Procedure: Anoscopy  Surgeon: Marcello Moores  Assistant: Dory Larsen After the risks and benefits were explained, verbal consent was obtained for above procedure  Anesthesia: none  Diagnosis: anal cancer  Findings: R lateral anal canal scar, No signs of residual tumor  Assessment and Plan:  Cindy [name] is a 80 y.o. F with anal SCC. It seems that she has responding well to treatment. I will see her back in 3 months for repeat anoscopy.   Rosario Adie, [facility], [place]

## 2014-03-01 ENCOUNTER — Telehealth: Payer: Self-pay | Admitting: Oncology

## 2014-03-01 NOTE — Telephone Encounter (Signed)
Talked to pt and gave her appt for GI @ [facility] Dr. Paulita Fujita on 4/28 2:30pm , nurse notified

## 2014-03-06 ENCOUNTER — Telehealth: Payer: Self-pay | Admitting: Oncology

## 2014-03-06 NOTE — Telephone Encounter (Signed)
Faxed pt medical records to Dr. Outlaw °

## 2014-03-29 ENCOUNTER — Encounter: Payer: Self-pay | Admitting: Radiation Oncology

## 2014-03-29 ENCOUNTER — Ambulatory Visit
Admission: RE | Admit: 2014-03-29 | Discharge: 2014-03-29 | Disposition: A | Discharge status: Home / Self Care | Payer: Medicare Other | Source: Ambulatory Visit | Attending: Radiation Oncology | Admitting: Radiation Oncology

## 2014-03-29 VITALS — BP 132/61 | HR 84 | Temp 98.4°F | Resp 20 | Wt 144.3 lb

## 2014-03-29 DIAGNOSIS — C210 Malignant neoplasm of anus, unspecified: Secondary | ICD-10-CM | POA: Diagnosis not present

## 2014-03-29 NOTE — Progress Notes (Signed)
  Radiation Oncology         [phone] ________________________________  Name: Cindy [name] MRN: [id number]  Date: 03/29/2014  DOB: 1930/10/10  Follow-Up Visit Note  CC: Henrine Screws, MD  Leighton Ruff, MD  Diagnosis:   Carcinoma of the anal canal  Interval Since Last Radiation:  4 months   Narrative:  The patient returns today for routine follow-up.  The patient states that she is doing reasonably well overall. She does complain of some ongoing aching in her legs which has been present for several months. This has been stable without any worsening symptoms. The patient also complains of some fatigue and difficulty sleeping. She states that she is having good bowel movements. She is using a stool softener which helps. Some pain with bowel movements and this is better with softer stools. No blood per rectum. Some increased frequency of urination. No hematuria and no dysuria.                              ALLERGIES:  is allergic to morphine and related.  Meds: Current Outpatient Prescriptions  Medication Sig Dispense Refill  . ALPRAZolam (XANAX) 0.25 MG tablet Take 0.25 mg by mouth 3 (three) times daily as needed for anxiety.      Marland Kitchen estrogens, conjugated, (PREMARIN) 0.625 MG tablet Take 0.625 mg by mouth daily.       Marland Kitchen FLUoxetine (PROZAC) 40 MG capsule Take 40 mg by mouth every morning.       Marland Kitchen levothyroxine (SYNTHROID, LEVOTHROID) 88 MCG tablet Take 88 mcg by mouth daily before breakfast.      . Multiple Vitamin (MULTIVITAMIN WITH MINERALS) TABS tablet Take 1 tablet by mouth daily.  30 tablet  3  . potassium chloride SA (K-DUR,KLOR-CON) 20 MEQ tablet Take 1 tablet (20 mEq total) by mouth daily.  30 tablet  3  . Telmisartan-HCTZ (MICARDIS HCT PO) Take by mouth.      . pramipexole (MIRAPEX) 0.125 MG tablet Take 1 tablet (0.125 mg total) by mouth at bedtime.  30 tablet  3   No current facility-administered medications for this encounter.    Physical Findings: The patient is  in no acute distress. Patient is alert and oriented.  weight is 144 lb 4.8 oz (65.454 kg). Her oral temperature is 98.4 F (36.9 C). Her blood pressure is 132/61 and her pulse is 84. Her respiration is 20. .   I don't appreciate any significant inguinal adenopathy on exam today. Hyperpigmentation present in the anal region on exam. No suspicious findings and no blood on exam glove. Some thickening, moderate, within the anal canal present on the right.  Lab Findings: Lab Results  Component Value Date   WBC 12.4* 12/28/2013   HGB 10.4* 12/28/2013   HCT 31.0* 12/28/2013   MCV 97.5 12/28/2013   PLT 316 12/28/2013     Radiographic Findings: No results found.  Impression:    The patient is clinically doing reasonably well. She still does have some discomfort and she appears to continue to be healing. She was placed on the GI conference last week and I will send a note to the GI navigator regarding these discussions.  Plan:  Return to clinic in 4 months for ongoing followup.   Jodelle Gross, M.D., Ph.D.

## 2014-03-29 NOTE — Progress Notes (Signed)
Up s/p rad tx anal completed 11/23/13, takes stool softner generic OTC daily for constipation, has pain whenever she has bowel movements, and then has to lay down, appetite good, still low energy stated, no rectal bleeding stated, has to void more frequently 1:24 PM

## 2014-04-11 DIAGNOSIS — K6289 Other specified diseases of anus and rectum: Secondary | ICD-10-CM | POA: Diagnosis not present

## 2014-04-11 DIAGNOSIS — G2581 Restless legs syndrome: Secondary | ICD-10-CM | POA: Diagnosis not present

## 2014-04-11 DIAGNOSIS — F411 Generalized anxiety disorder: Secondary | ICD-10-CM | POA: Diagnosis not present

## 2014-04-11 DIAGNOSIS — E559 Vitamin D deficiency, unspecified: Secondary | ICD-10-CM | POA: Diagnosis not present

## 2014-04-11 DIAGNOSIS — E538 Deficiency of other specified B group vitamins: Secondary | ICD-10-CM | POA: Diagnosis not present

## 2014-04-11 DIAGNOSIS — I10 Essential (primary) hypertension: Secondary | ICD-10-CM | POA: Diagnosis not present

## 2014-04-18 DIAGNOSIS — R935 Abnormal findings on diagnostic imaging of other abdominal regions, including retroperitoneum: Secondary | ICD-10-CM | POA: Diagnosis not present

## 2014-04-20 DIAGNOSIS — Z1231 Encounter for screening mammogram for malignant neoplasm of breast: Secondary | ICD-10-CM | POA: Diagnosis not present

## 2014-05-16 DIAGNOSIS — Z86010 Personal history of colonic polyps: Secondary | ICD-10-CM | POA: Diagnosis not present

## 2014-05-16 DIAGNOSIS — R933 Abnormal findings on diagnostic imaging of other parts of digestive tract: Secondary | ICD-10-CM | POA: Diagnosis not present

## 2014-05-16 DIAGNOSIS — K5730 Diverticulosis of large intestine without perforation or abscess without bleeding: Secondary | ICD-10-CM | POA: Diagnosis not present

## 2014-05-16 DIAGNOSIS — Q438 Other specified congenital malformations of intestine: Secondary | ICD-10-CM | POA: Diagnosis not present

## 2014-05-25 DIAGNOSIS — B359 Dermatophytosis, unspecified: Secondary | ICD-10-CM | POA: Diagnosis not present

## 2014-05-25 DIAGNOSIS — Z23 Encounter for immunization: Secondary | ICD-10-CM | POA: Diagnosis not present

## 2014-05-31 ENCOUNTER — Ambulatory Visit (INDEPENDENT_AMBULATORY_CARE_PROVIDER_SITE_OTHER): Payer: Medicare Other | Admitting: General Surgery

## 2014-05-31 ENCOUNTER — Encounter (INDEPENDENT_AMBULATORY_CARE_PROVIDER_SITE_OTHER): Payer: Self-pay | Admitting: General Surgery

## 2014-05-31 VITALS — BP 120/72 | HR 72 | Temp 98.7°F | Resp 14 | Ht 65.5 in | Wt 139.8 lb

## 2014-05-31 DIAGNOSIS — C210 Malignant neoplasm of anus, unspecified: Secondary | ICD-10-CM | POA: Diagnosis not present

## 2014-05-31 NOTE — Progress Notes (Signed)
Cindy [name] is a 80 y.o. female who is here for a follow up visit regarding her anal cancer. She is s/p treatment with 1 round of chemotherapy and full dose of radiation. Her last radiation treatment was early De 2014c. She did not complete her chemotherapy due to intolerance. She reports symptoms from radiation (incontinence and burning) are better. She is having some trouble with constipation and irregular bowel movements. She is using prune-juice for this.   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 Staging PET scan 09/23/2013 of a hypermetabolic anal mass and hypermetabolic right inguinal/perineal nodes. Radiation and cycle 1 5-FU/mitomycin C. beginning 10/10/2013.  Radiation completed 11/23/2013, she declined the second cycle of chemotherapy. Question palpable inguinal/pubic lymphadenopathy. The suprapubic palpable lesions appeared hypermetabolic on the PET scan.  Objective:  Filed Vitals:   05/31/14 1656  BP: 120/72  Pulse: 72  Temp: 98.7 F (37.1 C)  Resp: 14    General appearance: alert and cooperative  GI: normal findings: soft, non-tender  Extremities: no palpable inguinal lyphadenopathy   Procedure: Anoscopy  Surgeon: Marcello Moores  Assistant: Cherylann Parr After the risks and benefits were explained, verbal consent was obtained for above procedure  Anesthesia: none  Diagnosis: anal cancer  Findings: R lateral anal canal scar, No signs of residual tumor   Assessment and Plan:  Cindy [name] is a 80 y.o. F with anal SCC. It seems that she has responded well to treatment. I will see her back in 3 months for repeat anoscopy. I think the L suprapubic nodule is stable and can be monitored clinically.     Rosario Adie, [facility], [place]

## 2014-05-31 NOTE — Patient Instructions (Signed)
Return to the office in 3 months 

## 2014-06-08 DIAGNOSIS — H43819 Vitreous degeneration, unspecified eye: Secondary | ICD-10-CM | POA: Diagnosis not present

## 2014-06-08 DIAGNOSIS — H52229 Regular astigmatism, unspecified eye: Secondary | ICD-10-CM | POA: Diagnosis not present

## 2014-06-08 DIAGNOSIS — Z961 Presence of intraocular lens: Secondary | ICD-10-CM | POA: Diagnosis not present

## 2014-06-08 DIAGNOSIS — Z0100 Encounter for examination of eyes and vision without abnormal findings: Secondary | ICD-10-CM | POA: Diagnosis not present

## 2014-06-08 DIAGNOSIS — H524 Presbyopia: Secondary | ICD-10-CM | POA: Diagnosis not present

## 2014-06-14 DIAGNOSIS — E559 Vitamin D deficiency, unspecified: Secondary | ICD-10-CM | POA: Diagnosis not present

## 2014-06-14 DIAGNOSIS — I10 Essential (primary) hypertension: Secondary | ICD-10-CM | POA: Diagnosis not present

## 2014-06-14 DIAGNOSIS — G2581 Restless legs syndrome: Secondary | ICD-10-CM | POA: Diagnosis not present

## 2014-06-14 DIAGNOSIS — G4700 Insomnia, unspecified: Secondary | ICD-10-CM | POA: Diagnosis not present

## 2014-06-14 DIAGNOSIS — K6289 Other specified diseases of anus and rectum: Secondary | ICD-10-CM | POA: Diagnosis not present

## 2014-06-14 DIAGNOSIS — F411 Generalized anxiety disorder: Secondary | ICD-10-CM | POA: Diagnosis not present

## 2014-06-14 DIAGNOSIS — E538 Deficiency of other specified B group vitamins: Secondary | ICD-10-CM | POA: Diagnosis not present

## 2014-07-27 ENCOUNTER — Ambulatory Visit
Admission: RE | Admit: 2014-07-27 | Discharge: 2014-07-27 | Disposition: A | Discharge status: Home / Self Care | Payer: Medicare Other | Source: Ambulatory Visit | Attending: Radiation Oncology | Admitting: Radiation Oncology

## 2014-07-27 ENCOUNTER — Encounter: Payer: Self-pay | Admitting: Radiation Oncology

## 2014-07-27 VITALS — BP 114/79 | HR 68 | Temp 98.3°F | Resp 16 | Ht 65.5 in | Wt 140.2 lb

## 2014-07-27 DIAGNOSIS — C210 Malignant neoplasm of anus, unspecified: Secondary | ICD-10-CM

## 2014-07-27 NOTE — Progress Notes (Signed)
  Radiation Oncology         [phone]) [phone] ________________________________  Name: Cindy [name] MRN: [id number]  Date: 07/27/2014  DOB: [birth year]  Follow-Up Visit Note  CC: Cindy Screws, MD  Cindy Ruff, MD  Diagnosis:   Squamous cell carcinoma of the anal canal  Interval Since Last Radiation:  8 months   Narrative:  The patient returns today for routine follow-up.  The patient states that she is feeling quite well at this time. She complains of some constipation and takes a stool softener for this each day. Some occasional diarrhea but not a major issue. The patient states that she has some increased frequency of urination. She attributes this to some degree by drinking a lot of water. No complaints of pain. Occasional bleeding per rectum associated with hard stools but not otherwise.                             ALLERGIES:  is allergic to morphine and related.  Meds: Current Outpatient Prescriptions  Medication Sig Dispense Refill  . ALPRAZolam (XANAX) 0.25 MG tablet Take 0.25 mg by mouth 3 (three) times daily as needed for anxiety.      Marland Kitchen estrogens, conjugated, (PREMARIN) 0.625 MG tablet Take 0.625 mg by mouth daily.       Marland Kitchen FLUoxetine (PROZAC) 40 MG capsule Take 40 mg by mouth every morning.       Marland Kitchen levothyroxine (SYNTHROID, LEVOTHROID) 88 MCG tablet Take 88 mcg by mouth daily before breakfast.      . Multiple Vitamin (MULTIVITAMIN WITH MINERALS) TABS tablet Take 1 tablet by mouth daily.  30 tablet  3  . NON FORMULARY Take 1 capsule by mouth daily. [facility] stool softner OTC for constipation      . potassium chloride SA (K-DUR,KLOR-CON) 20 MEQ tablet Take 1 tablet (20 mEq total) by mouth daily.  30 tablet  3  . pramipexole (MIRAPEX) 0.125 MG tablet Take 1 tablet (0.125 mg total) by mouth at bedtime.  30 tablet  3  . Telmisartan-HCTZ (MICARDIS HCT PO) Take by mouth.       No current facility-administered medications for this encounter.    Physical Findings: The patient is  in no acute distress. Patient is alert and oriented.  height is 5' 5.5" (1.664 m) and weight is 140 lb 3.2 oz (63.594 kg). Her oral temperature is 98.3 F (36.8 C). Her blood pressure is 114/79 and her pulse is 68. Her respiration is 16 and oxygen saturation is 98%. .   No inguinal lymphadenopathy present On rectal exam, the skin has healed very well. No external lesions. On digital rectal exam no suspicious nodules or masses. No blood on exam glove.  Lab Findings: Lab Results  Component Value Date   WBC 12.4* 12/28/2013   HGB 10.4* 12/28/2013   HCT 31.0* 12/28/2013   MCV 97.5 12/28/2013   PLT 316 12/28/2013     Radiographic Findings: No results found.  Impression:    Clinically the patient is doing well. No suspicious findings today on exam.  Plan:  The patient will return to our clinic in 6 months.  I spent 15 minutes with the patient today, the majority of which was spent counseling the patient on the diagnosis of cancer and coordinating care.   Cindy [name], M.D., Ph.D.

## 2014-07-27 NOTE — Progress Notes (Signed)
Follow up s/p rad txs anal canal, 10/10/13-11/23/13, had an Anoscopy 05/31/14 with Dr. Joyice Faster, said looked good, appetite good, takes stool softner daily for constipation, eats high fiber diet, occasionally has diarrhea, does not take imodium ad though, no c/o pain or nausea, has hard time getting her bowel movements to come, has one about every day , has on rare occasion some blood after BM,drinks plenty water stated , energy level better, 3:55 PM

## 2014-08-31 ENCOUNTER — Ambulatory Visit (HOSPITAL_BASED_OUTPATIENT_CLINIC_OR_DEPARTMENT_OTHER): Payer: Medicare Other | Admitting: Oncology

## 2014-08-31 ENCOUNTER — Other Ambulatory Visit: Payer: Self-pay | Admitting: *Deleted

## 2014-08-31 VITALS — BP 124/55 | HR 76 | Temp 98.4°F | Resp 17 | Ht 65.0 in | Wt 140.2 lb

## 2014-08-31 DIAGNOSIS — T451X5A Adverse effect of antineoplastic and immunosuppressive drugs, initial encounter: Secondary | ICD-10-CM | POA: Diagnosis not present

## 2014-08-31 DIAGNOSIS — D61810 Antineoplastic chemotherapy induced pancytopenia: Secondary | ICD-10-CM | POA: Diagnosis not present

## 2014-08-31 DIAGNOSIS — C210 Malignant neoplasm of anus, unspecified: Secondary | ICD-10-CM | POA: Diagnosis not present

## 2014-08-31 IMAGING — CR DG CHEST 2V
2 series · 2 of 2 positions shown · non-contrast
Comparison: 10/17/2012

CLINICAL DATA: Fever, weakness, hypertension.

EXAM:
CHEST  2 VIEW

[w chest lat]
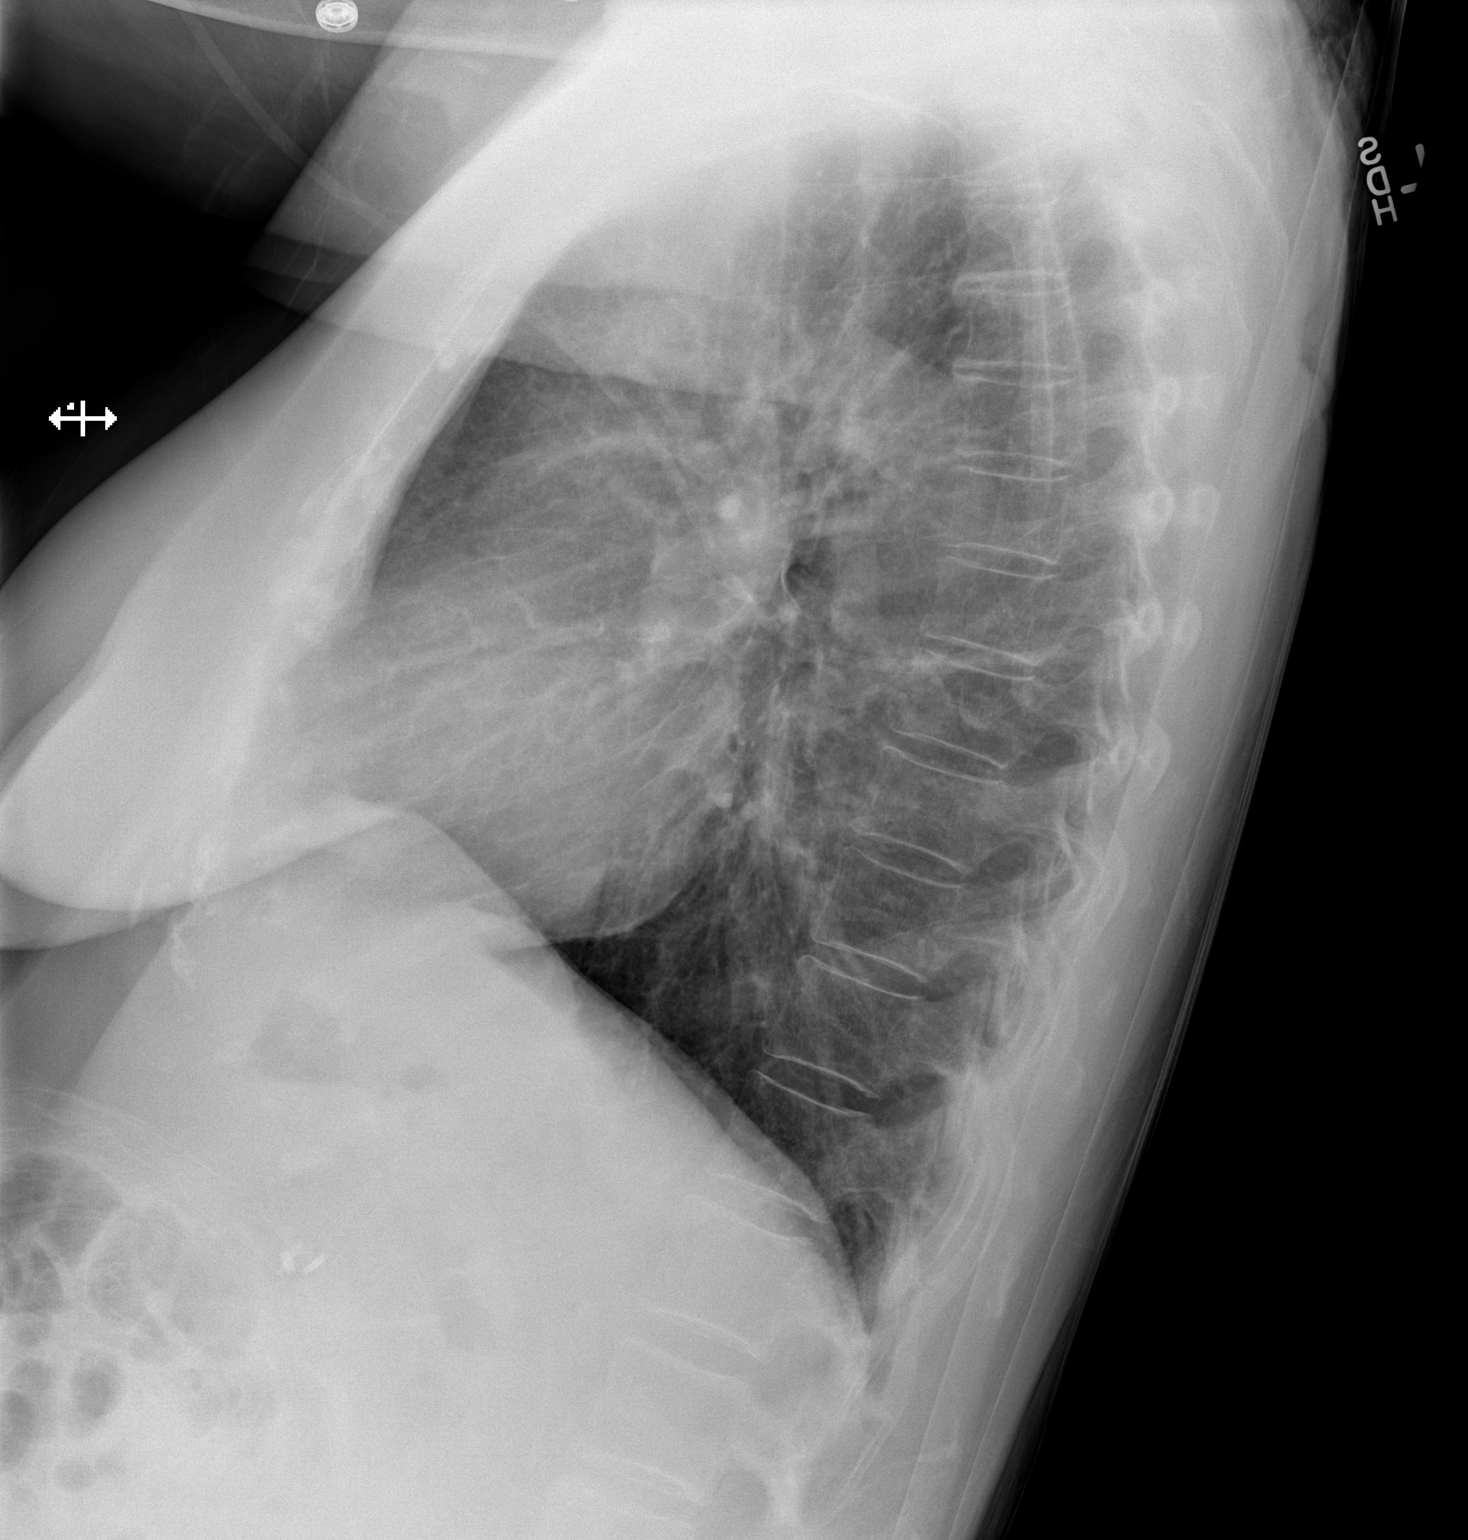

[x chest ap]
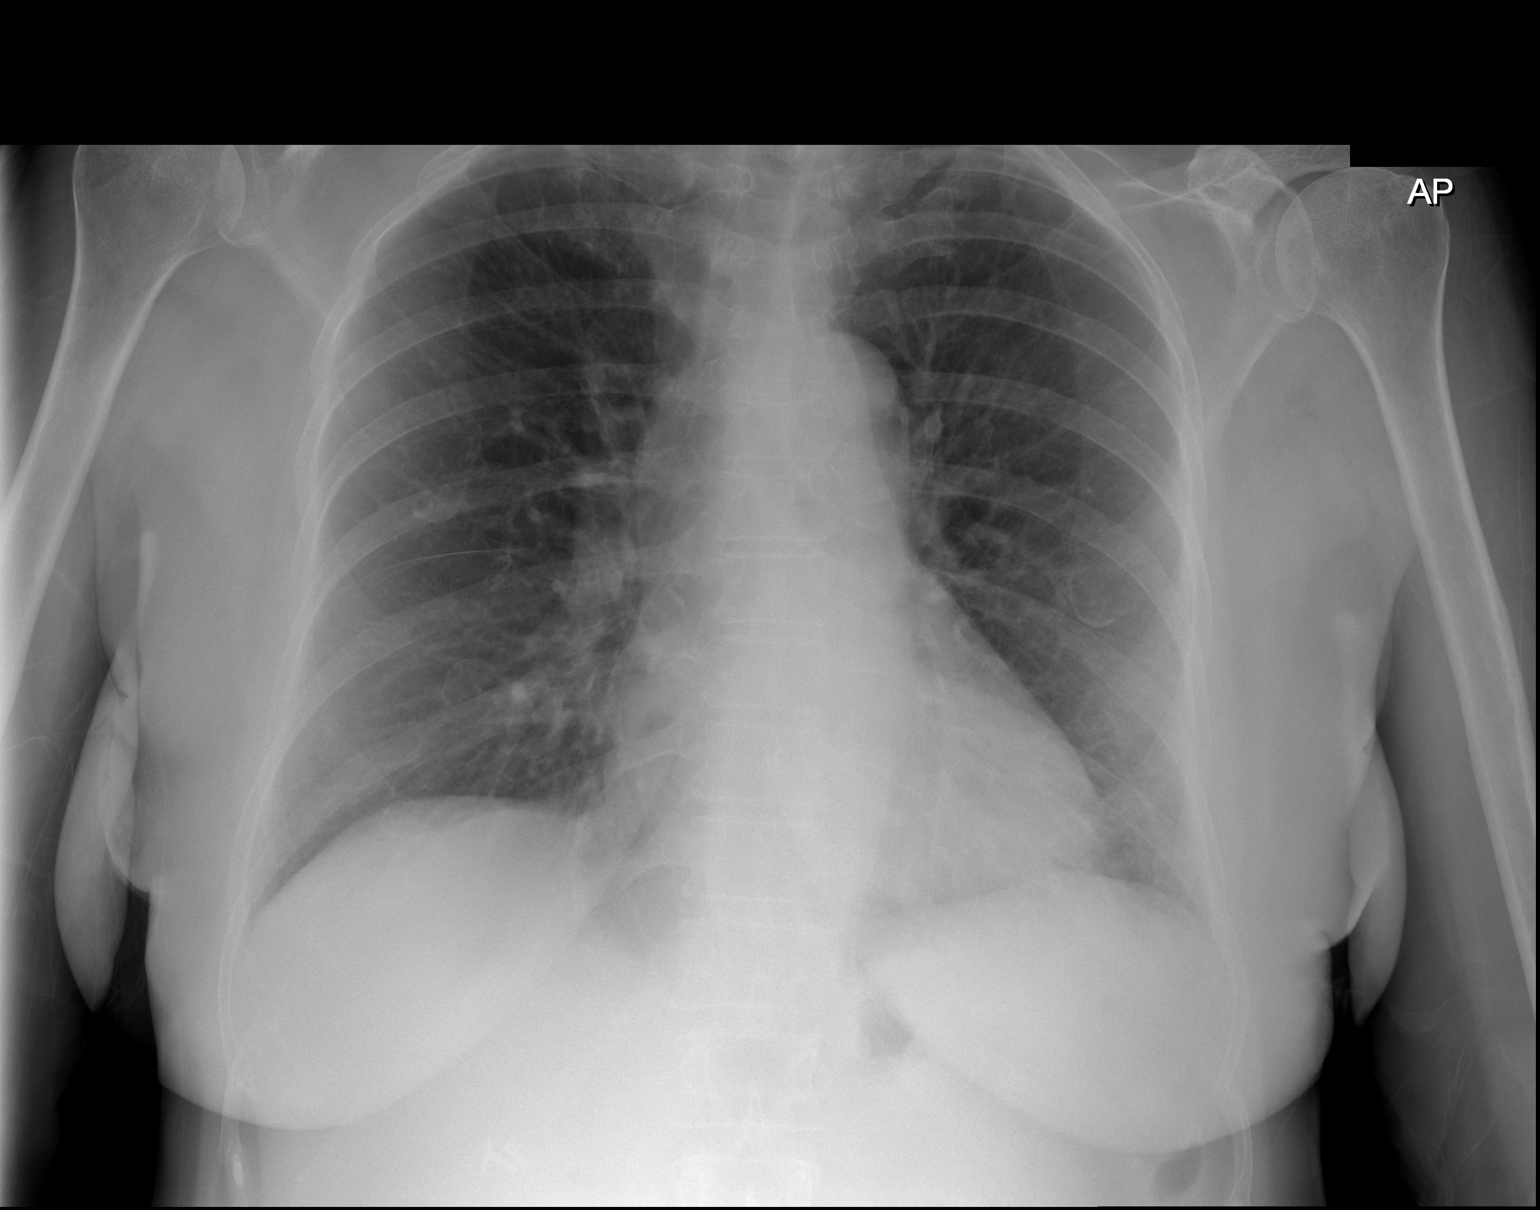

[2 of 2 positions shown; findings below may reference images not displayed]

FINDINGS: The heart size is normal. There is mild perihilar bronchial
thickening. No focal consolidations or pleural effusions. No
pulmonary edema.Surgical clips are identified in the upper abdomen.
There is mild thoracic degenerative change.
IMPRESSION: 1. Bronchitic changes.
2.  No evidence for acute cardiopulmonary abnormality.

## 2014-08-31 NOTE — Progress Notes (Signed)
  [place] OFFICE PROGRESS NOTE   Diagnosis: Anal cancer  INTERVAL HISTORY:   She returns as scheduled. She feels well. No rectal bleeding. She has noted a lesion at the external vagina. She recently had negative rectal exams with doctors Marcello Moores and [place].  Objective:  Vital signs in last 24 hours:  Blood pressure 124/55, pulse 76, temperature 98.4 F (36.9 C), temperature source Oral, resp. rate 17, height 5\' 5"  (1.651 m), SpO2 98.00%.    HEENT: Neck without mass Lymphatics: No cervical, supraclavicular, axillary, or inguinal nodes Resp: Lungs clear bilaterally Cardio: Regular rate and rhythm GI: No hepatomegaly, nontender, no mass. Less than 1/2 cm areas of nodularity in the cutaneous tissue in the suprapubic area bilaterally. No evidence of recurrent tumor at the perineum or external anal verge. There is a skin 10 between the anus and inferior vagina Vascular: No leg edema  Medications: I have reviewed the patient's current medications.  Assessment/Plan: 1. Squamous cell carcinoma of the anus. Staging PET scan 09/23/2013 of a hypermetabolic anal mass and hypermetabolic right inguinal/perineal nodes. Radiation and cycle 1 5-FU/mitomycin C. beginning 10/10/2013. Radiation completed 11/23/2013, she declined the second cycle of chemotherapy. 2. Question palpable inguinal/pubic lymphadenopathy. The suprapubic palpable lesions appeared hypermetabolic on the PET scan. The suprapubic lesions are barely palpable or resolved on exam 08/31/2014 3. History of rectal pain and bleeding secondary to #1.  4. Nausea predating chemotherapy.  5. Thickening/hypermetabolism at the splenic flexure. Question second primary. 6. Mucositis secondary to chemotherapy. Resolved 7. Skin rash likely related to 5-fluorouracil. Resolved 8. Pancytopenia secondary to chemotherapy. Improved. 9. Diarrhea-likely secondary to chemotherapy induced enteritis, C. Difficile negative 10/23/2013.   10. History of hypokalemia secondary to diarrhea 11. Hospitalization with febrile neutropenia 10/23/2013 through    Disposition:  She remains in clinical remission from anal cancer. We will be sure she has completed a colonoscopy. She will return for an office visit in 6 months. She is scheduled to see Dr. Marcello Moores 09/05/2014.  Betsy Coder, MD  08/31/2014  11:21 AM

## 2014-08-31 NOTE — Progress Notes (Signed)
Called Dr. Erlinda Hong office, requested copy of colonoscopy report done this year. Results to Dr. Gearldine Shown desk for review. Canceled referral entered today for colonoscopy.

## 2014-09-01 ENCOUNTER — Telehealth: Payer: Self-pay | Admitting: Oncology

## 2014-09-01 NOTE — Telephone Encounter (Signed)
s.w. pt and advised on March 2016 appt....pt ok adn aware °

## 2014-09-05 ENCOUNTER — Ambulatory Visit (INDEPENDENT_AMBULATORY_CARE_PROVIDER_SITE_OTHER): Payer: Medicare Other | Admitting: General Surgery

## 2014-09-05 DIAGNOSIS — C210 Malignant neoplasm of anus, unspecified: Secondary | ICD-10-CM | POA: Diagnosis not present

## 2014-09-15 ENCOUNTER — Other Ambulatory Visit (HOSPITAL_COMMUNITY): Payer: Self-pay | Admitting: Internal Medicine

## 2014-10-31 ENCOUNTER — Other Ambulatory Visit: Payer: Self-pay | Admitting: Neurosurgery

## 2014-10-31 DIAGNOSIS — I671 Cerebral aneurysm, nonruptured: Secondary | ICD-10-CM

## 2014-11-05 IMAGING — CR DG HAND COMPLETE 3+V*R*
3 series · 3 of 3 positions shown · non-contrast
Comparison: None.

CLINICAL DATA: Right hand pain and swelling.  No injury.

EXAM:
RIGHT HAND - COMPLETE 3+ VIEW

[x hand pa right]
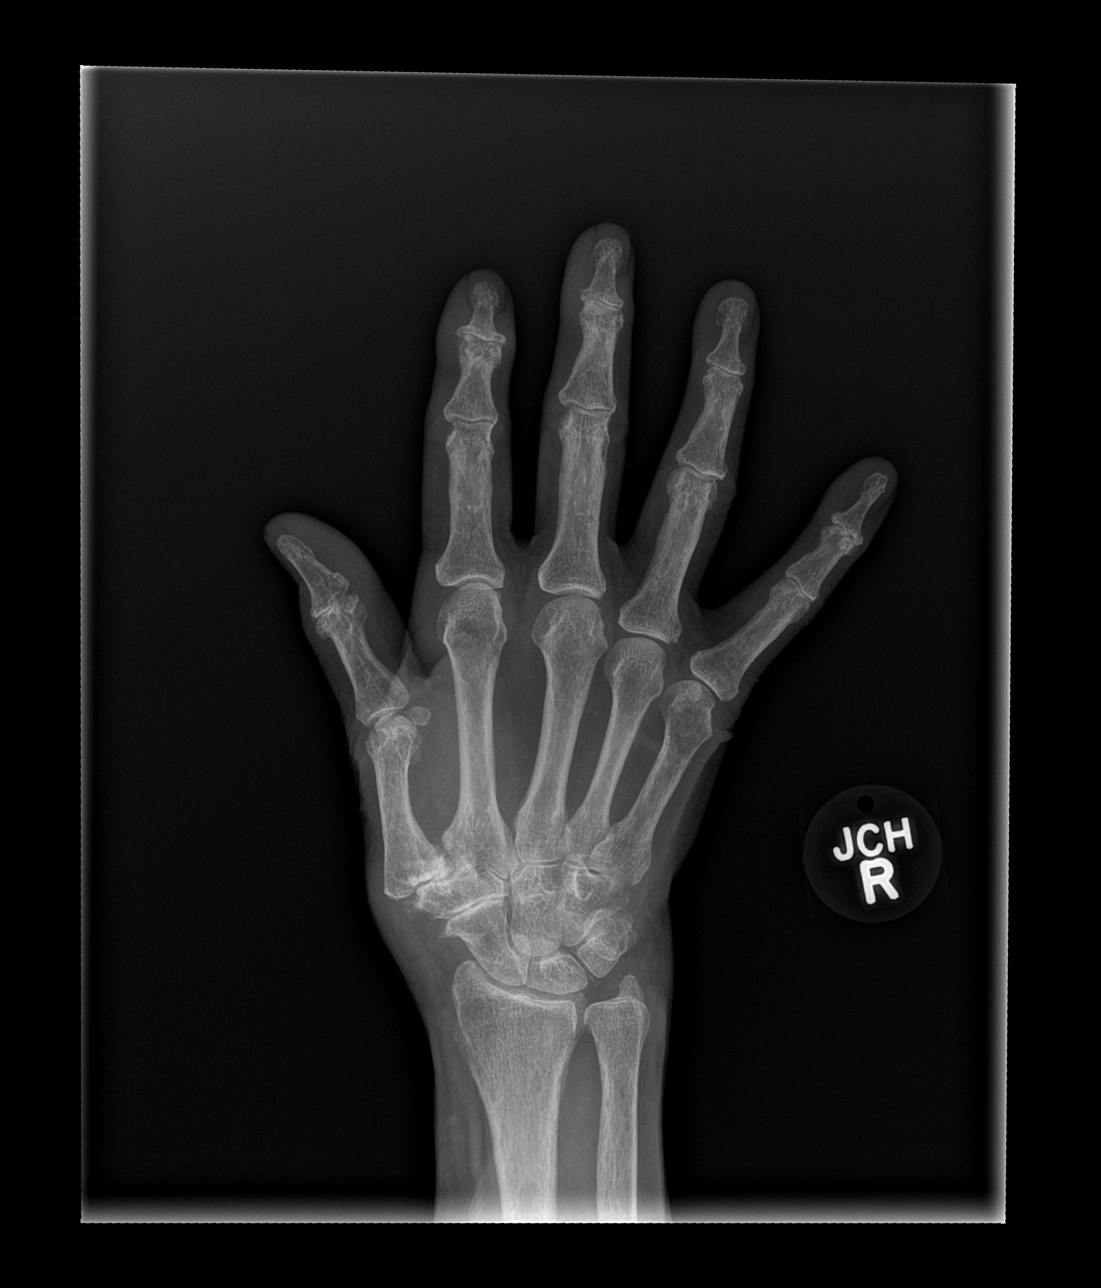

[x hand obl right]
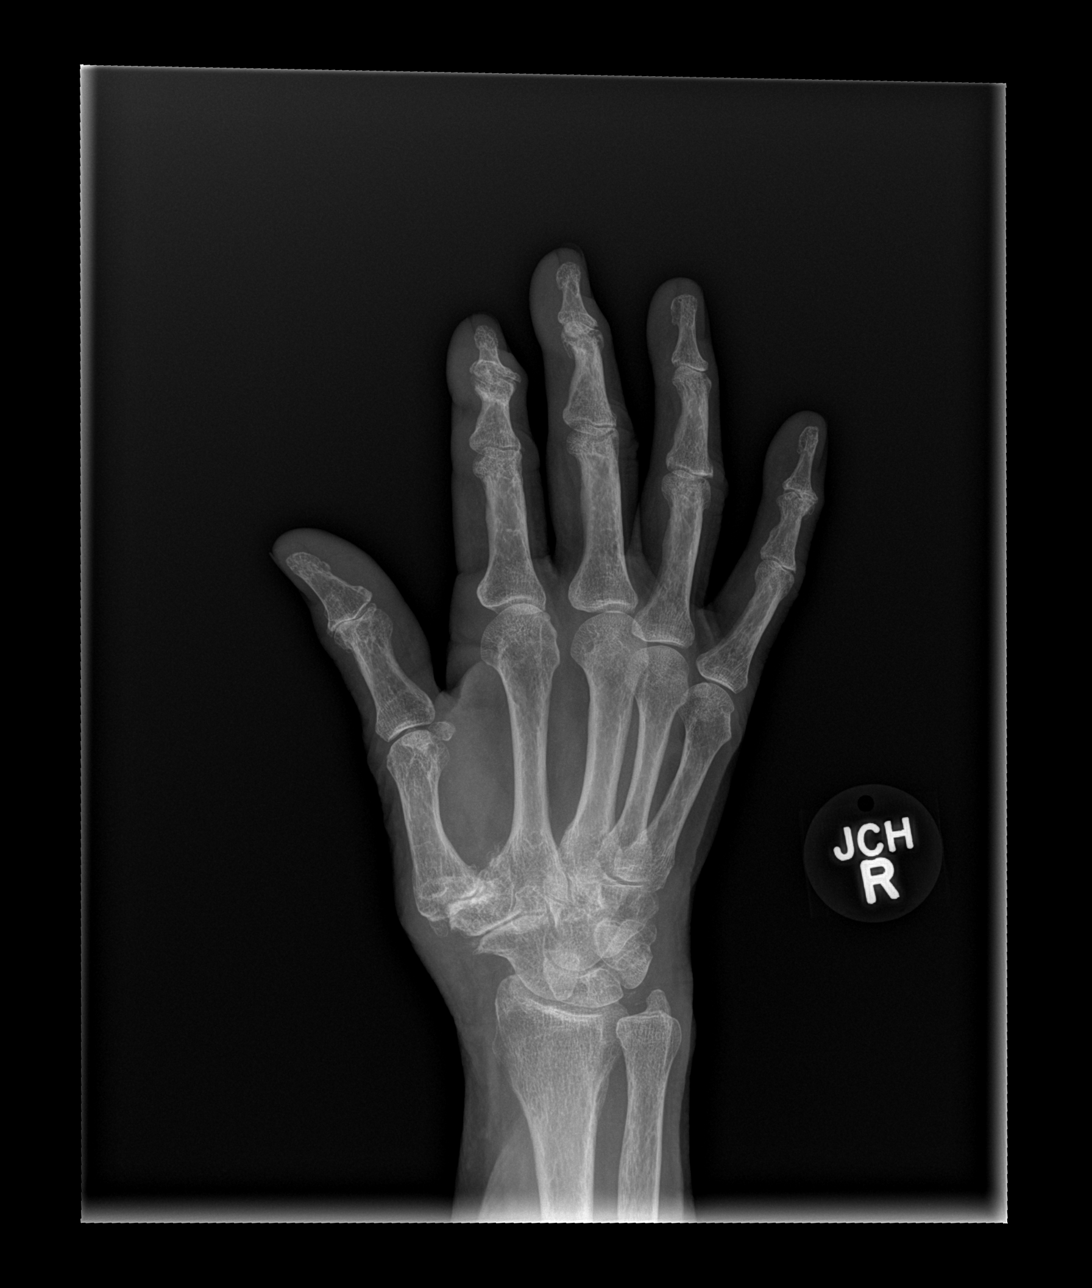

[x hand lat right]
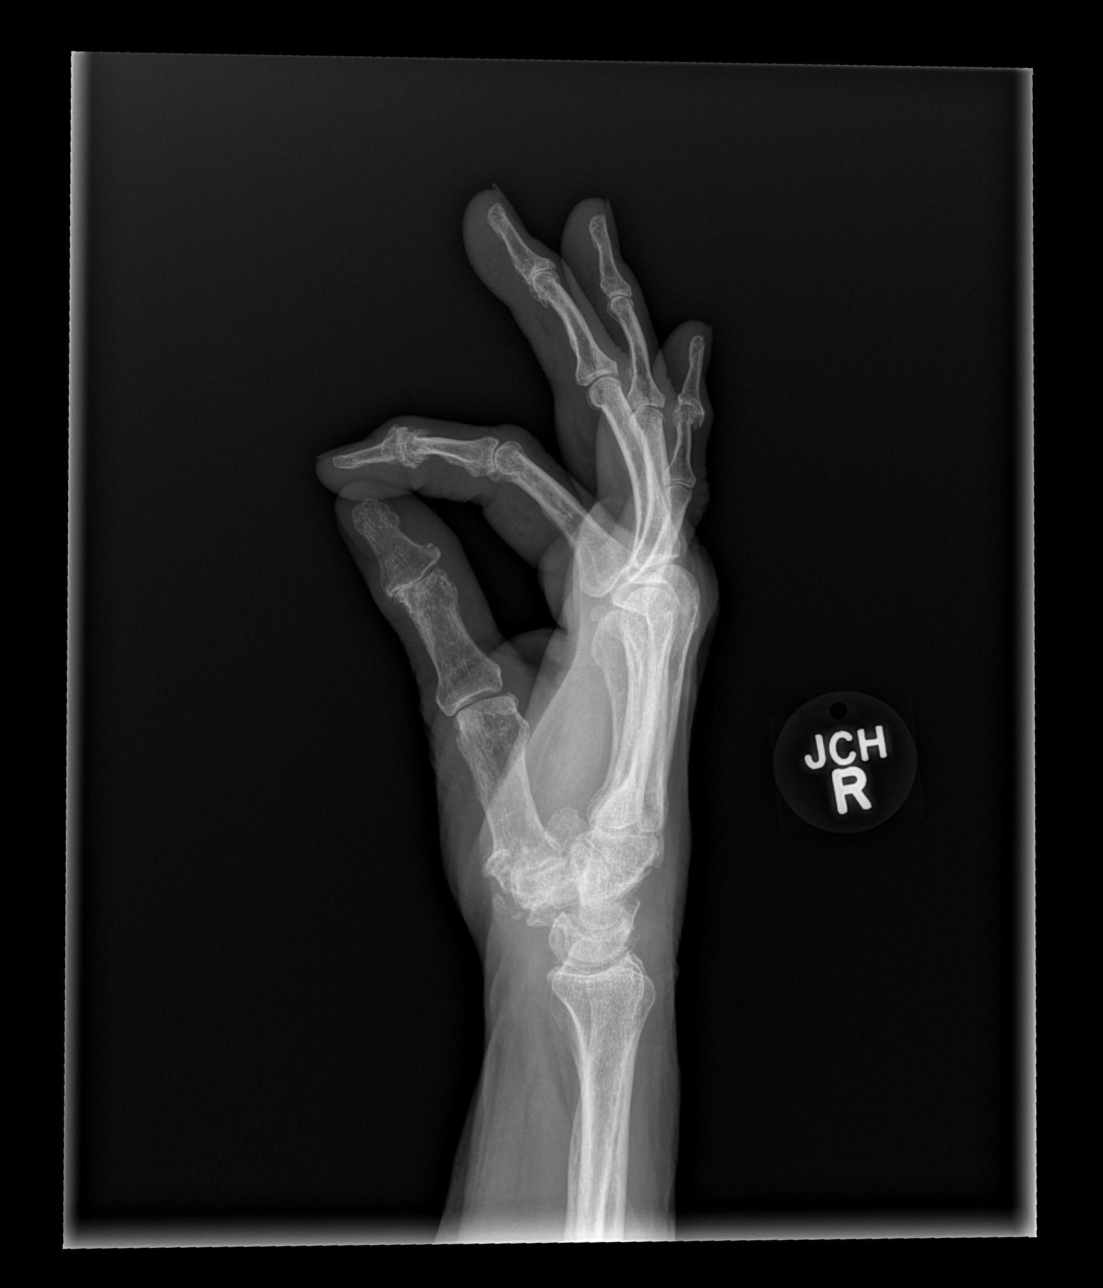

[3 of 3 positions shown; findings below may reference images not displayed]

FINDINGS: Exam demonstrates moderate degenerative changes over the radial side
of the carpal bones and 1st carpometacarpal joint. There are
degenerative changes of the interphalangeal joints most prominent at
the 2nd distal interphalangeal joint. No acute fracture or
dislocation.
IMPRESSION: No acute findings.

Moderate degenerative changes as described.

## 2014-11-07 ENCOUNTER — Other Ambulatory Visit: Payer: Self-pay | Admitting: Neurosurgery

## 2014-11-07 DIAGNOSIS — I671 Cerebral aneurysm, nonruptured: Secondary | ICD-10-CM | POA: Diagnosis not present

## 2014-11-07 LAB — BUN: BUN: 19 mg/dL (ref 6–23)

## 2014-11-07 LAB — CREATININE, SERUM: Creat: 0.9 mg/dL (ref 0.50–1.10)

## 2014-11-08 ENCOUNTER — Ambulatory Visit
Admission: RE | Admit: 2014-11-08 | Discharge: 2014-11-08 | Disposition: A | Discharge status: Home / Self Care | Payer: Medicare Other | Source: Ambulatory Visit | Attending: Neurosurgery | Admitting: Neurosurgery

## 2014-11-08 DIAGNOSIS — I671 Cerebral aneurysm, nonruptured: Secondary | ICD-10-CM

## 2014-11-08 DIAGNOSIS — I720 Aneurysm of carotid artery: Secondary | ICD-10-CM | POA: Diagnosis not present

## 2014-11-08 DIAGNOSIS — I672 Cerebral atherosclerosis: Secondary | ICD-10-CM | POA: Diagnosis not present

## 2014-11-08 MED ORDER — IOHEXOL 350 MG/ML SOLN
65.0000 mL | Freq: Once | INTRAVENOUS | Status: AC | PRN
  Administered 2014-11-08: 65 mL via INTRAVENOUS

## 2014-12-04 DIAGNOSIS — I671 Cerebral aneurysm, nonruptured: Secondary | ICD-10-CM | POA: Diagnosis not present

## 2014-12-04 DIAGNOSIS — I10 Essential (primary) hypertension: Secondary | ICD-10-CM | POA: Diagnosis not present

## 2014-12-13 DIAGNOSIS — G4700 Insomnia, unspecified: Secondary | ICD-10-CM | POA: Diagnosis not present

## 2014-12-13 DIAGNOSIS — F329 Major depressive disorder, single episode, unspecified: Secondary | ICD-10-CM | POA: Diagnosis not present

## 2014-12-13 DIAGNOSIS — R5382 Chronic fatigue, unspecified: Secondary | ICD-10-CM | POA: Diagnosis not present

## 2014-12-13 DIAGNOSIS — E780 Pure hypercholesterolemia: Secondary | ICD-10-CM | POA: Diagnosis not present

## 2014-12-13 DIAGNOSIS — E559 Vitamin D deficiency, unspecified: Secondary | ICD-10-CM | POA: Diagnosis not present

## 2014-12-13 DIAGNOSIS — N959 Unspecified menopausal and perimenopausal disorder: Secondary | ICD-10-CM | POA: Diagnosis not present

## 2014-12-13 DIAGNOSIS — Z23 Encounter for immunization: Secondary | ICD-10-CM | POA: Diagnosis not present

## 2014-12-13 DIAGNOSIS — D81818 Other biotin-dependent carboxylase deficiency: Secondary | ICD-10-CM | POA: Diagnosis not present

## 2014-12-13 DIAGNOSIS — I10 Essential (primary) hypertension: Secondary | ICD-10-CM | POA: Diagnosis not present

## 2015-01-25 ENCOUNTER — Ambulatory Visit
Admission: RE | Admit: 2015-01-25 | Discharge: 2015-01-25 | Disposition: A | Discharge status: Home / Self Care | Payer: Medicare Other | Source: Ambulatory Visit | Attending: Radiation Oncology | Admitting: Radiation Oncology

## 2015-01-25 ENCOUNTER — Encounter: Payer: Self-pay | Admitting: Radiation Oncology

## 2015-01-25 VITALS — BP 173/76 | HR 58 | Temp 97.9°F | Resp 20 | Ht <= 58 in | Wt 146.7 lb

## 2015-01-25 DIAGNOSIS — C210 Malignant neoplasm of anus, unspecified: Secondary | ICD-10-CM | POA: Diagnosis not present

## 2015-01-25 NOTE — Progress Notes (Signed)
Follow up s/p rad txs anal 10/10/13-12/3.14, habing regular formes stools daily unless she eats something not good  Then has diarrhea, no c/o pain or discomfort, no nausea, appetite good, energy good, no bleeding 3:59 PM

## 2015-01-25 NOTE — Progress Notes (Signed)
  Radiation Oncology         (336) [phone] ________________________________  Name: Cindy [name] MRN: [id number]  Date: 01/25/2015  DOB: 04-18-1930  Follow-Up Visit Note  CC: Henrine Screws, MD  Leighton Ruff, MD  Diagnosis:    Anal cancer, squamous cell carcinoma  Interval Since Last Radiation:  The patient completed radiation treatment on 11/23/2013   Narrative:  The patient returns today for routine follow-up.  The patient states that she is doing well at this time. She continues to have regular bowel movements. No bleeding and no pelvic pain. No discomfort or diarrhea. Overall she feels that she is doing well and has no new complaints today.                              ALLERGIES:  is allergic to morphine and related.  Meds: Current Outpatient Prescriptions  Medication Sig Dispense Refill  . ALPRAZolam (XANAX) 0.25 MG tablet Take 0.25 mg by mouth 3 (three) times daily as needed for anxiety.    Marland Kitchen estrogens, conjugated, (PREMARIN) 0.625 MG tablet Take 0.625 mg by mouth daily.     Marland Kitchen FLUoxetine (PROZAC) 40 MG capsule Take 40 mg by mouth every morning.     Marland Kitchen levothyroxine (SYNTHROID, LEVOTHROID) 88 MCG tablet Take 88 mcg by mouth daily before breakfast.    . Multiple Vitamin (MULTIVITAMIN WITH MINERALS) TABS tablet Take 1 tablet by mouth daily. 30 tablet 3  . NON FORMULARY Take 1 capsule by mouth daily. [facility] stool softner OTC for constipation    . potassium chloride SA (K-DUR,KLOR-CON) 20 MEQ tablet Take 1 tablet (20 mEq total) by mouth daily. 30 tablet 3  . Telmisartan-HCTZ (MICARDIS HCT PO) Take 1 capsule by mouth daily.      No current facility-administered medications for this encounter.    Physical Findings: The patient is in no acute distress. Patient is alert and oriented.  height is 4\' 7"  (1.397 m) and weight is 146 lb 11.2 oz (66.543 kg). Her oral temperature is 97.9 F (36.6 C). Her blood pressure is 173/76 and her pulse is 58. Her respiration is 20. Marland Kitchen   Alert, in  no acute distress No inguinal lymphadenopathy. Persistent suprapubic lymphadenopathy which appears unchanged to me on exam today. External hemorrhoids present in the anal region. No worrisome lesions. On digital rectal exam, no nodularity or suspicious areas. No blood on exam glove.  Lab Findings: Lab Results  Component Value Date   WBC 12.4* 12/28/2013   HGB 10.4* 12/28/2013   HCT 31.0* 12/28/2013   MCV 97.5 12/28/2013   PLT 316 12/28/2013     Radiographic Findings: No results found.  Impression:    The patient clinically remains NED at this time. She had suprapubic lymphadenopathy of uncertain significance at the time of diagnosis but this has remained stable.  Plan:  The patient will follow-up in our clinic in 6 months. Based on her notes, the patient was supposed to be seen by Dr. Marcello Moores last fall but she states that she did not see her then. We will put in a request for follow-up with her with anoscopy.  I spent 15 minutes with the patient today, the majority of which was spent counseling the patient on the diagnosis of cancer and coordinating care.   Jodelle Gross, M.D., Ph.D.

## 2015-02-19 DIAGNOSIS — C210 Malignant neoplasm of anus, unspecified: Secondary | ICD-10-CM | POA: Diagnosis not present

## 2015-03-01 ENCOUNTER — Ambulatory Visit: Payer: Medicare Other | Admitting: Nurse Practitioner

## 2015-03-29 ENCOUNTER — Other Ambulatory Visit: Payer: Self-pay | Admitting: *Deleted

## 2015-03-29 DIAGNOSIS — C210 Malignant neoplasm of anus, unspecified: Secondary | ICD-10-CM | POA: Diagnosis not present

## 2015-04-09 ENCOUNTER — Telehealth: Payer: Self-pay | Admitting: Nurse Practitioner

## 2015-04-09 NOTE — Telephone Encounter (Signed)
Patient moved appointment due to leaving out of town 04/19. Reschedule to 04/29 patient confirmed.

## 2015-04-10 ENCOUNTER — Ambulatory Visit: Payer: Medicare Other | Admitting: Nurse Practitioner

## 2015-04-20 ENCOUNTER — Telehealth: Payer: Self-pay | Admitting: Oncology

## 2015-04-20 ENCOUNTER — Ambulatory Visit (HOSPITAL_BASED_OUTPATIENT_CLINIC_OR_DEPARTMENT_OTHER): Payer: Medicare Other | Admitting: Nurse Practitioner

## 2015-04-20 ENCOUNTER — Ambulatory Visit: Payer: Medicare Other | Admitting: Nurse Practitioner

## 2015-04-20 VITALS — BP 150/67 | HR 66 | Temp 98.4°F | Resp 18 | Ht <= 58 in | Wt 154.1 lb

## 2015-04-20 DIAGNOSIS — Z85048 Personal history of other malignant neoplasm of rectum, rectosigmoid junction, and anus: Secondary | ICD-10-CM

## 2015-04-20 DIAGNOSIS — C210 Malignant neoplasm of anus, unspecified: Secondary | ICD-10-CM

## 2015-04-20 NOTE — Telephone Encounter (Signed)
Gave avs & calednar for Janruary.

## 2015-04-20 NOTE — Progress Notes (Signed)
[place] OFFICE PROGRESS NOTE   Diagnosis:   Anal cancer  INTERVAL HISTORY:   Cindy [name] returns for scheduled follow-up.  No change in baseline bowel habits. She estimates one loose stool a week. Her daughter thinks the loose stools are occurring more frequently. No rectal pain or bleeding.  She reports seeing Dr. Marcello Moores 3 weeks ago and having a negative rectal exam. Her main concern today is a decline in memory, specifically short-term, since completing chemotherapy. No unusual headaches or vision change.  Cindy [name] daughter reports she and her 2 year old daughter have been found to have the BRCA2 mutation.   Objective:  Vital signs in last 24 hours:  Blood pressure 150/67, pulse 66, temperature 98.4 F (36.9 C), temperature source Oral, resp. rate 18, height $RemoveBe'4\' 7"'bGHQAupea$  (1.397 m), weight 154 lb 1.6 oz (69.899 kg), SpO2 99 %.    HEENT:  No thrush or ulcers. Lymphatics:  No palpable cervical, supra clavicular, axillary or inguinal lymph nodes. Resp:  Lungs clear bilaterally. Cardio:  Regular rate and rhythm. GI:  Abdomen soft and nontender. No hepatomegaly. No nodularity at the perineum or anal verge. No rectal mass. Skin tag between the anus and inferior vagina. Vascular:  No leg edema. Neuro:  Alert and oriented. Answers questions appropriately. Follows commands.     Lab Results:  Lab Results  Component Value Date   WBC 12.4* 12/28/2013   HGB 10.4* 12/28/2013   HCT 31.0* 12/28/2013   MCV 97.5 12/28/2013   PLT 316 12/28/2013   NEUTROABS 5.7 12/28/2013    Imaging:  No results found.  Medications: I have reviewed the patient's current medications.  Assessment/Plan: 1. Squamous cell carcinoma of the anus. Staging PET scan 09/23/2013 of a hypermetabolic anal mass and hypermetabolic right inguinal/perineal nodes. Radiation and cycle 1 5-FU/mitomycin C. beginning 10/10/2013.  Radiation completed 11/23/2013, she declined the second cycle of  chemotherapy. 2. Question palpable inguinal/pubic lymphadenopathy. The suprapubic palpable lesions appeared hypermetabolic on the PET scan. The suprapubic lesions were barely palpable or resolved on exam 08/31/2014.  Not detected on exam 04/20/2015. 3. History of rectal pain and bleeding secondary to #1.  4. Nausea predating chemotherapy.  5. Thickening/hypermetabolism at the splenic flexure. Question second primary. 6. Mucositis secondary to chemotherapy. Resolved 7. Skin rash likely related to 5-fluorouracil. Resolved 8. Pancytopenia secondary to chemotherapy. Improved. 9. Diarrhea-likely secondary to chemotherapy induced enteritis, C. Difficile negative 10/23/2013.  10. History of hypokalemia secondary to diarrhea 11. Hospitalization with febrile neutropenia 10/23/2013 through 10/27/2013 12. Colonoscopy 05/16/2014 with diverticulosis in the sigmoid colon and descending colon; tortuous colon; distal rectum and anal verge normal.   Disposition: Cindy [name] remains in clinical remission from anal cancer. She will continue follow up with Drs. Marcello Moores and [place]. We scheduled a return visit here in 9 months.  We discussed the BRCA2 mutation Cindy [name]'s daughter and granddaughter have been confirmed to have. We offered a referral to Cindy [name] to the genetics clinic. She does not wish to meet with a genetics counselor or undergo testing. She will let us know if she changes her mind.  We discussed the memory issues. Dr. Benay Spice feels it is unlikely the decline in memory is related to the chemotherapy she received for treatment of anal cancer. We recommended she discuss further with her PCP.  Patient seen with Dr. Benay Spice. 25 minutes were spent face-to-face at today's visit with the majority of that time involved in counseling/coordination of care.  Ned Card ANP/GNP-BC   04/20/2015  3:41 PM  This was a shared visit with Ned Card. I recommended she follow-up with her  primary physician to evaluate the memory issues and intermittent diarrhea. I doubt these symptoms are related to her treatment for anal cancer.  Julieanne Manson, M.D.

## 2015-05-18 DIAGNOSIS — H02834 Dermatochalasis of left upper eyelid: Secondary | ICD-10-CM | POA: Diagnosis not present

## 2015-06-17 DIAGNOSIS — H1011 Acute atopic conjunctivitis, right eye: Secondary | ICD-10-CM | POA: Diagnosis not present

## 2015-06-19 DIAGNOSIS — H1031 Unspecified acute conjunctivitis, right eye: Secondary | ICD-10-CM | POA: Diagnosis not present

## 2015-07-12 DIAGNOSIS — J069 Acute upper respiratory infection, unspecified: Secondary | ICD-10-CM | POA: Diagnosis not present

## 2015-07-26 ENCOUNTER — Ambulatory Visit: Payer: Medicare Other | Admitting: Radiation Oncology

## 2015-07-27 ENCOUNTER — Ambulatory Visit: Payer: Medicare Other | Admitting: Radiation Oncology

## 2015-08-08 ENCOUNTER — Telehealth: Payer: Self-pay | Admitting: *Deleted

## 2015-08-08 ENCOUNTER — Encounter: Payer: Self-pay | Admitting: Radiation Oncology

## 2015-08-08 ENCOUNTER — Ambulatory Visit
Admission: RE | Admit: 2015-08-08 | Discharge: 2015-08-08 | Disposition: A | Discharge status: Home / Self Care | Payer: Medicare Other | Source: Ambulatory Visit | Attending: Radiation Oncology | Admitting: Radiation Oncology

## 2015-08-08 VITALS — BP 149/59 | HR 73 | Temp 98.1°F | Resp 20 | Ht 66.0 in | Wt 153.7 lb

## 2015-08-08 DIAGNOSIS — C210 Malignant neoplasm of anus, unspecified: Secondary | ICD-10-CM | POA: Diagnosis not present

## 2015-08-08 NOTE — Progress Notes (Addendum)
Follow up s/p rad tx anal cancer 2014  PAIN: None   BOWEL: Normal until she eats spicy foods then she has duiarrhea   OTHER: Pt complains tired at times BP 149/59 mmHg  Pulse 73  Temp(Src) 98.1 F (36.7 C) (Oral)  Resp 20  Ht 5\' 6"  (1.676 m)  Wt 153 lb 11.2 oz (69.718 kg)  BMI 24.82 kg/m2 Wt Readings from Last 3 Encounters:  08/08/15 153 lb 11.2 oz (69.718 kg)  04/20/15 154 lb 1.6 oz (69.899 kg)  01/25/15 146 lb 11.2 oz (66.543 kg)   BP 149/59 mmHg  Pulse 73  Temp(Src) 98.1 F (36.7 C) (Oral)  Resp 20  Ht 5\' 6"  (1.676 m)  Wt 153 lb 11.2 oz (69.718 kg)  BMI 24.82 kg/m2

## 2015-08-08 NOTE — Progress Notes (Signed)
  Radiation Oncology         [phone] ________________________________  Name: Cindy [name] MRN: [id number]  Date: 08/08/2015  DOB: May 06, 1930  Follow-Up Visit Note  CC: Henrine Screws, MD  Josetta Huddle, MD  Diagnosis: Anal Cancer, squamous cell carcinoma  Interval Since Last Radiation:  The patient completed radiation treatment on 11/23/2013  Narrative:  The patient returns today for routine follow-up.  The patient states that she is doing well at this time. However, she reports some fatigue. She continues to have regular bowel movements. However, she has diarrhea when she consumes spicy food. She denies bleeding and pain.  ALLERGIES:  is allergic to morphine and related.  Meds: Current Outpatient Prescriptions  Medication Sig Dispense Refill  . estrogens, conjugated, (PREMARIN) 0.625 MG tablet Take 0.625 mg by mouth daily.     Marland Kitchen FLUoxetine (PROZAC) 40 MG capsule Take 40 mg by mouth every morning.     . Ibuprofen-Diphenhydramine HCl (ADVIL PM) 200-25 MG CAPS Take 1 capsule by mouth as needed.    Marland Kitchen levothyroxine (SYNTHROID, LEVOTHROID) 88 MCG tablet Take 88 mcg by mouth daily before breakfast.    . Multiple Vitamin (MULTIVITAMIN WITH MINERALS) TABS tablet Take 1 tablet by mouth daily. 30 tablet 3  . NON FORMULARY Take 1 capsule by mouth daily. [facility] stool softner OTC for constipation    . potassium chloride SA (K-DUR,KLOR-CON) 20 MEQ tablet Take 1 tablet (20 mEq total) by mouth daily. 30 tablet 3  . Telmisartan-HCTZ (MICARDIS HCT PO) Take 1 capsule by mouth daily.     Marland Kitchen ALPRAZolam (XANAX) 0.25 MG tablet Take 0.25 mg by mouth 3 (three) times daily as needed for anxiety.     No current facility-administered medications for this encounter.    Physical Findings: The patient is in no acute distress. Patient is alert and oriented.  height is 5\' 6"  (1.676 m) and weight is 153 lb 11.2 oz (69.718 kg). Her oral temperature is 98.1 F (36.7 C). Her blood pressure is 149/59 and her  pulse is 73. Her respiration is 20.  No inguinal lymphadenopathy. No anal lesion and no suspicious mass on digital rectal exam. No bleeding.  Lab Findings: Lab Results  Component Value Date   WBC 12.4* 12/28/2013   HGB 10.4* 12/28/2013   HCT 31.0* 12/28/2013   MCV 97.5 12/28/2013   PLT 316 12/28/2013     Radiographic Findings: No results found.  Impression: The patient clinically remains NED at this time  Plan:  The patient will follow-up in our clinic in 7 months. She is scheduled to see medical oncology early next year.  I spent 15 minutes with the patient today, the majority of which was spent counseling the patient on the diagnosis of cancer and coordinating care.  This document serves as a record of services personally performed by Kyung Rudd, MD. It was created on his behalf by Darcus Austin, a trained medical scribe. The creation of this record is based on the scribe's personal observations and the provider's statements to them. This document has been checked and approved by the attending provider.     Jodelle Gross, M.D., Ph.D.

## 2015-08-08 NOTE — Telephone Encounter (Signed)
Called to see if patient was on their way for her 145pm appt with Dr. Lisbeth Renshaw, no answer left vm to call if unable to keep appt 1:50 PM

## 2015-08-15 DIAGNOSIS — Z1231 Encounter for screening mammogram for malignant neoplasm of breast: Secondary | ICD-10-CM | POA: Diagnosis not present

## 2015-08-15 DIAGNOSIS — Z803 Family history of malignant neoplasm of breast: Secondary | ICD-10-CM | POA: Diagnosis not present

## 2015-08-28 DIAGNOSIS — C44629 Squamous cell carcinoma of skin of left upper limb, including shoulder: Secondary | ICD-10-CM | POA: Diagnosis not present

## 2015-08-28 DIAGNOSIS — L820 Inflamed seborrheic keratosis: Secondary | ICD-10-CM | POA: Diagnosis not present

## 2015-08-28 DIAGNOSIS — D485 Neoplasm of uncertain behavior of skin: Secondary | ICD-10-CM | POA: Diagnosis not present

## 2015-10-23 DIAGNOSIS — Z23 Encounter for immunization: Secondary | ICD-10-CM | POA: Diagnosis not present

## 2016-01-14 ENCOUNTER — Ambulatory Visit (HOSPITAL_BASED_OUTPATIENT_CLINIC_OR_DEPARTMENT_OTHER): Payer: Medicare Other | Admitting: Oncology

## 2016-01-14 ENCOUNTER — Telehealth: Payer: Self-pay | Admitting: Oncology

## 2016-01-14 VITALS — BP 146/81 | HR 90 | Temp 98.6°F | Resp 18 | Ht 66.0 in | Wt 153.6 lb

## 2016-01-14 DIAGNOSIS — Z85048 Personal history of other malignant neoplasm of rectum, rectosigmoid junction, and anus: Secondary | ICD-10-CM | POA: Diagnosis not present

## 2016-01-14 DIAGNOSIS — D61810 Antineoplastic chemotherapy induced pancytopenia: Secondary | ICD-10-CM

## 2016-01-14 DIAGNOSIS — C210 Malignant neoplasm of anus, unspecified: Secondary | ICD-10-CM

## 2016-01-14 NOTE — Telephone Encounter (Signed)
Pt confirmed appt and received avs °

## 2016-01-14 NOTE — Progress Notes (Signed)
  [facility] OFFICE PROGRESS NOTE   Diagnosis: Anal cancer  INTERVAL HISTORY:   Ms. Wanser returns as scheduled. She reports a poor appetite, but she is not losing weight. She has diarrhea approximately 2 times per week. She complains of intermittent "left hip "discomfort. No rectal bleeding. No palpable mass.  Objective:  Vital signs in last 24 hours:  Blood pressure 146/81, pulse 90, temperature 98.6 F (37 C), temperature source Oral, resp. rate 18, height 5\' 6"  (1.676 m), weight 153 lb 9.6 oz (69.673 kg), SpO2 98 %.    HEENT: Neck without mass Lymphatics: No cervical, supra-clavicular, axillary, or inguinal nodes Resp: Lungs clear bilaterally Cardio: Regular rate and rhythm GI: No hepatomegaly, no mass, nontender. Slight nodularity in the subcutaneous tissue lateral to the lower aspect of the midline incision without discrete palpable masses Vascular: No leg edema Rectal: Slight irregularity at the anterior anal canal without a mass. Soft anterior external hemorrhoid  Musculoskeletal: No pain with motion at the left hip. No tenderness at the left gluteus or ischium     Lab Results:  Lab Results  Component Value Date   WBC 12.4* 12/28/2013   HGB 10.4* 12/28/2013   HCT 31.0* 12/28/2013   MCV 97.5 12/28/2013   PLT 316 12/28/2013   NEUTROABS 5.7 12/28/2013     Medications: I have reviewed the patient's current medications.  Assessment/Plan: 1. Squamous cell carcinoma of the anus. Staging PET scan 09/23/2013 of a hypermetabolic anal mass and hypermetabolic right inguinal/perineal nodes. Radiation and cycle 1 5-FU/mitomycin C. beginning 10/10/2013.  Radiation completed 11/23/2013, she declined the second cycle of chemotherapy. 2. Question palpable inguinal/pubic lymphadenopathy. The suprapubic palpable lesions appeared hypermetabolic on the PET scan. The suprapubic lesions were barely palpable or resolved on exam 08/31/2014. Not detected on exam  04/20/2015. 3. History of rectal pain and bleeding secondary to #1.  4. Nausea predating chemotherapy.  5. Thickening/hypermetabolism at the splenic flexure. Question second primary. 6. Mucositis secondary to chemotherapy. Resolved 7. Skin rash likely related to 5-fluorouracil. Resolved 8. Pancytopenia secondary to chemotherapy. Improved. 9. Diarrhea-likely secondary to chemotherapy induced enteritis, C. Difficile negative 10/23/2013.  10. History of hypokalemia secondary to diarrhea 11. Hospitalization with febrile neutropenia 10/23/2013 through 10/27/2013 12. Colonoscopy 05/16/2014 with diverticulosis in the sigmoid colon and descending colon; tortuous colon; distal rectum and anal verge normal.    Disposition:  Ms. Bodle remains in clinical remission from anal cancer. She is scheduled to see Dr. Lisbeth Renshaw in March. We will refer her to Dr. Marcello Moores for a surveillance examination.  She will return for an office visit here in 8 months.   Betsy Coder, MD  01/14/2016  3:29 PM

## 2016-01-17 ENCOUNTER — Telehealth: Payer: Self-pay | Admitting: Oncology

## 2016-01-17 NOTE — Telephone Encounter (Signed)
Faxed over referral to [facility] for Dr. Marcello Moores [phone] release id)

## 2016-03-19 ENCOUNTER — Ambulatory Visit
Admission: RE | Admit: 2016-03-19 | Discharge: 2016-03-19 | Disposition: A | Discharge status: Home / Self Care | Payer: Medicare Other | Source: Ambulatory Visit | Attending: Radiation Oncology | Admitting: Radiation Oncology

## 2016-03-19 ENCOUNTER — Encounter: Payer: Self-pay | Admitting: Radiation Oncology

## 2016-03-19 VITALS — BP 155/71 | HR 82 | Temp 98.5°F | Ht 66.0 in | Wt 150.7 lb

## 2016-03-19 DIAGNOSIS — C210 Malignant neoplasm of anus, unspecified: Secondary | ICD-10-CM

## 2016-03-19 NOTE — Progress Notes (Signed)
Radiation Oncology         ([phone]) [phone] ________________________________  Name: Cindy [name] MRN: [id number]  Date: 03/19/2016  DOB: 03-19-1930  Follow-Up Visit Note  CC: Henrine Screws, MD  Leighton Ruff, MD  Diagnosis: Anal Cancer, squamous cell carcinoma  Interval Since Last Radiation: 2 years and 4 months  10/10/2013 through 11/23/2013: The patient was treated to the anal tumor as well as the regional lymph nodes. The primary tumor received 54 gray at 1.8 gray per fraction. The patient's treatment consisted of a IMRT technique with daily image guidance.  Narrative:  The patient returns today for routine follow-up, and has been in surveillance since she completed her treatment in 2014.  On review of systems, the patient reports that she is doing extremely well overall. She denies any rectal bleeding, anal pain, itching, or burning. She states that she is not experiencing any bowel dysfunction, and is not noting any urinary disturbances. She denies any fevers or chills, unintended weight changes, hot flashes or night sweats. She denies any abdominal pain, nausea, vomiting, chest pain or shortness of breath. A complete review of systems is obtained and is otherwise negative.  ALLERGIES:  is allergic to morphine and related.  Meds: Current Outpatient Prescriptions  Medication Sig Dispense Refill  . ALPRAZolam (XANAX) 0.25 MG tablet Take 0.25 mg by mouth 3 (three) times daily as needed for anxiety.    Marland Kitchen estrogens, conjugated, (PREMARIN) 0.625 MG tablet Take 0.625 mg by mouth daily.     Marland Kitchen FLUoxetine (PROZAC) 40 MG capsule Take 40 mg by mouth every morning.     . Ibuprofen-Diphenhydramine HCl (ADVIL PM) 200-25 MG CAPS Take 1 capsule by mouth as needed.    Marland Kitchen levothyroxine (SYNTHROID, LEVOTHROID) 88 MCG tablet Take 88 mcg by mouth daily before breakfast.    . Multiple Vitamin (MULTIVITAMIN WITH MINERALS) TABS tablet Take 1 tablet by mouth daily. 30 tablet 3  . NON FORMULARY Take  1 capsule by mouth daily. [facility] stool softner OTC for constipation    . potassium chloride SA (K-DUR,KLOR-CON) 20 MEQ tablet Take 1 tablet (20 mEq total) by mouth daily. 30 tablet 3  . Telmisartan-HCTZ (MICARDIS HCT PO) Take 1 capsule by mouth daily.      No current facility-administered medications for this encounter.    Physical Findings:  height is 5\' 6"  (1.676 m) and weight is 150 lb 11.2 oz (68.357 kg). Her temperature is 98.5 F (36.9 C). Her blood pressure is 155/71 and her pulse is 82.  In general this is a well appearing Caucasian female in no acute distress. She is alert and oriented x4 and appropriate throughout the examination. HEENT reveals that the patient is normocephalic, atraumatic. EOMs are intact. PERRLA. Skin is intact without any evidence of gross lesions. Cardiovascular exam reveals a regular rate and rhythm, no clicks rubs or murmurs are auscultated. Chest is clear to auscultation bilaterally. Lymphatic assessment is performed and does not reveal any adenopathy in the cervical, supraclavicular, axillary, or inguinal chains. Abdomen has active bowel sounds in all quadrants and is intact. The abdomen is soft, non tender, non distended. Lower extremities are negative for pretibial pitting edema, deep calf tenderness, cyanosis or clubbing. Pelvic exam reveals normal-appearing external female genitalia, labial, perineal and perianal tissue. No lesions are seen grossly. BX is normal in appearance. Speculum exam reveals a well-healed vaginal cuff without visible abnormalities. Bimanual exam confirms a well healed cuff without palpable adnexa. Rectovaginal exam does not reveal any induration or nodularity  of the septum, there is minimal thinning posteriorly consistent with her previous radiotherapy treatment. No evidence of fistula is identified.   Lab Findings: Lab Results  Component Value Date   WBC 12.4* 12/28/2013   HGB 10.4* 12/28/2013   HCT 31.0* 12/28/2013   MCV 97.5 12/28/2013    PLT 316 12/28/2013     Radiographic Findings: No results found.  Impression/Plan: 1.  Squamous Cell Carcinoma of the Anus. The patient appears to be clinically without evidence of disease. I discussed with the patient the rationale for following her closely however as she is going to be seen again by Dr. Benay Spice and Ned Card, NP in September; I will plan to see her back next March, in 2018. We discussed the in the NCCN guidelines for surveillance of her disease, and have discussed that there is a role for imaging, however the patient and I had a good discussion about the fact that if she were to have a recurrence, she states that she would not pursue any additional therapy. I discussed with her that this is reasonable, and that for the same purpose, she is unwilling to have a colonoscopy/anoscopy. She states she would not seek additional treatment if an anomaly was identified, due to her age for these procedures. I explained to her the risks and benefits of this type of choice, and at the end of the conversation she is very comfortable moving forward expectantly with surveillance visit and exams only. She states agreement and understanding of this. She will also continue these conversations with Dr. Benay Spice. 2. Breast cancer screening. As above the patient is also not interested in any additional mammography screening as she states that she would not accept any adjuvant therapies or surgery.    Carola Rhine, PAC   This document serves as a record of services personally performed by Shona Simpson, PA-C. It was created on her behalf by Darcus Austin, a trained medical scribe. The creation of this record is based on the scribe's personal observations and the provider's statements to them. This document has been checked and approved by the attending provider.

## 2016-03-19 NOTE — Progress Notes (Signed)
[name] here for reassessment s/p xrt for anal cancer. Denies any pain nor any diarrhea at this time.   Last visit with Dr. Benay Spice 01/14/16.  Surveillance with Dr. Leighton Ruff and seen last year.

## 2016-03-20 ENCOUNTER — Telehealth: Payer: Self-pay | Admitting: *Deleted

## 2016-03-20 NOTE — Telephone Encounter (Signed)
CALLED PATIENT TO INFORM OF FU WITH ALISON PERKINS ON 03-17-17 @ 1:15 PM, SPOKE WITH PATIENT AND SHE IS AWARE OF THIS APPT.

## 2016-04-07 DIAGNOSIS — Z13820 Encounter for screening for osteoporosis: Secondary | ICD-10-CM | POA: Diagnosis not present

## 2016-04-07 DIAGNOSIS — Z0000 Encounter for general adult medical examination without abnormal findings: Secondary | ICD-10-CM | POA: Diagnosis not present

## 2016-04-07 DIAGNOSIS — E7800 Pure hypercholesterolemia, unspecified: Secondary | ICD-10-CM | POA: Diagnosis not present

## 2016-04-07 DIAGNOSIS — Z1389 Encounter for screening for other disorder: Secondary | ICD-10-CM | POA: Diagnosis not present

## 2016-04-07 DIAGNOSIS — E039 Hypothyroidism, unspecified: Secondary | ICD-10-CM | POA: Diagnosis not present

## 2016-05-09 DIAGNOSIS — H5213 Myopia, bilateral: Secondary | ICD-10-CM | POA: Diagnosis not present

## 2016-05-09 DIAGNOSIS — Z9849 Cataract extraction status, unspecified eye: Secondary | ICD-10-CM | POA: Diagnosis not present

## 2016-05-09 DIAGNOSIS — H52223 Regular astigmatism, bilateral: Secondary | ICD-10-CM | POA: Diagnosis not present

## 2016-05-09 DIAGNOSIS — H524 Presbyopia: Secondary | ICD-10-CM | POA: Diagnosis not present

## 2016-05-09 DIAGNOSIS — H43813 Vitreous degeneration, bilateral: Secondary | ICD-10-CM | POA: Diagnosis not present

## 2016-05-09 DIAGNOSIS — Z961 Presence of intraocular lens: Secondary | ICD-10-CM | POA: Diagnosis not present

## 2016-05-27 DIAGNOSIS — M85852 Other specified disorders of bone density and structure, left thigh: Secondary | ICD-10-CM | POA: Diagnosis not present

## 2016-07-15 DIAGNOSIS — K3184 Gastroparesis: Secondary | ICD-10-CM | POA: Diagnosis not present

## 2016-07-15 DIAGNOSIS — E039 Hypothyroidism, unspecified: Secondary | ICD-10-CM | POA: Diagnosis not present

## 2016-07-15 DIAGNOSIS — E559 Vitamin D deficiency, unspecified: Secondary | ICD-10-CM | POA: Diagnosis not present

## 2016-07-15 DIAGNOSIS — E782 Mixed hyperlipidemia: Secondary | ICD-10-CM | POA: Diagnosis not present

## 2016-07-15 DIAGNOSIS — I10 Essential (primary) hypertension: Secondary | ICD-10-CM | POA: Diagnosis not present

## 2016-07-15 DIAGNOSIS — Z79899 Other long term (current) drug therapy: Secondary | ICD-10-CM | POA: Diagnosis not present

## 2016-07-15 DIAGNOSIS — F325 Major depressive disorder, single episode, in full remission: Secondary | ICD-10-CM | POA: Diagnosis not present

## 2016-07-15 DIAGNOSIS — K219 Gastro-esophageal reflux disease without esophagitis: Secondary | ICD-10-CM | POA: Diagnosis not present

## 2016-07-15 DIAGNOSIS — D81818 Other biotin-dependent carboxylase deficiency: Secondary | ICD-10-CM | POA: Diagnosis not present

## 2016-08-18 ENCOUNTER — Telehealth: Payer: Self-pay | Admitting: *Deleted

## 2016-08-18 NOTE — Telephone Encounter (Signed)
Transferred call received from scheduling with pt.'s daughter on line requesting earlier appt with L. Thomas NP d/t pt has been having diarrhea for 3 days and is now bleeding from her rectum.  Pt's daughter states that pt has had no fevers, chills, pain or SOB.  Arvid Right NP notified and order received for pt to contact her GI MD to be seen by them at this time.  Pt.'s daughter appreciative of call back and will notify [place] with outcome from GI appt.

## 2016-09-12 ENCOUNTER — Ambulatory Visit (HOSPITAL_BASED_OUTPATIENT_CLINIC_OR_DEPARTMENT_OTHER): Payer: Medicare Other | Admitting: Nurse Practitioner

## 2016-09-12 ENCOUNTER — Encounter: Payer: Self-pay | Admitting: Nurse Practitioner

## 2016-09-12 ENCOUNTER — Telehealth: Payer: Self-pay | Admitting: Oncology

## 2016-09-12 VITALS — BP 169/81 | HR 80 | Temp 98.6°F | Resp 17 | Ht 66.0 in | Wt 150.0 lb

## 2016-09-12 DIAGNOSIS — R197 Diarrhea, unspecified: Secondary | ICD-10-CM

## 2016-09-12 DIAGNOSIS — R634 Abnormal weight loss: Secondary | ICD-10-CM | POA: Diagnosis not present

## 2016-09-12 DIAGNOSIS — C210 Malignant neoplasm of anus, unspecified: Secondary | ICD-10-CM

## 2016-09-12 DIAGNOSIS — Z85048 Personal history of other malignant neoplasm of rectum, rectosigmoid junction, and anus: Secondary | ICD-10-CM | POA: Diagnosis not present

## 2016-09-12 NOTE — Telephone Encounter (Signed)
Gave patient avs report and appointments for June. Patient also given appointments with Dr. Marcello Moores @ [facility] 10/16 @ 4:20 pm. Spoke with Gabriel Cirri.

## 2016-09-12 NOTE — Progress Notes (Addendum)
  [facility] OFFICE PROGRESS NOTE   Diagnosis: Anal cancer   INTERVAL HISTORY:   Cindy [name] returns as scheduled. Appetite is unchanged. She describes her appetite as "poor". No weight loss. Energy level has been diminished for the past 6 months. She continues to have intermittent diarrhea. The subsequently causes "rectal soreness". She denies any rectal bleeding. No pain. No fevers or sweats.  Objective:  Vital signs in last 24 hours:  Blood pressure (!) 169/81, pulse 80, temperature 98.6 F (37 C), temperature source Oral, resp. rate 17, height 5\' 6"  (1.676 m), weight 150 lb (68 kg), SpO2 97 %.    HEENT: No thrush or ulcers. Lymphatics: No palpable cervical, supraclavicular, axillary or inguinal lymph nodes. Resp: Lungs clear bilaterally. Cardio: Regular rate and rhythm. GI: Abdomen soft and nontender. No hepatomegaly. Vascular: No leg edema. Rectal: No anorectal mass.. Minimal irregularity at the anterior anal canal. External hemorrhoid. Musculoskeletal: Symmetric fullness/firmness with mild associated tenderness right and left suprapubic region.   Lab Results:  Lab Results  Component Value Date   WBC 12.4 (H) 12/28/2013   HGB 10.4 (L) 12/28/2013   HCT 31.0 (L) 12/28/2013   MCV 97.5 12/28/2013   PLT 316 12/28/2013   NEUTROABS 5.7 12/28/2013    Imaging:  No results found.  Medications: I have reviewed the patient's current medications.  Assessment/Plan: 1. Squamous cell carcinoma of the anus. Staging PET scan 09/23/2013 of a hypermetabolic anal mass and hypermetabolic right inguinal/perineal nodes. Radiation and cycle 1 5-FU/mitomycin C. beginning 10/10/2013.  Radiation completed 11/23/2013, she declined the second cycle of chemotherapy. 2. Question palpable inguinal/pubic lymphadenopathy. The suprapubic palpable lesions appeared hypermetabolic on the PET scan. The suprapubic lesions were barely palpable or resolved on exam 08/31/2014. Not  detected on exam 04/20/2015. 3. History of rectal pain and bleeding secondary to #1.  4. Nausea predating chemotherapy.  5. Thickening/hypermetabolism at the splenic flexure. Question second primary. 6. Mucositis secondary to chemotherapy. Resolved 7. Skin rash likely related to 5-fluorouracil. Resolved 8. Pancytopenia secondary to chemotherapy. Improved. 9. Diarrhea-likely secondary to chemotherapy induced enteritis, C. Difficile negative 10/23/2013.  10. History of hypokalemia secondary to diarrhea 11. Hospitalization with febrile neutropenia 10/23/2013 through 10/27/2013 12. Colonoscopy 05/16/2014 with diverticulosis in the sigmoid colon and descending colon; tortuous colon; distal rectum and anal verge normal.   Disposition:Cindy [name] remains in clinical remission from anal cancer. We made a referral to Dr. Marcello Moores for a surveillance examination. She will return for a follow-up visit here in 9 months.  Patient seen with Dr. Benay Spice.    Ned Card ANP/GNP-BC   09/12/2016  10:17 AM This was a shared visit with Ned Card. Cindy [name] was interviewed and examined. There is no clinical evidence for recurrent anal cancer. We will refer her to Dr. Marcello Moores for an anal examination.  Julieanne Manson, M.D.

## 2016-10-06 ENCOUNTER — Other Ambulatory Visit: Payer: Self-pay | Admitting: General Surgery

## 2016-10-06 DIAGNOSIS — C210 Malignant neoplasm of anus, unspecified: Secondary | ICD-10-CM

## 2016-10-06 DIAGNOSIS — Z23 Encounter for immunization: Secondary | ICD-10-CM | POA: Diagnosis not present

## 2016-10-13 ENCOUNTER — Ambulatory Visit
Admission: RE | Admit: 2016-10-13 | Discharge: 2016-10-13 | Disposition: A | Discharge status: Home / Self Care | Payer: Medicare Other | Source: Ambulatory Visit | Attending: General Surgery | Admitting: General Surgery

## 2016-10-13 DIAGNOSIS — K760 Fatty (change of) liver, not elsewhere classified: Secondary | ICD-10-CM | POA: Diagnosis not present

## 2016-10-13 DIAGNOSIS — R918 Other nonspecific abnormal finding of lung field: Secondary | ICD-10-CM | POA: Diagnosis not present

## 2016-10-13 DIAGNOSIS — C210 Malignant neoplasm of anus, unspecified: Secondary | ICD-10-CM

## 2016-10-13 MED ORDER — IOPAMIDOL (ISOVUE-300) INJECTION 61%
100.0000 mL | Freq: Once | INTRAVENOUS | Status: AC | PRN
  Administered 2016-10-13: 100 mL via INTRAVENOUS

## 2016-10-22 ENCOUNTER — Telehealth: Payer: Self-pay | Admitting: General Surgery

## 2016-10-22 NOTE — Telephone Encounter (Signed)
Discussed CT findings with Pt.  Suprapubic nodules slightly larger and were hypermetabolic in CT.  We discussed that the chance of this being metastatic tumor were small, but the area could be biopsied.  She has elected to follow this clinically and avoid biopsy at this time.

## 2016-10-27 DIAGNOSIS — L119 Acantholytic disorder, unspecified: Secondary | ICD-10-CM | POA: Diagnosis not present

## 2016-10-27 DIAGNOSIS — C4442 Squamous cell carcinoma of skin of scalp and neck: Secondary | ICD-10-CM | POA: Diagnosis not present

## 2016-10-27 DIAGNOSIS — L821 Other seborrheic keratosis: Secondary | ICD-10-CM | POA: Diagnosis not present

## 2016-10-27 DIAGNOSIS — Z85828 Personal history of other malignant neoplasm of skin: Secondary | ICD-10-CM | POA: Diagnosis not present

## 2016-10-27 DIAGNOSIS — D485 Neoplasm of uncertain behavior of skin: Secondary | ICD-10-CM | POA: Diagnosis not present

## 2016-10-27 DIAGNOSIS — L570 Actinic keratosis: Secondary | ICD-10-CM | POA: Diagnosis not present

## 2016-10-28 ENCOUNTER — Other Ambulatory Visit (HOSPITAL_COMMUNITY): Payer: Self-pay | Admitting: General Surgery

## 2016-10-28 DIAGNOSIS — C210 Malignant neoplasm of anus, unspecified: Secondary | ICD-10-CM

## 2016-10-31 ENCOUNTER — Other Ambulatory Visit: Payer: Self-pay | Admitting: General Surgery

## 2016-11-03 ENCOUNTER — Ambulatory Visit (HOSPITAL_COMMUNITY)
Admission: RE | Admit: 2016-11-03 | Discharge: 2016-11-03 | Disposition: A | Discharge status: Home / Self Care | Payer: Medicare Other | Source: Ambulatory Visit | Attending: General Surgery | Admitting: General Surgery

## 2016-11-03 ENCOUNTER — Encounter (HOSPITAL_COMMUNITY): Payer: Self-pay

## 2016-11-03 DIAGNOSIS — E785 Hyperlipidemia, unspecified: Secondary | ICD-10-CM | POA: Diagnosis not present

## 2016-11-03 DIAGNOSIS — Z803 Family history of malignant neoplasm of breast: Secondary | ICD-10-CM | POA: Diagnosis not present

## 2016-11-03 DIAGNOSIS — Z85828 Personal history of other malignant neoplasm of skin: Secondary | ICD-10-CM | POA: Insufficient documentation

## 2016-11-03 DIAGNOSIS — Z9889 Other specified postprocedural states: Secondary | ICD-10-CM | POA: Diagnosis not present

## 2016-11-03 DIAGNOSIS — E039 Hypothyroidism, unspecified: Secondary | ICD-10-CM | POA: Diagnosis not present

## 2016-11-03 DIAGNOSIS — Z888 Allergy status to other drugs, medicaments and biological substances status: Secondary | ICD-10-CM | POA: Diagnosis not present

## 2016-11-03 DIAGNOSIS — C210 Malignant neoplasm of anus, unspecified: Secondary | ICD-10-CM | POA: Insufficient documentation

## 2016-11-03 DIAGNOSIS — K219 Gastro-esophageal reflux disease without esophagitis: Secondary | ICD-10-CM | POA: Diagnosis not present

## 2016-11-03 DIAGNOSIS — Z8041 Family history of malignant neoplasm of ovary: Secondary | ICD-10-CM | POA: Diagnosis not present

## 2016-11-03 DIAGNOSIS — Z841 Family history of disorders of kidney and ureter: Secondary | ICD-10-CM | POA: Diagnosis not present

## 2016-11-03 DIAGNOSIS — F329 Major depressive disorder, single episode, unspecified: Secondary | ICD-10-CM | POA: Diagnosis not present

## 2016-11-03 DIAGNOSIS — Z8249 Family history of ischemic heart disease and other diseases of the circulatory system: Secondary | ICD-10-CM | POA: Diagnosis not present

## 2016-11-03 DIAGNOSIS — Q7959 Other congenital malformations of abdominal wall: Secondary | ICD-10-CM | POA: Diagnosis not present

## 2016-11-03 DIAGNOSIS — Z79899 Other long term (current) drug therapy: Secondary | ICD-10-CM | POA: Diagnosis not present

## 2016-11-03 DIAGNOSIS — I10 Essential (primary) hypertension: Secondary | ICD-10-CM | POA: Diagnosis not present

## 2016-11-03 DIAGNOSIS — M6028 Foreign body granuloma of soft tissue, not elsewhere classified, other site: Secondary | ICD-10-CM | POA: Diagnosis not present

## 2016-11-03 DIAGNOSIS — R222 Localized swelling, mass and lump, trunk: Secondary | ICD-10-CM | POA: Diagnosis not present

## 2016-11-03 LAB — PROTIME-INR
INR: 0.95
PROTHROMBIN TIME: 12.7 s (ref 11.4–15.2)

## 2016-11-03 LAB — APTT: APTT: 25 s (ref 24–36)

## 2016-11-03 LAB — CBC
HEMATOCRIT: 37.5 % (ref 36.0–46.0)
HEMOGLOBIN: 13 g/dL (ref 12.0–15.0)
MCH: 32.8 pg (ref 26.0–34.0)
MCHC: 34.7 g/dL (ref 30.0–36.0)
MCV: 94.7 fL (ref 78.0–100.0)
Platelets: 246 10*3/uL (ref 150–400)
RBC: 3.96 MIL/uL (ref 3.87–5.11)
RDW: 12.5 % (ref 11.5–15.5)
WBC: 6.2 10*3/uL (ref 4.0–10.5)

## 2016-11-03 MED ORDER — FENTANYL CITRATE (PF) 100 MCG/2ML IJ SOLN
INTRAMUSCULAR | Status: AC | PRN
  Administered 2016-11-03 (×2): 25 ug via INTRAVENOUS

## 2016-11-03 MED ORDER — MIDAZOLAM HCL 2 MG/2ML IJ SOLN
INTRAMUSCULAR | Status: AC
  Filled 2016-11-03: qty 2

## 2016-11-03 MED ORDER — LIDOCAINE HCL 1 % IJ SOLN
INTRAMUSCULAR | Status: AC
  Filled 2016-11-03: qty 20

## 2016-11-03 MED ORDER — SODIUM CHLORIDE 0.9 % IV SOLN
INTRAVENOUS | Status: DC
  Administered 2016-11-03: 12:00:00 via INTRAVENOUS

## 2016-11-03 MED ORDER — FENTANYL CITRATE (PF) 100 MCG/2ML IJ SOLN
INTRAMUSCULAR | Status: AC
  Filled 2016-11-03: qty 2

## 2016-11-03 MED ORDER — MIDAZOLAM HCL 2 MG/2ML IJ SOLN
INTRAMUSCULAR | Status: AC | PRN
  Administered 2016-11-03: 0.5 mg via INTRAVENOUS
  Administered 2016-11-03: 1 mg via INTRAVENOUS

## 2016-11-03 NOTE — H&P (Signed)
Chief Complaint: Patient was seen in consultation today for abdominal wall biopsy at the request of [place]  Referring Physician(s): Thomas,Alicia  Supervising Physician: Arne Cleveland  Patient Status: [facility] - Out-pt  History of Present Illness: Cindy [name] is a 82 y.o. female with hx of anal cancer. She has had lower abdominal wall nodules that have been followed but are now recommended for biopsy. She is scheduled today PMHx, meds, labs, imaging, allergies. Has been NPO today.  Past Medical History:  Diagnosis Date  . Arthritis   . Cancer ([place])    skin  . Colon cancer ([place]) 08/31/13   invasive squamous cell  . Deaf, left   . Depression   . GERD (gastroesophageal reflux disease)   . History of radiation therapy 10/10/13-11/23/13   anal canal 54GY/  . Hyperlipemia   . Hypertension   . Hypothyroidism   . PONV (postoperative nausea and vomiting)   . Skin cancer    Shingles 2012 Bad case    Past Surgical History:  Procedure Laterality Date  . ABDOMINAL HYSTERECTOMY    . APPENDECTOMY    . CESAREAN SECTION     2  . CHOLECYSTECTOMY    . COLONOSCOPY  08/31/13   invasive squamous cell colon  . ERCP    . ESOPHAGOGASTRODUODENOSCOPY  10/20/2012   Procedure: ESOPHAGOGASTRODUODENOSCOPY (EGD);  Surgeon: Arta Silence, MD;  Location: [facility];  Service: Endoscopy;  Laterality: Left;  . EYE SURGERY     cataracts  . KNEE ARTHROSCOPY  05/11/2012   Procedure: ARTHROSCOPY KNEE;  Surgeon: Hessie Dibble, MD;  Location: [facility];  Service: Orthopedics;  Laterality: Right;  left knee medial menisectomy and chondroplasty  . THYROIDECTOMY, PARTIAL    . TONSILLECTOMY      Allergies: Morphine and related  Medications: Prior to Admission medications   Medication Sig Start Date End Date Taking? Authorizing Provider  docusate sodium (COLACE) 100 MG capsule Take 100 mg by mouth daily.   Yes Historical Provider, MD  FLUoxetine (PROZAC) 40 MG  capsule Take 40 mg by mouth every morning.    Yes Historical Provider, MD  Ibuprofen-Diphenhydramine HCl (ADVIL PM) 200-25 MG CAPS Take 1 capsule by mouth at bedtime as needed.    Yes Historical Provider, MD  levothyroxine (SYNTHROID, LEVOTHROID) 88 MCG tablet Take 88 mcg by mouth daily before breakfast.   Yes Historical Provider, MD  Multiple Vitamin (MULTIVITAMIN WITH MINERALS) TABS tablet Take 1 tablet by mouth daily. 10/27/13  Yes Christina P Rama, MD  PREMARIN 0.3 MG tablet Take 0.3 mg by mouth daily. 08/14/16  Yes Historical Provider, MD  telmisartan-hydrochlorothiazide (MICARDIS HCT) 80-12.5 MG tablet Take 1 tablet by mouth daily.   Yes Historical Provider, MD     Family History  Problem Relation Age of Onset  . Kidney disease Mother   . Heart attack Father   . Heart disease Father   . Cancer Sister     breast  . Cancer Sister     ovarian  . Cancer Sister     melanoma  . Cancer Daughter     breast    Social History   Social History  . Marital status: Widowed    Spouse name: N/A  . Number of children: N/A  . Years of education: N/A   Social History Main Topics  . Smoking status: Never Smoker  . Smokeless tobacco: Never Used  . Alcohol use No  . Drug use: No  . Sexual activity: No  Other Topics Concern  . None   Social History Narrative  . None     Review of Systems: A 12 point ROS discussed and pertinent positives are indicated in the HPI above.  All other systems are negative.  Review of Systems  Vital Signs: BP (!) 174/66   Pulse 63   Temp 98.4 F (36.9 C) (Oral)   Resp 16   Ht 5\' 6"  (1.676 m)   Wt 150 lb (68 kg)   SpO2 94%   BMI 24.21 kg/m   Physical Exam  Constitutional: She is oriented to person, place, and time. She appears well-developed and well-nourished. No distress.  HENT:  Head: Normocephalic.  Mouth/Throat: Oropharynx is clear and moist.  Neck: Normal range of motion. No tracheal deviation present. No thyromegaly present.    Cardiovascular: Normal rate, regular rhythm and normal heart sounds.   Pulmonary/Chest: Effort normal and breath sounds normal. No respiratory distress.  Neurological: She is alert and oriented to person, place, and time.  Skin: Skin is warm and dry.  Psychiatric: She has a normal mood and affect. Judgment normal.     Mallampati Score:  MD Evaluation Airway: WNL Heart: WNL Abdomen: WNL Chest/ Lungs: WNL ASA  Classification: 2 Mallampati/Airway Score: One  Imaging: Ct Chest W Contrast  Result Date: 10/13/2016 CLINICAL DATA:  Restaging anal carcinoma diagnosed in 2014. Previous rectal surgery, chemotherapy and radiation therapy. EXAM: CT CHEST, ABDOMEN, AND PELVIS WITH CONTRAST TECHNIQUE: Multidetector CT imaging of the chest, abdomen and pelvis was performed following the standard protocol during bolus administration of intravenous contrast. CONTRAST:  190mL ISOVUE-300 IOPAMIDOL (ISOVUE-300) INJECTION 61% COMPARISON:  CTs 09/23/2013.  PET-CT 09/23/2013. FINDINGS: CT CHEST FINDINGS Cardiovascular: Atherosclerosis of the aorta, great vessels and coronary arteries. No acute vascular findings are demonstrated. The heart size is normal. There is no pericardial effusion. Mediastinum/Nodes: There are no enlarged mediastinal, hilar or axillary lymph nodes. Stable small hiatal hernia. The trachea and esophagus demonstrate no significant findings. Lungs/Pleura: There is no pleural effusion. There are stable tiny subpleural nodules in the right upper and middle lobes (images 67 and 86). There is also a stable 3 mm left upper lobe nodule (image 24). No suspicious nodules. Musculoskeletal/Chest wall: No chest wall mass or suspicious osseous findings. CT ABDOMEN AND PELVIS FINDINGS Hepatobiliary: There is mild heterogeneous hepatic steatosis. No suspicious hepatic findings. Stable mild extrahepatic biliary prominence status post cholecystectomy, within physiologic limits. Pancreas: Unremarkable. No  pancreatic ductal dilatation or surrounding inflammatory changes. Spleen: Normal in size without focal abnormality. Adrenals/Urinary Tract: Both adrenal glands appear normal. There is a tiny cyst in the lower pole of the right kidney. No evidence of renal mass, hydronephrosis or urinary tract calculus. Mild bladder wall thickening versus incomplete distention. Stomach/Bowel: No evidence of bowel wall thickening, distention or surrounding inflammatory change. Mild diverticular changes within the sigmoid colon. Vascular/Lymphatic: Stable aortic and branch vessel atherosclerosis. No evidence of large vessel occlusion. There are no enlarged intra-abdominal or retroperitoneal lymph nodes. There is no residual angle adenopathy. However, there are persistent soft tissue nodules medially in the suprapubic area, measuring 13 x 13 mm on the right and 10 x 13 mm on the left (image 176). These are similar in size to the prior CT. These lesions were hypermetabolic on PET-CT. They may be associated with a suprapubic incision. Reproductive: Hysterectomy.  No evidence of adnexal mass. Other: No ascites or peritoneal nodularity. Musculoskeletal: No acute or significant osseous findings. Lower lumbar spondylosis with potentially significant spinal stenosis, similar  to previous study. IMPRESSION: 1. Persistent bilateral suprapubic anterior abdominal wall nodularity. This was hypermetabolic on prior PET-CT and may reflect residual/recurrent perineal nodal metastases. These could be sampled under ultrasound guidance if clinically warranted. 2. No residual inguinal or intra abdominal lymphadenopathy. No evidence of hepatic metastatic disease. 3. Stable small pulmonary nodules, consistent with a benign etiology, likely lymph nodes. Electronically Signed   By: Richardean Sale M.D.   On: 10/13/2016 15:30   Ct Abdomen Pelvis W Contrast  Result Date: 10/13/2016 CLINICAL DATA:  Restaging anal carcinoma diagnosed in 2014. Previous rectal  surgery, chemotherapy and radiation therapy. EXAM: CT CHEST, ABDOMEN, AND PELVIS WITH CONTRAST TECHNIQUE: Multidetector CT imaging of the chest, abdomen and pelvis was performed following the standard protocol during bolus administration of intravenous contrast. CONTRAST:  163mL ISOVUE-300 IOPAMIDOL (ISOVUE-300) INJECTION 61% COMPARISON:  CTs 09/23/2013.  PET-CT 09/23/2013. FINDINGS: CT CHEST FINDINGS Cardiovascular: Atherosclerosis of the aorta, great vessels and coronary arteries. No acute vascular findings are demonstrated. The heart size is normal. There is no pericardial effusion. Mediastinum/Nodes: There are no enlarged mediastinal, hilar or axillary lymph nodes. Stable small hiatal hernia. The trachea and esophagus demonstrate no significant findings. Lungs/Pleura: There is no pleural effusion. There are stable tiny subpleural nodules in the right upper and middle lobes (images 67 and 86). There is also a stable 3 mm left upper lobe nodule (image 24). No suspicious nodules. Musculoskeletal/Chest wall: No chest wall mass or suspicious osseous findings. CT ABDOMEN AND PELVIS FINDINGS Hepatobiliary: There is mild heterogeneous hepatic steatosis. No suspicious hepatic findings. Stable mild extrahepatic biliary prominence status post cholecystectomy, within physiologic limits. Pancreas: Unremarkable. No pancreatic ductal dilatation or surrounding inflammatory changes. Spleen: Normal in size without focal abnormality. Adrenals/Urinary Tract: Both adrenal glands appear normal. There is a tiny cyst in the lower pole of the right kidney. No evidence of renal mass, hydronephrosis or urinary tract calculus. Mild bladder wall thickening versus incomplete distention. Stomach/Bowel: No evidence of bowel wall thickening, distention or surrounding inflammatory change. Mild diverticular changes within the sigmoid colon. Vascular/Lymphatic: Stable aortic and branch vessel atherosclerosis. No evidence of large vessel occlusion.  There are no enlarged intra-abdominal or retroperitoneal lymph nodes. There is no residual angle adenopathy. However, there are persistent soft tissue nodules medially in the suprapubic area, measuring 13 x 13 mm on the right and 10 x 13 mm on the left (image 176). These are similar in size to the prior CT. These lesions were hypermetabolic on PET-CT. They may be associated with a suprapubic incision. Reproductive: Hysterectomy.  No evidence of adnexal mass. Other: No ascites or peritoneal nodularity. Musculoskeletal: No acute or significant osseous findings. Lower lumbar spondylosis with potentially significant spinal stenosis, similar to previous study. IMPRESSION: 1. Persistent bilateral suprapubic anterior abdominal wall nodularity. This was hypermetabolic on prior PET-CT and may reflect residual/recurrent perineal nodal metastases. These could be sampled under ultrasound guidance if clinically warranted. 2. No residual inguinal or intra abdominal lymphadenopathy. No evidence of hepatic metastatic disease. 3. Stable small pulmonary nodules, consistent with a benign etiology, likely lymph nodes. Electronically Signed   By: Richardean Sale M.D.   On: 10/13/2016 15:30    Labs:  CBC:  Recent Labs  11/03/16 1205  WBC 6.2  HGB 13.0  HCT 37.5  PLT 246    COAGS:  Recent Labs  11/03/16 1205  INR 0.95  APTT 25    Assessment and Plan: Abdominal wall nodules. For [place] bx Risks and Benefits discussed with the patient including, but  not limited to bleeding, infection, damage to adjacent structures or low yield requiring additional tests. All of the patient's questions were answered, patient is agreeable to proceed. Consent signed and in chart.    Thank you for this interesting consult.  I greatly enjoyed meeting Cindy [name] and look forward to participating in their care.  A copy of this report was sent to the requesting provider on this date.  Electronically Signed: Ascencion Dike 11/03/2016, 1:09 PM   I spent a total of 20 minutes in face to face in clinical consultation, greater than 50% of which was counseling/coordinating care for biopsy

## 2016-11-03 NOTE — Procedures (Signed)
[place] core bx RLQ abd wall nodule 18g x4 to surg path No complication No blood loss. See complete dictation in [facility].

## 2016-11-03 NOTE — Discharge Instructions (Signed)
Needle Biopsy, Care After °These instructions give you information about caring for yourself after your procedure. Your doctor may also give you more specific instructions. Call your doctor if you have any problems or questions after your procedure. °HOME CARE °· Rest as told by your doctor. °· Take medicines only as told by your doctor. °· There are many different ways to close and cover the biopsy site, including stitches (sutures), skin glue, and adhesive strips. Follow instructions from your doctor about: °¨ How to take care of your biopsy site. °¨ When and how you should change your bandage (dressing). °¨ When you should remove your dressing. °¨ Removing whatever was used to close your biopsy site. °· Check your biopsy site every day for signs of infection. Watch for: °¨ Redness, swelling, or pain. °¨ Fluid, blood, or pus. °GET HELP IF: °· You have a fever. °· You have redness, swelling, or pain at the biopsy site, and it lasts longer than a few days. °· You have fluid, blood, or pus coming from the biopsy site. °· You feel sick to your stomach (nauseous). °· You throw up (vomit). °GET HELP RIGHT AWAY IF: °· You are short of breath. °· You have trouble breathing. °· Your chest hurts. °· You feel dizzy or you pass out (faint). °· You have bleeding that does not stop with pressure or a bandage. °· You cough up blood. °· Your belly (abdomen) hurts. °  °This information is not intended to replace advice given to you by your health care provider. Make sure you discuss any questions you have with your health care provider. °  °Document Released: 11/20/2008 Document Revised: 04/24/2015 Document Reviewed: 12/04/2014 °Elsevier Interactive Patient Education ©2016 [employer] ° °

## 2016-12-18 ENCOUNTER — Other Ambulatory Visit: Payer: Self-pay | Admitting: Nurse Practitioner

## 2017-01-05 DIAGNOSIS — K625 Hemorrhage of anus and rectum: Secondary | ICD-10-CM | POA: Diagnosis not present

## 2017-01-05 DIAGNOSIS — R195 Other fecal abnormalities: Secondary | ICD-10-CM | POA: Diagnosis not present

## 2017-01-05 DIAGNOSIS — R110 Nausea: Secondary | ICD-10-CM | POA: Diagnosis not present

## 2017-01-27 DIAGNOSIS — R197 Diarrhea, unspecified: Secondary | ICD-10-CM | POA: Diagnosis not present

## 2017-01-27 DIAGNOSIS — R110 Nausea: Secondary | ICD-10-CM | POA: Diagnosis not present

## 2017-01-27 DIAGNOSIS — K921 Melena: Secondary | ICD-10-CM | POA: Diagnosis not present

## 2017-02-08 ENCOUNTER — Emergency Department (HOSPITAL_COMMUNITY): Payer: Medicare Other

## 2017-02-08 ENCOUNTER — Encounter (HOSPITAL_COMMUNITY): Payer: Self-pay | Admitting: Emergency Medicine

## 2017-02-08 ENCOUNTER — Emergency Department (HOSPITAL_COMMUNITY)
Admission: EM | Admit: 2017-02-08 | Discharge: 2017-02-08 | Disposition: A | Discharge status: Home / Self Care | Payer: Medicare Other | Attending: Emergency Medicine | Admitting: Emergency Medicine

## 2017-02-08 DIAGNOSIS — Y939 Activity, unspecified: Secondary | ICD-10-CM | POA: Insufficient documentation

## 2017-02-08 DIAGNOSIS — E039 Hypothyroidism, unspecified: Secondary | ICD-10-CM | POA: Insufficient documentation

## 2017-02-08 DIAGNOSIS — Z79899 Other long term (current) drug therapy: Secondary | ICD-10-CM | POA: Insufficient documentation

## 2017-02-08 DIAGNOSIS — Z85828 Personal history of other malignant neoplasm of skin: Secondary | ICD-10-CM | POA: Diagnosis not present

## 2017-02-08 DIAGNOSIS — Z85038 Personal history of other malignant neoplasm of large intestine: Secondary | ICD-10-CM | POA: Diagnosis not present

## 2017-02-08 DIAGNOSIS — R0789 Other chest pain: Secondary | ICD-10-CM | POA: Insufficient documentation

## 2017-02-08 DIAGNOSIS — Y92410 Unspecified street and highway as the place of occurrence of the external cause: Secondary | ICD-10-CM | POA: Insufficient documentation

## 2017-02-08 DIAGNOSIS — R079 Chest pain, unspecified: Secondary | ICD-10-CM

## 2017-02-08 DIAGNOSIS — I10 Essential (primary) hypertension: Secondary | ICD-10-CM | POA: Diagnosis not present

## 2017-02-08 DIAGNOSIS — R0602 Shortness of breath: Secondary | ICD-10-CM | POA: Diagnosis not present

## 2017-02-08 LAB — CBC WITH DIFFERENTIAL/PLATELET
Basophils Absolute: 0 10*3/uL (ref 0.0–0.1)
Basophils Relative: 0 %
EOS ABS: 0.1 10*3/uL (ref 0.0–0.7)
Eosinophils Relative: 1 %
HEMATOCRIT: 37.8 % (ref 36.0–46.0)
HEMOGLOBIN: 13.1 g/dL (ref 12.0–15.0)
Lymphocytes Relative: 32 %
Lymphs Abs: 2.2 10*3/uL (ref 0.7–4.0)
MCH: 32.7 pg (ref 26.0–34.0)
MCHC: 34.7 g/dL (ref 30.0–36.0)
MCV: 94.3 fL (ref 78.0–100.0)
Monocytes Absolute: 0.6 10*3/uL (ref 0.1–1.0)
Monocytes Relative: 8 %
NEUTROS ABS: 4.1 10*3/uL (ref 1.7–7.7)
NEUTROS PCT: 59 %
Platelets: 252 10*3/uL (ref 150–400)
RBC: 4.01 MIL/uL (ref 3.87–5.11)
RDW: 12.7 % (ref 11.5–15.5)
WBC: 6.9 10*3/uL (ref 4.0–10.5)

## 2017-02-08 LAB — BASIC METABOLIC PANEL
ANION GAP: 11 (ref 5–15)
BUN: 14 mg/dL (ref 6–20)
CHLORIDE: 103 mmol/L (ref 101–111)
CO2: 23 mmol/L (ref 22–32)
Calcium: 8.9 mg/dL (ref 8.9–10.3)
Creatinine, Ser: 0.99 mg/dL (ref 0.44–1.00)
GFR calc non Af Amer: 51 mL/min — ABNORMAL LOW (ref >60)
GFR, EST AFRICAN AMERICAN: 59 mL/min — AB (ref >60)
Glucose, Bld: 104 mg/dL — ABNORMAL HIGH (ref 65–99)
POTASSIUM: 3.1 mmol/L — AB (ref 3.5–5.1)
SODIUM: 137 mmol/L (ref 135–145)

## 2017-02-08 LAB — TROPONIN I

## 2017-02-08 MED ORDER — FENTANYL CITRATE (PF) 100 MCG/2ML IJ SOLN
50.0000 ug | Freq: Once | INTRAMUSCULAR | Status: AC
  Administered 2017-02-08: 50 ug via INTRAVENOUS
  Filled 2017-02-08: qty 2

## 2017-02-08 MED ORDER — DIPHENHYDRAMINE HCL 50 MG/ML IJ SOLN: 12.5000 mg | Freq: Once | INTRAMUSCULAR | Status: DC

## 2017-02-08 MED ORDER — MORPHINE SULFATE (PF) 4 MG/ML IV SOLN
2.0000 mg | Freq: Once | INTRAVENOUS | Status: DC
Start: 2017-02-08 — End: 2017-02-09

## 2017-02-08 MED ORDER — IOPAMIDOL (ISOVUE-370) INJECTION 76%
INTRAVENOUS | Status: AC
  Administered 2017-02-08: 80 mL via INTRAVENOUS
  Filled 2017-02-08: qty 100

## 2017-02-08 MED ORDER — SODIUM CHLORIDE 0.9 % IJ SOLN
INTRAMUSCULAR | Status: AC
  Filled 2017-02-08: qty 50

## 2017-02-08 MED ORDER — LORAZEPAM 2 MG/ML IJ SOLN
0.5000 mg | Freq: Once | INTRAMUSCULAR | Status: AC
  Administered 2017-02-08: 0.5 mg via INTRAVENOUS
  Filled 2017-02-08: qty 1

## 2017-02-08 MED ORDER — POTASSIUM CHLORIDE CRYS ER 20 MEQ PO TBCR
40.0000 meq | EXTENDED_RELEASE_TABLET | Freq: Once | ORAL | Status: AC
  Administered 2017-02-08: 40 meq via ORAL
  Filled 2017-02-08: qty 2
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
## 2017-02-08 NOTE — ED Notes (Signed)
Patient transported to CT 

## 2017-02-08 NOTE — Discharge Instructions (Signed)
Use Motrin as directed for pain.
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
## 2017-02-11 DIAGNOSIS — G4700 Insomnia, unspecified: Secondary | ICD-10-CM | POA: Diagnosis not present

## 2017-02-11 DIAGNOSIS — R110 Nausea: Secondary | ICD-10-CM | POA: Diagnosis not present

## 2017-02-11 DIAGNOSIS — E876 Hypokalemia: Secondary | ICD-10-CM | POA: Diagnosis not present

## 2017-02-23 ENCOUNTER — Telehealth: Payer: Self-pay | Admitting: *Deleted

## 2017-02-23 NOTE — Telephone Encounter (Signed)
CALLED PATIENT TO INFORM THAT FU ON 03-17-17 HAS BEEN MOVED TO 1 PM , LVM FOR A RETURN CALL

## 2017-03-04 NOTE — Progress Notes (Addendum)
Cindy [name] woman with Anal Cancer, squamous cell carcinoma radiation completed 11-23-13  3 year FU.  Nausea/ Vomiting: Diarrhea: Skin irritation: Fatigue: Weight: 09-12-16 Saw Dr. Johnanna Schneiders referral to Dr. Marcello Moores for a surveillance examination. She will return for a follow-up visit here in 9 months.

## 2017-03-09 ENCOUNTER — Emergency Department (HOSPITAL_COMMUNITY): Payer: Medicare Other

## 2017-03-09 ENCOUNTER — Encounter: Payer: Self-pay | Admitting: Radiation Oncology

## 2017-03-09 ENCOUNTER — Encounter (HOSPITAL_COMMUNITY): Payer: Self-pay

## 2017-03-09 ENCOUNTER — Inpatient Hospital Stay (HOSPITAL_COMMUNITY)
Admission: EM | Admit: 2017-03-09 | Discharge: 2017-03-13 | DRG: 872 | Disposition: A | Discharge status: Home / Self Care | Payer: Medicare Other | Attending: Internal Medicine | Admitting: Internal Medicine

## 2017-03-09 DIAGNOSIS — E039 Hypothyroidism, unspecified: Secondary | ICD-10-CM | POA: Diagnosis present

## 2017-03-09 DIAGNOSIS — I10 Essential (primary) hypertension: Secondary | ICD-10-CM | POA: Diagnosis present

## 2017-03-09 DIAGNOSIS — E785 Hyperlipidemia, unspecified: Secondary | ICD-10-CM | POA: Diagnosis present

## 2017-03-09 DIAGNOSIS — Z7982 Long term (current) use of aspirin: Secondary | ICD-10-CM

## 2017-03-09 DIAGNOSIS — Z881 Allergy status to other antibiotic agents status: Secondary | ICD-10-CM | POA: Diagnosis not present

## 2017-03-09 DIAGNOSIS — R748 Abnormal levels of other serum enzymes: Secondary | ICD-10-CM

## 2017-03-09 DIAGNOSIS — Z79899 Other long term (current) drug therapy: Secondary | ICD-10-CM

## 2017-03-09 DIAGNOSIS — I248 Other forms of acute ischemic heart disease: Secondary | ICD-10-CM | POA: Diagnosis present

## 2017-03-09 DIAGNOSIS — R197 Diarrhea, unspecified: Secondary | ICD-10-CM | POA: Diagnosis not present

## 2017-03-09 DIAGNOSIS — C210 Malignant neoplasm of anus, unspecified: Secondary | ICD-10-CM

## 2017-03-09 DIAGNOSIS — Z885 Allergy status to narcotic agent status: Secondary | ICD-10-CM | POA: Diagnosis not present

## 2017-03-09 DIAGNOSIS — A419 Sepsis, unspecified organism: Principal | ICD-10-CM | POA: Diagnosis present

## 2017-03-09 DIAGNOSIS — R0602 Shortness of breath: Secondary | ICD-10-CM | POA: Diagnosis not present

## 2017-03-09 DIAGNOSIS — Z888 Allergy status to other drugs, medicaments and biological substances status: Secondary | ICD-10-CM

## 2017-03-09 DIAGNOSIS — R079 Chest pain, unspecified: Secondary | ICD-10-CM | POA: Diagnosis not present

## 2017-03-09 DIAGNOSIS — H9192 Unspecified hearing loss, left ear: Secondary | ICD-10-CM | POA: Diagnosis present

## 2017-03-09 DIAGNOSIS — E872 Acidosis: Secondary | ICD-10-CM | POA: Diagnosis present

## 2017-03-09 DIAGNOSIS — R7989 Other specified abnormal findings of blood chemistry: Secondary | ICD-10-CM | POA: Diagnosis present

## 2017-03-09 DIAGNOSIS — K529 Noninfective gastroenteritis and colitis, unspecified: Secondary | ICD-10-CM | POA: Diagnosis present

## 2017-03-09 DIAGNOSIS — F329 Major depressive disorder, single episode, unspecified: Secondary | ICD-10-CM | POA: Diagnosis present

## 2017-03-09 DIAGNOSIS — M25511 Pain in right shoulder: Secondary | ICD-10-CM | POA: Diagnosis present

## 2017-03-09 DIAGNOSIS — M1990 Unspecified osteoarthritis, unspecified site: Secondary | ICD-10-CM | POA: Diagnosis present

## 2017-03-09 DIAGNOSIS — K5730 Diverticulosis of large intestine without perforation or abscess without bleeding: Secondary | ICD-10-CM | POA: Diagnosis not present

## 2017-03-09 DIAGNOSIS — Z85828 Personal history of other malignant neoplasm of skin: Secondary | ICD-10-CM | POA: Diagnosis not present

## 2017-03-09 DIAGNOSIS — Z85048 Personal history of other malignant neoplasm of rectum, rectosigmoid junction, and anus: Secondary | ICD-10-CM

## 2017-03-09 DIAGNOSIS — K219 Gastro-esophageal reflux disease without esophagitis: Secondary | ICD-10-CM | POA: Diagnosis not present

## 2017-03-09 DIAGNOSIS — R0789 Other chest pain: Secondary | ICD-10-CM | POA: Diagnosis not present

## 2017-03-09 DIAGNOSIS — R112 Nausea with vomiting, unspecified: Secondary | ICD-10-CM | POA: Diagnosis not present

## 2017-03-09 DIAGNOSIS — R778 Other specified abnormalities of plasma proteins: Secondary | ICD-10-CM | POA: Diagnosis present

## 2017-03-09 HISTORY — DX: Sepsis, unspecified organism: A41.9

## 2017-03-09 LAB — CBC
HCT: 39 % (ref 36.0–46.0)
Hemoglobin: 13.8 g/dL (ref 12.0–15.0)
MCH: 32.7 pg (ref 26.0–34.0)
MCHC: 35.4 g/dL (ref 30.0–36.0)
MCV: 92.4 fL (ref 78.0–100.0)
PLATELETS: 242 10*3/uL (ref 150–400)
RBC: 4.22 MIL/uL (ref 3.87–5.11)
RDW: 12.8 % (ref 11.5–15.5)
WBC: 19.5 10*3/uL — ABNORMAL HIGH (ref 4.0–10.5)

## 2017-03-09 LAB — COMPREHENSIVE METABOLIC PANEL
ALBUMIN: 4.2 g/dL (ref 3.5–5.0)
ALT: 26 U/L (ref 14–54)
AST: 44 U/L — AB (ref 15–41)
Alkaline Phosphatase: 51 U/L (ref 38–126)
Anion gap: 14 (ref 5–15)
BUN: 14 mg/dL (ref 6–20)
CALCIUM: 9.7 mg/dL (ref 8.9–10.3)
CHLORIDE: 100 mmol/L — AB (ref 101–111)
CO2: 21 mmol/L — AB (ref 22–32)
Creatinine, Ser: 1.12 mg/dL — ABNORMAL HIGH (ref 0.44–1.00)
GFR calc Af Amer: 50 mL/min — ABNORMAL LOW (ref >60)
GFR calc non Af Amer: 44 mL/min — ABNORMAL LOW (ref >60)
GLUCOSE: 137 mg/dL — AB (ref 65–99)
Potassium: 3.7 mmol/L (ref 3.5–5.1)
SODIUM: 135 mmol/L (ref 135–145)
Total Bilirubin: 1.2 mg/dL (ref 0.3–1.2)
Total Protein: 8.1 g/dL (ref 6.5–8.1)

## 2017-03-09 LAB — LIPASE, BLOOD: Lipase: 19 U/L (ref 11–51)

## 2017-03-09 LAB — URINALYSIS, ROUTINE W REFLEX MICROSCOPIC
BILIRUBIN URINE: NEGATIVE
GLUCOSE, UA: NEGATIVE mg/dL
HGB URINE DIPSTICK: NEGATIVE
Ketones, ur: 5 mg/dL — AB
Leukocytes, UA: NEGATIVE
Nitrite: NEGATIVE
PROTEIN: NEGATIVE mg/dL
Specific Gravity, Urine: 1.006 (ref 1.005–1.030)
pH: 9 — ABNORMAL HIGH (ref 5.0–8.0)

## 2017-03-09 LAB — I-STAT CG4 LACTIC ACID, ED
Lactic Acid, Venous: 4.37 mmol/L (ref 0.5–1.9)
Lactic Acid, Venous: 3.18 mmol/L (ref 0.5–1.9)

## 2017-03-09 LAB — I-STAT TROPONIN, ED: Troponin i, poc: 0.25 ng/mL (ref 0.00–0.08)

## 2017-03-09 LAB — INFLUENZA PANEL BY PCR (TYPE A & B)
Influenza A By PCR: NEGATIVE
Influenza B By PCR: NEGATIVE

## 2017-03-09 LAB — PROCALCITONIN: PROCALCITONIN: 5.39 ng/mL

## 2017-03-09 LAB — TROPONIN I: TROPONIN I: 0.33 ng/mL — AB (ref <0.03)

## 2017-03-09 LAB — LACTIC ACID, PLASMA: LACTIC ACID, VENOUS: 2.6 mmol/L — AB (ref 0.5–1.9)

## 2017-03-09 MED ORDER — FENTANYL CITRATE (PF) 100 MCG/2ML IJ SOLN: 50.0000 ug | Freq: Once | INTRAMUSCULAR | Status: DC

## 2017-03-09 MED ORDER — PANTOPRAZOLE SODIUM 40 MG PO TBEC
40.0000 mg | DELAYED_RELEASE_TABLET | Freq: Every day | ORAL | Status: DC
  Administered 2017-03-10 – 2017-03-13 (×4): 40 mg via ORAL
  Filled 2017-03-09 (×4): qty 1

## 2017-03-09 MED ORDER — ACETAMINOPHEN 325 MG PO TABS
650.0000 mg | ORAL_TABLET | Freq: Four times a day (QID) | ORAL | Status: DC | PRN
  Administered 2017-03-12: 650 mg via ORAL
  Filled 2017-03-09: qty 2

## 2017-03-09 MED ORDER — DIPHENHYDRAMINE HCL 50 MG/ML IJ SOLN
25.0000 mg | Freq: Once | INTRAMUSCULAR | Status: AC
  Administered 2017-03-09: 25 mg via INTRAVENOUS

## 2017-03-09 MED ORDER — FLUOXETINE HCL 20 MG PO CAPS
40.0000 mg | ORAL_CAPSULE | Freq: Every morning | ORAL | Status: DC
  Administered 2017-03-10 – 2017-03-13 (×4): 40 mg via ORAL
  Filled 2017-03-09 (×4): qty 2

## 2017-03-09 MED ORDER — FENTANYL CITRATE (PF) 100 MCG/2ML IJ SOLN
25.0000 ug | Freq: Once | INTRAMUSCULAR | Status: AC
  Administered 2017-03-09: 25 ug via INTRAVENOUS
  Filled 2017-03-09: qty 2

## 2017-03-09 MED ORDER — PIPERACILLIN-TAZOBACTAM 3.375 G IVPB 30 MIN: 3.3750 g | Freq: Once | INTRAVENOUS | Status: DC

## 2017-03-09 MED ORDER — SODIUM CHLORIDE 0.9 % IV BOLUS (SEPSIS)
250.0000 mL | Freq: Once | INTRAVENOUS | Status: AC
  Administered 2017-03-09: 250 mL via INTRAVENOUS

## 2017-03-09 MED ORDER — SODIUM CHLORIDE 0.9% FLUSH
3.0000 mL | Freq: Two times a day (BID) | INTRAVENOUS | Status: DC
  Administered 2017-03-09 – 2017-03-13 (×6): 3 mL via INTRAVENOUS

## 2017-03-09 MED ORDER — ENOXAPARIN SODIUM 40 MG/0.4ML ~~LOC~~ SOLN
40.0000 mg | SUBCUTANEOUS | Status: DC
  Administered 2017-03-10 – 2017-03-12 (×3): 40 mg via SUBCUTANEOUS
  Filled 2017-03-09 (×3): qty 0.4

## 2017-03-09 MED ORDER — DIPHENHYDRAMINE HCL 50 MG/ML IJ SOLN
INTRAMUSCULAR | Status: AC
  Filled 2017-03-09: qty 1

## 2017-03-09 MED ORDER — SODIUM CHLORIDE 0.9 % IV BOLUS (SEPSIS): 1000.0000 mL | Freq: Once | INTRAVENOUS | Status: DC

## 2017-03-09 MED ORDER — ONDANSETRON HCL 4 MG/2ML IJ SOLN
4.0000 mg | Freq: Four times a day (QID) | INTRAMUSCULAR | Status: DC | PRN
  Administered 2017-03-10 (×2): 4 mg via INTRAVENOUS
  Filled 2017-03-09 (×2): qty 2

## 2017-03-09 MED ORDER — VANCOMYCIN HCL IN DEXTROSE 1-5 GM/200ML-% IV SOLN: 1000.0000 mg | INTRAVENOUS | Status: DC

## 2017-03-09 MED ORDER — IOPAMIDOL (ISOVUE-300) INJECTION 61%
INTRAVENOUS | Status: AC
  Administered 2017-03-09: 80 mL via INTRAVENOUS
  Filled 2017-03-09: qty 100

## 2017-03-09 MED ORDER — ACETAMINOPHEN 500 MG PO TABS
1000.0000 mg | ORAL_TABLET | Freq: Once | ORAL | Status: AC
  Administered 2017-03-09: 1000 mg via ORAL
  Filled 2017-03-09: qty 2

## 2017-03-09 MED ORDER — METRONIDAZOLE IN NACL 5-0.79 MG/ML-% IV SOLN
500.0000 mg | Freq: Three times a day (TID) | INTRAVENOUS | Status: DC
  Administered 2017-03-09 – 2017-03-10 (×2): 500 mg via INTRAVENOUS
  Filled 2017-03-09 (×2): qty 100

## 2017-03-09 MED ORDER — LEVOTHYROXINE SODIUM 88 MCG PO TABS
88.0000 ug | ORAL_TABLET | Freq: Every day | ORAL | Status: DC
  Administered 2017-03-10 – 2017-03-13 (×4): 88 ug via ORAL
  Filled 2017-03-09 (×5): qty 1

## 2017-03-09 MED ORDER — VANCOMYCIN HCL IN DEXTROSE 1-5 GM/200ML-% IV SOLN
1000.0000 mg | Freq: Once | INTRAVENOUS | Status: AC
  Administered 2017-03-09: 1000 mg via INTRAVENOUS
  Filled 2017-03-09: qty 200

## 2017-03-09 MED ORDER — SODIUM CHLORIDE 0.9 % IV BOLUS (SEPSIS)
1000.0000 mL | Freq: Once | INTRAVENOUS | Status: AC
  Administered 2017-03-09: 1000 mL via INTRAVENOUS

## 2017-03-09 MED ORDER — LEVOFLOXACIN IN D5W 750 MG/150ML IV SOLN
750.0000 mg | INTRAVENOUS | Status: DC
  Administered 2017-03-10: 750 mg via INTRAVENOUS
  Filled 2017-03-09: qty 150

## 2017-03-09 MED ORDER — PIPERACILLIN-TAZOBACTAM 3.375 G IVPB 30 MIN
3.3750 g | Freq: Once | INTRAVENOUS | Status: AC
  Administered 2017-03-09: 3.375 g via INTRAVENOUS
  Filled 2017-03-09: qty 50

## 2017-03-09 MED ORDER — SODIUM CHLORIDE 0.9 % IV SOLN
1000.0000 mL | INTRAVENOUS | Status: DC
  Administered 2017-03-09 – 2017-03-11 (×5): 1000 mL via INTRAVENOUS

## 2017-03-09 MED ORDER — NITROGLYCERIN 0.4 MG SL SUBL
0.4000 mg | SUBLINGUAL_TABLET | SUBLINGUAL | Status: DC | PRN
  Administered 2017-03-09: 0.4 mg via SUBLINGUAL
  Filled 2017-03-09: qty 1

## 2017-03-09 MED ORDER — PIPERACILLIN-TAZOBACTAM 3.375 G IVPB: 3.3750 g | Freq: Three times a day (TID) | INTRAVENOUS | Status: DC

## 2017-03-09 NOTE — H&P (Signed)
History and Physical    Cindy [name] IRJ:[id number] DOB: Apr 20, 1930 DOA: 03/09/2017  PCP: Henrine Screws, MD  Patient coming from: Home  I have personally briefly reviewed patient's old medical records in [place]  Chief Complaint: N/V/D  HPI: Cindy [name] is a 83 y.o. female with medical history significant of anal cancer, s/p radiation and chemo.  Patient presents to the ED with 3 day history of abd pain, and 1 day history of N/V/D.  Prior to coming to ED patient has also developed chest pain and R shoulder pain.  Symptoms are intermittent but are severe.  Not able to tolerate any fluids or food today due to N/V.  Few episodes of diarrhea, no blood in vomit nor stools.    ED Course: Fever 101.x, WBC 19.x, lactate initially 4.x, clears to 2.x after IVF.  Started on zosyn / vanc, has allergic reaction with local hives to one of the two of these and they are stopped.  Tylenol for fever.  CT abd/pelvis shows nothing acute.  Troponin is bumped at 0.25, repeat 4 hours later is 0.33.   Review of Systems: As per HPI otherwise 10 point review of systems negative.   Past Medical History:  Diagnosis Date  . Arthritis   . Cancer ([place])    skin  . Colon cancer ([place]) 08/31/13   invasive squamous cell  . Deaf, left   . Depression   . GERD (gastroesophageal reflux disease)   . History of radiation therapy 10/10/13-11/23/13   anal canal 54GY/  . Hyperlipemia   . Hypertension   . Hypothyroidism   . PONV (postoperative nausea and vomiting)   . Skin cancer    Shingles 2012 Bad case    Past Surgical History:  Procedure Laterality Date  . ABDOMINAL HYSTERECTOMY    . APPENDECTOMY    . CESAREAN SECTION     2  . CHOLECYSTECTOMY    . COLONOSCOPY  08/31/13   invasive squamous cell colon  . ERCP    . ESOPHAGOGASTRODUODENOSCOPY  10/20/2012   Procedure: ESOPHAGOGASTRODUODENOSCOPY (EGD);  Surgeon: Arta Silence, MD;  Location: [facility];  Service: Endoscopy;  Laterality:  Left;  . EYE SURGERY     cataracts  . KNEE ARTHROSCOPY  05/11/2012   Procedure: ARTHROSCOPY KNEE;  Surgeon: Hessie Dibble, MD;  Location: [facility];  Service: Orthopedics;  Laterality: Right;  left knee medial menisectomy and chondroplasty  . THYROIDECTOMY, PARTIAL    . TONSILLECTOMY       reports that she has never smoked. She has never used smokeless tobacco. She reports that she does not drink alcohol or use drugs.  Allergies  Allergen Reactions  . Hydrocodone Nausea Only  . Morphine And Related Hives  . Vancomycin     Hives either to zosyn or to vanc.  Marland Kitchen Zosyn [Piperacillin Sod-Tazobactam So]     Hives either to zosyn or to vanc.    Family History  Problem Relation Age of Onset  . Kidney disease Mother   . Heart attack Father   . Heart disease Father   . Cancer Sister     breast  . Cancer Sister     ovarian  . Cancer Sister     melanoma  . Cancer Daughter     breast     Prior to Admission medications   Medication Sig Start Date End Date Taking? Authorizing Provider  aspirin-acetaminophen-caffeine (EXCEDRIN MIGRAINE) [phone] MG tablet Take 1 tablet by mouth every  6 (six) hours as needed for headache (back pain).   Yes Historical Provider, MD  FLUoxetine (PROZAC) 40 MG capsule Take 40 mg by mouth every morning.    Yes Historical Provider, MD  KLOR-CON M20 20 MEQ tablet Take 20 mEq by mouth 2 (two) times daily.  02/11/17  Yes Historical Provider, MD  levothyroxine (SYNTHROID, LEVOTHROID) 88 MCG tablet Take 88 mcg by mouth daily before breakfast.   Yes Historical Provider, MD  Multiple Vitamin (MULTIVITAMIN WITH MINERALS) TABS tablet Take 1 tablet by mouth daily. 10/27/13  Yes Christina P Rama, MD  PREMARIN 0.3 MG tablet Take 0.3 mg by mouth daily. 08/14/16  Yes Historical Provider, MD  omeprazole (PRILOSEC) 20 MG capsule Take 20 mg by mouth daily. 02/04/17   Historical Provider, MD    Physical Exam: Vitals:   03/09/17 1629 03/09/17 1838 03/09/17  2114 03/09/17 2200  BP:  (!) 162/68 136/63 (!) 145/71  Pulse:  72 (!) 59 (!) 57  Resp:  (!) 25 (!) 21 (!) 21  Temp: (!) 101.5 F (38.6 C)     TempSrc: Rectal     SpO2:  100% 100% 99%  Weight:      Height:        Constitutional: NAD, calm, comfortable Eyes: PERRL, lids and conjunctivae normal ENMT: Mucous membranes are moist. Posterior pharynx clear of any exudate or lesions.Normal dentition.  Neck: normal, supple, no masses, no thyromegaly Respiratory: clear to auscultation bilaterally, no wheezing, no crackles. Normal respiratory effort. No accessory muscle use.  Cardiovascular: Regular rate and rhythm, no murmurs / rubs / gallops. No extremity edema. 2+ pedal pulses. No carotid bruits.  Abdomen: no tenderness, no masses palpated. No hepatosplenomegaly. Bowel sounds positive.  Musculoskeletal: no clubbing / cyanosis. No joint deformity upper and lower extremities. Good ROM, no contractures. Normal muscle tone.  Skin: no rashes, lesions, ulcers. No induration Neurologic: CN 2-12 grossly intact. Sensation intact, DTR normal. Strength 5/5 in all 4.  Psychiatric: Normal judgment and insight. Alert and oriented x 3. Normal mood.    Labs on Admission: I have personally reviewed following labs and imaging studies  CBC:  Recent Labs Lab 03/09/17 1646  WBC 19.5*  HGB 13.8  HCT 39.0  MCV 92.4  PLT 703   Basic Metabolic Panel:  Recent Labs Lab 03/09/17 1646  NA 135  K 3.7  CL 100*  CO2 21*  GLUCOSE 137*  BUN 14  CREATININE 1.12*  CALCIUM 9.7   GFR: Estimated Creatinine Clearance: 34.4 mL/min (A) (by C-G formula based on SCr of 1.12 mg/dL (H)). Liver Function Tests:  Recent Labs Lab 03/09/17 1646  AST 44*  ALT 26  ALKPHOS 51  BILITOT 1.2  PROT 8.1  ALBUMIN 4.2    Recent Labs Lab 03/09/17 1646  LIPASE 19   No results for input(s): AMMONIA in the last 168 hours. Coagulation Profile: No results for input(s): INR, PROTIME in the last 168 hours. Cardiac  Enzymes:  Recent Labs Lab 03/09/17 2033  TROPONINI 0.33*   BNP (last 3 results) No results for input(s): PROBNP in the last 8760 hours. HbA1C: No results for input(s): HGBA1C in the last 72 hours. CBG: No results for input(s): GLUCAP in the last 168 hours. Lipid Profile: No results for input(s): CHOL, HDL, LDLCALC, TRIG, CHOLHDL, LDLDIRECT in the last 72 hours. Thyroid Function Tests: No results for input(s): TSH, T4TOTAL, FREET4, T3FREE, THYROIDAB in the last 72 hours. Anemia Panel: No results for input(s): VITAMINB12, FOLATE, FERRITIN, TIBC, IRON, RETICCTPCT  in the last 72 hours. Urine analysis:    Component Value Date/Time   COLORURINE STRAW (A) 03/09/2017 1651   APPEARANCEUR CLEAR 03/09/2017 1651   LABSPEC 1.006 03/09/2017 1651   LABSPEC 1.020 12/02/2013 1329   PHURINE 9.0 (H) 03/09/2017 1651   GLUCOSEU NEGATIVE 03/09/2017 1651   GLUCOSEU Negative 12/02/2013 1329   HGBUR NEGATIVE 03/09/2017 1651   BILIRUBINUR NEGATIVE 03/09/2017 1651   BILIRUBINUR Negative 12/02/2013 1329   KETONESUR 5 (A) 03/09/2017 1651   PROTEINUR NEGATIVE 03/09/2017 1651   UROBILINOGEN 0.2 12/24/2013 1938   UROBILINOGEN 0.2 12/02/2013 1329   NITRITE NEGATIVE 03/09/2017 1651   LEUKOCYTESUR NEGATIVE 03/09/2017 1651   LEUKOCYTESUR Trace 12/02/2013 1329    Radiological Exams on Admission: Dg Chest 2 View  Result Date: 03/09/2017 CLINICAL DATA:  Code sepsis, shortness of breath recently, nausea, vomiting over past several weeks, abdominal pain soreness and tenderness, history hypertension, colon cancer, GERD EXAM: CHEST  2 VIEW COMPARISON:  02/08/2017 FINDINGS: Enlargement of cardiac silhouette. Mediastinal contours and pulmonary vascularity normal. Minimal chronic peribronchial thickening. No definite acute infiltrate, pleural effusion or pneumothorax. Osseous structures unremarkable. IMPRESSION: Enlargement of cardiac silhouette. Minimal chronic bronchitic changes without infiltrate. Electronically  Signed   By: Lavonia Dana M.D.   On: 03/09/2017 17:41   Ct Abdomen Pelvis W Contrast  Result Date: 03/09/2017 CLINICAL DATA:  Acute onset of nausea, vomiting and diarrhea. Lower abdominal pain and tenderness. Initial encounter. EXAM: CT ABDOMEN AND PELVIS WITH CONTRAST TECHNIQUE: Multidetector CT imaging of the abdomen and pelvis was performed using the standard protocol following bolus administration of intravenous contrast. CONTRAST:  80 mL ISOVUE-300 IOPAMIDOL (ISOVUE-300) INJECTION 61% COMPARISON:  CT of the abdomen and pelvis from 10/13/2016, and PET/CT performed 09/23/2013 FINDINGS: Lower chest: Mild scarring is noted at the lung bases. Scattered coronary artery calcification is seen. Hepatobiliary: The liver is unremarkable in appearance. The patient is status post cholecystectomy, with clips noted at the gallbladder fossa. The common bile duct remains normal in caliber. Pancreas: The pancreas is within normal limits. Spleen: The spleen is unremarkable in appearance. Adrenals/Urinary Tract: The adrenal glands are unremarkable in appearance. Nonspecific perinephric stranding is noted bilaterally. Bilateral renal pelvicaliectasis remains within normal limits, without significant hydronephrosis. No distal obstructing stones are seen. No nonobstructing renal stones are identified. Stomach/Bowel: The stomach is unremarkable in appearance. The small bowel is within normal limits. The patient is status post appendectomy. Mild diverticulosis is noted along the descending and sigmoid colon, without evidence of diverticulitis. Vascular/Lymphatic: Scattered calcification is seen along the abdominal aorta and its branches. The abdominal aorta is otherwise grossly unremarkable. The inferior vena cava is grossly unremarkable. No retroperitoneal lymphadenopathy is seen. No pelvic sidewall lymphadenopathy is identified. Reproductive: The bladder is moderately distended and grossly unremarkable. The patient is status post  hysterectomy. No suspicious adnexal masses are seen. Other: Previously noted hypermetabolic nodules along the anterior pelvic wall are again seen, measuring up to 1.3 cm in size. This may reflect treated disease or possibly recurrent metastasis. The previously noted anorectal mass has apparently resolved, with vague nonspecific edema noted about the anorectal canal. Musculoskeletal: No acute osseous abnormalities are identified. Disc space narrowing is noted at L5-S1, with underlying facet disease. The visualized musculature is unremarkable in appearance. IMPRESSION: 1. Nodules again noted along the anterior pelvic wall, measuring up to 1.3 cm in size. These were previously seen hypermetabolic on PET/CT, and may reflect sequelae of treated disease or possibly recurrent metastasis. Sampling under ultrasound guidance could be considered for  further evaluation, as deemed clinically appropriate. 2. Previously noted anorectal mass has apparently resolved, with vague nonspecific edema noted about the anorectal canal. Would correlate for any associated symptoms, and follow-up as to whether a scheduled PET/CT is already planned. 3. Scattered aortic atherosclerosis. 4. Mild diverticulosis along the descending and sigmoid colon, without evidence of diverticulitis. 5. Scattered coronary artery calcification. Electronically Signed   By: Garald Balding M.D.   On: 03/09/2017 19:40    EKG: Independently reviewed.  Assessment/Plan Principal Problem:   Sepsis ([place]) Active Problems:   Anal cancer ([facility])   Nausea vomiting and diarrhea   Elevated troponin    1. Sepsis, N/V/D - 1. Suspect intra-abdominal source 2. IVF 3. Changing ABx to cipro flagyl due to hives to either zosyn or vanc 4. Tylenol PRN fever 5. Cultures pending 6. Checking C.Diff 7. Zofran PRN 2. Elevated trop, with CP - 1. Will try NTG sub lingual since BP 703J systolic currently 2. Tele monitor 3. Serial trops 4. Fentanyl PRN (allergic to  morphine) 3. h/o Anal cancer - currently just undergoing surveillance, looks to be in possible remission at this point still.  DVT prophylaxis: Lovenox Code Status: Full Family Communication: Daughter at bedside Disposition Plan: Home after admit Consults called: None Admission status: Admit to inpatient   Etta Quill DO [facility] Pager [phone]  If 7AM-7PM, please contact day team taking care of patient [email or web address] Password [facility]  03/09/2017, 10:56 PM

## 2017-03-09 NOTE — ED Notes (Signed)
Writer attempted to in and out cath the pt and was unsuccessful.The pt was crying, moaning, and grabbing her chest stating that her chest hurt. Pt's visitors came in from the hallway and got upset that the pt was crying, and told staff to not cath the pt if we "couldn't get it, and to find someone who knows how to do it". RN made aware.

## 2017-03-09 NOTE — ED Notes (Signed)
BRI NT ATTEMPTED TO PERFORM IN AND OUT. NOT SUCCESSFUL. EDPA ALEX AWARE. FAMILY REQUEST NOT TO BE PERFORMED AGAIN. FEMALE URINAL GIVEN. PT ABLE TO URINATE

## 2017-03-09 NOTE — ED Triage Notes (Signed)
Per [facility]- N/V without fever for several days. Lower quad stomach pain for several pain. Chest wall pain relieved after vomiting. Vomited bile only. Zofran 4mg  IVP given in route. EKG NSR

## 2017-03-09 NOTE — Progress Notes (Signed)
Pharmacy Antibiotic Note  Cindy [name] is a 83 y.o. female who presented to [facility] ED on 03/09/2017 with nausea, vomiting, and abdominal pain. Pharmacy has been consulted for Vancomydin and Zosyn dosing for sepsis secondary to possible intra-abdominal infection.  Plan: Vancomycin 1g IV q24h. Plan for Vancomycin trough level at steady state. Goal trough level 15-20 mcg/mL.  Zosyn 3.375g IV x 1 over 30 minutes, then Zosyn 3.375g IV q8h (infuse over 4 hours). Monitor renal function, cultures, clinical course.   Height: 5\' 6"  (167.6 cm) Weight: 150 lb (68 kg) IBW/kg (Calculated) : 59.3  Temp (24hrs), Avg:99.9 F (37.7 C), Min:98.3 F (36.8 C), Max:101.5 F (38.6 C)   Recent Labs Lab 03/09/17 1646 03/09/17 1652  WBC 19.5*  --   CREATININE 1.12*  --   LATICACIDVEN  --  4.37*    Estimated Creatinine Clearance: 34.4 mL/min (A) (by C-G formula based on SCr of 1.12 mg/dL (H)).    Allergies  Allergen Reactions  . Hydrocodone Nausea Only  . Morphine And Related Hives    Antimicrobials this admission: 3/19 >> Vancomycin >> 3/19 >> Zosyn >>  Dose adjustments this admission: --  Microbiology results: 3/19 BCx: sent   Thank you for allowing pharmacy to be a part of this patient's care.   Lindell Spar, PharmD, BCPS Pager: [phone] 03/09/2017 4:47 PM

## 2017-03-09 NOTE — ED Notes (Signed)
Patient transported to X-ray 

## 2017-03-09 NOTE — Progress Notes (Signed)
Pharmacy Antibiotic Note  Cindy [name] is a 83 y.o. female who presented to [facility] ED on 03/09/2017 with nausea, vomiting, and abdominal pain. Pharmacy has been consulted for Levaquin dosing for sepsis secondary to possible intra-abdominal infection.  Patient was started on Vancomycin and Zosyn in ED but developed Hives, unsure which medication patient is allergic to.   Plan: Levaquin 750 mg IV q48h Flagyl 500 mg IV q8h (MD) Monitor renal function, cultures, clinical course.   Height: 5\' 6"  (167.6 cm) Weight: 150 lb (68 kg) IBW/kg (Calculated) : 59.3  Temp (24hrs), Avg:99.9 F (37.7 C), Min:98.3 F (36.8 C), Max:101.5 F (38.6 C)   Recent Labs Lab 03/09/17 1646 03/09/17 1652 03/09/17 1944 03/09/17 2033  WBC 19.5*  --   --   --   CREATININE 1.12*  --   --   --   LATICACIDVEN  --  4.37* 3.18* 2.6*    Estimated Creatinine Clearance: 34.4 mL/min (A) (by C-G formula based on SCr of 1.12 mg/dL (H)).    Allergies  Allergen Reactions  . Hydrocodone Nausea Only  . Morphine And Related Hives  . Vancomycin     Hives either to zosyn or to vanc.  Marland Kitchen Zosyn [Piperacillin Sod-Tazobactam So]     Hives either to zosyn or to vanc.    Antimicrobials this admission: 3/19 >> Vancomycin >>3/19 3/19 >> Zosyn >> 3/19 3/19 >>Flagyl >> 3/19 >> Levaquin >>  Dose adjustments this admission: --  Microbiology results: 3/19 BCx: sent   Thank you for allowing pharmacy to be a part of this patient's care.   Dorrene German 11:12 PM 03/09/2017

## 2017-03-09 NOTE — ED Provider Notes (Signed)
[facility] DEPT Provider Note   CSN: [id number] Arrival date & time: 03/09/17  1549     History   Chief Complaint Chief Complaint  Patient presents with  . Nausea  . Emesis  . Abdominal Pain  . Chest Pain    HPI Cindy [name] is a 83 y.o. female with history of GERD, resolved anal cancer who presents with a three-day history of abdominal pain and one day history of nausea, vomiting, and diarrhea. She reports her abdominal pain is in her lower abdomen. Her pain is been constant. She reports intermittent vomiting of bile. Patient has not been able tolerate any food or fluids today. Patient has had a few episodes of diarrhea. No blood in her stools. Patient is unsure if she has had any fevers. She has also had a right-sided chest pain that began today. She denies any shortness of breath. Patient states she has also had urinary frequency. Patient was given Zofran en route which helped her nausea. Patient recently returned from a trip from [place] and traveled via airplane. No cough or sore throat.  HPI  Past Medical History:  Diagnosis Date  . Arthritis   . Cancer ([place])    skin  . Colon cancer ([place]) 08/31/13   invasive squamous cell  . Deaf, left   . Depression   . GERD (gastroesophageal reflux disease)   . History of radiation therapy 10/10/13-11/23/13   anal canal 54GY/  . Hyperlipemia   . Hypertension   . Hypothyroidism   . PONV (postoperative nausea and vomiting)   . Skin cancer    Shingles 2012 Bad case    Patient Active Problem List   Diagnosis Date Noted  . Sepsis ([place]) 03/09/2017  . Nausea vomiting and diarrhea 03/09/2017  . Elevated troponin 03/09/2017  . Chest pain 12/25/2013  . Dyspnea 12/25/2013  . Restless leg syndrome 12/25/2013  . Mucositis 10/27/2013  . Hypophosphatemia 10/27/2013  . Protein-calorie malnutrition, severe ([place]) 10/25/2013  . Thrombocytopenia ([place]) 10/25/2013  . Hypomagnesemia 10/24/2013  . Febrile neutropenia ([place]) 10/23/2013  .  Anal cancer ([place]) 09/09/2013  . Colon cancer ([place]) 08/31/2013  . Colitis, acute 03/17/2013  . Gastroparesis 10/22/2012  . Hand pain, left 10/22/2012  . Anxiety 10/18/2012  . Hyperventilation syndrome 10/18/2012  . Headache(784.0) 10/18/2012  . Neck pain on right side 10/18/2012  . Hypertension 10/18/2012  . GERD (gastroesophageal reflux disease) 10/18/2012  . Hyperlipidemia 10/18/2012  . Insomnia 10/18/2012  . Hearing loss sensory, bilateral 10/18/2012  . 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 Weakness 10/17/2012  . Hyponatremia 10/17/2012  . Hypokalemia 10/17/2012  . Hypothyroidism 10/17/2012  . Fatigue 10/17/2012    Past Surgical History:  Procedure Laterality Date  . ABDOMINAL HYSTERECTOMY    . APPENDECTOMY    . CESAREAN SECTION     2  . CHOLECYSTECTOMY    . COLONOSCOPY  08/31/13   invasive squamous cell colon  . ERCP    . ESOPHAGOGASTRODUODENOSCOPY  10/20/2012   Procedure: ESOPHAGOGASTRODUODENOSCOPY (EGD);  Surgeon: Arta Silence, MD;  Location: [facility];  Service: Endoscopy;  Laterality: Left;  . EYE SURGERY     cataracts  . KNEE ARTHROSCOPY  05/11/2012   Procedure: ARTHROSCOPY KNEE;  Surgeon: Hessie Dibble, MD;  Location: [facility];  Service: Orthopedics;  Laterality: Right;  left knee medial menisectomy and chondroplasty  . THYROIDECTOMY, PARTIAL    . TONSILLECTOMY      OB History    No data available  Home Medications    Prior to Admission medications   Medication Sig Start Date End Date Taking? Authorizing Provider  aspirin-acetaminophen-caffeine (EXCEDRIN MIGRAINE) [phone] MG tablet Take 1 tablet by mouth every 6 (six) hours as needed for headache (back pain).   Yes Historical Provider, MD  FLUoxetine (PROZAC) 40 MG capsule Take 40 mg by mouth every morning.    Yes Historical Provider, MD  KLOR-CON M20 20 MEQ tablet Take 20 mEq by mouth 2 (two) times daily.  02/11/17  Yes Historical Provider, MD  levothyroxine  (SYNTHROID, LEVOTHROID) 88 MCG tablet Take 88 mcg by mouth daily before breakfast.   Yes Historical Provider, MD  Multiple Vitamin (MULTIVITAMIN WITH MINERALS) TABS tablet Take 1 tablet by mouth daily. 10/27/13  Yes Christina P Rama, MD  PREMARIN 0.3 MG tablet Take 0.3 mg by mouth daily. 08/14/16  Yes Historical Provider, MD  omeprazole (PRILOSEC) 20 MG capsule Take 20 mg by mouth daily. 02/04/17   Historical Provider, MD    Family History Family History  Problem Relation Age of Onset  . Kidney disease Mother   . Heart attack Father   . Heart disease Father   . Cancer Sister     breast  . Cancer Sister     ovarian  . Cancer Sister     melanoma  . Cancer Daughter     breast    Social History Social History  Substance Use Topics  . Smoking status: Never Smoker  . Smokeless tobacco: Never Used  . Alcohol use No     Allergies   Hydrocodone; Morphine and related; Vancomycin; and Zosyn [piperacillin sod-tazobactam so]   Review of Systems Review of Systems  Constitutional: Positive for appetite change and fever. Negative for chills.  HENT: Negative for facial swelling and sore throat.   Respiratory: Negative for shortness of breath.   Cardiovascular: Positive for chest pain.  Gastrointestinal: Positive for abdominal pain, diarrhea, nausea and vomiting. Negative for blood in stool.  Genitourinary: Negative for dysuria.  Musculoskeletal: Negative for back pain.  Skin: Negative for rash and wound.  Neurological: Negative for headaches.  Psychiatric/Behavioral: The patient is not nervous/anxious.      Physical Exam Updated Vital Signs BP 138/63   Pulse 62   Temp (!) 101.5 F (38.6 C) (Rectal)   Resp 16   Ht 5\' 6"  (1.676 m)   Wt 68 kg   SpO2 98%   BMI 24.21 kg/m   Physical Exam  Constitutional: She appears well-developed and well-nourished. No distress.  HENT:  Head: Normocephalic and atraumatic.  Mouth/Throat: Oropharynx is clear and moist. No oropharyngeal  exudate.  Eyes: Conjunctivae are normal. Pupils are equal, round, and reactive to light. Right eye exhibits no discharge. Left eye exhibits no discharge. No scleral icterus.  Neck: Normal range of motion. Neck supple. No thyromegaly present.  Cardiovascular: Normal rate, regular rhythm, normal heart sounds and intact distal pulses.  Exam reveals no gallop and no friction rub.   No murmur heard. Pulmonary/Chest: Effort normal and breath sounds normal. No stridor. No respiratory distress. She has no wheezes. She has no rales. She exhibits tenderness (R sided).  Abdominal: Soft. Bowel sounds are normal. She exhibits no distension. There is tenderness in the right lower quadrant, suprapubic area and left lower quadrant. There is no rebound, no guarding and no CVA tenderness.  Musculoskeletal: She exhibits no edema.  Lymphadenopathy:    She has no cervical adenopathy.  Neurological: She is alert. Coordination normal.  Skin: Skin is  warm and dry. No rash noted. She is not diaphoretic. No pallor.  Psychiatric: She has a normal mood and affect.  Nursing note and vitals reviewed.    ED Treatments / Results  Labs (all labs ordered are listed, but only abnormal results are displayed) Labs Reviewed  COMPREHENSIVE METABOLIC PANEL - Abnormal; Notable for the following:       Result Value   Chloride 100 (*)    CO2 21 (*)    Glucose, Bld 137 (*)    Creatinine, Ser 1.12 (*)    AST 44 (*)    GFR calc non Af Amer 44 (*)    GFR calc Af Amer 50 (*)    All other components within normal limits  CBC - Abnormal; Notable for the following:    WBC 19.5 (*)    All other components within normal limits  URINALYSIS, ROUTINE W REFLEX MICROSCOPIC - Abnormal; Notable for the following:    Color, Urine STRAW (*)    pH 9.0 (*)    Ketones, ur 5 (*)    All other components within normal limits  LACTIC ACID, PLASMA - Abnormal; Notable for the following:    Lactic Acid, Venous 2.6 (*)    All other components  within normal limits  TROPONIN I - Abnormal; Notable for the following:    Troponin I 0.33 (*)    All other components within normal limits  I-STAT CG4 LACTIC ACID, ED - Abnormal; Notable for the following:    Lactic Acid, Venous 4.37 (*)    All other components within normal limits  I-STAT TROPOININ, ED - Abnormal; Notable for the following:    Troponin i, poc 0.25 (*)    All other components within normal limits  I-STAT CG4 LACTIC ACID, ED - Abnormal; Notable for the following:    Lactic Acid, Venous 3.18 (*)    All other components within normal limits  CULTURE, BLOOD (ROUTINE X 2)  CULTURE, BLOOD (ROUTINE X 2)  C DIFFICILE QUICK SCREEN W PCR REFLEX  LIPASE, BLOOD  INFLUENZA PANEL BY PCR (TYPE A & B)  PROCALCITONIN  LACTIC ACID, PLASMA  CBC  BASIC METABOLIC PANEL  TROPONIN I  TROPONIN I    EKG  EKG Interpretation  Date/Time:  Monday March 09 2017 18:03:41 EDT Ventricular Rate:  92 PR Interval:    QRS Duration: 86 QT Interval:  392 QTC Calculation: 485 R Axis:   26 Text Interpretation:  Sinus rhythm Low voltage, precordial leads Anteroseptal infarct, old Minimal ST depression, anterolateral leads Confirmed by ALLEN  MD, ANTHONY ([id number]) on 03/09/2017 6:14:47 PM       Radiology Dg Chest 2 View  Result Date: 03/09/2017 CLINICAL DATA:  Code sepsis, shortness of breath recently, nausea, vomiting over past several weeks, abdominal pain soreness and tenderness, history hypertension, colon cancer, GERD EXAM: CHEST  2 VIEW COMPARISON:  02/08/2017 FINDINGS: Enlargement of cardiac silhouette. Mediastinal contours and pulmonary vascularity normal. Minimal chronic peribronchial thickening. No definite acute infiltrate, pleural effusion or pneumothorax. Osseous structures unremarkable. IMPRESSION: Enlargement of cardiac silhouette. Minimal chronic bronchitic changes without infiltrate. Electronically Signed   By: Lavonia Dana M.D.   On: 03/09/2017 17:41   Ct Abdomen Pelvis W  Contrast  Result Date: 03/09/2017 CLINICAL DATA:  Acute onset of nausea, vomiting and diarrhea. Lower abdominal pain and tenderness. Initial encounter. EXAM: CT ABDOMEN AND PELVIS WITH CONTRAST TECHNIQUE: Multidetector CT imaging of the abdomen and pelvis was performed using the standard protocol following bolus administration of  intravenous contrast. CONTRAST:  80 mL ISOVUE-300 IOPAMIDOL (ISOVUE-300) INJECTION 61% COMPARISON:  CT of the abdomen and pelvis from 10/13/2016, and PET/CT performed 09/23/2013 FINDINGS: Lower chest: Mild scarring is noted at the lung bases. Scattered coronary artery calcification is seen. Hepatobiliary: The liver is unremarkable in appearance. The patient is status post cholecystectomy, with clips noted at the gallbladder fossa. The common bile duct remains normal in caliber. Pancreas: The pancreas is within normal limits. Spleen: The spleen is unremarkable in appearance. Adrenals/Urinary Tract: The adrenal glands are unremarkable in appearance. Nonspecific perinephric stranding is noted bilaterally. Bilateral renal pelvicaliectasis remains within normal limits, without significant hydronephrosis. No distal obstructing stones are seen. No nonobstructing renal stones are identified. Stomach/Bowel: The stomach is unremarkable in appearance. The small bowel is within normal limits. The patient is status post appendectomy. Mild diverticulosis is noted along the descending and sigmoid colon, without evidence of diverticulitis. Vascular/Lymphatic: Scattered calcification is seen along the abdominal aorta and its branches. The abdominal aorta is otherwise grossly unremarkable. The inferior vena cava is grossly unremarkable. No retroperitoneal lymphadenopathy is seen. No pelvic sidewall lymphadenopathy is identified. Reproductive: The bladder is moderately distended and grossly unremarkable. The patient is status post hysterectomy. No suspicious adnexal masses are seen. Other: Previously noted  hypermetabolic nodules along the anterior pelvic wall are again seen, measuring up to 1.3 cm in size. This may reflect treated disease or possibly recurrent metastasis. The previously noted anorectal mass has apparently resolved, with vague nonspecific edema noted about the anorectal canal. Musculoskeletal: No acute osseous abnormalities are identified. Disc space narrowing is noted at L5-S1, with underlying facet disease. The visualized musculature is unremarkable in appearance. IMPRESSION: 1. Nodules again noted along the anterior pelvic wall, measuring up to 1.3 cm in size. These were previously seen hypermetabolic on PET/CT, and may reflect sequelae of treated disease or possibly recurrent metastasis. Sampling under ultrasound guidance could be considered for further evaluation, as deemed clinically appropriate. 2. Previously noted anorectal mass has apparently resolved, with vague nonspecific edema noted about the anorectal canal. Would correlate for any associated symptoms, and follow-up as to whether a scheduled PET/CT is already planned. 3. Scattered aortic atherosclerosis. 4. Mild diverticulosis along the descending and sigmoid colon, without evidence of diverticulitis. 5. Scattered coronary artery calcification. Electronically Signed   By: Garald Balding M.D.   On: 03/09/2017 19:40    Procedures Procedures (including critical care time)  CRITICAL CARE Performed by: Frederica Kuster   Total critical care time: 30 minutes  Critical care time was exclusive of separately billable procedures and treating other patients.  Critical care was necessary to treat or prevent imminent or life-threatening deterioration.  Critical care was time spent personally by me on the following activities: development of treatment plan with patient and/or surrogate as well as nursing, discussions with consultants, evaluation of patient's response to treatment, examination of patient, obtaining history from patient or  surrogate, ordering and performing treatments and interventions, ordering and review of laboratory studies, ordering and review of radiographic studies, pulse oximetry and re-evaluation of patient's condition.  Sepsis - Repeat Assessment  Performed at:    6:15pm  Vitals     Blood pressure 132/67, pulse 80, temperature (!) 101.5 F (38.6 C), temperature source Rectal, resp. rate 20, height 5\' 6"  (1.676 m), weight 68 kg, SpO2 100 %.  Heart:     Regular rate and rhythm  Lungs:    CTA  Capillary Refill:   <2 sec  Peripheral Pulse:   Radial  pulse palpable  Skin:     Normal Color      Medications Ordered in ED Medications  0.9 %  sodium chloride infusion (1,000 mLs Intravenous New Bag/Given 03/09/17 1934)  diphenhydrAMINE (BENADRYL) 50 MG/ML injection (not administered)  nitroGLYCERIN (NITROSTAT) SL tablet 0.4 mg (0.4 mg Sublingual Given 03/09/17 2311)  metroNIDAZOLE (FLAGYL) IVPB 500 mg (0 mg Intravenous Stopped 03/10/17 0020)  levofloxacin (LEVAQUIN) IVPB 750 mg (not administered)  enoxaparin (LOVENOX) injection 40 mg (not administered)  sodium chloride flush (NS) 0.9 % injection 3 mL (3 mLs Intravenous Given 03/09/17 2312)  FLUoxetine (PROZAC) capsule 40 mg (not administered)  levothyroxine (SYNTHROID, LEVOTHROID) tablet 88 mcg (not administered)  pantoprazole (PROTONIX) EC tablet 40 mg (not administered)  acetaminophen (TYLENOL) tablet 650 mg (not administered)  ondansetron (ZOFRAN) injection 4 mg (not administered)  sodium chloride 0.9 % bolus 1,000 mL (0 mLs Intravenous Stopped 03/09/17 1733)    And  sodium chloride 0.9 % bolus 1,000 mL (0 mLs Intravenous Stopped 03/09/17 1734)    And  sodium chloride 0.9 % bolus 250 mL (0 mLs Intravenous Stopped 03/09/17 1942)  piperacillin-tazobactam (ZOSYN) IVPB 3.375 g (0 g Intravenous Stopped 03/09/17 1824)  vancomycin (VANCOCIN) IVPB 1000 mg/200 mL premix (0 mg Intravenous Stopped 03/09/17 1820)  fentaNYL (SUBLIMAZE) injection 25 mcg (25 mcg  Intravenous Given 03/09/17 1827)  acetaminophen (TYLENOL) tablet 1,000 mg (1,000 mg Oral Given 03/09/17 1842)  iopamidol (ISOVUE-300) 61 % injection (80 mLs Intravenous Contrast Given 03/09/17 1904)  diphenhydrAMINE (BENADRYL) injection 25 mg (25 mg Intravenous Given 03/09/17 1833)  fentaNYL (SUBLIMAZE) injection 25 mcg (25 mcg Intravenous Given 03/09/17 2139)  fentaNYL (SUBLIMAZE) injection 25 mcg (25 mcg Intravenous Given 03/10/17 0051)     Initial Impression / Assessment and Plan / ED Course  I have reviewed the triage vital signs and the nursing notes.  Pertinent labs & imaging results that were available during my care of the patient were reviewed by me and considered in my medical decision making (see chart for details).     Code sepsis called due to elevated temperature, increased respiratory rate, and WBC count 19.5. Suspected urine or intra-abdominal infection. Initial lactate 4.37. Fluid resuscitation initiated. Broad-spectrum antibiotics, Zosyn and vancomycin initiated. UA and CT abdomen and pelvis pending. Blood cultures pending.  6:30p I was alerted by nursing staff that patient had a hive reaction to vancomycin. 25 mg Benadryl was ordered.   CBC shows WBC 19.5. CMP shows chloride 100, CO2 21, glucose 137, creatinine 1.12, AST 44. Lipase 19. Initial troponin 0.25, repeat 0.33. Potentially due to demand ischemia. EKG shows NSR, minimal ST depression. Initial lactate 4.37, repeat after fluid resuscitation 3.18. Flu swab pending. UA shows 5 ketones, pH 9. CT abdomen pelvis shows no acute cause for symptoms, however finding seen in the past that may need follow-up to oncologist; mild diverticulosis without evidence of diverticulitis. CXR shows enlargement of cardiac silhouette, minimal chronic bronchitic changes without infiltrate. Antibiotics and fluid resuscitation initiated in the ED as stated above. Patient's pain controlled with fentanyl in the ED. Patient's nausea controlled with Zofran  in the ED. Patient also evaluated by Dr. Zenia Resides who guided the patient's management. I discussed patient case with Dr. Alcario Drought with [facility] who will admit the patient for further evaluation and treatment. Patient and family understand and agree with plan.   Final Clinical Impressions(s) / ED Diagnoses   Final diagnoses:  Sepsis ([place])    New Prescriptions New Prescriptions   No medications on file  Frederica Kuster, PA-C 03/10/17 [phone]

## 2017-03-09 NOTE — ED Notes (Signed)
CODE SEPSIS 

## 2017-03-09 NOTE — ED Notes (Signed)
ED Provider at bedside. 

## 2017-03-09 NOTE — ED Provider Notes (Signed)
Medical screening examination/treatment/procedure(s) were conducted as a shared visit with non-physician practitioner(s) and myself.  I personally evaluated the patient during the encounter.   EKG Interpretation None     Patient here with several days of nausea vomiting and diarrhea. Also had lower abdominal discomfort as well as urinary frequency. Denies any cough or congestion. On abdominal exam, she has no peritoneal signs. Code sepsis initiated. Will start on IV antibiotics and labs are pending   Lacretia Leigh, MD 03/09/17 [phone]

## 2017-03-09 NOTE — ED Notes (Signed)
CT AWARE OF MEDICATION REACTION. DELAY WILL RETURN TO REEVALUATE PT

## 2017-03-09 NOTE — ED Notes (Signed)
EKG GIVEN TO EDP ALLEN- NO ORDER GIVEN

## 2017-03-09 NOTE — ED Notes (Signed)
EDPA Provider at bedside. 

## 2017-03-09 NOTE — ED Notes (Signed)
Lactic Acid=4.37, [place] Law notified

## 2017-03-09 NOTE — ED Notes (Signed)
Patient transported to CT 

## 2017-03-09 NOTE — ED Notes (Signed)
PT CURRENTLY ON BEDPAN ATTEMPTING TO OBTAIN UA. AWARE IF UNABLE WILL PERFORM IN AND OUT

## 2017-03-10 DIAGNOSIS — K219 Gastro-esophageal reflux disease without esophagitis: Secondary | ICD-10-CM

## 2017-03-10 LAB — BASIC METABOLIC PANEL
ANION GAP: 9 (ref 5–15)
BUN: 13 mg/dL (ref 6–20)
CHLORIDE: 107 mmol/L (ref 101–111)
CO2: 22 mmol/L (ref 22–32)
Calcium: 8.6 mg/dL — ABNORMAL LOW (ref 8.9–10.3)
Creatinine, Ser: 1.05 mg/dL — ABNORMAL HIGH (ref 0.44–1.00)
GFR calc Af Amer: 55 mL/min — ABNORMAL LOW (ref >60)
GFR calc non Af Amer: 47 mL/min — ABNORMAL LOW (ref >60)
GLUCOSE: 107 mg/dL — AB (ref 65–99)
Potassium: 3.7 mmol/L (ref 3.5–5.1)
Sodium: 138 mmol/L (ref 135–145)

## 2017-03-10 LAB — BLOOD CULTURE ID PANEL (REFLEXED)

## 2017-03-10 LAB — CBC
HEMATOCRIT: 35.5 % — AB (ref 36.0–46.0)
Hemoglobin: 12.5 g/dL (ref 12.0–15.0)
MCH: 32.6 pg (ref 26.0–34.0)
MCHC: 35.2 g/dL (ref 30.0–36.0)
MCV: 92.7 fL (ref 78.0–100.0)
Platelets: 207 10*3/uL (ref 150–400)
RBC: 3.83 MIL/uL — AB (ref 3.87–5.11)
RDW: 13.1 % (ref 11.5–15.5)
WBC: 13.5 10*3/uL — AB (ref 4.0–10.5)

## 2017-03-10 LAB — LACTIC ACID, PLASMA: Lactic Acid, Venous: 2.1 mmol/L (ref 0.5–1.9)

## 2017-03-10 LAB — TROPONIN I
Troponin I: 0.32 ng/mL (ref <0.03)
Troponin I: 0.16 ng/mL (ref <0.03)

## 2017-03-10 LAB — MRSA PCR SCREENING: MRSA BY PCR: NEGATIVE

## 2017-03-10 MED ORDER — SODIUM CHLORIDE 0.9 % IV SOLN
1.0000 g | Freq: Two times a day (BID) | INTRAVENOUS | Status: DC
  Administered 2017-03-10 – 2017-03-12 (×6): 1 g via INTRAVENOUS
  Filled 2017-03-10 (×8): qty 1

## 2017-03-10 MED ORDER — FENTANYL CITRATE (PF) 100 MCG/2ML IJ SOLN
25.0000 ug | Freq: Once | INTRAMUSCULAR | Status: AC
  Administered 2017-03-10: 25 ug via INTRAVENOUS
  Filled 2017-03-10: qty 2

## 2017-03-10 MED ORDER — FENTANYL CITRATE (PF) 100 MCG/2ML IJ SOLN
25.0000 ug | INTRAMUSCULAR | Status: DC | PRN
  Administered 2017-03-10: 25 ug via INTRAVENOUS
  Filled 2017-03-10: qty 2

## 2017-03-10 NOTE — ED Notes (Signed)
Pt refused needle stick for troponin. Said she had been stuck multiple times last night and did not want labs this morning.

## 2017-03-10 NOTE — ED Notes (Signed)
Hospitalist at bedside 

## 2017-03-10 NOTE — ED Notes (Signed)
Bed: ZE09 Expected date:  Expected time:  Means of arrival:  Comments: For room 4
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
## 2017-03-10 NOTE — Progress Notes (Signed)
PHARMACY - PHYSICIAN COMMUNICATION CRITICAL VALUE ALERT - BLOOD CULTURE IDENTIFICATION (BCID)  Results for orders placed or performed during the hospital encounter of 03/09/17  Blood Culture ID Panel (Reflexed) (Collected: 03/09/2017  5:21 PM)  Result Value Ref Range   Enterococcus species NOT DETECTED NOT DETECTED   Listeria monocytogenes NOT DETECTED NOT DETECTED   Staphylococcus species NOT DETECTED NOT DETECTED   Staphylococcus aureus NOT DETECTED NOT DETECTED   Streptococcus species NOT DETECTED NOT DETECTED   Streptococcus agalactiae NOT DETECTED NOT DETECTED   Streptococcus pneumoniae NOT DETECTED NOT DETECTED   Streptococcus pyogenes NOT DETECTED NOT DETECTED   Acinetobacter baumannii NOT DETECTED NOT DETECTED   Enterobacteriaceae species NOT DETECTED NOT DETECTED   Enterobacter cloacae complex NOT DETECTED NOT DETECTED   Escherichia coli NOT DETECTED NOT DETECTED   Klebsiella oxytoca NOT DETECTED NOT DETECTED   Klebsiella pneumoniae NOT DETECTED NOT DETECTED   Proteus species NOT DETECTED NOT DETECTED   Serratia marcescens NOT DETECTED NOT DETECTED   Haemophilus influenzae NOT DETECTED NOT DETECTED   Neisseria meningitidis NOT DETECTED NOT DETECTED   Pseudomonas aeruginosa NOT DETECTED NOT DETECTED   Candida albicans NOT DETECTED NOT DETECTED   Candida glabrata NOT DETECTED NOT DETECTED   Candida krusei NOT DETECTED NOT DETECTED   Candida parapsilosis NOT DETECTED NOT DETECTED   Candida tropicalis NOT DETECTED NOT DETECTED  Gm stain variable rods in aerobic bottle only, BCID no growth.  Name of physician (or Provider) Contacted: Tylene Fantasia  Changes to prescribed antibiotics required: on levaquin and flagyl, no change required.  Dorrene German 03/10/2017  11:06 PM

## 2017-03-11 MED ORDER — DIPHENHYDRAMINE HCL 12.5 MG/5ML PO ELIX
12.5000 mg | ORAL_SOLUTION | Freq: Once | ORAL | Status: AC
  Administered 2017-03-11: 12.5 mg via ORAL
  Filled 2017-03-11: qty 5

## 2017-03-11 NOTE — Care Management Note (Signed)
Case Management Note  Patient Details  Name: Cindy [name] MRN: [id number] Date of Birth: 11-29-1930  Subjective/Objective:  83 y/o f admitted w/Sepsis. From home.                  Action/Plan:d/c plan home.   Expected Discharge Date:   (unknown)               Expected Discharge Plan:  Home/Self Care  In-House Referral:     Discharge planning Services  CM Consult  Post Acute Care Choice:    Choice offered to:     DME Arranged:    DME Agency:     HH Arranged:    HH Agency:     Status of Service:  In process, will continue to follow  If discussed at Long Length of Stay Meetings, dates discussed:    Additional Comments:  Dessa Phi, RN 03/11/2017, 12:56 PM

## 2017-03-11 NOTE — Plan of Care (Signed)
Problem: Skin Integrity: Goal: Risk for impaired skin integrity will decrease Outcome: Completed/Met Date Met: 03/11/17 Pt skin remains intact, has increased mobility

## 2017-03-11 NOTE — Progress Notes (Signed)
PROGRESS NOTE  Cindy [name] RWE:[id number] DOB: 1930/01/18 DOA: 03/09/2017 PCP: Henrine Screws, MD  HPI/Recap of past 24 hours: 83 year female past mental history of rectal cancer in remission and hypothyroidism admitted on 3/19 for abdominal pain with nausea and vomiting. Patient had come back from an out of state trip 1 day prior and upon arrival home, start having abdominal pain along with nausea and vomiting. She also had 1 loose stool. She it's associated fevers and chills and so she came in to emergency further evaluation. There she was noted to have a temperature of 101.5 with low blood pressure and she felt to be in sepsis. Initially she was put on broad-spectrum IV antibiotics which she developed a localized skin reaction to vancomycin/Zosyn without any other systemic symptoms. These antibiotics were stopped and patient was started on Levaquin and Flagyl. Pro calcitonin and lactic acid levels elevated on admission  Over the next few days, patient continued to improve. CT noted some nonspecific edema in the anal rectal canal but no other findings. Blood cultures grew out positive for gram variable rods and 1/4 bottles. In about exchanged over to meropenem. Lactic acidosis level continue to trend downward.  Patient herself feeling better. Decreased abdominal pain. No diarrhea today. Minimal nausea, but still no appetite  Assessment/Plan: Principal Problem:   Sepsis ([place]) secondary to abdominal source. Low clinical suspicion of C. difficile given minimal stooling since admission. May have been nonspecific gastroenteritis. Continue meropenem. Noted positive blood culture, waiting further evaluation. Responded to meropenem. Patient met criteria for sepsis on admission given lactic acidosis, elevated pro calcitonin, hypotension, tachycardia, leukocytosis Active Problems:   GERD (gastroesophageal reflux disease)    Nausea vomiting and diarrhea: Slowly improving   Elevated troponin:  Troponin trend flat, felt to be secondary to demand ischemia. Not acute coronary syndrome.  Squamous cell carcinoma of the anus: In remission, finished radiation and chemotherapy.  Code Status: Full code   Family Communication: Daughter is at the bedside   Disposition Plan: Anticipate discharge in the next 1-2 days once cultures back and patient pain and diarrhea fully resolved and tolerating some by mouth    Consultants:  None   Procedures:  None   Antimicrobials:   IV vancomycin/Zosyn attempted-discontinued  IV Flagyl/Levaquin 3/19-3/20  IV meropenem 3/20-present  DVT prophylaxis:  Lovenox   Objective: Vitals:   03/10/17 2227 03/10/17 2315 03/11/17 0524 03/11/17 1542  BP: (!) 184/73 (!) 162/70 (!) 169/77 (!) 189/63  Pulse: 70  69 65  Resp: '18  18 19  '$ Temp: 98.9 F (37.2 C)  98.1 F (36.7 C) 98.6 F (37 C)  TempSrc: Oral  Oral Oral  SpO2: 96%  95% 98%  Weight:      Height:        Intake/Output Summary (Last 24 hours) at 03/11/17 1808 Last data filed at 03/11/17 1542  Gross per 24 hour  Intake          3010.83 ml  Output             2176 ml  Net           834.83 ml   Filed Weights   03/09/17 1620 03/10/17 1611  Weight: 68 kg (150 lb) 69.1 kg (152 lb 5.4 oz)    Exam:   General:  Alert and oriented 3, no acute distress   Cardiovascular: Regular rate and rhythm, S1-S2   Respiratory: Clear to auscultation bilaterally   Abdomen: Soft, nontender, nontender, positive bowel sounds  Musculoskeletal: No clubbing or cyanosis or edema   Skin: No skin breaks, tears or lesions  Psychiatry: Patient is appropriate, no evidence of psychoses    Data Reviewed: CBC:  Recent Labs Lab 03/09/17 1646 03/10/17 0522  WBC 19.5* 13.5*  HGB 13.8 12.5  HCT 39.0 35.5*  MCV 92.4 92.7  PLT 242 893   Basic Metabolic Panel:  Recent Labs Lab 03/09/17 1646 03/10/17 0522  NA 135 138  K 3.7 3.7  CL 100* 107  CO2 21* 22  GLUCOSE 137* 107*  BUN 14 13    CREATININE 1.12* 1.05*  CALCIUM 9.7 8.6*   GFR: Estimated Creatinine Clearance: 38.2 mL/min (A) (by C-G formula based on SCr of 1.05 mg/dL (H)). Liver Function Tests:  Recent Labs Lab 03/09/17 1646  AST 44*  ALT 26  ALKPHOS 51  BILITOT 1.2  PROT 8.1  ALBUMIN 4.2    Recent Labs Lab 03/09/17 1646  LIPASE 19   No results for input(s): AMMONIA in the last 168 hours. Coagulation Profile: No results for input(s): INR, PROTIME in the last 168 hours. Cardiac Enzymes:  Recent Labs Lab 03/09/17 2033 03/10/17 0331 03/10/17 1619  TROPONINI 0.33* 0.32* 0.16*   BNP (last 3 results) No results for input(s): PROBNP in the last 8760 hours. HbA1C: No results for input(s): HGBA1C in the last 72 hours. CBG: No results for input(s): GLUCAP in the last 168 hours. Lipid Profile: No results for input(s): CHOL, HDL, LDLCALC, TRIG, CHOLHDL, LDLDIRECT in the last 72 hours. Thyroid Function Tests: No results for input(s): TSH, T4TOTAL, FREET4, T3FREE, THYROIDAB in the last 72 hours. Anemia Panel: No results for input(s): VITAMINB12, FOLATE, FERRITIN, TIBC, IRON, RETICCTPCT in the last 72 hours. Urine analysis:    Component Value Date/Time   COLORURINE STRAW (A) 03/09/2017 1651   APPEARANCEUR CLEAR 03/09/2017 1651   LABSPEC 1.006 03/09/2017 1651   LABSPEC 1.020 12/02/2013 1329   PHURINE 9.0 (H) 03/09/2017 1651   GLUCOSEU NEGATIVE 03/09/2017 1651   GLUCOSEU Negative 12/02/2013 1329   HGBUR NEGATIVE 03/09/2017 1651   BILIRUBINUR NEGATIVE 03/09/2017 1651   BILIRUBINUR Negative 12/02/2013 1329   KETONESUR 5 (A) 03/09/2017 1651   PROTEINUR NEGATIVE 03/09/2017 1651   UROBILINOGEN 0.2 12/24/2013 1938   UROBILINOGEN 0.2 12/02/2013 1329   NITRITE NEGATIVE 03/09/2017 1651   LEUKOCYTESUR NEGATIVE 03/09/2017 1651   LEUKOCYTESUR Trace 12/02/2013 1329   Sepsis Labs: '@LABRCNTIP'$ (procalcitonin:4,lacticidven:4)  ) Recent Results (from the past 240 hour(s))  Blood Culture (routine x 2)      Status: None (Preliminary result)   Collection Time: 03/09/17  4:46 PM  Result Value Ref Range Status   Specimen Description BLOOD RIGHT ANTECUBITAL  Final   Special Requests BOTTLES DRAWN AEROBIC AND ANAEROBIC 5CC  Final   Culture   Final    NO GROWTH 2 DAYS Performed at [facility], [place] [address], [place], [place] [address]    Report Status PENDING  Incomplete  Blood Culture (routine x 2)     Status: Abnormal (Preliminary result)   Collection Time: 03/09/17  5:21 PM  Result Value Ref Range Status   Specimen Description BLOOD LEFT ANTECUBITAL  Final   Special Requests BOTTLES DRAWN AEROBIC AND ANAEROBIC 5CC  Final   Culture  Setup Time (A)  Final    GRAM VARIABLE ROD AEROBIC BOTTLE ONLY CRITICAL RESULT CALLED TO, READ BACK BY AND VERIFIED WITH: B GREEN PHARMD 2223 03/10/17 A BROWNING Performed at [facility], [place] [address]    Culture GRAM VARIABLE ROD (A)  Final   Report Status PENDING  Incomplete  Blood Culture ID Panel (Reflexed)     Status: None   Collection Time: 03/09/17  5:21 PM  Result Value Ref Range Status   Enterococcus species NOT DETECTED NOT DETECTED Final   Listeria monocytogenes NOT DETECTED NOT DETECTED Final   Staphylococcus species NOT DETECTED NOT DETECTED Final   Staphylococcus aureus NOT DETECTED NOT DETECTED Final   Streptococcus species NOT DETECTED NOT DETECTED Final   Streptococcus agalactiae NOT DETECTED NOT DETECTED Final   Streptococcus pneumoniae NOT DETECTED NOT DETECTED Final   Streptococcus pyogenes NOT DETECTED NOT DETECTED Final   Acinetobacter baumannii NOT DETECTED NOT DETECTED Final   Enterobacteriaceae species NOT DETECTED NOT DETECTED Final   Enterobacter cloacae complex NOT DETECTED NOT DETECTED Final   Escherichia coli NOT DETECTED NOT DETECTED Final   Klebsiella oxytoca NOT DETECTED NOT DETECTED Final   Klebsiella pneumoniae NOT DETECTED NOT DETECTED Final   Proteus species NOT DETECTED NOT  DETECTED Final   Serratia marcescens NOT DETECTED NOT DETECTED Final   Haemophilus influenzae NOT DETECTED NOT DETECTED Final   Neisseria meningitidis NOT DETECTED NOT DETECTED Final   Pseudomonas aeruginosa NOT DETECTED NOT DETECTED Final   Candida albicans NOT DETECTED NOT DETECTED Final   Candida glabrata NOT DETECTED NOT DETECTED Final   Candida krusei NOT DETECTED NOT DETECTED Final   Candida parapsilosis NOT DETECTED NOT DETECTED Final   Candida tropicalis NOT DETECTED NOT DETECTED Final    Comment: Performed at [facility], [address], [place], [place] [address]  MRSA PCR Screening     Status: None   Collection Time: 03/10/17 11:50 AM  Result Value Ref Range Status   MRSA by PCR NEGATIVE NEGATIVE Final    Comment:        The GeneXpert MRSA Assay (FDA approved for NASAL specimens only), is one component of a comprehensive MRSA colonization surveillance program. It is not intended to diagnose MRSA infection nor to guide or monitor treatment for MRSA infections.       Studies: No results found.  Scheduled Meds: . enoxaparin (LOVENOX) injection  40 mg Subcutaneous Q24H  . FLUoxetine  40 mg Oral q morning - 10a  . levothyroxine  88 mcg Oral QAC breakfast  . meropenem (MERREM) IV  1 g Intravenous Q12H  . pantoprazole  40 mg Oral Daily  . sodium chloride flush  3 mL Intravenous Q12H    Continuous Infusions: . sodium chloride 1,000 mL (03/10/17 2227)     LOS: 2 days     Annita Brod, MD [facility] Pager [phone]  If 7PM-7AM, please contact night-coverage [email or web address] Password [facility] 03/11/2017, 6:08 PM

## 2017-03-11 NOTE — Plan of Care (Signed)
Problem: Pain Managment: Goal: General experience of comfort will improve Outcome: Completed/Met Date Met: 03/11/17 Pt denies

## 2017-03-12 DIAGNOSIS — I10 Essential (primary) hypertension: Secondary | ICD-10-CM

## 2017-03-12 LAB — COMPREHENSIVE METABOLIC PANEL
ALBUMIN: 3 g/dL — AB (ref 3.5–5.0)
ALT: 20 U/L (ref 14–54)
AST: 26 U/L (ref 15–41)
Alkaline Phosphatase: 37 U/L — ABNORMAL LOW (ref 38–126)
Anion gap: 10 (ref 5–15)
BUN: 6 mg/dL (ref 6–20)
CHLORIDE: 105 mmol/L (ref 101–111)
CO2: 23 mmol/L (ref 22–32)
CREATININE: 0.8 mg/dL (ref 0.44–1.00)
Calcium: 8.2 mg/dL — ABNORMAL LOW (ref 8.9–10.3)
GFR calc Af Amer: >60 mL/min (ref >60)
GFR calc non Af Amer: >60 mL/min (ref >60)
GLUCOSE: 90 mg/dL (ref 65–99)
POTASSIUM: 2.8 mmol/L — AB (ref 3.5–5.1)
Sodium: 138 mmol/L (ref 135–145)
Total Bilirubin: 0.6 mg/dL (ref 0.3–1.2)
Total Protein: 6.1 g/dL — ABNORMAL LOW (ref 6.5–8.1)

## 2017-03-12 LAB — CBC
HEMATOCRIT: 30.4 % — AB (ref 36.0–46.0)
Hemoglobin: 10.6 g/dL — ABNORMAL LOW (ref 12.0–15.0)
MCH: 33 pg (ref 26.0–34.0)
MCHC: 34.9 g/dL (ref 30.0–36.0)
MCV: 94.7 fL (ref 78.0–100.0)
Platelets: 171 10*3/uL (ref 150–400)
RBC: 3.21 MIL/uL — ABNORMAL LOW (ref 3.87–5.11)
RDW: 13.1 % (ref 11.5–15.5)
WBC: 5.1 10*3/uL (ref 4.0–10.5)

## 2017-03-12 LAB — BRAIN NATRIURETIC PEPTIDE: B Natriuretic Peptide: 215.3 pg/mL — ABNORMAL HIGH (ref 0.0–100.0)

## 2017-03-12 LAB — LACTIC ACID, PLASMA: Lactic Acid, Venous: 0.9 mmol/L (ref 0.5–1.9)

## 2017-03-12 LAB — TROPONIN I: Troponin I: 0.06 ng/mL (ref <0.03)

## 2017-03-12 MED ORDER — SODIUM CHLORIDE 0.9 % IV SOLN: 1000.0000 mL | INTRAVENOUS | Status: DC

## 2017-03-12 MED ORDER — POTASSIUM CHLORIDE CRYS ER 20 MEQ PO TBCR
20.0000 meq | EXTENDED_RELEASE_TABLET | Freq: Two times a day (BID) | ORAL | Status: DC
  Administered 2017-03-12 – 2017-03-13 (×3): 20 meq via ORAL
  Filled 2017-03-12 (×3): qty 1

## 2017-03-12 MED ORDER — HYDRALAZINE HCL 25 MG PO TABS
25.0000 mg | ORAL_TABLET | Freq: Three times a day (TID) | ORAL | Status: DC
  Administered 2017-03-12 – 2017-03-13 (×4): 25 mg via ORAL
  Filled 2017-03-12 (×4): qty 1

## 2017-03-12 MED ORDER — FUROSEMIDE 40 MG PO TABS
40.0000 mg | ORAL_TABLET | ORAL | Status: AC
  Administered 2017-03-12: 40 mg via ORAL
  Filled 2017-03-12: qty 1

## 2017-03-12 NOTE — Progress Notes (Signed)
PROGRESS NOTE  Cindy [name] HCW:[id number] DOB: 02-Jun-1930 DOA: 03/09/2017 PCP: Henrine Screws, MD  HPI/Recap of past 24 hours: 83 year female past mental history of rectal cancer in remission and hypothyroidism admitted on 3/19 for abdominal pain with nausea and vomiting. Patient had come back from an out of state trip 1 day prior and upon arrival home, start having abdominal pain along with nausea and vomiting. She also had 1 loose stool. She it's associated fevers and chills and so she came in to emergency further evaluation. There she was noted to have a temperature of 101.5 with low blood pressure and she felt to be in sepsis. Initially she was put on broad-spectrum IV antibiotics which she developed a localized skin reaction to vancomycin/Zosyn without any other systemic symptoms. These antibiotics were stopped and patient was started on Levaquin and Flagyl. Procalcitonin and lactic acid levels elevated on admission  Over the next few days, patient continued to improve. CT noted some nonspecific edema in the anal rectal canal but no other findings. Blood cultures grew out positive for gram variable rods and 1/4 bottles. Abx exchanged over to meropenem. Lactic acid level continue to trend downward.  Patient herself feeling better. No further diarrhea. Minimal abdominal pain. Getting hungry.  Assessment/Plan: Principal Problem:   Sepsis ([place]) secondary to abdominal source. Low clinical suspicion of C. difficile given minimal stooling since admission. May have been nonspecific gastroenteritis. Continue meropenem. Noted positive blood culture, waiting further clarification.. Patient met criteria for sepsis on admission given lactic acidosis, elevated pro calcitonin, hypotension, tachycardia, leukocytosis. Now resolved. Advancing diet Active Problems:   GERD (gastroesophageal reflux disease)    Nausea vomiting and diarrhea: Resolving   Elevated troponin: Troponin trend flat, felt to be  secondary to demand ischemia. Not acute coronary syndrome. Further lowered today.  Essential hypertension: Pressures up some. Have stopped IV fluids now to diarrhea resolved. Resume home meds. We'll give 1 dose of Lasix.  BNP within normal limits  Squamous cell carcinoma of the anus: In remission, finished radiation and chemotherapy.  Code Status: Full code   Family Communication: Daughter is at the bedside   Disposition Plan: Anticipate discharge tomorrow   Consultants:  None   Procedures:  None   Antimicrobials:   IV vancomycin/Zosyn attempted-discontinued  IV Flagyl/Levaquin 3/19-3/20  IV meropenem 3/20-present  DVT prophylaxis:  Lovenox   Objective: Vitals:   03/11/17 1542 03/11/17 2110 03/12/17 0400 03/12/17 0630  BP: (!) 189/63 (!) 199/97 (!) 153/72 (!) 188/59  Pulse: 65 65 (!) 58 61  Resp: '19 19  20  '$ Temp: 98.6 F (37 C) 98.5 F (36.9 C)  98.5 F (36.9 C)  TempSrc: Oral Oral  Oral  SpO2: 98% 99%  97%  Weight:      Height:        Intake/Output Summary (Last 24 hours) at 03/12/17 1121 Last data filed at 03/12/17 0726  Gross per 24 hour  Intake             2940 ml  Output             1601 ml  Net             1339 ml   Filed Weights   03/09/17 1620 03/10/17 1611  Weight: 68 kg (150 lb) 69.1 kg (152 lb 5.4 oz)    Exam:   General:  Alert and oriented 3, no acute distress   Cardiovascular: Regular rate and rhythm, S1-S2   Respiratory: Clear to  auscultation bilaterally   Abdomen: Soft, nontender, nontender, positive bowel sounds   Musculoskeletal: No clubbing or cyanosis or edema   Skin: No skin breaks, tears or lesions  Psychiatry: Patient is appropriate, no evidence of psychoses    Data Reviewed: CBC:  Recent Labs Lab 03/09/17 1646 03/10/17 0522 03/12/17 0527  WBC 19.5* 13.5* 5.1  HGB 13.8 12.5 10.6*  HCT 39.0 35.5* 30.4*  MCV 92.4 92.7 94.7  PLT 242 207 147   Basic Metabolic Panel:  Recent Labs Lab 03/09/17 1646  03/10/17 0522 03/12/17 0527  NA 135 138 138  K 3.7 3.7 2.8*  CL 100* 107 105  CO2 21* 22 23  GLUCOSE 137* 107* 90  BUN '14 13 6  '$ CREATININE 1.12* 1.05* 0.80  CALCIUM 9.7 8.6* 8.2*   GFR: Estimated Creatinine Clearance: 50.2 mL/min (by C-G formula based on SCr of 0.8 mg/dL). Liver Function Tests:  Recent Labs Lab 03/09/17 1646 03/12/17 0527  AST 44* 26  ALT 26 20  ALKPHOS 51 37*  BILITOT 1.2 0.6  PROT 8.1 6.1*  ALBUMIN 4.2 3.0*    Recent Labs Lab 03/09/17 1646  LIPASE 19   No results for input(s): AMMONIA in the last 168 hours. Coagulation Profile: No results for input(s): INR, PROTIME in the last 168 hours. Cardiac Enzymes:  Recent Labs Lab 03/09/17 2033 03/10/17 0331 03/10/17 1619 03/12/17 0848  TROPONINI 0.33* 0.32* 0.16* 0.06*   BNP (last 3 results) No results for input(s): PROBNP in the last 8760 hours. HbA1C: No results for input(s): HGBA1C in the last 72 hours. CBG: No results for input(s): GLUCAP in the last 168 hours. Lipid Profile: No results for input(s): CHOL, HDL, LDLCALC, TRIG, CHOLHDL, LDLDIRECT in the last 72 hours. Thyroid Function Tests: No results for input(s): TSH, T4TOTAL, FREET4, T3FREE, THYROIDAB in the last 72 hours. Anemia Panel: No results for input(s): VITAMINB12, FOLATE, FERRITIN, TIBC, IRON, RETICCTPCT in the last 72 hours. Urine analysis:    Component Value Date/Time   COLORURINE STRAW (A) 03/09/2017 1651   APPEARANCEUR CLEAR 03/09/2017 1651   LABSPEC 1.006 03/09/2017 1651   LABSPEC 1.020 12/02/2013 1329   PHURINE 9.0 (H) 03/09/2017 1651   GLUCOSEU NEGATIVE 03/09/2017 1651   GLUCOSEU Negative 12/02/2013 1329   HGBUR NEGATIVE 03/09/2017 1651   BILIRUBINUR NEGATIVE 03/09/2017 1651   BILIRUBINUR Negative 12/02/2013 1329   KETONESUR 5 (A) 03/09/2017 1651   PROTEINUR NEGATIVE 03/09/2017 1651   UROBILINOGEN 0.2 12/24/2013 1938   UROBILINOGEN 0.2 12/02/2013 1329   NITRITE NEGATIVE 03/09/2017 1651   LEUKOCYTESUR NEGATIVE  03/09/2017 1651   LEUKOCYTESUR Trace 12/02/2013 1329   Sepsis Labs: '@LABRCNTIP'$ (procalcitonin:4,lacticidven:4)  ) Recent Results (from the past 240 hour(s))  Blood Culture (routine x 2)     Status: None (Preliminary result)   Collection Time: 03/09/17  4:46 PM  Result Value Ref Range Status   Specimen Description BLOOD RIGHT ANTECUBITAL  Final   Special Requests BOTTLES DRAWN AEROBIC AND ANAEROBIC 5CC  Final   Culture   Final    NO GROWTH 3 DAYS Performed at [facility], [place] [address], [place], [place] [address]    Report Status PENDING  Incomplete  Blood Culture (routine x 2)     Status: Abnormal (Preliminary result)   Collection Time: 03/09/17  5:21 PM  Result Value Ref Range Status   Specimen Description BLOOD LEFT ANTECUBITAL  Final   Special Requests BOTTLES DRAWN AEROBIC AND ANAEROBIC 5CC  Final   Culture  Setup Time (A)  Final    GRAM VARIABLE ROD AEROBIC BOTTLE ONLY CRITICAL RESULT CALLED TO, READ BACK BY AND VERIFIED WITH: B GREEN PHARMD 2223 03/10/17 A BROWNING Performed at [facility], [place] [address], [place], [place] [address]    Culture GRAM VARIABLE ROD (A)  Final   Report Status PENDING  Incomplete  Blood Culture ID Panel (Reflexed)     Status: None   Collection Time: 03/09/17  5:21 PM  Result Value Ref Range Status   Enterococcus species NOT DETECTED NOT DETECTED Final   Listeria monocytogenes NOT DETECTED NOT DETECTED Final   Staphylococcus species NOT DETECTED NOT DETECTED Final   Staphylococcus aureus NOT DETECTED NOT DETECTED Final   Streptococcus species NOT DETECTED NOT DETECTED Final   Streptococcus agalactiae NOT DETECTED NOT DETECTED Final   Streptococcus pneumoniae NOT DETECTED NOT DETECTED Final   Streptococcus pyogenes NOT DETECTED NOT DETECTED Final   Acinetobacter baumannii NOT DETECTED NOT DETECTED Final   Enterobacteriaceae species NOT DETECTED NOT DETECTED Final   Enterobacter cloacae complex NOT DETECTED NOT DETECTED Final    Escherichia coli NOT DETECTED NOT DETECTED Final   Klebsiella oxytoca NOT DETECTED NOT DETECTED Final   Klebsiella pneumoniae NOT DETECTED NOT DETECTED Final   Proteus species NOT DETECTED NOT DETECTED Final   Serratia marcescens NOT DETECTED NOT DETECTED Final   Haemophilus influenzae NOT DETECTED NOT DETECTED Final   Neisseria meningitidis NOT DETECTED NOT DETECTED Final   Pseudomonas aeruginosa NOT DETECTED NOT DETECTED Final   Candida albicans NOT DETECTED NOT DETECTED Final   Candida glabrata NOT DETECTED NOT DETECTED Final   Candida krusei NOT DETECTED NOT DETECTED Final   Candida parapsilosis NOT DETECTED NOT DETECTED Final   Candida tropicalis NOT DETECTED NOT DETECTED Final    Comment: Performed at [facility], [place] [address], [place], [place] [address]  MRSA PCR Screening     Status: None   Collection Time: 03/10/17 11:50 AM  Result Value Ref Range Status   MRSA by PCR NEGATIVE NEGATIVE Final    Comment:        The GeneXpert MRSA Assay (FDA approved for NASAL specimens only), is one component of a comprehensive MRSA colonization surveillance program. It is not intended to diagnose MRSA infection nor to guide or monitor treatment for MRSA infections.       Studies: No results found.  Scheduled Meds: . enoxaparin (LOVENOX) injection  40 mg Subcutaneous Q24H  . FLUoxetine  40 mg Oral q morning - 10a  . furosemide  40 mg Oral NOW  . hydrALAZINE  25 mg Oral Q8H  . levothyroxine  88 mcg Oral QAC breakfast  . meropenem (MERREM) IV  1 g Intravenous Q12H  . pantoprazole  40 mg Oral Daily  . potassium chloride  20 mEq Oral BID  . sodium chloride flush  3 mL Intravenous Q12H    Continuous Infusions:    LOS: 3 days     Annita Brod, MD [facility] Pager [phone]  If 7PM-7AM, please contact night-coverage [email or web address] Password [facility] 03/12/2017, 11:21 AM

## 2017-03-12 NOTE — Progress Notes (Signed)
Paged MD to inform of BP of 188/59. Pt asymptomatic.

## 2017-03-12 NOTE — Progress Notes (Signed)
MD paged about consistently elevated blood pressures and that pt states she was taking cardizem at home but decided to stop it recently (without informing her PCP). She denies headache or chest pain.

## 2017-03-13 LAB — BASIC METABOLIC PANEL
ANION GAP: 7 (ref 5–15)
BUN: 12 mg/dL (ref 6–20)
CO2: 26 mmol/L (ref 22–32)
Calcium: 8.7 mg/dL — ABNORMAL LOW (ref 8.9–10.3)
Chloride: 106 mmol/L (ref 101–111)
Creatinine, Ser: 0.95 mg/dL (ref 0.44–1.00)
GFR calc Af Amer: >60 mL/min (ref >60)
GFR, EST NON AFRICAN AMERICAN: 53 mL/min — AB (ref >60)
GLUCOSE: 100 mg/dL — AB (ref 65–99)
POTASSIUM: 3.1 mmol/L — AB (ref 3.5–5.1)
Sodium: 139 mmol/L (ref 135–145)

## 2017-03-13 LAB — CULTURE, BLOOD (ROUTINE X 2)

## 2017-03-13 MED ORDER — CIPROFLOXACIN HCL 500 MG PO TABS: 500.0000 mg | ORAL_TABLET | Freq: Two times a day (BID) | ORAL | 0 refills | Status: DC

## 2017-03-13 MED ORDER — METRONIDAZOLE 500 MG PO TABS: 500.0000 mg | ORAL_TABLET | Freq: Three times a day (TID) | ORAL | 0 refills | Status: DC

## 2017-03-13 MED ORDER — HYDRALAZINE HCL 25 MG PO TABS: 25.0000 mg | ORAL_TABLET | Freq: Three times a day (TID) | ORAL | 0 refills | Status: DC

## 2017-03-13 NOTE — Discharge Instructions (Signed)
Sepsis, Adult Sepsis is a serious bodily reaction to an infection. The infection that causes sepsis may be from a bacteria, a virus, a fungus, or a parasite. Sepsis can result from an infection in any part of the body. Infections that commonly lead to sepsis include skin, lung, and urinary tract infections. Sepsis is a medical emergency that requires immediate treatment at the hospital. In severe cases, it can lead to septic shock. Shock can weaken the heart and cause blood pressure to drop. This can make the central nervous system and the body's organs to stop working. What are the causes? This condition is caused by a severe reaction to a bacterial, viral, fungal, or parasitic infection. The germs that most commonly lead to sepsis include:  Escherichia coli (E. coli).  Staphylococcus aureus (staph).  The most common infections that lead to sepsis include infections of:  The skin.  The lung (pneumonia).  The gut.  The kidneys (urinary tract infection).  What increases the risk? You are more likely to develop this condition if:  You have a weakened disease-fighting (immune) system.  You are 65 or older.  You are female.  You had surgery, or you have been hospitalized.  You have a catheter, breathing tube, or drainage tubes inserted into your body.  You are not getting enough nutrients from food (are malnourished).  You have other long-term (chronic) diseases, including: ? Cancer. ? AIDS. ? Liver disease. ? Lung disease. ? Diabetes.  You have severe burns or injuries.  You inject drugs.  You have heart valve problems.  What are the signs or symptoms? Symptoms of this condition may include:  Fever.  Chills or feeling very cold.  Fast heart rate (tachycardia).  Rapid breathing (hyperventilation).  Shortness of breath.  Confusion or light-headedness.  Changes in skin color. Your skin may look blotchy, pale, or blue.  Cool, clammy skin or sweaty skin.  Skin  rash.  Nausea and vomiting.  Urinating much less than usual.  How is this diagnosed? This condition is diagnosed based on:  Your symptoms.  Your medical history.  A physical exam.  Other tests may also be done to find out the cause of the infection and how severe the sepsis is. These tests may include:  Blood tests.  Urine tests.  Swabs from other areas of the body that may have an infection. These samples may be tested (cultured) to find out what type of bacteria is causing the infection.  Chest X-ray to check for pneumonia. Other imaging tests, such as a CT scan, may also be done.  Lumbar puncture. This is a procedure to remove a small amount of the fluid that surrounds the brain and spinal cord. The fluid is then examined for infection.  How is this treated? This condition is treated in a hospital with antibiotic medicines. You may also receive:  Fluids through an IV tube.  Oxygen and breathing assistance.  Kidney dialysis. This process cleans the blood if the kidneys have failed.  Surgery to remove infected tissue.  Medicines to increase your blood pressure.  Nutrients to correct imbalances in basic body function (metabolism). This may involve receiving important salts and minerals (electrolytes) through an IV and having your blood sugar level adjusted.  Steroid medicines to control your body's reaction to the infection.  Follow these instructions at home: Medicines  Take over-the-counter and prescription medicines only as told by your health care provider.  If you were prescribed an antibiotic or anti-fungal medicine, take it   as told by your health care provider. Do not stop taking the antibiotic or anti-fungal medicine even if you start to feel better. Activity  Rest and gradually return to your normal activities. Ask your health care provider what activities are safe for you.  Try to set small, achievable goals each week, such as dressing yourself,  bathing, or walking up stairs. It may take a while to rebuild your strength.  Try to exercise regularly, if you feel healthy enough to do so. Ask your health care provider what exercises are safe for you. General instructions  Drink enough fluid to keep your urine clear or pale yellow.  Eat a healthy, balanced diet. This includes plenty of fruits and vegetables, whole grains, and lowfat (lean) proteins. Ask your health care provider if you should avoid certain foods.  Keep all follow-up visits as told by your health care provider. This is important. Contact a health care provider if:  You do not feel like you are getting better or regaining strength.  You are having trouble coping with your recovery.  You frequently feel tired.  You feel worse or do not seem to get better after surgery.  You think you may have an infection after surgery. Get help right away if:  You have any symptoms of sepsis.  You have difficulty breathing.  You have a rapid or skipping heartbeat.  You become confused.  You have a high fever.  Your skin becomes blotchy, pale, or blue. These symptoms may represent a serious problem that is an emergency. Do not wait to see if the symptoms will go away. Get medical help right away. Call your local emergency services (911 in the [place] Summary  Sepsis is a medical emergency that requires immediate treatment at the hospital.  This condition is caused by a severe reaction to a bacterial, viral, fungal, or parasitic infection.  This condition is treated in a hospital with antibiotics. Treatment may also include IV fluids, breathing assistance, and kidney dialysis.  If you were prescribed an antibiotic or anti-fungal medicine, take it as told by your health care provider. Do not stop taking the antibiotic or anti-fungal medicine even if you start to feel better. This information is not intended to replace advice given to you by your health care provider. Make  sure you discuss any questions you have with your health care provider. Document Released: 09/06/2003 Document Revised: 11/11/2016 Document Reviewed: 11/11/2016 Elsevier Interactive Patient Education  2017 Elsevier Inc[employer]  

## 2017-03-13 NOTE — Care Management Note (Signed)
Case Management Note  Patient Details  Name: Cindy [name] MRN: [id number] Date of Birth: Mar 04, 1930  Subjective/Objective: d/c plan home. No CM needs.                   Action/Plan:d/c home.   Expected Discharge Date:   (unknown)               Expected Discharge Plan:  Home/Self Care  In-House Referral:     Discharge planning Services  CM Consult  Post Acute Care Choice:    Choice offered to:     DME Arranged:    DME Agency:     HH Arranged:    [place] Agency:     Status of Service:  Completed, signed off  If discussed at [employer] of Stay Meetings, dates discussed:    Additional Comments:  Dessa Phi, RN 03/13/2017, 10:39 AM

## 2017-03-13 NOTE — Discharge Summary (Signed)
Discharge Summary  Cindy [name] TRR:[id number] DOB: 02/12/1930  PCP: Henrine Screws, MD  Admit date: 03/09/2017 Discharge date: 03/13/2017  Time spent: 25 minutes  Recommendations for Outpatient Follow-up:  1. New medication: Hydralazine 25 mg by mouth every 8 hours 2. New medication: Cipro 500 mg by mouth twice a day 5 days 3. New medication: Flagyl 500 mg every 8 hours 5 days 4. Patient will follow up with her PCP in the next one month   Discharge Diagnoses:  Active Hospital Problems   Diagnosis Date Noted  . Sepsis ([place]) 03/09/2017  . Nausea vomiting and diarrhea 03/09/2017  . Elevated troponin 03/09/2017  . Anal cancer ([place]) 09/09/2013  . GERD (gastroesophageal reflux disease) 10/18/2012    Resolved Hospital Problems   Diagnosis Date Noted Date Resolved  No resolved problems to display.    Discharge Condition: Improved, being discharged home   Diet recommendation: Low-sodium   Vitals:   03/13/17 0537 03/13/17 0816  BP: (!) 164/71 (!) 156/76  Pulse: (!) 54 61  Resp: 18 16  Temp: 98 F (36.7 C)     History of present illness:  83 year female past mental history of rectal cancer in remission and hypothyroidism admitted on 3/19 for abdominal pain with nausea and vomiting. Patient had come back from an out of state trip 1 day prior and upon arrival home, start having abdominal pain along with nausea and vomiting. She also had 1 loose stool. She it's associated fevers and chills and so she came in to emergency further evaluation. There she was noted to have a temperature of 101.5 with low blood pressure and she felt to be in sepsis. Initially she was put on broad-spectrum IV antibiotics which she developed a localized skin reaction to vancomycin/Zosyn without any other systemic symptoms. These antibiotics were stopped and patient was started on Levaquin and Flagyl. Procalcitonin and lactic acid levels elevated on admission   Hospital Course:  Principal  Problem:   Sepsis ([place]) secondary to abdominal source: Low clinical suspicion of C. difficile given minimal stooling since admission. We have been nonspecific gastroenteritis. Over the next few days, patient continued to improve area CT noted nonspecific edema in the rectal canal But no other findings. Blood cultures grew out positive for gram variable rods in 1/4 bottles. Antibodies were changed over to meropenem. Lactic acid level normalized. Patient met criteria for sepsis on admission given lactic acidosis, elevated pro calcitonin, hypotension, tachycardia and leukocytosis with abdominal source. By day of discharge, patient tolerating by mouth, able to ambulate, vital signs stable, lactic acid level normal and no further diarrhea. No further progression of her blood culture, so we'll treat her for 10 days total of antibiotics, with 5 more days of by mouth Cipro and Flagyl at home.  Active Problems:   GERD (gastroesophageal reflux disease)   Anal cancer ([place]): Squamous cell carcinoma. In remission, finished radiation and chemotherapy.   Nausea vomiting and diarrhea: Secondary to abdominal sepsis. Resolved.    Elevated troponin: Felt to be secondary to demand ischemia. Follow-up troponins continued to trend downward and normalized. This is not ACS.  Essential hypertension: Patient had been on Cardizem in the past for hypertension. She said it made her feel bad so she self stopped this medication months ago. Was noted that during her hospitalization without medicine, her heart rate was right around 60, so she may have had symptoms at home of bradycardia which causes her to stop the Cardizem. Her last, her sugars were elevated ranging  160s-180s during the hospitalization, so started hydralazine 25 mg by mouth every 8 hours and have given her prescription to go home on.   Procedures:  None   Consultations:  None   Discharge Exam: BP (!) 156/76 (BP Location: Left Arm)   Pulse 61   Temp 98 F  (36.7 C) (Oral)   Resp 16   Ht _0  (1.651 m)   Wt 69.1 kg (152 lb 5.4 oz)   SpO2 96%   BMI 25.35 kg/m   General: Alert and oriented 3, no acute distress  Cardiovascular: Regular rate and rhythm, S1-S2  Respiratory: "Clear to Auscultation bilaterally   Discharge Instructions You were cared for by a hospitalist during your hospital stay. If you have any questions about your discharge medications or the care you received while you were in the hospital after you are discharged, you can call the unit and asked to speak with the hospitalist on call if the hospitalist that took care of you is not available. Once you are discharged, your primary care physician will handle any further medical issues. Please note that NO REFILLS for any discharge medications will be authorized once you are discharged, as it is imperative that you return to your primary care physician (or establish a relationship with a primary care physician if you do not have one) for your aftercare needs so that they can reassess your need for medications and monitor your lab values.  Discharge Instructions    Diet - low sodium heart healthy    Complete by:  As directed    Increase activity slowly    Complete by:  As directed      Allergies as of 03/13/2017      Reactions   Vancomycin Hives   Zosyn and vanc given together; unclear which was precipitating med. Appears to have tolerated cephalosporins in the past.   Zosyn [piperacillin Sod-tazobactam So] Hives   Zosyn and vanc given together; unclear which was precipitating med. Appears to have tolerated cephalosporins in the past.   Hydrocodone Nausea Only   Morphine And Related Hives      Medication List    TAKE these medications   aspirin-acetaminophen-caffeine 250-250-65 MG tablet Commonly known as:  EXCEDRIN MIGRAINE Take 1 tablet by mouth every 6 (six) hours as needed for headache (back pain).   ciprofloxacin 500 MG tablet Commonly known as:  CIPRO Take 1  tablet (500 mg total) by mouth 2 (two) times daily.   FLUoxetine 40 MG capsule Commonly known as:  PROZAC Take 40 mg by mouth every morning.   hydrALAZINE 25 MG tablet Commonly known as:  APRESOLINE Take 1 tablet (25 mg total) by mouth every 8 (eight) hours.   KLOR-CON M20 20 MEQ tablet Generic drug:  potassium chloride SA Take 20 mEq by mouth 2 (two) times daily.   levothyroxine 88 MCG tablet Commonly known as:  SYNTHROID, LEVOTHROID Take 88 mcg by mouth daily before breakfast.   metroNIDAZOLE 500 MG tablet Commonly known as:  FLAGYL Take 1 tablet (500 mg total) by mouth 3 (three) times daily.   multivitamin with minerals Tabs tablet Take 1 tablet by mouth daily.   omeprazole 20 MG capsule Commonly known as:  PRILOSEC Take 20 mg by mouth daily.   PREMARIN 0.3 MG tablet Generic drug:  estrogens (conjugated) Take 0.3 mg by mouth daily.      Allergies  Allergen Reactions  . Vancomycin Hives    Zosyn and vanc given together; unclear which was  precipitating med. Appears to have tolerated cephalosporins in the past.  . Zosyn [Piperacillin Sod-Tazobactam So] Hives    Zosyn and vanc given together; unclear which was precipitating med. Appears to have tolerated cephalosporins in the past.  . Hydrocodone Nausea Only  . Morphine And Related Hives   Follow-up Information    GATES,ROBERT NEVILL, MD Follow up in 1 month(s).   Specialty:  Internal Medicine Contact information: [address] [place] [address] [phone]            The results of significant diagnostics from this hospitalization (including imaging, microbiology, ancillary and laboratory) are listed below for reference.    Significant Diagnostic Studies: Dg Chest 2 View  Result Date: 03/09/2017 CLINICAL DATA:  Code sepsis, shortness of breath recently, nausea, vomiting over past several weeks, abdominal pain soreness and tenderness, history hypertension, colon cancer, GERD EXAM:  CHEST  2 VIEW COMPARISON:  02/08/2017 FINDINGS: Enlargement of cardiac silhouette. Mediastinal contours and pulmonary vascularity normal. Minimal chronic peribronchial thickening. No definite acute infiltrate, pleural effusion or pneumothorax. Osseous structures unremarkable. IMPRESSION: Enlargement of cardiac silhouette. Minimal chronic bronchitic changes without infiltrate. Electronically Signed   By: Lavonia Dana M.D.   On: 03/09/2017 17:41   Ct Abdomen Pelvis W Contrast  Result Date: 03/09/2017 CLINICAL DATA:  Acute onset of nausea, vomiting and diarrhea. Lower abdominal pain and tenderness. Initial encounter. EXAM: CT ABDOMEN AND PELVIS WITH CONTRAST TECHNIQUE: Multidetector CT imaging of the abdomen and pelvis was performed using the standard protocol following bolus administration of intravenous contrast. CONTRAST:  80 mL ISOVUE-300 IOPAMIDOL (ISOVUE-300) INJECTION 61% COMPARISON:  CT of the abdomen and pelvis from 10/13/2016, and PET/CT performed 09/23/2013 FINDINGS: Lower chest: Mild scarring is noted at the lung bases. Scattered coronary artery calcification is seen. Hepatobiliary: The liver is unremarkable in appearance. The patient is status post cholecystectomy, with clips noted at the gallbladder fossa. The common bile duct remains normal in caliber. Pancreas: The pancreas is within normal limits. Spleen: The spleen is unremarkable in appearance. Adrenals/Urinary Tract: The adrenal glands are unremarkable in appearance. Nonspecific perinephric stranding is noted bilaterally. Bilateral renal pelvicaliectasis remains within normal limits, without significant hydronephrosis. No distal obstructing stones are seen. No nonobstructing renal stones are identified. Stomach/Bowel: The stomach is unremarkable in appearance. The small bowel is within normal limits. The patient is status post appendectomy. Mild diverticulosis is noted along the descending and sigmoid colon, without evidence of diverticulitis.  Vascular/Lymphatic: Scattered calcification is seen along the abdominal aorta and its branches. The abdominal aorta is otherwise grossly unremarkable. The inferior vena cava is grossly unremarkable. No retroperitoneal lymphadenopathy is seen. No pelvic sidewall lymphadenopathy is identified. Reproductive: The bladder is moderately distended and grossly unremarkable. The patient is status post hysterectomy. No suspicious adnexal masses are seen. Other: Previously noted hypermetabolic nodules along the anterior pelvic wall are again seen, measuring up to 1.3 cm in size. This may reflect treated disease or possibly recurrent metastasis. The previously noted anorectal mass has apparently resolved, with vague nonspecific edema noted about the anorectal canal. Musculoskeletal: No acute osseous abnormalities are identified. Disc space narrowing is noted at L5-S1, with underlying facet disease. The visualized musculature is unremarkable in appearance. IMPRESSION: 1. Nodules again noted along the anterior pelvic wall, measuring up to 1.3 cm in size. These were previously seen hypermetabolic on PET/CT, and may reflect sequelae of treated disease or possibly recurrent metastasis. Sampling under ultrasound guidance could be considered for further evaluation, as deemed clinically appropriate. 2.  Previously noted anorectal mass has apparently resolved, with vague nonspecific edema noted about the anorectal canal. Would correlate for any associated symptoms, and follow-up as to whether a scheduled PET/CT is already planned. 3. Scattered aortic atherosclerosis. 4. Mild diverticulosis along the descending and sigmoid colon, without evidence of diverticulitis. 5. Scattered coronary artery calcification. Electronically Signed   By: Garald Balding M.D.   On: 03/09/2017 19:40    Microbiology: Recent Results (from the past 240 hour(s))  Blood Culture (routine x 2)     Status: None (Preliminary result)   Collection Time: 03/09/17   4:46 PM  Result Value Ref Range Status   Specimen Description BLOOD RIGHT ANTECUBITAL  Final   Special Requests BOTTLES DRAWN AEROBIC AND ANAEROBIC 5CC  Final   Culture   Final    NO GROWTH 3 DAYS Performed at [facility], [address], [place], [place] [address]    Report Status PENDING  Incomplete  Blood Culture (routine x 2)     Status: Abnormal (Preliminary result)   Collection Time: 03/09/17  5:21 PM  Result Value Ref Range Status   Specimen Description BLOOD LEFT ANTECUBITAL  Final   Special Requests BOTTLES DRAWN AEROBIC AND ANAEROBIC 5CC  Final   Culture  Setup Time (A)  Final    GRAM VARIABLE ROD AEROBIC BOTTLE ONLY CRITICAL RESULT CALLED TO, READ BACK BY AND VERIFIED WITH: B GREEN PHARMD [id number] 03/10/17 A BROWNING Performed at [facility], [place] [address] [place], [place] [address]    Culture GRAM VARIABLE ROD (A)  Final   Report Status PENDING  Incomplete  Blood Culture ID Panel (Reflexed)     Status: None   Collection Time: 03/09/17  5:21 PM  Result Value Ref Range Status   Enterococcus species NOT DETECTED NOT DETECTED Final   Listeria monocytogenes NOT DETECTED NOT DETECTED Final   Staphylococcus species NOT DETECTED NOT DETECTED Final   Staphylococcus aureus NOT DETECTED NOT DETECTED Final   Streptococcus species NOT DETECTED NOT DETECTED Final   Streptococcus agalactiae NOT DETECTED NOT DETECTED Final   Streptococcus pneumoniae NOT DETECTED NOT DETECTED Final   Streptococcus pyogenes NOT DETECTED NOT DETECTED Final   Acinetobacter baumannii NOT DETECTED NOT DETECTED Final   Enterobacteriaceae species NOT DETECTED NOT DETECTED Final   Enterobacter cloacae complex NOT DETECTED NOT DETECTED Final   Escherichia coli NOT DETECTED NOT DETECTED Final   Klebsiella oxytoca NOT DETECTED NOT DETECTED Final   Klebsiella pneumoniae NOT DETECTED NOT DETECTED Final   Proteus species NOT DETECTED NOT DETECTED Final   Serratia marcescens NOT DETECTED NOT DETECTED  Final   Haemophilus influenzae NOT DETECTED NOT DETECTED Final   Neisseria meningitidis NOT DETECTED NOT DETECTED Final   Pseudomonas aeruginosa NOT DETECTED NOT DETECTED Final   Candida albicans NOT DETECTED NOT DETECTED Final   Candida glabrata NOT DETECTED NOT DETECTED Final   Candida krusei NOT DETECTED NOT DETECTED Final   Candida parapsilosis NOT DETECTED NOT DETECTED Final   Candida tropicalis NOT DETECTED NOT DETECTED Final    Comment: Performed at [facility], [place] [address], [place], [place] [address]  MRSA PCR Screening     Status: None   Collection Time: 03/10/17 11:50 AM  Result Value Ref Range Status   MRSA by PCR NEGATIVE NEGATIVE Final    Comment:        The GeneXpert MRSA Assay (FDA approved for NASAL specimens only), is one component of a comprehensive MRSA colonization surveillance  program. It is not intended to diagnose MRSA infection nor to guide or monitor treatment for MRSA infections.      Labs: Basic Metabolic Panel:  Recent Labs Lab 03/09/17 1646 03/10/17 0522 03/12/17 0527 03/13/17 0500  NA 135 138 138 139  K 3.7 3.7 2.8* 3.1*  CL 100* 107 105 106  CO2 21* _0 GLUCOSE 137* 107* 90 100*  BUN _1 CREATININE 1.12* 1.05* 0.80 0.95  CALCIUM 9.7 8.6* 8.2* 8.7*   Liver Function Tests:  Recent Labs Lab 03/09/17 1646 03/12/17 0527  AST 44* 26  ALT 26 20  ALKPHOS 51 37*  BILITOT 1.2 0.6  PROT 8.1 6.1*  ALBUMIN 4.2 3.0*    Recent Labs Lab 03/09/17 1646  LIPASE 19   No results for input(s): AMMONIA in the last 168 hours. CBC:  Recent Labs Lab 03/09/17 1646 03/10/17 0522 03/12/17 0527  WBC 19.5* 13.5* 5.1  HGB 13.8 12.5 10.6*  HCT 39.0 35.5* 30.4*  MCV 92.4 92.7 94.7  PLT 242 207 171   Cardiac Enzymes:  Recent Labs Lab 03/09/17 2033 03/10/17 0331 03/10/17 1619 03/12/17 0848  TROPONINI 0.33* 0.32* 0.16* 0.06*   BNP: BNP (last 3 results)  Recent Labs  03/12/17 0527  BNP 215.3*    ProBNP  (last 3 results) No results for input(s): PROBNP in the last 8760 hours.  CBG: No results for input(s): GLUCAP in the last 168 hours.     Signed:  Annita Brod, MD [facility] 03/13/2017, 11:21 AM

## 2017-03-14 LAB — CULTURE, BLOOD (ROUTINE X 2): Culture: NO GROWTH

## 2017-03-17 ENCOUNTER — Ambulatory Visit
Admission: RE | Admit: 2017-03-17 | Discharge: 2017-03-17 | Disposition: A | Discharge status: Home / Self Care | Payer: No Typology Code available for payment source | Source: Ambulatory Visit | Attending: Radiation Oncology | Admitting: Radiation Oncology

## 2017-03-23 ENCOUNTER — Ambulatory Visit
Admission: RE | Admit: 2017-03-23 | Discharge: 2017-03-23 | Disposition: A | Discharge status: Home / Self Care | Payer: Medicare Other | Source: Ambulatory Visit | Attending: Radiation Oncology | Admitting: Radiation Oncology

## 2017-03-23 NOTE — Progress Notes (Deleted)
Radiation Oncology         [phone] ________________________________  Name: Cindy [name] MRN: [id number]  Date: 03/23/2017  DOB: 04/15/1930  Follow-Up Visit Note  CC: Cindy Screws, MD  Cindy Ruff, MD  Diagnosis: Anal Cancer, squamous cell carcinoma  Interval Since Last Radiation: 2 years and 4 months  10/10/2013 through 11/23/2013: The patient was treated to the anal tumor as well as the regional lymph nodes. The primary tumor received 54 gray at 1.8 gray per fraction. The patient's treatment consisted of a IMRT technique with daily image guidance.  Narrative:  The patient returns today for routine follow-up, and has been in surveillance since she completed her treatment in 2014.  On review of systems, the patient reports that she is doing extremely well overall. She denies any rectal bleeding, anal pain, itching, or burning. She states that she is not experiencing any bowel dysfunction, and is not noting any urinary disturbances. She denies any fevers or chills, unintended weight changes, hot flashes or night sweats. She denies any abdominal pain, nausea, vomiting, chest pain or shortness of breath. A complete review of systems is obtained and is otherwise negative.  ALLERGIES:  is allergic to vancomycin; zosyn [piperacillin sod-tazobactam so]; hydrocodone; and morphine and related.  Meds: Current Outpatient Prescriptions  Medication Sig Dispense Refill  . aspirin-acetaminophen-caffeine (EXCEDRIN MIGRAINE) 250-250-65 MG tablet Take 1 tablet by mouth every 6 (six) hours as needed for headache (back pain).    . ciprofloxacin (CIPRO) 500 MG tablet Take 1 tablet (500 mg total) by mouth 2 (two) times daily. 10 tablet 0  . FLUoxetine (PROZAC) 40 MG capsule Take 40 mg by mouth every morning.     . hydrALAZINE (APRESOLINE) 25 MG tablet Take 1 tablet (25 mg total) by mouth every 8 (eight) hours. 90 tablet 0  . KLOR-CON M20 20 MEQ tablet Take 20 mEq by mouth 2 (two) times  daily.   5  . levothyroxine (SYNTHROID, LEVOTHROID) 88 MCG tablet Take 88 mcg by mouth daily before breakfast.    . metroNIDAZOLE (FLAGYL) 500 MG tablet Take 1 tablet (500 mg total) by mouth 3 (three) times daily. 15 tablet 0  . Multiple Vitamin (MULTIVITAMIN WITH MINERALS) TABS tablet Take 1 tablet by mouth daily. 30 tablet 3  . omeprazole (PRILOSEC) 20 MG capsule Take 20 mg by mouth daily.    Marland Kitchen PREMARIN 0.3 MG tablet Take 0.3 mg by mouth daily.     No current facility-administered medications for this encounter.     Physical Findings:  vitals were not taken for this visit. In general this is a well appearing Caucasian female in no acute distress. She is alert and oriented x4 and appropriate throughout the examination. HEENT reveals that the patient is normocephalic, atraumatic. EOMs are intact. PERRLA. Skin is intact without any evidence of gross lesions. Cardiovascular exam reveals a regular rate and rhythm, no clicks rubs or murmurs are auscultated. Chest is clear to auscultation bilaterally. Lymphatic assessment is performed and does not reveal any adenopathy in the cervical, supraclavicular, axillary, or inguinal chains. Abdomen has active bowel sounds in all quadrants and is intact. The abdomen is soft, non tender, non distended. Lower extremities are negative for pretibial pitting edema, deep calf tenderness, cyanosis or clubbing. Pelvic exam reveals normal-appearing external female genitalia, labial, perineal and perianal tissue. No lesions are seen grossly. BX is normal in appearance. Speculum exam reveals a well-healed vaginal cuff without visible abnormalities. Bimanual exam confirms a well healed cuff without palpable  adnexa. Rectovaginal exam does not reveal any induration or nodularity of the septum, there is minimal thinning posteriorly consistent with her previous radiotherapy treatment. No evidence of fistula is identified.   Lab Findings: Lab Results  Component Value Date   WBC  5.1 03/12/2017   HGB 10.6 (L) 03/12/2017   HCT 30.4 (L) 03/12/2017   MCV 94.7 03/12/2017   PLT 171 03/12/2017     Radiographic Findings: Dg Chest 2 View  Result Date: 03/09/2017 CLINICAL DATA:  Code sepsis, shortness of breath recently, nausea, vomiting over past several weeks, abdominal pain soreness and tenderness, history hypertension, colon cancer, GERD EXAM: CHEST  2 VIEW COMPARISON:  02/08/2017 FINDINGS: Enlargement of cardiac silhouette. Mediastinal contours and pulmonary vascularity normal. Minimal chronic peribronchial thickening. No definite acute infiltrate, pleural effusion or pneumothorax. Osseous structures unremarkable. IMPRESSION: Enlargement of cardiac silhouette. Minimal chronic bronchitic changes without infiltrate. Electronically Signed   By: Lavonia Dana M.D.   On: 03/09/2017 17:41   Ct Abdomen Pelvis W Contrast  Result Date: 03/09/2017 CLINICAL DATA:  Acute onset of nausea, vomiting and diarrhea. Lower abdominal pain and tenderness. Initial encounter. EXAM: CT ABDOMEN AND PELVIS WITH CONTRAST TECHNIQUE: Multidetector CT imaging of the abdomen and pelvis was performed using the standard protocol following bolus administration of intravenous contrast. CONTRAST:  80 mL ISOVUE-300 IOPAMIDOL (ISOVUE-300) INJECTION 61% COMPARISON:  CT of the abdomen and pelvis from 10/13/2016, and PET/CT performed 09/23/2013 FINDINGS: Lower chest: Mild scarring is noted at the lung bases. Scattered coronary artery calcification is seen. Hepatobiliary: The liver is unremarkable in appearance. The patient is status post cholecystectomy, with clips noted at the gallbladder fossa. The common bile duct remains normal in caliber. Pancreas: The pancreas is within normal limits. Spleen: The spleen is unremarkable in appearance. Adrenals/Urinary Tract: The adrenal glands are unremarkable in appearance. Nonspecific perinephric stranding is noted bilaterally. Bilateral renal pelvicaliectasis remains within normal  limits, without significant hydronephrosis. No distal obstructing stones are seen. No nonobstructing renal stones are identified. Stomach/Bowel: The stomach is unremarkable in appearance. The small bowel is within normal limits. The patient is status post appendectomy. Mild diverticulosis is noted along the descending and sigmoid colon, without evidence of diverticulitis. Vascular/Lymphatic: Scattered calcification is seen along the abdominal aorta and its branches. The abdominal aorta is otherwise grossly unremarkable. The inferior vena cava is grossly unremarkable. No retroperitoneal lymphadenopathy is seen. No pelvic sidewall lymphadenopathy is identified. Reproductive: The bladder is moderately distended and grossly unremarkable. The patient is status post hysterectomy. No suspicious adnexal masses are seen. Other: Previously noted hypermetabolic nodules along the anterior pelvic wall are again seen, measuring up to 1.3 cm in size. This may reflect treated disease or possibly recurrent metastasis. The previously noted anorectal mass has apparently resolved, with vague nonspecific edema noted about the anorectal canal. Musculoskeletal: No acute osseous abnormalities are identified. Disc space narrowing is noted at L5-S1, with underlying facet disease. The visualized musculature is unremarkable in appearance. IMPRESSION: 1. Nodules again noted along the anterior pelvic wall, measuring up to 1.3 cm in size. These were previously seen hypermetabolic on PET/CT, and may reflect sequelae of treated disease or possibly recurrent metastasis. Sampling under ultrasound guidance could be considered for further evaluation, as deemed clinically appropriate. 2. Previously noted anorectal mass has apparently resolved, with vague nonspecific edema noted about the anorectal canal. Would correlate for any associated symptoms, and follow-up as to whether a scheduled PET/CT is already planned. 3. Scattered aortic atherosclerosis. 4.  Mild diverticulosis along the descending and  sigmoid colon, without evidence of diverticulitis. 5. Scattered coronary artery calcification. Electronically Signed   By: Garald Balding M.D.   On: 03/09/2017 19:40    Impression/Plan: 1.  Squamous Cell Carcinoma of the Anus.     The patient appears to be clinically without evidence of disease. I discussed with the patient the rationale for following her closely however as she is going to be seen again by Dr. Benay Spice and Ned Card, NP in September; I will plan to see her back next March, in 2018. We discussed the in the NCCN guidelines for surveillance of her disease, and have discussed that there is a role for imaging, however the patient and I had a good discussion about the fact that if she were to have a recurrence, she states that she would not pursue any additional therapy. I discussed with her that this is reasonable, and that for the same purpose, she is unwilling to have a colonoscopy/anoscopy. She states she would not seek additional treatment if an anomaly was identified, due to her age for these procedures. I explained to her the risks and benefits of this type of choice, and at the end of the conversation she is very comfortable moving forward expectantly with surveillance visit and exams only. She states agreement and understanding of this. She will also continue these conversations with Dr. Benay Spice. 2. Breast cancer screening.    As above the patient is also not interested in any additional mammography screening as she states that she would not accept any adjuvant therapies or surgery.    Carola Rhine, PAC   This document serves as a record of services personally performed by Shona Simpson, PA-C. It was created on her behalf by Darcus Austin, a trained medical scribe. The creation of this record is based on the scribe's personal observations and the provider's statements to them. This document has been checked and approved by the  attending provider.

## 2017-03-25 DIAGNOSIS — K219 Gastro-esophageal reflux disease without esophagitis: Secondary | ICD-10-CM | POA: Diagnosis not present

## 2017-03-25 DIAGNOSIS — R5383 Other fatigue: Secondary | ICD-10-CM | POA: Diagnosis not present

## 2017-03-25 DIAGNOSIS — Z85048 Personal history of other malignant neoplasm of rectum, rectosigmoid junction, and anus: Secondary | ICD-10-CM | POA: Diagnosis not present

## 2017-03-25 DIAGNOSIS — R195 Other fecal abnormalities: Secondary | ICD-10-CM | POA: Diagnosis not present

## 2017-03-25 DIAGNOSIS — E876 Hypokalemia: Secondary | ICD-10-CM | POA: Diagnosis not present

## 2017-03-25 DIAGNOSIS — R7989 Other specified abnormal findings of blood chemistry: Secondary | ICD-10-CM | POA: Diagnosis not present

## 2017-03-25 DIAGNOSIS — I10 Essential (primary) hypertension: Secondary | ICD-10-CM | POA: Diagnosis not present

## 2017-03-27 ENCOUNTER — Encounter: Payer: Self-pay | Admitting: Interventional Cardiology

## 2017-04-08 DIAGNOSIS — Z0000 Encounter for general adult medical examination without abnormal findings: Secondary | ICD-10-CM | POA: Diagnosis not present

## 2017-04-08 DIAGNOSIS — F329 Major depressive disorder, single episode, unspecified: Secondary | ICD-10-CM | POA: Diagnosis not present

## 2017-04-08 DIAGNOSIS — Z1389 Encounter for screening for other disorder: Secondary | ICD-10-CM | POA: Diagnosis not present
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
## 2017-04-14 ENCOUNTER — Ambulatory Visit (INDEPENDENT_AMBULATORY_CARE_PROVIDER_SITE_OTHER): Payer: Medicare Other | Admitting: Interventional Cardiology

## 2017-04-14 ENCOUNTER — Encounter: Payer: Self-pay | Admitting: Interventional Cardiology

## 2017-04-14 VITALS — BP 126/72 | HR 70 | Ht 65.0 in | Wt 146.8 lb

## 2017-04-14 DIAGNOSIS — R0602 Shortness of breath: Secondary | ICD-10-CM

## 2017-04-14 DIAGNOSIS — I10 Essential (primary) hypertension: Secondary | ICD-10-CM

## 2017-04-14 DIAGNOSIS — R7989 Other specified abnormal findings of blood chemistry: Secondary | ICD-10-CM

## 2017-04-14 DIAGNOSIS — R778 Other specified abnormalities of plasma proteins: Secondary | ICD-10-CM

## 2017-04-14 DIAGNOSIS — R748 Abnormal levels of other serum enzymes: Secondary | ICD-10-CM

## 2017-04-14 NOTE — Patient Instructions (Signed)
Medication Instructions:  Your physician recommends that you continue on your current medications as directed. Please refer to the Current Medication list given to you today.   Labwork: None ordered.  Testing/Procedures: Your physician has requested that you have an echocardiogram. Echocardiography is a painless test that uses sound waves to create images of your heart. It provides your doctor with information about the size and shape of your heart and how well your heart's chambers and valves are working. This procedure takes approximately one hour. There are no restrictions for this procedure.    Follow-Up: Follow up will be as needed based on your results.   Any Other Special Instructions Will Be Listed Below (If Applicable).   Echocardiogram An echocardiogram, or echocardiography, uses sound waves (ultrasound) to produce an image of your heart. The echocardiogram is simple, painless, obtained within a short period of time, and offers valuable information to your health care provider. The images from an echocardiogram can provide information such as:  Evidence of coronary artery disease (CAD).  Heart size.  Heart muscle function.  Heart valve function.  Aneurysm detection.  Evidence of a past heart attack.  Fluid buildup around the heart.  Heart muscle thickening.  Assess heart valve function. Tell a health care provider about:  Any allergies you have.  All medicines you are taking, including vitamins, herbs, eye drops, creams, and over-the-counter medicines.  Any problems you or family members have had with anesthetic medicines.  Any blood disorders you have.  Any surgeries you have had.  Any medical conditions you have.  Whether you are pregnant or may be pregnant. What happens before the procedure? No special preparation is needed. Eat and drink normally. What happens during the procedure?  In order to produce an image of your heart, gel will be applied  to your chest and a wand-like tool (transducer) will be moved over your chest. The gel will help transmit the sound waves from the transducer. The sound waves will harmlessly bounce off your heart to allow the heart images to be captured in real-time motion. These images will then be recorded.  You may need an IV to receive a medicine that improves the quality of the pictures. What happens after the procedure? You may return to your normal schedule including diet, activities, and medicines, unless your health care provider tells you otherwise. This information is not intended to replace advice given to you by your health care provider. Make sure you discuss any questions you have with your health care provider. Document Released: 12/05/2000 Document Revised: 07/26/2016 Document Reviewed: 08/15/2013 Elsevier Interactive Patient Education  2017 [employer].  If you need a refill on your cardiac medications before your next appointment, please call your pharmacy.

## 2017-04-28 ENCOUNTER — Ambulatory Visit (HOSPITAL_COMMUNITY): Payer: Medicare Other | Attending: Cardiovascular Disease

## 2017-04-28 ENCOUNTER — Other Ambulatory Visit: Payer: Self-pay

## 2017-04-28 DIAGNOSIS — E785 Hyperlipidemia, unspecified: Secondary | ICD-10-CM | POA: Insufficient documentation

## 2017-04-28 DIAGNOSIS — I340 Nonrheumatic mitral (valve) insufficiency: Secondary | ICD-10-CM | POA: Insufficient documentation

## 2017-04-28 DIAGNOSIS — R0602 Shortness of breath: Secondary | ICD-10-CM | POA: Diagnosis not present

## 2017-04-28 DIAGNOSIS — I119 Hypertensive heart disease without heart failure: Secondary | ICD-10-CM | POA: Insufficient documentation

## 2017-06-12 ENCOUNTER — Ambulatory Visit: Payer: Medicare Other | Admitting: Oncology

## 2017-06-17 DIAGNOSIS — E876 Hypokalemia: Secondary | ICD-10-CM | POA: Diagnosis not present

## 2017-06-17 DIAGNOSIS — I10 Essential (primary) hypertension: Secondary | ICD-10-CM | POA: Diagnosis not present

## 2017-06-19 DIAGNOSIS — D481 Neoplasm of uncertain behavior of connective and other soft tissue: Secondary | ICD-10-CM | POA: Diagnosis not present

## 2017-06-19 DIAGNOSIS — D485 Neoplasm of uncertain behavior of skin: Secondary | ICD-10-CM | POA: Diagnosis not present

## 2017-06-19 DIAGNOSIS — L570 Actinic keratosis: Secondary | ICD-10-CM | POA: Diagnosis not present

## 2017-06-19 DIAGNOSIS — Z85828 Personal history of other malignant neoplasm of skin: Secondary | ICD-10-CM | POA: Diagnosis not present

## 2017-06-19 DIAGNOSIS — L578 Other skin changes due to chronic exposure to nonionizing radiation: Secondary | ICD-10-CM | POA: Diagnosis not present

## 2017-07-06 ENCOUNTER — Emergency Department (HOSPITAL_COMMUNITY): Payer: Medicare Other

## 2017-07-06 ENCOUNTER — Encounter (HOSPITAL_COMMUNITY): Payer: Self-pay | Admitting: Radiology

## 2017-07-06 ENCOUNTER — Emergency Department (HOSPITAL_COMMUNITY)
Admission: EM | Admit: 2017-07-06 | Discharge: 2017-07-06 | Disposition: A | Discharge status: Home / Self Care | Payer: Medicare Other | Attending: Emergency Medicine | Admitting: Emergency Medicine

## 2017-07-06 DIAGNOSIS — I10 Essential (primary) hypertension: Secondary | ICD-10-CM | POA: Insufficient documentation

## 2017-07-06 DIAGNOSIS — K5730 Diverticulosis of large intestine without perforation or abscess without bleeding: Secondary | ICD-10-CM | POA: Diagnosis not present

## 2017-07-06 DIAGNOSIS — R112 Nausea with vomiting, unspecified: Secondary | ICD-10-CM | POA: Insufficient documentation

## 2017-07-06 DIAGNOSIS — R1084 Generalized abdominal pain: Secondary | ICD-10-CM | POA: Diagnosis not present

## 2017-07-06 DIAGNOSIS — R531 Weakness: Secondary | ICD-10-CM | POA: Diagnosis not present

## 2017-07-06 DIAGNOSIS — E039 Hypothyroidism, unspecified: Secondary | ICD-10-CM | POA: Diagnosis not present

## 2017-07-06 DIAGNOSIS — Z79899 Other long term (current) drug therapy: Secondary | ICD-10-CM | POA: Insufficient documentation

## 2017-07-06 DIAGNOSIS — Z85828 Personal history of other malignant neoplasm of skin: Secondary | ICD-10-CM | POA: Insufficient documentation

## 2017-07-06 DIAGNOSIS — R404 Transient alteration of awareness: Secondary | ICD-10-CM | POA: Diagnosis not present

## 2017-07-06 DIAGNOSIS — R109 Unspecified abdominal pain: Secondary | ICD-10-CM | POA: Insufficient documentation

## 2017-07-06 LAB — CBC WITH DIFFERENTIAL/PLATELET
BASOS PCT: 0 %
Basophils Absolute: 0 10*3/uL (ref 0.0–0.1)
EOS ABS: 0 10*3/uL (ref 0.0–0.7)
Eosinophils Relative: 1 %
HEMATOCRIT: 35.2 % — AB (ref 36.0–46.0)
Hemoglobin: 12.4 g/dL (ref 12.0–15.0)
Lymphocytes Relative: 53 %
Lymphs Abs: 2.9 10*3/uL (ref 0.7–4.0)
MCH: 32 pg (ref 26.0–34.0)
MCHC: 35.2 g/dL (ref 30.0–36.0)
MCV: 90.7 fL (ref 78.0–100.0)
MONO ABS: 0.4 10*3/uL (ref 0.1–1.0)
MONOS PCT: 8 %
NEUTROS ABS: 2.1 10*3/uL (ref 1.7–7.7)
Neutrophils Relative %: 38 %
Platelets: 235 10*3/uL (ref 150–400)
RBC: 3.88 MIL/uL (ref 3.87–5.11)
RDW: 12.4 % (ref 11.5–15.5)
WBC: 5.6 10*3/uL (ref 4.0–10.5)

## 2017-07-06 LAB — URINALYSIS, ROUTINE W REFLEX MICROSCOPIC
BILIRUBIN URINE: NEGATIVE
Bacteria, UA: NONE SEEN
GLUCOSE, UA: NEGATIVE mg/dL
KETONES UR: NEGATIVE mg/dL
LEUKOCYTES UA: NEGATIVE
NITRITE: NEGATIVE
PH: 7 (ref 5.0–8.0)
Protein, ur: NEGATIVE mg/dL
RBC / HPF: NONE SEEN RBC/hpf (ref 0–5)
Specific Gravity, Urine: 1.003 — ABNORMAL LOW (ref 1.005–1.030)
Squamous Epithelial / LPF: NONE SEEN

## 2017-07-06 LAB — COMPREHENSIVE METABOLIC PANEL
ALK PHOS: 51 U/L (ref 38–126)
ALT: 17 U/L (ref 14–54)
ANION GAP: 8 (ref 5–15)
AST: 26 U/L (ref 15–41)
Albumin: 4 g/dL (ref 3.5–5.0)
BILIRUBIN TOTAL: 0.5 mg/dL (ref 0.3–1.2)
BUN: 17 mg/dL (ref 6–20)
CALCIUM: 9.4 mg/dL (ref 8.9–10.3)
CO2: 24 mmol/L (ref 22–32)
Chloride: 105 mmol/L (ref 101–111)
Creatinine, Ser: 1 mg/dL (ref 0.44–1.00)
GFR calc Af Amer: 58 mL/min — ABNORMAL LOW (ref >60)
GFR calc non Af Amer: 50 mL/min — ABNORMAL LOW (ref >60)
Glucose, Bld: 101 mg/dL — ABNORMAL HIGH (ref 65–99)
Potassium: 3.7 mmol/L (ref 3.5–5.1)
SODIUM: 137 mmol/L (ref 135–145)
TOTAL PROTEIN: 7.4 g/dL (ref 6.5–8.1)

## 2017-07-06 LAB — LIPASE, BLOOD: Lipase: 30 U/L (ref 11–51)

## 2017-07-06 LAB — TROPONIN I

## 2017-07-06 MED ORDER — ONDANSETRON 4 MG PO TBDP: 4.0000 mg | ORAL_TABLET | Freq: Three times a day (TID) | ORAL | 0 refills | Status: DC | PRN

## 2017-07-06 MED ORDER — HYDRALAZINE HCL 25 MG PO TABS
25.0000 mg | ORAL_TABLET | Freq: Once | ORAL | Status: AC
  Administered 2017-07-06: 25 mg via ORAL
  Filled 2017-07-06: qty 1

## 2017-07-06 MED ORDER — PROMETHAZINE HCL 12.5 MG PO TABS: 12.5000 mg | ORAL_TABLET | Freq: Four times a day (QID) | ORAL | 0 refills | Status: DC | PRN

## 2017-07-06 MED ORDER — IOPAMIDOL (ISOVUE-300) INJECTION 61%
100.0000 mL | Freq: Once | INTRAVENOUS | Status: AC | PRN
  Administered 2017-07-06: 100 mL via INTRAVENOUS

## 2017-07-06 MED ORDER — ONDANSETRON 4 MG PO TBDP: 4.0000 mg | ORAL_TABLET | Freq: Once | ORAL | Status: DC | PRN

## 2017-07-06 MED ORDER — FENTANYL CITRATE (PF) 100 MCG/2ML IJ SOLN
50.0000 ug | Freq: Once | INTRAMUSCULAR | Status: AC
  Administered 2017-07-06: 50 ug via INTRAVENOUS
  Filled 2017-07-06: qty 2

## 2017-07-06 MED ORDER — HYDRALAZINE HCL 25 MG PO TABS: 25.0000 mg | ORAL_TABLET | Freq: Three times a day (TID) | ORAL | 0 refills | Status: DC

## 2017-07-06 MED ORDER — IOPAMIDOL (ISOVUE-300) INJECTION 61%
INTRAVENOUS | Status: AC
  Filled 2017-07-06: qty 100

## 2017-07-06 MED ORDER — ONDANSETRON HCL 4 MG/2ML IJ SOLN
4.0000 mg | Freq: Once | INTRAMUSCULAR | Status: AC
  Administered 2017-07-06: 4 mg via INTRAVENOUS
  Filled 2017-07-06: qty 2

## 2017-07-06 NOTE — ED Notes (Signed)
Patient transported to CT 

## 2017-07-06 NOTE — ED Provider Notes (Signed)
[facility] DEPT Provider Note   CSN: [id number] Arrival date & time: 07/06/17  1442     History   Chief Complaint Chief Complaint  Patient presents with  . Nausea    HPI Cindy [name] is a 83 y.o. female.  HPI  83 year old female with a history of prior colon and anal cancer, current hypertension and hypothyroidism presents with nausea and vomiting. She was admitted in March 2018 and states this feels similar. At that time she thinks she was diagnosed with a gastroenteritis. She's been having nausea for about one week. Over the last couple days has developed lower abdominal pain, worst in the left lower quadrant, and now has been vomiting since yesterday. She states the pain seems to come and go and is sharp in nature. When it comes it is a 10/10. She denies any fevers, headaches, neck pain, chest pain, shortness of breath, cough, back pain, or urinary symptoms. She has not had a bowel movement in 2 days but states that otherwise she has not been constipated. There is no diarrhea.  Past Medical History:  Diagnosis Date  . Arthritis   . Cancer ([place])    skin  . Colon cancer ([place]) 08/31/13   invasive squamous cell  . Deaf, left   . Depression   . GERD (gastroesophageal reflux disease)   . History of radiation therapy 10/10/13-11/23/13   anal canal 54GY/  . Hyperlipemia   . Hypertension   . Hypothyroidism   . PONV (postoperative nausea and vomiting)   . Skin cancer    Shingles 2012 Bad case    Patient Active Problem List   Diagnosis Date Noted  . Sepsis ([place]) 03/09/2017  . Nausea vomiting and diarrhea 03/09/2017  . Elevated troponin 03/09/2017  . Chest pain 12/25/2013  . Dyspnea 12/25/2013  . Restless leg syndrome 12/25/2013  . Mucositis 10/27/2013  . Hypophosphatemia 10/27/2013  . Protein-calorie malnutrition, severe ([place]) 10/25/2013  . Thrombocytopenia ([place]) 10/25/2013  . Hypomagnesemia 10/24/2013  . Febrile neutropenia ([place]) 10/23/2013  . Anal cancer ([place])  09/09/2013  . Colon cancer ([place]) 08/31/2013  . Colitis, acute 03/17/2013  . Gastroparesis 10/22/2012  . Hand pain, left 10/22/2012  . Anxiety 10/18/2012  . Hyperventilation syndrome 10/18/2012  . Headache(784.0) 10/18/2012  . Neck pain on right side 10/18/2012  . Hypertension 10/18/2012  . GERD (gastroesophageal reflux disease) 10/18/2012  . Hyperlipidemia 10/18/2012  . Insomnia 10/18/2012  . Hearing loss sensory, bilateral 10/18/2012  . 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 Weakness 10/17/2012  . Hyponatremia 10/17/2012  . Hypokalemia 10/17/2012  . Hypothyroidism 10/17/2012  . Fatigue 10/17/2012    Past Surgical History:  Procedure Laterality Date  . ABDOMINAL HYSTERECTOMY    . APPENDECTOMY    . CESAREAN SECTION     2  . CHOLECYSTECTOMY    . COLONOSCOPY  08/31/13   invasive squamous cell colon  . ERCP    . ESOPHAGOGASTRODUODENOSCOPY  10/20/2012   Procedure: ESOPHAGOGASTRODUODENOSCOPY (EGD);  Surgeon: Arta Silence, MD;  Location: [facility];  Service: Endoscopy;  Laterality: Left;  . EYE SURGERY     cataracts  . KNEE ARTHROSCOPY  05/11/2012   Procedure: ARTHROSCOPY KNEE;  Surgeon: Hessie Dibble, MD;  Location: [facility];  Service: Orthopedics;  Laterality: Right;  left knee medial menisectomy and chondroplasty  . THYROIDECTOMY, PARTIAL    . TONSILLECTOMY      OB History    No data available       Home Medications  Prior to Admission medications   Medication Sig Start Date End Date Taking? Authorizing Provider  amLODipine-valsartan (EXFORGE) 5-160 MG tablet TK 1 T PO QD 06/18/17  Yes [provider]  FLUoxetine (PROZAC) 40 MG capsule Take 40 mg by mouth every morning.    Yes [provider]  KLOR-CON M20 20 MEQ tablet Take 20 mEq by mouth 2 (two) times daily.  02/11/17  Yes [provider]  levothyroxine (SYNTHROID, LEVOTHROID) 88 MCG tablet Take 88 mcg by mouth daily before breakfast.   Yes [provider]  Multiple Vitamin (MULTIVITAMIN WITH MINERALS) TABS tablet Take 1 tablet by mouth daily. 10/27/13  Yes Rama, Venetia Maxon, MD  Nutritional Supplements (JUICE PLUS FIBRE PO) Take 1 tablet by mouth daily.   Yes [provider]  Omega-3 Fatty Acids (FISH OIL) 1000 MG CAPS Take 1 capsule by mouth daily.   Yes [provider]  PREMARIN 0.3 MG tablet Take 0.3 mg by mouth daily. 08/14/16  Yes [provider]  aspirin-acetaminophen-caffeine (EXCEDRIN MIGRAINE) [id number] MG tablet Take 1 tablet by mouth every 6 (six) hours as needed for headache (back pain).    [provider]  ciprofloxacin (CIPRO) 500 MG tablet Take 1 tablet (500 mg total) by mouth 2 (two) times daily. Patient not taking: Reported on 07/06/2017 03/13/17   Annita Brod, MD  hydrALAZINE (APRESOLINE) 25 MG tablet Take 1 tablet (25 mg total) by mouth every 8 (eight) hours. 07/06/17   Sherwood Gambler, MD  metroNIDAZOLE (FLAGYL) 500 MG tablet Take 1 tablet (500 mg total) by mouth 3 (three) times daily. Patient not taking: Reported on 07/06/2017 03/13/17   Annita Brod, MD  ondansetron (ZOFRAN ODT) 4 MG disintegrating tablet Take 1 tablet (4 mg total) by mouth every 8 (eight) hours as needed for nausea or vomiting. 07/06/17   Sherwood Gambler, MD  promethazine (PHENERGAN) 12.5 MG tablet Take 1 tablet (12.5 mg total) by mouth every 6 (six) hours as needed for nausea or vomiting. 07/06/17   Sherwood Gambler, MD    Family History Family History  Problem Relation Age of Onset  . Kidney disease Mother   . Heart attack Father   . Heart disease Father   . Cancer Sister        breast  . Cancer Sister        ovarian  . Cancer Sister        melanoma  . Cancer Daughter        breast    Social History Social History  Substance Use Topics  . Smoking status: Never Smoker  . Smokeless tobacco: Never Used  . Alcohol use No     Allergies   Vancomycin; Zosyn [piperacillin sod-tazobactam  so]; Hydrocodone; and Morphine and related   Review of Systems Review of Systems  Constitutional: Negative for fever.  Respiratory: Negative for cough and shortness of breath.   Cardiovascular: Negative for chest pain.  Gastrointestinal: Positive for abdominal pain, nausea and vomiting. Negative for diarrhea.  Genitourinary: Negative for dysuria and hematuria.  Musculoskeletal: Negative for back pain and neck pain.  Neurological: Negative for headaches.  All other systems reviewed and are negative.    Physical Exam Updated Vital Signs BP (!) 218/88 (BP Location: Left Arm)   Pulse (!) 56   Temp 98.3 F (36.8 C) (Oral)   Resp 18   Ht 5\' 6"  (1.676 m)   Wt 64 kg (141 lb)   SpO2 99%   BMI 22.76 kg/m  Physical Exam  Constitutional: She is oriented to person, place, and time. She appears well-developed and well-nourished.  HENT:  Head: Normocephalic and atraumatic.  Right Ear: External ear normal.  Left Ear: External ear normal.  Nose: Nose normal.  Eyes: Right eye exhibits no discharge. Left eye exhibits no discharge.  Cardiovascular: Normal rate, regular rhythm and normal heart sounds.   Pulmonary/Chest: Effort normal and breath sounds normal.  Abdominal: Soft. There is generalized tenderness. There is no rigidity and no CVA tenderness.    Neurological: She is alert and oriented to person, place, and time.  Skin: Skin is warm and dry. She is not diaphoretic.  Nursing note and vitals reviewed.    ED Treatments / Results  Labs (all labs ordered are listed, but only abnormal results are displayed) Labs Reviewed  COMPREHENSIVE METABOLIC PANEL - Abnormal; Notable for the following:       Result Value   Glucose, Bld 101 (*)    GFR calc non Af Amer 50 (*)    GFR calc Af Amer 58 (*)    All other components within normal limits  URINALYSIS, ROUTINE W REFLEX MICROSCOPIC - Abnormal; Notable for the following:    Color, Urine COLORLESS (*)    Specific Gravity, Urine 1.003  (*)    Hgb urine dipstick SMALL (*)    All other components within normal limits  CBC WITH DIFFERENTIAL/PLATELET - Abnormal; Notable for the following:    HCT 35.2 (*)    All other components within normal limits  LIPASE, BLOOD  TROPONIN I    EKG  EKG Interpretation  Date/Time:  Monday July 06 2017 15:12:15 EDT Ventricular Rate:  50 PR Interval:    QRS Duration: 92 QT Interval:  462 QTC Calculation: 422 R Axis:   32 Text Interpretation:  Sinus rhythm Atrial premature complex Low voltage, precordial leads rate is slower, otherwise similar to Mar 2018 Confirmed by Sherwood Gambler [phone]) on 07/06/2017 3:17:00 PM Also confirmed by Sherwood Gambler [phone]), editor Drema Pry [phone])  on 07/06/2017 3:56:02 PM       Radiology Ct Abdomen Pelvis W Contrast  Result Date: 07/06/2017 CLINICAL DATA:  Left lower abdominal pain, nausea and vomiting for several days. Prior hysterectomy, cholecystectomy and appendectomy. Electronic medical record history of colon cancer. EXAM: CT ABDOMEN AND PELVIS WITH CONTRAST TECHNIQUE: Multidetector CT imaging of the abdomen and pelvis was performed using the standard protocol following bolus administration of intravenous contrast. CONTRAST:  161mL ISOVUE-300 IOPAMIDOL (ISOVUE-300) INJECTION 61% COMPARISON:  03/09/2017 CT abdomen/ pelvis. FINDINGS: Lower chest: No significant pulmonary nodules or acute consolidative airspace disease. Coronary atherosclerosis. Hepatobiliary: Normal liver size. No liver mass. Cholecystectomy. Bile ducts are stable and within expected post cholecystectomy limits with common bile duct diameter 8 mm. Pancreas: Normal, with no mass or duct dilation. Spleen: Normal size. No mass. Adrenals/Urinary Tract: Normal adrenals. Stable fullness of the central renal collecting systems bilaterally, right greater than left, without overt hydronephrosis. Subcentimeter hypodense renal cortical lesions in both kidneys are too small to characterize  and not appreciably changed, for which no further follow-up is required. No new renal lesions. Normal bladder. Stomach/Bowel: Grossly normal stomach. Normal caliber small bowel with no small bowel wall thickening. Appendectomy. Mild diverticulosis of the descending and sigmoid colon. No convincing large bowel wall thickening or acute pericolonic fat stranding. Vascular/Lymphatic: Atherosclerotic nonaneurysmal abdominal aorta. Patent portal, splenic, hepatic and renal veins. No pathologically enlarged lymph nodes in the abdomen or pelvis. Reproductive: Status post hysterectomy, with no abnormal  findings at the vaginal cuff. No adnexal mass. Other: No pneumoperitoneum, ascites or focal fluid collection. Subcutaneous nodularity in the ventral lower pelvic wall with dominant 1.4 cm right-sided subcutaneous nodule (series 2/ image 76), not appreciably changed back to 03/17/2013, considered benign. Musculoskeletal: No aggressive appearing focal osseous lesions. Marked thoracolumbar spondylosis. IMPRESSION: 1. Mild left colonic diverticulosis, with no convincing findings of acute diverticulitis. No evidence of bowel obstruction or acute bowel inflammation . 2. Stable chronic mild fullness of the central renal collecting systems, without overt hydronephrosis. 3. Stable nonspecific chronic nodularity in the subcutaneous ventral low pelvic wall. 4. Aortic Atherosclerosis (ICD10-I70.0). Coronary atherosclerosis . Electronically Signed   By: Ilona Sorrel M.D.   On: 07/06/2017 17:42    Procedures Procedures (including critical care time)  Medications Ordered in ED Medications  iopamidol (ISOVUE-300) 61 % injection (  Hold 07/06/17 1749)  ondansetron (ZOFRAN) injection 4 mg (4 mg Intravenous Given 07/06/17 1544)  fentaNYL (SUBLIMAZE) injection 50 mcg (50 mcg Intravenous Given 07/06/17 1544)  iopamidol (ISOVUE-300) 61 % injection 100 mL (100 mLs Intravenous Contrast Given 07/06/17 1710)     Initial Impression /  Assessment and Plan / ED Course  I have reviewed the triage vital signs and the nursing notes.  Pertinent labs & imaging results that were available during my care of the patient were reviewed by me and considered in my medical decision making (see chart for details).     No clear etiology for the patient's vomiting. Currently she has stopped vomiting and feels back to normal and is able to drink oral fluids. Workup was unremarkable including a CT scan with no acute pathology. She is afebrile and has a normal white blood cell count and benign electrolytes. No chest/anginal symptoms. Given that she is now tolerating oral fluids think she is stable for discharge home. Given no acute bacterial etiologies seen, I think it is reasonable to treat symptomatically. Family is requesting Phenergan in addition to Zofran because this has worked for her in the past. I discussed potential side effects of these medicines. Otherwise there also asking for her to be put back on hydralazine. She was put on this after the last admission and then changed by her PCP, however her other medicines have had side effects that she had to stop these. I will write her the hydralazine but discussed she needs to follow up with her PCP for outpatient low pressure management. Discussed return precautions.  Final Clinical Impressions(s) / ED Diagnoses   Final diagnoses:  Nausea and vomiting in adult  Abdominal pain, unspecified abdominal location    New Prescriptions New Prescriptions   ONDANSETRON (ZOFRAN ODT) 4 MG DISINTEGRATING TABLET    Take 1 tablet (4 mg total) by mouth every 8 (eight) hours as needed for nausea or vomiting.   PROMETHAZINE (PHENERGAN) 12.5 MG TABLET    Take 1 tablet (12.5 mg total) by mouth every 6 (six) hours as needed for nausea or vomiting.     Sherwood Gambler, MD 07/06/17 1901

## 2017-07-06 NOTE — ED Triage Notes (Signed)
Pt presents from home via [facility] after having nausea and vomiting x 6 days. Alert and oriented. Taking zofran at home w/o relief.

## 2017-07-06 NOTE — ED Notes (Signed)
ED Provider at bedside. 

## 2017-08-04 DIAGNOSIS — H04123 Dry eye syndrome of bilateral lacrimal glands: Secondary | ICD-10-CM | POA: Diagnosis not present

## 2017-08-04 DIAGNOSIS — H04203 Unspecified epiphora, bilateral lacrimal glands: Secondary | ICD-10-CM | POA: Diagnosis not present

## 2017-08-04 DIAGNOSIS — H01001 Unspecified blepharitis right upper eyelid: Secondary | ICD-10-CM | POA: Diagnosis not present

## 2017-08-04 DIAGNOSIS — H1852 Epithelial (juvenile) corneal dystrophy: Secondary | ICD-10-CM | POA: Diagnosis not present

## 2017-09-09 DIAGNOSIS — E876 Hypokalemia: Secondary | ICD-10-CM | POA: Diagnosis not present

## 2017-09-09 DIAGNOSIS — I10 Essential (primary) hypertension: Secondary | ICD-10-CM | POA: Diagnosis not present

## 2017-09-09 DIAGNOSIS — Z23 Encounter for immunization: Secondary | ICD-10-CM | POA: Diagnosis not present

## 2017-09-09 DIAGNOSIS — I700 Atherosclerosis of aorta: Secondary | ICD-10-CM | POA: Diagnosis not present

## 2017-09-09 DIAGNOSIS — K219 Gastro-esophageal reflux disease without esophagitis: Secondary | ICD-10-CM | POA: Diagnosis not present

## 2017-09-09 DIAGNOSIS — R195 Other fecal abnormalities: Secondary | ICD-10-CM | POA: Diagnosis not present

## 2017-10-06 DIAGNOSIS — L988 Other specified disorders of the skin and subcutaneous tissue: Secondary | ICD-10-CM | POA: Diagnosis not present

## 2017-10-06 DIAGNOSIS — C44529 Squamous cell carcinoma of skin of other part of trunk: Secondary | ICD-10-CM | POA: Diagnosis not present

## 2017-10-06 DIAGNOSIS — Z85828 Personal history of other malignant neoplasm of skin: Secondary | ICD-10-CM | POA: Diagnosis not present

## 2017-11-10 DIAGNOSIS — Z1231 Encounter for screening mammogram for malignant neoplasm of breast: Secondary | ICD-10-CM | POA: Diagnosis not present

## 2017-11-10 DIAGNOSIS — M85852 Other specified disorders of bone density and structure, left thigh: Secondary | ICD-10-CM | POA: Diagnosis not present

## 2017-11-10 DIAGNOSIS — Z803 Family history of malignant neoplasm of breast: Secondary | ICD-10-CM | POA: Diagnosis not present

## 2018-03-22 DIAGNOSIS — R112 Nausea with vomiting, unspecified: Secondary | ICD-10-CM | POA: Diagnosis not present

## 2018-03-22 DIAGNOSIS — E538 Deficiency of other specified B group vitamins: Secondary | ICD-10-CM | POA: Diagnosis not present

## 2018-03-22 DIAGNOSIS — E039 Hypothyroidism, unspecified: Secondary | ICD-10-CM | POA: Diagnosis not present

## 2018-03-22 DIAGNOSIS — R159 Full incontinence of feces: Secondary | ICD-10-CM | POA: Diagnosis not present

## 2018-03-22 DIAGNOSIS — F332 Major depressive disorder, recurrent severe without psychotic features: Secondary | ICD-10-CM | POA: Diagnosis not present

## 2018-03-22 DIAGNOSIS — I10 Essential (primary) hypertension: Secondary | ICD-10-CM | POA: Diagnosis not present

## 2018-03-22 DIAGNOSIS — K219 Gastro-esophageal reflux disease without esophagitis: Secondary | ICD-10-CM | POA: Diagnosis not present

## 2018-03-22 DIAGNOSIS — R197 Diarrhea, unspecified: Secondary | ICD-10-CM | POA: Diagnosis not present

## 2018-03-25 ENCOUNTER — Other Ambulatory Visit: Payer: Self-pay | Admitting: Internal Medicine

## 2018-03-25 DIAGNOSIS — K219 Gastro-esophageal reflux disease without esophagitis: Secondary | ICD-10-CM

## 2018-03-30 ENCOUNTER — Other Ambulatory Visit: Payer: Medicare Other

## 2018-04-13 DIAGNOSIS — F332 Major depressive disorder, recurrent severe without psychotic features: Secondary | ICD-10-CM | POA: Diagnosis not present

## 2018-04-13 DIAGNOSIS — Z0000 Encounter for general adult medical examination without abnormal findings: Secondary | ICD-10-CM | POA: Diagnosis not present

## 2018-04-13 DIAGNOSIS — I10 Essential (primary) hypertension: Secondary | ICD-10-CM | POA: Diagnosis not present

## 2018-04-13 DIAGNOSIS — E039 Hypothyroidism, unspecified: Secondary | ICD-10-CM | POA: Diagnosis not present

## 2018-04-13 DIAGNOSIS — R159 Full incontinence of feces: Secondary | ICD-10-CM | POA: Diagnosis not present

## 2018-04-13 DIAGNOSIS — E538 Deficiency of other specified B group vitamins: Secondary | ICD-10-CM | POA: Diagnosis not present

## 2018-04-13 DIAGNOSIS — R197 Diarrhea, unspecified: Secondary | ICD-10-CM | POA: Diagnosis not present

## 2018-04-13 DIAGNOSIS — R112 Nausea with vomiting, unspecified: Secondary | ICD-10-CM | POA: Diagnosis not present

## 2018-04-13 DIAGNOSIS — K219 Gastro-esophageal reflux disease without esophagitis: Secondary | ICD-10-CM | POA: Diagnosis not present

## 2018-04-15 ENCOUNTER — Ambulatory Visit
Admission: RE | Admit: 2018-04-15 | Discharge: 2018-04-15 | Disposition: A | Discharge status: Home / Self Care | Payer: Medicare Other | Source: Ambulatory Visit | Attending: Internal Medicine | Admitting: Internal Medicine

## 2018-04-15 DIAGNOSIS — K219 Gastro-esophageal reflux disease without esophagitis: Secondary | ICD-10-CM

## 2018-07-13 DIAGNOSIS — R197 Diarrhea, unspecified: Secondary | ICD-10-CM | POA: Diagnosis not present

## 2018-07-13 DIAGNOSIS — F332 Major depressive disorder, recurrent severe without psychotic features: Secondary | ICD-10-CM | POA: Diagnosis not present

## 2018-07-13 DIAGNOSIS — E039 Hypothyroidism, unspecified: Secondary | ICD-10-CM | POA: Diagnosis not present

## 2018-07-13 DIAGNOSIS — R112 Nausea with vomiting, unspecified: Secondary | ICD-10-CM | POA: Diagnosis not present

## 2018-07-13 DIAGNOSIS — I10 Essential (primary) hypertension: Secondary | ICD-10-CM | POA: Diagnosis not present

## 2018-07-13 DIAGNOSIS — R159 Full incontinence of feces: Secondary | ICD-10-CM | POA: Diagnosis not present

## 2018-07-13 DIAGNOSIS — K219 Gastro-esophageal reflux disease without esophagitis: Secondary | ICD-10-CM | POA: Diagnosis not present

## 2018-07-13 DIAGNOSIS — E538 Deficiency of other specified B group vitamins: Secondary | ICD-10-CM | POA: Diagnosis not present

## 2018-12-23 DIAGNOSIS — Z23 Encounter for immunization: Secondary | ICD-10-CM | POA: Diagnosis not present

## 2019-01-04 DIAGNOSIS — Z803 Family history of malignant neoplasm of breast: Secondary | ICD-10-CM | POA: Diagnosis not present

## 2019-01-04 DIAGNOSIS — Z1231 Encounter for screening mammogram for malignant neoplasm of breast: Secondary | ICD-10-CM | POA: Diagnosis not present

## 2019-08-31 DIAGNOSIS — E538 Deficiency of other specified B group vitamins: Secondary | ICD-10-CM | POA: Diagnosis not present

## 2019-08-31 DIAGNOSIS — E039 Hypothyroidism, unspecified: Secondary | ICD-10-CM | POA: Diagnosis not present

## 2019-08-31 DIAGNOSIS — Z23 Encounter for immunization: Secondary | ICD-10-CM | POA: Diagnosis not present

## 2019-08-31 DIAGNOSIS — E559 Vitamin D deficiency, unspecified: Secondary | ICD-10-CM | POA: Diagnosis not present

## 2019-08-31 DIAGNOSIS — K219 Gastro-esophageal reflux disease without esophagitis: Secondary | ICD-10-CM | POA: Diagnosis not present

## 2019-08-31 DIAGNOSIS — R413 Other amnesia: Secondary | ICD-10-CM | POA: Diagnosis not present

## 2019-08-31 DIAGNOSIS — I10 Essential (primary) hypertension: Secondary | ICD-10-CM | POA: Diagnosis not present

## 2019-09-12 ENCOUNTER — Other Ambulatory Visit: Payer: Self-pay

## 2019-09-12 NOTE — Patient Outreach (Signed)
[facility]) Care Management  09/12/2019  Cindy [name] [birth year] [id number]   Telephone Screen  Referral Date: 09/12/2019 Referral Source: Remote Health Referral Reason: " dementia" Insurance: Medicare   Outreach attempt # 1 to patient/daughter. Number listed on referral for daughter not in service. RN CM attempted main number for patient. Spoke with patient who was alert and oriented. She voiced that her daughter handles her medical affairs and requested that RN CM call daughter at [phone]. RN CM spoke with daughter. She reports that PCP referred patient to remote health program which is free of charge to patient. She voices that with this service she gets a NP that comes out about every two weeks to assess patient and provide hands on care to patient including IV infusions and whatever service patient may need to avoid going inpatient and being placed in facility. Daughter voices that she is looking for in home support to assist with patient's care. Patient lives alone and does not want to go to a facility. She has three children that live nearby and assist her as needed. However, they all work and are not able to provide 24hr care to patient.She reports that she needs someone to assist with making sure patient eats, takes meds and gets dressed safely. She would like someone in the home for a few hours per day to help pt. Daughter is aware that these services will be an out of pocket expense as she does not think patient qualifies for Medicaid. daughter would like to speak with a SW regarding this. Daughter also states that patient is taking about six meds. She fills med planner daily for patient and calls to remind her to take meds as well as has posted signs all around the home as reminders. However, she voices that patient still forgets to take meds often and would like resources/tips on how to get pt to take meds. [facility] services reviewed and discussed with daughter and  verbal consent for services given.     Plan: RN CM will send [facility] SW referral for possible assistance with in home resources/support. RN CM will send [facility] pharmacy referral for possible med adherence/med mgmt assistance.   Enzo Montgomery, RN,BSN,CCM [facility] Management Telephonic Care Management Coordinator Direct Phone: [phone] Toll Free: [phone] Fax: [phone]

## 2019-09-13 ENCOUNTER — Other Ambulatory Visit: Payer: Self-pay

## 2019-09-13 NOTE — Patient Outreach (Signed)
[facility]) Care Management  09/13/2019  AALIYA SCHENONE 12-11-1930 [id number]   Successful outreach to patient's daughter regarding social work referral for in-home aide/personal care services. Daughter is aware that in-home aide services are not covered by Medicare; patient is over the income limit to qualify for Medicaid.  Talked with daughter about general cost information and minimum hour requirement for private pay aide services.  Informed her that this varies from agency to agency.  Daughter requested that list of providers be emailed to her.  Emailed Home Care and Hospice Directory for [facility].  Closing social work case but did encourage daughter to contact me if additional questions/needs arise.  Ronn Melena, BSW Social Worker [phone]

## 2019-09-14 ENCOUNTER — Other Ambulatory Visit: Payer: Self-pay | Admitting: Pharmacist

## 2019-09-14 NOTE — Patient Outreach (Signed)
[facility]) Care Management [place]  09/14/2019  AMANPREET BONURA February 10, 1930 [id number]  Reason for referral: medication management related to dementia, patient forgetting to take medications  Successful call to patient's daughter.  Daughter confirms patient is still seeing Dr. Josetta Huddle.  Dr. Inda Merlin has pharmacist embedded in clinic, Dr. Cherre Robins, who is contracted with his office to receive all pharmacy referrals.   Message left at office with patient information and to transfer referral.  Also provided my contact information if needed.    [facility] pharmacy case is being closed.   Ralene Bathe, PharmD, [place] [phone]

## 2019-12-29 DIAGNOSIS — I10 Essential (primary) hypertension: Secondary | ICD-10-CM | POA: Diagnosis not present

## 2020-01-02 DIAGNOSIS — Z1159 Encounter for screening for other viral diseases: Secondary | ICD-10-CM | POA: Diagnosis not present

## 2020-01-07 DIAGNOSIS — F0390 Unspecified dementia without behavioral disturbance: Secondary | ICD-10-CM | POA: Diagnosis not present

## 2020-01-07 DIAGNOSIS — Z8701 Personal history of pneumonia (recurrent): Secondary | ICD-10-CM | POA: Diagnosis not present

## 2020-01-07 DIAGNOSIS — I10 Essential (primary) hypertension: Secondary | ICD-10-CM | POA: Diagnosis not present

## 2020-01-07 DIAGNOSIS — R1310 Dysphagia, unspecified: Secondary | ICD-10-CM | POA: Diagnosis not present

## 2020-01-07 DIAGNOSIS — M545 Low back pain: Secondary | ICD-10-CM | POA: Diagnosis not present

## 2020-01-07 DIAGNOSIS — J1289 Other viral pneumonia: Secondary | ICD-10-CM | POA: Diagnosis not present

## 2020-01-07 DIAGNOSIS — F331 Major depressive disorder, recurrent, moderate: Secondary | ICD-10-CM | POA: Diagnosis not present

## 2020-01-07 DIAGNOSIS — M542 Cervicalgia: Secondary | ICD-10-CM | POA: Diagnosis not present

## 2020-01-07 DIAGNOSIS — G8929 Other chronic pain: Secondary | ICD-10-CM | POA: Diagnosis not present

## 2020-01-07 DIAGNOSIS — I951 Orthostatic hypotension: Secondary | ICD-10-CM | POA: Diagnosis not present

## 2020-01-07 DIAGNOSIS — Z79891 Long term (current) use of opiate analgesic: Secondary | ICD-10-CM | POA: Diagnosis not present

## 2020-01-07 DIAGNOSIS — I69391 Dysphagia following cerebral infarction: Secondary | ICD-10-CM | POA: Diagnosis not present

## 2020-01-07 DIAGNOSIS — Z9181 History of falling: Secondary | ICD-10-CM | POA: Diagnosis not present

## 2020-01-07 DIAGNOSIS — K2100 Gastro-esophageal reflux disease with esophagitis, without bleeding: Secondary | ICD-10-CM | POA: Diagnosis not present

## 2020-01-07 DIAGNOSIS — F0280 Dementia in other diseases classified elsewhere without behavioral disturbance: Secondary | ICD-10-CM | POA: Diagnosis not present

## 2020-01-07 DIAGNOSIS — E039 Hypothyroidism, unspecified: Secondary | ICD-10-CM | POA: Diagnosis not present

## 2020-01-08 DIAGNOSIS — G8929 Other chronic pain: Secondary | ICD-10-CM | POA: Diagnosis not present

## 2020-01-08 DIAGNOSIS — F0280 Dementia in other diseases classified elsewhere without behavioral disturbance: Secondary | ICD-10-CM | POA: Diagnosis not present

## 2020-01-08 DIAGNOSIS — M545 Low back pain: Secondary | ICD-10-CM | POA: Diagnosis not present

## 2020-01-08 DIAGNOSIS — M542 Cervicalgia: Secondary | ICD-10-CM | POA: Diagnosis not present

## 2020-01-08 DIAGNOSIS — I951 Orthostatic hypotension: Secondary | ICD-10-CM | POA: Diagnosis not present

## 2020-01-08 DIAGNOSIS — E039 Hypothyroidism, unspecified: Secondary | ICD-10-CM | POA: Diagnosis not present

## 2020-01-11 DIAGNOSIS — I951 Orthostatic hypotension: Secondary | ICD-10-CM | POA: Diagnosis not present

## 2020-01-11 DIAGNOSIS — M545 Low back pain: Secondary | ICD-10-CM | POA: Diagnosis not present

## 2020-01-11 DIAGNOSIS — F0280 Dementia in other diseases classified elsewhere without behavioral disturbance: Secondary | ICD-10-CM | POA: Diagnosis not present

## 2020-01-11 DIAGNOSIS — G8929 Other chronic pain: Secondary | ICD-10-CM | POA: Diagnosis not present

## 2020-01-11 DIAGNOSIS — M542 Cervicalgia: Secondary | ICD-10-CM | POA: Diagnosis not present

## 2020-01-11 DIAGNOSIS — E039 Hypothyroidism, unspecified: Secondary | ICD-10-CM | POA: Diagnosis not present

## 2020-01-13 DIAGNOSIS — M545 Low back pain: Secondary | ICD-10-CM | POA: Diagnosis not present

## 2020-01-13 DIAGNOSIS — M542 Cervicalgia: Secondary | ICD-10-CM | POA: Diagnosis not present

## 2020-01-13 DIAGNOSIS — G8929 Other chronic pain: Secondary | ICD-10-CM | POA: Diagnosis not present

## 2020-01-13 DIAGNOSIS — F0280 Dementia in other diseases classified elsewhere without behavioral disturbance: Secondary | ICD-10-CM | POA: Diagnosis not present

## 2020-01-13 DIAGNOSIS — I951 Orthostatic hypotension: Secondary | ICD-10-CM | POA: Diagnosis not present

## 2020-01-13 DIAGNOSIS — E039 Hypothyroidism, unspecified: Secondary | ICD-10-CM | POA: Diagnosis not present

## 2020-01-16 DIAGNOSIS — F0280 Dementia in other diseases classified elsewhere without behavioral disturbance: Secondary | ICD-10-CM | POA: Diagnosis not present

## 2020-01-16 DIAGNOSIS — M545 Low back pain: Secondary | ICD-10-CM | POA: Diagnosis not present

## 2020-01-16 DIAGNOSIS — I951 Orthostatic hypotension: Secondary | ICD-10-CM | POA: Diagnosis not present

## 2020-01-16 DIAGNOSIS — M542 Cervicalgia: Secondary | ICD-10-CM | POA: Diagnosis not present

## 2020-01-16 DIAGNOSIS — G8929 Other chronic pain: Secondary | ICD-10-CM | POA: Diagnosis not present

## 2020-01-16 DIAGNOSIS — E039 Hypothyroidism, unspecified: Secondary | ICD-10-CM | POA: Diagnosis not present

## 2020-01-19 DIAGNOSIS — G8929 Other chronic pain: Secondary | ICD-10-CM | POA: Diagnosis not present

## 2020-01-19 DIAGNOSIS — F0280 Dementia in other diseases classified elsewhere without behavioral disturbance: Secondary | ICD-10-CM | POA: Diagnosis not present

## 2020-01-19 DIAGNOSIS — E039 Hypothyroidism, unspecified: Secondary | ICD-10-CM | POA: Diagnosis not present

## 2020-01-19 DIAGNOSIS — M542 Cervicalgia: Secondary | ICD-10-CM | POA: Diagnosis not present

## 2020-01-19 DIAGNOSIS — I951 Orthostatic hypotension: Secondary | ICD-10-CM | POA: Diagnosis not present

## 2020-01-19 DIAGNOSIS — M545 Low back pain: Secondary | ICD-10-CM | POA: Diagnosis not present

## 2020-01-20 DIAGNOSIS — M545 Low back pain: Secondary | ICD-10-CM | POA: Diagnosis not present

## 2020-01-20 DIAGNOSIS — E039 Hypothyroidism, unspecified: Secondary | ICD-10-CM | POA: Diagnosis not present

## 2020-01-20 DIAGNOSIS — I951 Orthostatic hypotension: Secondary | ICD-10-CM | POA: Diagnosis not present

## 2020-01-20 DIAGNOSIS — M542 Cervicalgia: Secondary | ICD-10-CM | POA: Diagnosis not present

## 2020-01-20 DIAGNOSIS — G8929 Other chronic pain: Secondary | ICD-10-CM | POA: Diagnosis not present

## 2020-01-20 DIAGNOSIS — F0280 Dementia in other diseases classified elsewhere without behavioral disturbance: Secondary | ICD-10-CM | POA: Diagnosis not present

## 2020-01-26 DIAGNOSIS — G8929 Other chronic pain: Secondary | ICD-10-CM | POA: Diagnosis not present

## 2020-01-26 DIAGNOSIS — M545 Low back pain: Secondary | ICD-10-CM | POA: Diagnosis not present

## 2020-01-26 DIAGNOSIS — F0280 Dementia in other diseases classified elsewhere without behavioral disturbance: Secondary | ICD-10-CM | POA: Diagnosis not present

## 2020-01-26 DIAGNOSIS — E039 Hypothyroidism, unspecified: Secondary | ICD-10-CM | POA: Diagnosis not present

## 2020-01-26 DIAGNOSIS — I951 Orthostatic hypotension: Secondary | ICD-10-CM | POA: Diagnosis not present

## 2020-01-26 DIAGNOSIS — M542 Cervicalgia: Secondary | ICD-10-CM | POA: Diagnosis not present

## 2020-01-27 DIAGNOSIS — F0280 Dementia in other diseases classified elsewhere without behavioral disturbance: Secondary | ICD-10-CM | POA: Diagnosis not present

## 2020-01-27 DIAGNOSIS — E039 Hypothyroidism, unspecified: Secondary | ICD-10-CM | POA: Diagnosis not present

## 2020-01-27 DIAGNOSIS — M545 Low back pain: Secondary | ICD-10-CM | POA: Diagnosis not present

## 2020-01-27 DIAGNOSIS — I951 Orthostatic hypotension: Secondary | ICD-10-CM | POA: Diagnosis not present

## 2020-01-27 DIAGNOSIS — G8929 Other chronic pain: Secondary | ICD-10-CM | POA: Diagnosis not present

## 2020-01-27 DIAGNOSIS — M542 Cervicalgia: Secondary | ICD-10-CM | POA: Diagnosis not present

## 2020-01-30 DIAGNOSIS — M542 Cervicalgia: Secondary | ICD-10-CM | POA: Diagnosis not present

## 2020-01-30 DIAGNOSIS — F0280 Dementia in other diseases classified elsewhere without behavioral disturbance: Secondary | ICD-10-CM | POA: Diagnosis not present

## 2020-01-30 DIAGNOSIS — I951 Orthostatic hypotension: Secondary | ICD-10-CM | POA: Diagnosis not present

## 2020-01-30 DIAGNOSIS — E039 Hypothyroidism, unspecified: Secondary | ICD-10-CM | POA: Diagnosis not present

## 2020-01-30 DIAGNOSIS — M545 Low back pain: Secondary | ICD-10-CM | POA: Diagnosis not present

## 2020-01-30 DIAGNOSIS — G8929 Other chronic pain: Secondary | ICD-10-CM | POA: Diagnosis not present

## 2020-02-01 DIAGNOSIS — I951 Orthostatic hypotension: Secondary | ICD-10-CM | POA: Diagnosis not present

## 2020-02-01 DIAGNOSIS — G8929 Other chronic pain: Secondary | ICD-10-CM | POA: Diagnosis not present

## 2020-02-01 DIAGNOSIS — M545 Low back pain: Secondary | ICD-10-CM | POA: Diagnosis not present

## 2020-02-01 DIAGNOSIS — M542 Cervicalgia: Secondary | ICD-10-CM | POA: Diagnosis not present

## 2020-02-01 DIAGNOSIS — F0280 Dementia in other diseases classified elsewhere without behavioral disturbance: Secondary | ICD-10-CM | POA: Diagnosis not present

## 2020-02-01 DIAGNOSIS — E039 Hypothyroidism, unspecified: Secondary | ICD-10-CM | POA: Diagnosis not present

## 2020-02-06 DIAGNOSIS — F0390 Unspecified dementia without behavioral disturbance: Secondary | ICD-10-CM | POA: Diagnosis not present

## 2020-02-23 ENCOUNTER — Ambulatory Visit: Payer: Medicare Other | Attending: Internal Medicine

## 2020-02-23 DIAGNOSIS — Z23 Encounter for immunization: Secondary | ICD-10-CM | POA: Insufficient documentation

## 2020-02-23 NOTE — Progress Notes (Signed)
   Covid-19 Vaccination Clinic  Name:  Cindy [name]    MRN: [id number] DOB: 10/03/1930  02/23/2020  [name] was observed post Covid-19 immunization for 15 minutes without incident. She was provided with Vaccine Information Sheet and instruction to access the V-Safe system.   [name] was instructed to call 911 with any severe reactions post vaccine: Marland Kitchen Difficulty breathing  . Swelling of face and throat  . A fast heartbeat  . A bad rash all over body  . Dizziness and weakness   Immunizations Administered    Name Date Dose VIS Date Route   Pfizer COVID-19 Vaccine 02/23/2020 12:26 PM 0.3 mL 12/02/2019 Intramuscular   Manufacturer: [place]   Lot: [id number]   [place]: [id number]

## 2020-03-20 ENCOUNTER — Ambulatory Visit: Payer: Medicare Other | Attending: Internal Medicine

## 2020-03-20 DIAGNOSIS — Z23 Encounter for immunization: Secondary | ICD-10-CM

## 2020-03-20 NOTE — Progress Notes (Signed)
   Covid-19 Vaccination Clinic  Name:  Cindy [name]    MRN: [id number] DOB: Mar 31, 1930  03/20/2020  [name] was observed post Covid-19 immunization for 15 minutes without incident. She was provided with Vaccine Information Sheet and instruction to access the V-Safe system.   [name] was instructed to call 911 with any severe reactions post vaccine: Marland Kitchen Difficulty breathing  . Swelling of face and throat  . A fast heartbeat  . A bad rash all over body  . Dizziness and weakness   Immunizations Administered    Name Date Dose VIS Date Route   Pfizer COVID-19 Vaccine 03/20/2020  3:34 PM 0.3 mL 12/02/2019 Intramuscular   Manufacturer: [place]   Lot: [id number]   [place]: [id number]

## 2020-06-06 DIAGNOSIS — E876 Hypokalemia: Secondary | ICD-10-CM | POA: Diagnosis not present

## 2020-07-07 ENCOUNTER — Emergency Department (HOSPITAL_COMMUNITY): Payer: Medicare Other

## 2020-07-07 ENCOUNTER — Other Ambulatory Visit: Payer: Self-pay

## 2020-07-07 ENCOUNTER — Encounter (HOSPITAL_COMMUNITY): Payer: Self-pay

## 2020-07-07 ENCOUNTER — Inpatient Hospital Stay (HOSPITAL_COMMUNITY)
Admission: EM | Admit: 2020-07-07 | Discharge: 2020-07-12 | DRG: 535 | Disposition: A | Discharge status: Skilled Nursing Facility | Payer: Medicare Other | Attending: Internal Medicine | Admitting: Internal Medicine

## 2020-07-07 DIAGNOSIS — E890 Postprocedural hypothyroidism: Secondary | ICD-10-CM | POA: Diagnosis present

## 2020-07-07 DIAGNOSIS — Z85828 Personal history of other malignant neoplasm of skin: Secondary | ICD-10-CM

## 2020-07-07 DIAGNOSIS — S299XXA Unspecified injury of thorax, initial encounter: Secondary | ICD-10-CM | POA: Diagnosis not present

## 2020-07-07 DIAGNOSIS — J9811 Atelectasis: Secondary | ICD-10-CM | POA: Diagnosis present

## 2020-07-07 DIAGNOSIS — N179 Acute kidney failure, unspecified: Secondary | ICD-10-CM | POA: Diagnosis not present

## 2020-07-07 DIAGNOSIS — G2581 Restless legs syndrome: Secondary | ICD-10-CM | POA: Diagnosis present

## 2020-07-07 DIAGNOSIS — Z9981 Dependence on supplemental oxygen: Secondary | ICD-10-CM | POA: Diagnosis not present

## 2020-07-07 DIAGNOSIS — E785 Hyperlipidemia, unspecified: Secondary | ICD-10-CM | POA: Diagnosis present

## 2020-07-07 DIAGNOSIS — D649 Anemia, unspecified: Secondary | ICD-10-CM | POA: Diagnosis present

## 2020-07-07 DIAGNOSIS — I82401 Acute embolism and thrombosis of unspecified deep veins of right lower extremity: Secondary | ICD-10-CM | POA: Diagnosis not present

## 2020-07-07 DIAGNOSIS — F29 Unspecified psychosis not due to a substance or known physiological condition: Secondary | ICD-10-CM | POA: Diagnosis not present

## 2020-07-07 DIAGNOSIS — M62838 Other muscle spasm: Secondary | ICD-10-CM | POA: Diagnosis present

## 2020-07-07 DIAGNOSIS — H903 Sensorineural hearing loss, bilateral: Secondary | ICD-10-CM | POA: Diagnosis not present

## 2020-07-07 DIAGNOSIS — F0390 Unspecified dementia without behavioral disturbance: Secondary | ICD-10-CM | POA: Diagnosis present

## 2020-07-07 DIAGNOSIS — K219 Gastro-esophageal reflux disease without esophagitis: Secondary | ICD-10-CM | POA: Diagnosis present

## 2020-07-07 DIAGNOSIS — E559 Vitamin D deficiency, unspecified: Secondary | ICD-10-CM | POA: Diagnosis not present

## 2020-07-07 DIAGNOSIS — Z515 Encounter for palliative care: Secondary | ICD-10-CM | POA: Diagnosis not present

## 2020-07-07 DIAGNOSIS — Z9049 Acquired absence of other specified parts of digestive tract: Secondary | ICD-10-CM

## 2020-07-07 DIAGNOSIS — R627 Adult failure to thrive: Secondary | ICD-10-CM | POA: Diagnosis present

## 2020-07-07 DIAGNOSIS — J9601 Acute respiratory failure with hypoxia: Secondary | ICD-10-CM | POA: Diagnosis present

## 2020-07-07 DIAGNOSIS — Y92007 Garden or yard of unspecified non-institutional (private) residence as the place of occurrence of the external cause: Secondary | ICD-10-CM | POA: Diagnosis not present

## 2020-07-07 DIAGNOSIS — E039 Hypothyroidism, unspecified: Secondary | ICD-10-CM | POA: Diagnosis not present

## 2020-07-07 DIAGNOSIS — Z923 Personal history of irradiation: Secondary | ICD-10-CM

## 2020-07-07 DIAGNOSIS — W010XXA Fall on same level from slipping, tripping and stumbling without subsequent striking against object, initial encounter: Secondary | ICD-10-CM | POA: Diagnosis present

## 2020-07-07 DIAGNOSIS — R52 Pain, unspecified: Secondary | ICD-10-CM | POA: Diagnosis not present

## 2020-07-07 DIAGNOSIS — Z66 Do not resuscitate: Secondary | ICD-10-CM | POA: Diagnosis present

## 2020-07-07 DIAGNOSIS — H9192 Unspecified hearing loss, left ear: Secondary | ICD-10-CM | POA: Diagnosis present

## 2020-07-07 DIAGNOSIS — G4700 Insomnia, unspecified: Secondary | ICD-10-CM | POA: Diagnosis not present

## 2020-07-07 DIAGNOSIS — F329 Major depressive disorder, single episode, unspecified: Secondary | ICD-10-CM | POA: Diagnosis present

## 2020-07-07 DIAGNOSIS — E8889 Other specified metabolic disorders: Secondary | ICD-10-CM | POA: Diagnosis present

## 2020-07-07 DIAGNOSIS — R41841 Cognitive communication deficit: Secondary | ICD-10-CM | POA: Diagnosis not present

## 2020-07-07 DIAGNOSIS — D630 Anemia in neoplastic disease: Secondary | ICD-10-CM | POA: Diagnosis present

## 2020-07-07 DIAGNOSIS — Z6821 Body mass index (BMI) 21.0-21.9, adult: Secondary | ICD-10-CM

## 2020-07-07 DIAGNOSIS — Z9181 History of falling: Secondary | ICD-10-CM | POA: Diagnosis not present

## 2020-07-07 DIAGNOSIS — F331 Major depressive disorder, recurrent, moderate: Secondary | ICD-10-CM | POA: Diagnosis not present

## 2020-07-07 DIAGNOSIS — I10 Essential (primary) hypertension: Secondary | ICD-10-CM

## 2020-07-07 DIAGNOSIS — M6281 Muscle weakness (generalized): Secondary | ICD-10-CM | POA: Diagnosis not present

## 2020-07-07 DIAGNOSIS — I2699 Other pulmonary embolism without acute cor pulmonale: Secondary | ICD-10-CM | POA: Diagnosis not present

## 2020-07-07 DIAGNOSIS — Z20822 Contact with and (suspected) exposure to covid-19: Secondary | ICD-10-CM | POA: Diagnosis present

## 2020-07-07 DIAGNOSIS — S32409A Unspecified fracture of unspecified acetabulum, initial encounter for closed fracture: Secondary | ICD-10-CM | POA: Diagnosis present

## 2020-07-07 DIAGNOSIS — R531 Weakness: Secondary | ICD-10-CM

## 2020-07-07 DIAGNOSIS — E860 Dehydration: Secondary | ICD-10-CM | POA: Diagnosis present

## 2020-07-07 DIAGNOSIS — R278 Other lack of coordination: Secondary | ICD-10-CM | POA: Diagnosis not present

## 2020-07-07 DIAGNOSIS — W19XXXA Unspecified fall, initial encounter: Secondary | ICD-10-CM | POA: Diagnosis not present

## 2020-07-07 DIAGNOSIS — R5381 Other malaise: Secondary | ICD-10-CM | POA: Diagnosis not present

## 2020-07-07 DIAGNOSIS — Z8249 Family history of ischemic heart disease and other diseases of the circulatory system: Secondary | ICD-10-CM

## 2020-07-07 DIAGNOSIS — S32401D Unspecified fracture of right acetabulum, subsequent encounter for fracture with routine healing: Secondary | ICD-10-CM | POA: Diagnosis not present

## 2020-07-07 DIAGNOSIS — Z79899 Other long term (current) drug therapy: Secondary | ICD-10-CM

## 2020-07-07 DIAGNOSIS — R918 Other nonspecific abnormal finding of lung field: Secondary | ICD-10-CM | POA: Diagnosis not present

## 2020-07-07 DIAGNOSIS — Z885 Allergy status to narcotic agent status: Secondary | ICD-10-CM

## 2020-07-07 DIAGNOSIS — M810 Age-related osteoporosis without current pathological fracture: Secondary | ICD-10-CM | POA: Diagnosis not present

## 2020-07-07 DIAGNOSIS — C210 Malignant neoplasm of anus, unspecified: Secondary | ICD-10-CM | POA: Diagnosis present

## 2020-07-07 DIAGNOSIS — Z808 Family history of malignant neoplasm of other organs or systems: Secondary | ICD-10-CM

## 2020-07-07 DIAGNOSIS — F419 Anxiety disorder, unspecified: Secondary | ICD-10-CM | POA: Diagnosis not present

## 2020-07-07 DIAGNOSIS — Z7401 Bed confinement status: Secondary | ICD-10-CM | POA: Diagnosis not present

## 2020-07-07 DIAGNOSIS — R188 Other ascites: Secondary | ICD-10-CM | POA: Diagnosis not present

## 2020-07-07 DIAGNOSIS — Z85038 Personal history of other malignant neoplasm of large intestine: Secondary | ICD-10-CM | POA: Diagnosis not present

## 2020-07-07 DIAGNOSIS — Z79890 Hormone replacement therapy (postmenopausal): Secondary | ICD-10-CM

## 2020-07-07 DIAGNOSIS — S32401A Unspecified fracture of right acetabulum, initial encounter for closed fracture: Principal | ICD-10-CM | POA: Diagnosis present

## 2020-07-07 DIAGNOSIS — M2550 Pain in unspecified joint: Secondary | ICD-10-CM | POA: Diagnosis not present

## 2020-07-07 DIAGNOSIS — R410 Disorientation, unspecified: Secondary | ICD-10-CM | POA: Diagnosis not present

## 2020-07-07 DIAGNOSIS — Z888 Allergy status to other drugs, medicaments and biological substances status: Secondary | ICD-10-CM

## 2020-07-07 DIAGNOSIS — F03918 Unspecified dementia, unspecified severity, with other behavioral disturbance: Secondary | ICD-10-CM

## 2020-07-07 DIAGNOSIS — Z85048 Personal history of other malignant neoplasm of rectum, rectosigmoid junction, and anus: Secondary | ICD-10-CM | POA: Diagnosis not present

## 2020-07-07 DIAGNOSIS — M1990 Unspecified osteoarthritis, unspecified site: Secondary | ICD-10-CM | POA: Diagnosis not present

## 2020-07-07 DIAGNOSIS — F0391 Unspecified dementia with behavioral disturbance: Secondary | ICD-10-CM

## 2020-07-07 LAB — CBC WITH DIFFERENTIAL/PLATELET
Abs Immature Granulocytes: 0.07 10*3/uL (ref 0.00–0.07)
Basophils Absolute: 0.1 10*3/uL (ref 0.0–0.1)
Basophils Relative: 0 %
Eosinophils Absolute: 0 10*3/uL (ref 0.0–0.5)
Eosinophils Relative: 0 %
HCT: 36.6 % (ref 36.0–46.0)
Hemoglobin: 11.8 g/dL — ABNORMAL LOW (ref 12.0–15.0)
Immature Granulocytes: 1 %
Lymphocytes Relative: 9 %
Lymphs Abs: 1.3 10*3/uL (ref 0.7–4.0)
MCH: 31.7 pg (ref 26.0–34.0)
MCHC: 32.2 g/dL (ref 30.0–36.0)
MCV: 98.4 fL (ref 80.0–100.0)
Monocytes Absolute: 0.7 10*3/uL (ref 0.1–1.0)
Monocytes Relative: 5 %
Neutro Abs: 12.3 10*3/uL — ABNORMAL HIGH (ref 1.7–7.7)
Neutrophils Relative %: 85 %
Platelets: 263 10*3/uL (ref 150–400)
RBC: 3.72 MIL/uL — ABNORMAL LOW (ref 3.87–5.11)
RDW: 12.2 % (ref 11.5–15.5)
WBC: 14.4 10*3/uL — ABNORMAL HIGH (ref 4.0–10.5)
nRBC: 0 % (ref 0.0–0.2)

## 2020-07-07 LAB — BASIC METABOLIC PANEL
Anion gap: 13 (ref 5–15)
BUN: 17 mg/dL (ref 8–23)
CO2: 19 mmol/L — ABNORMAL LOW (ref 22–32)
Calcium: 9.3 mg/dL (ref 8.9–10.3)
Chloride: 109 mmol/L (ref 98–111)
Creatinine, Ser: 1.21 mg/dL — ABNORMAL HIGH (ref 0.44–1.00)
GFR calc Af Amer: 46 mL/min — ABNORMAL LOW (ref >60)
GFR calc non Af Amer: 40 mL/min — ABNORMAL LOW (ref >60)
Glucose, Bld: 123 mg/dL — ABNORMAL HIGH (ref 70–99)
Potassium: 3.5 mmol/L (ref 3.5–5.1)
Sodium: 141 mmol/L (ref 135–145)

## 2020-07-07 LAB — PROTIME-INR
INR: 1.1 (ref 0.8–1.2)
Prothrombin Time: 13.3 seconds (ref 11.4–15.2)

## 2020-07-07 LAB — SARS CORONAVIRUS 2 BY RT PCR (HOSPITAL ORDER, PERFORMED IN ~~LOC~~ HOSPITAL LAB): SARS Coronavirus 2: NEGATIVE

## 2020-07-07 MED ORDER — HYDROMORPHONE HCL 1 MG/ML IJ SOLN
1.0000 mg | Freq: Once | INTRAMUSCULAR | Status: AC
  Administered 2020-07-07: 18:00:00 1 mg via INTRAVENOUS
  Filled 2020-07-07: qty 1

## 2020-07-07 MED ORDER — LEVOTHYROXINE SODIUM 100 MCG/5ML IV SOLN: 44.0000 ug | Freq: Every day | INTRAVENOUS | Status: DC

## 2020-07-07 MED ORDER — ONDANSETRON HCL 4 MG/2ML IJ SOLN
4.0000 mg | Freq: Once | INTRAMUSCULAR | Status: AC
  Administered 2020-07-07: 18:00:00 4 mg via INTRAVENOUS
  Filled 2020-07-07: qty 2

## 2020-07-07 MED ORDER — FENTANYL CITRATE (PF) 100 MCG/2ML IJ SOLN
12.5000 ug | INTRAMUSCULAR | Status: DC | PRN
  Administered 2020-07-07 – 2020-07-08 (×2): 12.5 ug via INTRAVENOUS
  Filled 2020-07-07 (×3): qty 2

## 2020-07-07 MED ORDER — HYDRALAZINE HCL 20 MG/ML IJ SOLN: 10.0000 mg | INTRAMUSCULAR | Status: DC | PRN

## 2020-07-07 NOTE — ED Notes (Signed)
Esmond Harps daughter [phone]

## 2020-07-07 NOTE — ED Notes (Signed)
Pt transported to XR.  

## 2020-07-07 NOTE — ED Provider Notes (Addendum)
[facility] EMERGENCY DEPARTMENT Provider Note   CSN: [id number] Arrival date & time: 07/07/20  1638     History Chief Complaint  Patient presents with  . Fall    Cindy [name] is a 86 y.o. female.  Patient is an 86 year old female with history of hypertension, hyperlipidemia, GERD, hypothyroidism.  She presents today for evaluation of fall.  Patient states that she was walking in her backyard when she tripped and fell and injured her right hip.  She was unable to stand and walk afterward, but was able to call for help.  EMS transported patient here complaining of severe right hip pain.  She denies other injury.  The history is provided by the patient.  Fall This is a new problem. The current episode started 1 to 2 hours ago. The problem occurs constantly. The problem has not changed since onset.Pertinent negatives include no chest pain and no headaches. Exacerbated by: Movement and palpation. Nothing relieves the symptoms. She has tried nothing for the symptoms.       Past Medical History:  Diagnosis Date  . Arthritis   . Cancer ([place])    skin  . Colon cancer ([place]) 08/31/13   invasive squamous cell  . Deaf, left   . Depression   . GERD (gastroesophageal reflux disease)   . History of radiation therapy 10/10/13-11/23/13   anal canal 54GY/  . Hyperlipemia   . Hypertension   . Hypothyroidism   . PONV (postoperative nausea and vomiting)   . Skin cancer    Shingles 2012 Bad case    Patient Active Problem List   Diagnosis Date Noted  . Sepsis ([place]) 03/09/2017  . Nausea vomiting and diarrhea 03/09/2017  . Elevated troponin 03/09/2017  . Chest pain 12/25/2013  . Dyspnea 12/25/2013  . Restless leg syndrome 12/25/2013  . Mucositis 10/27/2013  . Hypophosphatemia 10/27/2013  . Protein-calorie malnutrition, severe ([place]) 10/25/2013  . Thrombocytopenia ([place]) 10/25/2013  . Hypomagnesemia 10/24/2013  . Febrile neutropenia ([place]) 10/23/2013  . Anal cancer  ([place]) 09/09/2013  . Colon cancer ([place]) 08/31/2013  . Colitis, acute 03/17/2013  . Gastroparesis 10/22/2012  . Hand pain, left 10/22/2012  . Anxiety 10/18/2012  . Hyperventilation syndrome 10/18/2012  . Headache(784.0) 10/18/2012  . Neck pain on right side 10/18/2012  . Hypertension 10/18/2012  . GERD (gastroesophageal reflux disease) 10/18/2012  . Hyperlipidemia 10/18/2012  . Insomnia 10/18/2012  . Hearing loss sensory, bilateral 10/18/2012  . 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 Weakness 10/17/2012  . Hyponatremia 10/17/2012  . Hypokalemia 10/17/2012  . Hypothyroidism 10/17/2012  . Fatigue 10/17/2012    Past Surgical History:  Procedure Laterality Date  . ABDOMINAL HYSTERECTOMY    . APPENDECTOMY    . CESAREAN SECTION     2  . CHOLECYSTECTOMY    . COLONOSCOPY  08/31/13   invasive squamous cell colon  . ERCP    . ESOPHAGOGASTRODUODENOSCOPY  10/20/2012   Procedure: ESOPHAGOGASTRODUODENOSCOPY (EGD);  Surgeon: Arta Silence, MD;  Location: [facility];  Service: Endoscopy;  Laterality: Left;  . EYE SURGERY     cataracts  . KNEE ARTHROSCOPY  05/11/2012   Procedure: ARTHROSCOPY KNEE;  Surgeon: Hessie Dibble, MD;  Location: [facility];  Service: Orthopedics;  Laterality: Right;  left knee medial menisectomy and chondroplasty  . THYROIDECTOMY, PARTIAL    . TONSILLECTOMY       OB History   No obstetric history on file.     Family History  Problem  Relation Age of Onset  . Kidney disease Mother   . Heart attack Father   . Heart disease Father   . Cancer Sister        breast  . Cancer Sister        ovarian  . Cancer Sister        melanoma  . Cancer Daughter        breast    Social History   Tobacco Use  . Smoking status: Never Smoker  . Smokeless tobacco: Never Used  Substance Use Topics  . Alcohol use: No  . Drug use: No    Home Medications Prior to Admission medications   Medication Sig Start Date End Date Taking?  Authorizing Provider  amLODipine-valsartan (EXFORGE) 5-160 MG tablet TK 1 T PO QD 06/18/17   [provider]  aspirin-acetaminophen-caffeine (EXCEDRIN MIGRAINE) [phone] MG tablet Take 1 tablet by mouth every 6 (six) hours as needed for headache (back pain).    [provider]  ciprofloxacin (CIPRO) 500 MG tablet Take 1 tablet (500 mg total) by mouth 2 (two) times daily. Patient not taking: Reported on 07/06/2017 03/13/17   Annita Brod, MD  FLUoxetine (PROZAC) 40 MG capsule Take 40 mg by mouth every morning.     [provider]  hydrALAZINE (APRESOLINE) 25 MG tablet Take 1 tablet (25 mg total) by mouth every 8 (eight) hours. Patient not taking: Reported on 09/12/2019 07/06/17   Sherwood Gambler, MD  KLOR-CON M20 20 MEQ tablet Take 20 mEq by mouth 2 (two) times daily.  02/11/17   [provider]  levothyroxine (SYNTHROID, LEVOTHROID) 88 MCG tablet Take 88 mcg by mouth daily before breakfast.    [provider]  metroNIDAZOLE (FLAGYL) 500 MG tablet Take 1 tablet (500 mg total) by mouth 3 (three) times daily. Patient not taking: Reported on 07/06/2017 03/13/17   Annita Brod, MD  Multiple Vitamin (MULTIVITAMIN WITH MINERALS) TABS tablet Take 1 tablet by mouth daily. Patient not taking: Reported on 09/12/2019 10/27/13   Rama, Venetia Maxon, MD  Nutritional Supplements (JUICE PLUS FIBRE PO) Take 1 tablet by mouth daily.    [provider]  Omega-3 Fatty Acids (FISH OIL) 1000 MG CAPS Take 1 capsule by mouth daily.    [provider]  ondansetron (ZOFRAN ODT) 4 MG disintegrating tablet Take 1 tablet (4 mg total) by mouth every 8 (eight) hours as needed for nausea or vomiting. 07/06/17   Sherwood Gambler, MD  PREMARIN 0.3 MG tablet Take 0.3 mg by mouth daily. 08/14/16   [provider]  promethazine (PHENERGAN) 12.5 MG tablet Take 1 tablet (12.5 mg total) by mouth every 6 (six) hours as needed for nausea or vomiting. Patient not  taking: Reported on 09/12/2019 07/06/17   Sherwood Gambler, MD    Allergies    Vancomycin, Zosyn [piperacillin sod-tazobactam so], Hydrocodone, and Morphine and related  Review of Systems   Review of Systems  Cardiovascular: Negative for chest pain.  Neurological: Negative for headaches.  All other systems reviewed and are negative.   Physical Exam Updated Vital Signs BP (!) 141/59 (BP Location: Right Arm)   Pulse 68   Temp 97.9 F (36.6 C) (Oral)   Resp 15   Ht 5\' 5"  (1.651 m)   Wt 59 kg   SpO2 98%   BMI 21.63 kg/m   Physical Exam Vitals and nursing note reviewed.  Constitutional:      General: She is not in acute distress.  Appearance: She is well-developed. She is not diaphoretic.  HENT:     Head: Normocephalic and atraumatic.  Cardiovascular:     Rate and Rhythm: Normal rate and regular rhythm.     Heart sounds: No murmur heard.  No friction rub. No gallop.   Pulmonary:     Effort: Pulmonary effort is normal. No respiratory distress.     Breath sounds: Normal breath sounds. No wheezing.  Abdominal:     General: Bowel sounds are normal. There is no distension.     Palpations: Abdomen is soft.     Tenderness: There is no abdominal tenderness.  Musculoskeletal:        General: Normal range of motion.     Cervical back: Normal range of motion and neck supple.     Comments: The right hip is tender over the greater trochanter.  There is pain with any range of motion.  Distal DP pulses are easily palpable bilaterally.  Motor and sensation are intact throughout both lower extremities.  Skin:    General: Skin is warm and dry.  Neurological:     Mental Status: She is alert and oriented to person, place, and time.     ED Results / Procedures / Treatments   Labs (all labs ordered are listed, but only abnormal results are displayed) Labs Reviewed  BASIC METABOLIC PANEL  CBC WITH DIFFERENTIAL/PLATELET  PROTIME-INR    EKG EKG Interpretation  Date/Time:  Saturday  July 07 2020 20:57:10 EDT Ventricular Rate:  78 PR Interval:    QRS Duration: 95 QT Interval:  387 QTC Calculation: 441 R Axis:   45 Text Interpretation: Sinus rhythm Anterior infarct, old Nonspecific T abnormalities, lateral leads No significant change since 07/06/2017 Confirmed by Veryl Speak [id number]) on 07/07/2020 9:02:21 PM   Radiology No results found.  Procedures Procedures (including critical care time)  Medications Ordered in ED Medications - No data to display  ED Course  I have reviewed the triage vital signs and the nursing notes.  Pertinent labs & imaging results that were available during my care of the patient were reviewed by me and considered in my medical decision making (see chart for details).    MDM Rules/Calculators/A&P  Patient is an 86 year old female presenting for hip pain after a fall.  Patient was walking in her backyard when she tripped and fell on her hip and was unable to stand and walk afterwards.  X-rays show what appears to be an acetabular fracture of the right pelvis.  This finding was discussed with Dr. Marlou Sa from orthopedics.  He is recommending n.p.o. after midnight and admission to the hospitalist service.  I have spoken with Dr. Hal Hope who agrees to admit.  Chest x-ray and other laboratory studies are unremarkable.  Final Clinical Impression(s) / ED Diagnoses Final diagnoses:  None    Rx / DC Orders ED Discharge Orders    None       Veryl Speak, MD 07/07/20 2010    Veryl Speak, MD 07/07/20 2102

## 2020-07-07 NOTE — H&P (Signed)
History and Physical    [name] UKG:[id number] DOB: Nov 13, 1930 DOA: 07/07/2020  PCP: Josetta Huddle, MD  Patient coming from: Home.  Chief Complaint: Fall.  HPI: Cindy [name] is a 86 y.o. female with history of hypertension, hypothyroidism, anal cancer in remission who was brought to the ER after patient had a fall at home.  Patient states she was in the ER when she tripped and fell and hit her back.  Denies hitting her head or losing consciousness.  ED Course: In the ER patient had a CT of the pelvis which shows right-sided acetabular fracture for which on-call orthopedic surgeon Dr. Marlou Sa was consulted patient admitted for further management.  Labs are remarkable for WBC count of 14.4 hemoglobin 11.8 creatinine 1.2.  Covid test was negative.  Review of Systems: As per HPI, rest all negative.   Past Medical History:  Diagnosis Date  . Arthritis   . Cancer ([place])    skin  . Colon cancer ([place]) 08/31/13   invasive squamous cell  . Deaf, left   . Depression   . GERD (gastroesophageal reflux disease)   . History of radiation therapy 10/10/13-11/23/13   anal canal 54GY/  . Hyperlipemia   . Hypertension   . Hypothyroidism   . PONV (postoperative nausea and vomiting)   . Skin cancer    Shingles 2012 Bad case    Past Surgical History:  Procedure Laterality Date  . ABDOMINAL HYSTERECTOMY    . APPENDECTOMY    . CESAREAN SECTION     2  . CHOLECYSTECTOMY    . COLONOSCOPY  08/31/13   invasive squamous cell colon  . ERCP    . ESOPHAGOGASTRODUODENOSCOPY  10/20/2012   Procedure: ESOPHAGOGASTRODUODENOSCOPY (EGD);  Surgeon: Arta Silence, MD;  Location: [facility];  Service: Endoscopy;  Laterality: Left;  . EYE SURGERY     cataracts  . KNEE ARTHROSCOPY  05/11/2012   Procedure: ARTHROSCOPY KNEE;  Surgeon: Hessie Dibble, MD;  Location: [facility];  Service: Orthopedics;  Laterality: Right;  left knee medial menisectomy and chondroplasty  .  THYROIDECTOMY, PARTIAL    . TONSILLECTOMY       reports that she has never smoked. She has never used smokeless tobacco. She reports that she does not drink alcohol and does not use drugs.  Allergies  Allergen Reactions  . Vancomycin Hives    Zosyn and vanc given together; unclear which was precipitating med. Appears to have tolerated cephalosporins in the past.  . Zosyn [Piperacillin Sod-Tazobactam So] Hives    Zosyn and vanc given together; unclear which was precipitating med. Appears to have tolerated cephalosporins in the past.  . Hydrocodone Nausea Only  . Morphine And Related Hives    Family History  Problem Relation Age of Onset  . Kidney disease Mother   . Heart attack Father   . Heart disease Father   . Cancer Sister        breast  . Cancer Sister        ovarian  . Cancer Sister        melanoma  . Cancer Daughter        breast    Prior to Admission medications   Medication Sig Start Date End Date Taking? Authorizing Provider  amLODipine-valsartan (EXFORGE) 5-160 MG tablet TK 1 T PO QD 06/18/17   [provider]  aspirin-acetaminophen-caffeine (EXCEDRIN MIGRAINE) [phone] MG tablet Take 1 tablet by mouth every 6 (six) hours as needed for headache (back  pain).    [provider]  ciprofloxacin (CIPRO) 500 MG tablet Take 1 tablet (500 mg total) by mouth 2 (two) times daily. Patient not taking: Reported on 07/06/2017 03/13/17   Annita Brod, MD  FLUoxetine (PROZAC) 40 MG capsule Take 40 mg by mouth every morning.     [provider]  hydrALAZINE (APRESOLINE) 25 MG tablet Take 1 tablet (25 mg total) by mouth every 8 (eight) hours. Patient not taking: Reported on 09/12/2019 07/06/17   Sherwood Gambler, MD  KLOR-CON M20 20 MEQ tablet Take 20 mEq by mouth 2 (two) times daily.  02/11/17   [provider]  levothyroxine (SYNTHROID, LEVOTHROID) 88 MCG tablet Take 88 mcg by mouth daily before breakfast.    [provider]   metroNIDAZOLE (FLAGYL) 500 MG tablet Take 1 tablet (500 mg total) by mouth 3 (three) times daily. Patient not taking: Reported on 07/06/2017 03/13/17   Annita Brod, MD  Multiple Vitamin (MULTIVITAMIN WITH MINERALS) TABS tablet Take 1 tablet by mouth daily. Patient not taking: Reported on 09/12/2019 10/27/13   Rama, Venetia [place], MD  Nutritional Supplements (JUICE PLUS FIBRE PO) Take 1 tablet by mouth daily.    [provider]  Omega-3 Fatty Acids (FISH OIL) 1000 MG CAPS Take 1 capsule by mouth daily.    [provider]  ondansetron (ZOFRAN ODT) 4 MG disintegrating tablet Take 1 tablet (4 mg total) by mouth every 8 (eight) hours as needed for nausea or vomiting. 07/06/17   Sherwood Gambler, MD  PREMARIN 0.3 MG tablet Take 0.3 mg by mouth daily. 08/14/16   [provider]  promethazine (PHENERGAN) 12.5 MG tablet Take 1 tablet (12.5 mg total) by mouth every 6 (six) hours as needed for nausea or vomiting. Patient not taking: Reported on 09/12/2019 07/06/17   Sherwood Gambler, MD    Physical Exam: Constitutional: Moderately built and nourished. Vitals:   07/07/20 1642 07/07/20 1643 07/07/20 1915 07/07/20 1930  BP:  (!) 141/59 (!) 129/57 136/61  Pulse:  68 87 85  Resp:  15 14 15   Temp:  97.9 F (36.6 C)    TempSrc:  Oral    SpO2:  98% 98% 99%  Weight: 59 kg     Height: 5\' 5"  (1.651 m)      Eyes: Anicteric no pallor. ENMT: No discharge from the ears eyes nose or mouth. Neck: No mass felt.  No neck rigidity. Respiratory: No rhonchi or crepitations. Cardiovascular: S1-S2 heard. Abdomen: Soft nontender bowel sounds present. Musculoskeletal: No edema. Skin: No rash. Neurologic: Alert awake oriented time place and person.  Moves all extremities. Psychiatric: Appears normal per normal affect.   Labs on Admission: I have personally reviewed following labs and imaging studies  CBC: Recent Labs  Lab 07/07/20 1802  WBC 14.4*  NEUTROABS 12.3*  HGB 11.8*  HCT 36.6   MCV 98.4  PLT 253   Basic Metabolic Panel: Recent Labs  Lab 07/07/20 1802  NA 141  K 3.5  CL 109  CO2 19*  GLUCOSE 123*  BUN 17  CREATININE 1.21*  CALCIUM 9.3   GFR: Estimated Creatinine Clearance: 28.9 mL/min (A) (by C-G formula based on SCr of 1.21 mg/dL (H)). Liver Function Tests: No results for input(s): AST, ALT, ALKPHOS, BILITOT, PROT, ALBUMIN in the last 168 hours. No results for input(s): LIPASE, AMYLASE in the last 168 hours. No results for input(s): AMMONIA in the last 168 hours. Coagulation Profile: Recent Labs  Lab 07/07/20 1802  INR 1.1  Cardiac Enzymes: No results for input(s): CKTOTAL, CKMB, CKMBINDEX, TROPONINI in the last 168 hours. BNP (last 3 results) No results for input(s): PROBNP in the last 8760 hours. HbA1C: No results for input(s): HGBA1C in the last 72 hours. CBG: No results for input(s): GLUCAP in the last 168 hours. Lipid Profile: No results for input(s): CHOL, HDL, LDLCALC, TRIG, CHOLHDL, LDLDIRECT in the last 72 hours. Thyroid Function Tests: No results for input(s): TSH, T4TOTAL, FREET4, T3FREE, THYROIDAB in the last 72 hours. Anemia Panel: No results for input(s): VITAMINB12, FOLATE, FERRITIN, TIBC, IRON, RETICCTPCT in the last 72 hours. Urine analysis:    Component Value Date/Time   COLORURINE COLORLESS (A) 07/06/2017 1630   APPEARANCEUR CLEAR 07/06/2017 1630   LABSPEC 1.003 (L) 07/06/2017 1630   LABSPEC 1.020 12/02/2013 1329   PHURINE 7.0 07/06/2017 1630   GLUCOSEU NEGATIVE 07/06/2017 1630   GLUCOSEU Negative 12/02/2013 1329   HGBUR SMALL (A) 07/06/2017 1630   BILIRUBINUR NEGATIVE 07/06/2017 1630   BILIRUBINUR Negative 12/02/2013 1329   KETONESUR NEGATIVE 07/06/2017 1630   PROTEINUR NEGATIVE 07/06/2017 1630   UROBILINOGEN 0.2 12/24/2013 1938   UROBILINOGEN 0.2 12/02/2013 1329   NITRITE NEGATIVE 07/06/2017 1630   LEUKOCYTESUR NEGATIVE 07/06/2017 1630   LEUKOCYTESUR Trace 12/02/2013 1329   Sepsis  Labs: @LABRCNTIP (procalcitonin:4,lacticidven:4) )No results found for this or any previous visit (from the past 240 hour(s)).   Radiological Exams on Admission: DG Chest 1 View  Result Date: 07/07/2020 CLINICAL DATA:  Patient fell earlier today while outside at home. EXAM: CHEST  1 VIEW COMPARISON:  Chest radiograph 03/09/2017 FINDINGS: Stable cardiomediastinal contours. Low lung volumes. No focal consolidation. No pneumothorax or large pleural effusion. No acute finding in the visualized skeleton. IMPRESSION: No acute cardiopulmonary finding. Electronically Signed   By: Audie Pinto M.D.   On: 07/07/2020 18:11   CT PELVIS WO CONTRAST  Result Date: 07/07/2020 CLINICAL DATA:  Right pelvic/is tabular fracture. EXAM: CT PELVIS WITHOUT CONTRAST TECHNIQUE: Multidetector CT imaging of the pelvis was performed following the standard protocol without intravenous contrast. COMPARISON:  CT of the abdomen and pelvis June 13, 2003 FINDINGS: Urinary Tract: There is fat stranding surrounding the urinary bladder, but the urinary bladder itself is intact. Bowel:  Unremarkable visualized pelvic bowel loops. Vascular/Lymphatic: No pathologically enlarged lymph nodes. No significant vascular abnormality seen. Reproductive: No mass or other significant abnormality, post hysterectomy. Other: Fat stranding and small fluid collection within the right pelvis and along the right lower abdominal and pelvic wall. Musculoskeletal: The visualized portion of the right humerus is intact. There is a comminuted triplane displaced anterior and posterior right acetabular fracture. The right humeral head is normally located within the fractured acetabulum. Several fracture lines extend intra-articularly into the right hip joint. The remainder of the pelvic bones are intact. IMPRESSION: 1. Comminuted triplane displaced anterior and posterior right acetabular fracture. Several fracture lines extend intra-articularly into the right hip  joint. 2. Fat stranding and small fluid collection within the right pelvis and along the right lower abdominal and pelvic wall. 3. Fat stranding surrounding the urinary bladder, but the urinary bladder itself is intact. Electronically Signed   By: Fidela Salisbury M.D.   On: 07/07/2020 19:39   DG Hip Unilat W or Wo Pelvis 2-3 Views Right  Result Date: 07/07/2020 CLINICAL DATA:  Patient fell earlier today while outside at home. EXAM: DG HIP (WITH OR WITHOUT PELVIS) 2-3V RIGHT COMPARISON:  CT abdomen pelvis 07/06/2017 FINDINGS: Osteopenia. There is disruption of the iliopectineal line consistent with  a displaced acetabular fracture. There is associated medial migration of the femoral head. The femoral head and proximal femur appear intact. IMPRESSION: Displaced fracture of the right acetabulum. Recommend CT pelvis for full characterization. Electronically Signed   By: Audie Pinto M.D.   On: 07/07/2020 18:10    EKG: Independently reviewed.  Normal sinus rhythm.  Assessment/Plan Principal Problem:   Acetabular fracture (HCC) Active Problems:   Hypothyroidism   Hypertension   Anal cancer ([place])    1. Right-sided acetabular fracture for which on-call orthopedic surgeon Dr. Marlou Sa has been consulted.  We will keep patient n.p.o. past midnight in anticipation of possible procedure and pain medications. 2. Acute renal failure could be from dehydration patient also takes ACE inhibitor.  We will gently hydrate follow metabolic panel in the morning. 3. Hypertension since patient is n.p.o. we will give patient as needed IV hydralazine. 4. Hypothyroidism we will keep patient IV Synthroid until patient can eat. 5. Anemia appears to be chronic.  Follow CBC.  Since patient has a stable fracture will need more than 2 midnight stay in inpatient status.   DVT prophylaxis: SCDs for now.  Anticipating a possible procedure holding off pharmacological DVT prophylaxis for now. Code Status: Full  code. Family Communication: Patient's daughter. Disposition Plan: May need rehab. Consults called: Orthopedics. Admission status: Inpatient.   Rise Patience MD [facility] Pager [phone].  If 7PM-7AM, please contact night-coverage [email or web address] Password [facility]  07/07/2020, 8:33 PM

## 2020-07-07 NOTE — ED Triage Notes (Signed)
Pt BIB [facility] following fall in yard, c/o R side/hip pain. Pt received 100 fentayl per EMS w/o relief. VSS, A&Ox4.

## 2020-07-07 NOTE — ED Notes (Signed)
Pt cleaned after bowel incontinence. Purewick also placed on pt. New warm blankets provided.

## 2020-07-08 DIAGNOSIS — S32401A Unspecified fracture of right acetabulum, initial encounter for closed fracture: Principal | ICD-10-CM

## 2020-07-08 LAB — SURGICAL PCR SCREEN
MRSA, PCR: NEGATIVE
Staphylococcus aureus: POSITIVE — AB

## 2020-07-08 MED ORDER — AMLODIPINE BESYLATE 5 MG PO TABS
5.0000 mg | ORAL_TABLET | Freq: Every day | ORAL | Status: DC
  Administered 2020-07-08 – 2020-07-12 (×5): 5 mg via ORAL
  Filled 2020-07-08 (×5): qty 1

## 2020-07-08 MED ORDER — CHLORHEXIDINE GLUCONATE CLOTH 2 % EX PADS
6.0000 | MEDICATED_PAD | Freq: Every day | CUTANEOUS | Status: AC
  Administered 2020-07-08 – 2020-07-12 (×5): 6 via TOPICAL

## 2020-07-08 MED ORDER — BUPROPION HCL ER (SR) 150 MG PO TB12
150.0000 mg | ORAL_TABLET | Freq: Every day | ORAL | Status: DC
  Administered 2020-07-08 – 2020-07-12 (×5): 150 mg via ORAL
  Filled 2020-07-08 (×5): qty 1

## 2020-07-08 MED ORDER — BUPIVACAINE HCL (PF) 0.25 % IJ SOLN
INTRAMUSCULAR | Status: AC
  Filled 2020-07-08: qty 30

## 2020-07-08 MED ORDER — HYDROMORPHONE HCL 1 MG/ML IJ SOLN
0.5000 mg | INTRAMUSCULAR | Status: DC | PRN
  Administered 2020-07-08 (×2): 0.5 mg via INTRAVENOUS
  Filled 2020-07-08 (×2): qty 1

## 2020-07-08 MED ORDER — LEVOTHYROXINE SODIUM 88 MCG PO TABS
88.0000 ug | ORAL_TABLET | Freq: Every day | ORAL | Status: DC
  Administered 2020-07-09 – 2020-07-12 (×4): 88 ug via ORAL
  Filled 2020-07-08 (×4): qty 1

## 2020-07-08 MED ORDER — ALPRAZOLAM 0.25 MG PO TABS
0.2500 mg | ORAL_TABLET | Freq: Every day | ORAL | Status: DC
  Administered 2020-07-08 – 2020-07-12 (×5): 0.25 mg via ORAL
  Filled 2020-07-08 (×5): qty 1

## 2020-07-08 MED ORDER — HYDROMORPHONE HCL 1 MG/ML IJ SOLN
1.0000 mg | INTRAMUSCULAR | Status: DC | PRN
  Administered 2020-07-08 – 2020-07-11 (×9): 1 mg via INTRAVENOUS
  Filled 2020-07-08 (×10): qty 1

## 2020-07-08 MED ORDER — PANTOPRAZOLE SODIUM 40 MG PO TBEC
40.0000 mg | DELAYED_RELEASE_TABLET | Freq: Every day | ORAL | Status: DC
  Administered 2020-07-08 – 2020-07-12 (×5): 40 mg via ORAL
  Filled 2020-07-08 (×5): qty 1

## 2020-07-08 MED ORDER — AMLODIPINE BESYLATE-VALSARTAN 5-160 MG PO TABS: 1.0000 | ORAL_TABLET | Freq: Every day | ORAL | Status: DC

## 2020-07-08 MED ORDER — IRBESARTAN 150 MG PO TABS
150.0000 mg | ORAL_TABLET | Freq: Every day | ORAL | Status: DC
  Administered 2020-07-08: 150 mg via ORAL
  Filled 2020-07-08: qty 1

## 2020-07-08 MED ORDER — ESCITALOPRAM OXALATE 10 MG PO TABS
10.0000 mg | ORAL_TABLET | Freq: Every day | ORAL | Status: DC
  Administered 2020-07-08 – 2020-07-12 (×5): 10 mg via ORAL
  Filled 2020-07-08 (×5): qty 1

## 2020-07-08 MED ORDER — HYDROMORPHONE HCL 1 MG/ML IJ SOLN
0.5000 mg | Freq: Once | INTRAMUSCULAR | Status: AC
  Administered 2020-07-08: 0.5 mg via INTRAVENOUS
  Filled 2020-07-08: qty 1

## 2020-07-08 MED ORDER — MUPIROCIN 2 % EX OINT
1.0000 "application " | TOPICAL_OINTMENT | Freq: Two times a day (BID) | CUTANEOUS | Status: DC
  Administered 2020-07-08 – 2020-07-12 (×9): 1 via NASAL
  Filled 2020-07-08: qty 22

## 2020-07-08 MED ORDER — SODIUM CHLORIDE 0.9 % IV SOLN: INTRAVENOUS | Status: DC

## 2020-07-08 NOTE — Plan of Care (Signed)
  Problem: Pain Managment: Goal: General experience of comfort will improve Outcome: Progressing   Problem: Safety: Goal: Ability to remain free from injury will improve Outcome: Progressing   Problem: Skin Integrity: Goal: Risk for impaired skin integrity will decrease Outcome: Progressing   

## 2020-07-08 NOTE — Progress Notes (Signed)
Notified Cindy [name] that pt c/o severe pain to Rt hip. States her pain is greater than a 10/10,.moaning and groaning. States "it's the worst pain I have ever had". RN will continue to monitor.

## 2020-07-08 NOTE — Social Work (Signed)
CSW acknowledging consult for HH/DME/SNF placement. Will follow for therapy recommendations needed to best determine disposition/for insurance authorization.   Westley Hummer, MSW, [place] Work

## 2020-07-08 NOTE — Consult Note (Signed)
Reason for Consult: Right hip fracture Referring Physician: [name] is an 86 y.o. female.  HPI: Cindy [name] is an 86 year old patient who sustained a mechanical fall yesterday.  Sustained right hip acetabular fracture which is complex.  CT scan has been performed.  Currently she is resting comfortably.  Denies any other orthopedic complaints.  Does not report history of osteoporosis or much arthritis in any of her joints.  Past Medical History:  Diagnosis Date  . Arthritis   . Cancer ([place])    skin  . Colon cancer ([place]) 08/31/13   invasive squamous cell  . Deaf, left   . Depression   . GERD (gastroesophageal reflux disease)   . History of radiation therapy 10/10/13-11/23/13   anal canal 54GY/  . Hyperlipemia   . Hypertension   . Hypothyroidism   . PONV (postoperative nausea and vomiting)   . Skin cancer    Shingles 2012 Bad case    Past Surgical History:  Procedure Laterality Date  . ABDOMINAL HYSTERECTOMY    . APPENDECTOMY    . CESAREAN SECTION     2  . CHOLECYSTECTOMY    . COLONOSCOPY  08/31/13   invasive squamous cell colon  . ERCP    . ESOPHAGOGASTRODUODENOSCOPY  10/20/2012   Procedure: ESOPHAGOGASTRODUODENOSCOPY (EGD);  Surgeon: Arta Silence, MD;  Location: [facility];  Service: Endoscopy;  Laterality: Left;  . EYE SURGERY     cataracts  . KNEE ARTHROSCOPY  05/11/2012   Procedure: ARTHROSCOPY KNEE;  Surgeon: Hessie Dibble, MD;  Location: [facility];  Service: Orthopedics;  Laterality: Right;  left knee medial menisectomy and chondroplasty  . THYROIDECTOMY, PARTIAL    . TONSILLECTOMY      Family History  Problem Relation Age of Onset  . Kidney disease Mother   . Heart attack Father   . Heart disease Father   . Cancer Sister        breast  . Cancer Sister        ovarian  . Cancer Sister        melanoma  . Cancer Daughter        breast    Social History:  reports that she has never smoked. She has never used smokeless  tobacco. She reports that she does not drink alcohol and does not use drugs.  Allergies:  Allergies  Allergen Reactions  . Vancomycin Hives    Zosyn and vanc given together; unclear which was precipitating med. Appears to have tolerated cephalosporins in the past.  . Zosyn [Piperacillin Sod-Tazobactam So] Hives    Zosyn and vanc given together; unclear which was precipitating med. Appears to have tolerated cephalosporins in the past.  . Hydrocodone Nausea Only  . Morphine And Related Hives    Medications: I have reviewed the patient's current medications.  Results for orders placed or performed during the hospital encounter of 07/07/20 (from the past 48 hour(s))  Basic metabolic panel     Status: Abnormal   Collection Time: 07/07/20  6:02 PM  Result Value Ref Range   Sodium 141 135 - 145 mmol/L   Potassium 3.5 3.5 - 5.1 mmol/L   Chloride 109 98 - 111 mmol/L   CO2 19 (L) 22 - 32 mmol/L   Glucose, Bld 123 (H) 70 - 99 mg/dL    Comment: Glucose reference range applies only to samples taken after fasting for at least 8 hours.   BUN 17 8 - 23 mg/dL   Creatinine,  Ser 1.21 (H) 0.44 - 1.00 mg/dL   Calcium 9.3 8.9 - 10.3 mg/dL   GFR calc non Af Amer 40 (L) >60 mL/min   GFR calc Af Amer 46 (L) >60 mL/min   Anion gap 13 5 - 15    Comment: Performed at [facility] [address], [place], [place] [address]  CBC with Differential     Status: Abnormal   Collection Time: 07/07/20  6:02 PM  Result Value Ref Range   WBC 14.4 (H) 4.0 - 10.5 K/uL   RBC 3.72 (L) 3.87 - 5.11 MIL/uL   Hemoglobin 11.8 (L) 12.0 - 15.0 g/dL   HCT 36.6 36 - 46 %   MCV 98.4 80.0 - 100.0 fL   MCH 31.7 26.0 - 34.0 pg   MCHC 32.2 30.0 - 36.0 g/dL   RDW 12.2 11.5 - 15.5 %   Platelets 263 150 - 400 K/uL   nRBC 0.0 0.0 - 0.2 %   Neutrophils Relative % 85 %   Neutro Abs 12.3 (H) 1.7 - 7.7 K/uL   Lymphocytes Relative 9 %   Lymphs Abs 1.3 0.7 - 4.0 K/uL   Monocytes Relative 5 %   Monocytes Absolute 0.7 0 - 1  K/uL   Eosinophils Relative 0 %   Eosinophils Absolute 0.0 0 - 0 K/uL   Basophils Relative 0 %   Basophils Absolute 0.1 0 - 0 K/uL   Immature Granulocytes 1 %   Abs Immature Granulocytes 0.07 0.00 - 0.07 K/uL    Comment: Performed at [facility], [place] [address], [place], [place] [address]  Protime-INR     Status: None   Collection Time: 07/07/20  6:02 PM  Result Value Ref Range   Prothrombin Time 13.3 11.4 - 15.2 seconds   INR 1.1 0.8 - 1.2    Comment: (NOTE) INR goal varies based on device and disease states. Performed at [facility], [place] [address], [place], [place] [address]   SARS Coronavirus 2 by RT PCR (hospital order, performed in [facility] hospital lab) Nasopharyngeal Nasopharyngeal Swab     Status: None   Collection Time: 07/07/20  7:53 PM   Specimen: Nasopharyngeal Swab  Result Value Ref Range   SARS Coronavirus 2 NEGATIVE NEGATIVE    Comment: (NOTE) SARS-CoV-2 target nucleic acids are NOT DETECTED.  The SARS-CoV-2 RNA is generally detectable in upper and lower respiratory specimens during the acute phase of infection. The lowest concentration of SARS-CoV-2 viral copies this assay can detect is 250 copies / mL. A negative result does not preclude SARS-CoV-2 infection and should not be used as the sole basis for treatment or other patient management decisions.  A negative result may occur with improper specimen collection / handling, submission of specimen other than nasopharyngeal swab, presence of viral mutation(s) within the areas targeted by this assay, and inadequate number of viral copies (<250 copies / mL). A negative result must be combined with clinical observations, patient history, and epidemiological information.  Fact Sheet for Patients:   StrictlyIdeas.no  Fact Sheet for Healthcare Providers: BankingDealers.co.za  This test is not yet approved or  cleared by the [place] FDA and has  been authorized for detection and/or diagnosis of SARS-CoV-2 by FDA under an Emergency Use Authorization (EUA).  This EUA will remain in effect (meaning this test can be used) for the duration of the COVID-19 declaration under Section 564(b)(1) of the Act, 21 U.S.C. section 360bbb-3(b)(1), unless the authorization is  terminated or revoked sooner.  Performed at [facility], [place] [address], [place], [place] [address]   Surgical pcr screen     Status: Abnormal   Collection Time: 07/07/20 11:37 PM   Specimen: Nasal Mucosa; Nasal Swab  Result Value Ref Range   MRSA, PCR NEGATIVE NEGATIVE   Staphylococcus aureus POSITIVE (A) NEGATIVE    Comment: (NOTE) The Xpert SA Assay (FDA approved for NASAL specimens in patients 67 years of age and older), is one component of a comprehensive surveillance program. It is not intended to diagnose infection nor to guide or monitor treatment. Performed at [facility], [place] [address] [place], [place] [address]     DG Chest 1 View  Result Date: 07/07/2020 CLINICAL DATA:  Patient fell earlier today while outside at home. EXAM: CHEST  1 VIEW COMPARISON:  Chest radiograph 03/09/2017 FINDINGS: Stable cardiomediastinal contours. Low lung volumes. No focal consolidation. No pneumothorax or large pleural effusion. No acute finding in the visualized skeleton. IMPRESSION: No acute cardiopulmonary finding. Electronically Signed   By: Audie Pinto M.D.   On: 07/07/2020 18:11   CT PELVIS WO CONTRAST  Result Date: 07/07/2020 CLINICAL DATA:  Right pelvic/is tabular fracture. EXAM: CT PELVIS WITHOUT CONTRAST TECHNIQUE: Multidetector CT imaging of the pelvis was performed following the standard protocol without intravenous contrast. COMPARISON:  CT of the abdomen and pelvis June 13, 2003 FINDINGS: Urinary Tract: There is fat stranding surrounding the urinary bladder, but the urinary bladder itself is intact. Bowel:  Unremarkable visualized pelvic bowel  loops. Vascular/Lymphatic: No pathologically enlarged lymph nodes. No significant vascular abnormality seen. Reproductive: No mass or other significant abnormality, post hysterectomy. Other: Fat stranding and small fluid collection within the right pelvis and along the right lower abdominal and pelvic wall. Musculoskeletal: The visualized portion of the right humerus is intact. There is a comminuted triplane displaced anterior and posterior right acetabular fracture. The right humeral head is normally located within the fractured acetabulum. Several fracture lines extend intra-articularly into the right hip joint. The remainder of the pelvic bones are intact. IMPRESSION: 1. Comminuted triplane displaced anterior and posterior right acetabular fracture. Several fracture lines extend intra-articularly into the right hip joint. 2. Fat stranding and small fluid collection within the right pelvis and along the right lower abdominal and pelvic wall. 3. Fat stranding surrounding the urinary bladder, but the urinary bladder itself is intact. Electronically Signed   By: Fidela Salisbury M.D.   On: 07/07/2020 19:39   DG Hip Unilat W or Wo Pelvis 2-3 Views Right  Result Date: 07/07/2020 CLINICAL DATA:  Patient fell earlier today while outside at home. EXAM: DG HIP (WITH OR WITHOUT PELVIS) 2-3V RIGHT COMPARISON:  CT abdomen pelvis 07/06/2017 FINDINGS: Osteopenia. There is disruption of the iliopectineal line consistent with a displaced acetabular fracture. There is associated medial migration of the femoral head. The femoral head and proximal femur appear intact. IMPRESSION: Displaced fracture of the right acetabulum. Recommend CT pelvis for full characterization. Electronically Signed   By: Audie Pinto M.D.   On: 07/07/2020 18:10    Review of Systems  Musculoskeletal: Positive for arthralgias.  All other systems reviewed and are negative.  Blood pressure 134/62, pulse 78, temperature 98.2 F (36.8 C),  temperature source Oral, resp. rate 16, height 5\' 5"  (1.651 m), weight 59 kg, SpO2 96 %. Physical Exam HENT:     Head: Normocephalic.     Nose: Nose normal.     Mouth/Throat:  Mouth: Mucous membranes are moist.  Eyes:     Pupils: Pupils are equal, round, and reactive to light.  Cardiovascular:     Rate and Rhythm: Normal rate.  Pulmonary:     Effort: Pulmonary effort is normal.  Abdominal:     General: Abdomen is flat.  Musculoskeletal:     Cervical back: Normal range of motion.  Skin:    General: Skin is warm.     Capillary Refill: Capillary refill takes less than 2 seconds.  Neurological:     General: No focal deficit present.     Mental Status: She is alert.  Psychiatric:        Mood and Affect: Mood normal.   Ortho exam demonstrates intact ankle dorsiflexion plantarflexion with palpable pedal pulses and good sensation in both feet.  No knee effusions bilaterally.  No pain with range of motion of that left leg.  Bilateral upper extremities have range of motion elbows wrist and shoulder.  Assessment/Plan: Impression is complex acetabular fracture in an 86 year old patient.  Likely will require operative intervention.  I have consulted the orthopedic trauma service.  N.p.o. after midnight tonight.  Cindy [name] 07/08/2020, 5:42 PM

## 2020-07-08 NOTE — Progress Notes (Signed)
Radiographs and CT scanning chart reviewed Complex acetabular fracture present on the right side Will require traction pin today and balance skeletal traction and operative management acutely this week versus delayed hip replacement after fracture healing is occurred. Full consult to follow.

## 2020-07-08 NOTE — Plan of Care (Signed)
°  Problem: Activity: Goal: Ability to ambulate and perform ADLs will improve Outcome: Not Progressing   Problem: Pain Management: Goal: Pain level will decrease Outcome: Progressing   

## 2020-07-08 NOTE — Progress Notes (Signed)
PROGRESS NOTE  Cindy [name]  DOB: Jul 29, 1930  PCP: Josetta Huddle, MD DPO:[id number]  DOA: 07/07/2020  LOS: 1 day   Chief Complaint  Patient presents with  . Fall   Brief narrative: Cindy [name] is a 86 y.o. female with history of hypertension, hypothyroidism, anal cancer in remission. Patient was brought to the ED on 7/17 after a fall in her yard leading to right hip pain  In the ER patient had a CT of the pelvis which shows right-sided acetabular fracture.  Labs are remarkable for WBC count of 14.4 hemoglobin 11.8 creatinine 1.2.  Covid test was negative. Patient was admitted to hospitalist service.  Orthopedics Dr. Marlou Sa was consulted.   Subjective: Patient was seen and examined this morning.  Elderly Caucasian female.  In distress because of pain at the fracture site.  Daughter at bedside.  Assessment/Plan: Right-sided acetabular fracture -After a fall -Orthopedics consulted.  Per orthopedics note, patient will require traction pin today and balance skeletal traction and operative management acutely this week versus delayed hip replacement after fracture healing is occurred.  -Continue pain control.  Acute renal failure -Likely from combination of dehydration and ACE inhibitor.  -Creatinine 1.1 on admission.  Repeat tomorrow.  Start on gentle IV hydration with normal saline at 50 mill per hour.  Hypertension  -Home meds include amlodipine 5 mg daily, valsartan 160 mg daily. Resume both meds.  Continue IV hydralazine for as needed.  Hypothyroidism  -Synthroid 2 resume  Anemia  -Chronic and stable.    Mobility: Needs PT evaluation after surgical fixation Code Status:   Code Status: Full Code  Nutritional status: Body mass index is 21.63 kg/m.     Diet Order            Diet NPO time specified  Diet effective midnight           Diet regular Room service appropriate? Yes; Fluid consistency: Thin  Diet effective now                 DVT  prophylaxis: SCDs Start: 07/07/20 2031   Antimicrobials:  None Fluid: Start normal saline 50 mill per hour  Consultants: Orthopedics Family Communication:  Daughter at bedside  Status is: Inpatient  Remains inpatient appropriate because:Ongoing diagnostic testing needed not appropriate for outpatient work up   [employer]: The patient is from: Home              Anticipated d/c is to: Home vs SNF              Anticipated d/c date is: 3 days              Patient currently is not medically stable to d/c.       Infusions:  . sodium chloride      Scheduled Meds: . Chlorhexidine Gluconate Cloth  6 each Topical Q0600  . [START ON 07/10/2020] levothyroxine  44 mcg Intravenous Daily  . mupirocin ointment  1 application Nasal BID    Antimicrobials: Anti-infectives (From admission, onward)   None      PRN meds: hydrALAZINE, HYDROmorphone (DILAUDID) injection   Objective: Vitals:   07/08/20 0358 07/08/20 0840  BP: (!) 143/63 134/62  Pulse: 75 78  Resp: 15 16  Temp: 98.6 F (37 C) 98.2 F (36.8 C)  SpO2: 100% 96%   No intake or output data in the 24 hours ending 07/08/20 1536 Filed Weights   07/07/20 1642  Weight: 59 kg   Weight change:  Body mass index is 21.63 kg/m.   Physical Exam: General exam: Appears calm and comfortable.  In mild to moderate distress from pain Skin: No rashes, lesions or ulcers. HEENT: Atraumatic, normocephalic, supple neck, no obvious bleeding Lungs: Clear to auscultate bilaterally CVS: Regular rate and rhythm, no murmur GI/Abd soft, nontender, nondistended, bowel sound present CNS: Alert, awake oriented x3 Psychiatry: Mood appropriate Extremities: No pedal edema, no calf tenderness  Data Review: I have personally reviewed the laboratory data and studies available.  Recent Labs  Lab 07/07/20 1802  WBC 14.4*  NEUTROABS 12.3*  HGB 11.8*  HCT 36.6  MCV 98.4  PLT 263   Recent Labs  Lab 07/07/20 1802  NA 141  K 3.5  CL 109   CO2 19*  GLUCOSE 123*  BUN 17  CREATININE 1.21*  CALCIUM 9.3    Signed, Terrilee Croak, MD [facility] Pager: [phone] (Secure Chat preferred). 07/08/2020

## 2020-07-08 NOTE — Plan of Care (Signed)
  Problem: Activity: Goal: Ability to ambulate and perform ADLs will improve Outcome: Not Progressing   Problem: Pain Management: Goal: Pain level will decrease Outcome: Progressing   Problem: Coping: Goal: Level of anxiety will decrease Outcome: Progressing   Problem: Safety: Goal: Ability to remain free from injury will improve Outcome: Progressing   Problem: Skin Integrity: Goal: Risk for impaired skin integrity will decrease Outcome: Progressing

## 2020-07-09 ENCOUNTER — Inpatient Hospital Stay (HOSPITAL_COMMUNITY): Payer: Medicare Other

## 2020-07-09 LAB — CBC WITH DIFFERENTIAL/PLATELET
Abs Immature Granulocytes: 0.05 10*3/uL (ref 0.00–0.07)
Basophils Absolute: 0.1 10*3/uL (ref 0.0–0.1)
Basophils Relative: 1 %
Eosinophils Absolute: 0.1 10*3/uL (ref 0.0–0.5)
Eosinophils Relative: 1 %
HCT: 30.9 % — ABNORMAL LOW (ref 36.0–46.0)
Hemoglobin: 9.9 g/dL — ABNORMAL LOW (ref 12.0–15.0)
Immature Granulocytes: 1 %
Lymphocytes Relative: 17 %
Lymphs Abs: 1.5 10*3/uL (ref 0.7–4.0)
MCH: 31.9 pg (ref 26.0–34.0)
MCHC: 32 g/dL (ref 30.0–36.0)
MCV: 99.7 fL (ref 80.0–100.0)
Monocytes Absolute: 0.9 10*3/uL (ref 0.1–1.0)
Monocytes Relative: 10 %
Neutro Abs: 6.5 10*3/uL (ref 1.7–7.7)
Neutrophils Relative %: 70 %
Platelets: 201 10*3/uL (ref 150–400)
RBC: 3.1 MIL/uL — ABNORMAL LOW (ref 3.87–5.11)
RDW: 12.7 % (ref 11.5–15.5)
WBC: 9.1 10*3/uL (ref 4.0–10.5)
nRBC: 0 % (ref 0.0–0.2)

## 2020-07-09 LAB — BASIC METABOLIC PANEL
Anion gap: 9 (ref 5–15)
BUN: 35 mg/dL — ABNORMAL HIGH (ref 8–23)
CO2: 23 mmol/L (ref 22–32)
Calcium: 8.7 mg/dL — ABNORMAL LOW (ref 8.9–10.3)
Chloride: 103 mmol/L (ref 98–111)
Creatinine, Ser: 1.87 mg/dL — ABNORMAL HIGH (ref 0.44–1.00)
GFR calc Af Amer: 27 mL/min — ABNORMAL LOW (ref >60)
GFR calc non Af Amer: 24 mL/min — ABNORMAL LOW (ref >60)
Glucose, Bld: 141 mg/dL — ABNORMAL HIGH (ref 70–99)
Potassium: 3.9 mmol/L (ref 3.5–5.1)
Sodium: 135 mmol/L (ref 135–145)

## 2020-07-09 MED ORDER — ENSURE ENLIVE PO LIQD
237.0000 mL | Freq: Two times a day (BID) | ORAL | Status: DC
  Administered 2020-07-09 – 2020-07-11 (×3): 237 mL via ORAL

## 2020-07-09 MED ORDER — ENOXAPARIN SODIUM 30 MG/0.3ML ~~LOC~~ SOLN
30.0000 mg | SUBCUTANEOUS | Status: DC
  Administered 2020-07-09: 30 mg via SUBCUTANEOUS
  Filled 2020-07-09: qty 0.3

## 2020-07-09 MED ORDER — ACETAMINOPHEN 500 MG PO TABS
1000.0000 mg | ORAL_TABLET | Freq: Three times a day (TID) | ORAL | Status: DC
  Administered 2020-07-09 – 2020-07-10 (×3): 1000 mg via ORAL
  Filled 2020-07-09 (×3): qty 2

## 2020-07-09 MED ORDER — TRAMADOL HCL 50 MG PO TABS
50.0000 mg | ORAL_TABLET | Freq: Two times a day (BID) | ORAL | Status: DC | PRN
  Administered 2020-07-09 – 2020-07-10 (×2): 50 mg via ORAL
  Filled 2020-07-09 (×2): qty 1

## 2020-07-09 MED ORDER — ADULT MULTIVITAMIN W/MINERALS CH
1.0000 | ORAL_TABLET | Freq: Every day | ORAL | Status: DC
  Administered 2020-07-09 – 2020-07-12 (×4): 1 via ORAL
  Filled 2020-07-09 (×4): qty 1

## 2020-07-09 MED ORDER — TRAMADOL HCL 50 MG PO TABS: 50.0000 mg | ORAL_TABLET | Freq: Four times a day (QID) | ORAL | Status: DC | PRN

## 2020-07-09 NOTE — Progress Notes (Signed)
ANTICOAGULATION CONSULT NOTE - Initial Consult  Pharmacy Consult for Lovenox Indication: VTE prophylaxis  Allergies  Allergen Reactions  . Vancomycin Hives    Zosyn and vanc given together; unclear which was precipitating med. Appears to have tolerated cephalosporins in the past.  . Zosyn [Piperacillin Sod-Tazobactam So] Hives    Zosyn and vanc given together; unclear which was precipitating med. Appears to have tolerated cephalosporins in the past.  . Hydrocodone Nausea Only  . Morphine And Related Hives    Patient Measurements: Height: 5\' 5"  (165.1 cm) Weight: 59 kg (130 lb) IBW/kg (Calculated) : 57  Vital Signs: Temp: 98.6 F (37 C) (07/19 0745) Temp Source: Oral (07/19 0745) BP: 131/57 (07/19 0745) Pulse Rate: 85 (07/19 0745)  Labs: Recent Labs    07/07/20 1802 07/09/20 0159  HGB 11.8* 9.9*  HCT 36.6 30.9*  PLT 263 201  LABPROT 13.3  --   INR 1.1  --   CREATININE 1.21* 1.87*    Estimated Creatinine Clearance: 18.7 mL/min (A) (by C-G formula based on SCr of 1.87 mg/dL (H)).   Medical History: Past Medical History:  Diagnosis Date  . Arthritis   . Cancer ([place])    skin  . Colon cancer ([place]) 08/31/13   invasive squamous cell  . Deaf, left   . Depression   . GERD (gastroesophageal reflux disease)   . History of radiation therapy 10/10/13-11/23/13   anal canal 54GY/  . Hyperlipemia   . Hypertension   . Hypothyroidism   . PONV (postoperative nausea and vomiting)   . Skin cancer    Shingles 2012 Bad case    Medications:  No PTA anticoagulation  Assessment: Pharmacy consulted to dose Lovenox for VTE ppx. Dose adjust Lovenox for CrCl 18.7 mL/min (Scr 1.87).   Goal of Therapy:  Monitor platelets by anticoagulation protocol: Yes   Plan:  Lovenox 30mg  SQ daily Monitor Scr, CBC  Rebbeca Paul, PharmD PGY1 Pharmacy Resident 07/09/2020 10:57 AM  Please check [email or web address] for unit-specific pharmacy phone numbers.

## 2020-07-09 NOTE — Plan of Care (Signed)
  Problem: Education: Goal: Verbalization of understanding the information provided (i.e., activity precautions, restrictions, etc) will improve Outcome: Progressing Goal: Individualized Educational Video(s) Outcome: Progressing   Problem: Pain Management: Goal: Pain level will decrease Outcome: Progressing   

## 2020-07-09 NOTE — Progress Notes (Signed)
PROGRESS NOTE  Cindy [name]  DOB: 07/12/1930  PCP: Josetta Huddle, MD IEP:[id number]  DOA: 07/07/2020  LOS: 2 days   Chief Complaint  Patient presents with  . Fall   Brief narrative: Cindy [name] is a 86 y.o. female with history of hypertension, hypothyroidism, anal cancer in remission. Patient was brought to the ED on 7/17 after a fall in her yard leading to right hip pain  In the ER patient had a CT of the pelvis which shows right-sided acetabular fracture.  Labs are remarkable for WBC count of 14.4 hemoglobin 11.8 creatinine 1.2.  Covid test was negative. Patient was admitted to hospitalist service.  Orthopedics Dr. Marlou Sa was consulted.   Subjective: Patient was seen and examined this morning.   Patient has episodes of uncontrolled pain.  Daughter at bedside. Seen by orthopedics this morning.  Nonoperative management planned. Labs from this morning showed creatinine up to 1.87.  Assessment/Plan: Right-sided acetabular fracture -After a fall -Orthopedics consult appreciated today.  Based on patient's advanced age, poor bone quality and limited ability to achieve and maintain anatomic reduction, nonoperative management is recommended.  -PT to eval the patient.  Pain medicines being adjusted.  Patient will be touchdown weightbearing on the right leg for 8 weeks and then she will need to be bed to chair for 6 to 8 weeks.   -On Lovenox for DVT prophylaxis.  Acute renal failure -Likely from combination of dehydration and ACE inhibitor.  -Creatinine 1.1 on admission.  Despite gentle IV hydration, creatinine is elevated to 1.8 cm.  Increase normal saline from 50 mill per hour 200 mill per hour.  Valsartan on hold  Hypertension  -Home meds include amlodipine 5 mg daily, valsartan 160 mg daily.  Amlodipine resumed.  Valsartan on hold because of AKI.  IV hydralazine as needed.  Hypothyroidism  -resume Synthroid  Anemia  -Chronic and stable.    Acute respiratory  failure with hypoxia -Not on supplemental oxygen at home.  Currently requiring 2 L/min.  No fever, no WBC count.  No cough or other respiratory symptoms.  Likely atelectasis due to immobility related to oxygen dependence. -Incentive spirometry ordered.   Mobility: Needs PT evaluation Code Status:   Code Status: Full Code  Nutritional status: Body mass index is 21.63 kg/m.     Diet Order            Diet regular Room service appropriate? Yes; Fluid consistency: Thin  Diet effective now                 DVT prophylaxis: enoxaparin (LOVENOX) injection 30 mg Start: 07/09/20 1200 SCDs Start: 07/07/20 2031   Antimicrobials:  None Fluid: Normal saline at 100 mL per hr  Consultants: Orthopedics Family Communication:  Daughter at bedside  Status is: Inpatient  Remains inpatient appropriate because:Ongoing diagnostic testing needed not appropriate for outpatient work up   Dispo: The patient is from: Home              Anticipated d/c is to: Home vs SNF              Anticipated d/c date is: 3 days              Patient currently is not medically stable to d/c.  Infusions:  . sodium chloride 100 mL/hr at 07/09/20 0845    Scheduled Meds: . acetaminophen  1,000 mg Oral Q8H  . ALPRAZolam  0.25 mg Oral Daily  . amLODipine  5 mg Oral Daily  .  buPROPion  150 mg Oral Daily  . Chlorhexidine Gluconate Cloth  6 each Topical Q0600  . enoxaparin (LOVENOX) injection  30 mg Subcutaneous Q24H  . escitalopram  10 mg Oral Daily  . levothyroxine  88 mcg Oral QAC breakfast  . mupirocin ointment  1 application Nasal BID  . pantoprazole  40 mg Oral Daily    Antimicrobials: Anti-infectives (From admission, onward)   None      PRN meds: hydrALAZINE, HYDROmorphone (DILAUDID) injection, traMADol   Objective: Vitals:   07/09/20 0311 07/09/20 0745  BP: (!) 130/58 (!) 131/57  Pulse: 86 85  Resp: 16 17  Temp: 99.1 F (37.3 C) 98.6 F (37 C)  SpO2: 99% 98%    Intake/Output Summary  (Last 24 hours) at 07/09/2020 1347 Last data filed at 07/08/2020 1855 Gross per 24 hour  Intake 835 ml  Output 600 ml  Net 235 ml   Filed Weights   07/07/20 1642  Weight: 59 kg   Weight change:  Body mass index is 21.63 kg/m.   Physical Exam: General exam: Appears calm and comfortable.  In mild distress from pain. Skin: No rashes, lesions or ulcers. HEENT: Atraumatic, normocephalic, supple neck, no obvious bleeding Lungs: Clear to auscultation bilaterally CVS: Regular rate and rhythm, no murmur GI/Abd soft, nontender, nondistended, bowel sound present CNS: Alert, awake oriented x3 Psychiatry: Mood appropriate Extremities: No pedal edema, no calf tenderness  Data Review: I have personally reviewed the laboratory data and studies available.  Recent Labs  Lab 07/07/20 1802 07/09/20 0159  WBC 14.4* 9.1  NEUTROABS 12.3* 6.5  HGB 11.8* 9.9*  HCT 36.6 30.9*  MCV 98.4 99.7  PLT 263 201   Recent Labs  Lab 07/07/20 1802 07/09/20 0159  NA 141 135  K 3.5 3.9  CL 109 103  CO2 19* 23  GLUCOSE 123* 141*  BUN 17 35*  CREATININE 1.21* 1.87*  CALCIUM 9.3 8.7*    Signed, Terrilee Croak, MD [facility] Pager: [phone] (Secure Chat preferred). 07/09/2020

## 2020-07-09 NOTE — Progress Notes (Signed)
PT Cancellation Note  Patient Details Name: Cindy [name] MRN: [id number] DOB: 1930-04-25   Cancelled Treatment:    Reason Eval/Treat Not Completed: Other (comment);Pain limiting ability to participate (pain) RN requests that PT hold off for now/return later- patient with great deal of pain and just receiving pain meds now. Will attempt to return if time/schedule allow.    Windell Norfolk, DPT, PN1   Supplemental Physical Therapist [facility]    Pager [phone] Acute Rehab Office [phone]

## 2020-07-09 NOTE — Consult Note (Signed)
Orthopaedic Trauma Service (OTS) Consult   Patient ID: Cindy [name] MRN: [id number] DOB/AGE: [birth year] 86 y.o.   Reason for Consult: Complex right acetabulum fracture Referring Physician: Anderson Malta, MD (ortho)   HPI: Cindy [name] is an 86 y.o. female who sustained a low-energy fall at home on 07/07/2020.  Patient was outside in the backyard when she tripped and fell directly on her right hip.  Patient had immediate onset of pain inability to get up and bear weight.  EMS was called and transported her to the hospital.  She was seen and evaluated at [facility].  She was found to have a complex right acetabular fracture.  Patient was seen and evaluated by Dr. Marlou Sa in the emergency room.  Dr. Marlou Sa asserted that this particular injury was outside the scope of his practice and requested further evaluation by the orthopedic trauma service.  Due to her medical comorbidities patient was admitted to the medical service.  She has been on bedrest since admission.  She is currently complaining of right hip pain but again she is quite stiff from having been laying in a recumbent position since 7/17.  Patient seen and evaluated by the orthopedic trauma service on 07/09/2020.  She is accompanied by family at bedside.  She reports again pain isolated to her right hip.  Denies any numbness or tingling in her lower extremities.  No other injuries noted elsewhere.  Pain is deep aching in nature.  Pain is exacerbated with movement and is relieved with rest.  She has been receiving only Dilaudid IV for pain control and this is making her a little somnolent although it is controlling her pain.  Per family report patient does have a cane and a walker which she should use but often does not use.  She lives alone at home.  She does have a caretaker that provide care for her during the day.  Minimal activity  Patient has had a DEXA scan in the past but does not recall the last time she had one.   She only takes vitamin D in terms of her bone health.  No other medications for bone health.  Uncertain as to what the results were of her last DEXA   History of skin cancer and colon cancer, s/p radiation therapy  No reported history of CKD however she does appear to have some acute renal insufficiency.  Medicine service is following  Past Medical History:  Diagnosis Date  . Arthritis   . Cancer ([place])    skin  . 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 Deaf, left   . Depression   . GERD (gastroesophageal reflux disease)   . History of radiation therapy 10/10/13-11/23/13   anal canal 54GY/  . Hyperlipemia   . Hypertension   . Hypothyroidism   . PONV (postoperative nausea and vomiting)   . Skin cancer    Shingles 2012 Bad case    Past Surgical History:  Procedure Laterality Date  . ABDOMINAL HYSTERECTOMY    . APPENDECTOMY    . CESAREAN SECTION     2  . CHOLECYSTECTOMY    . COLONOSCOPY  08/31/13   invasive squamous cell colon  . ERCP    . ESOPHAGOGASTRODUODENOSCOPY  10/20/2012   Procedure: ESOPHAGOGASTRODUODENOSCOPY (EGD);  Surgeon: Arta Silence, MD;  Location: [facility];  Service: Endoscopy;  Laterality: Left;  . EYE SURGERY     cataracts  . KNEE ARTHROSCOPY  05/11/2012   Procedure: ARTHROSCOPY KNEE;  Surgeon: Hessie Dibble, MD;  Location: [facility];  Service: Orthopedics;  Laterality: Right;  left knee medial menisectomy and chondroplasty  . THYROIDECTOMY, PARTIAL    . TONSILLECTOMY      Family History  Problem Relation Age of Onset  . Kidney disease Mother   . Heart attack Father   . Heart disease Father   . Cancer Sister        breast  . Cancer Sister        ovarian  . Cancer Sister        melanoma  . Cancer Daughter        breast    Social History:  reports that she has never smoked. She has never used smokeless tobacco. She reports that she does not drink alcohol and does not use drugs.  Allergies:  Allergies   Allergen Reactions  . Vancomycin Hives    Zosyn and vanc given together; unclear which was precipitating med. Appears to have tolerated cephalosporins in the past.  . Zosyn [Piperacillin Sod-Tazobactam So] Hives    Zosyn and vanc given together; unclear which was precipitating med. Appears to have tolerated cephalosporins in the past.  . Hydrocodone Nausea Only  . Morphine And Related Hives    Medications: I have reviewed the patient's current medications. Current Meds  Medication Sig  . ALPRAZolam (XANAX) 0.5 MG tablet Take 0.25 mg by mouth daily.  Marland Kitchen amLODipine-valsartan (EXFORGE) 5-160 MG tablet Take 1 tablet by mouth daily.   Marland Kitchen buPROPion (WELLBUTRIN SR) 150 MG 12 hr tablet Take 150 mg by mouth daily.   Marland Kitchen escitalopram (LEXAPRO) 10 MG tablet Take 10 mg by mouth daily.  Marland Kitchen ibuprofen (ADVIL) 200 MG tablet Take 400 mg by mouth at bedtime as needed for fever or moderate pain.  Marland Kitchen levothyroxine (SYNTHROID, LEVOTHROID) 88 MCG tablet Take 88 mcg by mouth daily before breakfast.  . ondansetron (ZOFRAN ODT) 4 MG disintegrating tablet Take 1 tablet (4 mg total) by mouth every 8 (eight) hours as needed for nausea or vomiting.  . pantoprazole (PROTONIX) 40 MG tablet Take 40 mg by mouth daily.  Marland Kitchen PREMARIN 0.3 MG tablet Take 0.3 mg by mouth daily.     Results for orders placed or performed during the hospital encounter of 07/07/20 (from the past 48 hour(s))  Basic metabolic panel     Status: Abnormal   Collection Time: 07/07/20  6:02 PM  Result Value Ref Range   Sodium 141 135 - 145 mmol/L   Potassium 3.5 3.5 - 5.1 mmol/L   Chloride 109 98 - 111 mmol/L   CO2 19 (L) 22 - 32 mmol/L   Glucose, Bld 123 (H) 70 - 99 mg/dL    Comment: Glucose reference range applies only to samples taken after fasting for at least 8 hours.   BUN 17 8 - 23 mg/dL   Creatinine, Ser 1.21 (H) 0.44 - 1.00 mg/dL   Calcium 9.3 8.9 - 10.3 mg/dL   GFR calc non Af Amer 40 (L) >60 mL/min   GFR calc Af Amer 46 (L) >60 mL/min    Anion gap 13 5 - 15    Comment: Performed at [facility] [address], [place], [place] [address]  CBC with Differential     Status: Abnormal   Collection Time: 07/07/20  6:02 PM  Result Value Ref Range   WBC 14.4 (H) 4.0 - 10.5 K/uL   RBC 3.72 (L)  3.87 - 5.11 MIL/uL   Hemoglobin 11.8 (L) 12.0 - 15.0 g/dL   HCT 36.6 36 - 46 %   MCV 98.4 80.0 - 100.0 fL   MCH 31.7 26.0 - 34.0 pg   MCHC 32.2 30.0 - 36.0 g/dL   RDW 12.2 11.5 - 15.5 %   Platelets 263 150 - 400 K/uL   nRBC 0.0 0.0 - 0.2 %   Neutrophils Relative % 85 %   Neutro Abs 12.3 (H) 1.7 - 7.7 K/uL   Lymphocytes Relative 9 %   Lymphs Abs 1.3 0.7 - 4.0 K/uL   Monocytes Relative 5 %   Monocytes Absolute 0.7 0 - 1 K/uL   Eosinophils Relative 0 %   Eosinophils Absolute 0.0 0 - 0 K/uL   Basophils Relative 0 %   Basophils Absolute 0.1 0 - 0 K/uL   Immature Granulocytes 1 %   Abs Immature Granulocytes 0.07 0.00 - 0.07 K/uL    Comment: Performed at [facility] [address] [place], [place] [address]  Protime-INR     Status: None   Collection Time: 07/07/20  6:02 PM  Result Value Ref Range   Prothrombin Time 13.3 11.4 - 15.2 seconds   INR 1.1 0.8 - 1.2    Comment: (NOTE) INR goal varies based on device and disease states. Performed at [facility], [place] [address] [place], [place] [address]   SARS Coronavirus 2 by RT PCR (hospital order, performed in [facility] hospital lab) Nasopharyngeal Nasopharyngeal Swab     Status: None   Collection Time: 07/07/20  7:53 PM   Specimen: Nasopharyngeal Swab  Result Value Ref Range   SARS Coronavirus 2 NEGATIVE NEGATIVE    Comment: (NOTE) SARS-CoV-2 target nucleic acids are NOT DETECTED.  The SARS-CoV-2 RNA is generally detectable in upper and lower respiratory specimens during the acute phase of infection. The lowest concentration of SARS-CoV-2 viral copies this assay can detect is 250 copies / mL. A negative result does not preclude SARS-CoV-2 infection and  should not be used as the sole basis for treatment or other patient management decisions.  A negative result may occur with improper specimen collection / handling, submission of specimen other than nasopharyngeal swab, presence of viral mutation(s) within the areas targeted by this assay, and inadequate number of viral copies (<250 copies / mL). A negative result must be combined with clinical observations, patient history, and epidemiological information.  Fact Sheet for Patients:   StrictlyIdeas.no  Fact Sheet for Healthcare Providers: BankingDealers.co.za  This test is not yet approved or  cleared by the [place] FDA and has been authorized for detection and/or diagnosis of SARS-CoV-2 by FDA under an Emergency Use Authorization (EUA).  This EUA will remain in effect (meaning this test can be used) for the duration of the COVID-19 declaration under Section 564(b)(1) of the Act, 21 U.S.C. section 360bbb-3(b)(1), unless the authorization is terminated or revoked sooner.  Performed at [facility], [place] [address] [place], [place] [address]   Surgical pcr screen     Status: Abnormal   Collection Time: 07/07/20 11:37 PM   Specimen: Nasal Mucosa; Nasal Swab  Result Value Ref Range   MRSA, PCR NEGATIVE NEGATIVE   Staphylococcus aureus POSITIVE (A) NEGATIVE    Comment: (NOTE) The Xpert SA Assay (FDA approved for NASAL specimens in patients 29 years of age and older), is one component of a comprehensive surveillance program. It is not intended to diagnose infection nor  to guide or monitor treatment. Performed at [facility], [place] [address] [place], [place] [address]   Basic metabolic panel     Status: Abnormal   Collection Time: 07/09/20  1:59 AM  Result Value Ref Range   Sodium 135 135 - 145 mmol/L   Potassium 3.9 3.5 - 5.1 mmol/L   Chloride 103 98 - 111 mmol/L   CO2 23 22 - 32 mmol/L   Glucose, Bld 141 (H) 70  - 99 mg/dL    Comment: Glucose reference range applies only to samples taken after fasting for at least 8 hours.   BUN 35 (H) 8 - 23 mg/dL   Creatinine, Ser 1.87 (H) 0.44 - 1.00 mg/dL   Calcium 8.7 (L) 8.9 - 10.3 mg/dL   GFR calc non Af Amer 24 (L) >60 mL/min   GFR calc Af Amer 27 (L) >60 mL/min   Anion gap 9 5 - 15    Comment: Performed at [facility] [address], [place], [place] [address]  CBC with Differential/Platelet     Status: Abnormal   Collection Time: 07/09/20  1:59 AM  Result Value Ref Range   WBC 9.1 4.0 - 10.5 K/uL   RBC 3.10 (L) 3.87 - 5.11 MIL/uL   Hemoglobin 9.9 (L) 12.0 - 15.0 g/dL   HCT 30.9 (L) 36 - 46 %   MCV 99.7 80.0 - 100.0 fL   MCH 31.9 26.0 - 34.0 pg   MCHC 32.0 30.0 - 36.0 g/dL   RDW 12.7 11.5 - 15.5 %   Platelets 201 150 - 400 K/uL    Comment: REPEATED TO VERIFY PLATELET COUNT CONFIRMED BY SMEAR SPECIMEN CHECKED FOR CLOTS    nRBC 0.0 0.0 - 0.2 %   Neutrophils Relative % 70 %   Neutro Abs 6.5 1.7 - 7.7 K/uL   Lymphocytes Relative 17 %   Lymphs Abs 1.5 0.7 - 4.0 K/uL   Monocytes Relative 10 %   Monocytes Absolute 0.9 0 - 1 K/uL   Eosinophils Relative 1 %   Eosinophils Absolute 0.1 0 - 0 K/uL   Basophils Relative 1 %   Basophils Absolute 0.1 0 - 0 K/uL   Immature Granulocytes 1 %   Abs Immature Granulocytes 0.05 0.00 - 0.07 K/uL    Comment: Performed at [facility], [place] [address], [place], [place] [address]    DG Chest 1 View  Result Date: 07/07/2020 CLINICAL DATA:  Patient fell earlier today while outside at home. EXAM: CHEST  1 VIEW COMPARISON:  Chest radiograph 03/09/2017 FINDINGS: Stable cardiomediastinal contours. Low lung volumes. No focal consolidation. No pneumothorax or large pleural effusion. No acute finding in the visualized skeleton. IMPRESSION: No acute cardiopulmonary finding. Electronically Signed   By: Audie Pinto M.D.   On: 07/07/2020 18:11   CT PELVIS WO CONTRAST  Result Date: 07/07/2020 CLINICAL DATA:   Right pelvic/is tabular fracture. EXAM: CT PELVIS WITHOUT CONTRAST TECHNIQUE: Multidetector CT imaging of the pelvis was performed following the standard protocol without intravenous contrast. COMPARISON:  CT of the abdomen and pelvis June 13, 2003 FINDINGS: Urinary Tract: There is fat stranding surrounding the urinary bladder, but the urinary bladder itself is intact. Bowel:  Unremarkable visualized pelvic bowel loops. Vascular/Lymphatic: No pathologically enlarged lymph nodes. No significant vascular abnormality seen. Reproductive: No mass or other significant abnormality, post hysterectomy. Other: Fat stranding and small fluid collection within the right pelvis and along the right lower abdominal and pelvic wall. Musculoskeletal:  The visualized portion of the right humerus is intact. There is a comminuted triplane displaced anterior and posterior right acetabular fracture. The right humeral head is normally located within the fractured acetabulum. Several fracture lines extend intra-articularly into the right hip joint. The remainder of the pelvic bones are intact. IMPRESSION: 1. Comminuted triplane displaced anterior and posterior right acetabular fracture. Several fracture lines extend intra-articularly into the right hip joint. 2. Fat stranding and small fluid collection within the right pelvis and along the right lower abdominal and pelvic wall. 3. Fat stranding surrounding the urinary bladder, but the urinary bladder itself is intact. Electronically Signed   By: Fidela Salisbury M.D.   On: 07/07/2020 19:39   DG Hip Unilat W or Wo Pelvis 2-3 Views Right  Result Date: 07/07/2020 CLINICAL DATA:  Patient fell earlier today while outside at home. EXAM: DG HIP (WITH OR WITHOUT PELVIS) 2-3V RIGHT COMPARISON:  CT abdomen pelvis 07/06/2017 FINDINGS: Osteopenia. There is disruption of the iliopectineal line consistent with a displaced acetabular fracture. There is associated medial migration of the femoral  head. The femoral head and proximal femur appear intact. IMPRESSION: Displaced fracture of the right acetabulum. Recommend CT pelvis for full characterization. Electronically Signed   By: Audie Pinto M.D.   On: 07/07/2020 18:10    Review of Systems  Constitutional: Negative for chills and fever.  Respiratory: Negative for shortness of breath.   Cardiovascular: Negative for chest pain and palpitations.  Gastrointestinal: Negative for abdominal pain, nausea and vomiting.  Musculoskeletal:       Right hip pain  Neurological: Negative for tingling and sensory change.   Blood pressure (!) 131/57, pulse 85, temperature 98.6 F (37 C), temperature source Oral, resp. rate 17, height 5\' 5"  (1.651 m), weight 59 kg, SpO2 98 %. Physical Exam Vitals and nursing note reviewed.  Constitutional:      General: She is not in acute distress.    Appearance: She is well-developed and well-groomed.  Cardiovascular:     Rate and Rhythm: Normal rate and regular rhythm.     Heart sounds: S1 normal and S2 normal.  Pulmonary:     Effort: No accessory muscle usage or respiratory distress.  Abdominal:     Comments: + BS, NTND  Musculoskeletal:     Comments: Pelvis and R LEx   Pelvis is nontender   No instability with manipulation of pelvis Right leg does appear to be resting in a symmetric position to the contralateral side Leg lengths appear to be appropriate at this time    No significant swelling noted to the right leg    Mild pain with gentle axial loading of her right hip as well as logrolling    No open wounds or lesions noted    Good color to her skin noted.  Good perfusion distally    + DP pulse    No deep calf tenderness    Compartments are soft    Knee and ankle are nontender.  No effusions appreciated    No acute findings noted to the lower leg, ankle or foot    No crepitus or instability noted with evaluation of the lower leg ankle or foot    Thighs nontender     Knee and ankle are  grossly stable with evaluation     DPN, SPN, TN sensory functions grossly intact    EHL, FHL, lesser toe motor function grossly intact.  Ankle flexion, extension, inversion and eversion are grossly intact  Left lower  extremity             no open wounds or lesions, no swelling or ecchymosis   Nontender hip, knee, ankle and foot             No crepitus or gross motion noted with manipulation of the L leg  No knee or ankle effusion             No pain with axial loading or logrolling of the hip.  Knee stable to varus/ valgus and anterior/posterior stress             No pain with manipulation of the ankle or foot             No blocks to motion noted  Sens DPN, SPN, TN intact  Motor EHL, FHL, lesser toe motor, Ext, flex, evers 5/5  DP 2+, No significant edema             Compartments are soft and nontender, no pain with passive stretching   Skin:    General: Skin is warm.  Neurological:     Mental Status: She is easily aroused. She is lethargic.     Comments: Did not assess station or gait   Psychiatric:        Behavior: Behavior is cooperative.     CT pelvis      Comminuted Right anterior column posterior hemitransverse acetabulum fracture.  R hip joint does actually appear to maintain an acceptable level of congruency. B joint space narrowing   Assessment/Plan:  86 year old female ground-level fall with complex right acetabulum fracture   - ground level fall   - Right anterior column posterior hemitransverse acetabulum fracture              Reviewed with pt and family              Discussed that at pts age and her mechanism (which is suggestive of poor bone quality) that poor bone quality limits the ability to achieve and maintain an anatomic reduction.  there is marginal impaction of the joint which would be very difficult to correct and maintain. The fact that she has a daily caregiver and uses or should use a walker/cane is suggestive of lower physical demand              Given current overall alignment we feel it is reasonable to proceed with non-operative management.   If she has symptoms after she has healed her fracture then THA can be considered                We did discuss that surgery is rather invasive with moderate periop risks               Surgery would not change WB status              She will be TDWB R leg x 8 weeks and will need to be bed to chair x 6-8 weeks.  Do not think she would be safe to mobilize with a walker TDWB on R leg for distance                Pt will need SNF at discharge                Check baseline AP and Judet views prior to working with therapy    [email or web address]     - pain management  Added scheduled tylenol              PRN ultram              Limit IV dilaudid                          Use IV only after all po meds given                Ice q2h R hip                Pt also on several neurotropic meds.  Will try to minimize any other additions.   Maximize non-narcotic meds.  No NSAIDs due to acute renal injury    - metabolic bone disease             Fracture = low energy which is suggestive of osteoporosis             Check vitamin d                           Will need repeat dexa                          Would like to be aggressive with her bone health                                      Likely start forteo or tymlos once discharged     Favor tymlos over forteo     Monitor renal function    - DVT/PE prophylaxis              Lovenox    - FEN             Ok to eat today    - Dispo             Therapy evals             Maximize pain control                 Jari Pigg, PA-C [phone] (C) 07/09/2020, 12:23 PM  [facility] [place] [address] [phone] Domingo Sep (F)

## 2020-07-09 NOTE — Progress Notes (Signed)
Initial Nutrition Assessment  DOCUMENTATION CODES:   Not applicable  INTERVENTION:   -MVI with minerals daily -Ensure Enlive po BID, each supplement provides 350 kcal and 20 grams of protein  NUTRITION DIAGNOSIS:   Increased nutrient needs related to acute illness as evidenced by estimated needs.  GOAL:   Patient will meet greater than or equal to 90% of their needs  MONITOR:   PO intake, Supplement acceptance, Labs, Weight trends, Skin, I & O's  REASON FOR ASSESSMENT:   Consult Assessment of nutrition requirement/status  ASSESSMENT:   Cindy [name] is a 86 y.o. female with history of hypertension, hypothyroidism, anal cancer in remission who was brought to the ER after patient had a fall at home.  Patient states she was in the ER when she tripped and fell and hit her back.  Denies hitting her head or losing consciousness.  Pt admitted with rt sided acetabular fracture.   Reviewed I/O's: +235 ml x 24 hours  UOP: 600 ml x 24 hours  Spoke with pt and daughter at bedside. Pt reports she had a good appetite PTA- she consumes 2 meals per day (Breakfast: cereal, Dinner: meat, starc, and vegetable). Pt snacks throughout the day on crackers and fruit. She prepares her own meals, but children assist with grocery shopping ("they make sure my fridge is stocked").   Observed breakfast meal tray- she consumed about 75% of tray. Meal completions documented at 50%.   Pt denies any weight loss.   Per pt daughter, they decided not to pursue surgery. Pt has not worked with therapy yet, but looking forward to it. Discussed importance of goof meal and supplement intake to promote healing.   Medications reviewed and include 0.9% sodium chloride infusion @ 50 ml/hr.   Labs reviewed.   NUTRITION - FOCUSED PHYSICAL EXAM:    Most Recent Value  Orbital Region No depletion  Upper Arm Region No depletion  Thoracic and Lumbar Region No depletion  Buccal Region No depletion  Temple  Region No depletion  Clavicle Bone Region No depletion  Clavicle and Acromion Bone Region No depletion  Scapular Bone Region No depletion  Dorsal Hand No depletion  Patellar Region No depletion  Anterior Thigh Region No depletion  Posterior Calf Region No depletion  Edema (RD Assessment) None  Hair Reviewed  Eyes Reviewed  Mouth Reviewed  Skin Reviewed  Nails Reviewed       Diet Order:   Diet Order            Diet regular Room service appropriate? Yes; Fluid consistency: Thin  Diet effective now                 EDUCATION NEEDS:   Education needs have been addressed  Skin:  Skin Assessment: Reviewed RN Assessment  Last BM:  07/07/20  Height:   Ht Readings from Last 1 Encounters:  07/07/20 5\' 5"  (1.651 m)    Weight:   Wt Readings from Last 1 Encounters:  07/07/20 59 kg    Ideal Body Weight:  56.8 kg  BMI:  Body mass index is 21.63 kg/m.  Estimated Nutritional Needs:   Kcal:  1500-1700  Protein:  70-85 grams  Fluid:  > 1.5 L   Loistine Chance, RD, LDN, [place] Registered Dietitian II Certified Diabetes Care and Education Specialist Please refer to [facility] for RD and/or RD on-call/weekend/after hours pager

## 2020-07-09 NOTE — Consult Note (Signed)
   [facility] CM Inpatient Consult   07/09/2020  Cindy [name] 10/07/1930 [id number]   [place] [employer] [ACO] Patient: Medicare NextGen   Patient screened for showing as active with in [facility] for services.  Review of patient's medical record reveals patient is no longer active with [facility] Management team.  Will reach out to team member and have [facility] status program updated.  Primary Care Provider is Josetta Huddle and this provider is listed to provide the transition of care [TOC] for post hospital follow up. Patient's current disposition is not known.  Plan:  Continue to follow progress and disposition to assess for post hospital care management needs.   F or questions contact:   Natividad Brood, RN BSN [facility] Liaison  [phone] business mobile phone Toll free office [phone]  Fax number: [phone] [email or web address]

## 2020-07-09 NOTE — Progress Notes (Addendum)
Ortho Trauma Service   Pt seen and evaluated Quite somnolent due to pain meds   Lives alone in a house but has a caregiver from 9-3.  Has a walker and cane and probably should use them but doesn't Unable to recall last dexa Takes vitamin d but no other bone health meds   Has not been out of bed since admission   R LEx exam     Motor and sensory functions intact    No asymmetric swelling     Ext warm     + DP pulse     + pain with axial load    CT scan pelvis     Comminuted Right anterior column posterior hemitransverse acetabulum fracture.  R hip joint does actually appear to maintain an acceptable level of congruency   A/P  86 y/o female s/p ground level fall (low energy mechanism) with complex R acetabulum fracture  - ground level fall  - Right anterior column posterior hemitransverse acetabulum fracture   Reviewed with pt and family   Discussed that at pts age and her mechanism (suggestive of poor bone quality) that poor bone quality limits the ability to achieve and maintain an anatomic reduction.  The fact that she has a daily caregiver and uses or should use a walker/cane is suggestive of lower physical demand  Given current overall alignment we feel it is reasonable to proceed with non-operative management.   If she has symptoms after she has healed her fracture then THA can be considered    We did discuss that surgery is rather invasive with moderate periop risks   Surgery would not change WB status   She will be TDWB R leg x 8 weeks and will need to be bed to chair x 6-8 weeks.  Do not think she would be safe to mobilize with a walker TDWB on R leg for distance    Pt will need SNF at discharge    Check baseline AP and [place] views prior to working with therapy   [email or web address]   - pain management   Added scheduled tylenol   PRN ultram   Limit IV dilaudid    Use IV  only after all po meds given    Ice q2h R hip    Pt also on several neurotropic meds.  Will try to minimize any additions.   Maximize non-narcotic meds.  No NSAIDs due to acute renal injury   - metabolic bone disease  Fracture = low energy which is suggestive of osteoporosis  Check vitamin d     Will need repeat dexa    Would like to be aggressive with her bone health     Likely start forteo or tymlos once discharged   - DVT/PE prophylaxis   Lovenox   - FEN  Ok to eat today   - Dispo  Therapy evals  Maximize pain control     Jari Pigg, PA-C [phone] (C) 07/09/2020, 10:29 AM  [facility] [place] [id number] [phone] Domingo Sep (F)

## 2020-07-10 ENCOUNTER — Inpatient Hospital Stay (HOSPITAL_COMMUNITY): Payer: Medicare Other

## 2020-07-10 LAB — CBC
HCT: 28.1 % — ABNORMAL LOW (ref 36.0–46.0)
Hemoglobin: 9 g/dL — ABNORMAL LOW (ref 12.0–15.0)
MCH: 32.1 pg (ref 26.0–34.0)
MCHC: 32 g/dL (ref 30.0–36.0)
MCV: 100.4 fL — ABNORMAL HIGH (ref 80.0–100.0)
Platelets: 178 10*3/uL (ref 150–400)
RBC: 2.8 MIL/uL — ABNORMAL LOW (ref 3.87–5.11)
RDW: 12.4 % (ref 11.5–15.5)
WBC: 9.2 10*3/uL (ref 4.0–10.5)
nRBC: 0 % (ref 0.0–0.2)

## 2020-07-10 LAB — PROTEIN / CREATININE RATIO, URINE
Creatinine, Urine: 184.3 mg/dL
Protein Creatinine Ratio: 0.31 mg/mg{Cre} — ABNORMAL HIGH (ref 0.00–0.15)
Total Protein, Urine: 58 mg/dL

## 2020-07-10 LAB — CREATININE, SERUM
Creatinine, Ser: 2.4 mg/dL — ABNORMAL HIGH (ref 0.44–1.00)
GFR calc Af Amer: 20 mL/min — ABNORMAL LOW (ref >60)
GFR calc non Af Amer: 17 mL/min — ABNORMAL LOW (ref >60)

## 2020-07-10 LAB — VITAMIN D 25 HYDROXY (VIT D DEFICIENCY, FRACTURES): Vit D, 25-Hydroxy: 13.76 ng/mL — ABNORMAL LOW (ref 30–100)

## 2020-07-10 LAB — CREATININE, URINE, RANDOM: Creatinine, Urine: 185.04 mg/dL

## 2020-07-10 LAB — SODIUM, URINE, RANDOM: Sodium, Ur: <10 mmol/L

## 2020-07-10 MED ORDER — METHOCARBAMOL 500 MG PO TABS
500.0000 mg | ORAL_TABLET | Freq: Three times a day (TID) | ORAL | Status: DC
  Administered 2020-07-10 – 2020-07-12 (×6): 500 mg via ORAL
  Filled 2020-07-10 (×6): qty 1

## 2020-07-10 MED ORDER — NALOXONE HCL 0.4 MG/ML IJ SOLN: 0.4000 mg | INTRAMUSCULAR | Status: DC | PRN

## 2020-07-10 MED ORDER — FENTANYL 12 MCG/HR TD PT72
1.0000 | MEDICATED_PATCH | TRANSDERMAL | Status: DC
  Administered 2020-07-10: 1 via TRANSDERMAL
  Filled 2020-07-10: qty 1

## 2020-07-10 MED ORDER — ACETAMINOPHEN 500 MG PO TABS
1000.0000 mg | ORAL_TABLET | Freq: Three times a day (TID) | ORAL | Status: DC
  Administered 2020-07-10 – 2020-07-12 (×6): 1000 mg via ORAL
  Filled 2020-07-10 (×6): qty 2

## 2020-07-10 MED ORDER — HYDROMORPHONE HCL 2 MG PO TABS: 2.0000 mg | ORAL_TABLET | Freq: Once | ORAL | Status: DC

## 2020-07-10 MED ORDER — METHOCARBAMOL 500 MG PO TABS
500.0000 mg | ORAL_TABLET | Freq: Four times a day (QID) | ORAL | Status: DC | PRN
  Administered 2020-07-10: 500 mg via ORAL
  Filled 2020-07-10: qty 1

## 2020-07-10 MED ORDER — OXYCODONE-ACETAMINOPHEN 5-325 MG PO TABS
1.0000 | ORAL_TABLET | ORAL | Status: DC | PRN
  Administered 2020-07-10: 1 via ORAL
  Filled 2020-07-10 (×2): qty 1

## 2020-07-10 MED ORDER — HYDROMORPHONE HCL 2 MG PO TABS
2.0000 mg | ORAL_TABLET | ORAL | Status: DC | PRN
  Administered 2020-07-10: 4 mg via ORAL
  Administered 2020-07-11 (×2): 2 mg via ORAL
  Administered 2020-07-11: 4 mg via ORAL
  Administered 2020-07-11 – 2020-07-12 (×5): 2 mg via ORAL
  Administered 2020-07-12: 4 mg via ORAL
  Filled 2020-07-10: qty 2
  Filled 2020-07-10: qty 1
  Filled 2020-07-10 (×2): qty 2
  Filled 2020-07-10: qty 1
  Filled 2020-07-10: qty 2
  Filled 2020-07-10: qty 1
  Filled 2020-07-10 (×2): qty 2
  Filled 2020-07-10: qty 1

## 2020-07-10 MED ORDER — HEPARIN SODIUM (PORCINE) 5000 UNIT/ML IJ SOLN
5000.0000 [IU] | Freq: Three times a day (TID) | INTRAMUSCULAR | Status: DC
  Administered 2020-07-10 – 2020-07-12 (×7): 5000 [IU] via SUBCUTANEOUS
  Filled 2020-07-10 (×7): qty 1

## 2020-07-10 MED ORDER — FENTANYL 25 MCG/HR TD PT72: 1.0000 | MEDICATED_PATCH | TRANSDERMAL | Status: DC

## 2020-07-10 NOTE — Plan of Care (Signed)
  Problem: Education: Goal: Verbalization of understanding the information provided (i.e., activity precautions, restrictions, etc) will improve Outcome: Progressing Goal: Individualized Educational Video(s) Outcome: Progressing   Problem: Pain Management: Goal: Pain level will decrease Outcome: Progressing   

## 2020-07-10 NOTE — Progress Notes (Addendum)
Orthopaedic Trauma Service Progress Note  Patient ID: JAKAIYA NETHERLAND MRN: [id number] DOB/AGE: 1930-11-30 86 y.o.  Subjective:  Doing fair  Pain in R hip along with muscle spasms  Today was the first day she has been upright since Saturday. Therapy notes reviewed. Able to sit on EOB. Needed constant stimulus to stay aroused Family states pt has been crying in pain most of the day some adjustments made this afternoon by medicine team  Started on very low dose fentanyl patch (12 mcg/hr) and percocet. Pt states percocet not helping at all.  IV dilaudid most helpful  Discussed with the family that we need to find a regimen that minimizes somnolence, maximizes pain control and participation with mobilization.  Also discussed that we should aim for a pain level around a 3 and that a pain level of 0 is not all that realistic.  Pt also has not been out of bed in 4 days so this likely has a lot to do with her pain being exacerbated   We also discussed some of the more recent literature as it pertains to management of geriatric acetabulum fractures   ROS As above Objective:   VITALS:   Vitals:   07/09/20 1950 07/10/20 0428 07/10/20 0724 07/10/20 1401  BP: (!) 116/50 (!) 102/52 (!) 124/57 (!) 124/51  Pulse: 73 75 85 81  Resp: 14 16 15 15   Temp: 98.9 F (37.2 C) 98.4 F (36.9 C) 98.4 F (36.9 C) 98.7 F (37.1 C)  TempSrc: Oral Oral Oral Oral  SpO2: 97% 95% 99% 95%  Weight:      Height:        Estimated body mass index is 21.63 kg/m as calculated from the following:   Height as of this encounter: 5\' 5"  (1.651 m).   Weight as of this encounter: 59 kg.   Intake/Output      07/19 0701 - 07/20 0700 07/20 0701 - 07/21 0700   P.O.  240   I.V. (mL/kg) 1212.9 (20.6)    Total Intake(mL/kg) 1212.9 (20.6) 240 (4.1)   Urine (mL/kg/hr) 200 (0.1)    Total Output 200    Net +1012.9 +240        Urine Occurrence  1 x      LABS  Results for orders placed or performed during the hospital encounter of 07/07/20 (from the past 24 hour(s))  VITAMIN D 25 Hydroxy (Vit-D Deficiency, Fractures)     Status: Abnormal   Collection Time: 07/10/20  4:33 AM  Result Value Ref Range   Vit D, 25-Hydroxy 13.76 (L) 30 - 100 ng/mL  CBC     Status: Abnormal   Collection Time: 07/10/20  4:33 AM  Result Value Ref Range   WBC 9.2 4.0 - 10.5 K/uL   RBC 2.80 (L) 3.87 - 5.11 MIL/uL   Hemoglobin 9.0 (L) 12.0 - 15.0 g/dL   HCT 28.1 (L) 36 - 46 %   MCV 100.4 (H) 80.0 - 100.0 fL   MCH 32.1 26.0 - 34.0 pg   MCHC 32.0 30.0 - 36.0 g/dL   RDW 12.4 11.5 - 15.5 %   Platelets 178 150 - 400 K/uL   nRBC 0.0 0.0 - 0.2 %  Creatinine, serum     Status: Abnormal   Collection Time: 07/10/20  4:33 AM  Result  Value Ref Range   Creatinine, Ser 2.40 (H) 0.44 - 1.00 mg/dL   GFR calc non Af Amer 17 (L) >60 mL/min   GFR calc Af Amer 20 (L) >60 mL/min  Creatinine, urine, random     Status: None   Collection Time: 07/10/20  8:48 AM  Result Value Ref Range   Creatinine, Urine 185.04 mg/dL  Protein / creatinine ratio, urine     Status: Abnormal   Collection Time: 07/10/20  8:48 AM  Result Value Ref Range   Creatinine, Urine 184.30 mg/dL   Total Protein, Urine 58 mg/dL   Protein Creatinine Ratio 0.31 (H) 0.00 - 0.15 mg/mg[Cre]  Sodium, urine, random     Status: None   Collection Time: 07/10/20  8:48 AM  Result Value Ref Range   Sodium, Ur <10 mmol/L     PHYSICAL EXAM:  Gen: appears quite comfortable this afternoon, more awake than yesterday  Lungs: unlabored Cardiac: regular  Ext:       Right Lower Extremity   Resting position and length appear symmetric to contralateral side  Motor and sensory functions intact  Swelling minimal  No pain with passive stretch   Ext warm   + dp pulse  No DCT    Assessment/Plan:     Principal Problem:   Acetabular fracture (HCC) Active Problems:   Hypothyroidism   Hypertension   Anal  cancer ([place])   Anti-infectives (From admission, onward)   None    .  POD/HD#: 35  86 year old female ground-level fall with complex right acetabulum fracture   - ground level fall   - Right anterior column posterior hemitransverse acetabulum fracture              Reviewed with pt and family more recent literature related to treatment of geriatric fractures    We have not completely ruled out surgery but newer literature (retrospective reviews) shows that geriatric pts with acetabular fractures can achieve good functional results with non-op treatment with low incidence for need of conversion THA even with fracture patters that are typically operative patterns in a younger cohort      Most important component with non-op treatment is early mobilization, limit bed rest               TDWB R leg x 6-8 weeks and will need to be bed to chair x 6-8 weeks as she would likely be unsafe to mobilize with a walker TDWB on R leg for distance. If pt can maintain TDWB on R leg while using a walker with supervision then she can by all means mobilize as much as possible. Can also get pt a seated pedal machine to allow for more activity     Pt is not Bed bound. Bedrest is not recommended. It is highly encouraged and medically important  to get her up to the chair and perform transfers and other therapeutic exercises within the confines of WB restrictions to minimize complications    1 year mortality for geriatric acetabulum fractures is similar to that of proximal femur fractures (hip fractures) which is roughly 25 %              Pt will need SNF at discharge    [email or web address]  [email or web address]     - pain management              multimodal analgesia   Pt not tolerating percocet    Continue with low doses fentanyl patch as per medicine, would  strongly recommend wean from patch prior to dc from inpt stay   + scheduled tylenol and low dose robaxin   Trial of po dilaudid   Start with 2mg  po q4h prn, can increase to 4 mg if needed  Minimize IV narcotics    Only use IV after all po's have been used    Would also expect that the patient should be off narcotics by 4-6 weeks                Ice q2h R hip                Pt also on several neurotropic meds.  Will try to minimize any other additions.   Maximize non-narcotic meds.  No NSAIDs due to acute renal injury    Narcan available PRN    - metabolic bone disease             Fracture = low energy which is suggestive of osteoporosis             + vitamin d deficiency    Supplement                           Will need repeat dexa                          Would like to be aggressive with her bone health                                      Likely start forteo or tymlos once discharged                                      Favor tymlos over forteo                                      Monitor renal function    - DVT/PE prophylaxis              SQ heparin   - FEN             reg diet    - Dispo             Therapy evals             Maximize pain control   Mobilize                 Jari Pigg, PA-C [phone] (C) 07/10/2020, 6:30 PM  [facility] [place] [address] [phone] Domingo Sep (F)

## 2020-07-10 NOTE — NC FL2 (Signed)
[place] MEDICAID FL2 LEVEL OF CARE SCREENING TOOL     IDENTIFICATION  Patient Name: Cindy [name] Birthdate: 09/29/1930 Sex: female Admission Date (Current Location): 07/07/2020  [facility] and [place] Number:  Herbalist and Address:  [facility], [place] [address], [place], [place] [address]      Provider Number: [id number]  Attending Physician Name and Address:  Terrilee Croak, MD  Relative Name and Phone Number:  Jerl Mina - daughter - [phone]    Current Level of Care: SNF Recommended Level of Care: [place] Prior Approval Number:    Date Approved/Denied:   PASRR Number: [id number]  Discharge Plan: SNF    Current Diagnoses: Patient Active Problem List   Diagnosis Date Noted  . Acetabular fracture ([place]) 07/07/2020  . Sepsis ([place]) 03/09/2017  . Nausea vomiting and diarrhea 03/09/2017  . Elevated troponin 03/09/2017  . Chest pain 12/25/2013  . Dyspnea 12/25/2013  . Restless leg syndrome 12/25/2013  . Mucositis 10/27/2013  . Hypophosphatemia 10/27/2013  . Protein-calorie malnutrition, severe ([place]) 10/25/2013  . Thrombocytopenia ([place]) 10/25/2013  . Hypomagnesemia 10/24/2013  . Febrile neutropenia ([place]) 10/23/2013  . Anal cancer ([place]) 09/09/2013  . Colon cancer ([place]) 08/31/2013  . Colitis, acute 03/17/2013  . Gastroparesis 10/22/2012  . Hand pain, left 10/22/2012  . Anxiety 10/18/2012  . Hyperventilation syndrome 10/18/2012  . Headache(784.0) 10/18/2012  . Neck pain on right side 10/18/2012  . Hypertension 10/18/2012  . GERD (gastroesophageal reflux disease) 10/18/2012  . Hyperlipidemia 10/18/2012  . Insomnia 10/18/2012  . Hearing loss sensory, bilateral 10/18/2012  . 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 Weakness 10/17/2012  . Hyponatremia 10/17/2012  . Hypokalemia 10/17/2012  . Hypothyroidism 10/17/2012  . Fatigue 10/17/2012    Orientation RESPIRATION BLADDER Height & Weight      Self, Time, Situation, Place  O2 Continent Weight: 59 kg Height:  5\' 5"  (165.1 cm)  BEHAVIORAL SYMPTOMS/MOOD NEUROLOGICAL BOWEL NUTRITION STATUS      Continent Diet (See Discharge Summary)  AMBULATORY STATUS COMMUNICATION OF NEEDS Skin   Extensive Assist Verbally Bruising (Right hip)                       Personal Care Assistance Level of Assistance  Bathing, Dressing Bathing Assistance: Limited assistance   Dressing Assistance: Limited assistance     Functional Limitations Info  Sight, Hearing, Speech Sight Info: Adequate Hearing Info: Adequate Speech Info: Adequate    SPECIAL CARE FACTORS FREQUENCY  PT (By licensed PT), OT (By licensed OT)     PT Frequency: 5 x per week OT Frequency: 5 x per week            Contractures Contractures Info: Not present    Additional Factors Info  Code Status, Allergies Code Status Info: Full code Allergies Info: Vancomycin, Zosyn, Hydrocodone, Morphine           Current Medications (07/10/2020):  This is the current hospital active medication list Current Facility-Administered Medications  Medication Dose Route Frequency Provider Last Rate Last Admin  . 0.9 %  sodium chloride infusion   Intravenous Continuous Dahal, Binaya, MD 10 mL/hr at 07/09/20 1600 Rate Verify at 07/09/20 1600  . ALPRAZolam Duanne Moron) tablet 0.25 mg  0.25 mg Oral Daily Dahal, Binaya, MD   0.25 mg at 07/10/20 0937  . amLODipine (NORVASC) tablet 5 mg  5 mg Oral Daily Blenda Nicely, RPH   5 mg at 07/10/20 3570  .  buPROPion (WELLBUTRIN SR) 12 hr tablet 150 mg  150 mg Oral Daily Dahal, Marlowe Aschoff, MD   150 mg at 07/10/20 0937  . Chlorhexidine Gluconate Cloth 2 % PADS 6 each  6 each Topical [id number] Terrilee Croak, MD   6 each at 07/10/20 0612  . escitalopram (LEXAPRO) tablet 10 mg  10 mg Oral Daily Dahal, Marlowe Aschoff, MD   10 mg at 07/10/20 0937  . feeding supplement (ENSURE ENLIVE) (ENSURE ENLIVE) liquid 237 mL  237 mL Oral BID BM Dahal, Binaya, MD   237 mL at 07/10/20  1205  . fentaNYL (DURAGESIC) 12 MCG/HR 1 patch  1 patch Transdermal Q72H Dahal, Marlowe Aschoff, MD   1 patch at 07/10/20 1204  . heparin injection 5,000 Units  5,000 Units Subcutaneous Q8H Terrilee Croak, MD   5,000 Units at 07/10/20 1205  . hydrALAZINE (APRESOLINE) injection 10 mg  10 mg Intravenous Q4H PRN Rise Patience, MD      . HYDROmorphone (DILAUDID) injection 1 mg  1 mg Intravenous Q3H PRN Terrilee Croak, MD   1 mg at 07/10/20 0741  . levothyroxine (SYNTHROID) tablet 88 mcg  88 mcg Oral QAC breakfast Terrilee Croak, MD   88 mcg at 07/10/20 0610  . multivitamin with minerals tablet 1 tablet  1 tablet Oral Daily Dahal, Marlowe Aschoff, MD   1 tablet at 07/10/20 0937  . mupirocin ointment (BACTROBAN) 2 % 1 application  1 application Nasal BID Terrilee Croak, MD   1 application at 21/30/86 0947  . oxyCODONE-acetaminophen (PERCOCET/ROXICET) 5-325 MG per tablet 1 tablet  1 tablet Oral Q4H PRN Terrilee Croak, MD   1 tablet at 07/10/20 1417  . pantoprazole (PROTONIX) EC tablet 40 mg  40 mg Oral Daily Dahal, Marlowe Aschoff, MD   40 mg at 07/10/20 5784     Discharge Medications: Please see discharge summary for a list of discharge medications.  Relevant Imaging Results:  Relevant Lab Results:   Additional Information SS# [id number]  Curlene Labrum, RN

## 2020-07-10 NOTE — TOC Initial Note (Addendum)
Transition of Care [facility]) - Initial/Assessment Note    Patient Details  Name: Cindy [name] MRN: [id number] Date of Birth: Apr 14, 1930  Transition [facility]) CM/SW Contact:    Curlene Labrum, RN Phone Number: 07/10/2020, 2:14 PM  Clinical Narrative:                 Case management met with the patient S/P fall at home - complicated Right Acetabular fx - medically managed.  Patient lives at home with caregiver during the day through [facility].  The 3 daughters provide support to the patient at night and on the weekends as needed.  I spoke with the patient and daughter and gave Medicare choice regarding SNF placement and the daughter verbalized that she would like her to go to CIR first choice and then SNF as an alternative.  Patient will not need insurance authorization and I will continue to follow for needs in case she is not able to be a candidate for CIR.  PASSR # is [id number]. I spoke with the daughter after Dr. Pietro Cassis mentioned that the patient may need palliative care to follow - choice offered to the daughter and daughter requests [facility] to follow for palliative care - I will followup and call for referral.  Expected Discharge Plan: Skilled Nursing Facility Barriers to Discharge: Continued Medical Work up   Patient Goals and CMS Choice Patient states their goals for this hospitalization and ongoing recovery are:: Transfer to SNF and then home. CMS Medicare.gov Compare Post Acute Care list provided to:: Patient Choice offered to / list presented to : Patient, Adult Children  Expected Discharge Plan and Services Expected Discharge Plan: [place] In-house Referral: Chaplain Discharge Planning Services: CM Consult Post Acute Care Choice: [place] Living arrangements for the past 2 months: Single Family Home                 DME Arranged:  (currently has WC, shower chair, and RW)                    Prior  Living Arrangements/Services Living arrangements for the past 2 months: Single Family Home Lives with:: Self ("[facility]" provides nursing aide from 9-3 pm 5 days a week and daughters on weekend - alone at night) Patient language and need for interpreter reviewed:: Yes Do you feel safe going back to the place where you live?: Yes      Need for Family Participation in Patient Care: Yes (Comment) Care giver support system in place?: Yes (comment) Current home services: DME, Homehealth aide ([facility] aide 5 days a week from 9 am til 3 pm.) Criminal Activity/Legal Involvement Pertinent to Current Situation/Hospitalization: No - Comment as needed  Activities of Daily Living Home Assistive Devices/Equipment: None ADL Screening (condition at time of admission) Patient's cognitive ability adequate to safely complete daily activities?: Yes Is the patient deaf or have difficulty hearing?: No Does the patient have difficulty seeing, even when wearing glasses/contacts?: No Does the patient have difficulty concentrating, remembering, or making decisions?: No Patient able to express need for assistance with ADLs?: Yes Does the patient have difficulty dressing or bathing?: Yes Independently performs ADLs?: No Communication: Independent Dressing (OT): Needs assistance Is this a change from baseline?: Change from baseline, expected to last >3 days Grooming: Needs assistance Is this a change from baseline?: Change from baseline, expected to last >3 days Feeding: Independent Bathing: Needs assistance Is this a change from  baseline?: Change from baseline, expected to last >3 days Toileting: Needs assistance Is this a change from baseline?: Change from baseline, expected to last >3days In/Out Bed: Dependent Is this a change from baseline?: Change from baseline, expected to last <3 days Walks in Home: Independent Does the patient have difficulty walking or climbing stairs?: Yes Weakness of Legs:  Right Weakness of Arms/Hands: None  Permission Sought/Granted Permission sought to share information with : Case Manager Permission granted to share information with : Yes, Verbal Permission Granted     Permission granted to share info w AGENCY: CIR or SNF placement  Permission granted to share info w Relationship: daughters     Emotional Assessment Appearance:: Appears stated age Attitude/Demeanor/Rapport: Gracious Affect (typically observed): Accepting Orientation: : Oriented to Self, Oriented to Place, Oriented to  Time, Oriented to Situation Alcohol / Substance Use: Not Applicable Psych Involvement: No (comment)  Admission diagnosis:  Acetabular fracture ([place]) [S32.409A] Fall, initial encounter [W19.XXXA] Closed displaced fracture of right acetabulum, unspecified portion of acetabulum, initial encounter [facility]) [S32.401A] Patient Active Problem List   Diagnosis Date Noted  . Acetabular fracture ([place]) 07/07/2020  . Sepsis ([place]) 03/09/2017  . Nausea vomiting and diarrhea 03/09/2017  . Elevated troponin 03/09/2017  . Chest pain 12/25/2013  . Dyspnea 12/25/2013  . Restless leg syndrome 12/25/2013  . Mucositis 10/27/2013  . Hypophosphatemia 10/27/2013  . Protein-calorie malnutrition, severe ([place]) 10/25/2013  . Thrombocytopenia ([place]) 10/25/2013  . Hypomagnesemia 10/24/2013  . Febrile neutropenia ([place]) 10/23/2013  . Anal cancer ([place]) 09/09/2013  . Colon cancer ([place]) 08/31/2013  . Colitis, acute 03/17/2013  . Gastroparesis 10/22/2012  . Hand pain, left 10/22/2012  . Anxiety 10/18/2012  . Hyperventilation syndrome 10/18/2012  . Headache(784.0) 10/18/2012  . Neck pain on right side 10/18/2012  . Hypertension 10/18/2012  . GERD (gastroesophageal reflux disease) 10/18/2012  . Hyperlipidemia 10/18/2012  . Insomnia 10/18/2012  . Hearing loss sensory, bilateral 10/18/2012  . 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 Weakness 10/17/2012  . Hyponatremia 10/17/2012  .  Hypokalemia 10/17/2012  . Hypothyroidism 10/17/2012  . Fatigue 10/17/2012   PCP:  Josetta Huddle, MD Pharmacy:   [id number]Phone: [phone]: [phone] [facility] [id number]: [phone]: [address] [address] [address]Phone: [phone]: [phone]    Social Determinants of Health ([place] Interventions    Readmission Risk Interventions Readmission Risk Prevention Plan 07/10/2020  Transportation Screening Complete  PCP or Specialist Appt within 5-7 Days Complete  Home Care Screening Complete  Medication Review (RN CM) Complete  Some recent data might be hidden

## 2020-07-10 NOTE — TOC CAGE-AID Note (Signed)
Transition of Care [facility]) - CAGE-AID Screening   Patient Details  Name: Cindy [name] MRN: [id number] Date of Birth: October 06, 1930  Transition of Care [facility]) CM/SW Contact:    Emeterio Reeve, [place] Phone Number: 07/10/2020, 12:37 PM   Clinical Narrative:  CSW met with pt at bedside. CSW introduced self and explained her role at the hospital.  CSW denied alcohol use and substance use. Pt did not need resources at this time.   CAGE-AID Screening:    Have You Ever Felt You Ought to Cut Down on Your Drinking or Drug Use?: No Have People Annoyed You By Critizing Your Drinking Or Drug Use?: No Have You Felt Bad Or Guilty About Your Drinking Or Drug Use?: No Have You Ever Had a Drink or Used Drugs First Thing In The Morning to Steady Your Nerves or to Get Rid of a Hangover?: No CAGE-AID Score: 0  Substance Abuse Education Offered: Yes  Substance abuse interventions: Patient Counseling  Emeterio Reeve, Latanya Presser, [place] Social Worker [phone]

## 2020-07-10 NOTE — Progress Notes (Signed)
Occupational Therapy Evaluation Patient Details Name: Cindy [name] MRN: [id number] DOB: [birth year] Today's Date: 07/10/2020    History of Present Illness Patient is a 86 y/o female who presents with complicated right acetabular fx s/p fall. At this time, being managed conservatively. PMH includes rectal ca, HTN, HLD.   Clinical Impression   Patient living at home alone with caregiver assist (9-3pm 5 days/week) and help from daughters PTA.  Patient requiring assist with ADLs and typically furniture walks in home.  Today patient in a lot of pain, though medicated prior to therapy.  Patient very lethargic and not able to answer questions or follow commands consistently.  Required total assist x2 to get to EOB.  Progressed from max assist to maintain seated balance to min guard.  When given washcloth to wash face and max verbal/tactile cueing, patient still needing total assist.  Currently total assist for all ADLs at this time.  Will continue to follow with OT acutely to address the deficits listed below.      Follow Up Recommendations  SNF;Supervision/Assistance - 24 hour    Equipment Recommendations  Other (comment) (defer to next venue)    Recommendations for Other Services       Precautions / Restrictions Precautions Precautions: Fall;Other (comment) Precaution Comments: R LE TDWB for trs Restrictions Weight Bearing Restrictions: Yes RLE Weight Bearing: Touchdown weight bearing Other Position/Activity Restrictions: f      Mobility Bed Mobility Overal bed mobility: Needs Assistance Bed Mobility: Rolling;Sidelying to Sit;Sit to Supine Rolling: Total assist;+2 for physical assistance Sidelying to sit: Total assist;+2 for physical assistance;HOB elevated   Sit to supine: Total assist;+2 for physical assistance;HOB elevated   General bed mobility comments: Total A for bed mobility using pad to assist to get to EOB and to return to supine. "I can't" when asked to come down  onto left elbow to lay down.  Transfers                 General transfer comment: Deferred due to arousal, cognition, safety.    Balance Overall balance assessment: Needs assistance Sitting-balance support: Feet supported;Single extremity supported Sitting balance-Leahy Scale: Fair Sitting balance - Comments: Progressing from max assis to maintain balance to min guard                                   ADL either performed or assessed with clinical judgement   ADL Overall ADL's : Needs assistance/impaired                                     Functional mobility during ADLs: Total assistance;+2 for physical assistance;+2 for safety/equipment;Cueing for sequencing;Cueing for safety General ADL Comments: Patient is total assist right now with ADLs due to poor cognition/command following and pain level     Vision         Perception     Praxis      Pertinent Vitals/Pain Pain Assessment: Faces Faces Pain Scale: Hurts even more Pain Location: right hip with movement Pain Descriptors / Indicators: Grimacing;Guarding;Moaning Pain Intervention(s): Limited activity within patient's tolerance;Monitored during session;Repositioned;Premedicated before session     Hand Dominance Left   Extremity/Trunk Assessment Upper Extremity Assessment Upper Extremity Assessment: Generalized weakness;RUE deficits/detail;LUE deficits/detail RUE Deficits / Details: Strength 2/5 grossly.  Difficult passively moving UE.  Unsure if because patient was resisting  or if there is some increased tone RUE Coordination: decreased fine motor;decreased gross motor LUE Deficits / Details: Strength 3-/5 grossly.  Difficult passively moving UE.  Unsure if because patient was resisting or if there is some increased tone LUE Coordination: decreased fine motor;decreased gross motor   Lower Extremity Assessment Lower Extremity Assessment: Defer to PT evaluation   Cervical / Trunk  Assessment Cervical / Trunk Assessment: Kyphotic   Communication Communication Communication: HOH   Cognition Arousal/Alertness: Lethargic;Suspect due to medications Behavior During Therapy: Flat affect Overall Cognitive Status: Impaired/Different from baseline Area of Impairment: Orientation;Memory;Attention;Following commands;Problem solving                 Orientation Level: Disoriented to;Situation;Time Current Attention Level: Focused Memory: Decreased short-term memory;Decreased recall of precautions Following Commands: Follows one step commands inconsistently;Follows one step commands with increased time     Problem Solving: Slow processing;Decreased initiation;Difficulty sequencing;Requires verbal cues;Requires tactile cues General Comments: Patient keeping eyes closed during most of session.  Only answering some questions.  Difficulty keeping attention for more than ~10 seconds   General Comments      Exercises    Shoulder Instructions      Home Living Family/patient expects to be discharged to:: Private residence Living Arrangements: Alone;Other (Comment);Non-relatives/Friends Available Help at Discharge: Family;Available PRN/intermittently;Personal care attendant (9am-3pm, 5 days/week) Type of Home: House Home Access: Stairs to enter [employer] of Steps: 5 Entrance Stairs-Rails: Right;Left Home Layout: One level     Bathroom Shower/Tub: Tub/shower unit;Curtain   Bathroom Toilet: Handicapped height     Home Equipment: [place] - single point;Walker - 2 wheels   Additional Comments: Daughter gave PLOF and home set up info as patient is a poor historian      Prior Functioning/Environment Level of Independence: Needs assistance  Gait / Transfers Assistance Needed: Typically furniture walks ADL's / Homemaking Assistance Needed: Has caregiver's assist with ADLs and children come and help when caregiver is not there   Comments: Has caregivers  who help with ADLs/IADLs. Uses SPC sometimes but not really. Daughters/family assist on weekends.        OT Problem List: Decreased strength;Decreased range of motion;Decreased activity tolerance;Impaired balance (sitting and/or standing);Decreased cognition;Decreased coordination;Decreased safety awareness;Decreased knowledge of use of DME or AE;Decreased knowledge of precautions;Pain;Impaired UE functional use      OT Treatment/Interventions: Self-care/ADL training;Therapeutic exercise;Energy conservation;DME and/or AE instruction;Modalities;Therapeutic activities;Cognitive remediation/compensation;Patient/family education;Balance training    OT Goals(Current goals can be found in the care plan section) Acute Rehab OT Goals Patient Stated Goal: pt not able to state but daughter states pt cannot return home OT Goal Formulation: With family Time For Goal Achievement: 07/24/20 Potential to Achieve Goals: Fair  OT Frequency: Min 2X/week   Barriers to D/C: Decreased caregiver support          Co-evaluation PT/OT/SLP Co-Evaluation/Treatment: Yes Reason for Co-Treatment: Complexity of the patient's impairments (multi-system involvement);Necessary to address cognition/behavior during functional activity;For patient/therapist safety PT goals addressed during session: Mobility/safety with mobility;Balance;Strengthening/ROM OT goals addressed during session: ADL's and self-care;Strengthening/ROM      AM-PAC OT "6 Clicks" Daily Activity     Outcome Measure Help from another person eating meals?: Total Help from another person taking care of personal grooming?: Total Help from another person toileting, which includes using toliet, bedpan, or urinal?: Total Help from another person bathing (including washing, rinsing, drying)?: Total Help from another person to put on and taking off regular upper body clothing?: Total Help from another person to put on and  taking off regular lower body  clothing?: Total 6 Click Score: 6   End of Session Nurse Communication: Mobility status  Activity Tolerance: Patient limited by pain Patient left: in bed;with call bell/phone within reach;with bed alarm set;with family/visitor present  OT Visit Diagnosis: Unsteadiness on feet (R26.81);Repeated falls (R29.6);History of falling (Z91.81);Muscle weakness (generalized) (M62.81);Other symptoms and signs involving cognitive function;Feeding difficulties (R63.3);Pain Pain - Right/Left: Right Pain - part of body: Hip;Leg                Time: 9914-4458 OT Time Calculation (min): 28 min Charges:  OT General Charges $OT Visit: 1 Visit OT Evaluation $OT Eval Moderate Complexity: 1 [address] OTR/L   Phylliss Bob 07/10/2020, 12:40 PM

## 2020-07-10 NOTE — Evaluation (Signed)
Physical Therapy Evaluation Patient Details Name: Cindy [name] MRN: [id number] DOB: [birth year] Today's Date: 07/10/2020   History of Present Illness  Patient is a 86 y/o female who presents with complicated right acetabular fx s/p fall. At this time, being managed conservatively. PMH includes rectal ca, HTN, HLD.  Clinical Impression  Patient presents with generalized weakness, decreased activity tolerance, decreased arousal, pain and impaired mobility s/p above. Pt is lethargic likely from pain medications administered prior to arrival. Daughter present to provide information regarding PLOF. Pt lived alone and had a caregiver assisting with ADls/IADLs 5 days/week and family assisting on weekends. Pt walking with SPC or furniture walking. Today, pt requires total A of 2 for bed mobility and able to sit EOB with close Min guard for safety. Not engaging much in ADL tasks despite max cues. Needs constant stimulus to stay attended to task and aroused. Would benefit from SNF to maximize independence and mobility prior to return home. Will follow acutely.    Follow Up Recommendations SNF;Supervision for mobility/OOB;Supervision/Assistance - 24 hour    Equipment Recommendations  None recommended by PT    Recommendations for Other Services       Precautions / Restrictions Precautions Precautions: Fall Restrictions Weight Bearing Restrictions: Yes RLE Weight Bearing: Touchdown weight bearing Other Position/Activity Restrictions: f      Mobility  Bed Mobility Overal bed mobility: Needs Assistance Bed Mobility: Rolling;Sidelying to Sit;Sit to Supine Rolling: Total assist;+2 for physical assistance Sidelying to sit: Total assist;+2 for physical assistance;HOB elevated   Sit to supine: Total assist;+2 for physical assistance;HOB elevated   General bed mobility comments: Total A for bed mobility using pad to assist to get to EOB and to return to supine. "I can't" when asked to come down  onto left elbow to lay down.  Transfers                 General transfer comment: Deferred due to arousal, cognition, safety.  Ambulation/Gait             General Gait Details: Unable  Stairs            Wheelchair Mobility    Modified Rankin (Stroke Patients Only)       Balance Overall balance assessment: Needs assistance Sitting-balance support: Feet supported;Single extremity supported Sitting balance-Leahy Scale: Fair Sitting balance - Comments: Initially needing Max A to maintain sitting balance progressing to close min guard. Cues to open eyes. Not engaging in attempted ADLs tasks at EOB.                                     Pertinent Vitals/Pain Pain Assessment: Faces Faces Pain Scale: Hurts even more Pain Location: right hip with movement Pain Descriptors / Indicators: Grimacing;Guarding;Moaning Pain Intervention(s): Monitored during session;Repositioned;Limited activity within patient's tolerance;Premedicated before session    Home Living Family/patient expects to be discharged to:: Private residence Living Arrangements: Alone;Other (Comment);Non-relatives/Friends Available Help at Discharge: Family;Available PRN/intermittently;Personal care attendant (5 days/week) Type of Home: House Home Access: Stairs to enter Entrance Stairs-Rails: Psychiatric nurse of Steps: 5 Home Layout: One level Home Equipment: Cane - single point;Walker - 2 wheels      Prior Function Level of Independence: Independent         Comments: Has caregivers who help with ADLs/IADLs. Uses SPC sometimes but not really. Daughters/family assist on weekends.     Hand Dominance   Dominant Hand:  Left    Extremity/Trunk Assessment   Upper Extremity Assessment Upper Extremity Assessment: Defer to OT evaluation    Lower Extremity Assessment Lower Extremity Assessment: Generalized weakness;Difficult to assess due to impaired cognition  (Limited movement of RLE/UE)    Cervical / Trunk Assessment Cervical / Trunk Assessment: Kyphotic  Communication   Communication: HOH  Cognition Arousal/Alertness: Lethargic;Suspect due to medications Behavior During Therapy: Flat affect Overall Cognitive Status: Impaired/Different from baseline Area of Impairment: Orientation;Memory;Attention;Following commands;Problem solving                 Orientation Level: Disoriented to;Situation;Time Current Attention Level: Focused Memory: Decreased short-term memory Following Commands: Follows one step commands with increased time;Follows multi-step commands inconsistently     Problem Solving: Slow processing;Decreased initiation;Difficulty sequencing;Requires verbal cues;Requires tactile cues General Comments: Pt given pain meds prior to session, having lingering affects of this. Poor attention and arousal. Able to follow simple commands inconsistently with repetition but difficulty sustaining task.      General Comments General comments (skin integrity, edema, etc.): Daughter present during session to provide PLOF/history but stepped out for session. States pt might have surgery todaya nd cannot tolerate much activity due to pain.    Exercises General Exercises - Lower Extremity Ankle Circles/Pumps:  (able to wiggle toes) Long Arc Quad: PROM;Left;5 reps;Seated   Assessment/Plan    PT Assessment Patient needs continued PT services  PT Problem List Decreased strength;Decreased mobility;Decreased safety awareness;Impaired tone;Decreased range of motion;Decreased cognition;Pain;Decreased activity tolerance;Decreased balance       PT Treatment Interventions Therapeutic activities;Gait training;Therapeutic exercise;Patient/family education;Wheelchair mobility training;Balance training;Functional mobility training;Manual techniques;DME instruction    PT Goals (Current goals can be found in the Care Plan section)  Acute Rehab PT  Goals Patient Stated Goal: pt not able to state but daughter states pt cannot return home PT Goal Formulation: With family Time For Goal Achievement: 07/24/20 Potential to Achieve Goals: Fair    Frequency Min 2X/week   Barriers to discharge Decreased caregiver support      Co-evaluation PT/OT/SLP Co-Evaluation/Treatment: Yes Reason for Co-Treatment: Complexity of the patient's impairments (multi-system involvement);Necessary to address cognition/behavior during functional activity;For patient/therapist safety PT goals addressed during session: Mobility/safety with mobility;Balance;Strengthening/ROM         AM-PAC PT "6 Clicks" Mobility  Outcome Measure Help needed turning from your back to your side while in a flat bed without using bedrails?: Total Help needed moving from lying on your back to sitting on the side of a flat bed without using bedrails?: Total Help needed moving to and from a bed to a chair (including a wheelchair)?: Total Help needed standing up from a chair using your arms (e.g., wheelchair or bedside chair)?: Total Help needed to walk in hospital room?: Total Help needed climbing 3-5 steps with a railing? : Total 6 Click Score: 6    End of Session Equipment Utilized During Treatment: Oxygen Activity Tolerance: Patient limited by pain;Patient limited by lethargy Patient left: in bed;with call bell/phone within reach;with bed alarm set;with SCD's reapplied Nurse Communication: Mobility status;Need for lift equipment PT Visit Diagnosis: Pain;Muscle weakness (generalized) (M62.81);Difficulty in walking, not elsewhere classified (R26.2) Pain - Right/Left: Right Pain - part of body: Hip    Time: 9937-1696 PT Time Calculation (min) (ACUTE ONLY): 27 min   Charges:   PT Evaluation $PT Eval Moderate Complexity: 1 Mod          Marisa Severin, PT, DPT Acute Rehabilitation Services Pager [phone] Office [phone]      [email or web address]  A Latori Beggs 07/10/2020,  9:56 AM

## 2020-07-10 NOTE — Progress Notes (Signed)
PROGRESS NOTE  Cindy [name]  DOB: 09-02-1930  PCP: Josetta Huddle, MD DEY:[id number]  DOA: 07/07/2020  LOS: 3 days   Chief Complaint  Patient presents with  . Fall   Brief narrative: LEDORA DELKER is a 86 y.o. female with history of hypertension, hypothyroidism, anal cancer in remission. Patient was brought to the ED on 7/17 after a fall in her yard leading to right hip pain  In the ER patient had a CT of the pelvis which shows right-sided acetabular fracture.  Labs are remarkable for WBC count of 14.4 hemoglobin 11.8 creatinine 1.2.  Covid test was negative. Patient was admitted to hospitalist service.  Orthopedics Dr. Marlou Sa was consulted.   Subjective: Patient was seen and examined this morning.   Lying down in bed.  Daughter at bedside. Reports inadequate pain control. Labs from this morning showed creatinine further trending up, 2.4 this morning.  Hemoglobin down to 9. no active bleeding.  Assessment/Plan: Right-sided acetabular fracture -After a fall -Orthopedics consult appreciated.  Based on patient's advanced age, poor bone quality and limited ability to achieve and maintain anatomic reduction, nonoperative management is recommended.  -Per orthopedics, patient will be touchdown weightbearing on the right leg for 8 weeks and then she will need to be bed to chair for 6 to 8 weeks.   -On Lovenox for DVT prophylaxis. -Patient complains of inadequate pain control with frequent flareups.  Patient is currently on tramadol 50 to 100 mg twice daily as needed and Dilaudid 1 mg IV every 3 hours as needed. -Since patient will have a long course of recovery in the best case scenario, I will start the patient on fentanyl patch with oral Percocet as needed.  Hopefully we can minimize the use of IV Dilaudid.    Acute renal failure -Likely from combination of dehydration and ACE inhibitor.  -Creatinine 1.1 on admission.  Consistently trending up 1.87 > 2.4 despite IV fluids.  -I  we will continue normal saline at 100/h for next 24 hours.  Repeat creatinine tomorrow.  -Also obtain urine electrolytes and ultrasound renal.    Hypertension  -Home meds include amlodipine 5 mg daily, valsartan 160 mg daily.  Amlodipine resumed.  Valsartan on hold because of AKI.  IV hydralazine as needed.  Hypothyroidism  -resume Synthroid  Anemia  -Chronic and stable.    Acute respiratory failure with hypoxia -Not on supplemental oxygen at home. Currently requiring 2 L/min. No fever, no WBC count.  -No cough or other respiratory symptoms.  Likely atelectasis due to immobility related to oxygen dependence. -Incentive spirometry ordered.  Long-term prognosis -Patient seems to have guarded prognosis because of fracture, inability to get surgery, anticipated bedbound status for next 6 to 8 weeks and high likelihood of complications related to her age and dementia. -I discussed with patient's daughter at bedside today about possible poor prognosis -Palliative care consultation called.  Mobility: Needs PT evaluation Code Status:   Code Status: Full Code  Nutritional status: Body mass index is 21.63 kg/m. Nutrition Problem: Increased nutrient needs Etiology: acute illness Signs/Symptoms: estimated needs Diet Order            Diet regular Room service appropriate? Yes; Fluid consistency: Thin  Diet effective now                 DVT prophylaxis: heparin injection 5,000 Units Start: 07/10/20 1300 SCDs Start: 07/07/20 2031   Antimicrobials:  None Fluid: Normal saline at 100 mL per hr  Consultants: Orthopedics Family  Communication:  Daughter at bedside  Status is: Inpatient  Remains inpatient appropriate because:Ongoing active pain requiring inpatient pain management and IV treatments appropriate due to intensity of illness or inability to take PO   Dispo: The patient is from: Home              Anticipated d/c is to: SNF              Anticipated d/c date is: 2 days               Patient currently is not medically stable to d/c.   Infusions:  . sodium chloride 10 mL/hr at 07/09/20 1600    Scheduled Meds: . ALPRAZolam  0.25 mg Oral Daily  . amLODipine  5 mg Oral Daily  . buPROPion  150 mg Oral Daily  . Chlorhexidine Gluconate Cloth  6 each Topical Q0600  . escitalopram  10 mg Oral Daily  . feeding supplement (ENSURE ENLIVE)  237 mL Oral BID BM  . fentaNYL  1 patch Transdermal Q72H  . heparin injection (subcutaneous)  5,000 Units Subcutaneous Q8H  . levothyroxine  88 mcg Oral QAC breakfast  . multivitamin with minerals  1 tablet Oral Daily  . mupirocin ointment  1 application Nasal BID  . pantoprazole  40 mg Oral Daily    Antimicrobials: Anti-infectives (From admission, onward)   None      PRN meds: hydrALAZINE, HYDROmorphone (DILAUDID) injection, oxyCODONE-acetaminophen   Objective: Vitals:   07/10/20 0428 07/10/20 0724  BP: (!) 102/52 (!) 124/57  Pulse: 75 85  Resp: 16 15  Temp: 98.4 F (36.9 C) 98.4 F (36.9 C)  SpO2: 95% 99%    Intake/Output Summary (Last 24 hours) at 07/10/2020 1122 Last data filed at 07/10/2020 0900 Gross per 24 hour  Intake 1332.88 ml  Output 200 ml  Net 1132.88 ml   Filed Weights   07/07/20 1642  Weight: 59 kg   Weight change:  Body mass index is 21.63 kg/m.   Physical Exam: General exam: Appears calm and comfortable.  Mild to moderate distress from pain Skin: No rashes, lesions or ulcers. HEENT: Atraumatic, normocephalic, supple neck, no obvious bleeding Lungs: Diminished air entry in the bases.  No crackles or wheezing. CVS: Regular rate and rhythm, no murmur GI/Abd soft, nontender, nondistended, bowel sound present CNS: Alert, awake oriented to place and person Psychiatry: Depressed because of pain Extremities: No pedal edema, no calf tenderness  Data Review: I have personally reviewed the laboratory data and studies available.  Recent Labs  Lab 07/07/20 1802 07/09/20 0159 07/10/20 0433   WBC 14.4* 9.1 9.2  NEUTROABS 12.3* 6.5  --   HGB 11.8* 9.9* 9.0*  HCT 36.6 30.9* 28.1*  MCV 98.4 99.7 100.4*  PLT 263 201 178   Recent Labs  Lab 07/07/20 1802 07/09/20 0159 07/10/20 0433  NA 141 135  --   K 3.5 3.9  --   CL 109 103  --   CO2 19* 23  --   GLUCOSE 123* 141*  --   BUN 17 35*  --   CREATININE 1.21* 1.87* 2.40*  CALCIUM 9.3 8.7*  --     Signed, Terrilee Croak, MD [facility] Pager: [phone] (Secure Chat preferred). 07/10/2020

## 2020-07-10 NOTE — Plan of Care (Signed)
  Problem: Education: Goal: Verbalization of understanding the information provided (i.e., activity precautions, restrictions, etc) will improve Outcome: Progressing Goal: Individualized Educational Video(s) Outcome: Progressing   Problem: Pain Management: Goal: Pain level will decrease Outcome: Progressing   Problem: Education: Goal: Knowledge of General Education information will improve Description: Including pain rating scale, medication(s)/side effects and non-pharmacologic comfort measures Outcome: Progressing

## 2020-07-11 ENCOUNTER — Inpatient Hospital Stay (HOSPITAL_COMMUNITY): Payer: Medicare Other

## 2020-07-11 DIAGNOSIS — Z66 Do not resuscitate: Secondary | ICD-10-CM

## 2020-07-11 DIAGNOSIS — S32409A Unspecified fracture of unspecified acetabulum, initial encounter for closed fracture: Secondary | ICD-10-CM

## 2020-07-11 DIAGNOSIS — R627 Adult failure to thrive: Secondary | ICD-10-CM

## 2020-07-11 DIAGNOSIS — Z515 Encounter for palliative care: Secondary | ICD-10-CM

## 2020-07-11 LAB — BASIC METABOLIC PANEL
Anion gap: 7 (ref 5–15)
BUN: 40 mg/dL — ABNORMAL HIGH (ref 8–23)
CO2: 23 mmol/L (ref 22–32)
Calcium: 8.7 mg/dL — ABNORMAL LOW (ref 8.9–10.3)
Chloride: 103 mmol/L (ref 98–111)
Creatinine, Ser: 1.6 mg/dL — ABNORMAL HIGH (ref 0.44–1.00)
GFR calc Af Amer: 33 mL/min — ABNORMAL LOW (ref >60)
GFR calc non Af Amer: 28 mL/min — ABNORMAL LOW (ref >60)
Glucose, Bld: 134 mg/dL — ABNORMAL HIGH (ref 70–99)
Potassium: 3.9 mmol/L (ref 3.5–5.1)
Sodium: 133 mmol/L — ABNORMAL LOW (ref 135–145)

## 2020-07-11 LAB — CBC WITH DIFFERENTIAL/PLATELET
Abs Immature Granulocytes: 0.05 10*3/uL (ref 0.00–0.07)
Basophils Absolute: 0 10*3/uL (ref 0.0–0.1)
Basophils Relative: 0 %
Eosinophils Absolute: 0.2 10*3/uL (ref 0.0–0.5)
Eosinophils Relative: 2 %
HCT: 26 % — ABNORMAL LOW (ref 36.0–46.0)
Hemoglobin: 8.4 g/dL — ABNORMAL LOW (ref 12.0–15.0)
Immature Granulocytes: 1 %
Lymphocytes Relative: 14 %
Lymphs Abs: 1.3 10*3/uL (ref 0.7–4.0)
MCH: 31.9 pg (ref 26.0–34.0)
MCHC: 32.3 g/dL (ref 30.0–36.0)
MCV: 98.9 fL (ref 80.0–100.0)
Monocytes Absolute: 1 10*3/uL (ref 0.1–1.0)
Monocytes Relative: 10 %
Neutro Abs: 6.8 10*3/uL (ref 1.7–7.7)
Neutrophils Relative %: 73 %
Platelets: 179 10*3/uL (ref 150–400)
RBC: 2.63 MIL/uL — ABNORMAL LOW (ref 3.87–5.11)
RDW: 12.3 % (ref 11.5–15.5)
WBC: 9.3 10*3/uL (ref 4.0–10.5)
nRBC: 0 % (ref 0.0–0.2)

## 2020-07-11 LAB — SARS CORONAVIRUS 2 BY RT PCR (HOSPITAL ORDER, PERFORMED IN ~~LOC~~ HOSPITAL LAB): SARS Coronavirus 2: NEGATIVE

## 2020-07-11 NOTE — TOC Progression Note (Signed)
Transition of Care [facility]) - Progression Note    Patient Details  Name: Cindy [name] MRN: [id number] Date of Birth: 16-May-1930  Transition of Care [facility]) CM/SW [place], RN Phone Number: 07/11/2020, 1:06 PM  Clinical Narrative:    Case management spoke with the daughter, Jerl Mina and they have chosen [facility] as choice for admission.  I spoke with Elyse Hsu at [facility] and they have a bed available for her when she is medically stable to transfer.  A copy of the patient's COVID card was sent securely to [facility].  The patient has Medicare A and B along with [employer] and will not need insurance authorization.  She will need a repeat COVID prior to transfer once the patient is medically stable.  Will continue to follow for admission.   Expected Discharge Plan: Skilled Nursing Facility Barriers to Discharge: Continued Medical Work up  Expected Discharge Plan and Services Expected Discharge Plan: [place] In-house Referral: Chaplain Discharge Planning Services: CM Consult Post Acute Care Choice: [place] Living arrangements for the past 2 months: Single Family Home                 DME Arranged:  (currently has WC, shower chair, and RW)       Social Determinants of Health (SDOH) Interventions    Readmission Risk Interventions Readmission Risk Prevention Plan 07/10/2020  Transportation Screening Complete  PCP or Specialist Appt within 5-7 Days Complete  Home Care Screening Complete  Medication Review (RN CM) Complete  Some recent data might be hidden

## 2020-07-11 NOTE — Progress Notes (Signed)
PROGRESS NOTE  Cindy [name]  DOB: [birth year]  PCP: Josetta Huddle, MD GYI:[id number]  DOA: 07/07/2020  LOS: 4 days   Chief Complaint  Patient presents with  . Fall   Brief narrative: Cindy [name] is a 86 y.o. female with history of hypertension, hypothyroidism, anal cancer in remission. Patient was brought to the ED on 7/17 after a fall in her yard leading to right hip pain  In the ER patient had a CT of the pelvis which shows right-sided acetabular fracture.  Labs are remarkable for WBC count of 14.4 hemoglobin 11.8 creatinine 1.2.  Covid test was negative. Patient was admitted to hospitalist service.  Orthopedics Dr. Marlou Sa was consulted.   Subjective: Patient was seen and examined this morning.   Lying down in bed.  Daughter at bedside. Patient does not seem to be in pain this morning.  Has a fentanyl patch on right arm applied yesterday. Labs from this morning showed creatinine level improving to 1.6, hemoglobin down to 8.4.  Assessment/Plan: Right-sided acetabular fracture -After a fall -Orthopedics consult appreciated.  Based on patient's advanced age, poor bone quality and limited ability to achieve and maintain anatomic reduction, nonoperative management is recommended.  -Per orthopedics, patient will be touchdown weightbearing on the right leg for 8 weeks and then she will need to be bed to chair for 6 to 8 weeks.   -On Lovenox for DVT prophylaxis. -Pain medicines being adjusted.  Fentanyl patch applied yesterday.  Plan is to stop fentanyl patch before discharge.  Currently on oral Norco and as needed IV Dilaudid.  Minimize the use of IV Dilaudid. -Patient needs strong encouragement to participate with physical therapy.  Acute renal failure -Likely from combination of dehydration and ACE inhibitor.  -Creatinine 1.1 on admission.  Creatinine trended up to peak at 2.40 on 7/20.  -Down to 1.6 today with hydration.  Reduce IV fluid to 50 mill per hour.  Repeat  creatinine tomorrow.  Hypertension  -Home meds include amlodipine 5 mg daily, valsartan 160 mg daily.  Amlodipine resumed.  Valsartan on hold because of AKI.  IV hydralazine as needed.  Hypothyroidism  -resume Synthroid  Anemia  -Chronic and stable.    Acute respiratory failure with hypoxia -Not on supplemental oxygen at home. Currently requiring 2 L/min. No fever, no WBC count.  -No cough or other respiratory symptoms.  Likely atelectasis due to immobility related to oxygen dependence. -Incentive spirometry encouraged again.  Long-term prognosis -Patient seems to have guarded prognosis because of fracture, inability to get surgery, anticipated bedbound status for next 6 to 8 weeks and high likelihood of complications related to her age and dementia. -I have had long conversation with patient and her daughter at bedside and multiple occasions about this. -Palliative care consultation called.  Mobility: PT evaluation to be tried again today Code Status:   Code Status: Full Code  Nutritional status: Body mass index is 21.63 kg/m. Nutrition Problem: Increased nutrient needs Etiology: acute illness Signs/Symptoms: estimated needs Diet Order            Diet regular Room service appropriate? Yes; Fluid consistency: Thin  Diet effective now                 DVT prophylaxis: heparin injection 5,000 Units Start: 07/10/20 1300 SCDs Start: 07/07/20 2031   Antimicrobials:  None Fluid: Normal saline at 50 mL per hr  Consultants: Orthopedics Family Communication:  Daughter at bedside  Status is: Inpatient  Remains inpatient appropriate because:Ongoing active  pain requiring inpatient pain management and IV treatments appropriate due to intensity of illness or inability to take PO   Dispo: The patient is from: Home              Anticipated d/c is to: SNF              Anticipated d/c date is: 2 days              Patient currently is not medically stable to d/c.   Infusions:  .  sodium chloride 10 mL/hr at 07/09/20 1600    Scheduled Meds: . acetaminophen  1,000 mg Oral Q8H  . ALPRAZolam  0.25 mg Oral Daily  . amLODipine  5 mg Oral Daily  . buPROPion  150 mg Oral Daily  . Chlorhexidine Gluconate Cloth  6 each Topical Q0600  . escitalopram  10 mg Oral Daily  . feeding supplement (ENSURE ENLIVE)  237 mL Oral BID BM  . heparin injection (subcutaneous)  5,000 Units Subcutaneous Q8H  . HYDROmorphone  2 mg Oral Once  . levothyroxine  88 mcg Oral QAC breakfast  . methocarbamol  500 mg Oral TID  . multivitamin with minerals  1 tablet Oral Daily  . mupirocin ointment  1 application Nasal BID  . pantoprazole  40 mg Oral Daily    Antimicrobials: Anti-infectives (From admission, onward)   None      PRN meds: hydrALAZINE, HYDROmorphone (DILAUDID) injection, HYDROmorphone, naLOXone (NARCAN)  injection   Objective: Vitals:   07/10/20 1915 07/11/20 0542  BP: (!) 128/56 (!) 122/50  Pulse: 79 70  Resp: 17 16  Temp: 98.1 F (36.7 C) 97.9 F (36.6 C)  SpO2: 96% 99%    Intake/Output Summary (Last 24 hours) at 07/11/2020 1113 Last data filed at 07/11/2020 0700 Gross per 24 hour  Intake 660 ml  Output 600 ml  Net 60 ml   Filed Weights   07/07/20 1642  Weight: 59 kg   Weight change:  Body mass index is 21.63 kg/m.   Physical Exam: General exam: Appears calm and comfortable.  Not in distress from pain today.   Skin: No rashes, lesions or ulcers. HEENT: Atraumatic, normocephalic, supple neck, no obvious bleeding Lungs: Diminished air entry in bases, no crackles or wheezing CVS: Regular rate and rhythm, no murmur GI/Abd soft, nontender, nondistended, bowel sound present CNS: Alert, awake oriented to place and person Psychiatry: Cheerful pain Extremities: No pedal edema, no calf tenderness  Data Review: I have personally reviewed the laboratory data and studies available.  Recent Labs  Lab 07/07/20 1802 07/09/20 0159 07/10/20 0433 07/11/20 0308   WBC 14.4* 9.1 9.2 9.3  NEUTROABS 12.3* 6.5  --  6.8  HGB 11.8* 9.9* 9.0* 8.4*  HCT 36.6 30.9* 28.1* 26.0*  MCV 98.4 99.7 100.4* 98.9  PLT 263 201 178 179   Recent Labs  Lab 07/07/20 1802 07/09/20 0159 07/10/20 0433 07/11/20 0308  NA 141 135  --  133*  K 3.5 3.9  --  3.9  CL 109 103  --  103  CO2 19* 23  --  23  GLUCOSE 123* 141*  --  134*  BUN 17 35*  --  40*  CREATININE 1.21* 1.87* 2.40* 1.60*  CALCIUM 9.3 8.7*  --  8.7*    Signed, Terrilee Croak, MD [facility] Pager: [phone] (Secure Chat preferred). 07/11/2020

## 2020-07-11 NOTE — Plan of Care (Signed)

## 2020-07-11 NOTE — Plan of Care (Signed)
  Problem: Education: Goal: Verbalization of understanding the information provided (i.e., activity precautions, restrictions, etc) will improve Outcome: Progressing   Problem: Pain Management: Goal: Pain level will decrease Outcome: Progressing   Problem: Education: Goal: Knowledge of General Education information will improve Description: Including pain rating scale, medication(s)/side effects and non-pharmacologic comfort measures Outcome: Progressing

## 2020-07-11 NOTE — Consult Note (Signed)
Consultation Note Date: 07/11/2020   Patient Name: Cindy [name]  DOB: January 29, 1930  MRN: [id number]  Age / Sex: 86 y.o., female  PCP: Josetta Huddle, MD Referring Physician: Terrilee Croak, MD  Reason for Consultation: Establishing goals of care and Psychosocial/spiritual support  HPI/Patient Profile: 86 y.o. female   admitted on 07/07/2020 with  history of hypertension, hypothyroidism, anal cancer in remission who was brought to the ER after patient had a fall at home.  Patient was alone at the time of the fall per her daughter.  She does not use her walker and cane as recommended.  Patient lives at home alone, she has 6 hours of nursing service and much support from her family.     ED Course: In the ER patient had a CT of the pelvis which shows right-sided acetabular fracture, orthopedics consulted. After discussion with family,  that given the patient's age, her poor bone quality, underlying co-morbidities;  decision is to proceed with nonoperative management.  Today is day 3 of hospitalization and plan is for patient to discharge tomorrow to skilled nursing facility for rehabilitation and palliative services.  [facility] is SNF where patient will be transitioning.  Patient and family face treatment  option decisions, advanced directive decisions and anticipatory care needs now and into the future.     Clinical Assessment and Goals of Care:   This NP Wadie Lessen reviewed medical records, received report from team, assessed the patient and then meet at the patient's bedside with her daughter [name] to discuss diagnosis, prognosis, GOC, EOL wishes disposition and options.   Concept of Palliative Care was introduced as specialized medical care for people and their families living with serious illness.  If focuses on providing relief from the symptoms and stress of a serious illness.  The goal is to  improve quality of life for both the patient and the family.  Created space and opportunity for daughter to explore her thoughts and feelings regarding her mother's current medical situation.  She understands with the patient's advanced age the seriousness of the situation.  Comfort is a priority.   A  discussion was had today regarding advanced directives.  Concepts specific to code status, artifical feeding and hydration, continued IV antibiotics and rehospitalization was had.  The difference between a aggressive medical intervention path  and a palliative comfort care path for this patient at this time was had.  Values and goals of care important to patient and family were attempted to be elicited.   MOST form  reintroduced.  There is a copy of a completed MOST form in the ACP  Tab/dated 09-06-19 reflecting DNR/DNI and no feeding tube.  I will place order for DNR/DNI today .    Questions and concerns addressed.  Family  encouraged to call with questions or concerns.      Discussed with daughter  the importance of continued conversation with her  family and the  medical providers regarding overall plan of care and treatment options,  ensuring decisions are within the  context of the patients values and GOCs.   Patient's daughter [name] tells me she is the [facility] POA and will bring in documents tomorrow for scanning     SUMMARY OF RECOMMENDATIONS    Code Status/Advance Care Planning:  DNR-documented today   Palliative Prophylaxis:   Aspiration, Bowel Regimen, Delirium Protocol and Frequent Pain Assessment  Additional Recommendations (Limitations, Scope, Preferences):  Avoid Hospitalization, No Artificial Feeding and No Surgical Procedures  Psycho-social/Spiritual:   Desire for further Chaplaincy support:no-declined   Prognosis:   Unable to determine  Discharge Planning: [place] for rehab with Palliative care service follow-up      Primary Diagnoses: Present  on Admission: . Acetabular fracture ([place]) . Hypothyroidism . Anal cancer ([place]) . Hypertension   I have reviewed the medical record, interviewed the patient and family, and examined the patient. The following aspects are pertinent.  Past Medical History:  Diagnosis Date  . Arthritis   . Cancer ([place])    skin  . Colon cancer ([place]) 08/31/13   invasive squamous cell  . Deaf, left   . Depression   . GERD (gastroesophageal reflux disease)   . History of radiation therapy 10/10/13-11/23/13   anal canal 54GY/  . Hyperlipemia   . Hypertension   . Hypothyroidism   . PONV (postoperative nausea and vomiting)   . Skin cancer    Shingles 2012 Bad case   Social History   Socioeconomic History  . Marital status: Widowed    Spouse name: Not on file  . Number of children: Not on file  . Years of education: Not on file  . Highest education level: Not on file  Occupational History  . Not on file  Tobacco Use  . Smoking status: Never Smoker  . Smokeless tobacco: Never Used  Substance and Sexual Activity  . Alcohol use: No  . Drug use: No  . Sexual activity: Never  Other Topics Concern  . Not on file  Social History Narrative  . Not on file   Social Determinants of Health   Financial Resource Strain:   . Difficulty of Paying Living Expenses:   Food Insecurity:   . Worried About Charity fundraiser in the Last Year:   . Arboriculturist in the Last Year:   Transportation Needs:   . Film/video editor (Medical):   Marland Kitchen Lack of Transportation (Non-Medical):   Physical Activity:   . Days of Exercise per Week:   . Minutes of Exercise per Session:   Stress:   . Feeling of Stress :   Social Connections:   . Frequency of Communication with Friends and Family:   . Frequency of Social Gatherings with Friends and Family:   . Attends Religious Services:   . Active Member of Clubs or Organizations:   . Attends Archivist Meetings:   Marland Kitchen Marital Status:    Family History   Problem Relation Age of Onset  . Kidney disease Mother   . Heart attack Father   . Heart disease Father   . Cancer Sister        breast  . Cancer Sister        ovarian  . Cancer Sister        melanoma  . Cancer Daughter        breast   Scheduled Meds: . acetaminophen  1,000 mg Oral Q8H  . ALPRAZolam  0.25 mg Oral Daily  . amLODipine  5 mg Oral Daily  .  buPROPion  150 mg Oral Daily  . Chlorhexidine Gluconate Cloth  6 each Topical Q0600  . escitalopram  10 mg Oral Daily  . feeding supplement (ENSURE ENLIVE)  237 mL Oral BID BM  . heparin injection (subcutaneous)  5,000 Units Subcutaneous Q8H  . HYDROmorphone  2 mg Oral Once  . levothyroxine  88 mcg Oral QAC breakfast  . methocarbamol  500 mg Oral TID  . multivitamin with minerals  1 tablet Oral Daily  . mupirocin ointment  1 application Nasal BID  . pantoprazole  40 mg Oral Daily   Continuous Infusions: . sodium chloride 10 mL/hr at 07/09/20 1600   PRN Meds:.hydrALAZINE, HYDROmorphone (DILAUDID) injection, HYDROmorphone, naLOXone (NARCAN)  injection Medications Prior to Admission:  Prior to Admission medications   Medication Sig Start Date End Date Taking? Authorizing Provider  ALPRAZolam Duanne Moron) 0.5 MG tablet Take 0.25 mg by mouth daily.   Yes [provider]  amLODipine-valsartan (EXFORGE) 5-160 MG tablet Take 1 tablet by mouth daily.  06/18/17  Yes [provider]  buPROPion (WELLBUTRIN SR) 150 MG 12 hr tablet Take 150 mg by mouth daily.    Yes [provider]  escitalopram (LEXAPRO) 10 MG tablet Take 10 mg by mouth daily.   Yes [provider]  ibuprofen (ADVIL) 200 MG tablet Take 400 mg by mouth at bedtime as needed for fever or moderate pain.   Yes [provider]  levothyroxine (SYNTHROID, LEVOTHROID) 88 MCG tablet Take 88 mcg by mouth daily before breakfast.   Yes [provider]  ondansetron (ZOFRAN ODT) 4 MG disintegrating tablet Take 1 tablet (4 mg total) by  mouth every 8 (eight) hours as needed for nausea or vomiting. 07/06/17  Yes Sherwood Gambler, MD  pantoprazole (PROTONIX) 40 MG tablet Take 40 mg by mouth daily.   Yes [provider]  PREMARIN 0.3 MG tablet Take 0.3 mg by mouth daily. 08/14/16  Yes [provider]  ciprofloxacin (CIPRO) 500 MG tablet Take 1 tablet (500 mg total) by mouth 2 (two) times daily. Patient not taking: Reported on 07/06/2017 03/13/17   Annita Brod, MD  hydrALAZINE (APRESOLINE) 25 MG tablet Take 1 tablet (25 mg total) by mouth every 8 (eight) hours. Patient not taking: Reported on 09/12/2019 07/06/17   Sherwood Gambler, MD  metroNIDAZOLE (FLAGYL) 500 MG tablet Take 1 tablet (500 mg total) by mouth 3 (three) times daily. Patient not taking: Reported on 07/06/2017 03/13/17   Annita Brod, MD  Multiple Vitamin (MULTIVITAMIN WITH MINERALS) TABS tablet Take 1 tablet by mouth daily. Patient not taking: Reported on 09/12/2019 10/27/13   Rama, Venetia Maxon, MD  promethazine (PHENERGAN) 12.5 MG tablet Take 1 tablet (12.5 mg total) by mouth every 6 (six) hours as needed for nausea or vomiting. Patient not taking: Reported on 09/12/2019 07/06/17   Sherwood Gambler, MD   Allergies  Allergen Reactions  . Vancomycin Hives    Zosyn and vanc given together; unclear which was precipitating med. Appears to have tolerated cephalosporins in the past.  . Zosyn [Piperacillin Sod-Tazobactam So] Hives    Zosyn and vanc given together; unclear which was precipitating med. Appears to have tolerated cephalosporins in the past.  . Hydrocodone Nausea Only  . Morphine And Related Hives   Review of Systems  Unable to perform ROS: Mental status change    Physical Exam Cardiovascular:     Rate and Rhythm: Normal rate.  Pulmonary:     Effort: Pulmonary effort is normal.  Skin:  General: Skin is warm and dry.  Neurological:     Mental Status: She is alert.     Vital Signs: BP (!) 113/52 (BP Location: Left Arm)   Pulse  73   Temp 98 F (36.7 C) (Oral)   Resp 17   Ht 5\' 5"  (1.651 m)   Wt 59 kg   SpO2 98%   BMI 21.63 kg/m  Pain Scale: 0-10 POSS *See Group Information*: 1-Acceptable,Awake and alert Pain Score: 0-No pain   SpO2: SpO2: 98 % O2 Device:SpO2: 98 % O2 Flow Rate: .O2 Flow Rate (L/min): 2 L/min  IO: Intake/output summary:   Intake/Output Summary (Last 24 hours) at 07/11/2020 1319 Last data filed at 07/11/2020 1100 Gross per 24 hour  Intake 780 ml  Output 600 ml  Net 180 ml    LBM: Last BM Date: 07/10/20 Baseline Weight: Weight: 59 kg Most recent weight: Weight: 59 kg     Palliative Assessment/Data: 30 % at best   Discussed with Dr Pietro Cassis  Time In: 1400 Time Out: 1515 Time Total: 75 minutes Greater than 50%  of this time was spent counseling and coordinating care related to the above assessment and plan.  Signed by: Wadie Lessen, NP   Please contact Palliative Medicine Team phone at [phone] for questions and concerns.  For individual provider: See Shea Evans

## 2020-07-12 ENCOUNTER — Inpatient Hospital Stay (HOSPITAL_COMMUNITY): Payer: Medicare Other

## 2020-07-12 DIAGNOSIS — R509 Fever, unspecified: Secondary | ICD-10-CM | POA: Diagnosis not present

## 2020-07-12 DIAGNOSIS — R0902 Hypoxemia: Secondary | ICD-10-CM | POA: Diagnosis not present

## 2020-07-12 DIAGNOSIS — R5381 Other malaise: Secondary | ICD-10-CM | POA: Diagnosis not present

## 2020-07-12 DIAGNOSIS — R443 Hallucinations, unspecified: Secondary | ICD-10-CM | POA: Diagnosis not present

## 2020-07-12 DIAGNOSIS — R41841 Cognitive communication deficit: Secondary | ICD-10-CM | POA: Diagnosis not present

## 2020-07-12 DIAGNOSIS — F419 Anxiety disorder, unspecified: Secondary | ICD-10-CM | POA: Diagnosis not present

## 2020-07-12 DIAGNOSIS — I5020 Unspecified systolic (congestive) heart failure: Secondary | ICD-10-CM | POA: Diagnosis not present

## 2020-07-12 DIAGNOSIS — Z85828 Personal history of other malignant neoplasm of skin: Secondary | ICD-10-CM | POA: Diagnosis not present

## 2020-07-12 DIAGNOSIS — S32401D Unspecified fracture of right acetabulum, subsequent encounter for fracture with routine healing: Secondary | ICD-10-CM | POA: Diagnosis not present

## 2020-07-12 DIAGNOSIS — F0391 Unspecified dementia with behavioral disturbance: Secondary | ICD-10-CM

## 2020-07-12 DIAGNOSIS — I82551 Chronic embolism and thrombosis of right peroneal vein: Secondary | ICD-10-CM | POA: Diagnosis present

## 2020-07-12 DIAGNOSIS — Z66 Do not resuscitate: Secondary | ICD-10-CM | POA: Diagnosis present

## 2020-07-12 DIAGNOSIS — R278 Other lack of coordination: Secondary | ICD-10-CM | POA: Diagnosis not present

## 2020-07-12 DIAGNOSIS — I10 Essential (primary) hypertension: Secondary | ICD-10-CM | POA: Diagnosis not present

## 2020-07-12 DIAGNOSIS — E876 Hypokalemia: Secondary | ICD-10-CM | POA: Diagnosis not present

## 2020-07-12 DIAGNOSIS — Z760 Encounter for issue of repeat prescription: Secondary | ICD-10-CM | POA: Diagnosis not present

## 2020-07-12 DIAGNOSIS — I82451 Acute embolism and thrombosis of right peroneal vein: Secondary | ICD-10-CM | POA: Diagnosis not present

## 2020-07-12 DIAGNOSIS — R630 Anorexia: Secondary | ICD-10-CM | POA: Diagnosis not present

## 2020-07-12 DIAGNOSIS — G2581 Restless legs syndrome: Secondary | ICD-10-CM | POA: Diagnosis not present

## 2020-07-12 DIAGNOSIS — F329 Major depressive disorder, single episode, unspecified: Secondary | ICD-10-CM | POA: Diagnosis not present

## 2020-07-12 DIAGNOSIS — Z841 Family history of disorders of kidney and ureter: Secondary | ICD-10-CM | POA: Diagnosis not present

## 2020-07-12 DIAGNOSIS — I5021 Acute systolic (congestive) heart failure: Secondary | ICD-10-CM | POA: Diagnosis not present

## 2020-07-12 DIAGNOSIS — J90 Pleural effusion, not elsewhere classified: Secondary | ICD-10-CM | POA: Diagnosis not present

## 2020-07-12 DIAGNOSIS — M25521 Pain in right elbow: Secondary | ICD-10-CM | POA: Diagnosis not present

## 2020-07-12 DIAGNOSIS — I5043 Acute on chronic combined systolic (congestive) and diastolic (congestive) heart failure: Secondary | ICD-10-CM | POA: Diagnosis not present

## 2020-07-12 DIAGNOSIS — F0390 Unspecified dementia without behavioral disturbance: Secondary | ICD-10-CM | POA: Diagnosis present

## 2020-07-12 DIAGNOSIS — R279 Unspecified lack of coordination: Secondary | ICD-10-CM | POA: Diagnosis not present

## 2020-07-12 DIAGNOSIS — Z9181 History of falling: Secondary | ICD-10-CM | POA: Diagnosis not present

## 2020-07-12 DIAGNOSIS — S32401A Unspecified fracture of right acetabulum, initial encounter for closed fracture: Secondary | ICD-10-CM | POA: Diagnosis not present

## 2020-07-12 DIAGNOSIS — F411 Generalized anxiety disorder: Secondary | ICD-10-CM | POA: Diagnosis not present

## 2020-07-12 DIAGNOSIS — R410 Disorientation, unspecified: Secondary | ICD-10-CM | POA: Diagnosis not present

## 2020-07-12 DIAGNOSIS — I82401 Acute embolism and thrombosis of unspecified deep veins of right lower extremity: Secondary | ICD-10-CM | POA: Diagnosis not present

## 2020-07-12 DIAGNOSIS — Z885 Allergy status to narcotic agent status: Secondary | ICD-10-CM | POA: Diagnosis not present

## 2020-07-12 DIAGNOSIS — I2510 Atherosclerotic heart disease of native coronary artery without angina pectoris: Secondary | ICD-10-CM | POA: Diagnosis not present

## 2020-07-12 DIAGNOSIS — I2699 Other pulmonary embolism without acute cor pulmonale: Secondary | ICD-10-CM | POA: Diagnosis present

## 2020-07-12 DIAGNOSIS — R079 Chest pain, unspecified: Secondary | ICD-10-CM | POA: Diagnosis not present

## 2020-07-12 DIAGNOSIS — N179 Acute kidney failure, unspecified: Secondary | ICD-10-CM | POA: Diagnosis not present

## 2020-07-12 DIAGNOSIS — Z85048 Personal history of other malignant neoplasm of rectum, rectosigmoid junction, and anus: Secondary | ICD-10-CM | POA: Diagnosis not present

## 2020-07-12 DIAGNOSIS — Z9981 Dependence on supplemental oxygen: Secondary | ICD-10-CM | POA: Diagnosis not present

## 2020-07-12 DIAGNOSIS — R627 Adult failure to thrive: Secondary | ICD-10-CM | POA: Diagnosis not present

## 2020-07-12 DIAGNOSIS — I151 Hypertension secondary to other renal disorders: Secondary | ICD-10-CM | POA: Diagnosis present

## 2020-07-12 DIAGNOSIS — Z79899 Other long term (current) drug therapy: Secondary | ICD-10-CM | POA: Diagnosis not present

## 2020-07-12 DIAGNOSIS — I5040 Unspecified combined systolic (congestive) and diastolic (congestive) heart failure: Secondary | ICD-10-CM | POA: Diagnosis not present

## 2020-07-12 DIAGNOSIS — D649 Anemia, unspecified: Secondary | ICD-10-CM | POA: Diagnosis present

## 2020-07-12 DIAGNOSIS — J9811 Atelectasis: Secondary | ICD-10-CM | POA: Diagnosis present

## 2020-07-12 DIAGNOSIS — F03918 Unspecified dementia, unspecified severity, with other behavioral disturbance: Secondary | ICD-10-CM

## 2020-07-12 DIAGNOSIS — F29 Unspecified psychosis not due to a substance or known physiological condition: Secondary | ICD-10-CM | POA: Diagnosis not present

## 2020-07-12 DIAGNOSIS — M2550 Pain in unspecified joint: Secondary | ICD-10-CM | POA: Diagnosis not present

## 2020-07-12 DIAGNOSIS — Z8249 Family history of ischemic heart disease and other diseases of the circulatory system: Secondary | ICD-10-CM | POA: Diagnosis not present

## 2020-07-12 DIAGNOSIS — R531 Weakness: Secondary | ICD-10-CM | POA: Diagnosis not present

## 2020-07-12 DIAGNOSIS — W1830XA Fall on same level, unspecified, initial encounter: Secondary | ICD-10-CM | POA: Diagnosis present

## 2020-07-12 DIAGNOSIS — R778 Other specified abnormalities of plasma proteins: Secondary | ICD-10-CM | POA: Diagnosis not present

## 2020-07-12 DIAGNOSIS — M25531 Pain in right wrist: Secondary | ICD-10-CM | POA: Diagnosis not present

## 2020-07-12 DIAGNOSIS — Z7401 Bed confinement status: Secondary | ICD-10-CM | POA: Diagnosis not present

## 2020-07-12 DIAGNOSIS — J9601 Acute respiratory failure with hypoxia: Secondary | ICD-10-CM | POA: Diagnosis present

## 2020-07-12 DIAGNOSIS — F331 Major depressive disorder, recurrent, moderate: Secondary | ICD-10-CM | POA: Diagnosis not present

## 2020-07-12 DIAGNOSIS — M810 Age-related osteoporosis without current pathological fracture: Secondary | ICD-10-CM | POA: Diagnosis not present

## 2020-07-12 DIAGNOSIS — R52 Pain, unspecified: Secondary | ICD-10-CM | POA: Diagnosis not present

## 2020-07-12 DIAGNOSIS — Z923 Personal history of irradiation: Secondary | ICD-10-CM | POA: Diagnosis not present

## 2020-07-12 DIAGNOSIS — K5900 Constipation, unspecified: Secondary | ICD-10-CM | POA: Diagnosis not present

## 2020-07-12 DIAGNOSIS — G4700 Insomnia, unspecified: Secondary | ICD-10-CM | POA: Diagnosis not present

## 2020-07-12 DIAGNOSIS — Z743 Need for continuous supervision: Secondary | ICD-10-CM | POA: Diagnosis not present

## 2020-07-12 DIAGNOSIS — Z79890 Hormone replacement therapy (postmenopausal): Secondary | ICD-10-CM | POA: Diagnosis not present

## 2020-07-12 DIAGNOSIS — I700 Atherosclerosis of aorta: Secondary | ICD-10-CM | POA: Diagnosis not present

## 2020-07-12 DIAGNOSIS — Z881 Allergy status to other antibiotic agents status: Secondary | ICD-10-CM | POA: Diagnosis not present

## 2020-07-12 DIAGNOSIS — M6281 Muscle weakness (generalized): Secondary | ICD-10-CM | POA: Diagnosis not present

## 2020-07-12 DIAGNOSIS — Z515 Encounter for palliative care: Secondary | ICD-10-CM | POA: Diagnosis not present

## 2020-07-12 DIAGNOSIS — H903 Sensorineural hearing loss, bilateral: Secondary | ICD-10-CM | POA: Diagnosis not present

## 2020-07-12 DIAGNOSIS — R0602 Shortness of breath: Secondary | ICD-10-CM | POA: Diagnosis not present

## 2020-07-12 DIAGNOSIS — Z20822 Contact with and (suspected) exposure to covid-19: Secondary | ICD-10-CM | POA: Diagnosis present

## 2020-07-12 DIAGNOSIS — E039 Hypothyroidism, unspecified: Secondary | ICD-10-CM | POA: Diagnosis present

## 2020-07-12 DIAGNOSIS — R4182 Altered mental status, unspecified: Secondary | ICD-10-CM | POA: Diagnosis not present

## 2020-07-12 DIAGNOSIS — I2609 Other pulmonary embolism with acute cor pulmonale: Secondary | ICD-10-CM | POA: Diagnosis not present

## 2020-07-12 DIAGNOSIS — E785 Hyperlipidemia, unspecified: Secondary | ICD-10-CM | POA: Diagnosis not present

## 2020-07-12 DIAGNOSIS — M1990 Unspecified osteoarthritis, unspecified site: Secondary | ICD-10-CM | POA: Diagnosis not present

## 2020-07-12 DIAGNOSIS — Y92821 Forest as the place of occurrence of the external cause: Secondary | ICD-10-CM | POA: Diagnosis not present

## 2020-07-12 DIAGNOSIS — K219 Gastro-esophageal reflux disease without esophagitis: Secondary | ICD-10-CM | POA: Diagnosis present

## 2020-07-12 DIAGNOSIS — I82409 Acute embolism and thrombosis of unspecified deep veins of unspecified lower extremity: Secondary | ICD-10-CM | POA: Diagnosis not present

## 2020-07-12 DIAGNOSIS — S32409A Unspecified fracture of unspecified acetabulum, initial encounter for closed fracture: Secondary | ICD-10-CM | POA: Diagnosis not present

## 2020-07-12 DIAGNOSIS — R062 Wheezing: Secondary | ICD-10-CM | POA: Diagnosis not present

## 2020-07-12 DIAGNOSIS — Z85038 Personal history of other malignant neoplasm of large intestine: Secondary | ICD-10-CM | POA: Diagnosis not present

## 2020-07-12 DIAGNOSIS — S32491A Other specified fracture of right acetabulum, initial encounter for closed fracture: Secondary | ICD-10-CM | POA: Diagnosis present

## 2020-07-12 DIAGNOSIS — R404 Transient alteration of awareness: Secondary | ICD-10-CM | POA: Diagnosis not present

## 2020-07-12 LAB — CBC WITH DIFFERENTIAL/PLATELET
Abs Immature Granulocytes: 0.04 10*3/uL (ref 0.00–0.07)
Basophils Absolute: 0 10*3/uL (ref 0.0–0.1)
Basophils Relative: 1 %
Eosinophils Absolute: 0.4 10*3/uL (ref 0.0–0.5)
Eosinophils Relative: 6 %
HCT: 28.2 % — ABNORMAL LOW (ref 36.0–46.0)
Hemoglobin: 9 g/dL — ABNORMAL LOW (ref 12.0–15.0)
Immature Granulocytes: 1 %
Lymphocytes Relative: 21 %
Lymphs Abs: 1.5 10*3/uL (ref 0.7–4.0)
MCH: 32.1 pg (ref 26.0–34.0)
MCHC: 31.9 g/dL (ref 30.0–36.0)
MCV: 100.7 fL — ABNORMAL HIGH (ref 80.0–100.0)
Monocytes Absolute: 1 10*3/uL (ref 0.1–1.0)
Monocytes Relative: 13 %
Neutro Abs: 4.1 10*3/uL (ref 1.7–7.7)
Neutrophils Relative %: 58 %
Platelets: 206 10*3/uL (ref 150–400)
RBC: 2.8 MIL/uL — ABNORMAL LOW (ref 3.87–5.11)
RDW: 12.3 % (ref 11.5–15.5)
WBC: 7.1 10*3/uL (ref 4.0–10.5)
nRBC: 0 % (ref 0.0–0.2)

## 2020-07-12 LAB — BASIC METABOLIC PANEL
Anion gap: 9 (ref 5–15)
BUN: 33 mg/dL — ABNORMAL HIGH (ref 8–23)
CO2: 20 mmol/L — ABNORMAL LOW (ref 22–32)
Calcium: 8.6 mg/dL — ABNORMAL LOW (ref 8.9–10.3)
Chloride: 107 mmol/L (ref 98–111)
Creatinine, Ser: 1.29 mg/dL — ABNORMAL HIGH (ref 0.44–1.00)
GFR calc Af Amer: 43 mL/min — ABNORMAL LOW (ref >60)
GFR calc non Af Amer: 37 mL/min — ABNORMAL LOW (ref >60)
Glucose, Bld: 106 mg/dL — ABNORMAL HIGH (ref 70–99)
Potassium: 4.3 mmol/L (ref 3.5–5.1)
Sodium: 136 mmol/L (ref 135–145)

## 2020-07-12 MED ORDER — HYDROMORPHONE HCL 2 MG PO TABS: 2.0000 mg | ORAL_TABLET | ORAL | 0 refills | Status: AC | PRN

## 2020-07-12 MED ORDER — METHOCARBAMOL 500 MG PO TABS: 500.0000 mg | ORAL_TABLET | Freq: Three times a day (TID) | ORAL | 0 refills | Status: AC

## 2020-07-12 MED ORDER — ALPRAZOLAM 0.5 MG PO TABS: 0.2500 mg | ORAL_TABLET | Freq: Every day | ORAL | 0 refills | Status: AC

## 2020-07-12 MED ORDER — ENOXAPARIN SODIUM 30 MG/0.3ML ~~LOC~~ SOLN
1.0000 mg/kg | SUBCUTANEOUS | 0 refills | Status: DC
Start: 2020-07-12 — End: 2020-07-30

## 2020-07-12 MED ORDER — AMLODIPINE BESYLATE 5 MG PO TABS: 5.0000 mg | ORAL_TABLET | Freq: Every day | ORAL | Status: DC

## 2020-07-12 NOTE — Progress Notes (Signed)
Nutrition Follow-up  DOCUMENTATION CODES:   Not applicable  INTERVENTION:   -D/c Ensure Enlive po BID, each supplement provides 350 kcal and 20 grams of protein -Continue MVI with minerals daily -Magic cup TID with meals, each supplement provides 290 kcal and 9 grams of protein  NUTRITION DIAGNOSIS:   Increased nutrient needs related to acute illness as evidenced by estimated needs.  Ongoing  GOAL:   Patient will meet greater than or equal to 90% of their needs  Progressing   MONITOR:   PO intake, Supplement acceptance, Labs, Weight trends, Skin, I & O's  REASON FOR ASSESSMENT:   Consult Assessment of nutrition requirement/status  ASSESSMENT:   Cindy [name] is a 86 y.o. female with history of hypertension, hypothyroidism, anal cancer in remission who was brought to the ER after patient had a fall at home.  Patient states she was in the ER when she tripped and fell and hit her back.  Denies hitting her head or losing consciousness.  Reviewed I/O's: -790 ml x 24 hours and +638 ml since admission  UOP: 1.2 L x 24 hours  Attempted to speak with pt via phone, however, no answer.   Pt with poor appetite; noted meal completion 25%. Pt is refusing Ensure supplements, as she does not like them.   Palliative care following; pt has decided not to undergo surgery. Plan to d/c to SNF for rehab once medically stable.   Medications reviewed and include 0.9% sodium chloride infusion @ 50 ml/hr.  Labs reviewed: Na: 133.   Diet Order:   Diet Order            Diet regular Room service appropriate? Yes; Fluid consistency: Thin  Diet effective now                 EDUCATION NEEDS:   Education needs have been addressed  Skin:  Skin Assessment: Reviewed RN Assessment  Last BM:  07/10/20  Height:   Ht Readings from Last 1 Encounters:  07/07/20 5\' 5"  (1.651 m)    Weight:   Wt Readings from Last 1 Encounters:  07/07/20 59 kg    Ideal Body Weight:  56.8  kg  BMI:  Body mass index is 21.63 kg/m.  Estimated Nutritional Needs:   Kcal:  1500-1700  Protein:  70-85 grams  Fluid:  > 1.5 L    Loistine Chance, RD, LDN, [place] Registered Dietitian II Certified Diabetes Care and Education Specialist Please refer to [facility] for RD and/or RD on-call/weekend/after hours pager

## 2020-07-12 NOTE — Progress Notes (Signed)
Patient ID: Cindy [name], female   DOB: 02/20/1930, 86 y.o.   MRN: [id number]  This NP visited patient at the bedside as a follow up to  yesterday's GOCs meeting, for palliative medicine needs and emotional support.  Met with patient's daughter Sharon Cupit as scheduled for continued conversation regarding current medical situation, advance care planning, and anticipatory care needs.  Daughter presented copies of healthcare power of attorney, advanced directive and declaration for a natural death.  Copies made and placed in hard chart.  Plan is for patient to discharge today to skilled nursing facility for rehab.  Patient remains weak and intermittently confused.  Daughter versus her concerns regarding her mother's ability to rehab long-term.  Education offered regarding concept specific to human mortality and adult failure to thrive.  Education offered on the limitations of medical interventions to provide quality of life when a body fails to thrive.  Education offered on the difference between an aggressive medical intervention path and a palliative comfort path.  Reference made to patient's declaration for a natural death.  A hard choices booklet was left for review.  Daughter verbalizes appreciation for visit.  Recommend outpatient community-based palliative to follow on discharge.  Discussed with family the importance of continued conversation with the  medical providers regarding overall plan of care and treatment options,  ensuring decisions are within the context of the patients values and GOCs.  Questions and concerns addressed    Total time spent on the unit was  25 minutes  Greater than 50% of the time was spent in counseling and coordination of care    NP  Palliative Medicine Team Team Phone # [phone] Pager [phone]   

## 2020-07-12 NOTE — Progress Notes (Signed)
Physical Therapy Treatment Patient Details Name: Cindy [name] MRN: [id number] DOB: 1930/11/18 Today's Date: 07/12/2020    History of Present Illness Patient is a 86 y/o female who presents with complicated right acetabular fx s/p fall. At this time, being managed conservatively. PMH includes rectal ca, HTN, HLD.    PT Comments    Patient able to achieve OOB to chair, but needing +2 total A.  She is rigid throughout and has difficulty sitting on R hip at EOB leaning L and posteriorly needing up to max A for sitting balance.  Demonstrates heel cord stiffness as well which would make standing transfers difficult.  Nursing informed to use lift for back to bed.  Left sitting upright in recliner with feet supported for heel cord stretch and proprioceptive input into heels.  Continue to recommend SNF level rehab at d/c.    Follow Up Recommendations  SNF;Supervision for mobility/OOB;Supervision/Assistance - 24 hour     Equipment Recommendations  None recommended by PT    Recommendations for Other Services       Precautions / Restrictions Precautions Precautions: Fall Precaution Comments: R LE TDWB for trs Restrictions RLE Weight Bearing: Touchdown weight bearing    Mobility  Bed Mobility Overal bed mobility: Needs Assistance Bed Mobility: Supine to Sit     Supine to sit: HOB elevated;Total assist;+2 for physical assistance     General bed mobility comments: assist to bring legs off bed and to lift trunk and scoot hips, pt staying rather rigid and eyes closed most of the time  Transfers Overall transfer level: Needs assistance Equipment used: None Transfers: Lateral/Scoot Transfers          Lateral/Scoot Transfers: Total assist;+2 physical assistance General transfer comment: scooted bed to recliner via bed pad, did not attempt to stand as pt leaning posterior and to L and with tight heel cords unable to get feet flat  Ambulation/Gait                 Stairs              Wheelchair Mobility    Modified Rankin (Stroke Patients Only)       Balance Overall balance assessment: Needs assistance   Sitting balance-Leahy Scale: Poor Sitting balance - Comments: progressing from min A to max A as pt fatigued sitting EOB Postural control: Left lateral lean;Posterior lean                                  Cognition Arousal/Alertness: Lethargic Behavior During Therapy: Flat affect Overall Cognitive Status: Impaired/Different from baseline Area of Impairment: Orientation;Memory;Attention;Following commands;Problem solving                 Orientation Level: Disoriented to;Situation;Time Current Attention Level: Focused Memory: Decreased short-term memory;Decreased recall of precautions Following Commands: Follows one step commands inconsistently;Follows one step commands with increased time     Problem Solving: Slow processing;Decreased initiation;Difficulty sequencing;Requires verbal cues;Requires tactile cues General Comments: Patient keeping eyes closed during most of session.  Only answering some questions.  Difficulty keeping attention for more than ~10 seconds; daughter reports due to pain medication      Exercises General Exercises - Lower Extremity Ankle Circles/Pumps: AAROM;10 reps;Both (with max cues) Heel Slides: AAROM;Both;5 reps;Supine    General Comments        Pertinent Vitals/Pain Pain Assessment: Faces Faces Pain Scale: Hurts whole lot Pain Location: right hip with movement Pain  Descriptors / Indicators: Grimacing;Guarding;Moaning Pain Intervention(s): Monitored during session;Repositioned;Premedicated before session    Home Living                      Prior Function            PT Goals (current goals can now be found in the care plan section) Progress towards PT goals: Progressing toward goals    Frequency    Min 2X/week      PT Plan Current plan remains appropriate     Co-evaluation              AM-PAC PT "6 Clicks" Mobility   Outcome Measure  Help needed turning from your back to your side while in a flat bed without using bedrails?: Total Help needed moving from lying on your back to sitting on the side of a flat bed without using bedrails?: Total Help needed moving to and from a bed to a chair (including a wheelchair)?: Total Help needed standing up from a chair using your arms (e.g., wheelchair or bedside chair)?: Total Help needed to walk in hospital room?: Total Help needed climbing 3-5 steps with a railing? : Total 6 Click Score: 6    End of Session Equipment Utilized During Treatment: Oxygen Activity Tolerance: Patient limited by pain Patient left: in chair;with call bell/phone within reach;with chair alarm set;with family/visitor present Nurse Communication: Mobility status;Need for lift equipment PT Visit Diagnosis: Pain;Muscle weakness (generalized) (M62.81);Difficulty in walking, not elsewhere classified (R26.2) Pain - Right/Left: Right Pain - part of body: Hip     Time: 8333-8329 PT Time Calculation (min) (ACUTE ONLY): 25 min  Charges:  $Therapeutic Exercise: 8-22 mins $Therapeutic Activity: 8-22 mins                     Magda Kiel, PT Acute Rehabilitation Services [phone] [phone] 07/12/2020    Cindy [name] 07/12/2020, 3:00 PM

## 2020-07-12 NOTE — Progress Notes (Addendum)
Discharge paperwork in drawer for PTAR to give to receiving facility. Called receiving facility, report given. Pt not in distress and tolerated well.

## 2020-07-12 NOTE — Progress Notes (Signed)
PROGRESS NOTE  Cindy [name]  DOB: 09/20/1930  PCP: Josetta Huddle, MD NUU:[id number]  DOA: 07/07/2020  LOS: 5 days   Chief Complaint  Patient presents with  . Fall   Brief narrative: Cindy [name] is a 86 y.o. female with history of hypertension, hypothyroidism, anal cancer in remission. Patient was brought to the ED on 7/17 after a fall in her yard leading to right hip pain  In the ER patient had a CT of the pelvis which shows right-sided acetabular fracture.  Labs are remarkable for WBC count of 14.4 hemoglobin 11.8 creatinine 1.2.  Covid test was negative. Patient was admitted to hospitalist service.  Orthopedics Dr. Marlou Sa was consulted.   Subjective: Patient was seen and examined this morning.   Lying down in bed.  Daughter at bedside. Patient does not have any pain at rest but she is not participating with physical therapy in anticipation of pain. Per orthopedics team, patient cannot be discharged to rehab until she has appropriate evaluation by PT here.  If she is not mobile at all, she may be require surgical excision as well. Labs from this morning showed improving creatinine, down to 1.29 today.  Assessment/Plan: Right-sided acetabular fracture -After a fall -Orthopedics consult appreciated.  Initially recommended for conservative/nonsurgical management and ambulation with PT.  However patient has not been able to work with PT at all.  PT reconsulted today.  Per orthopedics, if she cannot work with PT at all, see me with need surgery.   -On Lovenox for DVT prophylaxis. -Pain management per orthopedics.  Acute renal failure -Likely from combination of dehydration and ACE inhibitor.  -Creatinine 1.1 on admission.  Creatinine trended up to peak at 2.40 on 7/20, down to 1.29 today with hydration. -Continue gentle IV hydration.  Hypertension  -Home meds include amlodipine 5 mg daily, valsartan 160 mg daily.  Amlodipine resumed.  Valsartan on hold because of AKI.   IV hydralazine as needed.  Hypothyroidism  -resume Synthroid  Anemia  -Chronic and stable.    Acute respiratory failure with hypoxia -Not on supplemental oxygen at home. Currently requiring 2 L/min. No fever, no WBC count.  -No cough or other respiratory symptoms. Likely atelectasis due to immobility related to oxygen dependence. -Incentive spirometry encouraged again.  Long-term prognosis -Patient seems to have guarded prognosis because of fracture, inability to get surgery, anticipated bedbound status for next 6 to 8 weeks and high likelihood of complications related to her age and dementia. -I have had long conversation with patient and her daughter at bedside and multiple occasions about this. -Palliative care consultation called.  Mobility: PT evaluation to continue. Code Status:   Code Status: DNR  Nutritional status: Body mass index is 21.63 kg/m. Nutrition Problem: Increased nutrient needs Etiology: acute illness Signs/Symptoms: estimated needs Diet Order            Diet regular Room service appropriate? Yes; Fluid consistency: Thin  Diet effective now                 DVT prophylaxis: heparin injection 5,000 Units Start: 07/10/20 1300 SCDs Start: 07/07/20 2031   Antimicrobials:  None Fluid: Normal saline at 50 mL per hr  Consultants: Orthopedics Family Communication:  Daughter at bedside  Status is: Inpatient  Remains inpatient appropriate because:Ongoing active pain requiring inpatient pain management and IV treatments appropriate due to intensity of illness or inability to take PO May need surgery orthopedics.  Dispo: The patient is from: Home  Anticipated d/c is to: SNF              Anticipated d/c date is: 2 days              Patient currently is not medically stable to d/c.   Infusions:  . sodium chloride 50 mL/hr at 07/09/20 1600    Scheduled Meds: . acetaminophen  1,000 mg Oral Q8H  . ALPRAZolam  0.25 mg Oral Daily  . amLODipine   5 mg Oral Daily  . buPROPion  150 mg Oral Daily  . escitalopram  10 mg Oral Daily  . feeding supplement (ENSURE ENLIVE)  237 mL Oral BID BM  . heparin injection (subcutaneous)  5,000 Units Subcutaneous Q8H  . HYDROmorphone  2 mg Oral Once  . levothyroxine  88 mcg Oral QAC breakfast  . methocarbamol  500 mg Oral TID  . multivitamin with minerals  1 tablet Oral Daily  . mupirocin ointment  1 application Nasal BID  . pantoprazole  40 mg Oral Daily    Antimicrobials: Anti-infectives (From admission, onward)   None      PRN meds: hydrALAZINE, HYDROmorphone (DILAUDID) injection, HYDROmorphone, naLOXone (NARCAN)  injection   Objective: Vitals:   07/12/20 0300 07/12/20 0724  BP: 112/61 (!) 125/55  Pulse: 76 76  Resp: 15 16  Temp: 98.3 F (36.8 C) 98.8 F (37.1 C)  SpO2: 91% 98%    Intake/Output Summary (Last 24 hours) at 07/12/2020 1303 Last data filed at 07/12/2020 0820 Gross per 24 hour  Intake 480 ml  Output 1150 ml  Net -670 ml   Filed Weights   07/07/20 1642  Weight: 59 kg   Weight change:  Body mass index is 21.63 kg/m.   Physical Exam: General exam: Appears calm and comfortable.  Not in distress from pain at rest.   Skin: No rashes, lesions or ulcers. HEENT: Atraumatic, normocephalic, supple neck, no obvious bleeding Lungs: Diminished air entry in bases, no crackles or wheezing CVS: Regular rate and rhythm, no murmur GI/Abd soft, nontender, nondistended, bowel sound present CNS: Alert, awake oriented to place and person Psychiatry: Mood appropriate Extremities: No pedal edema, no calf tenderness  Data Review: I have personally reviewed the laboratory data and studies available.  Recent Labs  Lab 07/07/20 1802 07/09/20 0159 07/10/20 0433 07/11/20 0308 07/12/20 0241  WBC 14.4* 9.1 9.2 9.3 7.1  NEUTROABS 12.3* 6.5  --  6.8 4.1  HGB 11.8* 9.9* 9.0* 8.4* 9.0*  HCT 36.6 30.9* 28.1* 26.0* 28.2*  MCV 98.4 99.7 100.4* 98.9 100.7*  PLT 263 201 178 179 206    Recent Labs  Lab 07/07/20 1802 07/09/20 0159 07/10/20 0433 07/11/20 0308 07/12/20 0241  NA 141 135  --  133* 136  K 3.5 3.9  --  3.9 4.3  CL 109 103  --  103 107  CO2 19* 23  --  23 20*  GLUCOSE 123* 141*  --  134* 106*  BUN 17 35*  --  40* 33*  CREATININE 1.21* 1.87* 2.40* 1.60* 1.29*  CALCIUM 9.3 8.7*  --  8.7* 8.6*    Signed, Terrilee Croak, MD [facility] Pager: [phone] (Secure Chat preferred). 07/12/2020

## 2020-07-12 NOTE — Discharge Instructions (Signed)
Orthopaedic Trauma Service Discharge Instructions   General Discharge Instructions  Orthopaedic Injuries:  Right acetabulum fracture   WEIGHT BEARING STATUS: Touch Down Weightbearing R leg   RANGE OF MOTION/ACTIVITY: activity as tolerated while maintaining touch-down weightbearing.  Up with assistance. It is critical to get pt up daily to the chair to be in seated position.  Participate with therapy daily.  Can get pt a stationary peddler to exercise as well   Bone health:  Continue with vitamin d supplements. Will need DEXA as outpatient   Wound Care:n/a  DVT/PE prophylaxis: Lovenox subcutaneous injection daily x 30 days   Diet: as you were eating previously.  Can use over the counter stool softeners and bowel preparations, such as Miralax, to help with bowel movements.  Narcotics can be constipating.  Be sure to drink plenty of fluids  PAIN MEDICATION USE AND EXPECTATIONS  You have likely been given narcotic medications to help control your pain.  After a traumatic event that results in an fracture (broken bone) with or without surgery, it is ok to use narcotic pain medications to help control one's pain.  We understand that everyone responds to pain differently and each individual patient will be evaluated on a regular basis for the continued need for narcotic medications. Ideally, narcotic medication use should last no more than 6-8 weeks (coinciding with fracture healing).   As a patient it is your responsibility as well to monitor narcotic medication use and report the amount and frequency you use these medications when you come to your office visit.   We would also advise that if you are using narcotic medications, you should take a dose prior to therapy to maximize you participation.  IF YOU ARE ON NARCOTIC MEDICATIONS IT IS NOT PERMISSIBLE TO OPERATE A MOTOR VEHICLE (MOTORCYCLE/CAR/TRUCK/MOPED) OR HEAVY MACHINERY DO NOT MIX NARCOTICS WITH OTHER CNS (CENTRAL NERVOUS SYSTEM)  DEPRESSANTS SUCH AS ALCOHOL   STOP SMOKING OR USING NICOTINE PRODUCTS!!!!  As discussed nicotine severely impairs your body's ability to heal surgical and traumatic wounds but also impairs bone healing.  Wounds and bone heal by forming microscopic blood vessels (angiogenesis) and nicotine is a vasoconstrictor (essentially, shrinks blood vessels).  Therefore, if vasoconstriction occurs to these microscopic blood vessels they essentially disappear and are unable to deliver necessary nutrients to the healing tissue.  This is one modifiable factor that you can do to dramatically increase your chances of healing your injury.    (This means no smoking, no nicotine gum, patches, etc)  DO NOT USE NONSTEROIDAL ANTI-INFLAMMATORY DRUGS (NSAID'S)  Using products such as Advil (ibuprofen), Aleve (naproxen), Motrin (ibuprofen) for additional pain control during fracture healing can delay and/or prevent the healing response.  If you would like to take over the counter (OTC) medication, Tylenol (acetaminophen) is ok.  However, some narcotic medications that are given for pain control contain acetaminophen as well. Therefore, you should not exceed more than 4000 mg of tylenol in a day if you do not have liver disease.  Also note that there are may OTC medicines, such as cold medicines and allergy medicines that my contain tylenol as well.  If you have any questions about medications and/or interactions please ask your doctor/PA or your pharmacist.      ICE AND ELEVATE INJURED/OPERATIVE EXTREMITY  Using ice and elevating the injured extremity above your heart can help with swelling and pain control.  Icing in a pulsatile fashion, such as 20 minutes on and 20 minutes off, can be  followed.    Do not place ice directly on skin. Make sure there is a barrier between to skin and the ice pack.    Using frozen items such as frozen peas works well as the conform nicely to the are that needs to be iced.  USE AN ACE WRAP OR TED  HOSE FOR SWELLING CONTROL  In addition to icing and elevation, Ace wraps or TED hose are used to help limit and resolve swelling.  It is recommended to use Ace wraps or TED hose until you are informed to stop.    When using Ace Wraps start the wrapping distally (farthest away from the body) and wrap proximally (closer to the body)   Example: If you had surgery on your leg or thing and you do not have a splint on, start the ace wrap at the toes and work your way up to the thigh        If you had surgery on your upper extremity and do not have a splint on, start the ace wrap at your fingers and work your way up to the upper arm  IF YOU ARE IN A SPLINT OR CAST DO NOT [place]   If your splint gets wet for any reason please contact the office immediately. You may shower in your splint or cast as long as you keep it dry.  This can be done by wrapping in a cast cover or garbage back (or similar)  Do Not stick any thing down your splint or cast such as pencils, money, or hangers to try and scratch yourself with.  If you feel itchy take benadryl as prescribed on the bottle for itching  IF YOU ARE IN A CAM BOOT (BLACK BOOT)  You may remove boot periodically. Perform daily dressing changes as noted below.  Wash the liner of the boot regularly and wear a sock when wearing the boot. It is recommended that you sleep in the boot until told otherwise    Call office for the following:  Temperature greater than 101F  Persistent nausea and vomiting  Severe uncontrolled pain  Redness, tenderness, or signs of infection (pain, swelling, redness, odor or green/yellow discharge around the site)  Difficulty breathing, headache or visual disturbances  Hives  Persistent dizziness or light-headedness  Extreme fatigue  Any other questions or concerns you may have after discharge  In an emergency, call 911 or go to an Emergency Department at a nearby hospital    [place]: [phone]   VISIT OUR WEBSITE FOR ADDITIONAL INFORMATION: [email or web address]

## 2020-07-12 NOTE — Plan of Care (Signed)
  Problem: Education: Goal: Verbalization of understanding the information provided (i.e., activity precautions, restrictions, etc) will improve Outcome: Progressing Goal: Individualized Educational Video(s) Outcome: Progressing   Problem: Education: Goal: Verbalization of understanding the information provided (i.e., activity precautions, restrictions, etc) will improve Outcome: Progressing Goal: Individualized Educational Video(s) Outcome: Progressing   Problem: Clinical Measurements: Goal: Postoperative complications will be avoided or minimized Outcome: Progressing   Problem: Self-Concept: Goal: Ability to maintain and perform role responsibilities to the fullest extent possible will improve Outcome: Progressing   Problem: Pain Management: Goal: Pain level will decrease Outcome: Progressing   Problem: Education: Goal: Knowledge of General Education information will improve Description: Including pain rating scale, medication(s)/side effects and non-pharmacologic comfort measures Outcome: Progressing

## 2020-07-12 NOTE — TOC Transition Note (Signed)
Transition of Care [facility]) - CM/SW Discharge Note   Patient Details  Name: LEE-ANNE FLICKER MRN: [id number] Date of Birth: 17-Aug-1930  [facility] CM/SW Contact:  Curlene Labrum, RN Phone Number: 07/12/2020, 4:26 PM   Clinical Narrative:    Case management spoke with the patient and daughter today and the patient will be transferred to [facility].  PTAR was called and Murray Hodgkins, RN will call report to [facility] at [phone] Room 38.   Final next level of care: Skilled Nursing Facility Barriers to Discharge: Continued Medical Work up   Patient Goals and CMS Choice Patient states their goals for this hospitalization and ongoing recovery are:: Transfer to SNF and then home. CMS Medicare.gov Compare Post Acute Care list provided to:: Patient Choice offered to / list presented to : Patient, Adult Children  Discharge Placement                       Discharge Plan and Services In-house Referral: Chaplain Discharge Planning Services: CM Consult Post Acute Care Choice: [place]          DME Arranged:  (currently has WC, shower chair, and RW)                    Social Determinants of Health (SDOH) Interventions     Readmission Risk Interventions Readmission Risk Prevention Plan 07/10/2020  Transportation Screening Complete  PCP or Specialist Appt within 5-7 Days Complete  Home Care Screening Complete  Medication Review (RN CM) Complete  Some recent data might be hidden

## 2020-07-12 NOTE — Plan of Care (Signed)

## 2020-07-12 NOTE — Discharge Summary (Signed)
Physician Discharge Summary  CAMBREA KIRT RSW:[id number] DOB: 1930/07/21 DOA: 07/07/2020  PCP: Josetta Huddle, MD  Admit date: 07/07/2020 Discharge date: 07/12/2020  Admitted From: Home Discharge disposition: SNF rehab   Code Status: DNR  Diet Recommendation: Cardiac diet   Recommendations for Outpatient Follow-Up:   1. Follow-up with PCP as an outpatient 2. Follow-up with orthopedics as an outpatient 3. Follow-up with palliative care as an outpatient  Discharge Diagnosis:   Principal Problem:   Acetabular fracture ([place]) Active Problems:   Weakness generalized   Hypothyroidism   Hypertension   Anal cancer ([place])   DNR (do not resuscitate)   Palliative care by specialist   Adult failure to thrive   Dementia without behavioral disturbance ([place])  History of Present Illness / Brief narrative:  Cindy H Hofstetteris a 86 y.o.femalewithhistory of hypertension, hypothyroidism, anal cancer in remission. Patient was brought to the ED on 7/17 after a fall in her yard leading to right hip pain  In the ER patient had a CT of the pelvis which shows right-sided acetabular fracture.  Labs are remarkable for WBC count of 14.4 hemoglobin 11.8 creatinine 1.2. Covid test was negative. Patient was admitted to hospitalist service.  Orthopedics Dr. Marlou Sa was consulted.   Hospital Course:  Right-sided acetabular fracture -After a fall -Orthopedics consult appreciated.  Based on patient's advanced age, poor bone quality and limited ability to achieve and maintain anatomic reduction, nonoperative management is recommended.  -Per orthopedics, patient will be touchdown weightbearing on the right leg for 8 weeks and then she will need to be bed to chair for 6 to 8 weeks.   -On Lovenox for DVT prophylaxis. -Discharged with oral pain medicines  Acute renal failure -Likely from combination of dehydration and ACE inhibitor.  -Creatinine 1.1 on admission.  Creatinine trended up to peak at  2.40 on 7/20, down to 1.29 today with hydration. -Encourage oral hydration  Hypertension  -Home meds include amlodipine 5 mg daily, valsartan 160 mg daily.  -Currently on amlodipine only and blood pressure is controlled.  Continue to hold valsartan.  Hypothyroidism  -resume Synthroid  Anemia  -Chronic and stable.    Acute respiratory failure with hypoxia -Not on supplemental oxygen at home. Currently requiring 2 L/min. No fever, no WBC count.  -No cough or other respiratory symptoms. Likely atelectasis due to immobility related to oxygen dependence. -Incentive spirometry encouraged again.  Long-term prognosis -Patient seems to have guarded prognosis because of fracture, inability to get surgery, anticipated bedbound status for next 6 to 8 weeks and high likelihood of complications related to her age and dementia. -I have had long conversation with patient and her daughter at bedside and multiple occasions about this. -Palliative care consultation was obtained. -Outpatient palliative follow-up recommended.  Mobility: PT evaluation to be tried again today Code Status:  DNR/DNI  Wound care:    Subjective:  Patient was seen and examined this morning.   Lying down in bed.  Daughter at bedside. Patient does not have any pain at rest but she is not participating with physical therapy in anticipation of pain. Labs from this morning showed improving creatinine, down to 1.29 today.  Discharge Exam:   Vitals:   07/11/20 1945 07/12/20 0300 07/12/20 0724 07/12/20 1512  BP: (!) 114/55 112/61 (!) 125/55 (!) 123/57  Pulse: 71 76 76 63  Resp: 16 15 16 16   Temp: 98.1 F (36.7 C) 98.3 F (36.8 C) 98.8 F (37.1 C) 98.7 F (37.1 C)  TempSrc: Axillary  Axillary Oral Oral  SpO2: 94% 91% 98% 100%  Weight:      Height:        Body mass index is 21.63 kg/m.  General exam: Appears calm and comfortable.  Not in distress from pain at rest.   Skin: No rashes, lesions or ulcers. HEENT:  Atraumatic, normocephalic, supple neck, no obvious bleeding Lungs: Diminished air entry in bases, no crackles or wheezing CVS: Regular rate and rhythm, no murmur GI/Abd soft, nontender, nondistended, bowel sound present CNS: Alert, awake oriented to place and person Psychiatry: Mood appropriate Extremities: No pedal edema, no calf tenderness  Discharge Instructions:   Discharge Instructions    Diet - low sodium heart healthy   Complete by: As directed    Increase activity slowly   Complete by: As directed       Follow-up Information    Josetta Huddle, MD Follow up.   Specialty: Internal Medicine Contact information: [address] [place] [address] [phone]        Altamese Timnath, MD Follow up.   Specialty: Orthopedic Surgery Contact information: [place] [address] [phone]              Allergies as of 07/12/2020      Reactions   Vancomycin Hives   Zosyn and vanc given together; unclear which was precipitating med. Appears to have tolerated cephalosporins in the past.   Zosyn [piperacillin Sod-tazobactam So] Hives   Zosyn and vanc given together; unclear which was precipitating med. Appears to have tolerated cephalosporins in the past.   Hydrocodone Nausea Only   Morphine And Related Hives      Medication List    STOP taking these medications   amLODipine-valsartan 5-160 MG tablet Commonly known as: EXFORGE   ciprofloxacin 500 MG tablet Commonly known as: CIPRO   hydrALAZINE 25 MG tablet Commonly known as: APRESOLINE   ibuprofen 200 MG tablet Commonly known as: ADVIL   metroNIDAZOLE 500 MG tablet Commonly known as: FLAGYL   multivitamin with minerals Tabs tablet   promethazine 12.5 MG tablet Commonly known as: PHENERGAN     TAKE these medications   ALPRAZolam 0.5 MG tablet Commonly known as: XANAX Take 0.5 tablets (0.25 mg total) by mouth daily for 5 days.   amLODipine 5 MG tablet Commonly  known as: NORVASC Take 1 tablet (5 mg total) by mouth daily. Start taking on: July 13, 2020   buPROPion 150 MG 12 hr tablet Commonly known as: WELLBUTRIN SR Take 150 mg by mouth daily.   escitalopram 10 MG tablet Commonly known as: LEXAPRO Take 10 mg by mouth daily.   HYDROmorphone 2 MG tablet Commonly known as: DILAUDID Take 1 tablet (2 mg total) by mouth every 4 (four) hours as needed for up to 3 days for moderate pain or severe pain.   levothyroxine 88 MCG tablet Commonly known as: SYNTHROID Take 88 mcg by mouth daily before breakfast.   methocarbamol 500 MG tablet Commonly known as: ROBAXIN Take 1 tablet (500 mg total) by mouth 3 (three) times daily for 3 days.   ondansetron 4 MG disintegrating tablet Commonly known as: Zofran ODT Take 1 tablet (4 mg total) by mouth every 8 (eight) hours as needed for nausea or vomiting.   pantoprazole 40 MG tablet Commonly known as: PROTONIX Take 40 mg by mouth daily.   Premarin 0.3 MG tablet Generic drug: estrogens (conjugated) Take 0.3 mg by mouth daily.      Time  coordinating discharge: 35 minutes  The results of significant diagnostics from this hospitalization (including imaging, microbiology, ancillary and laboratory) are listed below for reference.    Procedures and Diagnostic Studies:   DG Chest 1 View  Result Date: 07/07/2020 CLINICAL DATA:  Patient fell earlier today while outside at home. EXAM: CHEST  1 VIEW COMPARISON:  Chest radiograph 03/09/2017 FINDINGS: Stable cardiomediastinal contours. Low lung volumes. No focal consolidation. No pneumothorax or large pleural effusion. No acute finding in the visualized skeleton. IMPRESSION: No acute cardiopulmonary finding. Electronically Signed   By: Audie Pinto M.D.   On: 07/07/2020 18:11   CT PELVIS WO CONTRAST  Result Date: 07/07/2020 CLINICAL DATA:  Right pelvic/is tabular fracture. EXAM: CT PELVIS WITHOUT CONTRAST TECHNIQUE: Multidetector CT imaging of the pelvis  was performed following the standard protocol without intravenous contrast. COMPARISON:  CT of the abdomen and pelvis June 13, 2003 FINDINGS: Urinary Tract: There is fat stranding surrounding the urinary bladder, but the urinary bladder itself is intact. Bowel:  Unremarkable visualized pelvic bowel loops. Vascular/Lymphatic: No pathologically enlarged lymph nodes. No significant vascular abnormality seen. Reproductive: No mass or other significant abnormality, post hysterectomy. Other: Fat stranding and small fluid collection within the right pelvis and along the right lower abdominal and pelvic wall. Musculoskeletal: The visualized portion of the right humerus is intact. There is a comminuted triplane displaced anterior and posterior right acetabular fracture. The right humeral head is normally located within the fractured acetabulum. Several fracture lines extend intra-articularly into the right hip joint. The remainder of the pelvic bones are intact. IMPRESSION: 1. Comminuted triplane displaced anterior and posterior right acetabular fracture. Several fracture lines extend intra-articularly into the right hip joint. 2. Fat stranding and small fluid collection within the right pelvis and along the right lower abdominal and pelvic wall. 3. Fat stranding surrounding the urinary bladder, but the urinary bladder itself is intact. Electronically Signed   By: Fidela Salisbury M.D.   On: 07/07/2020 19:39   DG Hip Unilat W or Wo Pelvis 2-3 Views Right  Result Date: 07/07/2020 CLINICAL DATA:  Patient fell earlier today while outside at home. EXAM: DG HIP (WITH OR WITHOUT PELVIS) 2-3V RIGHT COMPARISON:  CT abdomen pelvis 07/06/2017 FINDINGS: Osteopenia. There is disruption of the iliopectineal line consistent with a displaced acetabular fracture. There is associated medial migration of the femoral head. The femoral head and proximal femur appear intact. IMPRESSION: Displaced fracture of the right acetabulum.  Recommend CT pelvis for full characterization. Electronically Signed   By: Audie Pinto M.D.   On: 07/07/2020 18:10     Labs:   Basic Metabolic Panel: Recent Labs  Lab 07/07/20 1802 07/07/20 1802 07/09/20 0159 07/09/20 0159 07/10/20 0433 07/11/20 0308 07/12/20 0241  NA 141  --  135  --   --  133* 136  K 3.5   < > 3.9   < >  --  3.9 4.3  CL 109  --  103  --   --  103 107  CO2 19*  --  23  --   --  23 20*  GLUCOSE 123*  --  141*  --   --  134* 106*  BUN 17  --  35*  --   --  40* 33*  CREATININE 1.21*  --  1.87*  --  2.40* 1.60* 1.29*  CALCIUM 9.3  --  8.7*  --   --  8.7* 8.6*   < > = values in this interval not displayed.  GFR Estimated Creatinine Clearance: 27.1 mL/min (A) (by C-G formula based on SCr of 1.29 mg/dL (H)). Liver Function Tests: No results for input(s): AST, ALT, ALKPHOS, BILITOT, PROT, ALBUMIN in the last 168 hours. No results for input(s): LIPASE, AMYLASE in the last 168 hours. No results for input(s): AMMONIA in the last 168 hours. Coagulation profile Recent Labs  Lab 07/07/20 1802  INR 1.1    CBC: Recent Labs  Lab 07/07/20 1802 07/09/20 0159 07/10/20 0433 07/11/20 0308 07/12/20 0241  WBC 14.4* 9.1 9.2 9.3 7.1  NEUTROABS 12.3* 6.5  --  6.8 4.1  HGB 11.8* 9.9* 9.0* 8.4* 9.0*  HCT 36.6 30.9* 28.1* 26.0* 28.2*  MCV 98.4 99.7 100.4* 98.9 100.7*  PLT 263 201 178 179 206   Cardiac Enzymes: No results for input(s): CKTOTAL, CKMB, CKMBINDEX, TROPONINI in the last 168 hours. BNP: Invalid input(s): POCBNP CBG: No results for input(s): GLUCAP in the last 168 hours. D-Dimer No results for input(s): DDIMER in the last 72 hours. Hgb A1c No results for input(s): HGBA1C in the last 72 hours. Lipid Profile No results for input(s): CHOL, HDL, LDLCALC, TRIG, CHOLHDL, LDLDIRECT in the last 72 hours. Thyroid function studies No results for input(s): TSH, T4TOTAL, T3FREE, THYROIDAB in the last 72 hours.  Invalid input(s): FREET3 Anemia work up No  results for input(s): VITAMINB12, FOLATE, FERRITIN, TIBC, IRON, RETICCTPCT in the last 72 hours. Microbiology Recent Results (from the past 240 hour(s))  SARS Coronavirus 2 by RT PCR (hospital order, performed in [facility] hospital lab) Nasopharyngeal Nasopharyngeal Swab     Status: None   Collection Time: 07/07/20  7:53 PM   Specimen: Nasopharyngeal Swab  Result Value Ref Range Status   SARS Coronavirus 2 NEGATIVE NEGATIVE Final    Comment: (NOTE) SARS-CoV-2 target nucleic acids are NOT DETECTED.  The SARS-CoV-2 RNA is generally detectable in upper and lower respiratory specimens during the acute phase of infection. The lowest concentration of SARS-CoV-2 viral copies this assay can detect is 250 copies / mL. A negative result does not preclude SARS-CoV-2 infection and should not be used as the sole basis for treatment or other patient management decisions.  A negative result may occur with improper specimen collection / handling, submission of specimen other than nasopharyngeal swab, presence of viral mutation(s) within the areas targeted by this assay, and inadequate number of viral copies (<250 copies / mL). A negative result must be combined with clinical observations, patient history, and epidemiological information.  Fact Sheet for Patients:   StrictlyIdeas.no  Fact Sheet for Healthcare Providers: BankingDealers.co.za  This test is not yet approved or  cleared by the [place] FDA and has been authorized for detection and/or diagnosis of SARS-CoV-2 by FDA under an Emergency Use Authorization (EUA).  This EUA will remain in effect (meaning this test can be used) for the duration of the COVID-19 declaration under Section 564(b)(1) of the Act, 21 U.S.C. section 360bbb-3(b)(1), unless the authorization is terminated or revoked sooner.  Performed at [facility], [place] [address], [place], [place] [address]   Surgical  pcr screen     Status: Abnormal   Collection Time: 07/07/20 11:37 PM   Specimen: Nasal Mucosa; Nasal Swab  Result Value Ref Range Status   MRSA, PCR NEGATIVE NEGATIVE Final   Staphylococcus aureus POSITIVE (A) NEGATIVE Final    Comment: (NOTE) The Xpert SA Assay (FDA approved for NASAL specimens in patients 19 years of age and older), is one component of a comprehensive surveillance  program. It is not intended to diagnose infection nor to guide or monitor treatment. Performed at [facility], [place] [address], [place], [place] [address]   SARS Coronavirus 2 by RT PCR (hospital order, performed in [facility] hospital lab) Nasopharyngeal Nasopharyngeal Swab     Status: None   Collection Time: 07/11/20  1:17 PM   Specimen: Nasopharyngeal Swab  Result Value Ref Range Status   SARS Coronavirus 2 NEGATIVE NEGATIVE Final    Comment: (NOTE) SARS-CoV-2 target nucleic acids are NOT DETECTED.  The SARS-CoV-2 RNA is generally detectable in upper and lower respiratory specimens during the acute phase of infection. The lowest concentration of SARS-CoV-2 viral copies this assay can detect is 250 copies / mL. A negative result does not preclude SARS-CoV-2 infection and should not be used as the sole basis for treatment or other patient management decisions.  A negative result may occur with improper specimen collection / handling, submission of specimen other than nasopharyngeal swab, presence of viral mutation(s) within the areas targeted by this assay, and inadequate number of viral copies (<250 copies / mL). A negative result must be combined with clinical observations, patient history, and epidemiological information.  Fact Sheet for Patients:   StrictlyIdeas.no  Fact Sheet for Healthcare Providers: BankingDealers.co.za  This test is not yet approved or  cleared by the [place] FDA and has been authorized for detection and/or  diagnosis of SARS-CoV-2 by FDA under an Emergency Use Authorization (EUA).  This EUA will remain in effect (meaning this test can be used) for the duration of the COVID-19 declaration under Section 564(b)(1) of the Act, 21 U.S.C. section 360bbb-3(b)(1), unless the authorization is terminated or revoked sooner.  Performed at [facility], [place] [address], [place], [place] [address]     Please note: You were cared for by a hospitalist during your hospital stay. Once you are discharged, your primary care physician will handle any further medical issues. Please note that NO REFILLS for any discharge medications will be authorized once you are discharged, as it is imperative that you return to your primary care physician (or establish a relationship with a primary care physician if you do not have one) for your post hospital discharge needs so that they can reassess your need for medications and monitor your lab values.  Signed: Terrilee Croak  [facility] 07/12/2020, 4:07 PM

## 2020-07-16 DIAGNOSIS — S32401D Unspecified fracture of right acetabulum, subsequent encounter for fracture with routine healing: Secondary | ICD-10-CM | POA: Diagnosis not present

## 2020-07-16 DIAGNOSIS — I10 Essential (primary) hypertension: Secondary | ICD-10-CM | POA: Diagnosis not present

## 2020-07-16 DIAGNOSIS — N179 Acute kidney failure, unspecified: Secondary | ICD-10-CM | POA: Diagnosis not present

## 2020-07-16 DIAGNOSIS — J9601 Acute respiratory failure with hypoxia: Secondary | ICD-10-CM | POA: Diagnosis not present

## 2020-07-18 ENCOUNTER — Non-Acute Institutional Stay: Payer: Medicare Other

## 2020-07-18 VITALS — BP 142/60 | HR 82 | Temp 98.0°F | Resp 18 | Wt 129.0 lb

## 2020-07-18 DIAGNOSIS — Z515 Encounter for palliative care: Secondary | ICD-10-CM

## 2020-07-18 DIAGNOSIS — E039 Hypothyroidism, unspecified: Secondary | ICD-10-CM | POA: Diagnosis not present

## 2020-07-18 DIAGNOSIS — I10 Essential (primary) hypertension: Secondary | ICD-10-CM | POA: Diagnosis not present

## 2020-07-18 DIAGNOSIS — K5900 Constipation, unspecified: Secondary | ICD-10-CM | POA: Diagnosis not present

## 2020-07-18 DIAGNOSIS — D649 Anemia, unspecified: Secondary | ICD-10-CM | POA: Diagnosis not present

## 2020-07-19 DIAGNOSIS — R627 Adult failure to thrive: Secondary | ICD-10-CM | POA: Diagnosis not present

## 2020-07-19 DIAGNOSIS — E039 Hypothyroidism, unspecified: Secondary | ICD-10-CM | POA: Diagnosis not present

## 2020-07-19 DIAGNOSIS — M1990 Unspecified osteoarthritis, unspecified site: Secondary | ICD-10-CM | POA: Diagnosis not present

## 2020-07-19 DIAGNOSIS — S32401D Unspecified fracture of right acetabulum, subsequent encounter for fracture with routine healing: Secondary | ICD-10-CM | POA: Diagnosis not present

## 2020-07-19 DIAGNOSIS — F0390 Unspecified dementia without behavioral disturbance: Secondary | ICD-10-CM | POA: Diagnosis not present

## 2020-07-20 ENCOUNTER — Other Ambulatory Visit: Payer: Self-pay

## 2020-07-22 NOTE — Progress Notes (Signed)
PATIENT NAME: Cindy [name] DOB: [birth year] MRN: [id number]  PRIMARY CARE PROVIDER: Josetta Huddle, MD  RESPONSIBLE PARTY:  Acct ID - Guarantor Home Phone Work Phone Relationship Acct Type  [id number] Seriah Brotzman,* [phone]  Self P/F     [place], [place], [place] [address]    PLAN OF CARE and INTERVENTIONS:               1.  GOALS OF CARE/ ADVANCE CARE PLANNING: Goal is for patient to return to her home. Patient has a DNR and MOST form.               2.  PATIENT/CAREGIVER EDUCATION:  Education provided on Palliative care services,  importance of pain management so patient will be able to participate in rehab. Education provided on prevention on skin breakdown and risk for pneumonia due to immobility. Reviewed use of incentive spirometer.                              3.  DISEASE STATUS: RN visit, met with patient and with her son, Franchot Mimes, in patient's room at [facility]. Patient awake and oriented x 4. Engaging with conversation and able to share specifics regarding fall and subsequent injury and hospitalization. Discussed Palliative care services and goals of care with patient and son. Per report of Joy RN at facility, PCP ordered Dilaudid for patient Q 6 Hours routinely and also PRN dose. Facility nurse to complete pain assessment Q 3 Hours and provide PRN meds as appropriate.     HISTORY Of Present Illness: This is a 86 year old female with Medical history of Right Acetabular fx s/p fall, Depression, HX of colon, anal and skin cancer, hypothyroidism, hypertenstion and GERD. Palliative care will continue to follow patient monthly and prn.  Code Status: DNR MOST FORM: Yes PPS: 30%   PHYSICAL EXAM:   VITALS: Today's Vitals   07/18/20 1500  BP: (!) 142/60  Pulse: 82  Resp: 18  Temp: 98 F (36.7 C)  SpO2: 96%  Weight: 129 lb (58.5 kg)  PainSc: 6   PainLoc: Hip    LUNGS: Clear to Auscultation, remains on Oxygen 2 liters n/c CARDIAC: RRR, denies any chest  pain, no edema noted EXTREMITIES: No edema noted. Color and temp- wnl. Pedal pulses present. Patient is able to move toes. SKIN:  Patient and Facility caregivers deny any areas of concerns NEURO: Patient is awake, engaging in conversation. Oriented x 4. Able to share circumstances around fall and subsequent injury.  (Duration of visit and Documentation 90 minutes)      Cornelius Moras, RN

## 2020-07-23 DIAGNOSIS — J9601 Acute respiratory failure with hypoxia: Secondary | ICD-10-CM | POA: Diagnosis not present

## 2020-07-23 DIAGNOSIS — N179 Acute kidney failure, unspecified: Secondary | ICD-10-CM | POA: Diagnosis not present

## 2020-07-23 DIAGNOSIS — S32401D Unspecified fracture of right acetabulum, subsequent encounter for fracture with routine healing: Secondary | ICD-10-CM | POA: Diagnosis not present

## 2020-07-23 DIAGNOSIS — R52 Pain, unspecified: Secondary | ICD-10-CM | POA: Diagnosis not present

## 2020-07-24 DIAGNOSIS — F411 Generalized anxiety disorder: Secondary | ICD-10-CM | POA: Diagnosis not present

## 2020-07-24 DIAGNOSIS — F331 Major depressive disorder, recurrent, moderate: Secondary | ICD-10-CM | POA: Diagnosis not present

## 2020-07-24 DIAGNOSIS — R52 Pain, unspecified: Secondary | ICD-10-CM | POA: Diagnosis not present

## 2020-07-24 DIAGNOSIS — R531 Weakness: Secondary | ICD-10-CM | POA: Diagnosis not present

## 2020-07-24 DIAGNOSIS — F0390 Unspecified dementia without behavioral disturbance: Secondary | ICD-10-CM | POA: Diagnosis not present

## 2020-07-28 ENCOUNTER — Encounter (HOSPITAL_COMMUNITY): Payer: Self-pay

## 2020-07-28 ENCOUNTER — Other Ambulatory Visit: Payer: Self-pay

## 2020-07-28 ENCOUNTER — Inpatient Hospital Stay (HOSPITAL_COMMUNITY): Payer: Medicare Other

## 2020-07-28 ENCOUNTER — Emergency Department (HOSPITAL_COMMUNITY): Payer: Medicare Other

## 2020-07-28 ENCOUNTER — Inpatient Hospital Stay (HOSPITAL_COMMUNITY)
Admission: EM | Admit: 2020-07-28 | Discharge: 2020-07-30 | DRG: 175 | Disposition: A | Discharge status: Skilled Nursing Facility | Payer: Medicare Other | Source: Skilled Nursing Facility | Attending: Internal Medicine | Admitting: Internal Medicine

## 2020-07-28 DIAGNOSIS — Z85048 Personal history of other malignant neoplasm of rectum, rectosigmoid junction, and anus: Secondary | ICD-10-CM

## 2020-07-28 DIAGNOSIS — Z881 Allergy status to other antibiotic agents status: Secondary | ICD-10-CM | POA: Diagnosis not present

## 2020-07-28 DIAGNOSIS — Z79890 Hormone replacement therapy (postmenopausal): Secondary | ICD-10-CM | POA: Diagnosis not present

## 2020-07-28 DIAGNOSIS — J9811 Atelectasis: Secondary | ICD-10-CM | POA: Diagnosis present

## 2020-07-28 DIAGNOSIS — Z9981 Dependence on supplemental oxygen: Secondary | ICD-10-CM | POA: Diagnosis not present

## 2020-07-28 DIAGNOSIS — E785 Hyperlipidemia, unspecified: Secondary | ICD-10-CM | POA: Diagnosis not present

## 2020-07-28 DIAGNOSIS — K219 Gastro-esophageal reflux disease without esophagitis: Secondary | ICD-10-CM | POA: Diagnosis present

## 2020-07-28 DIAGNOSIS — Z20822 Contact with and (suspected) exposure to covid-19: Secondary | ICD-10-CM | POA: Diagnosis present

## 2020-07-28 DIAGNOSIS — R079 Chest pain, unspecified: Secondary | ICD-10-CM | POA: Diagnosis not present

## 2020-07-28 DIAGNOSIS — W1830XA Fall on same level, unspecified, initial encounter: Secondary | ICD-10-CM | POA: Diagnosis present

## 2020-07-28 DIAGNOSIS — R531 Weakness: Secondary | ICD-10-CM | POA: Diagnosis not present

## 2020-07-28 DIAGNOSIS — S32491A Other specified fracture of right acetabulum, initial encounter for closed fracture: Secondary | ICD-10-CM | POA: Diagnosis present

## 2020-07-28 DIAGNOSIS — Z85038 Personal history of other malignant neoplasm of large intestine: Secondary | ICD-10-CM | POA: Diagnosis not present

## 2020-07-28 DIAGNOSIS — E039 Hypothyroidism, unspecified: Secondary | ICD-10-CM | POA: Diagnosis present

## 2020-07-28 DIAGNOSIS — R0602 Shortness of breath: Secondary | ICD-10-CM | POA: Diagnosis not present

## 2020-07-28 DIAGNOSIS — S32401D Unspecified fracture of right acetabulum, subsequent encounter for fracture with routine healing: Secondary | ICD-10-CM | POA: Diagnosis not present

## 2020-07-28 DIAGNOSIS — I10 Essential (primary) hypertension: Secondary | ICD-10-CM

## 2020-07-28 DIAGNOSIS — J90 Pleural effusion, not elsewhere classified: Secondary | ICD-10-CM | POA: Diagnosis not present

## 2020-07-28 DIAGNOSIS — R062 Wheezing: Secondary | ICD-10-CM | POA: Diagnosis not present

## 2020-07-28 DIAGNOSIS — I2699 Other pulmonary embolism without acute cor pulmonale: Secondary | ICD-10-CM

## 2020-07-28 DIAGNOSIS — Z8249 Family history of ischemic heart disease and other diseases of the circulatory system: Secondary | ICD-10-CM

## 2020-07-28 DIAGNOSIS — I214 Non-ST elevation (NSTEMI) myocardial infarction: Secondary | ICD-10-CM

## 2020-07-28 DIAGNOSIS — Z885 Allergy status to narcotic agent status: Secondary | ICD-10-CM

## 2020-07-28 DIAGNOSIS — R404 Transient alteration of awareness: Secondary | ICD-10-CM | POA: Diagnosis not present

## 2020-07-28 DIAGNOSIS — R279 Unspecified lack of coordination: Secondary | ICD-10-CM | POA: Diagnosis not present

## 2020-07-28 DIAGNOSIS — J9601 Acute respiratory failure with hypoxia: Secondary | ICD-10-CM | POA: Diagnosis present

## 2020-07-28 DIAGNOSIS — I82409 Acute embolism and thrombosis of unspecified deep veins of unspecified lower extremity: Secondary | ICD-10-CM

## 2020-07-28 DIAGNOSIS — I700 Atherosclerosis of aorta: Secondary | ICD-10-CM | POA: Diagnosis not present

## 2020-07-28 DIAGNOSIS — I5020 Unspecified systolic (congestive) heart failure: Secondary | ICD-10-CM | POA: Diagnosis not present

## 2020-07-28 DIAGNOSIS — D649 Anemia, unspecified: Secondary | ICD-10-CM | POA: Diagnosis present

## 2020-07-28 DIAGNOSIS — Y92821 Forest as the place of occurrence of the external cause: Secondary | ICD-10-CM

## 2020-07-28 DIAGNOSIS — I82401 Acute embolism and thrombosis of unspecified deep veins of right lower extremity: Secondary | ICD-10-CM | POA: Diagnosis not present

## 2020-07-28 DIAGNOSIS — F329 Major depressive disorder, single episode, unspecified: Secondary | ICD-10-CM | POA: Diagnosis present

## 2020-07-28 DIAGNOSIS — Z841 Family history of disorders of kidney and ureter: Secondary | ICD-10-CM

## 2020-07-28 DIAGNOSIS — R278 Other lack of coordination: Secondary | ICD-10-CM | POA: Diagnosis not present

## 2020-07-28 DIAGNOSIS — I2609 Other pulmonary embolism with acute cor pulmonale: Secondary | ICD-10-CM

## 2020-07-28 DIAGNOSIS — I151 Hypertension secondary to other renal disorders: Secondary | ICD-10-CM | POA: Diagnosis present

## 2020-07-28 DIAGNOSIS — F03918 Unspecified dementia, unspecified severity, with other behavioral disturbance: Secondary | ICD-10-CM | POA: Diagnosis present

## 2020-07-28 DIAGNOSIS — R627 Adult failure to thrive: Secondary | ICD-10-CM | POA: Diagnosis not present

## 2020-07-28 DIAGNOSIS — G2581 Restless legs syndrome: Secondary | ICD-10-CM | POA: Diagnosis not present

## 2020-07-28 DIAGNOSIS — I82551 Chronic embolism and thrombosis of right peroneal vein: Secondary | ICD-10-CM | POA: Diagnosis present

## 2020-07-28 DIAGNOSIS — R509 Fever, unspecified: Secondary | ICD-10-CM | POA: Diagnosis not present

## 2020-07-28 DIAGNOSIS — Z79899 Other long term (current) drug therapy: Secondary | ICD-10-CM

## 2020-07-28 DIAGNOSIS — Z923 Personal history of irradiation: Secondary | ICD-10-CM | POA: Diagnosis not present

## 2020-07-28 DIAGNOSIS — R0902 Hypoxemia: Secondary | ICD-10-CM | POA: Diagnosis not present

## 2020-07-28 DIAGNOSIS — Z85828 Personal history of other malignant neoplasm of skin: Secondary | ICD-10-CM | POA: Diagnosis not present

## 2020-07-28 DIAGNOSIS — Z66 Do not resuscitate: Secondary | ICD-10-CM | POA: Diagnosis present

## 2020-07-28 DIAGNOSIS — M6281 Muscle weakness (generalized): Secondary | ICD-10-CM | POA: Diagnosis not present

## 2020-07-28 DIAGNOSIS — I2510 Atherosclerotic heart disease of native coronary artery without angina pectoris: Secondary | ICD-10-CM | POA: Diagnosis not present

## 2020-07-28 DIAGNOSIS — F0390 Unspecified dementia without behavioral disturbance: Secondary | ICD-10-CM | POA: Diagnosis present

## 2020-07-28 DIAGNOSIS — I82451 Acute embolism and thrombosis of right peroneal vein: Secondary | ICD-10-CM | POA: Diagnosis not present

## 2020-07-28 DIAGNOSIS — R41841 Cognitive communication deficit: Secondary | ICD-10-CM | POA: Diagnosis not present

## 2020-07-28 DIAGNOSIS — R778 Other specified abnormalities of plasma proteins: Secondary | ICD-10-CM | POA: Diagnosis not present

## 2020-07-28 DIAGNOSIS — M1990 Unspecified osteoarthritis, unspecified site: Secondary | ICD-10-CM | POA: Diagnosis not present

## 2020-07-28 DIAGNOSIS — R52 Pain, unspecified: Secondary | ICD-10-CM | POA: Diagnosis not present

## 2020-07-28 DIAGNOSIS — M810 Age-related osteoporosis without current pathological fracture: Secondary | ICD-10-CM | POA: Diagnosis not present

## 2020-07-28 DIAGNOSIS — S32409A Unspecified fracture of unspecified acetabulum, initial encounter for closed fracture: Secondary | ICD-10-CM | POA: Diagnosis not present

## 2020-07-28 DIAGNOSIS — Z743 Need for continuous supervision: Secondary | ICD-10-CM | POA: Diagnosis not present

## 2020-07-28 DIAGNOSIS — F419 Anxiety disorder, unspecified: Secondary | ICD-10-CM | POA: Diagnosis not present

## 2020-07-28 DIAGNOSIS — F0391 Unspecified dementia with behavioral disturbance: Secondary | ICD-10-CM | POA: Diagnosis present

## 2020-07-28 DIAGNOSIS — Z9181 History of falling: Secondary | ICD-10-CM | POA: Diagnosis not present

## 2020-07-28 DIAGNOSIS — F331 Major depressive disorder, recurrent, moderate: Secondary | ICD-10-CM | POA: Diagnosis not present

## 2020-07-28 HISTORY — DX: Other pulmonary embolism without acute cor pulmonale: I26.99

## 2020-07-28 HISTORY — DX: Acute respiratory failure with hypoxia: J96.01

## 2020-07-28 LAB — ECHOCARDIOGRAM COMPLETE
Area-P 1/2: 3.77 cm2
Height: 66 in
MV M vel: 4.8 m/s
MV Peak grad: 92.2 mmHg
S' Lateral: 2.6 cm
Weight: 2560 oz

## 2020-07-28 LAB — CBC WITH DIFFERENTIAL/PLATELET
Abs Immature Granulocytes: 0.04 10*3/uL (ref 0.00–0.07)
Basophils Absolute: 0 10*3/uL (ref 0.0–0.1)
Basophils Relative: 0 %
Eosinophils Absolute: 0 10*3/uL (ref 0.0–0.5)
Eosinophils Relative: 0 %
HCT: 27.7 % — ABNORMAL LOW (ref 36.0–46.0)
Hemoglobin: 9.1 g/dL — ABNORMAL LOW (ref 12.0–15.0)
Immature Granulocytes: 0 %
Lymphocytes Relative: 8 %
Lymphs Abs: 0.8 10*3/uL (ref 0.7–4.0)
MCH: 32.3 pg (ref 26.0–34.0)
MCHC: 32.9 g/dL (ref 30.0–36.0)
MCV: 98.2 fL (ref 80.0–100.0)
Monocytes Absolute: 0.5 10*3/uL (ref 0.1–1.0)
Monocytes Relative: 5 %
Neutro Abs: 8.6 10*3/uL — ABNORMAL HIGH (ref 1.7–7.7)
Neutrophils Relative %: 87 %
Platelets: 405 10*3/uL — ABNORMAL HIGH (ref 150–400)
RBC: 2.82 MIL/uL — ABNORMAL LOW (ref 3.87–5.11)
RDW: 12.3 % (ref 11.5–15.5)
WBC: 10.1 10*3/uL (ref 4.0–10.5)
nRBC: 0 % (ref 0.0–0.2)

## 2020-07-28 LAB — TROPONIN I (HIGH SENSITIVITY)
Troponin I (High Sensitivity): 2166 ng/L (ref <18)
Troponin I (High Sensitivity): 4088 ng/L (ref <18)
Troponin I (High Sensitivity): 5263 ng/L (ref <18)
Troponin I (High Sensitivity): 4291 ng/L (ref <18)

## 2020-07-28 LAB — COMPREHENSIVE METABOLIC PANEL
ALT: 37 U/L (ref 0–44)
AST: 40 U/L (ref 15–41)
Albumin: 3 g/dL — ABNORMAL LOW (ref 3.5–5.0)
Alkaline Phosphatase: 85 U/L (ref 38–126)
Anion gap: 10 (ref 5–15)
BUN: 17 mg/dL (ref 8–23)
CO2: 25 mmol/L (ref 22–32)
Calcium: 8.5 mg/dL — ABNORMAL LOW (ref 8.9–10.3)
Chloride: 96 mmol/L — ABNORMAL LOW (ref 98–111)
Creatinine, Ser: 1.09 mg/dL — ABNORMAL HIGH (ref 0.44–1.00)
GFR calc Af Amer: 52 mL/min — ABNORMAL LOW (ref >60)
GFR calc non Af Amer: 45 mL/min — ABNORMAL LOW (ref >60)
Glucose, Bld: 158 mg/dL — ABNORMAL HIGH (ref 70–99)
Potassium: 4 mmol/L (ref 3.5–5.1)
Sodium: 131 mmol/L — ABNORMAL LOW (ref 135–145)
Total Bilirubin: 0.7 mg/dL (ref 0.3–1.2)
Total Protein: 7.1 g/dL (ref 6.5–8.1)

## 2020-07-28 LAB — LACTIC ACID, PLASMA
Lactic Acid, Venous: 1.4 mmol/L (ref 0.5–1.9)
Lactic Acid, Venous: 1.1 mmol/L (ref 0.5–1.9)

## 2020-07-28 LAB — POC OCCULT BLOOD, ED: Fecal Occult Bld: NEGATIVE

## 2020-07-28 LAB — PROTIME-INR
INR: 1.2 (ref 0.8–1.2)
Prothrombin Time: 14.9 seconds (ref 11.4–15.2)

## 2020-07-28 LAB — BRAIN NATRIURETIC PEPTIDE
B Natriuretic Peptide: 275.6 pg/mL — ABNORMAL HIGH (ref 0.0–100.0)
B Natriuretic Peptide: 597.5 pg/mL — ABNORMAL HIGH (ref 0.0–100.0)
B Natriuretic Peptide: 969.7 pg/mL — ABNORMAL HIGH (ref 0.0–100.0)

## 2020-07-28 LAB — SARS CORONAVIRUS 2 BY RT PCR (HOSPITAL ORDER, PERFORMED IN ~~LOC~~ HOSPITAL LAB): SARS Coronavirus 2: NEGATIVE

## 2020-07-28 LAB — D-DIMER, QUANTITATIVE: D-Dimer, Quant: 16.59 ug/mL-FEU — ABNORMAL HIGH (ref 0.00–0.50)

## 2020-07-28 LAB — HEPARIN LEVEL (UNFRACTIONATED)
Heparin Unfractionated: 0.44 IU/mL (ref 0.30–0.70)
Heparin Unfractionated: 0.29 IU/mL — ABNORMAL LOW (ref 0.30–0.70)

## 2020-07-28 LAB — APTT: aPTT: 30 seconds (ref 24–36)

## 2020-07-28 MED ORDER — ESCITALOPRAM OXALATE 10 MG PO TABS
10.0000 mg | ORAL_TABLET | Freq: Every day | ORAL | Status: DC
  Administered 2020-07-28 – 2020-07-30 (×3): 10 mg via ORAL
  Filled 2020-07-28 (×3): qty 1

## 2020-07-28 MED ORDER — ALPRAZOLAM 0.25 MG PO TABS
0.2500 mg | ORAL_TABLET | Freq: Every day | ORAL | Status: DC
  Administered 2020-07-28 – 2020-07-29 (×2): 0.25 mg via ORAL
  Filled 2020-07-28 (×2): qty 1

## 2020-07-28 MED ORDER — ONDANSETRON HCL 4 MG PO TABS: 4.0000 mg | ORAL_TABLET | Freq: Four times a day (QID) | ORAL | Status: DC | PRN

## 2020-07-28 MED ORDER — HYDROMORPHONE HCL 2 MG PO TABS
2.0000 mg | ORAL_TABLET | ORAL | Status: DC | PRN
  Administered 2020-07-28 – 2020-07-30 (×7): 2 mg via ORAL
  Filled 2020-07-28 (×8): qty 1

## 2020-07-28 MED ORDER — LEVOTHYROXINE SODIUM 88 MCG PO TABS
88.0000 ug | ORAL_TABLET | Freq: Every day | ORAL | Status: DC
  Administered 2020-07-29 – 2020-07-30 (×2): 88 ug via ORAL
  Filled 2020-07-28 (×2): qty 1

## 2020-07-28 MED ORDER — HEPARIN BOLUS VIA INFUSION
2000.0000 [IU] | Freq: Once | INTRAVENOUS | Status: AC
  Administered 2020-07-28: 2000 [IU] via INTRAVENOUS
  Filled 2020-07-28: qty 2000

## 2020-07-28 MED ORDER — HEPARIN (PORCINE) 25000 UT/250ML-% IV SOLN
1000.0000 [IU]/h | INTRAVENOUS | Status: DC
  Administered 2020-07-28: 1000 [IU]/h via INTRAVENOUS
  Filled 2020-07-28: qty 250

## 2020-07-28 MED ORDER — ONDANSETRON HCL 4 MG/2ML IJ SOLN: 4.0000 mg | Freq: Four times a day (QID) | INTRAMUSCULAR | Status: DC | PRN

## 2020-07-28 MED ORDER — SODIUM CHLORIDE (PF) 0.9 % IJ SOLN
INTRAMUSCULAR | Status: AC
  Filled 2020-07-28: qty 50

## 2020-07-28 MED ORDER — BUPROPION HCL ER (SR) 100 MG PO TB12
200.0000 mg | ORAL_TABLET | Freq: Every day | ORAL | Status: DC
  Administered 2020-07-28 – 2020-07-30 (×3): 200 mg via ORAL
  Filled 2020-07-28 (×4): qty 2

## 2020-07-28 MED ORDER — HYDROMORPHONE HCL 2 MG PO TABS
2.0000 mg | ORAL_TABLET | Freq: Once | ORAL | Status: AC
  Administered 2020-07-28: 2 mg via ORAL
  Filled 2020-07-28: qty 1

## 2020-07-28 MED ORDER — METHOCARBAMOL 500 MG PO TABS
500.0000 mg | ORAL_TABLET | Freq: Three times a day (TID) | ORAL | Status: DC
  Administered 2020-07-28 – 2020-07-30 (×6): 500 mg via ORAL
  Filled 2020-07-28 (×6): qty 1

## 2020-07-28 MED ORDER — PANTOPRAZOLE SODIUM 40 MG PO TBEC
40.0000 mg | DELAYED_RELEASE_TABLET | Freq: Every day | ORAL | Status: DC
  Administered 2020-07-28 – 2020-07-30 (×3): 40 mg via ORAL
  Filled 2020-07-28 (×3): qty 1

## 2020-07-28 MED ORDER — IOHEXOL 350 MG/ML SOLN
80.0000 mL | Freq: Once | INTRAVENOUS | Status: AC | PRN
  Administered 2020-07-28: 80 mL via INTRAVENOUS

## 2020-07-28 MED ORDER — PERFLUTREN LIPID MICROSPHERE
1.0000 mL | INTRAVENOUS | Status: AC | PRN
  Administered 2020-07-28: 2 mL via INTRAVENOUS
  Filled 2020-07-28: qty 10

## 2020-07-28 MED ORDER — POLYETHYLENE GLYCOL 3350 17 G PO PACK
17.0000 g | PACK | Freq: Every day | ORAL | Status: DC
  Administered 2020-07-28 – 2020-07-30 (×3): 17 g via ORAL
  Filled 2020-07-28 (×2): qty 1

## 2020-07-28 MED ORDER — DOCUSATE SODIUM 100 MG PO CAPS
100.0000 mg | ORAL_CAPSULE | Freq: Two times a day (BID) | ORAL | Status: DC
  Administered 2020-07-28 – 2020-07-30 (×5): 100 mg via ORAL
  Filled 2020-07-28 (×5): qty 1

## 2020-07-28 MED ORDER — CHLORHEXIDINE GLUCONATE CLOTH 2 % EX PADS
6.0000 | MEDICATED_PAD | Freq: Every day | CUTANEOUS | Status: DC
  Administered 2020-07-29: 6 via TOPICAL

## 2020-07-28 NOTE — Consult Note (Signed)
Cindy [name], MRN:  [id number], DOB:  [birth year], LOS: 0 ADMISSION DATE:  07/28/2020, CONSULTATION DATE:  07/28/20 REFERRING MD:  Hal Hope, CHIEF COMPLAINT:  Submassive PE   Brief History   Submassive PE, recent unrepaired R hip fracture, immobility.  History of present illness   Cindy [name] is an 86 y/o woman with a history of recent right acetabular fracture post mechanical fall, dementia, hypertension, GERD, anal cancer in 2014 who presents from rehab with acute shortness of breath that began yesterday.  History is mostly provided by her daughter Cindy [name] who is at bedside.  Her mother has dementia and had a fall [place] the [place] by her [place] about a month ago.  She was not deemed a good candidate for operative repair.  She has had continued right leg pain.  Yesterday at rehab was her first day getting out of bed for mobility.  She began having shortness of breath overnight.  The patient currently denies shortness of breath, chest pain, lightheadedness.  She has no previous history of VTE, lung disease, cardiac disease.  On CTA she was found to have a PE, and heparin was started.  Pulmonology was consulted for evaluation of submassive PE.  Past Medical History  Dementia Right hip fracture Hypertension Anal cancer in 2014 GERD  Significant Hospital Events     Consults:  PCCM  Procedures:    Significant Diagnostic Tests:  CTA chest-  Micro Data:  covid negative  Antimicrobials:    Interim history/subjective:    Objective   Blood pressure 119/66, pulse 78, temperature 99 F (37.2 C), resp. rate 13, height 5\' 6"  (1.676 m), weight 72.6 kg, SpO2 100 %.       No intake or output data in the 24 hours ending 07/28/20 1014 Filed Weights   07/28/20 0144  Weight: 72.6 kg    Examination: General: Chronically ill-appearing woman lying in bed HENT: [place]/AT, eyes anicteric Lungs: Breathing comfortably on 3.5L nasal cannula, CTAB.  Speaking in full sentences.  No  accessory muscle use. Cardiovascular: Regular rate and rhythm Abdomen: Soft, nontender, nondistended Extremities: Right hip flexed, internally rotated.  Mild pedal edema on the right. Neuro: Awake and alert, answering questions appropriately but not accurately. Derm: No rashes  Resolved Hospital Problem list     Assessment & Plan:  Submassive provoked PE due to recent immobility from hip fracture. -Continue heparin -Discussed risks, benefits, alternatives of escalated therapies including peripheral thrombolytics and catheter directed thrombolytics.  Given her recent unrepaired hip fracture there is concern for potential hematoma into that area, which would be due to difficult to compress.  Her overall frailty and poor health in addition to age put her at increased risk for bleeding complications with thrombolytics.  Given her lack of symptoms and hemodynamic stability, would favor conservative management with heparin.  If she has worsening hemodynamics would recommend catheter- directed thrombolytics.  I had a shared decision-making discussion with her daughter at bedside, who agrees, especially in light of DNR CODE STATUS, which was confirmed during the encounter. -Echocardiogram pending -Monitor on telemetry -Serial troponins until peaked -Continue monitoring hemodynamics and symptoms closely.   Best practice:  Per primary  Labs   CBC: Recent Labs  Lab 07/28/20 0235  WBC 10.1  NEUTROABS 8.6*  HGB 9.1*  HCT 27.7*  MCV 98.2  PLT 405*    Basic Metabolic Panel: Recent Labs  Lab 07/28/20 0235  NA 131*  K 4.0  CL 96*  CO2 25  GLUCOSE 158*  BUN 17  CREATININE 1.09*  CALCIUM 8.5*   GFR: Estimated Creatinine Clearance: 36.4 mL/min (A) (by C-G formula based on SCr of 1.09 mg/dL (H)). Recent Labs  Lab 07/28/20 0235 07/28/20 0648  WBC 10.1  --   LATICACIDVEN  --  1.4    Liver Function Tests: Recent Labs  Lab 07/28/20 0235  AST 40  ALT 37  ALKPHOS 85  BILITOT  0.7  PROT 7.1  ALBUMIN 3.0*   No results for input(s): LIPASE, AMYLASE in the last 168 hours. No results for input(s): AMMONIA in the last 168 hours.  ABG    Component Value Date/Time   PHART 7.475 (H) 10/18/2012 0914   PCO2ART 29.3 (L) 10/18/2012 0914   PO2ART 101.0 (H) 10/18/2012 0914   HCO3 21.3 10/18/2012 0914   TCO2 18.6 10/18/2012 0914   ACIDBASEDEF 0.8 10/18/2012 0914   O2SAT 98.0 10/18/2012 0914     Coagulation Profile: Recent Labs  Lab 07/28/20 0636  INR 1.2    Cardiac Enzymes: No results for input(s): CKTOTAL, CKMB, CKMBINDEX, TROPONINI in the last 168 hours.  HbA1C: No results found for: HGBA1C  CBG: No results for input(s): GLUCAP in the last 168 hours.  Review of Systems:   Review of Systems  Constitutional: Negative for chills and fever.  HENT: Negative.   Respiratory: Negative for shortness of breath.   Cardiovascular: Negative for chest pain and leg swelling.  Gastrointestinal: Negative.   Musculoskeletal:       Leg pain from fracture  Neurological: Negative for loss of consciousness.     Past Medical History  She,  has a past medical history of Arthritis, Cancer ([place]), Colon cancer ([place]) (08/31/13), Deaf, left, Depression, GERD (gastroesophageal reflux disease), History of radiation therapy (10/10/13-11/23/13), Hyperlipemia, Hypertension, Hypothyroidism, PONV (postoperative nausea and vomiting), and Skin cancer.   Surgical History    Past Surgical History:  Procedure Laterality Date   ABDOMINAL HYSTERECTOMY     APPENDECTOMY     CESAREAN SECTION     2   CHOLECYSTECTOMY     COLONOSCOPY  08/31/13   invasive squamous cell colon   ERCP     ESOPHAGOGASTRODUODENOSCOPY  10/20/2012   Procedure: ESOPHAGOGASTRODUODENOSCOPY (EGD);  Surgeon: Arta Silence, MD;  Location: [facility];  Service: Endoscopy;  Laterality: Left;   EYE SURGERY     cataracts   KNEE ARTHROSCOPY  05/11/2012   Procedure: ARTHROSCOPY KNEE;  Surgeon: Hessie Dibble,  MD;  Location: [facility];  Service: Orthopedics;  Laterality: Right;  left knee medial menisectomy and chondroplasty   THYROIDECTOMY, PARTIAL     TONSILLECTOMY       Social History   reports that she has never smoked. She has never used smokeless tobacco. She reports that she does not drink alcohol and does not use drugs.   Family History   Her family history includes Cancer in her daughter, sister, sister, and sister; Heart attack in her father; Heart disease in her father; Kidney disease in her mother.   Allergies Allergies  Allergen Reactions   Vancomycin Hives    Zosyn and vanc given together; unclear which was precipitating med. Appears to have tolerated cephalosporins in the past.   Zosyn [Piperacillin Sod-Tazobactam So] Hives    Zosyn and vanc given together; unclear which was precipitating med. Appears to have tolerated cephalosporins in the past.   Hydrocodone Nausea Only   Morphine And Related Hives     Home Medications  Prior to Admission medications   Medication  Sig Start Date End Date Taking? Authorizing Provider  acetaminophen (TYLENOL) 325 MG tablet Take 650 mg by mouth every 6 (six) hours as needed for moderate pain.   Yes [provider]  ALPRAZolam (XANAX) 0.25 MG tablet Take 1 tablet by mouth daily. 04/04/14  Yes [provider]  amLODipine (NORVASC) 5 MG tablet Take 1 tablet (5 mg total) by mouth daily. 07/13/20  Yes Dahal, Marlowe Aschoff, MD  buPROPion (WELLBUTRIN SR) 200 MG 12 hr tablet Take 200 mg by mouth daily.    Yes [provider]  docusate sodium (COLACE) 100 MG capsule Take 100 mg by mouth 2 (two) times daily.   Yes [provider]  escitalopram (LEXAPRO) 10 MG tablet Take 10 mg by mouth daily.   Yes [provider]  HYDROmorphone (DILAUDID) 2 MG tablet Take 2 mg by mouth every 3 (three) hours as needed for severe pain.   Yes [provider]  levothyroxine (SYNTHROID, LEVOTHROID) 88 MCG  tablet Take 88 mcg by mouth daily before breakfast.   Yes [provider]  methocarbamol (ROBAXIN) 500 MG tablet Take 500 mg by mouth 3 (three) times daily.   Yes [provider]  ondansetron (ZOFRAN ODT) 4 MG disintegrating tablet Take 1 tablet (4 mg total) by mouth every 8 (eight) hours as needed for nausea or vomiting. 07/06/17  Yes Sherwood Gambler, MD  pantoprazole (PROTONIX) 40 MG tablet Take 40 mg by mouth daily.   Yes [provider]  polyethylene glycol (MIRALAX / GLYCOLAX) 17 g packet Take 17 g by mouth daily.   Yes [provider]  PREMARIN 0.3 MG tablet Take 0.3 mg by mouth daily. 08/14/16  Yes [provider]  enoxaparin (LOVENOX) 30 MG/0.3ML injection Inject 0.6 mLs (60 mg total) into the skin daily. 07/12/20 08/11/20  Ainsley Spinner, PA-C     Julian Hy, DO 07/28/20 1:10 PM [facility]

## 2020-07-28 NOTE — Assessment & Plan Note (Addendum)
-  Bilateral PE in setting of significant immobility from recent right acetabular fracture.  Discharged from the hospital on 07/12/2020 to rehab.  She was already on oxygen, 2 L at rehab which her daughter states that was initiated in the setting of her being on Dilaudid for pain control but she has not had an oxygen requirement prior to rehab/previous hospitalization -I have told her and family that she needs to taper off of Dilaudid sooner than later -Currently on 4 L oxygen, will wean as able -Initially on heparin drip which was transitioned to Eliquis on 07/29/2020  -lower extremity duplex: age indeterminate deep vein thrombosis involving a single right peroneal vein. -Echo obtained, no obvious right heart strain.  EF is reduced, 30 to 35%, Gr II DD (last echo May 2018, EF 55-60%, Gr 1 DD).  - troponin significantly elevated likely due to clot burden; EKG reviewed from admission (NSR, no obvious ischemia); continue trending troponin; per pulmonology, unlikely for further decompensation.

## 2020-07-28 NOTE — Assessment & Plan Note (Signed)
Continue Synthroid °

## 2020-07-28 NOTE — Progress Notes (Signed)
Pharmacy Brief Note - Anticoagulation Follow Up:  Pharmacy consulted to dose/monitor heparin drip in this 34 yoF for acute PE.   Assessment:  HL = 0.44 is therapeutic on heparin infusion of 1000 units/hr  Confirmed with RN that heparin infusing at correct rate with no interruptions. No signs of bleeding.  Goal: HL 0.3 - 0.7  Plan:  Continue heparin infusion at current rate of 1000 units/hr  Check confirmatory HL in 8 hours  HL, CBC daily while on heparin   Lenis Noon, PharmD 07/28/20 4:43 PM

## 2020-07-28 NOTE — ED Triage Notes (Signed)
Pt. Came in by [facility] from [facility]. Around 11pm pt. noticed she started becoming SOB. At facility O2 was 88% on 2L [place].  No hx of respiratory issues;  Covid negative ( tested recently at nursing home)  Hx of dementia  EMS gave albuterol 5mg   122/80- 102- RR 22- 95% 2L CBG: 127 20g L forearm  Sinus tach.

## 2020-07-28 NOTE — Assessment & Plan Note (Signed)
-   currently on 4L in ER - now etiology is considered due to acute PE and clot burden; previously was on O2 at rehab per daughter because patient "was on dilaudid also" - wean as able but given PE, may be long term wean

## 2020-07-28 NOTE — ED Notes (Signed)
Date and time results received: 07/28/20 7:47 AM  Test: Trop Critical Value: [id number]  Name of Provider Notified: Dwyane Dee MD  Orders Received? Or Actions Taken?:

## 2020-07-28 NOTE — H&P (Signed)
History and Physical    Cindy [name] FWY:[id number] DOB: 07/20/1930 DOA: 07/28/2020  PCP: Josetta Huddle, MD  Patient coming from: [place].  Chief Complaint: Shortness of breath.  HPI: Cindy [name] is a 86 y.o. female with history of hypertension, hypothyroidism, depression recently admitted for acetabular fracture will manage conservatively discharged to rehab started experiencing shortness of breath last night and was brought to the ER did not have any chest pain.  Per patient's daughter patient was trying to ambulate for the first time yesterday.  ED Course: In the ER patient was mildly hypoxic.  Patient blood pressure was stable but for one reading which went to 93 systolic others were more than 100.  D-dimer was 16 Covid test was negative CT angiogram of the chest shows bilateral pulmonary embolism with large burden of pulmonary emboli in the right side.  No heart strain seen in the CAT scan.  Patient was started on heparin and admitted for further management.  Patient's troponin is 2100 and increased to 4000.  Hemoglobin 9.1 which is at baseline.  Sodium 131 creatinine 1 EKG shows normal sinus rhythm.  Review of Systems: As per HPI, rest all negative.   Past Medical History:  Diagnosis Date  . Arthritis   . Cancer ([place])    skin  . Colon cancer ([place]) 08/31/13   invasive squamous cell  . Deaf, left   . Depression   . GERD (gastroesophageal reflux disease)   . History of radiation therapy 10/10/13-11/23/13   anal canal 54GY/  . Hyperlipemia   . Hypertension   . Hypothyroidism   . PONV (postoperative nausea and vomiting)   . Skin cancer    Shingles 2012 Bad case    Past Surgical History:  Procedure Laterality Date  . ABDOMINAL HYSTERECTOMY    . APPENDECTOMY    . CESAREAN SECTION     2  . CHOLECYSTECTOMY    . COLONOSCOPY  08/31/13   invasive squamous cell colon  . ERCP    . ESOPHAGOGASTRODUODENOSCOPY  10/20/2012   Procedure:  ESOPHAGOGASTRODUODENOSCOPY (EGD);  Surgeon: Arta Silence, MD;  Location: [facility];  Service: Endoscopy;  Laterality: Left;  . EYE SURGERY     cataracts  . KNEE ARTHROSCOPY  05/11/2012   Procedure: ARTHROSCOPY KNEE;  Surgeon: Hessie Dibble, MD;  Location: [facility];  Service: Orthopedics;  Laterality: Right;  left knee medial menisectomy and chondroplasty  . THYROIDECTOMY, PARTIAL    . TONSILLECTOMY       reports that she has never smoked. She has never used smokeless tobacco. She reports that she does not drink alcohol and does not use drugs.  Allergies  Allergen Reactions  . Vancomycin Hives    Zosyn and vanc given together; unclear which was precipitating med. Appears to have tolerated cephalosporins in the past.  . Zosyn [Piperacillin Sod-Tazobactam So] Hives    Zosyn and vanc given together; unclear which was precipitating med. Appears to have tolerated cephalosporins in the past.  . Hydrocodone Nausea Only  . Morphine And Related Hives    Family History  Problem Relation Age of Onset  . Kidney disease Mother   . Heart attack Father   . Heart disease Father   . Cancer Sister        breast  . Cancer Sister        ovarian  . Cancer Sister        melanoma  . Cancer Daughter  breast    Prior to Admission medications   Medication Sig Start Date End Date Taking? Authorizing Provider  acetaminophen (TYLENOL) 325 MG tablet Take 650 mg by mouth every 6 (six) hours as needed for moderate pain.   Yes [provider]  ALPRAZolam (XANAX) 0.25 MG tablet Take 1 tablet by mouth daily. 04/04/14  Yes [provider]  amLODipine (NORVASC) 5 MG tablet Take 1 tablet (5 mg total) by mouth daily. 07/13/20  Yes Dahal, Marlowe Aschoff, MD  buPROPion (WELLBUTRIN SR) 200 MG 12 hr tablet Take 200 mg by mouth daily.    Yes [provider]  docusate sodium (COLACE) 100 MG capsule Take 100 mg by mouth 2 (two) times daily.   Yes [provider]   escitalopram (LEXAPRO) 10 MG tablet Take 10 mg by mouth daily.   Yes [provider]  HYDROmorphone (DILAUDID) 2 MG tablet Take 2 mg by mouth every 3 (three) hours as needed for severe pain.   Yes [provider]  levothyroxine (SYNTHROID, LEVOTHROID) 88 MCG tablet Take 88 mcg by mouth daily before breakfast.   Yes [provider]  methocarbamol (ROBAXIN) 500 MG tablet Take 500 mg by mouth 3 (three) times daily.   Yes [provider]  ondansetron (ZOFRAN ODT) 4 MG disintegrating tablet Take 1 tablet (4 mg total) by mouth every 8 (eight) hours as needed for nausea or vomiting. 07/06/17  Yes Sherwood Gambler, MD  pantoprazole (PROTONIX) 40 MG tablet Take 40 mg by mouth daily.   Yes [provider]  polyethylene glycol (MIRALAX / GLYCOLAX) 17 g packet Take 17 g by mouth daily.   Yes [provider]  PREMARIN 0.3 MG tablet Take 0.3 mg by mouth daily. 08/14/16  Yes [provider]  enoxaparin (LOVENOX) 30 MG/0.3ML injection Inject 0.6 mLs (60 mg total) into the skin daily. 07/12/20 08/11/20  Ainsley Spinner, PA-C    Physical Exam: Constitutional: Moderately built and nourished. Vitals:   07/28/20 0300 07/28/20 0400 07/28/20 0500 07/28/20 0600  BP: 120/62 112/63 106/60 93/65  Pulse: 91 84 78 76  Resp: 18 17 20  (!) 22  Temp:      SpO2: 91% 95% 94% 95%  Weight:      Height:       Eyes: Anicteric no pallor.  ENMT: No discharge from the ears eyes nose or mouth. Neck: No mass felt.  No neck rigidity. Respiratory: No rhonchi or crepitations. Cardiovascular: S1-S2 heard. Abdomen: Soft nontender bowel sounds present. Musculoskeletal: No edema. Skin: No rash. Neurologic: Alert awake oriented to time place and person.  Moves all extremities. Psychiatric: Appears normal.  Normal affect.   Labs on Admission: I have personally reviewed following labs and imaging studies  CBC: Recent Labs  Lab 07/28/20 0235  WBC 10.1  NEUTROABS 8.6*  HGB  9.1*  HCT 27.7*  MCV 98.2  PLT 323*   Basic Metabolic Panel: Recent Labs  Lab 07/28/20 0235  NA 131*  K 4.0  CL 96*  CO2 25  GLUCOSE 158*  BUN 17  CREATININE 1.09*  CALCIUM 8.5*   GFR: Estimated Creatinine Clearance: 36.4 mL/min (A) (by C-G formula based on SCr of 1.09 mg/dL (H)). Liver Function Tests: Recent Labs  Lab 07/28/20 0235  AST 40  ALT 37  ALKPHOS 85  BILITOT 0.7  PROT 7.1  ALBUMIN 3.0*   No results for input(s): LIPASE, AMYLASE in the last 168 hours. No results for input(s): AMMONIA in the last 168 hours. Coagulation Profile:  No results for input(s): INR, PROTIME in the last 168 hours. Cardiac Enzymes: No results for input(s): CKTOTAL, CKMB, CKMBINDEX, TROPONINI in the last 168 hours. BNP (last 3 results) No results for input(s): PROBNP in the last 8760 hours. HbA1C: No results for input(s): HGBA1C in the last 72 hours. CBG: No results for input(s): GLUCAP in the last 168 hours. Lipid Profile: No results for input(s): CHOL, HDL, LDLCALC, TRIG, CHOLHDL, LDLDIRECT in the last 72 hours. Thyroid Function Tests: No results for input(s): TSH, T4TOTAL, FREET4, T3FREE, THYROIDAB in the last 72 hours. Anemia Panel: No results for input(s): VITAMINB12, FOLATE, FERRITIN, TIBC, IRON, RETICCTPCT in the last 72 hours. Urine analysis:    Component Value Date/Time   COLORURINE COLORLESS (A) 07/06/2017 1630   APPEARANCEUR CLEAR 07/06/2017 1630   LABSPEC 1.003 (L) 07/06/2017 1630   LABSPEC 1.020 12/02/2013 1329   PHURINE 7.0 07/06/2017 1630   GLUCOSEU NEGATIVE 07/06/2017 1630   GLUCOSEU Negative 12/02/2013 1329   HGBUR SMALL (A) 07/06/2017 1630   BILIRUBINUR NEGATIVE 07/06/2017 1630   BILIRUBINUR Negative 12/02/2013 1329   KETONESUR NEGATIVE 07/06/2017 1630   PROTEINUR NEGATIVE 07/06/2017 1630   UROBILINOGEN 0.2 12/24/2013 1938   UROBILINOGEN 0.2 12/02/2013 1329   NITRITE NEGATIVE 07/06/2017 1630   LEUKOCYTESUR NEGATIVE 07/06/2017 1630   LEUKOCYTESUR Trace  12/02/2013 1329   Sepsis Labs: @LABRCNTIP (procalcitonin:4,lacticidven:4) ) Recent Results (from the past 240 hour(s))  SARS Coronavirus 2 by RT PCR (hospital order, performed in [place] hospital lab) Nasopharyngeal Nasopharyngeal Swab     Status: None   Collection Time: 07/28/20  2:20 AM   Specimen: Nasopharyngeal Swab  Result Value Ref Range Status   SARS Coronavirus 2 NEGATIVE NEGATIVE Final    Comment: (NOTE) SARS-CoV-2 target nucleic acids are NOT DETECTED.  The SARS-CoV-2 RNA is generally detectable in upper and lower respiratory specimens during the acute phase of infection. The lowest concentration of SARS-CoV-2 viral copies this assay can detect is 250 copies / mL. A negative result does not preclude SARS-CoV-2 infection and should not be used as the sole basis for treatment or other patient management decisions.  A negative result may occur with improper specimen collection / handling, submission of specimen other than nasopharyngeal swab, presence of viral mutation(s) within the areas targeted by this assay, and inadequate number of viral copies (<250 copies / mL). A negative result must be combined with clinical observations, patient history, and epidemiological information.  Fact Sheet for Patients:   StrictlyIdeas.no  Fact Sheet for Healthcare Providers: BankingDealers.co.za  This test is not yet approved or  cleared by the [place] FDA and has been authorized for detection and/or diagnosis of SARS-CoV-2 by FDA under an Emergency Use Authorization (EUA).  This EUA will remain in effect (meaning this test can be used) for the duration of the COVID-19 declaration under Section 564(b)(1) of the Act, 21 U.S.C. section 360bbb-3(b)(1), unless the authorization is terminated or revoked sooner.  Performed at [facility], [place] [address], [place], [place] [address]      Radiological Exams on  Admission: CT Angio Chest PE W/Cm &/Or Wo Cm  Result Date: 07/28/2020 CLINICAL DATA:  Hypoxia.  PE suspected, high prob EXAM: CT ANGIOGRAPHY CHEST WITH CONTRAST TECHNIQUE: Multidetector CT imaging of the chest was performed using the standard protocol during bolus administration of intravenous contrast. Multiplanar CT image reconstructions and MIPs were obtained to evaluate the vascular anatomy. CONTRAST:  29mL OMNIPAQUE IOHEXOL 350 MG/ML SOLN COMPARISON:  None. FINDINGS: Cardiovascular: Contrast injection  is sufficient to demonstrate satisfactory opacification of the pulmonary arteries to the segmental level. There is a large burden of pulmonary emboli within the right lung, predominantly within the right middle and lower lobar arteries. There are small pulmonary emboli in the right and left upper lobe segmental branches. There is no evidence of right heart strain with RV/LV ratio of 0.75. the size of the main pulmonary artery is normal. Mild cardiomegaly. The course and caliber of the aorta are normal. There is mild atherosclerotic calcification. Opacification decreased due to pulmonary arterial phase contrast bolus timing. Mediastinum/Nodes: No mediastinal, hilar or axillary lymphadenopathy. Normal visualized thyroid. Thoracic esophageal course is normal. Lungs/Pleura: Small pleural effusions, right greater than left. Bibasilar atelectasis. Upper Abdomen: Contrast bolus timing is not optimized for evaluation of the abdominal organs. The visualized portions of the organs of the upper abdomen are normal. Musculoskeletal: No chest wall abnormality. No bony spinal canal stenosis. Review of the MIP images confirms the above findings. IMPRESSION: 1. Large burden of pulmonary emboli within the right lung, predominantly within the right middle and lower lobar arteries. Small pulmonary emboli in the right and left upper lobe segmental branches. No evidence of right heart strain with RV/LV ratio of 0.75. 2. Small pleural  effusions, right greater than left. 3. Aortic Atherosclerosis (ICD10-I70.0). Critical Value/emergent results were called by telephone at the time of interpretation on 07/28/2020 at 6:00 am to provider IVA KNAPP , who verbally acknowledged these results. Electronically Signed   By: Ulyses Jarred M.D.   On: 07/28/2020 06:03   DG Chest Port 1 View  Result Date: 07/28/2020 CLINICAL DATA:  Shortness of breath and chest pain EXAM: PORTABLE CHEST 1 VIEW COMPARISON:  07/07/2020 FINDINGS: Cardiac enlargement. Mild interstitial pattern to the lungs with peribronchial thickening. This may represent bronchiolitis or airways disease. No focal consolidation. No pleural effusions. No pneumothorax. Mediastinal contours appear intact. IMPRESSION: Cardiac enlargement. Interstitial pattern to the lungs may represent bronchiolitis or airways disease. Electronically Signed   By: Lucienne Capers M.D.   On: 07/28/2020 02:34    EKG: Independently reviewed.  Normal sinus rhythm.  Assessment/Plan Principal Problem:   Pulmonary embolus (HCC) Active Problems:   Hypothyroidism   Hypertension   Acetabular fracture ([place])   DNR (do not resuscitate)   Dementia without behavioral disturbance ([place])   Anemia   Pulmonary embolism ([place])    1. Acute pulmonary embolism with large clot burden in the right side presently blood pressure is more than 701 systolic.  Discussed with pulmonologist will be seeing patient in consult.  Started patient on heparin will be getting 2D echo.  Trend cardiac markers closely monitor in stepdown. 2. Hypertension we will hold off antihypertensives since patient had large pulmonary embolism. 3. Hypothyroidism on Synthroid. 4. Recent acetabular fracture being managed conservatively on pain relief medication Dilaudid. 5. Anemia hemoglobin appears to be stable when compared to recent. 6. Dementia presently alert awake oriented time place and person.  Since patient has large pulmonary embolism will need  close monitoring for any further worsening in inpatient status.   DVT prophylaxis: Heparin. Code Status: DNR confirmed with patient's daughter. Family Communication: Patient's daughter. Disposition Plan: To be determined. Consults called: Pulmonary critical care. Admission status: Inpatient.   Rise Patience MD [facility] Pager [phone].  If 7PM-7AM, please contact night-coverage [email or web address] Password [facility]  07/28/2020, 6:38 AM

## 2020-07-28 NOTE — Progress Notes (Signed)
PROGRESS NOTE    Cindy [name]   VZC:[id number]  DOB: 07/11/1930  DOA: 07/28/2020     0  PCP: Josetta Huddle, MD  CC: SOB  Hospital Course: Ms. Zoss is an 86 yo CF with PMH recent right acetabular fracture (has been in rehab but relatively immobile), dementia, hypothyroidism, arthritis, HTN, GERD, remote anal cancer 2014 who presented to the ER with SOB and worsening hypoxia.  She is accompanied by her daughter who also provides further collateral information.  Her daughter states that she has been on oxygen while in rehab due to her also being on Dilaudid but that she has not been O2 dependent prior to discharging to rehab for treatment of her fracture.  She underwent CTA chest in the ER and was found to have bilateral pulmonary emboli involving the right middle and lobar arteries as well as the right and left upper lobes (segmental branches). She underwent echo on admission as well which revealed EF 30 to 35%, no RWMA, grade 2 diastolic dysfunction, moderately elevated PA pressure.  RV size normal, normal RV systolic function.  No obvious heart strain. Hemodynamically she remained stable, ER vitals included temp 99, heart rate 97, respirations 16, BP 122/62, 95% on 2 L oxygen.  She was started on a heparin drip and pulmonology was also consulted given her submassive PE.  Recommendations were to continue on heparin drip.  She was not considered a candidate for thrombolytic therapy due to stable vitals as well as higher risk in setting of bleeding complications from her underlying fracture.   Interval History:  Admitted overnight with shortness of breath.  Her daughter is next to her in the ER when seen this morning.  They understand diagnosis of PE and understand that she has been relatively immobile while in rehab.  Daughter also stated that the patient has been on oxygen since last hospitalization but it was because of her pain medication regimen.  Currently she is on 4 L oxygen in  the ER up from her typical 2 L that she has been on recently. She is endorsing shortness of breath but no obvious chest pain.   Old records reviewed in assessment of this patient  ROS: Constitutional: negative for chills and fevers, Respiratory: positive for Shortness of breath, Cardiovascular: negative for chest pain and Gastrointestinal: negative for abdominal pain  Assessment & Plan: Hypertension -Hold meds for now in setting of submassive PE -will treat if becomes elevated  Hypothyroidism -Continue Synthroid  Acetabular fracture ([place]) - discharged to rehab on 07/12/20 - evaluated by ortho previous hospitalization: "Per orthopedics, patient will be touchdown weightbearing on the right leg for 8 weeks and then she will need to be bed to chair for 6 to 8 weeks" -will need ongoing anticoagulation now in setting of acute PE - continue PT  Dementia without behavioral disturbance ([place]) - mentation stable; she is alert and understands why she is in the hospital   Anemia -Baseline hemoglobin around 9 g/dL, currently remains at baseline -Monitor hemoglobin while on heparin drip  Pulmonary embolus ([place]) -Bilateral PE in setting of significant immobility from recent right acetabular fracture.  Discharged from the hospital on 07/12/2020 to rehab.  She was already on oxygen, 2 L at rehab which her daughter states that was initiated in the setting of her being on Dilaudid for pain control but she has not had an oxygen requirement prior to rehab/previous hospitalization -Currently on 4 L oxygen, will wean as able -Continue on heparin drip and  will transition to oral anticoagulation in the next 24 to 48 hours or as per pulmonology recommends -Follow-up lower extremity duplex -Echo obtained, no obvious right heart strain.  EF is reduced, 30 to 35%, Gr II DD (last echo May 2018, EF 55-60%, Gr 1 DD).  - troponin significantly elevated likely due to clot burden; EKG reviewed from admission (NSR, no  obvious ischemia); continue trending troponin   Acute respiratory failure with hypoxia ([facility]) - currently on 4L in ER - now etiology is considered due to acute PE and clot burden; previously was on O2 at rehab per daughter because patient "was on dilaudid also" - wean as able but given PE, may be long term wean   Antimicrobials: None  DVT prophylaxis: Heparin drip Code Status: DNR Family Communication: Daughter bedside Disposition Plan:  Status is: Inpatient  Remains inpatient appropriate because:Unsafe d/c plan, IV treatments appropriate due to intensity of illness or inability to take PO and Inpatient level of care appropriate due to severity of illness   Dispo: The patient is from: SNF              Anticipated d/c is to: SNF              Anticipated d/c date is: 2 days              Patient currently is not medically stable to d/c.       Objective: Blood pressure (!) 121/59, pulse 78, temperature 98.5 F (36.9 C), temperature source Oral, resp. rate 15, height 5\' 6"  (1.676 m), weight 61.2 kg, SpO2 96 %.  Examination: General appearance: Pleasant elderly woman resting in bed in no distress with daughter bedside.  Oxygen in place Head: Normocephalic, without obvious abnormality, atraumatic Eyes: EOMI Lungs: mostly clear anterior breath sounds Heart: regular rate and rhythm and S1, S2 normal Abdomen: normal findings: bowel sounds normal and soft, non-tender Extremities: no edema, no leg/calf swelling Skin: mobility and turgor normal Neurologic: no focal deficits but RLE mobility/strength limited by pain  Consultants:   Pulm  Procedures:   n/a  Data Reviewed: I have personally reviewed following labs and imaging studies Results for orders placed or performed during the hospital encounter of 07/28/20 (from the past 24 hour(s))  SARS Coronavirus 2 by RT PCR (hospital order, performed in [place] hospital lab) Nasopharyngeal Nasopharyngeal Swab     Status: None    Collection Time: 07/28/20  2:20 AM   Specimen: Nasopharyngeal Swab  Result Value Ref Range   SARS Coronavirus 2 NEGATIVE NEGATIVE  Comprehensive metabolic panel     Status: Abnormal   Collection Time: 07/28/20  2:35 AM  Result Value Ref Range   Sodium 131 (L) 135 - 145 mmol/L   Potassium 4.0 3.5 - 5.1 mmol/L   Chloride 96 (L) 98 - 111 mmol/L   CO2 25 22 - 32 mmol/L   Glucose, Bld 158 (H) 70 - 99 mg/dL   BUN 17 8 - 23 mg/dL   Creatinine, Ser 1.09 (H) 0.44 - 1.00 mg/dL   Calcium 8.5 (L) 8.9 - 10.3 mg/dL   Total Protein 7.1 6.5 - 8.1 g/dL   Albumin 3.0 (L) 3.5 - 5.0 g/dL   AST 40 15 - 41 U/L   ALT 37 0 - 44 U/L   Alkaline Phosphatase 85 38 - 126 U/L   Total Bilirubin 0.7 0.3 - 1.2 mg/dL   GFR calc non Af Amer 45 (L) >60 mL/min   GFR calc Af Wy[place]nia Lora  52 (L) >60 mL/min   Anion gap 10 5 - 15  Brain natriuretic peptide     Status: Abnormal   Collection Time: 07/28/20  2:35 AM  Result Value Ref Range   B Natriuretic Peptide 275.6 (H) 0.0 - 100.0 pg/mL  CBC with Differential     Status: Abnormal   Collection Time: 07/28/20  2:35 AM  Result Value Ref Range   WBC 10.1 4.0 - 10.5 K/uL   RBC 2.82 (L) 3.87 - 5.11 MIL/uL   Hemoglobin 9.1 (L) 12.0 - 15.0 g/dL   HCT 27.7 (L) 36 - 46 %   MCV 98.2 80.0 - 100.0 fL   MCH 32.3 26.0 - 34.0 pg   MCHC 32.9 30.0 - 36.0 g/dL   RDW 12.3 11.5 - 15.5 %   Platelets 405 (H) 150 - 400 K/uL   nRBC 0.0 0.0 - 0.2 %   Neutrophils Relative % 87 %   Neutro Abs 8.6 (H) 1.7 - 7.7 K/uL   Lymphocytes Relative 8 %   Lymphs Abs 0.8 0.7 - 4.0 K/uL   Monocytes Relative 5 %   Monocytes Absolute 0.5 0 - 1 K/uL   Eosinophils Relative 0 %   Eosinophils Absolute 0.0 0 - 0 K/uL   Basophils Relative 0 %   Basophils Absolute 0.0 0 - 0 K/uL   Immature Granulocytes 0 %   Abs Immature Granulocytes 0.04 0.00 - 0.07 K/uL  Troponin I (High Sensitivity)     Status: Abnormal   Collection Time: 07/28/20  2:35 AM  Result Value Ref Range   Troponin I (High Sensitivity) 2,166 (HH)  <18 ng/L  D-dimer, quantitative     Status: Abnormal   Collection Time: 07/28/20  2:35 AM  Result Value Ref Range   D-Dimer, Quant 16.59 (H) 0.00 - 0.50 ug/mL-FEU  Troponin I (High Sensitivity)     Status: Abnormal   Collection Time: 07/28/20  4:52 AM  Result Value Ref Range   Troponin I (High Sensitivity) 4,088 (HH) <18 ng/L  APTT     Status: None   Collection Time: 07/28/20  6:36 AM  Result Value Ref Range   aPTT 30 24 - 36 seconds  Protime-INR     Status: None   Collection Time: 07/28/20  6:36 AM  Result Value Ref Range   Prothrombin Time 14.9 11.4 - 15.2 seconds   INR 1.2 0.8 - 1.2  Brain natriuretic peptide     Status: Abnormal   Collection Time: 07/28/20  6:48 AM  Result Value Ref Range   B Natriuretic Peptide 597.5 (H) 0.0 - 100.0 pg/mL  Lactic acid, plasma     Status: None   Collection Time: 07/28/20  6:48 AM  Result Value Ref Range   Lactic Acid, Venous 1.4 0.5 - 1.9 mmol/L  Troponin I (High Sensitivity)     Status: Abnormal   Collection Time: 07/28/20  6:48 AM  Result Value Ref Range   Troponin I (High Sensitivity) 5,263 (HH) <18 ng/L  POC occult blood, ED RN will collect     Status: None   Collection Time: 07/28/20  7:23 AM  Result Value Ref Range   Fecal Occult Bld NEGATIVE NEGATIVE    Recent Results (from the past 240 hour(s))  SARS Coronavirus 2 by RT PCR (hospital order, performed in [facility] hospital lab) Nasopharyngeal Nasopharyngeal Swab     Status: None   Collection Time: 07/28/20  2:20 AM   Specimen: Nasopharyngeal Swab  Result Value Ref Range  Status   SARS Coronavirus 2 NEGATIVE NEGATIVE Final    Comment: (NOTE) SARS-CoV-2 target nucleic acids are NOT DETECTED.  The SARS-CoV-2 RNA is generally detectable in upper and lower respiratory specimens during the acute phase of infection. The lowest concentration of SARS-CoV-2 viral copies this assay can detect is 250 copies / mL. A negative result does not preclude SARS-CoV-2 infection and should not  be used as the sole basis for treatment or other patient management decisions.  A negative result may occur with improper specimen collection / handling, submission of specimen other than nasopharyngeal swab, presence of viral mutation(s) within the areas targeted by this assay, and inadequate number of viral copies (<250 copies / mL). A negative result must be combined with clinical observations, patient history, and epidemiological information.  Fact Sheet for Patients:   StrictlyIdeas.no  Fact Sheet for Healthcare Providers: BankingDealers.co.za  This test is not yet approved or  cleared by the [place] FDA and has been authorized for detection and/or diagnosis of SARS-CoV-2 by FDA under an Emergency Use Authorization (EUA).  This EUA will remain in effect (meaning this test can be used) for the duration of the COVID-19 declaration under Section 564(b)(1) of the Act, 21 U.S.C. section 360bbb-3(b)(1), unless the authorization is terminated or revoked sooner.  Performed at [facility], [place] [address], [place], [place] [address]      Radiology Studies: CT Angio Chest PE W/Cm &/Or Wo Cm  Result Date: 07/28/2020 CLINICAL DATA:  Hypoxia.  PE suspected, high prob EXAM: CT ANGIOGRAPHY CHEST WITH CONTRAST TECHNIQUE: Multidetector CT imaging of the chest was performed using the standard protocol during bolus administration of intravenous contrast. Multiplanar CT image reconstructions and MIPs were obtained to evaluate the vascular anatomy. CONTRAST:  8mL OMNIPAQUE IOHEXOL 350 MG/ML SOLN COMPARISON:  None. FINDINGS: Cardiovascular: Contrast injection is sufficient to demonstrate satisfactory opacification of the pulmonary arteries to the segmental level. There is a large burden of pulmonary emboli within the right lung, predominantly within the right middle and lower lobar arteries. There are small pulmonary emboli in the  right and left upper lobe segmental branches. There is no evidence of right heart strain with RV/LV ratio of 0.75. the size of the main pulmonary artery is normal. Mild cardiomegaly. The course and caliber of the aorta are normal. There is mild atherosclerotic calcification. Opacification decreased due to pulmonary arterial phase contrast bolus timing. Mediastinum/Nodes: No mediastinal, hilar or axillary lymphadenopathy. Normal visualized thyroid. Thoracic esophageal course is normal. Lungs/Pleura: Small pleural effusions, right greater than left. Bibasilar atelectasis. Upper Abdomen: Contrast bolus timing is not optimized for evaluation of the abdominal organs. The visualized portions of the organs of the upper abdomen are normal. Musculoskeletal: No chest wall abnormality. No bony spinal canal stenosis. Review of the MIP images confirms the above findings. IMPRESSION: 1. Large burden of pulmonary emboli within the right lung, predominantly within the right middle and lower lobar arteries. Small pulmonary emboli in the right and left upper lobe segmental branches. No evidence of right heart strain with RV/LV ratio of 0.75. 2. Small pleural effusions, right greater than left. 3. Aortic Atherosclerosis (ICD10-I70.0). Critical Value/emergent results were called by telephone at the time of interpretation on 07/28/2020 at 6:00 am to provider IVA KNAPP , who verbally acknowledged these results. Electronically Signed   By: Ulyses Jarred M.D.   On: 07/28/2020 06:03   DG Chest Port 1 View  Result Date: 07/28/2020 CLINICAL DATA:  Shortness of breath and chest pain  EXAM: PORTABLE CHEST 1 VIEW COMPARISON:  07/07/2020 FINDINGS: Cardiac enlargement. Mild interstitial pattern to the lungs with peribronchial thickening. This may represent bronchiolitis or airways disease. No focal consolidation. No pleural effusions. No pneumothorax. Mediastinal contours appear intact. IMPRESSION: Cardiac enlargement. Interstitial pattern to the  lungs may represent bronchiolitis or airways disease. Electronically Signed   By: Lucienne Capers M.D.   On: 07/28/2020 02:34   ECHOCARDIOGRAM COMPLETE  Result Date: 07/28/2020    ECHOCARDIOGRAM REPORT   Patient Name:   RAZIA SCREWS Date of Exam: 07/28/2020 Medical Rec #:  [id number]          Height:       66.0 in Accession #:    [id number]         Weight:       160.0 lb Date of Birth:  1930/05/04          BSA:          1.819 m Patient Age:    89 years           BP:           119/66 mmHg Patient Gender: F                  HR:           78 bpm. Exam Location:  Inpatient Procedure: 2D Echo and Intracardiac Opacification Agent Indications:    Pulmonary Embolus I26.99  History:        Patient has prior history of Echocardiogram examinations, most                 recent 04/28/2017. Risk Factors:Hypertension and Dyslipidemia.  Sonographer:    Mikki Santee RDCS (AE) Referring Phys: [place]  1. Left ventricular ejection fraction, by estimation, is 30-35%. The left ventricle has mildly decreased function. The left ventricle has no regional wall motion abnormalities. Left ventricular diastolic parameters are consistent with Grade II diastolic  dysfunction (pseudonormalization). Elevated left ventricular end-diastolic pressure. There is possible akinesis of the left ventricular, apical septal wall, apical segment, anterior wall and inferolateral wall. There is akinesis of the left ventricular,  mid anteroseptal wall.  2. Right ventricular systolic function is normal. The right ventricular size is normal. There is moderately elevated pulmonary artery systolic pressure. The estimated right ventricular systolic pressure is 62.1 mmHg.  3. The mitral valve is normal in structure. Mild mitral valve regurgitation. No evidence of mitral stenosis.  4. The aortic valve is normal in structure. Aortic valve regurgitation is not visualized. Mild to moderate aortic valve sclerosis/calcification is  present, without any evidence of aortic stenosis.  5. The inferior vena cava is normal in size with greater than 50% respiratory variability, suggesting right atrial pressure of 3 mmHg.  6. Definity contrast study has been ordered to confirm wall motion abnormalities. FINDINGS  Left Ventricle: Left ventricular ejection fraction, by estimation, is 30 to 35%. The left ventricle has moderately decreased function. The left ventricle has no regional wall motion abnormalities. Definity contrast agent was given IV to delineate the left ventricular endocardial borders. The left ventricular internal cavity size was normal in size. There is no left ventricular hypertrophy. Left ventricular diastolic parameters are consistent with Grade II diastolic dysfunction (pseudonormalization). Elevated left ventricular end-diastolic pressure. Right Ventricle: The right ventricular size is normal. No increase in right ventricular wall thickness. Right ventricular systolic function is normal. There is moderately elevated pulmonary artery systolic pressure. The tricuspid regurgitant velocity is  3.11 m/s, and with an assumed right atrial pressure of 3 mmHg, the estimated right ventricular systolic pressure is 88.9 mmHg. Left Atrium: Left atrial size was normal in size. Right Atrium: Right atrial size was normal in size. Pericardium: There is no evidence of pericardial effusion. Mitral Valve: The mitral valve is normal in structure. Normal mobility of the mitral valve leaflets. Mild mitral valve regurgitation. No evidence of mitral valve stenosis. Tricuspid Valve: The tricuspid valve is normal in structure. Tricuspid valve regurgitation is mild . No evidence of tricuspid stenosis. Aortic Valve: The aortic valve is normal in structure. Aortic valve regurgitation is not visualized. Mild to moderate aortic valve sclerosis/calcification is present, without any evidence of aortic stenosis. Pulmonic Valve: The pulmonic valve was normal in  structure. Pulmonic valve regurgitation is not visualized. No evidence of pulmonic stenosis. Aorta: The aortic root is normal in size and structure. Venous: The inferior vena cava is normal in size with greater than 50% respiratory variability, suggesting right atrial pressure of 3 mmHg. IAS/Shunts: No atrial level shunt detected by color flow Doppler.  LEFT VENTRICLE PLAX 2D LVIDd:         4.40 cm  Diastology LVIDs:         2.60 cm  LV e' lateral:   7.51 cm/s LV PW:         1.10 cm  LV E/e' lateral: 14.0 LV IVS:        1.00 cm  LV e' medial:    5.45 cm/s LVOT diam:     2.10 cm  LV E/e' medial:  19.3 LV SV:         62 LV SV Index:   34 LVOT Area:     3.46 cm  RIGHT VENTRICLE RV S prime:     11.80 cm/s TAPSE (M-mode): 1.6 cm LEFT ATRIUM             Index       RIGHT ATRIUM           Index LA diam:        3.70 cm 2.03 cm/m  RA Area:     14.40 cm LA Vol (A2C):   64.2 ml 35.30 ml/m RA Volume:   32.90 ml  18.09 ml/m LA Vol (A4C):   47.5 ml 26.12 ml/m LA Biplane Vol: 57.4 ml 31.56 ml/m  AORTIC VALVE LVOT Vmax:   81.10 cm/s LVOT Vmean:  54.800 cm/s LVOT VTI:    0.180 m  AORTA Ao Root diam: 2.60 cm Ao Asc diam:  3.20 cm MITRAL VALVE                TRICUSPID VALVE MV Area (PHT): 3.77 cm     TR Peak grad:   38.7 mmHg MV Decel Time: 201 msec     TR Vmax:        311.00 cm/s MR Peak grad: 92.2 mmHg MR Mean grad: 68.0 mmHg     SHUNTS MR Vmax:      480.00 cm/s   Systemic VTI:  0.18 m MR Vmean:     401.0 cm/s    Systemic Diam: 2.10 cm MV E velocity: 105.00 cm/s MV A velocity: 74.70 cm/s MV E/A ratio:  1.41 Fransico Him MD Electronically signed by Fransico Him MD Signature Date/Time: 07/28/2020/2:41:55 PM    Final    CT Angio Chest PE W/Cm &/Or Wo Cm  Final Result    DG Chest [facility] 1 View  Final Result    VAS  [place] LOWER EXTREMITY VENOUS (DVT)    (Results Pending)     Scheduled Meds: . ALPRAZolam  0.25 mg Oral QHS  . buPROPion  200 mg Oral Daily  . docusate sodium  100 mg Oral BID  . escitalopram  10 mg Oral Daily   . levothyroxine  88 mcg Oral QAC breakfast  . methocarbamol  500 mg Oral TID  . pantoprazole  40 mg Oral Daily  . polyethylene glycol  17 g Oral Daily  . sodium chloride (PF)       PRN Meds: HYDROmorphone, ondansetron **OR** ondansetron (ZOFRAN) IV, perflutren lipid microspheres (DEFINITY) IV suspension Continuous Infusions: . heparin 1,000 Units/hr (07/28/20 0820)      LOS: 0 days  Time spent: Greater than 50% of the 35 minute visit was spent in counseling/coordination of care for the patient as laid out in the A&P.   Dwyane Dee, MD [facility] 07/28/2020, 3:39 PM   Contact via secure chat.  To contact the attending provider between 7A-7P or the covering provider during after hours 7P-7A, please log into the web site [email or web address] and access using universal [place] password for that web site. If you do not have the password, please call the hospital operator.

## 2020-07-28 NOTE — Assessment & Plan Note (Signed)
-   mentation stable; she is alert and understands why she is in the hospital

## 2020-07-28 NOTE — Assessment & Plan Note (Signed)
-  Hold meds for now in setting of submassive PE -will treat if becomes elevated

## 2020-07-28 NOTE — Assessment & Plan Note (Addendum)
-  Baseline hemoglobin around 9 g/dL, currently remains at baseline -Monitor hemoglobin while on anticoagulation

## 2020-07-28 NOTE — ED Provider Notes (Signed)
[facility] DEPT Provider Note   CSN: [id number] Arrival date & time: 07/28/20  0134   Time seen 2:05 AM  History Chief Complaint  Patient presents with  . Shortness of Breath    Cindy [name] is a 86 y.o. female.  HPI   Patient was discharged from the hospital on July 21 after a fall at home suffering a acetabular fracture and after discussion with palliative care family and patient decided that she would not do surgical treatment she has been in a rehab facility since that time.  She states today she started having a dry cough without fever.  She feels short of breath although she is already on oxygen 2 L/min nasal cannula.  She states she had chest pain in the mid afternoon that lasted about 45 minutes.  She states showed me it is in the left anterior central portion of her chest and she described it as indigestion.  She denies nausea, vomiting, sore throat, or diarrhea.  She also denies wheezing or swelling of her legs.  She started having some rhinorrhea today.  She denies prior history of asthma or COPD but she states she has used inhalers in the past.  PCP Josetta Huddle, MD   Patient is DO NOT RESUSCITATE  Past Medical History:  Diagnosis Date  . Arthritis   . Cancer ([place])    skin  . Colon cancer ([place]) 08/31/13   invasive squamous cell  . Deaf, left   . Depression   . GERD (gastroesophageal reflux disease)   . History of radiation therapy 10/10/13-11/23/13   anal canal 54GY/  . Hyperlipemia   . Hypertension   . Hypothyroidism   . PONV (postoperative nausea and vomiting)   . Skin cancer    Shingles 2012 Bad case    Patient Active Problem List   Diagnosis Date Noted  . Pulmonary embolus ([place]) 07/28/2020  . Anemia 07/28/2020  . Pulmonary embolism ([place]) 07/28/2020  . Dementia without behavioral disturbance ([place])   . DNR (do not resuscitate)   . Palliative care by specialist   . Adult failure to thrive   . Acetabular fracture  ([place]) 07/07/2020  . Sepsis ([place]) 03/09/2017  . Nausea vomiting and diarrhea 03/09/2017  . Elevated troponin 03/09/2017  . Chest pain 12/25/2013  . Dyspnea 12/25/2013  . Restless leg syndrome 12/25/2013  . Mucositis 10/27/2013  . Hypophosphatemia 10/27/2013  . Protein-calorie malnutrition, severe ([place]) 10/25/2013  . Thrombocytopenia ([place]) 10/25/2013  . Hypomagnesemia 10/24/2013  . Febrile neutropenia ([place]) 10/23/2013  . Anal cancer ([place]) 09/09/2013  . Colon cancer ([place]) 08/31/2013  . Colitis, acute 03/17/2013  . Gastroparesis 10/22/2012  . Hand pain, left 10/22/2012  . Anxiety 10/18/2012  . Hyperventilation syndrome 10/18/2012  . Headache(784.0) 10/18/2012  . Neck pain on right side 10/18/2012  . Hypertension 10/18/2012  . GERD (gastroesophageal reflux disease) 10/18/2012  . Hyperlipidemia 10/18/2012  . Insomnia 10/18/2012  . Hearing loss sensory, bilateral 10/18/2012  . 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 Weakness generalized 10/17/2012  . Hyponatremia 10/17/2012  . Hypokalemia 10/17/2012  . Hypothyroidism 10/17/2012  . Fatigue 10/17/2012    Past Surgical History:  Procedure Laterality Date  . ABDOMINAL HYSTERECTOMY    . APPENDECTOMY    . CESAREAN SECTION     2  . CHOLECYSTECTOMY    . COLONOSCOPY  08/31/13   invasive squamous cell colon  . ERCP    . ESOPHAGOGASTRODUODENOSCOPY  10/20/2012   Procedure: ESOPHAGOGASTRODUODENOSCOPY (EGD);  Surgeon: Arta Silence, MD;  Location: [facility];  Service: Endoscopy;  Laterality: Left;  . EYE SURGERY     cataracts  . KNEE ARTHROSCOPY  05/11/2012   Procedure: ARTHROSCOPY KNEE;  Surgeon: Hessie Dibble, MD;  Location: [facility];  Service: Orthopedics;  Laterality: Right;  left knee medial menisectomy and chondroplasty  . THYROIDECTOMY, PARTIAL    . TONSILLECTOMY       OB History   No obstetric history on file.     Family History  Problem Relation Age of Onset  . Kidney disease Mother    . Heart attack Father   . Heart disease Father   . Cancer Sister        breast  . Cancer Sister        ovarian  . Cancer Sister        melanoma  . Cancer Daughter        breast    Social History   Tobacco Use  . Smoking status: Never Smoker  . Smokeless tobacco: Never Used  Substance Use Topics  . Alcohol use: No  . Drug use: No  staying in SNF at this time  Home Medications Prior to Admission medications   Medication Sig Start Date End Date Taking? Authorizing Provider  acetaminophen (TYLENOL) 325 MG tablet Take 650 mg by mouth every 6 (six) hours as needed for moderate pain.   Yes [provider]  ALPRAZolam (XANAX) 0.25 MG tablet Take 1 tablet by mouth daily. 04/04/14  Yes [provider]  amLODipine (NORVASC) 5 MG tablet Take 1 tablet (5 mg total) by mouth daily. 07/13/20  Yes Dahal, Marlowe Aschoff, MD  buPROPion (WELLBUTRIN SR) 200 MG 12 hr tablet Take 200 mg by mouth daily.    Yes [provider]  docusate sodium (COLACE) 100 MG capsule Take 100 mg by mouth 2 (two) times daily.   Yes [provider]  escitalopram (LEXAPRO) 10 MG tablet Take 10 mg by mouth daily.   Yes [provider]  HYDROmorphone (DILAUDID) 2 MG tablet Take 2 mg by mouth every 3 (three) hours as needed for severe pain.   Yes [provider]  levothyroxine (SYNTHROID, LEVOTHROID) 88 MCG tablet Take 88 mcg by mouth daily before breakfast.   Yes [provider]  methocarbamol (ROBAXIN) 500 MG tablet Take 500 mg by mouth 3 (three) times daily.   Yes [provider]  ondansetron (ZOFRAN ODT) 4 MG disintegrating tablet Take 1 tablet (4 mg total) by mouth every 8 (eight) hours as needed for nausea or vomiting. 07/06/17  Yes Sherwood Gambler, MD  pantoprazole (PROTONIX) 40 MG tablet Take 40 mg by mouth daily.   Yes [provider]  polyethylene glycol (MIRALAX / GLYCOLAX) 17 g packet Take 17 g by mouth daily.   Yes [provider]   PREMARIN 0.3 MG tablet Take 0.3 mg by mouth daily. 08/14/16  Yes [provider]  enoxaparin (LOVENOX) 30 MG/0.3ML injection Inject 0.6 mLs (60 mg total) into the skin daily. 07/12/20 08/11/20  Ainsley Spinner, PA-C    Allergies    Vancomycin, Zosyn [piperacillin sod-tazobactam so], Hydrocodone, and Morphine and related  Review of Systems   Review of Systems  All other systems reviewed and are negative.   Physical Exam Updated Vital Signs BP 93/65   Pulse 76   Temp 99 F (37.2 C)   Resp (!) 22   Ht 5\' 6"  (1.676 m)   Wt 72.6 kg  SpO2 95%   BMI 25.82 kg/m   Physical Exam Vitals and nursing note reviewed.  Constitutional:      General: She is not in acute distress.    Appearance: Normal appearance. She is obese.  HENT:     Head: Normocephalic and atraumatic.     Right Ear: External ear normal.     Left Ear: External ear normal.  Eyes:     Extraocular Movements: Extraocular movements intact.     Conjunctiva/sclera: Conjunctivae normal.     Pupils: Pupils are equal, round, and reactive to light.  Cardiovascular:     Rate and Rhythm: Normal rate and regular rhythm.     Heart sounds: Murmur heard.   Pulmonary:     Effort: Tachypnea present. No accessory muscle usage, prolonged expiration or respiratory distress.     Breath sounds: Examination of the right-lower field reveals rales. Examination of the left-lower field reveals rales. Decreased breath sounds and rales present.  Abdominal:     General: Abdomen is flat. Bowel sounds are normal.     Palpations: Abdomen is soft.     Tenderness: There is no abdominal tenderness.  Musculoskeletal:     Cervical back: Normal range of motion.     Right lower leg: No edema.     Left lower leg: No edema.  Skin:    General: Skin is warm and dry.  Neurological:     General: No focal deficit present.     Mental Status: She is alert and oriented to person, place, and time.     Cranial Nerves: No cranial nerve deficit.   Psychiatric:        Mood and Affect: Mood normal.        Behavior: Behavior normal.        Thought Content: Thought content normal.     ED Results / Procedures / Treatments   Labs (all labs ordered are listed, but only abnormal results are displayed) Results for orders placed or performed during the hospital encounter of 07/28/20  SARS Coronavirus 2 by RT PCR (hospital order, performed in [facility] hospital lab) Nasopharyngeal Nasopharyngeal Swab   Specimen: Nasopharyngeal Swab  Result Value Ref Range   SARS Coronavirus 2 NEGATIVE NEGATIVE  Comprehensive metabolic panel  Result Value Ref Range   Sodium 131 (L) 135 - 145 mmol/L   Potassium 4.0 3.5 - 5.1 mmol/L   Chloride 96 (L) 98 - 111 mmol/L   CO2 25 22 - 32 mmol/L   Glucose, Bld 158 (H) 70 - 99 mg/dL   BUN 17 8 - 23 mg/dL   Creatinine, Ser 1.09 (H) 0.44 - 1.00 mg/dL   Calcium 8.5 (L) 8.9 - 10.3 mg/dL   Total Protein 7.1 6.5 - 8.1 g/dL   Albumin 3.0 (L) 3.5 - 5.0 g/dL   AST 40 15 - 41 U/L   ALT 37 0 - 44 U/L   Alkaline Phosphatase 85 38 - 126 U/L   Total Bilirubin 0.7 0.3 - 1.2 mg/dL   GFR calc non Af Amer 45 (L) >60 mL/min   GFR calc Af Amer 52 (L) >60 mL/min   Anion gap 10 5 - 15  Brain natriuretic peptide  Result Value Ref Range   B Natriuretic Peptide 275.6 (H) 0.0 - 100.0 pg/mL  CBC with Differential  Result Value Ref Range   WBC 10.1 4.0 - 10.5 K/uL   RBC 2.82 (L) 3.87 - 5.11 MIL/uL   Hemoglobin 9.1 (L) 12.0 - 15.0 g/dL  HCT 27.7 (L) 36 - 46 %   MCV 98.2 80.0 - 100.0 fL   MCH 32.3 26.0 - 34.0 pg   MCHC 32.9 30.0 - 36.0 g/dL   RDW 12.3 11.5 - 15.5 %   Platelets 405 (H) 150 - 400 K/uL   nRBC 0.0 0.0 - 0.2 %   Neutrophils Relative % 87 %   Neutro Abs 8.6 (H) 1.7 - 7.7 K/uL   Lymphocytes Relative 8 %   Lymphs Abs 0.8 0.7 - 4.0 K/uL   Monocytes Relative 5 %   Monocytes Absolute 0.5 0 - 1 K/uL   Eosinophils Relative 0 %   Eosinophils Absolute 0.0 0 - 0 K/uL   Basophils Relative 0 %   Basophils Absolute  0.0 0 - 0 K/uL   Immature Granulocytes 0 %   Abs Immature Granulocytes 0.04 0.00 - 0.07 K/uL  D-dimer, quantitative  Result Value Ref Range   D-Dimer, Quant 16.59 (H) 0.00 - 0.50 ug/mL-FEU  APTT  Result Value Ref Range   aPTT 30 24 - 36 seconds  Protime-INR  Result Value Ref Range   Prothrombin Time 14.9 11.4 - 15.2 seconds   INR 1.2 0.8 - 1.2  Troponin I (High Sensitivity)  Result Value Ref Range   Troponin I (High Sensitivity) 2,166 (HH) <18 ng/L  Troponin I (High Sensitivity)  Result Value Ref Range   Troponin I (High Sensitivity) 4,088 (HH) <18 ng/L   Laboratory interpretation all normal except very elevated D-dimer, stable anemia, initial troponin very elevated, delta troponin is even more positive, mild hyponatremia, nonfasting hyperglycemia, improving renal insufficiency compared to prior.  Minor elevation of BNP.    EKG EKG Interpretation  Date/Time:  Saturday July 28 2020 02:34:23 EDT Ventricular Rate:  93 PR Interval:    QRS Duration: 91 QT Interval:  380 QTC Calculation: 473 R Axis:   31 Text Interpretation: Sinus rhythm Probable anterior infarct, old Nonspecific T abnormalities, lateral leads No significant change since last tracing 07 Jul 2020 Confirmed by Rolland Porter [phone]) on 07/28/2020 3:31:30 AM   Radiology CT Angio Chest PE W/Cm &/Or Wo Cm  Result Date: 07/28/2020 CLINICAL DATA:  Hypoxia.  PE suspected, high prob EXAM: CT ANGIOGRAPHY CHEST WITH CONTRAST TECHNIQUE: Multidetector CT imaging of the chest was performed using the standard protocol during bolus administration of intravenous contrast. Multiplanar CT image reconstructions and MIPs were obtained to evaluate the vascular anatomy. CONTRAST:  48mL OMNIPAQUE IOHEXOL 350 MG/ML SOLN COMPARISON:  None. FINDINGS: Cardiovascular: Contrast injection is sufficient to demonstrate satisfactory opacification of the pulmonary arteries to the segmental level. There is a large burden of pulmonary emboli within the  right lung, predominantly within the right middle and lower lobar arteries. There are small pulmonary emboli in the right and left upper lobe segmental branches. There is no evidence of right heart strain with RV/LV ratio of 0.75. the size of the main pulmonary artery is normal. Mild cardiomegaly. The course and caliber of the aorta are normal. There is mild atherosclerotic calcification. Opacification decreased due to pulmonary arterial phase contrast bolus timing. Mediastinum/Nodes: No mediastinal, hilar or axillary lymphadenopathy. Normal visualized thyroid. Thoracic esophageal course is normal. Lungs/Pleura: Small pleural effusions, right greater than left. Bibasilar atelectasis. Upper Abdomen: Contrast bolus timing is not optimized for evaluation of the abdominal organs. The visualized portions of the organs of the upper abdomen are normal. Musculoskeletal: No chest wall abnormality. No bony spinal canal stenosis. Review of the MIP images confirms the above findings. IMPRESSION: 1.  Large burden of pulmonary emboli within the right lung, predominantly within the right middle and lower lobar arteries. Small pulmonary emboli in the right and left upper lobe segmental branches. No evidence of right heart strain with RV/LV ratio of 0.75. 2. Small pleural effusions, right greater than left. 3. Aortic Atherosclerosis (ICD10-I70.0). Critical Value/emergent results were called by telephone at the time of interpretation on 07/28/2020 at 6:00 am to provider Taiz Bickle , who verbally acknowledged these results. Electronically Signed   By: Ulyses Jarred M.D.   On: 07/28/2020 06:03   DG Chest Port 1 View  Result Date: 07/28/2020 CLINICAL DATA:  Shortness of breath and chest pain EXAM: PORTABLE CHEST 1 VIEW COMPARISON:  07/07/2020 FINDINGS: Cardiac enlargement. Mild interstitial pattern to the lungs with peribronchial thickening. This may represent bronchiolitis or airways disease. No focal consolidation. No pleural  effusions. No pneumothorax. Mediastinal contours appear intact. IMPRESSION: Cardiac enlargement. Interstitial pattern to the lungs may represent bronchiolitis or airways disease. Electronically Signed   By: Lucienne Capers M.D.   On: 07/28/2020 02:34    Procedures .Critical Care Performed by: Rolland Porter, MD Authorized by: Rolland Porter, MD   Critical care provider statement:    Critical care time (minutes):  37   Critical care was necessary to treat or prevent imminent or life-threatening deterioration of the following conditions:  Circulatory failure   Critical care was time spent personally by me on the following activities:  Discussions with consultants, examination of patient, obtaining history from patient or surrogate, ordering and review of laboratory studies, ordering and review of radiographic studies, pulse oximetry and re-evaluation of patient's condition   (including critical care time)  Medications Ordered in ED Medications  sodium chloride (PF) 0.9 % injection (has no administration in time range)  heparin ADULT infusion 100 units/mL (25000 units/259mL sodium chloride 0.45%) (has no administration in time range)  heparin bolus via infusion 2,000 Units (has no administration in time range)  HYDROmorphone (DILAUDID) tablet 2 mg (2 mg Oral Given 07/28/20 0443)  iohexol (OMNIPAQUE) 350 MG/ML injection 80 mL (80 mLs Intravenous Contrast Given 07/28/20 0531)    ED Course  I have reviewed the triage vital signs and the nursing notes.  Pertinent labs & imaging results that were available during my care of the patient were reviewed by me and considered in my medical decision making (see chart for details).    MDM Rules/Calculators/A&P                         Laboratory testing was done to evaluate her shortness of breath.   4:10 AM I went to patient's room to tell her about her test results specifically her D-dimer was very elevated.  Unfortunately she has 2 IVs 1 in each arm but  they are at the level of the wrist.  They will need to be done more at the  antecubital area.  Patient's lab work from July shows her GFR was 37.  Daughter is at bedside stating patient is supposed to be on Dilaudid.  When I look at her discharge summary she was only supposed to be on Dilaudid for 3 days.  When I look at her nursing home records she is on Dilaudid 2 mg every 6 hours and then every 3 hours for breakthrough pain.  She was given 1 dose of Dilaudid 2 mg orally.  I had previously asked the nurse to have the pharmacy tech update her medication list which  has not been done yet.  5:58 AM radiologist called, patient has bilateral PE without evidence of heart strain.  She also has small bilateral pleural effusions right worse than the left.  Pharmacy consult was ordered for her heparin. Hemoccult was ordered to evaluate her anemia.  6:05 AM I talked to the patient and her daughter.  When I review her chart her hemoglobin had been 11.2 when she was admitted on July 17 however after that her hemoglobin dropped into the 9 range.  Daughter was unaware of that.  They are not aware of any history of GI bleeding.  6:31 AM Dr. Hal Hope, hospitalist will admit patient. I went into the room to check her blood pressure because the last one was in the 93/65 with a map of 73. When I checked her blood pressure it was 103/57. Patient states she feels fine.  Dr Eulis Foster will talk to cardiology  Final Clinical Impression(s) / ED Diagnoses Final diagnoses:  Other acute pulmonary embolism without acute cor pulmonale ([place])  Anemia, unspecified type  NSTEMI (non-ST elevated myocardial infarction) [facility])    Rx / DC Orders    Plan admission  Rolland Porter, MD, FACEP Take care   Rolland Porter, MD 07/28/20 [phone]

## 2020-07-28 NOTE — Hospital Course (Addendum)
Cindy [name] is an 86 yo CF with PMH recent right acetabular fracture (has been in rehab but relatively immobile), dementia, hypothyroidism, arthritis, HTN, GERD, remote anal cancer 2014 who presented to the ER with SOB and worsening hypoxia.  She is accompanied by her daughter who also provides further collateral information.  Her daughter states that she has been on oxygen while in rehab due to her also being on Dilaudid but that she has not been O2 dependent prior to discharging to rehab for treatment of her fracture.  She underwent CTA chest in the ER and was found to have bilateral pulmonary emboli involving the right middle and lobar arteries as well as the right and left upper lobes (segmental branches). She underwent echo on admission as well which revealed EF 30 to 35%, no RWMA, grade 2 diastolic dysfunction, moderately elevated PA pressure.  RV size normal, normal RV systolic function.  No obvious heart strain. LE duplex also showed age indeterminate deep vein thrombosis involving a single right peroneal vein.   She was started on a heparin drip and pulmonology was also consulted given her submassive PE.  Recommendations were to continue on heparin drip on admission.  She was not considered a candidate for thrombolytic therapy due to stable vitals as well as higher risk in setting of bleeding complications from her underlying fracture. She was transitioned to Eliquis on 07/29/20 (10mg  BID x 7 days then 5 mg BID for at least 3 months) then will need outpatient followup with pulmonology.   Fracture date: 07/07/20. Per ortho: "Per orthopedics, patient will be touchdown weightbearing on the right leg for 8 weeks and then she will need to be bed to chair for 6 to 8 weeks."

## 2020-07-28 NOTE — Progress Notes (Signed)
  Echocardiogram 2D Echocardiogram has been performed.  Jennette Dubin 07/28/2020, 12:13 PM

## 2020-07-28 NOTE — Progress Notes (Signed)
ANTICOAGULATION CONSULT NOTE - Initial Consult  Pharmacy Consult for Heparin Indication: pulmonary embolus  Allergies  Allergen Reactions  . Vancomycin Hives    Zosyn and vanc given together; unclear which was precipitating med. Appears to have tolerated cephalosporins in the past.  . Zosyn [Piperacillin Sod-Tazobactam So] Hives    Zosyn and vanc given together; unclear which was precipitating med. Appears to have tolerated cephalosporins in the past.  . Hydrocodone Nausea Only  . Morphine And Related Hives    Patient Measurements: Height: 5\' 6"  (167.6 cm) Weight: 72.6 kg (160 lb) IBW/kg (Calculated) : 59.3 Heparin Dosing Weight: TBW  Vital Signs: Temp: 99 F (37.2 C) (08/07 0144) BP: 106/60 (08/07 0500) Pulse Rate: 78 (08/07 0500)  Labs: Recent Labs    07/28/20 0235 07/28/20 0452  HGB 9.1*  --   HCT 27.7*  --   PLT 405*  --   CREATININE 1.09*  --   TROPONINIHS 2,166* 4,088*    Estimated Creatinine Clearance: 36.4 mL/min (A) (by C-G formula based on SCr of 1.09 mg/dL (H)).   Medical History: Past Medical History:  Diagnosis Date  . Arthritis   . Cancer ([place])    skin  . Colon cancer ([place]) 08/31/13   invasive squamous cell  . Deaf, left   . Depression   . GERD (gastroesophageal reflux disease)   . History of radiation therapy 10/10/13-11/23/13   anal canal 54GY/  . Hyperlipemia   . Hypertension   . Hypothyroidism   . PONV (postoperative nausea and vomiting)   . Skin cancer    Shingles 2012 Bad case    Medications:  Infusions:    Assessment: 56 yoF admitted on 8/7 with bilateral PE.  Recent PMH of right Acetabular fx s/p fall on 7/17.  Lovenox 60mg  [place] daily on PTA med list, but last dose unknown as not listed on nursing home MAR.  Hemoglobin remains stable at 9.1 from prior discharge.  Occult blood test pending.  Baseline coags pending. Pharmacy is consulted for heparin dosing.  Goal of Therapy:  Heparin level 0.3-0.7 units/ml Monitor platelets by  anticoagulation protocol: Yes   Plan:  Give heparin 2000 units bolus IV x 1 Start heparin IV infusion at 1000 units/hr Heparin level 8 hours after starting Daily heparin level and CBC Continue to monitor H&H and platelets  Gretta Arab PharmD, BCPS Clinical Pharmacist [facility] main pharmacy [phone] 07/28/2020 6:27 AM

## 2020-07-28 NOTE — Progress Notes (Signed)
Lower extremity venous bilateral study completed.   Preliminary results relayed to MD.  See Cv Proc for preliminary results.   Cindy [name]

## 2020-07-28 NOTE — Assessment & Plan Note (Signed)
-   discharged to rehab on 07/12/20 - evaluated by ortho previous hospitalization: "Per orthopedics, patient will be touchdown weightbearing on the right leg for 8 weeks and then she will need to be bed to chair for 6 to 8 weeks" -will need ongoing anticoagulation now in setting of acute PE - continue PT

## 2020-07-29 DIAGNOSIS — R778 Other specified abnormalities of plasma proteins: Secondary | ICD-10-CM

## 2020-07-29 DIAGNOSIS — I82409 Acute embolism and thrombosis of unspecified deep veins of unspecified lower extremity: Secondary | ICD-10-CM

## 2020-07-29 DIAGNOSIS — I5020 Unspecified systolic (congestive) heart failure: Secondary | ICD-10-CM

## 2020-07-29 DIAGNOSIS — I82451 Acute embolism and thrombosis of right peroneal vein: Secondary | ICD-10-CM

## 2020-07-29 LAB — CBC
HCT: 25.6 % — ABNORMAL LOW (ref 36.0–46.0)
Hemoglobin: 8.1 g/dL — ABNORMAL LOW (ref 12.0–15.0)
MCH: 32 pg (ref 26.0–34.0)
MCHC: 31.6 g/dL (ref 30.0–36.0)
MCV: 101.2 fL — ABNORMAL HIGH (ref 80.0–100.0)
Platelets: 373 10*3/uL (ref 150–400)
RBC: 2.53 MIL/uL — ABNORMAL LOW (ref 3.87–5.11)
RDW: 12.3 % (ref 11.5–15.5)
WBC: 5.9 10*3/uL (ref 4.0–10.5)
nRBC: 0 % (ref 0.0–0.2)

## 2020-07-29 LAB — BASIC METABOLIC PANEL
Anion gap: 10 (ref 5–15)
BUN: 16 mg/dL (ref 8–23)
CO2: 26 mmol/L (ref 22–32)
Calcium: 8.6 mg/dL — ABNORMAL LOW (ref 8.9–10.3)
Chloride: 98 mmol/L (ref 98–111)
Creatinine, Ser: 0.85 mg/dL (ref 0.44–1.00)
GFR calc Af Amer: >60 mL/min (ref >60)
GFR calc non Af Amer: >60 mL/min (ref >60)
Glucose, Bld: 98 mg/dL (ref 70–99)
Potassium: 3.7 mmol/L (ref 3.5–5.1)
Sodium: 134 mmol/L — ABNORMAL LOW (ref 135–145)

## 2020-07-29 LAB — TROPONIN I (HIGH SENSITIVITY)
Troponin I (High Sensitivity): 2315 ng/L (ref <18)
Troponin I (High Sensitivity): 1569 ng/L (ref <18)

## 2020-07-29 LAB — MRSA PCR SCREENING: MRSA by PCR: NEGATIVE

## 2020-07-29 LAB — HEPARIN LEVEL (UNFRACTIONATED): Heparin Unfractionated: 0.53 IU/mL (ref 0.30–0.70)

## 2020-07-29 LAB — MAGNESIUM: Magnesium: 2.1 mg/dL (ref 1.7–2.4)

## 2020-07-29 MED ORDER — HEPARIN (PORCINE) 25000 UT/250ML-% IV SOLN
1150.0000 [IU]/h | INTRAVENOUS | Status: DC
  Administered 2020-07-29: 1150 [IU]/h via INTRAVENOUS
  Filled 2020-07-29: qty 250

## 2020-07-29 MED ORDER — LIP MEDEX EX OINT
TOPICAL_OINTMENT | CUTANEOUS | Status: AC
  Filled 2020-07-29: qty 7

## 2020-07-29 MED ORDER — APIXABAN 5 MG PO TABS: 5.0000 mg | ORAL_TABLET | Freq: Two times a day (BID) | ORAL | Status: DC

## 2020-07-29 MED ORDER — LIP MEDEX EX OINT: TOPICAL_OINTMENT | CUTANEOUS | Status: DC | PRN

## 2020-07-29 MED ORDER — HYDROMORPHONE HCL 1 MG/ML IJ SOLN
1.0000 mg | Freq: Once | INTRAMUSCULAR | Status: AC
  Administered 2020-07-29: 1 mg via INTRAVENOUS
  Filled 2020-07-29: qty 1

## 2020-07-29 MED ORDER — APIXABAN 5 MG PO TABS
10.0000 mg | ORAL_TABLET | Freq: Two times a day (BID) | ORAL | Status: DC
  Administered 2020-07-29 – 2020-07-30 (×3): 10 mg via ORAL
  Filled 2020-07-29 (×2): qty 2
  Filled 2020-07-29: qty 4
  Filled 2020-07-29: qty 2

## 2020-07-29 NOTE — Assessment & Plan Note (Signed)
-   LE duplex: age indeterminate deep vein thrombosis involving a single right peroneal vein. - continue anticoagulation (see PE)

## 2020-07-29 NOTE — Progress Notes (Signed)
Pharmacy Brief Note - Anticoagulation Follow Up:  Pharmacy consulted to dose/monitor heparin drip in this 33 yoF for acute PE.   Assessment:  HL = 0.29 is subtherapeutic on heparin infusion of 1000 units/hr (this was a confirmatory check of previous therapeutic level of 0.44 on same rate)  No complications of therapy noted  Goal: HL 0.3 - 0.7  Plan:  Increase heparin infusion to 1150 units/hr  Check HL 8 hours after rate increase  HL, CBC daily while on heparin   Everette Rank, PharmD 07/29/20 12:04 AM

## 2020-07-29 NOTE — Progress Notes (Signed)
ANTICOAGULATION CONSULT NOTE - follow up  Pharmacy Consult for Heparin Indication: pulmonary embolus  Allergies  Allergen Reactions  . Vancomycin Hives    Zosyn and vanc given together; unclear which was precipitating med. Appears to have tolerated cephalosporins in the past.  . Zosyn [Piperacillin Sod-Tazobactam So] Hives    Zosyn and vanc given together; unclear which was precipitating med. Appears to have tolerated cephalosporins in the past.  . Hydrocodone Nausea Only  . Morphine And Related Hives    Patient Measurements: Height: 5\' 6"  (167.6 cm) Weight: 70.3 kg (154 lb 15.7 oz) IBW/kg (Calculated) : 59.3 Heparin Dosing Weight: TBW  Vital Signs: Temp: 97.8 F (36.6 C) (08/08 0800) Temp Source: Oral (08/08 0800) BP: 152/56 (08/08 0800) Pulse Rate: 71 (08/08 0900)  Labs: Recent Labs    07/28/20 0235 07/28/20 0452 07/28/20 0636 07/28/20 0648 07/28/20 1453 07/28/20 2248 07/29/20 0058 07/29/20 0619  HGB 9.1*  --   --   --   --   --  8.1*  --   HCT 27.7*  --   --   --   --   --  25.6*  --   PLT 405*  --   --   --   --   --  373  --   APTT  --   --  30  --   --   --   --   --   LABPROT  --   --  14.9  --   --   --   --   --   INR  --   --  1.2  --   --   --   --   --   HEPARINUNFRC  --   --   --   --  0.44 0.29*  --  0.53  CREATININE 1.09*  --   --   --   --   --  0.85  --   TROPONINIHS 2,166*   < >  --    < > 4,291*  --  2,315* 1,569*   < > = values in this interval not displayed.    Estimated Creatinine Clearance: 42.8 mL/min (by C-G formula based on SCr of 0.85 mg/dL).   Medical History: Past Medical History:  Diagnosis Date  . Arthritis   . Cancer ([place])    skin  . Colon cancer ([place]) 08/31/13   invasive squamous cell  . Deaf, left   . Depression   . GERD (gastroesophageal reflux disease)   . History of radiation therapy 10/10/13-11/23/13   anal canal 54GY/  . Hyperlipemia   . Hypertension   . Hypothyroidism   . PONV (postoperative nausea and  vomiting)   . Skin cancer    Shingles 2012 Bad case    Medications:  Infusions:  . heparin 1,150 Units/hr (07/29/20 0800)    Assessment: 40 yoF admitted on 8/7 with bilateral PE.  Recent PMH of right Acetabular fx s/p fall on 7/17.  Lovenox 60mg  [place] daily on PTA med list, but last dose unknown as not listed on nursing home MAR.  Hemoglobin remains stable at 9.1 from prior discharge. Pharmacy is consulted for heparin dosing.  Today, 07/29/20  Heparin level therapeutic therapeutic after increase in rate of 1000 to 1150 units/hr early this AM  Hgb 8.1 down from 9.1, Plts 373 - monitor  Troponins elevated  No reported bleeding  Goal of Therapy:  Heparin level 0.3-0.7 units/ml Monitor platelets by anticoagulation protocol: Yes  Plan:   Continue current IV heparin rate of 1150 units/hr  Recheck heparin level at 1400 to confirm continued goal level at current heparin rate  Daily heparin level and CBC   Adrian Saran, PharmD, BCPS 07/29/2020 10:03 AM

## 2020-07-29 NOTE — Progress Notes (Addendum)
PROGRESS NOTE    Cindy [name]   JJO:[id number]  DOB: [birth year]  DOA: 07/28/2020     1  PCP: Josetta Huddle, MD  CC: SOB  Hospital Course: Cindy [name] is an 86 yo CF with PMH recent right acetabular fracture (has been in rehab but relatively immobile), dementia, hypothyroidism, arthritis, HTN, GERD, remote anal cancer 2014 who presented to the ER with SOB and worsening hypoxia.  She is accompanied by her daughter who also provides further collateral information.  Her daughter states that she has been on oxygen while in rehab due to her also being on Dilaudid but that she has not been O2 dependent prior to discharging to rehab for treatment of her fracture.  She underwent CTA chest in the ER and was found to have bilateral pulmonary emboli involving the right middle and lobar arteries as well as the right and left upper lobes (segmental branches). She underwent echo on admission as well which revealed EF 30 to 35%, no RWMA, grade 2 diastolic dysfunction, moderately elevated PA pressure.  RV size normal, normal RV systolic function.  No obvious heart strain. Hemodynamically she remained stable, ER vitals included temp 99, heart rate 97, respirations 16, BP 122/62, 95% on 2 L oxygen.  She was started on a heparin drip and pulmonology was also consulted given her submassive PE.  Recommendations were to continue on heparin drip.  She was not considered a candidate for thrombolytic therapy due to stable vitals as well as higher risk in setting of bleeding complications from her underlying fracture.   Interval History:  Admitted with shortness of breath.  Tolerating heparin drip for acute PE.  No significant events overnight and denies any chest pain this morning.   Old records reviewed in assessment of this patient  ROS: Constitutional: negative for chills and fevers, Respiratory: positive for Shortness of breath, Cardiovascular: negative for chest pain and Gastrointestinal: negative  for abdominal pain  Assessment & Plan: Hypertension -Hold meds for now in setting of submassive PE -will treat if becomes elevated  Hypothyroidism -Continue Synthroid  Acetabular fracture ([place]) - discharged to rehab on 07/12/20 - evaluated by ortho previous hospitalization: "Per orthopedics, patient will be touchdown weightbearing on the right leg for 8 weeks and then she will need to be bed to chair for 6 to 8 weeks" -will need ongoing anticoagulation now in setting of acute PE - continue PT  Dementia without behavioral disturbance ([place]) - mentation stable; she is alert and understands why she is in the hospital   Anemia -Baseline hemoglobin around 9 g/dL, currently remains at baseline -Monitor hemoglobin while on anticoagulation  Pulmonary embolus ([place]) -Bilateral PE in setting of significant immobility from recent right acetabular fracture.  Discharged from the hospital on 07/12/2020 to rehab.  She was already on oxygen, 2 L at rehab which her daughter states that was initiated in the setting of her being on Dilaudid for pain control but she has not had an oxygen requirement prior to rehab/previous hospitalization -Currently on 4 L oxygen, will wean as able -Continue on heparin drip and will transition to oral anticoagulation in the next 24 to 48 hours or as per pulmonology recommends; now okay to transition to [facility] per pulmonology, appreciate assistance -lower extremity duplex: age indeterminate deep vein thrombosis involving a single right peroneal vein. -Echo obtained, no obvious right heart strain.  EF is reduced, 30 to 35%, Gr II DD (last echo May 2018, EF 55-60%, Gr 1 DD).  - troponin significantly  elevated likely due to clot burden; EKG reviewed from admission (NSR, no obvious ischemia); continue trending troponin; per pulmonology, unlikely for further decompensation.  Will transfer patient out of SDU  Acute respiratory failure with hypoxia ([place]) - currently on 4L in ER - now  etiology is considered due to acute PE and clot burden; previously was on O2 at rehab per daughter because patient "was on dilaudid also" - wean as able but given PE, may be long term wean  DVT (deep venous thrombosis) ([facility]) - LE duplex: age indeterminate deep vein thrombosis involving a single right peroneal vein. - continue anticoagulation (see PE)   Antimicrobials: None  DVT prophylaxis: Heparin drip>>transition to Eliquis Code Status: DNR Family Communication: Daughter bedside in ER Disposition Plan:  Status is: Inpatient  Remains inpatient appropriate because:Unsafe d/c plan, IV treatments appropriate due to intensity of illness or inability to take PO and Inpatient level of care appropriate due to severity of illness   Dispo: The patient is from: SNF              Anticipated d/c is to: SNF              Anticipated d/c date is: 2 days              Patient currently is not medically stable to d/c.  Objective: Blood pressure (!) 152/56, pulse 71, temperature 97.8 F (36.6 C), temperature source Oral, resp. rate 18, height 5\' 6"  (1.676 m), weight 70.3 kg, SpO2 90 %.  Examination: General appearance: Pleasant elderly woman resting in bed in no distress  Head: Normocephalic, without obvious abnormality, atraumatic Eyes: EOMI Lungs: mostly clear anterior breath sounds Heart: regular rate and rhythm and S1, S2 normal Abdomen: normal findings: bowel sounds normal and soft, non-tender Extremities: no edema, no leg/calf swelling Skin: mobility and turgor normal Neurologic: no focal deficits but RLE mobility/strength limited by pain  Consultants:   Pulm  Procedures:   n/a  Data Reviewed: I have personally reviewed following labs and imaging studies Results for orders placed or performed during the hospital encounter of 07/28/20 (from the past 24 hour(s))  Lactic acid, plasma     Status: None   Collection Time: 07/28/20  2:53 PM  Result Value Ref Range   Lactic Acid,  Venous 1.1 0.5 - 1.9 mmol/L  Heparin level (unfractionated)     Status: None   Collection Time: 07/28/20  2:53 PM  Result Value Ref Range   Heparin Unfractionated 0.44 0.30 - 0.70 IU/mL  Troponin I (High Sensitivity)     Status: Abnormal   Collection Time: 07/28/20  2:53 PM  Result Value Ref Range   Troponin I (High Sensitivity) 4,291 (HH) <18 ng/L  Brain natriuretic peptide     Status: Abnormal   Collection Time: 07/28/20  2:53 PM  Result Value Ref Range   B Natriuretic Peptide 969.7 (H) 0.0 - 100.0 pg/mL  Heparin level (unfractionated)     Status: Abnormal   Collection Time: 07/28/20 10:48 PM  Result Value Ref Range   Heparin Unfractionated 0.29 (L) 0.30 - 0.70 IU/mL  MRSA PCR Screening     Status: None   Collection Time: 07/28/20 10:59 PM   Specimen: Nasal Mucosa; Nasopharyngeal  Result Value Ref Range   MRSA by PCR NEGATIVE NEGATIVE  CBC     Status: Abnormal   Collection Time: 07/29/20 12:58 AM  Result Value Ref Range   WBC 5.9 4.0 - 10.5 K/uL   RBC 2.53 (L)  3.87 - 5.11 MIL/uL   Hemoglobin 8.1 (L) 12.0 - 15.0 g/dL   HCT 25.6 (L) 36 - 46 %   MCV 101.2 (H) 80.0 - 100.0 fL   MCH 32.0 26.0 - 34.0 pg   MCHC 31.6 30.0 - 36.0 g/dL   RDW 12.3 11.5 - 15.5 %   Platelets 373 150 - 400 K/uL   nRBC 0.0 0.0 - 0.2 %  Basic metabolic panel     Status: Abnormal   Collection Time: 07/29/20 12:58 AM  Result Value Ref Range   Sodium 134 (L) 135 - 145 mmol/L   Potassium 3.7 3.5 - 5.1 mmol/L   Chloride 98 98 - 111 mmol/L   CO2 26 22 - 32 mmol/L   Glucose, Bld 98 70 - 99 mg/dL   BUN 16 8 - 23 mg/dL   Creatinine, Ser 0.85 0.44 - 1.00 mg/dL   Calcium 8.6 (L) 8.9 - 10.3 mg/dL   GFR calc non Af Amer >60 >60 mL/min   GFR calc Af Amer >60 >60 mL/min   Anion gap 10 5 - 15  Magnesium     Status: None   Collection Time: 07/29/20 12:58 AM  Result Value Ref Range   Magnesium 2.1 1.7 - 2.4 mg/dL  Troponin I (High Sensitivity)     Status: Abnormal   Collection Time: 07/29/20 12:58 AM  Result  Value Ref Range   Troponin I (High Sensitivity) 2,315 (HH) <18 ng/L  Heparin level (unfractionated)     Status: None   Collection Time: 07/29/20  6:19 AM  Result Value Ref Range   Heparin Unfractionated 0.53 0.30 - 0.70 IU/mL  Troponin I (High Sensitivity)     Status: Abnormal   Collection Time: 07/29/20  6:19 AM  Result Value Ref Range   Troponin I (High Sensitivity) 1,569 (HH) <18 ng/L    Recent Results (from the past 240 hour(s))  SARS Coronavirus 2 by RT PCR (hospital order, performed in [facility] hospital lab) Nasopharyngeal Nasopharyngeal Swab     Status: None   Collection Time: 07/28/20  2:20 AM   Specimen: Nasopharyngeal Swab  Result Value Ref Range Status   SARS Coronavirus 2 NEGATIVE NEGATIVE Final    Comment: (NOTE) SARS-CoV-2 target nucleic acids are NOT DETECTED.  The SARS-CoV-2 RNA is generally detectable in upper and lower respiratory specimens during the acute phase of infection. The lowest concentration of SARS-CoV-2 viral copies this assay can detect is 250 copies / mL. A negative result does not preclude SARS-CoV-2 infection and should not be used as the sole basis for treatment or other patient management decisions.  A negative result may occur with improper specimen collection / handling, submission of specimen other than nasopharyngeal swab, presence of viral mutation(s) within the areas targeted by this assay, and inadequate number of viral copies (<250 copies / mL). A negative result must be combined with clinical observations, patient history, and epidemiological information.  Fact Sheet for Patients:   StrictlyIdeas.no  Fact Sheet for Healthcare Providers: BankingDealers.co.za  This test is not yet approved or  cleared by the [place] FDA and has been authorized for detection and/or diagnosis of SARS-CoV-2 by FDA under an Emergency Use Authorization (EUA).  This EUA will remain in effect (meaning  this test can be used) for the duration of the COVID-19 declaration under Section 564(b)(1) of the Act, 21 U.S.C. section 360bbb-3(b)(1), unless the authorization is terminated or revoked sooner.  Performed at [facility], [address]  [place], [place] [address]   MRSA PCR Screening     Status: None   Collection Time: 07/28/20 10:59 PM   Specimen: Nasal Mucosa; Nasopharyngeal  Result Value Ref Range Status   MRSA by PCR NEGATIVE NEGATIVE Final    Comment:        The GeneXpert MRSA Assay (FDA approved for NASAL specimens only), is one component of a comprehensive MRSA colonization surveillance program. It is not intended to diagnose MRSA infection nor to guide or monitor treatment for MRSA infections. Performed at [facility], [place] [address], [place], [place] [address]      Radiology Studies: CT Angio Chest PE W/Cm &/Or Wo Cm  Result Date: 07/28/2020 CLINICAL DATA:  Hypoxia.  PE suspected, high prob EXAM: CT ANGIOGRAPHY CHEST WITH CONTRAST TECHNIQUE: Multidetector CT imaging of the chest was performed using the standard protocol during bolus administration of intravenous contrast. Multiplanar CT image reconstructions and MIPs were obtained to evaluate the vascular anatomy. CONTRAST:  61mL OMNIPAQUE IOHEXOL 350 MG/ML SOLN COMPARISON:  None. FINDINGS: Cardiovascular: Contrast injection is sufficient to demonstrate satisfactory opacification of the pulmonary arteries to the segmental level. There is a large burden of pulmonary emboli within the right lung, predominantly within the right middle and lower lobar arteries. There are small pulmonary emboli in the right and left upper lobe segmental branches. There is no evidence of right heart strain with RV/LV ratio of 0.75. the size of the main pulmonary artery is normal. Mild cardiomegaly. The course and caliber of the aorta are normal. There is mild atherosclerotic calcification. Opacification  decreased due to pulmonary arterial phase contrast bolus timing. Mediastinum/Nodes: No mediastinal, hilar or axillary lymphadenopathy. Normal visualized thyroid. Thoracic esophageal course is normal. Lungs/Pleura: Small pleural effusions, right greater than left. Bibasilar atelectasis. Upper Abdomen: Contrast bolus timing is not optimized for evaluation of the abdominal organs. The visualized portions of the organs of the upper abdomen are normal. Musculoskeletal: No chest wall abnormality. No bony spinal canal stenosis. Review of the MIP images confirms the above findings. IMPRESSION: 1. Large burden of pulmonary emboli within the right lung, predominantly within the right middle and lower lobar arteries. Small pulmonary emboli in the right and left upper lobe segmental branches. No evidence of right heart strain with RV/LV ratio of 0.75. 2. Small pleural effusions, right greater than left. 3. Aortic Atherosclerosis (ICD10-I70.0). Critical Value/emergent results were called by telephone at the time of interpretation on 07/28/2020 at 6:00 am to provider IVA KNAPP , who verbally acknowledged these results. Electronically Signed   By: Ulyses Jarred M.D.   On: 07/28/2020 06:03   DG Chest Port 1 View  Result Date: 07/28/2020 CLINICAL DATA:  Shortness of breath and chest pain EXAM: PORTABLE CHEST 1 VIEW COMPARISON:  07/07/2020 FINDINGS: Cardiac enlargement. Mild interstitial pattern to the lungs with peribronchial thickening. This may represent bronchiolitis or airways disease. No focal consolidation. No pleural effusions. No pneumothorax. Mediastinal contours appear intact. IMPRESSION: Cardiac enlargement. Interstitial pattern to the lungs may represent bronchiolitis or airways disease. Electronically Signed   By: Lucienne Capers M.D.   On: 07/28/2020 02:34   ECHOCARDIOGRAM COMPLETE  Result Date: 07/28/2020    ECHOCARDIOGRAM REPORT   Patient Name:   ANDIE MUNGIN Date of Exam: 07/28/2020 Medical Rec #:   [id number]          Height:       66.0 in Accession #:    [id number]         Weight:  160.0 lb Date of Birth:  Feb 26, 1930          BSA:          1.819 m Patient Age:    49 years           BP:           119/66 mmHg Patient Gender: F                  HR:           78 bpm. Exam Location:  Inpatient Procedure: 2D Echo and Intracardiac Opacification Agent Indications:    Pulmonary Embolus I26.99  History:        Patient has prior history of Echocardiogram examinations, most                 recent 04/28/2017. Risk Factors:Hypertension and Dyslipidemia.  Sonographer:    Mikki Santee RDCS (AE) Referring Phys: [place]  1. Left ventricular ejection fraction, by estimation, is 30-35%. The left ventricle has mildly decreased function. The left ventricle has no regional wall motion abnormalities. Left ventricular diastolic parameters are consistent with Grade II diastolic  dysfunction (pseudonormalization). Elevated left ventricular end-diastolic pressure. There is possible akinesis of the left ventricular, apical septal wall, apical segment, anterior wall and inferolateral wall. There is akinesis of the left ventricular,  mid anteroseptal wall.  2. Right ventricular systolic function is normal. The right ventricular size is normal. There is moderately elevated pulmonary artery systolic pressure. The estimated right ventricular systolic pressure is 19.4 mmHg.  3. The mitral valve is normal in structure. Mild mitral valve regurgitation. No evidence of mitral stenosis.  4. The aortic valve is normal in structure. Aortic valve regurgitation is not visualized. Mild to moderate aortic valve sclerosis/calcification is present, without any evidence of aortic stenosis.  5. The inferior vena cava is normal in size with greater than 50% respiratory variability, suggesting right atrial pressure of 3 mmHg.  6. Definity contrast study has been ordered to confirm wall motion abnormalities. FINDINGS  Left  Ventricle: Left ventricular ejection fraction, by estimation, is 30 to 35%. The left ventricle has moderately decreased function. The left ventricle has no regional wall motion abnormalities. Definity contrast agent was given IV to delineate the left ventricular endocardial borders. The left ventricular internal cavity size was normal in size. There is no left ventricular hypertrophy. Left ventricular diastolic parameters are consistent with Grade II diastolic dysfunction (pseudonormalization). Elevated left ventricular end-diastolic pressure. Right Ventricle: The right ventricular size is normal. No increase in right ventricular wall thickness. Right ventricular systolic function is normal. There is moderately elevated pulmonary artery systolic pressure. The tricuspid regurgitant velocity is 3.11 m/s, and with an assumed right atrial pressure of 3 mmHg, the estimated right ventricular systolic pressure is 17.4 mmHg. Left Atrium: Left atrial size was normal in size. Right Atrium: Right atrial size was normal in size. Pericardium: There is no evidence of pericardial effusion. Mitral Valve: The mitral valve is normal in structure. Normal mobility of the mitral valve leaflets. Mild mitral valve regurgitation. No evidence of mitral valve stenosis. Tricuspid Valve: The tricuspid valve is normal in structure. Tricuspid valve regurgitation is mild . No evidence of tricuspid stenosis. Aortic Valve: The aortic valve is normal in structure. Aortic valve regurgitation is not visualized. Mild to moderate aortic valve sclerosis/calcification is present, without any evidence of aortic stenosis. Pulmonic Valve: The pulmonic valve was normal in structure. Pulmonic valve regurgitation is not visualized. No  evidence of pulmonic stenosis. Aorta: The aortic root is normal in size and structure. Venous: The inferior vena cava is normal in size with greater than 50% respiratory variability, suggesting right atrial pressure of 3 mmHg.  IAS/Shunts: No atrial level shunt detected by color flow Doppler.  LEFT VENTRICLE PLAX 2D LVIDd:         4.40 cm  Diastology LVIDs:         2.60 cm  LV e' lateral:   7.51 cm/s LV PW:         1.10 cm  LV E/e' lateral: 14.0 LV IVS:        1.00 cm  LV e' medial:    5.45 cm/s LVOT diam:     2.10 cm  LV E/e' medial:  19.3 LV SV:         62 LV SV Index:   34 LVOT Area:     3.46 cm  RIGHT VENTRICLE RV S prime:     11.80 cm/s TAPSE (M-mode): 1.6 cm LEFT ATRIUM             Index       RIGHT ATRIUM           Index LA diam:        3.70 cm 2.03 cm/m  RA Area:     14.40 cm LA Vol (A2C):   64.2 ml 35.30 ml/m RA Volume:   32.90 ml  18.09 ml/m LA Vol (A4C):   47.5 ml 26.12 ml/m LA Biplane Vol: 57.4 ml 31.56 ml/m  AORTIC VALVE LVOT Vmax:   81.10 cm/s LVOT Vmean:  54.800 cm/s LVOT VTI:    0.180 m  AORTA Ao Root diam: 2.60 cm Ao Asc diam:  3.20 cm MITRAL VALVE                TRICUSPID VALVE MV Area (PHT): 3.77 cm     TR Peak grad:   38.7 mmHg MV Decel Time: 201 msec     TR Vmax:        311.00 cm/s MR Peak grad: 92.2 mmHg MR Mean grad: 68.0 mmHg     SHUNTS MR Vmax:      480.00 cm/s   Systemic VTI:  0.18 m MR Vmean:     401.0 cm/s    Systemic Diam: 2.10 cm MV E velocity: 105.00 cm/s MV A velocity: 74.70 cm/s MV E/A ratio:  1.41 Fransico Him MD Electronically signed by Fransico Him MD Signature Date/Time: 07/28/2020/2:41:55 PM    Final    VAS [place] LOWER EXTREMITY VENOUS (DVT)  Result Date: 07/28/2020  Lower Venous DVTStudy Indications: Pulmonary embolism.  Anticoagulation: Heparin. Limitations: Patient unable to cooperate/position for portions of exam. Comparison Study: No prior studies. Performing Technologist: Darlin Coco  Examination Guidelines: A complete evaluation includes B-mode imaging, spectral Doppler, color Doppler, and power Doppler as needed of all accessible portions of each vessel. Bilateral testing is considered an integral part of a complete examination. Limited examinations for reoccurring indications may be  performed as noted. The reflux portion of the exam is performed with the patient in reverse Trendelenburg.  +---------+---------------+---------+-----------+----------+-----------------+ RIGHT    CompressibilityPhasicitySpontaneityPropertiesThrombus Aging    +---------+---------------+---------+-----------+----------+-----------------+ CFV      Full           Yes      Yes                                    +---------+---------------+---------+-----------+----------+-----------------+  SFJ      Full                                                           +---------+---------------+---------+-----------+----------+-----------------+ FV Prox  Full                                                           +---------+---------------+---------+-----------+----------+-----------------+ FV Mid   Full                                                           +---------+---------------+---------+-----------+----------+-----------------+ FV DistalFull                                                           +---------+---------------+---------+-----------+----------+-----------------+ PFV      Full                                                           +---------+---------------+---------+-----------+----------+-----------------+ POP      Full           Yes      Yes                                    +---------+---------------+---------+-----------+----------+-----------------+ PTV      Full                                                           +---------+---------------+---------+-----------+----------+-----------------+ PERO     Partial                                      Age Indeterminate +---------+---------------+---------+-----------+----------+-----------------+   +---------+---------------+---------+-----------+----------+--------------+ LEFT     CompressibilityPhasicitySpontaneityPropertiesThrombus Aging  +---------+---------------+---------+-----------+----------+--------------+ CFV      Full           Yes      Yes                                 +---------+---------------+---------+-----------+----------+--------------+ SFJ      Full                                                        +---------+---------------+---------+-----------+----------+--------------+  FV Prox  Full                                                        +---------+---------------+---------+-----------+----------+--------------+ FV Mid   Full                                                        +---------+---------------+---------+-----------+----------+--------------+ FV DistalFull                                                        +---------+---------------+---------+-----------+----------+--------------+ PFV      Full                                                        +---------+---------------+---------+-----------+----------+--------------+ POP      Full           Yes      Yes                                 +---------+---------------+---------+-----------+----------+--------------+ PTV      Full                                                        +---------+---------------+---------+-----------+----------+--------------+ PERO                                                  Not visualized +---------+---------------+---------+-----------+----------+--------------+     Summary: RIGHT: - Findings consistent with partial, age indeterminate deep vein thrombosis involving a single right peroneal vein. - No cystic structure found in the popliteal fossa.  LEFT: - There is no evidence of deep vein thrombosis in the lower extremity. However, portions of this examination were limited- see technologist comments above.  - No cystic structure found in the popliteal fossa.  *See table(s) above for measurements and observations. Electronically signed by Harold Barban MD on  07/28/2020 at 5:50:15 PM.    Final    VAS [place] LOWER EXTREMITY VENOUS (DVT)  Final Result    CT Angio Chest PE W/Cm &/Or Wo Cm  Final Result    DG Chest [facility] 1 View  Final Result       Scheduled Meds: . ALPRAZolam  0.25 mg Oral QHS  . apixaban  10 mg Oral BID   Followed by  . [START ON 08/05/2020] apixaban  5 mg Oral BID  . buPROPion  200 mg Oral Daily  . Chlorhexidine Gluconate Cloth  6 each Topical Daily  .  docusate sodium  100 mg Oral BID  . escitalopram  10 mg Oral Daily  . levothyroxine  88 mcg Oral QAC breakfast  . lip balm      . methocarbamol  500 mg Oral TID  . pantoprazole  40 mg Oral Daily  . polyethylene glycol  17 g Oral Daily   PRN Meds: HYDROmorphone, lip balm, ondansetron **OR** ondansetron (ZOFRAN) IV Continuous Infusions:     LOS: 1 day  Time spent: Greater than 50% of the 35 minute visit was spent in counseling/coordination of care for the patient as laid out in the A&P.   Dwyane Dee, MD [facility] 07/29/2020, 12:18 PM   Contact via secure chat.  To contact the attending provider between 7A-7P or the covering provider during after hours 7P-7A, please log into the web site [email or web address] and access using universal  password for that web site. If you do not have the password, please call the hospital operator.

## 2020-07-29 NOTE — Progress Notes (Signed)
Cindy [name], MRN:  [id number], DOB:  1930/08/14, LOS: 1 ADMISSION DATE:  07/28/2020, CONSULTATION DATE:  07/28/20 REFERRING MD:  Hal Hope, CHIEF COMPLAINT:  Submassive PE   Brief History   Submassive PE, recent unrepaired R hip fracture, immobility.  History of present illness   [name] is an 86 y/o woman with a history of recent right acetabular fracture post mechanical fall, dementia, hypertension, GERD, anal cancer in 2014 who presents from rehab with acute shortness of breath that began yesterday.  History is mostly provided by her daughter Cindy [name] who is at bedside.  Her mother has dementia and had a fall [place] the [place] her [place] about a month ago.  She was not deemed a good candidate for operative repair.  She has had continued right leg pain.  Yesterday at rehab was her first day getting out of bed for mobility.  She began having shortness of breath overnight.  The patient currently denies shortness of breath, chest pain, lightheadedness.  She has no previous history of VTE, lung disease, cardiac disease.  On CTA she was found to have a PE, and heparin was started.  Pulmonology was consulted for evaluation of submassive PE.  Past Medical History  Dementia Right hip fracture Hypertension Anal cancer in 2014 GERD  Significant Hospital Events     Consults:  PCCM  Procedures:    Significant Diagnostic Tests:  CTA chest- 07/29/2019 one 2D echo with which reveals EF 35 to 02% grade 2 diastolic dysfunction with mildly elevated right ventricular pressures of 41.2 Micro Data:  covid negative  Antimicrobials:    Interim history/subjective:  Hemo-dynamically stable no acute distress at rest  Objective   Blood pressure (!) 152/56, pulse 63, temperature 97.8 F (36.6 C), temperature source Oral, resp. rate 20, height 5\' 6"  (1.676 m), weight 70.3 kg, SpO2 99 %.        Intake/Output Summary (Last 24 hours) at 07/29/2020 0913 Last data filed at 07/29/2020  0800 Gross per 24 hour  Intake 266.09 ml  Output --  Net 266.09 ml   Filed Weights   07/28/20 0144 07/28/20 1434 07/28/20 2300  Weight: 72.6 kg 61.2 kg 70.3 kg    Examination: General: Well-nourished well-developed female no acute distress HEENT: No JVD or lymphadenopathy is appreciated Neuro: Grossly intact without apparent focal defects CV: Heart sounds are regular PULM: Decreased breath sounds throughout Abdomen soft nontender positive bowel sounds Extremities: warm/dry, negative edema  Skin: no rashes or lesions   Resolved Hospital Problem list     Assessment & Plan:  Submassive provoked PE due to recent immobility from hip fracture.  Continue heparin Careful with angiolytics At this time thrombolytics have no definite improvement but carry inherent risks therefore would not use thrombolytics at this time. Echocardiogram reveals EF of 30 to 35% with a grade 2 diastolic dysfunction.  Right ventricular systolic function is normal.  There is a moderately elevated pulmonary artery systolic pressure estimated to be 41.7. Serial troponins peak troponin was 5263 on 07/28/2020 currently at 0600 hrs. on 07/29/2020 1569 Monitor hemodynamics She can leave the intensive care unit be monitored on the floor as she is hemodynamically stable  .    Best practice:  Per primary  Labs   CBC: Recent Labs  Lab 07/28/20 0235 07/29/20 0058  WBC 10.1 5.9  NEUTROABS 8.6*  --   HGB 9.1* 8.1*  HCT 27.7* 25.6*  MCV 98.2 101.2*  PLT 405* 542    Basic Metabolic  Panel: Recent Labs  Lab 07/28/20 0235 07/29/20 0058  NA 131* 134*  K 4.0 3.7  CL 96* 98  CO2 25 26  GLUCOSE 158* 98  BUN 17 16  CREATININE 1.09* 0.85  CALCIUM 8.5* 8.6*  MG  --  2.1   GFR: Estimated Creatinine Clearance: 42.8 mL/min (by C-G formula based on SCr of 0.85 mg/dL). Recent Labs  Lab 07/28/20 0235 07/28/20 0648 07/28/20 1453 07/29/20 0058  WBC 10.1  --   --  5.9  LATICACIDVEN  --  1.4 1.1  --      Liver Function Tests: Recent Labs  Lab 07/28/20 0235  AST 40  ALT 37  ALKPHOS 85  BILITOT 0.7  PROT 7.1  ALBUMIN 3.0*   No results for input(s): LIPASE, AMYLASE in the last 168 hours. No results for input(s): AMMONIA in the last 168 hours.  ABG    Component Value Date/Time   PHART 7.475 (H) 10/18/2012 0914   PCO2ART 29.3 (L) 10/18/2012 0914   PO2ART 101.0 (H) 10/18/2012 0914   HCO3 21.3 10/18/2012 0914   TCO2 18.6 10/18/2012 0914   ACIDBASEDEF 0.8 10/18/2012 0914   O2SAT 98.0 10/18/2012 0914     Coagulation Profile: Recent Labs  Lab 07/28/20 0636  INR 1.2    Cardiac Enzymes: No results for input(s): CKTOTAL, CKMB, CKMBINDEX, TROPONINI in the last 168 hours.  HbA1C: No results found for: HGBA1C  CBG: No results for input(s): GLUCAP in the last 168 hours.      Richardson Landry Meygan Kyser ACNP Acute Care Nurse Practitioner [place] Please consult Amion 07/29/2020, 9:16 AM

## 2020-07-29 NOTE — Progress Notes (Signed)
CRITICAL VALUE ALERT  Critical Value:  Troponin=2,315  Date & Time Notied:  07/29/20; 5:37am  Provider Notified: paged [email or web address]  Orders Received/Actions taken: waiting for orders

## 2020-07-30 DIAGNOSIS — Z923 Personal history of irradiation: Secondary | ICD-10-CM | POA: Diagnosis not present

## 2020-07-30 DIAGNOSIS — F411 Generalized anxiety disorder: Secondary | ICD-10-CM | POA: Diagnosis not present

## 2020-07-30 DIAGNOSIS — E039 Hypothyroidism, unspecified: Secondary | ICD-10-CM | POA: Diagnosis not present

## 2020-07-30 DIAGNOSIS — D649 Anemia, unspecified: Secondary | ICD-10-CM | POA: Diagnosis not present

## 2020-07-30 DIAGNOSIS — Z85828 Personal history of other malignant neoplasm of skin: Secondary | ICD-10-CM | POA: Diagnosis not present

## 2020-07-30 DIAGNOSIS — R404 Transient alteration of awareness: Secondary | ICD-10-CM | POA: Diagnosis not present

## 2020-07-30 DIAGNOSIS — M810 Age-related osteoporosis without current pathological fracture: Secondary | ICD-10-CM | POA: Diagnosis not present

## 2020-07-30 DIAGNOSIS — Z85048 Personal history of other malignant neoplasm of rectum, rectosigmoid junction, and anus: Secondary | ICD-10-CM | POA: Diagnosis not present

## 2020-07-30 DIAGNOSIS — E785 Hyperlipidemia, unspecified: Secondary | ICD-10-CM | POA: Diagnosis not present

## 2020-07-30 DIAGNOSIS — R4182 Altered mental status, unspecified: Secondary | ICD-10-CM | POA: Diagnosis not present

## 2020-07-30 DIAGNOSIS — F331 Major depressive disorder, recurrent, moderate: Secondary | ICD-10-CM | POA: Diagnosis not present

## 2020-07-30 DIAGNOSIS — Z9981 Dependence on supplemental oxygen: Secondary | ICD-10-CM | POA: Diagnosis not present

## 2020-07-30 DIAGNOSIS — I2699 Other pulmonary embolism without acute cor pulmonale: Secondary | ICD-10-CM | POA: Diagnosis not present

## 2020-07-30 DIAGNOSIS — R627 Adult failure to thrive: Secondary | ICD-10-CM | POA: Diagnosis not present

## 2020-07-30 DIAGNOSIS — I82401 Acute embolism and thrombosis of unspecified deep veins of right lower extremity: Secondary | ICD-10-CM | POA: Diagnosis not present

## 2020-07-30 DIAGNOSIS — K219 Gastro-esophageal reflux disease without esophagitis: Secondary | ICD-10-CM | POA: Diagnosis not present

## 2020-07-30 DIAGNOSIS — I82409 Acute embolism and thrombosis of unspecified deep veins of unspecified lower extremity: Secondary | ICD-10-CM | POA: Diagnosis not present

## 2020-07-30 DIAGNOSIS — S32409A Unspecified fracture of unspecified acetabulum, initial encounter for closed fracture: Secondary | ICD-10-CM | POA: Diagnosis not present

## 2020-07-30 DIAGNOSIS — Z85038 Personal history of other malignant neoplasm of large intestine: Secondary | ICD-10-CM | POA: Diagnosis not present

## 2020-07-30 DIAGNOSIS — R630 Anorexia: Secondary | ICD-10-CM | POA: Diagnosis not present

## 2020-07-30 DIAGNOSIS — J9601 Acute respiratory failure with hypoxia: Secondary | ICD-10-CM | POA: Diagnosis not present

## 2020-07-30 DIAGNOSIS — R278 Other lack of coordination: Secondary | ICD-10-CM | POA: Diagnosis not present

## 2020-07-30 DIAGNOSIS — I5043 Acute on chronic combined systolic (congestive) and diastolic (congestive) heart failure: Secondary | ICD-10-CM | POA: Diagnosis not present

## 2020-07-30 DIAGNOSIS — R531 Weakness: Secondary | ICD-10-CM | POA: Diagnosis not present

## 2020-07-30 DIAGNOSIS — S32401D Unspecified fracture of right acetabulum, subsequent encounter for fracture with routine healing: Secondary | ICD-10-CM | POA: Diagnosis not present

## 2020-07-30 DIAGNOSIS — M6281 Muscle weakness (generalized): Secondary | ICD-10-CM | POA: Diagnosis not present

## 2020-07-30 DIAGNOSIS — M1990 Unspecified osteoarthritis, unspecified site: Secondary | ICD-10-CM | POA: Diagnosis not present

## 2020-07-30 DIAGNOSIS — F29 Unspecified psychosis not due to a substance or known physiological condition: Secondary | ICD-10-CM | POA: Diagnosis not present

## 2020-07-30 DIAGNOSIS — I82451 Acute embolism and thrombosis of right peroneal vein: Secondary | ICD-10-CM | POA: Diagnosis not present

## 2020-07-30 DIAGNOSIS — I10 Essential (primary) hypertension: Secondary | ICD-10-CM | POA: Diagnosis not present

## 2020-07-30 DIAGNOSIS — R279 Unspecified lack of coordination: Secondary | ICD-10-CM | POA: Diagnosis not present

## 2020-07-30 DIAGNOSIS — Z743 Need for continuous supervision: Secondary | ICD-10-CM | POA: Diagnosis not present

## 2020-07-30 DIAGNOSIS — I5021 Acute systolic (congestive) heart failure: Secondary | ICD-10-CM | POA: Diagnosis not present

## 2020-07-30 DIAGNOSIS — I2609 Other pulmonary embolism with acute cor pulmonale: Secondary | ICD-10-CM | POA: Diagnosis not present

## 2020-07-30 DIAGNOSIS — R41841 Cognitive communication deficit: Secondary | ICD-10-CM | POA: Diagnosis not present

## 2020-07-30 DIAGNOSIS — F329 Major depressive disorder, single episode, unspecified: Secondary | ICD-10-CM | POA: Diagnosis not present

## 2020-07-30 DIAGNOSIS — R52 Pain, unspecified: Secondary | ICD-10-CM | POA: Diagnosis not present

## 2020-07-30 DIAGNOSIS — K5900 Constipation, unspecified: Secondary | ICD-10-CM | POA: Diagnosis not present

## 2020-07-30 DIAGNOSIS — F0390 Unspecified dementia without behavioral disturbance: Secondary | ICD-10-CM | POA: Diagnosis not present

## 2020-07-30 DIAGNOSIS — G2581 Restless legs syndrome: Secondary | ICD-10-CM | POA: Diagnosis not present

## 2020-07-30 DIAGNOSIS — F419 Anxiety disorder, unspecified: Secondary | ICD-10-CM | POA: Diagnosis not present

## 2020-07-30 DIAGNOSIS — I5040 Unspecified combined systolic (congestive) and diastolic (congestive) heart failure: Secondary | ICD-10-CM | POA: Diagnosis not present

## 2020-07-30 DIAGNOSIS — Z9181 History of falling: Secondary | ICD-10-CM | POA: Diagnosis not present

## 2020-07-30 LAB — CBC
HCT: 25.8 % — ABNORMAL LOW (ref 36.0–46.0)
Hemoglobin: 8.1 g/dL — ABNORMAL LOW (ref 12.0–15.0)
MCH: 31.5 pg (ref 26.0–34.0)
MCHC: 31.4 g/dL (ref 30.0–36.0)
MCV: 100.4 fL — ABNORMAL HIGH (ref 80.0–100.0)
Platelets: 342 10*3/uL (ref 150–400)
RBC: 2.57 MIL/uL — ABNORMAL LOW (ref 3.87–5.11)
RDW: 12.2 % (ref 11.5–15.5)
WBC: 6.7 10*3/uL (ref 4.0–10.5)
nRBC: 0 % (ref 0.0–0.2)

## 2020-07-30 LAB — BASIC METABOLIC PANEL
Anion gap: 10 (ref 5–15)
BUN: 18 mg/dL (ref 8–23)
CO2: 24 mmol/L (ref 22–32)
Calcium: 8.4 mg/dL — ABNORMAL LOW (ref 8.9–10.3)
Chloride: 93 mmol/L — ABNORMAL LOW (ref 98–111)
Creatinine, Ser: 1 mg/dL (ref 0.44–1.00)
GFR calc Af Amer: 58 mL/min — ABNORMAL LOW (ref >60)
GFR calc non Af Amer: 50 mL/min — ABNORMAL LOW (ref >60)
Glucose, Bld: 104 mg/dL — ABNORMAL HIGH (ref 70–99)
Potassium: 4.1 mmol/L (ref 3.5–5.1)
Sodium: 127 mmol/L — ABNORMAL LOW (ref 135–145)

## 2020-07-30 LAB — MAGNESIUM: Magnesium: 2.3 mg/dL (ref 1.7–2.4)

## 2020-07-30 MED ORDER — HYDROMORPHONE HCL 2 MG PO TABS: 2.0000 mg | ORAL_TABLET | ORAL | 0 refills | Status: DC | PRN

## 2020-07-30 MED ORDER — ALPRAZOLAM 0.25 MG PO TABS: 0.2500 mg | ORAL_TABLET | Freq: Every evening | ORAL | 0 refills | Status: DC | PRN

## 2020-07-30 MED ORDER — APIXABAN 5 MG PO TABS: 10.0000 mg | ORAL_TABLET | Freq: Two times a day (BID) | ORAL | Status: DC

## 2020-07-30 MED ORDER — APIXABAN 5 MG PO TABS: 5.0000 mg | ORAL_TABLET | Freq: Two times a day (BID) | ORAL | Status: DC

## 2020-07-30 NOTE — Discharge Summary (Signed)
Physician Discharge Summary  Cindy [name] KVQ:[id number] DOB: Nov 24, 1930 DOA: 07/28/2020  PCP: Josetta Huddle, MD  Admit date: 07/28/2020 Discharge date: 07/30/2020  Admitted From: SNF Disposition:  SNF Discharging physician: Dwyane Dee, MD  Recommendations for Outpatient Follow-up:  1. Taper dilaudid off 2. Increase activity as per ortho recs, see below 3. Wean O2 off as able  Patient discharged to SNF in Discharge Condition: stable CODE STATUS: DNR Diet recommendation:  Diet Orders (From admission, onward)    Start     Ordered   07/28/20 0637  Diet Heart Room service appropriate? Yes; Fluid consistency: Thin  Diet effective now       Question Answer Comment  Room service appropriate? Yes   Fluid consistency: Thin      07/28/20 6433          Hospital Course: [name] is an 86 yo CF with PMH recent right acetabular fracture (has been in rehab but relatively immobile), dementia, hypothyroidism, arthritis, HTN, GERD, remote anal cancer 2014 who presented to the ER with SOB and worsening hypoxia.  She is accompanied by her daughter who also provides further collateral information.  Her daughter states that she has been on oxygen while in rehab due to her also being on Dilaudid but that she has not been O2 dependent prior to discharging to rehab for treatment of her fracture.  She underwent CTA chest in the ER and was found to have bilateral pulmonary emboli involving the right middle and lobar arteries as well as the right and left upper lobes (segmental branches). She underwent echo on admission as well which revealed EF 30 to 35%, no RWMA, grade 2 diastolic dysfunction, moderately elevated PA pressure.  RV size normal, normal RV systolic function.  No obvious heart strain. LE duplex also showed age indeterminate deep vein thrombosis involving a single right peroneal vein.   She was started on a heparin drip and pulmonology was also consulted given her submassive PE.   Recommendations were to continue on heparin drip on admission.  She was not considered a candidate for thrombolytic therapy due to stable vitals as well as higher risk in setting of bleeding complications from her underlying fracture. She was transitioned to Eliquis on 07/29/20 (10mg  BID x 7 days then 5 mg BID for at least 3 months) then will need outpatient followup with pulmonology.   Fracture date: 07/07/20. Per ortho: "Per orthopedics, patient will be touchdown weightbearing on the right leg for 8 weeks and then she will need to be bed to chair for 6 to 8 weeks."   Hypertension -Hold meds for now in setting of submassive PE -will treat if becomes elevated  Hypothyroidism -Continue Synthroid  Acetabular fracture ([place]) - discharged to rehab on 07/12/20 - evaluated by ortho previous hospitalization: "Per orthopedics, patient will be touchdown weightbearing on the right leg for 8 weeks and then she will need to be bed to chair for 6 to 8 weeks" -will need ongoing anticoagulation now in setting of acute PE - continue PT  Dementia without behavioral disturbance ([place]) - mentation stable; she is alert and understands why she is in the hospital   Anemia -Baseline hemoglobin around 9 g/dL, currently remains at baseline -Monitor hemoglobin while on anticoagulation  Pulmonary embolus ([place]) -Bilateral PE in setting of significant immobility from recent right acetabular fracture.  Discharged from the hospital on 07/12/2020 to rehab.  She was already on oxygen, 2 L at rehab which her daughter states that was initiated in  the setting of her being on Dilaudid for pain control but she has not had an oxygen requirement prior to rehab/previous hospitalization -I have told her and family that she needs to taper off of Dilaudid sooner than later -Currently on 4 L oxygen, will wean as able -Initially on heparin drip which was transitioned to Eliquis on 07/29/2020  -lower extremity duplex: age indeterminate  deep vein thrombosis involving a single right peroneal vein. -Echo obtained, no obvious right heart strain.  EF is reduced, 30 to 35%, Gr II DD (last echo May 2018, EF 55-60%, Gr 1 DD).  - troponin significantly elevated likely due to clot burden; EKG reviewed from admission (NSR, no obvious ischemia); continue trending troponin; per pulmonology, unlikely for further decompensation.   Acute respiratory failure with hypoxia (H[facility]) - currently on 4L in ER - now etiology is considered due to acute PE and clot burden; previously was on O2 at rehab per daughter because patient "was on dilaudid also" - wean as able but given PE, may be long term wean  DVT (deep venous thrombosis) (H[facility]) - LE duplex: age indeterminate deep vein thrombosis involving a single right peroneal vein. - continue anticoagulation (see PE)    The patient's chronic medical conditions were treated accordingly per the patient's home medication regimen except as noted.  On day of discharge, patient was felt deemed stable for discharge. Patient/family member advised to call PCP or come back to ER if needed.   Discharge Diagnoses:   Principal Diagnosis: Pulmonary embolus [facility])  Active Hospital Problems   Diagnosis Date Noted  . Pulmonary embolus ([place]) 07/28/2020    Priority: High  . Acute respiratory failure with hypoxia ([place]) 07/28/2020    Priority: High  . DVT (deep venous thrombosis) ([place]) 07/29/2020    Priority: Medium  . Anemia 07/28/2020  . Pulmonary embolism ([place]) 07/28/2020  . Dementia without behavioral disturbance ([place])   . DNR (do not resuscitate)   . Acetabular fracture ([place]) 07/07/2020  . Hypertension 10/18/2012  . Hypothyroidism 10/17/2012    Resolved Hospital Problems  No resolved problems to display.    Discharge Instructions    Increase activity slowly   Complete by: As directed    Fracture date: 07/07/20. Per ortho: "Per orthopedics, patient will be touchdown weightbearing on the right leg for 8  weeks and then she will need to be bed to chair for 6 to 8 weeks."     Allergies as of 07/30/2020      Reactions   Vancomycin Hives   Zosyn and vanc given together; unclear which was precipitating med. Appears to have tolerated cephalosporins in the past.   Zosyn [piperacillin Sod-tazobactam So] Hives   Zosyn and vanc given together; unclear which was precipitating med. Appears to have tolerated cephalosporins in the past.   Hydrocodone Nausea Only   Morphine And Related Hives      Medication List    STOP taking these medications   enoxaparin 30 MG/0.3ML injection Commonly known as: Lovenox     TAKE these medications   acetaminophen 325 MG tablet Commonly known as: TYLENOL Take 650 mg by mouth every 6 (six) hours as needed for moderate pain.   ALPRAZolam 0.25 MG tablet Commonly known as: XANAX Take 1 tablet (0.25 mg total) by mouth at bedtime as needed for anxiety or sleep. What changed:   when to take this  reasons to take this   amLODipine 5 MG tablet Commonly known as: NORVASC Take 1 tablet (5 mg total)  by mouth daily.   apixaban 5 MG Tabs tablet Commonly known as: ELIQUIS Take 2 tablets (10 mg total) by mouth 2 (two) times daily for 5 days.   apixaban 5 MG Tabs tablet Commonly known as: ELIQUIS Take 1 tablet (5 mg total) by mouth 2 (two) times daily. Starting 8/15 after completes 10 mg BID ending 8/14 Start taking on: August 05, 2020   buPROPion 200 MG 12 hr tablet Commonly known as: WELLBUTRIN SR Take 200 mg by mouth daily.   docusate sodium 100 MG capsule Commonly known as: COLACE Take 100 mg by mouth 2 (two) times daily.   escitalopram 10 MG tablet Commonly known as: LEXAPRO Take 10 mg by mouth daily.   HYDROmorphone 2 MG tablet Commonly known as: DILAUDID Take 1 tablet (2 mg total) by mouth every 3 (three) hours as needed for severe pain.   levothyroxine 88 MCG tablet Commonly known as: SYNTHROID Take 88 mcg by mouth daily before breakfast.    methocarbamol 500 MG tablet Commonly known as: ROBAXIN Take 500 mg by mouth 3 (three) times daily.   ondansetron 4 MG disintegrating tablet Commonly known as: Zofran ODT Take 1 tablet (4 mg total) by mouth every 8 (eight) hours as needed for nausea or vomiting.   pantoprazole 40 MG tablet Commonly known as: PROTONIX Take 40 mg by mouth daily.   polyethylene glycol 17 g packet Commonly known as: MIRALAX / GLYCOLAX Take 17 g by mouth daily.   Premarin 0.3 MG tablet Generic drug: estrogens (conjugated) Take 0.3 mg by mouth daily.       Allergies  Allergen Reactions  . Vancomycin Hives    Zosyn and vanc given together; unclear which was precipitating med. Appears to have tolerated cephalosporins in the past.  . Zosyn [Piperacillin Sod-Tazobactam So] Hives    Zosyn and vanc given together; unclear which was precipitating med. Appears to have tolerated cephalosporins in the past.  . Hydrocodone Nausea Only  . Morphine And Related Hives   Consultations: Pulm  Discharge Exam: BP (!) 131/57 (BP Location: Left Arm)   Pulse 77   Temp 98.9 F (37.2 C) (Oral)   Resp 20   Ht 5\' 6"  (1.676 m)   Wt 70.3 kg   SpO2 95%   BMI 25.01 kg/m  General appearance: Pleasant elderly woman resting in bed in no distress  Head: Normocephalic, without obvious abnormality, atraumatic Eyes: EOMI Lungs: mostly clear anterior breath sounds Heart: regular rate and rhythm and S1, S2 normal Abdomen: normal findings: bowel sounds normal and soft, non-tender Extremities: no edema, no leg/calf swelling Skin: mobility and turgor normal Neurologic: no focal deficits but RLE mobility/strength limited by pain  The results of significant diagnostics from this hospitalization (including imaging, microbiology, ancillary and laboratory) are listed below for reference.   Microbiology: Recent Results (from the past 240 hour(s))  SARS Coronavirus 2 by RT PCR (hospital order, performed in [facility] hospital  lab) Nasopharyngeal Nasopharyngeal Swab     Status: None   Collection Time: 07/28/20  2:20 AM   Specimen: Nasopharyngeal Swab  Result Value Ref Range Status   SARS Coronavirus 2 NEGATIVE NEGATIVE Final    Comment: (NOTE) SARS-CoV-2 target nucleic acids are NOT DETECTED.  The SARS-CoV-2 RNA is generally detectable in upper and lower respiratory specimens during the acute phase of infection. The lowest concentration of SARS-CoV-2 viral copies this assay can detect is 250 copies / mL. A negative result does not preclude SARS-CoV-2 infection and should not be  used as the sole basis for treatment or other patient management decisions.  A negative result may occur with improper specimen collection / handling, submission of specimen other than nasopharyngeal swab, presence of viral mutation(s) within the areas targeted by this assay, and inadequate number of viral copies (<250 copies / mL). A negative result must be combined with clinical observations, patient history, and epidemiological information.  Fact Sheet for Patients:   StrictlyIdeas.no  Fact Sheet for Healthcare Providers: BankingDealers.co.za  This test is not yet approved or  cleared by the [place] FDA and has been authorized for detection and/or diagnosis of SARS-CoV-2 by FDA under an Emergency Use Authorization (EUA).  This EUA will remain in effect (meaning this test can be used) for the duration of the COVID-19 declaration under Section 564(b)(1) of the Act, 21 U.S.C. section 360bbb-3(b)(1), unless the authorization is terminated or revoked sooner.  Performed at [facility], [place] [address], [place], [place] [address]   MRSA PCR Screening     Status: None   Collection Time: 07/28/20 10:59 PM   Specimen: Nasal Mucosa; Nasopharyngeal  Result Value Ref Range Status   MRSA by PCR NEGATIVE NEGATIVE Final    Comment:        The GeneXpert MRSA Assay  (FDA approved for NASAL specimens only), is one component of a comprehensive MRSA colonization surveillance program. It is not intended to diagnose MRSA infection nor to guide or monitor treatment for MRSA infections. Performed at [facility], [place] [address], [place], [place] [address]      Labs: BNP (last 3 results) Recent Labs    07/28/20 0235 07/28/20 0648 07/28/20 1453  BNP 275.6* 597.5* 371.0*   Basic Metabolic Panel: Recent Labs  Lab 07/28/20 0235 07/29/20 0058 07/30/20 0501  NA 131* 134* 127*  K 4.0 3.7 4.1  CL 96* 98 93*  CO2 25 26 24   GLUCOSE 158* 98 104*  BUN 17 16 18   CREATININE 1.09* 0.85 1.00  CALCIUM 8.5* 8.6* 8.4*  MG  --  2.1 2.3   Liver Function Tests: Recent Labs  Lab 07/28/20 0235  AST 40  ALT 37  ALKPHOS 85  BILITOT 0.7  PROT 7.1  ALBUMIN 3.0*   No results for input(s): LIPASE, AMYLASE in the last 168 hours. No results for input(s): AMMONIA in the last 168 hours. CBC: Recent Labs  Lab 07/28/20 0235 07/29/20 0058 07/30/20 0501  WBC 10.1 5.9 6.7  NEUTROABS 8.6*  --   --   HGB 9.1* 8.1* 8.1*  HCT 27.7* 25.6* 25.8*  MCV 98.2 101.2* 100.4*  PLT 405* 373 342   Cardiac Enzymes: No results for input(s): CKTOTAL, CKMB, CKMBINDEX, TROPONINI in the last 168 hours. BNP: Invalid input(s): POCBNP CBG: No results for input(s): GLUCAP in the last 168 hours. D-Dimer Recent Labs    07/28/20 0235  DDIMER 16.59*   Hgb A1c No results for input(s): HGBA1C in the last 72 hours. Lipid Profile No results for input(s): CHOL, HDL, LDLCALC, TRIG, CHOLHDL, LDLDIRECT in the last 72 hours. Thyroid function studies No results for input(s): TSH, T4TOTAL, T3FREE, THYROIDAB in the last 72 hours.  Invalid input(s): FREET3 Anemia work up No results for input(s): VITAMINB12, FOLATE, FERRITIN, TIBC, IRON, RETICCTPCT in the last 72 hours. Urinalysis    Component Value Date/Time   COLORURINE COLORLESS (A) 07/06/2017 1630    APPEARANCEUR CLEAR 07/06/2017 1630   LABSPEC 1.003 (L) 07/06/2017 1630   LABSPEC 1.020 12/02/2013 1329   PHURINE  7.0 07/06/2017 1630   GLUCOSEU NEGATIVE 07/06/2017 1630   GLUCOSEU Negative 12/02/2013 1329   HGBUR SMALL (A) 07/06/2017 1630   BILIRUBINUR NEGATIVE 07/06/2017 1630   BILIRUBINUR Negative 12/02/2013 1329   KETONESUR NEGATIVE 07/06/2017 1630   PROTEINUR NEGATIVE 07/06/2017 1630   UROBILINOGEN 0.2 12/24/2013 1938   UROBILINOGEN 0.2 12/02/2013 1329   NITRITE NEGATIVE 07/06/2017 1630   LEUKOCYTESUR NEGATIVE 07/06/2017 1630   LEUKOCYTESUR Trace 12/02/2013 1329   Sepsis Labs Invalid input(s): PROCALCITONIN,  WBC,  LACTICIDVEN Microbiology Recent Results (from the past 240 hour(s))  SARS Coronavirus 2 by RT PCR (hospital order, performed in [facility] hospital lab) Nasopharyngeal Nasopharyngeal Swab     Status: None   Collection Time: 07/28/20  2:20 AM   Specimen: Nasopharyngeal Swab  Result Value Ref Range Status   SARS Coronavirus 2 NEGATIVE NEGATIVE Final    Comment: (NOTE) SARS-CoV-2 target nucleic acids are NOT DETECTED.  The SARS-CoV-2 RNA is generally detectable in upper and lower respiratory specimens during the acute phase of infection. The lowest concentration of SARS-CoV-2 viral copies this assay can detect is 250 copies / mL. A negative result does not preclude SARS-CoV-2 infection and should not be used as the sole basis for treatment or other patient management decisions.  A negative result may occur with improper specimen collection / handling, submission of specimen other than nasopharyngeal swab, presence of viral mutation(s) within the areas targeted by this assay, and inadequate number of viral copies (<250 copies / mL). A negative result must be combined with clinical observations, patient history, and epidemiological information.  Fact Sheet for Patients:   StrictlyIdeas.no  Fact Sheet for Healthcare  Providers: BankingDealers.co.za  This test is not yet approved or  cleared by the [place] FDA and has been authorized for detection and/or diagnosis of SARS-CoV-2 by FDA under an Emergency Use Authorization (EUA).  This EUA will remain in effect (meaning this test can be used) for the duration of the COVID-19 declaration under Section 564(b)(1) of the Act, 21 U.S.C. section 360bbb-3(b)(1), unless the authorization is terminated or revoked sooner.  Performed at [facility], [place] [address], [place], [place] [address]   MRSA PCR Screening     Status: None   Collection Time: 07/28/20 10:59 PM   Specimen: Nasal Mucosa; Nasopharyngeal  Result Value Ref Range Status   MRSA by PCR NEGATIVE NEGATIVE Final    Comment:        The GeneXpert MRSA Assay (FDA approved for NASAL specimens only), is one component of a comprehensive MRSA colonization surveillance program. It is not intended to diagnose MRSA infection nor to guide or monitor treatment for MRSA infections. Performed at [facility], [place] [address], [place], [place] [address]     Procedures/Studies: DG Chest 1 View  Result Date: 07/07/2020 CLINICAL DATA:  Patient fell earlier today while outside at home. EXAM: CHEST  1 VIEW COMPARISON:  Chest radiograph 03/09/2017 FINDINGS: Stable cardiomediastinal contours. Low lung volumes. No focal consolidation. No pneumothorax or large pleural effusion. No acute finding in the visualized skeleton. IMPRESSION: No acute cardiopulmonary finding. Electronically Signed   By: Audie Pinto M.D.   On: 07/07/2020 18:11   CT Angio Chest PE W/Cm &/Or Wo Cm  Result Date: 07/28/2020 CLINICAL DATA:  Hypoxia.  PE suspected, high prob EXAM: CT ANGIOGRAPHY CHEST WITH CONTRAST TECHNIQUE: Multidetector CT imaging of the chest was performed using the standard protocol during bolus administration of intravenous contrast. Multiplanar CT image  reconstructions  and MIPs were obtained to evaluate the vascular anatomy. CONTRAST:  49mL OMNIPAQUE IOHEXOL 350 MG/ML SOLN COMPARISON:  None. FINDINGS: Cardiovascular: Contrast injection is sufficient to demonstrate satisfactory opacification of the pulmonary arteries to the segmental level. There is a large burden of pulmonary emboli within the right lung, predominantly within the right middle and lower lobar arteries. There are small pulmonary emboli in the right and left upper lobe segmental branches. There is no evidence of right heart strain with RV/LV ratio of 0.75. the size of the main pulmonary artery is normal. Mild cardiomegaly. The course and caliber of the aorta are normal. There is mild atherosclerotic calcification. Opacification decreased due to pulmonary arterial phase contrast bolus timing. Mediastinum/Nodes: No mediastinal, hilar or axillary lymphadenopathy. Normal visualized thyroid. Thoracic esophageal course is normal. Lungs/Pleura: Small pleural effusions, right greater than left. Bibasilar atelectasis. Upper Abdomen: Contrast bolus timing is not optimized for evaluation of the abdominal organs. The visualized portions of the organs of the upper abdomen are normal. Musculoskeletal: No chest wall abnormality. No bony spinal canal stenosis. Review of the MIP images confirms the above findings. IMPRESSION: 1. Large burden of pulmonary emboli within the right lung, predominantly within the right middle and lower lobar arteries. Small pulmonary emboli in the right and left upper lobe segmental branches. No evidence of right heart strain with RV/LV ratio of 0.75. 2. Small pleural effusions, right greater than left. 3. Aortic Atherosclerosis (ICD10-I70.0). Critical Value/emergent results were called by telephone at the time of interpretation on 07/28/2020 at 6:00 am to provider IVA KNAPP , who verbally acknowledged these results. Electronically Signed   By: Ulyses Jarred M.D.   On: 07/28/2020 06:03    CT PELVIS WO CONTRAST  Result Date: 07/07/2020 CLINICAL DATA:  Right pelvic/is tabular fracture. EXAM: CT PELVIS WITHOUT CONTRAST TECHNIQUE: Multidetector CT imaging of the pelvis was performed following the standard protocol without intravenous contrast. COMPARISON:  CT of the abdomen and pelvis June 13, 2003 FINDINGS: Urinary Tract: There is fat stranding surrounding the urinary bladder, but the urinary bladder itself is intact. Bowel:  Unremarkable visualized pelvic bowel loops. Vascular/Lymphatic: No pathologically enlarged lymph nodes. No significant vascular abnormality seen. Reproductive: No mass or other significant abnormality, post hysterectomy. Other: Fat stranding and small fluid collection within the right pelvis and along the right lower abdominal and pelvic wall. Musculoskeletal: The visualized portion of the right humerus is intact. There is a comminuted triplane displaced anterior and posterior right acetabular fracture. The right humeral head is normally located within the fractured acetabulum. Several fracture lines extend intra-articularly into the right hip joint. The remainder of the pelvic bones are intact. IMPRESSION: 1. Comminuted triplane displaced anterior and posterior right acetabular fracture. Several fracture lines extend intra-articularly into the right hip joint. 2. Fat stranding and small fluid collection within the right pelvis and along the right lower abdominal and pelvic wall. 3. Fat stranding surrounding the urinary bladder, but the urinary bladder itself is intact. Electronically Signed   By: Fidela Salisbury M.D.   On: 07/07/2020 19:39   US RENAL  Result Date: 07/11/2020 CLINICAL DATA:  Acute kidney injury hypertension EXAM: RENAL / URINARY TRACT ULTRASOUND COMPLETE COMPARISON:  CT 07/06/2017 FINDINGS: Right Kidney: Renal measurements: 10.1 by 4.7 x 6.2 cm = volume: 153.9 mL . Echogenicity within normal limits. No mass or hydronephrosis visualized. Left Kidney:  Renal measurements: 8.8 x 4.5 x 4.3 cm = volume: 89.1 mL. Limited visualization. Echogenicity within normal limits. No hydronephrosis or mass. Possible cortical  thinning at the poles. Bladder: Appears normal for degree of bladder distention. Other: Liver appears echogenic IMPRESSION: 1. Negative for hydronephrosis. 2. Slightly limited visibility of left kidney. Left kidney measures slightly smaller than the right and there may be mild cortical thinning at the poles. 3. Liver appears echogenic suggesting steatosis. This may be correlated with LFTs. Electronically Signed   By: Donavan Foil M.D.   On: 07/11/2020 18:56   DG Pelvis Comp Min 3V  Result Date: 07/12/2020 CLINICAL DATA:  Follow-up right acetabular fracture. EXAM: JUDET PELVIS - 3+ VIEW COMPARISON:  07/09/2020 FINDINGS: Right acetabular fracture is again identified. As seen on CT from 07/07/20 there are fracture deformities involving the anterior and posterior column of the right acetabulum with medial displacement of the medial acetabular wall. There is associated medial dislocation of the femoral head in relation to the native acetabulum. The appearance is unchanged when compared with 07/09/2020. No additional fractures identified. IMPRESSION: No change in appearance of right acetabular fracture and dislocation of the right hip. Electronically Signed   By: Kerby Moors M.D.   On: 07/12/2020 11:04   DG Pelvis Comp Min 3V  Result Date: 07/09/2020 CLINICAL DATA:  Right acetabular fracture EXAM: JUDET PELVIS - 3+ VIEW COMPARISON:  CT 07/07/20 FINDINGS: Right acetabular fractures identified with fracture of the anterior column, fracture of the posterior column, and medial displacement of the a medial acetabular wall with the anterior column fracture plane extending to involve the acetabular roof, resultant articular incongruity, and medial dislocation of the femoral head in relation to the native acetabulum. Known fracture of the anterior lip of the  right acetabulum is not well appreciated on this examination. No other fracture identified. IMPRESSION: Bicolumn right acetabular fracture with medial displacement of the medial acetabular wall and resultant medial dislocation of the femoral head. Electronically Signed   By: Fidela Salisbury MD   On: 07/09/2020 15:21   DG Chest Port 1 View  Result Date: 07/28/2020 CLINICAL DATA:  Shortness of breath and chest pain EXAM: PORTABLE CHEST 1 VIEW COMPARISON:  07/07/2020 FINDINGS: Cardiac enlargement. Mild interstitial pattern to the lungs with peribronchial thickening. This may represent bronchiolitis or airways disease. No focal consolidation. No pleural effusions. No pneumothorax. Mediastinal contours appear intact. IMPRESSION: Cardiac enlargement. Interstitial pattern to the lungs may represent bronchiolitis or airways disease. Electronically Signed   By: Lucienne Capers M.D.   On: 07/28/2020 02:34   ECHOCARDIOGRAM COMPLETE  Result Date: 07/28/2020    ECHOCARDIOGRAM REPORT   Patient Name:   Cindy [name] Date of Exam: 07/28/2020 Medical Rec #:  [id number]          Height:       66.0 in Accession #:    [id number]         Weight:       160.0 lb Date of Birth:  02-14-1930          BSA:          1.819 m Patient Age:    25 years           BP:           119/66 mmHg Patient Gender: F                  HR:           78 bpm. Exam Location:  Inpatient Procedure: 2D Echo and Intracardiac Opacification Agent Indications:    Pulmonary Embolus I26.99  History:  Patient has prior history of Echocardiogram examinations, most                 recent 04/28/2017. Risk Factors:Hypertension and Dyslipidemia.  Sonographer:    Mikki Santee RDCS (AE) Referring Phys: [place]  1. Left ventricular ejection fraction, by estimation, is 30-35%. The left ventricle has mildly decreased function. The left ventricle has no regional wall motion abnormalities. Left ventricular diastolic parameters are  consistent with Grade II diastolic  dysfunction (pseudonormalization). Elevated left ventricular end-diastolic pressure. There is possible akinesis of the left ventricular, apical septal wall, apical segment, anterior wall and inferolateral wall. There is akinesis of the left ventricular,  mid anteroseptal wall.  2. Right ventricular systolic function is normal. The right ventricular size is normal. There is moderately elevated pulmonary artery systolic pressure. The estimated right ventricular systolic pressure is 14.4 mmHg.  3. The mitral valve is normal in structure. Mild mitral valve regurgitation. No evidence of mitral stenosis.  4. The aortic valve is normal in structure. Aortic valve regurgitation is not visualized. Mild to moderate aortic valve sclerosis/calcification is present, without any evidence of aortic stenosis.  5. The inferior vena cava is normal in size with greater than 50% respiratory variability, suggesting right atrial pressure of 3 mmHg.  6. Definity contrast study has been ordered to confirm wall motion abnormalities. FINDINGS  Left Ventricle: Left ventricular ejection fraction, by estimation, is 30 to 35%. The left ventricle has moderately decreased function. The left ventricle has no regional wall motion abnormalities. Definity contrast agent was given IV to delineate the left ventricular endocardial borders. The left ventricular internal cavity size was normal in size. There is no left ventricular hypertrophy. Left ventricular diastolic parameters are consistent with Grade II diastolic dysfunction (pseudonormalization). Elevated left ventricular end-diastolic pressure. Right Ventricle: The right ventricular size is normal. No increase in right ventricular wall thickness. Right ventricular systolic function is normal. There is moderately elevated pulmonary artery systolic pressure. The tricuspid regurgitant velocity is 3.11 m/s, and with an assumed right atrial pressure of 3 mmHg, the  estimated right ventricular systolic pressure is 81.8 mmHg. Left Atrium: Left atrial size was normal in size. Right Atrium: Right atrial size was normal in size. Pericardium: There is no evidence of pericardial effusion. Mitral Valve: The mitral valve is normal in structure. Normal mobility of the mitral valve leaflets. Mild mitral valve regurgitation. No evidence of mitral valve stenosis. Tricuspid Valve: The tricuspid valve is normal in structure. Tricuspid valve regurgitation is mild . No evidence of tricuspid stenosis. Aortic Valve: The aortic valve is normal in structure. Aortic valve regurgitation is not visualized. Mild to moderate aortic valve sclerosis/calcification is present, without any evidence of aortic stenosis. Pulmonic Valve: The pulmonic valve was normal in structure. Pulmonic valve regurgitation is not visualized. No evidence of pulmonic stenosis. Aorta: The aortic root is normal in size and structure. Venous: The inferior vena cava is normal in size with greater than 50% respiratory variability, suggesting right atrial pressure of 3 mmHg. IAS/Shunts: No atrial level shunt detected by color flow Doppler.  LEFT VENTRICLE PLAX 2D LVIDd:         4.40 cm  Diastology LVIDs:         2.60 cm  LV e' lateral:   7.51 cm/s LV PW:         1.10 cm  LV E/e' lateral: 14.0 LV IVS:        1.00 cm  LV e' medial:  5.45 cm/s LVOT diam:     2.10 cm  LV E/e' medial:  19.3 LV SV:         62 LV SV Index:   34 LVOT Area:     3.46 cm  RIGHT VENTRICLE RV S prime:     11.80 cm/s TAPSE (M-mode): 1.6 cm LEFT ATRIUM             Index       RIGHT ATRIUM           Index LA diam:        3.70 cm 2.03 cm/m  RA Area:     14.40 cm LA Vol (A2C):   64.2 ml 35.30 ml/m RA Volume:   32.90 ml  18.09 ml/m LA Vol (A4C):   47.5 ml 26.12 ml/m LA Biplane Vol: 57.4 ml 31.56 ml/m  AORTIC VALVE LVOT Vmax:   81.10 cm/s LVOT Vmean:  54.800 cm/s LVOT VTI:    0.180 m  AORTA Ao Root diam: 2.60 cm Ao Asc diam:  3.20 cm MITRAL VALVE                 TRICUSPID VALVE MV Area (PHT): 3.77 cm     TR Peak grad:   38.7 mmHg MV Decel Time: 201 msec     TR Vmax:        311.00 cm/s MR Peak grad: 92.2 mmHg MR Mean grad: 68.0 mmHg     SHUNTS MR Vmax:      480.00 cm/s   Systemic VTI:  0.18 m MR Vmean:     401.0 cm/s    Systemic Diam: 2.10 cm MV E velocity: 105.00 cm/s MV A velocity: 74.70 cm/s MV E/A ratio:  1.41 Fransico Him MD Electronically signed by Fransico Him MD Signature Date/Time: 07/28/2020/2:41:55 PM    Final    DG Hip Unilat W or Wo Pelvis 2-3 Views Right  Result Date: 07/07/2020 CLINICAL DATA:  Patient fell earlier today while outside at home. EXAM: DG HIP (WITH OR WITHOUT PELVIS) 2-3V RIGHT COMPARISON:  CT abdomen pelvis 07/06/2017 FINDINGS: Osteopenia. There is disruption of the iliopectineal line consistent with a displaced acetabular fracture. There is associated medial migration of the femoral head. The femoral head and proximal femur appear intact. IMPRESSION: Displaced fracture of the right acetabulum. Recommend CT pelvis for full characterization. Electronically Signed   By: Audie Pinto M.D.   On: 07/07/2020 18:10   VAS [place] LOWER EXTREMITY VENOUS (DVT)  Result Date: 07/28/2020  Lower Venous DVTStudy Indications: Pulmonary embolism.  Anticoagulation: Heparin. Limitations: Patient unable to cooperate/position for portions of exam. Comparison Study: No prior studies. Performing Technologist: Darlin Coco  Examination Guidelines: A complete evaluation includes B-mode imaging, spectral Doppler, color Doppler, and power Doppler as needed of all accessible portions of each vessel. Bilateral testing is considered an integral part of a complete examination. Limited examinations for reoccurring indications may be performed as noted. The reflux portion of the exam is performed with the patient in reverse Trendelenburg.  +---------+---------------+---------+-----------+----------+-----------------+ RIGHT     CompressibilityPhasicitySpontaneityPropertiesThrombus Aging    +---------+---------------+---------+-----------+----------+-----------------+ CFV      Full           Yes      Yes                                    +---------+---------------+---------+-----------+----------+-----------------+ SFJ      Full                                                           +---------+---------------+---------+-----------+----------+-----------------+  FV Prox  Full                                                           +---------+---------------+---------+-----------+----------+-----------------+ FV Mid   Full                                                           +---------+---------------+---------+-----------+----------+-----------------+ FV DistalFull                                                           +---------+---------------+---------+-----------+----------+-----------------+ PFV      Full                                                           +---------+---------------+---------+-----------+----------+-----------------+ POP      Full           Yes      Yes                                    +---------+---------------+---------+-----------+----------+-----------------+ PTV      Full                                                           +---------+---------------+---------+-----------+----------+-----------------+ PERO     Partial                                      Age Indeterminate +---------+---------------+---------+-----------+----------+-----------------+   +---------+---------------+---------+-----------+----------+--------------+ LEFT     CompressibilityPhasicitySpontaneityPropertiesThrombus Aging +---------+---------------+---------+-----------+----------+--------------+ CFV      Full           Yes      Yes                                  +---------+---------------+---------+-----------+----------+--------------+ SFJ      Full                                                        +---------+---------------+---------+-----------+----------+--------------+ FV Prox  Full                                                        +---------+---------------+---------+-----------+----------+--------------+  FV Mid   Full                                                        +---------+---------------+---------+-----------+----------+--------------+ FV DistalFull                                                        +---------+---------------+---------+-----------+----------+--------------+ PFV      Full                                                        +---------+---------------+---------+-----------+----------+--------------+ POP      Full           Yes      Yes                                 +---------+---------------+---------+-----------+----------+--------------+ PTV      Full                                                        +---------+---------------+---------+-----------+----------+--------------+ PERO                                                  Not visualized +---------+---------------+---------+-----------+----------+--------------+     Summary: RIGHT: - Findings consistent with partial, age indeterminate deep vein thrombosis involving a single right peroneal vein. - No cystic structure found in the popliteal fossa.  LEFT: - There is no evidence of deep vein thrombosis in the lower extremity. However, portions of this examination were limited- see technologist comments above.  - No cystic structure found in the popliteal fossa.  *See table(s) above for measurements and observations. Electronically signed by Harold Barban MD on 07/28/2020 at 5:50:15 PM.    Final      Time coordinating discharge: Over 75 minutes    Dwyane Dee, MD  [facility] 07/30/2020, 11:42  AM Pager: Secure chat  If 7PM-7AM, please contact night-coverage [email or web address] Password [email or web address]

## 2020-07-30 NOTE — NC FL2 (Signed)
[place] LEVEL OF CARE SCREENING TOOL     IDENTIFICATION  Patient Name: Cindy [name] Birthdate: 04/17/1930 Sex: female Admission Date (Current Location): 07/28/2020  [facility] [place] Number:  Herbalist and Address:  [facility],  [place], [place]      Provider Number: [id number]  Attending Physician Name and Address:  Dwyane Dee, MD  Relative Name and Phone Number:  Jerl Mina - daughter - [phone]    Current Level of Care: SNF Recommended Level of Care: [place] Prior Approval Number:    Date Approved/Denied:   PASRR Number: [id number]  Discharge Plan: SNF    Current Diagnoses: Patient Active Problem List   Diagnosis Date Noted  . DVT (deep venous thrombosis) ([place]) 07/29/2020  . Pulmonary embolus ([place]) 07/28/2020  . Anemia 07/28/2020  . Pulmonary embolism ([place]) 07/28/2020  . Acute respiratory failure with hypoxia ([place]) 07/28/2020  . Dementia without behavioral disturbance ([place])   . DNR (do not resuscitate)   . Palliative care by specialist   . Adult failure to thrive   . Acetabular fracture ([place]) 07/07/2020  . Sepsis ([place]) 03/09/2017  . Nausea vomiting and diarrhea 03/09/2017  . Elevated troponin 03/09/2017  . Chest pain 12/25/2013  . Dyspnea 12/25/2013  . Restless leg syndrome 12/25/2013  . Mucositis 10/27/2013  . Hypophosphatemia 10/27/2013  . Protein-calorie malnutrition, severe ([place]) 10/25/2013  . Thrombocytopenia ([place]) 10/25/2013  . Hypomagnesemia 10/24/2013  . Febrile neutropenia ([place]) 10/23/2013  . Anal cancer ([place]) 09/09/2013  . Colon cancer ([place]) 08/31/2013  . Colitis, acute 03/17/2013  . Gastroparesis 10/22/2012  . Hand pain, left 10/22/2012  . Anxiety 10/18/2012  . Hyperventilation syndrome 10/18/2012  . Headache(784.0) 10/18/2012  . Neck pain on right side 10/18/2012  . Hypertension 10/18/2012  . GERD (gastroesophageal reflux disease) 10/18/2012  .  Hyperlipidemia 10/18/2012  . Insomnia 10/18/2012  . Hearing loss sensory, bilateral 10/18/2012  . 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 Weakness generalized 10/17/2012  . Hyponatremia 10/17/2012  . Hypokalemia 10/17/2012  . Hypothyroidism 10/17/2012  . Fatigue 10/17/2012    Orientation RESPIRATION BLADDER Height & Weight     Self, Time, Situation, Place  O2 Continent Weight: 70.3 kg Height:  5\' 6"  (167.6 cm)  BEHAVIORAL SYMPTOMS/MOOD NEUROLOGICAL BOWEL NUTRITION STATUS      Continent Diet  AMBULATORY STATUS COMMUNICATION OF NEEDS Skin   Extensive Assist Verbally                         Personal Care Assistance Level of Assistance  Bathing, Dressing Bathing Assistance: Limited assistance   Dressing Assistance: Limited assistance     Functional Limitations Info  Sight, Hearing, Speech Sight Info: Adequate Hearing Info: Adequate Speech Info: Adequate    SPECIAL CARE FACTORS FREQUENCY  PT (By licensed PT), OT (By licensed OT)     PT Frequency: 5x weekly OT Frequency: 5x weekly            Contractures Contractures Info: Not present    Additional Factors Info  Code Status, Allergies Code Status Info: DNR Allergies Info: Vancomycin, Zosyn, Hydrocodone, Morphine           Current Medications (07/30/2020):  This is the current hospital active medication list Current Facility-Administered Medications  Medication Dose Route Frequency Provider Last Rate Last Admin  . ALPRAZolam Duanne Moron) tablet 0.25 mg  0.25 mg Oral QHS Dwyane Dee, MD   0.25 mg at  07/29/20 2112  . apixaban (ELIQUIS) tablet 10 mg  10 mg Oral BID Dwyane Dee, MD   10 mg at 07/30/20 1059   Followed by  . [START ON 08/05/2020] apixaban (ELIQUIS) tablet 5 mg  5 mg Oral BID Dwyane Dee, MD      . buPROPion [facility] SR) 12 hr tablet 200 mg  200 mg Oral Daily Dwyane Dee, MD   200 mg at 07/30/20 1059  . docusate sodium (COLACE) capsule 100 mg  100 mg Oral BID Dwyane Dee, MD    100 mg at 07/30/20 6789  . escitalopram (LEXAPRO) tablet 10 mg  10 mg Oral Daily Dwyane Dee, MD   10 mg at 07/30/20 0811  . HYDROmorphone (DILAUDID) tablet 2 mg  2 mg Oral Q3H PRN Dwyane Dee, MD   2 mg at 07/30/20 3810  . levothyroxine (SYNTHROID) tablet 88 mcg  88 mcg Oral QAC breakfast Dwyane Dee, MD   88 mcg at 07/30/20 0517  . lip balm (CARMEX) ointment   Topical PRN Dwyane Dee, MD      . methocarbamol (ROBAXIN) tablet 500 mg  500 mg Oral TID Dwyane Dee, MD   500 mg at 07/30/20 1751  . ondansetron (ZOFRAN) tablet 4 mg  4 mg Oral Q6H PRN Dwyane Dee, MD       Or  . ondansetron [facility]) injection 4 mg  4 mg Intravenous Q6H PRN Dwyane Dee, MD      . pantoprazole (PROTONIX) EC tablet 40 mg  40 mg Oral Daily Dwyane Dee, MD   40 mg at 07/30/20 0258  . polyethylene glycol (MIRALAX / GLYCOLAX) packet 17 g  17 g Oral Daily Dwyane Dee, MD   17 g at 07/30/20 5277     Discharge Medications: Please see discharge summary for a list of discharge medications.  Relevant Imaging Results:  Relevant Lab Results:   Additional Information SS# [id number]  Joaquin Courts, RN

## 2020-07-30 NOTE — Discharge Instructions (Signed)
Information on my medicine - ELIQUIS (apixaban)  This medication education was reviewed with me or my healthcare representative as part of my discharge preparation.  The pharmacist that spoke with me during my hospital stay was:  Penni Homans, Student-PharmD  Why was Eliquis prescribed for you? Eliquis was prescribed to treat blood clots that may have been found in the veins of your legs (deep vein thrombosis) or in your lungs (pulmonary embolism) and to reduce the risk of them occurring again.  What do You need to know about Eliquis ? The starting dose is 10 mg (two 5 mg tablets) taken TWICE daily for the FIRST SEVEN (7) DAYS, then on Sunday 08/05/2020, the dose is reduced to ONE 5 mg tablet taken TWICE daily.  Eliquis may be taken with or without food.   Try to take the dose about the same time in the morning and in the evening. If you have difficulty swallowing the tablet whole please discuss with your pharmacist how to take the medication safely.  Take Eliquis exactly as prescribed and DO NOT stop taking Eliquis without talking to the doctor who prescribed the medication.  Stopping may increase your risk of developing a new blood clot.  Refill your prescription before you run out.  After discharge, you should have regular check-up appointments with your healthcare provider that is prescribing your Eliquis.    What do you do if you miss a dose? If a dose of ELIQUIS is not taken at the scheduled time, take it as soon as possible on the same day and twice-daily administration should be resumed. The dose should not be doubled to make up for a missed dose.  Important Safety Information A possible side effect of Eliquis is bleeding. You should call your healthcare provider right away if you experience any of the following: ? Bleeding from an injury or your nose that does not stop. ? Unusual colored urine (red or dark brown) or unusual colored stools (red or black). ? Unusual  bruising for unknown reasons. ? A serious fall or if you hit your head (even if there is no bleeding).  Some medicines may interact with Eliquis and might increase your risk of bleeding or clotting while on Eliquis. To help avoid this, consult your healthcare provider or pharmacist prior to using any new prescription or non-prescription medications, including herbals, vitamins, non-steroidal anti-inflammatory drugs (NSAIDs) and supplements.  This website has more information on Eliquis (apixaban): http://www.eliquis.com/eliquis/home

## 2020-07-30 NOTE — Progress Notes (Signed)
Patient to be transferred to [facility], via [facility], report called to receiving facility

## 2020-07-30 NOTE — TOC Initial Note (Signed)
Transition of Care [facility]) - Initial/Assessment Note    Patient Details  Name: Cindy [name] MRN: [id number] Date of Birth: Nov 23, 1930  Transition of Care (TOC) CM/SW Contact:    Joaquin Courts, RN Phone Number: 07/30/2020, 12:34 PM  Clinical Narrative:                 CM confirms patient was at [facility] side manor prior to admission for short term rehab.  Per patient's daughter plan is to return and continue rehab before retuning home.  CM spoke with facility rep who reports patient can return today.  FL2 and dc summary faxed to facility. Patient will discharge to room 38, PTAR transport arranged.  Expected Discharge Plan: Skilled Nursing Facility Barriers to Discharge: No Barriers Identified   Patient Goals and CMS Choice Patient states their goals for this hospitalization and ongoing recovery are:: go back to rehab CMS Medicare.gov Compare Post Acute Care list provided to:: Patient Represenative (must comment) Choice offered to / list presented to : Patient, Adult Children  Expected Discharge Plan and Services Expected Discharge Plan: [place]   Discharge Planning Services: CM Consult Post Acute Care Choice: [place] Living arrangements for the past 2 months: Single Family Home Expected Discharge Date: 07/30/20               DME Arranged: N/A         HH Arranged: NA HH Agency: NA        Prior Living Arrangements/Services Living arrangements for the past 2 months: Single Family Home Lives with:: Self Patient language and need for interpreter reviewed:: Yes Do you feel safe going back to the place where you live?: Yes      Need for Family Participation in Patient Care: Yes (Comment) Care giver support system in place?: Yes (comment)   Criminal Activity/Legal Involvement Pertinent to Current Situation/Hospitalization: No - Comment as needed  Activities of Daily Living      Permission Sought/Granted                   Emotional Assessment Appearance:: Appears stated age Attitude/Demeanor/Rapport: Engaged Affect (typically observed): Accepting Orientation: : Oriented to Self, Oriented to Place, Oriented to  Time, Oriented to Situation   Psych Involvement: No (comment)  Admission diagnosis:  Pulmonary embolus (HCC) [I26.99] Pulmonary embolism (HCC) [I26.99] NSTEMI (non-ST elevated myocardial infarction) ([place]) [I21.4] Other acute pulmonary embolism without acute cor pulmonale ([place]) [I26.99] Anemia, unspecified type [D64.9] Patient Active Problem List   Diagnosis Date Noted  . DVT (deep venous thrombosis) ([place]) 07/29/2020  . Pulmonary embolus ([place]) 07/28/2020  . Anemia 07/28/2020  . Pulmonary embolism ([place]) 07/28/2020  . Acute respiratory failure with hypoxia ([place]) 07/28/2020  . Dementia without behavioral disturbance ([place])   . DNR (do not resuscitate)   . Palliative care by specialist   . Adult failure to thrive   . Acetabular fracture ([place]) 07/07/2020  . Sepsis ([place]) 03/09/2017  . Nausea vomiting and diarrhea 03/09/2017  . Elevated troponin 03/09/2017  . Chest pain 12/25/2013  . Dyspnea 12/25/2013  . Restless leg syndrome 12/25/2013  . Mucositis 10/27/2013  . Hypophosphatemia 10/27/2013  . Protein-calorie malnutrition, severe ([place]) 10/25/2013  . Thrombocytopenia ([place]) 10/25/2013  . Hypomagnesemia 10/24/2013  . Febrile neutropenia ([place]) 10/23/2013  . Anal cancer ([place]) 09/09/2013  . Colon cancer ([place]) 08/31/2013  . Colitis, acute 03/17/2013  . Gastroparesis 10/22/2012  . Hand pain, left 10/22/2012  . Anxiety 10/18/2012  . Hyperventilation syndrome 10/18/2012  .  Headache(784.0) 10/18/2012  . Neck pain on right side 10/18/2012  . Hypertension 10/18/2012  . GERD (gastroesophageal reflux disease) 10/18/2012  . Hyperlipidemia 10/18/2012  . Insomnia 10/18/2012  . Hearing loss sensory, bilateral 10/18/2012  . 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 Weakness generalized  10/17/2012  . Hyponatremia 10/17/2012  . Hypokalemia 10/17/2012  . Hypothyroidism 10/17/2012  . Fatigue 10/17/2012   PCP:  Josetta Huddle, MD Pharmacy:   [id number] [address], [place] [address] Phone: [phone] Fax: [phone]  [facility] [place], [address] [place] [address] Phone: [phone] Fax: [place], [place] - [address] [place] [address] Phone: [phone] Fax: [phone]     Social Determinants of Health ([place]) Interventions    Readmission Risk Interventions Readmission Risk Prevention Plan 07/10/2020  Transportation Screening Complete  PCP or Specialist Appt within 5-7 Days Complete  Home Care Screening Complete  Medication Review (RN CM) Complete  Some recent data might be hidden

## 2020-08-01 DIAGNOSIS — J9601 Acute respiratory failure with hypoxia: Secondary | ICD-10-CM | POA: Diagnosis not present

## 2020-08-01 DIAGNOSIS — K5900 Constipation, unspecified: Secondary | ICD-10-CM | POA: Diagnosis not present

## 2020-08-01 DIAGNOSIS — Z743 Need for continuous supervision: Secondary | ICD-10-CM | POA: Diagnosis not present

## 2020-08-01 DIAGNOSIS — R4182 Altered mental status, unspecified: Secondary | ICD-10-CM | POA: Diagnosis not present

## 2020-08-01 DIAGNOSIS — I82409 Acute embolism and thrombosis of unspecified deep veins of unspecified lower extremity: Secondary | ICD-10-CM | POA: Diagnosis not present

## 2020-08-01 DIAGNOSIS — I10 Essential (primary) hypertension: Secondary | ICD-10-CM | POA: Diagnosis not present

## 2020-08-01 DIAGNOSIS — I2699 Other pulmonary embolism without acute cor pulmonale: Secondary | ICD-10-CM | POA: Diagnosis not present

## 2020-08-01 DIAGNOSIS — F29 Unspecified psychosis not due to a substance or known physiological condition: Secondary | ICD-10-CM | POA: Diagnosis not present

## 2020-08-01 DIAGNOSIS — R52 Pain, unspecified: Secondary | ICD-10-CM | POA: Diagnosis not present

## 2020-08-01 DIAGNOSIS — R279 Unspecified lack of coordination: Secondary | ICD-10-CM | POA: Diagnosis not present

## 2020-08-01 DIAGNOSIS — S32401D Unspecified fracture of right acetabulum, subsequent encounter for fracture with routine healing: Secondary | ICD-10-CM | POA: Diagnosis not present

## 2020-08-06 DIAGNOSIS — R630 Anorexia: Secondary | ICD-10-CM | POA: Diagnosis not present

## 2020-08-06 DIAGNOSIS — K5900 Constipation, unspecified: Secondary | ICD-10-CM | POA: Diagnosis not present

## 2020-08-06 DIAGNOSIS — I10 Essential (primary) hypertension: Secondary | ICD-10-CM | POA: Diagnosis not present

## 2020-08-06 DIAGNOSIS — I82409 Acute embolism and thrombosis of unspecified deep veins of unspecified lower extremity: Secondary | ICD-10-CM | POA: Diagnosis not present

## 2020-08-06 DIAGNOSIS — I2699 Other pulmonary embolism without acute cor pulmonale: Secondary | ICD-10-CM | POA: Diagnosis not present

## 2020-08-09 DIAGNOSIS — R630 Anorexia: Secondary | ICD-10-CM | POA: Diagnosis not present

## 2020-08-09 DIAGNOSIS — K5900 Constipation, unspecified: Secondary | ICD-10-CM | POA: Diagnosis not present

## 2020-08-09 DIAGNOSIS — J9601 Acute respiratory failure with hypoxia: Secondary | ICD-10-CM | POA: Diagnosis not present

## 2020-08-09 DIAGNOSIS — I10 Essential (primary) hypertension: Secondary | ICD-10-CM | POA: Diagnosis not present

## 2020-08-13 ENCOUNTER — Other Ambulatory Visit: Payer: Self-pay

## 2020-08-13 ENCOUNTER — Encounter: Payer: Self-pay | Admitting: Adult Health

## 2020-08-13 ENCOUNTER — Ambulatory Visit (INDEPENDENT_AMBULATORY_CARE_PROVIDER_SITE_OTHER): Payer: Medicare Other | Admitting: Adult Health

## 2020-08-13 VITALS — BP 114/62 | HR 74 | Temp 98.3°F | Ht 65.0 in

## 2020-08-13 DIAGNOSIS — I82451 Acute embolism and thrombosis of right peroneal vein: Secondary | ICD-10-CM | POA: Diagnosis not present

## 2020-08-13 DIAGNOSIS — I2699 Other pulmonary embolism without acute cor pulmonale: Secondary | ICD-10-CM | POA: Diagnosis not present

## 2020-08-13 DIAGNOSIS — I5040 Unspecified combined systolic (congestive) and diastolic (congestive) heart failure: Secondary | ICD-10-CM | POA: Diagnosis not present

## 2020-08-13 DIAGNOSIS — I5043 Acute on chronic combined systolic (congestive) and diastolic (congestive) heart failure: Secondary | ICD-10-CM

## 2020-08-13 NOTE — Patient Instructions (Addendum)
Continue on Eliquis.  Discuss with Dr. Marcelino Scot regarding advil usage, use with caution as on Eliquis .  Stop Premarin .  Venous doppler in 3 months .  Refer to cardiology  Follow up with Dr. Carlis Abbott in 3 months and As needed

## 2020-08-13 NOTE — Progress Notes (Signed)
@Patient  ID: Cindy [name], female    DOB: 22-Jan-1930, 86 y.o.   MRN: [id number]  Chief Complaint  Patient presents with  . Follow-up    Referring provider: Josetta Huddle, MD  HPI: 86 year old female never smoker seen for pulmonary consult during hospitalization for right hip fracture for PE.  TEST/EVENTS :    08/13/2020 follow-up: PE, post hospital follow-up Patient returns for a post hospital follow-up. Patient had a mechanical fall at home July 07, 2020. Fracture was considered too high risk and patient was placed in rehab for bedrest and physical therapy. Patient developed shortness of breath and was brought to the emergency room from rehab on July 28, 2020. CT chest showed bilateral pulmonary emboli involving the right middle and lobar arteries along with the right and upper lobe segmental branches. 2D echo showed decreased EF at 30 to 53%, grade 2 diastolic dysfunction and moderately elevated pulmonary artery pressures RV size was normal. Normal right ventricle systolic function. No evidence of heart strain. Lower extremity Doppler showed a single right peroneal vein DVT. Patient was treated with IV heparin and transition to Eliquis. This was felt to be a provoked PE and DVT due to hip fracture and immobility. With plans to complete 3 months of anticoagulation. Prior to hip fracture patient lived independently was very active. Since discharge back to the rehab patient says she is doing much better. Her breathing has improved she has less shortness of breath. She is slowly starting to increase her activity and is starting to be able to do light standing with limited weightbearing with physical therapy assistance. Records from discharge and current medication list from rehab indicate patient is taking Advil 800 mg 3 times daily and Premarin. Patient is accompanied by her family member. She denies any hemoptysis chest pain orthopnea or increased leg swelling.  Allergies  Allergen  Reactions  . Vancomycin Hives    Zosyn and vanc given together; unclear which was precipitating med. Appears to have tolerated cephalosporins in the past.  . Zosyn [Piperacillin Sod-Tazobactam So] Hives    Zosyn and vanc given together; unclear which was precipitating med. Appears to have tolerated cephalosporins in the past.  . Hydrocodone Nausea Only  . Morphine And Related Hives    Immunization History  Administered Date(s) Administered  . Influenza,inj,Quad PF,6+ Mos 10/10/2013  . PFIZER SARS-COV-2 Vaccination 02/23/2020, 03/20/2020  . Pneumococcal Polysaccharide-23 03/18/2013    Past Medical History:  Diagnosis Date  . Arthritis   . Cancer ([place])    skin  . Colon cancer ([place]) 08/31/13   invasive squamous cell  . Deaf, left   . Depression   . GERD (gastroesophageal reflux disease)   . History of radiation therapy 10/10/13-11/23/13   anal canal 54GY/  . Hyperlipemia   . Hypertension   . Hypothyroidism   . PONV (postoperative nausea and vomiting)   . Skin cancer    Shingles 2012 Bad case    Tobacco History: Social History   Tobacco Use  Smoking Status Never Smoker  Smokeless Tobacco Never Used   Counseling given: Not Answered   Outpatient Medications Prior to Visit  Medication Sig Dispense Refill  . acetaminophen (TYLENOL) 325 MG tablet Take 650 mg by mouth every 6 (six) hours as needed for moderate pain.    Marland Kitchen ALPRAZolam (XANAX) 0.25 MG tablet Take 1 tablet (0.25 mg total) by mouth at bedtime as needed for anxiety or sleep. 30 tablet 0  . amLODipine (NORVASC) 5 MG tablet Take 1 tablet (  5 mg total) by mouth daily.    Marland Kitchen apixaban (ELIQUIS) 5 MG TABS tablet Take 1 tablet (5 mg total) by mouth 2 (two) times daily. Starting 8/15 after completes 10 mg BID ending 8/14 60 tablet   . buPROPion (WELLBUTRIN SR) 200 MG 12 hr tablet Take 200 mg by mouth daily.     Marland Kitchen docusate sodium (COLACE) 100 MG capsule Take 100 mg by mouth 2 (two) times daily.    Marland Kitchen escitalopram (LEXAPRO) 10  MG tablet Take 10 mg by mouth daily.    Marland Kitchen ibuprofen (ADVIL) 200 MG tablet Take 200 mg by mouth every 6 (six) hours as needed.    Marland Kitchen levothyroxine (SYNTHROID, LEVOTHROID) 88 MCG tablet Take 88 mcg by mouth daily before breakfast.    . methocarbamol (ROBAXIN) 500 MG tablet Take 500 mg by mouth 3 (three) times daily.    . ondansetron (ZOFRAN ODT) 4 MG disintegrating tablet Take 1 tablet (4 mg total) by mouth every 8 (eight) hours as needed for nausea or vomiting. 10 tablet 0  . pantoprazole (PROTONIX) 40 MG tablet Take 40 mg by mouth daily.    . polyethylene glycol (MIRALAX / GLYCOLAX) 17 g packet Take 17 g by mouth daily.    Marland Kitchen PREMARIN 0.3 MG tablet Take 0.3 mg by mouth daily.    . traMADol (ULTRAM) 50 MG tablet Take 50 mg by mouth every 6 (six) hours as needed.    Marland Kitchen apixaban (ELIQUIS) 5 MG TABS tablet Take 2 tablets (10 mg total) by mouth 2 (two) times daily for 5 days. 60 tablet   . HYDROmorphone (DILAUDID) 2 MG tablet Take 1 tablet (2 mg total) by mouth every 3 (three) hours as needed for severe pain. (Patient not taking: Reported on 08/13/2020) 30 tablet 0   No facility-administered medications prior to visit.     Review of Systems:   Constitutional:   No  weight loss, night sweats,  Fevers, chills,  +fatigue, or  lassitude.  HEENT:   No headaches,  Difficulty swallowing,  Tooth/dental problems, or  Sore throat,                No sneezing, itching, ear ache, nasal congestion, post nasal drip,   CV:  No chest pain,  Orthopnea, PND, swelling in lower extremities, anasarca, dizziness, palpitations, syncope.   GI  No heartburn, indigestion, abdominal pain, nausea, vomiting, diarrhea, change in bowel habits, loss of appetite, bloody stools.   Resp: No shortness of breath with exertion or at rest.  No excess mucus, no productive cough,  No non-productive cough,  No coughing up of blood.  No change in color of mucus.  No wheezing.  No chest wall deformity  Skin: no rash or lesions.  GU: no  dysuria, change in color of urine, no urgency or frequency.  No flank pain, no hematuria   MS: Positive hip pain    Physical Exam  BP 114/62 (BP Location: Right Arm, Cuff Size: Normal)   Pulse 74   Temp 98.3 F (36.8 C) (Other (Comment)) Comment (Src): wrist  Ht 5\' 5"  (1.651 m)   SpO2 96% Comment: room air  BMI 25.79 kg/m   GEN: A/Ox3; pleasant , NAD, elderly, in recliner-chair bound   HEENT:  [place]/AT, , NOSE-clear, THROAT-clear, no lesions, no postnasal drip or exudate noted.   NECK:  Supple w/ fair ROM; no JVD; normal carotid impulses w/o bruits; no thyromegaly or nodules palpated; no lymphadenopathy.    RESP  Clear  P & A; w/o, wheezes/ rales/ or rhonchi. no accessory muscle use, no dullness to percussion  CARD:  RRR, no m/r/g, tr peripheral edema, pulses intact, no cyanosis or clubbing.  GI:   Soft & nt; nml bowel sounds; no organomegaly or masses detected.   Musco: Warm bil, no deformities or joint swelling noted.   Neuro: alert, no focal deficits noted.    Skin: Warm, no lesions or rashes    Lab Results:  CBC    Component Value Date/Time   WBC 6.7 07/30/2020 0501   RBC 2.57 (L) 07/30/2020 0501   HGB 8.1 (L) 07/30/2020 0501   HGB 10.3 (L) 12/02/2013 1330   HCT 25.8 (L) 07/30/2020 0501   HCT 30.4 (L) 12/02/2013 1330   PLT 342 07/30/2020 0501   PLT 260 12/02/2013 1330   MCV 100.4 (H) 07/30/2020 0501   MCV 94.7 12/02/2013 1330   MCH 31.5 07/30/2020 0501   MCHC 31.4 07/30/2020 0501   RDW 12.2 07/30/2020 0501   RDW 19.2 (H) 12/02/2013 1330   LYMPHSABS 0.8 07/28/2020 0235   LYMPHSABS 5.8 (H) 12/02/2013 1330   MONOABS 0.5 07/28/2020 0235   MONOABS 1.0 (H) 12/02/2013 1330   EOSABS 0.0 07/28/2020 0235   EOSABS 0.1 12/02/2013 1330   BASOSABS 0.0 07/28/2020 0235   BASOSABS 0.1 12/02/2013 1330    BMET    Component Value Date/Time   NA 127 (L) 07/30/2020 0501   NA 130 (L) 12/02/2013 1330   K 4.1 07/30/2020 0501   K 4.3 12/02/2013 1330   CL 93 (L)  07/30/2020 0501   CO2 24 07/30/2020 0501   CO2 22 12/02/2013 1330   GLUCOSE 104 (H) 07/30/2020 0501   GLUCOSE 99 12/02/2013 1330   BUN 18 07/30/2020 0501   BUN 7.5 12/02/2013 1330   CREATININE 1.00 07/30/2020 0501   CREATININE 0.90 11/07/2014 1448   CREATININE 0.8 12/02/2013 1330   CALCIUM 8.4 (L) 07/30/2020 0501   CALCIUM 9.4 12/02/2013 1330   GFRNONAA 50 (L) 07/30/2020 0501   GFRAA 58 (L) 07/30/2020 0501    BNP    Component Value Date/Time   BNP 969.7 (H) 07/28/2020 1453    ProBNP No results found for: PROBNP  Imaging: CT Angio Chest PE W/Cm &/Or Wo Cm  Result Date: 07/28/2020 CLINICAL DATA:  Hypoxia.  PE suspected, high prob EXAM: CT ANGIOGRAPHY CHEST WITH CONTRAST TECHNIQUE: Multidetector CT imaging of the chest was performed using the standard protocol during bolus administration of intravenous contrast. Multiplanar CT image reconstructions and MIPs were obtained to evaluate the vascular anatomy. CONTRAST:  60mL OMNIPAQUE IOHEXOL 350 MG/ML SOLN COMPARISON:  None. FINDINGS: Cardiovascular: Contrast injection is sufficient to demonstrate satisfactory opacification of the pulmonary arteries to the segmental level. There is a large burden of pulmonary emboli within the right lung, predominantly within the right middle and lower lobar arteries. There are small pulmonary emboli in the right and left upper lobe segmental branches. There is no evidence of right heart strain with RV/LV ratio of 0.75. the size of the main pulmonary artery is normal. Mild cardiomegaly. The course and caliber of the aorta are normal. There is mild atherosclerotic calcification. Opacification decreased due to pulmonary arterial phase contrast bolus timing. Mediastinum/Nodes: No mediastinal, hilar or axillary lymphadenopathy. Normal visualized thyroid. Thoracic esophageal course is normal. Lungs/Pleura: Small pleural effusions, right greater than left. Bibasilar atelectasis. Upper Abdomen: Contrast bolus timing is  not optimized for evaluation of the abdominal organs. The visualized portions of the organs of  the upper abdomen are normal. Musculoskeletal: No chest wall abnormality. No bony spinal canal stenosis. Review of the MIP images confirms the above findings. IMPRESSION: 1. Large burden of pulmonary emboli within the right lung, predominantly within the right middle and lower lobar arteries. Small pulmonary emboli in the right and left upper lobe segmental branches. No evidence of right heart strain with RV/LV ratio of 0.75. 2. Small pleural effusions, right greater than left. 3. Aortic Atherosclerosis (ICD10-I70.0). Critical Value/emergent results were called by telephone at the time of interpretation on 07/28/2020 at 6:00 am to provider IVA KNAPP , who verbally acknowledged these results. Electronically Signed   By: Ulyses Jarred M.D.   On: 07/28/2020 06:03   DG Chest Port 1 View  Result Date: 07/28/2020 CLINICAL DATA:  Shortness of breath and chest pain EXAM: PORTABLE CHEST 1 VIEW COMPARISON:  07/07/2020 FINDINGS: Cardiac enlargement. Mild interstitial pattern to the lungs with peribronchial thickening. This may represent bronchiolitis or airways disease. No focal consolidation. No pleural effusions. No pneumothorax. Mediastinal contours appear intact. IMPRESSION: Cardiac enlargement. Interstitial pattern to the lungs may represent bronchiolitis or airways disease. Electronically Signed   By: Lucienne Capers M.D.   On: 07/28/2020 02:34   ECHOCARDIOGRAM COMPLETE  Result Date: 07/28/2020    ECHOCARDIOGRAM REPORT   Patient Name:   SHERRINA ZAUGG Date of Exam: 07/28/2020 Medical Rec #:  [id number]          Height:       66.0 in Accession #:    [id number]         Weight:       160.0 lb Date of Birth:  11/05/1930          BSA:          1.819 m Patient Age:    50 years           BP:           119/66 mmHg Patient Gender: F                  HR:           78 bpm. Exam Location:  Inpatient Procedure: 2D Echo and  Intracardiac Opacification Agent Indications:    Pulmonary Embolus I26.99  History:        Patient has prior history of Echocardiogram examinations, most                 recent 04/28/2017. Risk Factors:Hypertension and Dyslipidemia.  Sonographer:    Mikki Santee RDCS (AE) Referring Phys: [place]  1. Left ventricular ejection fraction, by estimation, is 30-35%. The left ventricle has mildly decreased function. The left ventricle has no regional wall motion abnormalities. Left ventricular diastolic parameters are consistent with Grade II diastolic  dysfunction (pseudonormalization). Elevated left ventricular end-diastolic pressure. There is possible akinesis of the left ventricular, apical septal wall, apical segment, anterior wall and inferolateral wall. There is akinesis of the left ventricular,  mid anteroseptal wall.  2. Right ventricular systolic function is normal. The right ventricular size is normal. There is moderately elevated pulmonary artery systolic pressure. The estimated right ventricular systolic pressure is 82.9 mmHg.  3. The mitral valve is normal in structure. Mild mitral valve regurgitation. No evidence of mitral stenosis.  4. The aortic valve is normal in structure. Aortic valve regurgitation is not visualized. Mild to moderate aortic valve sclerosis/calcification is present, without any evidence of aortic stenosis.  5. The inferior vena cava is  normal in size with greater than 50% respiratory variability, suggesting right atrial pressure of 3 mmHg.  6. Definity contrast study has been ordered to confirm wall motion abnormalities. FINDINGS  Left Ventricle: Left ventricular ejection fraction, by estimation, is 30 to 35%. The left ventricle has moderately decreased function. The left ventricle has no regional wall motion abnormalities. Definity contrast agent was given IV to delineate the left ventricular endocardial borders. The left ventricular internal cavity size  was normal in size. There is no left ventricular hypertrophy. Left ventricular diastolic parameters are consistent with Grade II diastolic dysfunction (pseudonormalization). Elevated left ventricular end-diastolic pressure. Right Ventricle: The right ventricular size is normal. No increase in right ventricular wall thickness. Right ventricular systolic function is normal. There is moderately elevated pulmonary artery systolic pressure. The tricuspid regurgitant velocity is 3.11 m/s, and with an assumed right atrial pressure of 3 mmHg, the estimated right ventricular systolic pressure is 35.0 mmHg. Left Atrium: Left atrial size was normal in size. Right Atrium: Right atrial size was normal in size. Pericardium: There is no evidence of pericardial effusion. Mitral Valve: The mitral valve is normal in structure. Normal mobility of the mitral valve leaflets. Mild mitral valve regurgitation. No evidence of mitral valve stenosis. Tricuspid Valve: The tricuspid valve is normal in structure. Tricuspid valve regurgitation is mild . No evidence of tricuspid stenosis. Aortic Valve: The aortic valve is normal in structure. Aortic valve regurgitation is not visualized. Mild to moderate aortic valve sclerosis/calcification is present, without any evidence of aortic stenosis. Pulmonic Valve: The pulmonic valve was normal in structure. Pulmonic valve regurgitation is not visualized. No evidence of pulmonic stenosis. Aorta: The aortic root is normal in size and structure. Venous: The inferior vena cava is normal in size with greater than 50% respiratory variability, suggesting right atrial pressure of 3 mmHg. IAS/Shunts: No atrial level shunt detected by color flow Doppler.  LEFT VENTRICLE PLAX 2D LVIDd:         4.40 cm  Diastology LVIDs:         2.60 cm  LV e' lateral:   7.51 cm/s LV PW:         1.10 cm  LV E/e' lateral: 14.0 LV IVS:        1.00 cm  LV e' medial:    5.45 cm/s LVOT diam:     2.10 cm  LV E/e' medial:  19.3 LV SV:          62 LV SV Index:   34 LVOT Area:     3.46 cm  RIGHT VENTRICLE RV S prime:     11.80 cm/s TAPSE (M-mode): 1.6 cm LEFT ATRIUM             Index       RIGHT ATRIUM           Index LA diam:        3.70 cm 2.03 cm/m  RA Area:     14.40 cm LA Vol (A2C):   64.2 ml 35.30 ml/m RA Volume:   32.90 ml  18.09 ml/m LA Vol (A4C):   47.5 ml 26.12 ml/m LA Biplane Vol: 57.4 ml 31.56 ml/m  AORTIC VALVE LVOT Vmax:   81.10 cm/s LVOT Vmean:  54.800 cm/s LVOT VTI:    0.180 m  AORTA Ao Root diam: 2.60 cm Ao Asc diam:  3.20 cm MITRAL VALVE                TRICUSPID VALVE MV Area (PHT):  3.77 cm     TR Peak grad:   38.7 mmHg MV Decel Time: 201 msec     TR Vmax:        311.00 cm/s MR Peak grad: 92.2 mmHg MR Mean grad: 68.0 mmHg     SHUNTS MR Vmax:      480.00 cm/s   Systemic VTI:  0.18 m MR Vmean:     401.0 cm/s    Systemic Diam: 2.10 cm MV E velocity: 105.00 cm/s MV A velocity: 74.70 cm/s MV E/A ratio:  1.41 Fransico Him MD Electronically signed by Fransico Him MD Signature Date/Time: 07/28/2020/2:41:55 PM    Final    VAS [place] LOWER EXTREMITY VENOUS (DVT)  Result Date: 07/28/2020  Lower Venous DVTStudy Indications: Pulmonary embolism.  Anticoagulation: Heparin. Limitations: Patient unable to cooperate/position for portions of exam. Comparison Study: No prior studies. Performing Technologist: Darlin Coco  Examination Guidelines: A complete evaluation includes B-mode imaging, spectral Doppler, color Doppler, and power Doppler as needed of all accessible portions of each vessel. Bilateral testing is considered an integral part of a complete examination. Limited examinations for reoccurring indications may be performed as noted. The reflux portion of the exam is performed with the patient in reverse Trendelenburg.  +---------+---------------+---------+-----------+----------+-----------------+ RIGHT    CompressibilityPhasicitySpontaneityPropertiesThrombus Aging     +---------+---------------+---------+-----------+----------+-----------------+ CFV      Full           Yes      Yes                                    +---------+---------------+---------+-----------+----------+-----------------+ SFJ      Full                                                           +---------+---------------+---------+-----------+----------+-----------------+ FV Prox  Full                                                           +---------+---------------+---------+-----------+----------+-----------------+ FV Mid   Full                                                           +---------+---------------+---------+-----------+----------+-----------------+ FV DistalFull                                                           +---------+---------------+---------+-----------+----------+-----------------+ PFV      Full                                                           +---------+---------------+---------+-----------+----------+-----------------+  POP      Full           Yes      Yes                                    +---------+---------------+---------+-----------+----------+-----------------+ PTV      Full                                                           +---------+---------------+---------+-----------+----------+-----------------+ PERO     Partial                                      Age Indeterminate +---------+---------------+---------+-----------+----------+-----------------+   +---------+---------------+---------+-----------+----------+--------------+ LEFT     CompressibilityPhasicitySpontaneityPropertiesThrombus Aging +---------+---------------+---------+-----------+----------+--------------+ CFV      Full           Yes      Yes                                 +---------+---------------+---------+-----------+----------+--------------+ SFJ      Full                                                         +---------+---------------+---------+-----------+----------+--------------+ FV Prox  Full                                                        +---------+---------------+---------+-----------+----------+--------------+ FV Mid   Full                                                        +---------+---------------+---------+-----------+----------+--------------+ FV DistalFull                                                        +---------+---------------+---------+-----------+----------+--------------+ PFV      Full                                                        +---------+---------------+---------+-----------+----------+--------------+ POP      Full           Yes      Yes                                 +---------+---------------+---------+-----------+----------+--------------+  PTV      Full                                                        +---------+---------------+---------+-----------+----------+--------------+ PERO                                                  Not visualized +---------+---------------+---------+-----------+----------+--------------+     Summary: RIGHT: - Findings consistent with partial, age indeterminate deep vein thrombosis involving a single right peroneal vein. - No cystic structure found in the popliteal fossa.  LEFT: - There is no evidence of deep vein thrombosis in the lower extremity. However, portions of this examination were limited- see technologist comments above.  - No cystic structure found in the popliteal fossa.  *See table(s) above for measurements and observations. Electronically signed by Harold Barban MD on 07/28/2020 at 5:50:15 PM.    Final       No flowsheet data found.  No results found for: NITRICOXIDE      Assessment & Plan:   No problem-specific Assessment & Plan notes found for this encounter.     Rexene Edison, NP 08/13/2020

## 2020-08-14 DIAGNOSIS — I509 Heart failure, unspecified: Secondary | ICD-10-CM | POA: Insufficient documentation

## 2020-08-14 DIAGNOSIS — F331 Major depressive disorder, recurrent, moderate: Secondary | ICD-10-CM | POA: Diagnosis not present

## 2020-08-14 DIAGNOSIS — F411 Generalized anxiety disorder: Secondary | ICD-10-CM | POA: Diagnosis not present

## 2020-08-14 NOTE — Assessment & Plan Note (Signed)
Provoked PE and DVT status post hip fracture and immobility. Will need to continue anticoagulation for at least 3 months. Will check venous Doppler prior to discontinuation. Patient appears to be doing well. Considering patient is 6. Is on hormone replacement will discontinue Premarin. Patient advised to discuss with primary care provider as feel that hormone replacement risk outweighs benefit.  Also discussed with patient and family member that she is taking high-dose nonsteroidals along with anticoagulation which can increase her risk for bleeding. Have suggested that she discuss with orthopedics to see if this can be decreased or preferably discontinued. She has a follow-up with primary care needs to have follow-up of her CBC as she was anemic during hospitalization.  Plan  Patient Instructions  Continue on Eliquis.  Discuss with Dr. Marcelino Scot regarding advil usage, use with caution as on Eliquis .  Stop Premarin .  Venous doppler in 3 months .  Refer to cardiology  Follow up with Dr. Carlis Abbott in 3 months and As needed

## 2020-08-14 NOTE — Assessment & Plan Note (Signed)
Systolic and diastolic congestive heart failure with notable drop in EF on most recent echo Suspect secondary to clot burden from recent acute PE. Patient does not appear to be volume overloaded on exam. Has not been evaluated by cardiology. Considering patient was very independent prior to hip fracture and seems to be having slow but improving recovery will refer to cardiology to see if anything further is indicated.  Plan  Patient Instructions  Continue on Eliquis.  Discuss with Dr. Marcelino Scot regarding advil usage, use with caution as on Eliquis .  Stop Premarin .  Venous doppler in 3 months .  Refer to cardiology  Follow up with Dr. Carlis Abbott in 3 months and As needed

## 2020-08-21 DIAGNOSIS — F0390 Unspecified dementia without behavioral disturbance: Secondary | ICD-10-CM | POA: Diagnosis not present

## 2020-08-21 DIAGNOSIS — F419 Anxiety disorder, unspecified: Secondary | ICD-10-CM | POA: Diagnosis not present

## 2020-08-21 DIAGNOSIS — F329 Major depressive disorder, single episode, unspecified: Secondary | ICD-10-CM | POA: Diagnosis not present

## 2020-08-21 DIAGNOSIS — R627 Adult failure to thrive: Secondary | ICD-10-CM | POA: Diagnosis not present

## 2020-08-21 DIAGNOSIS — E785 Hyperlipidemia, unspecified: Secondary | ICD-10-CM | POA: Diagnosis not present

## 2020-08-22 DIAGNOSIS — S32401D Unspecified fracture of right acetabulum, subsequent encounter for fracture with routine healing: Secondary | ICD-10-CM | POA: Diagnosis not present

## 2020-09-03 DIAGNOSIS — F419 Anxiety disorder, unspecified: Secondary | ICD-10-CM | POA: Diagnosis not present

## 2020-09-03 DIAGNOSIS — E785 Hyperlipidemia, unspecified: Secondary | ICD-10-CM | POA: Diagnosis not present

## 2020-09-03 DIAGNOSIS — R627 Adult failure to thrive: Secondary | ICD-10-CM | POA: Diagnosis not present

## 2020-09-03 DIAGNOSIS — F0390 Unspecified dementia without behavioral disturbance: Secondary | ICD-10-CM | POA: Diagnosis not present

## 2020-09-03 DIAGNOSIS — S32401D Unspecified fracture of right acetabulum, subsequent encounter for fracture with routine healing: Secondary | ICD-10-CM | POA: Diagnosis not present

## 2020-09-03 NOTE — Progress Notes (Signed)
Cardiology Office Note   Date:  09/05/2020   ID:  Cindy [name], DOB Apr 03, 1930, MRN [id number]  PCP:  Josetta Huddle, MD  Cardiologist:   Cindy Frayne Martinique, MD   Chief Complaint  Patient presents with  . Congestive Heart Failure      History of Present Illness: Cindy [name] is a 86 y.o. female who is seen at the request of Dr Cindy [name] for evaluation of new LV dysfunction.  She has a history of HTN and HLD. She was seen in 2018 by Dr Irish Lack. She had an elevated troponin level in setting of sepsis felt to be due to demand ischemia. Echo was normal at that time. She was admitted in July 2021 with a mechanical fall. Suffered an acetabular fracture. Based on patient's advanced age, poor bone quality and limited ability to achieve and maintain anatomic reduction, nonoperative management is recommended. She had AKI with creatinine up to 2.4 that improved to 1.29.  Given poor prognosis based on age, dementia and bedbound status she was referred to Palliative care. She was readmitted 8/7-07/30/20 with acute PE. CTA chest in the ER and was found to have bilateral pulmonary emboli involving the right middle and lobar arteries as well as the right and left upper lobes (segmental branches). She underwent echo on admission as well which revealed EF 30 to 35%, no RWMA, grade 2 diastolic dysfunction, moderately elevated PA pressure.  RV size normal, normal RV systolic function.  No obvious heart strain. LE duplex also showed age indeterminate deep vein thrombosis involving a single right peroneal vein. She was started on heparin and then transitioned to Eliquis. Troponin was elevated to 5200 and BNP to 970.   On follow up today she is seen with her son. She complains of nausea that has been persistent even before her hospital stay. She denies any chest pain, dyspnea, edema or palpiations. She is still non weight bearing on her right leg. Seen in a wheelchair.     Past Medical History:  Diagnosis Date   . Arthritis   . Cancer ([place])    skin  . Colon cancer ([place]) 08/31/13   invasive squamous cell  . Deaf, left   . Depression   . GERD (gastroesophageal reflux disease)   . History of radiation therapy 10/10/13-11/23/13   anal canal 54GY/  . Hyperlipemia   . Hypertension   . Hypothyroidism   . PONV (postoperative nausea and vomiting)   . Skin cancer    Shingles 2012 Bad case    Past Surgical History:  Procedure Laterality Date  . ABDOMINAL HYSTERECTOMY    . APPENDECTOMY    . CESAREAN SECTION     2  . CHOLECYSTECTOMY    . COLONOSCOPY  08/31/13   invasive squamous cell colon  . ERCP    . ESOPHAGOGASTRODUODENOSCOPY  10/20/2012   Procedure: ESOPHAGOGASTRODUODENOSCOPY (EGD);  Surgeon: Arta Silence, MD;  Location: [facility];  Service: Endoscopy;  Laterality: Left;  . EYE SURGERY     cataracts  . KNEE ARTHROSCOPY  05/11/2012   Procedure: ARTHROSCOPY KNEE;  Surgeon: Hessie Dibble, MD;  Location: [facility];  Service: Orthopedics;  Laterality: Right;  left knee medial menisectomy and chondroplasty  . THYROIDECTOMY, PARTIAL    . TONSILLECTOMY       Current Outpatient Medications  Medication Sig Dispense Refill  . acetaminophen (TYLENOL) 325 MG tablet Take 650 mg by mouth every 6 (six) hours as needed for moderate pain.    Marland Kitchen  ALPRAZolam (XANAX) 0.25 MG tablet Take 1 tablet (0.25 mg total) by mouth at bedtime as needed for anxiety or sleep. 30 tablet 0  . apixaban (ELIQUIS) 5 MG TABS tablet Take 1 tablet (5 mg total) by mouth 2 (two) times daily. Starting 8/15 after completes 10 mg BID ending 8/14 60 tablet   . buPROPion (WELLBUTRIN SR) 200 MG 12 hr tablet Take 200 mg by mouth daily.     Marland Kitchen docusate sodium (COLACE) 100 MG capsule Take 100 mg by mouth 2 (two) times daily.    Marland Kitchen escitalopram (LEXAPRO) 10 MG tablet Take 10 mg by mouth daily.    Marland Kitchen HYDROmorphone (DILAUDID) 2 MG tablet Take 1 tablet (2 mg total) by mouth every 3 (three) hours as needed for severe pain. 30  tablet 0  . ibuprofen (ADVIL) 200 MG tablet Take 200 mg by mouth every 6 (six) hours as needed.    Marland Kitchen levothyroxine (SYNTHROID, LEVOTHROID) 88 MCG tablet Take 88 mcg by mouth daily before breakfast.    . methocarbamol (ROBAXIN) 500 MG tablet Take 500 mg by mouth 3 (three) times daily.    . ondansetron (ZOFRAN ODT) 4 MG disintegrating tablet Take 1 tablet (4 mg total) by mouth every 8 (eight) hours as needed for nausea or vomiting. 10 tablet 0  . pantoprazole (PROTONIX) 40 MG tablet Take 40 mg by mouth daily.    . polyethylene glycol (MIRALAX / GLYCOLAX) 17 g packet Take 17 g by mouth daily.    Marland Kitchen PREMARIN 0.3 MG tablet Take 0.3 mg by mouth daily.    . traMADol (ULTRAM) 50 MG tablet Take 50 mg by mouth every 6 (six) hours as needed.    Marland Kitchen apixaban (ELIQUIS) 5 MG TABS tablet Take 2 tablets (10 mg total) by mouth 2 (two) times daily for 5 days. 60 tablet   . carvedilol (COREG) 6.25 MG tablet Take 1 tablet (6.25 mg total) by mouth 2 (two) times daily. 180 tablet 3  . losartan (COZAAR) 25 MG tablet Take 1 tablet (25 mg total) by mouth daily. 90 tablet 3   No current facility-administered medications for this visit.    Allergies:   Vancomycin, Zosyn [piperacillin sod-tazobactam so], Hydrocodone, and Morphine and related    Social History:  The patient  reports that she has never smoked. She has never used smokeless tobacco. She reports that she does not drink alcohol and does not use drugs.   Family History:  The patient's family history includes Cancer in her daughter, sister, sister, and sister; Heart attack in her father; Heart disease in her father; Kidney disease in her mother.    ROS:  Please see the history of present illness.   Otherwise, review of systems are positive for none.   All other systems are reviewed and negative.    PHYSICAL EXAM: VS:  BP 128/73   Pulse 82   Temp (!) 96.4 F (35.8 C)   SpO2 98%  , BMI There is no height or weight on file to calculate BMI. GEN: Elderly WF,  in no acute distress  HEENT: normal  Neck: no JVD, carotid bruits, or masses Cardiac: RRR; no murmurs, rubs, or gallops,no edema  Respiratory:  clear to auscultation bilaterally, normal work of breathing GI: soft, nontender, nondistended, + BS MS: no deformity or atrophy  Skin: warm and dry, no rash Neuro:  Strength and sensation are intact Psych: euthymic mood, full affect   Recent Labs: 07/28/2020: ALT 37; B Natriuretic Peptide 969.7 07/30/2020: BUN  18; Creatinine, Ser 1.00; Hemoglobin 8.1; Magnesium 2.3; Platelets 342; Potassium 4.1; Sodium 127    Lipid Panel No results found for: CHOL, TRIG, HDL, CHOLHDL, VLDL, LDLCALC, LDLDIRECT   Dated 08/02/20: glucose 102, albumin 3.3. otherwise CMET normal.Hgb 8.1. B12, folate, ferritin normal.  Repeat Hgb on 8/23 was 9.9  Wt Readings from Last 3 Encounters:  07/28/20 154 lb 15.7 oz (70.3 kg)  07/18/20 129 lb (58.5 kg)  07/07/20 130 lb (59 kg)    Ecg dated 07/28/20 showed NSR with old anterior infarct. T wave inversion 1 and Avl. I have personally reviewed and interpreted this study.   Other studies Reviewed: Additional studies/ records that were reviewed today include:  Echo 04/28/17: Study Conclusions   - Left ventricle: The cavity size was normal. Systolic function was  normal. The estimated ejection fraction was in the range of 55%  to 60%. Wall motion was normal; there were no regional wall  motion abnormalities. Doppler parameters are consistent with  abnormal left ventricular relaxation (grade 1 diastolic  dysfunction). Doppler parameters are consistent with elevated  mean left atrial filling pressure.  - Aortic valve: There was trivial regurgitation.  - Mitral valve: There was mild regurgitation.  - Left atrium: The atrium was mildly dilated.   Echo 07/28/20: IMPRESSIONS    1. Left ventricular ejection fraction, by estimation, is 30-35%. The left  ventricle has mildly decreased function. The left ventricle has no   regional wall motion abnormalities. Left ventricular diastolic parameters  are consistent with Grade II diastolic  dysfunction (pseudonormalization). Elevated left ventricular  end-diastolic pressure. There is possible akinesis of the left  ventricular, apical septal wall, apical segment, anterior wall and  inferolateral wall. There is akinesis of the left ventricular,  mid anteroseptal wall.  2. Right ventricular systolic function is normal. The right ventricular  size is normal. There is moderately elevated pulmonary artery systolic  pressure. The estimated right ventricular systolic pressure is 82.4 mmHg.  3. The mitral valve is normal in structure. Mild mitral valve  regurgitation. No evidence of mitral stenosis.  4. The aortic valve is normal in structure. Aortic valve regurgitation is  not visualized. Mild to moderate aortic valve sclerosis/calcification is  present, without any evidence of aortic stenosis.  5. The inferior vena cava is normal in size with greater than 50%  respiratory variability, suggesting right atrial pressure of 3 mmHg.  6. Definity contrast study has been ordered to confirm wall motion  abnormalities.    ASSESSMENT AND PLAN:  1.  Acute systolic CHF. EF dropped to 30-35% on recent hospital stay. Troponin up to 5000. BNP elevated. New EF drop since 2018. This could be ischemic mediated or stress mediated cardiomyopathy. Doubt related solely to PE. Recommend she stop taking amlodipine. Will start Coreg 6.25 mg bid. Start losartan 25 mg daily. Check BMET in 2 weeks. Will also check lipid status. Should probably be on a statin. Given her age and co-morbidities she is not a candidate for invasive evaluation. Will have her return in 4-6 weeks and titrate meds as tolerated. Repeat Echo in 3 months. 2. HTN. Medication changes as noted.  3. Acetabular fracture 4. Recent pulmonary embolus. On eliquis. 5. AKI in hospital - resolved.    Current medicines are  reviewed at length with the patient today.  The patient does not have concerns regarding medicines.  The following changes have been made:  See above  Labs/ tests ordered today include: BMET and lipid panel in 2 weeks.  Disposition:   FU with APP in 4 weeks  Signed, Madailein Londo Martinique, MD  09/05/2020 10:36 AM    [facility] [address], [place], [place], [address] Phone [phone], Fax [phone]

## 2020-09-05 ENCOUNTER — Encounter: Payer: Self-pay | Admitting: Cardiology

## 2020-09-05 ENCOUNTER — Ambulatory Visit (INDEPENDENT_AMBULATORY_CARE_PROVIDER_SITE_OTHER): Payer: Medicare Other | Admitting: Cardiology

## 2020-09-05 ENCOUNTER — Telehealth: Payer: Self-pay | Admitting: Cardiology

## 2020-09-05 ENCOUNTER — Other Ambulatory Visit: Payer: Self-pay

## 2020-09-05 VITALS — BP 128/73 | HR 82 | Temp 96.4°F

## 2020-09-05 DIAGNOSIS — I5021 Acute systolic (congestive) heart failure: Secondary | ICD-10-CM | POA: Diagnosis not present

## 2020-09-05 DIAGNOSIS — I10 Essential (primary) hypertension: Secondary | ICD-10-CM | POA: Diagnosis not present

## 2020-09-05 DIAGNOSIS — I2699 Other pulmonary embolism without acute cor pulmonale: Secondary | ICD-10-CM

## 2020-09-05 MED ORDER — CARVEDILOL 6.25 MG PO TABS
6.2500 mg | ORAL_TABLET | Freq: Two times a day (BID) | ORAL | 3 refills | Status: DC
Start: 2020-09-05 — End: 2023-03-30

## 2020-09-05 MED ORDER — LOSARTAN POTASSIUM 25 MG PO TABS
25.0000 mg | ORAL_TABLET | Freq: Every day | ORAL | 3 refills | Status: DC
Start: 2020-09-05 — End: 2020-12-03

## 2020-09-05 NOTE — Telephone Encounter (Signed)
Juliann Pulse with [facility] is requesting to have patient's lab work completed at their facility. She states it may be more convenient for them to complete the lab work then send Dr. Martinique copies. Please advise.

## 2020-09-05 NOTE — Telephone Encounter (Signed)
Spoke to [place], lab orders faxed to [facility].  Fax # [phone]

## 2020-09-05 NOTE — Patient Instructions (Signed)
Stop taking amlodipine  Start Carvedilol 6.25 mg twice a day  Start losartan 25 mg daily  We will check lab work in 2 weeks including chemistries and a lipid panel  Follow up in 4-6 weeks.

## 2020-09-10 DIAGNOSIS — R630 Anorexia: Secondary | ICD-10-CM | POA: Diagnosis not present

## 2020-09-10 DIAGNOSIS — J9601 Acute respiratory failure with hypoxia: Secondary | ICD-10-CM | POA: Diagnosis not present

## 2020-09-10 DIAGNOSIS — I2699 Other pulmonary embolism without acute cor pulmonale: Secondary | ICD-10-CM | POA: Diagnosis not present

## 2020-09-10 DIAGNOSIS — I82409 Acute embolism and thrombosis of unspecified deep veins of unspecified lower extremity: Secondary | ICD-10-CM | POA: Diagnosis not present

## 2020-09-18 DIAGNOSIS — F419 Anxiety disorder, unspecified: Secondary | ICD-10-CM | POA: Diagnosis not present

## 2020-09-18 DIAGNOSIS — F329 Major depressive disorder, single episode, unspecified: Secondary | ICD-10-CM | POA: Diagnosis not present

## 2020-09-18 DIAGNOSIS — F331 Major depressive disorder, recurrent, moderate: Secondary | ICD-10-CM | POA: Diagnosis not present

## 2020-09-18 DIAGNOSIS — R531 Weakness: Secondary | ICD-10-CM | POA: Diagnosis not present

## 2020-09-18 DIAGNOSIS — R52 Pain, unspecified: Secondary | ICD-10-CM | POA: Diagnosis not present

## 2020-09-18 DIAGNOSIS — F0390 Unspecified dementia without behavioral disturbance: Secondary | ICD-10-CM | POA: Diagnosis not present

## 2020-09-18 DIAGNOSIS — F411 Generalized anxiety disorder: Secondary | ICD-10-CM | POA: Diagnosis not present

## 2020-09-19 DIAGNOSIS — S32401D Unspecified fracture of right acetabulum, subsequent encounter for fracture with routine healing: Secondary | ICD-10-CM | POA: Diagnosis not present

## 2020-09-27 DIAGNOSIS — M25521 Pain in right elbow: Secondary | ICD-10-CM | POA: Diagnosis not present

## 2020-10-01 DIAGNOSIS — I10 Essential (primary) hypertension: Secondary | ICD-10-CM | POA: Diagnosis not present

## 2020-10-01 DIAGNOSIS — K5900 Constipation, unspecified: Secondary | ICD-10-CM | POA: Diagnosis not present

## 2020-10-01 DIAGNOSIS — R443 Hallucinations, unspecified: Secondary | ICD-10-CM | POA: Diagnosis not present

## 2020-10-01 DIAGNOSIS — R4182 Altered mental status, unspecified: Secondary | ICD-10-CM | POA: Diagnosis not present

## 2020-10-03 DIAGNOSIS — I10 Essential (primary) hypertension: Secondary | ICD-10-CM | POA: Diagnosis not present

## 2020-10-03 DIAGNOSIS — R4182 Altered mental status, unspecified: Secondary | ICD-10-CM | POA: Diagnosis not present

## 2020-10-03 DIAGNOSIS — R443 Hallucinations, unspecified: Secondary | ICD-10-CM | POA: Diagnosis not present

## 2020-10-03 DIAGNOSIS — K5900 Constipation, unspecified: Secondary | ICD-10-CM | POA: Diagnosis not present

## 2020-10-04 DIAGNOSIS — R4182 Altered mental status, unspecified: Secondary | ICD-10-CM | POA: Diagnosis not present

## 2020-10-04 DIAGNOSIS — E876 Hypokalemia: Secondary | ICD-10-CM | POA: Diagnosis not present

## 2020-10-04 DIAGNOSIS — I2699 Other pulmonary embolism without acute cor pulmonale: Secondary | ICD-10-CM | POA: Diagnosis not present

## 2020-10-04 DIAGNOSIS — E039 Hypothyroidism, unspecified: Secondary | ICD-10-CM | POA: Diagnosis not present

## 2020-10-08 ENCOUNTER — Ambulatory Visit: Payer: Medicare Other | Admitting: Physician Assistant

## 2020-10-11 DIAGNOSIS — S32401D Unspecified fracture of right acetabulum, subsequent encounter for fracture with routine healing: Secondary | ICD-10-CM | POA: Diagnosis not present

## 2020-10-11 DIAGNOSIS — Z741 Need for assistance with personal care: Secondary | ICD-10-CM | POA: Diagnosis not present

## 2020-10-11 DIAGNOSIS — M1990 Unspecified osteoarthritis, unspecified site: Secondary | ICD-10-CM | POA: Diagnosis not present

## 2020-10-11 DIAGNOSIS — M25551 Pain in right hip: Secondary | ICD-10-CM | POA: Diagnosis not present

## 2020-10-11 DIAGNOSIS — R2681 Unsteadiness on feet: Secondary | ICD-10-CM | POA: Diagnosis not present

## 2020-10-11 DIAGNOSIS — R262 Difficulty in walking, not elsewhere classified: Secondary | ICD-10-CM | POA: Diagnosis not present

## 2020-10-11 DIAGNOSIS — M6281 Muscle weakness (generalized): Secondary | ICD-10-CM | POA: Diagnosis not present

## 2020-10-12 DIAGNOSIS — R262 Difficulty in walking, not elsewhere classified: Secondary | ICD-10-CM | POA: Diagnosis not present

## 2020-10-12 DIAGNOSIS — Z741 Need for assistance with personal care: Secondary | ICD-10-CM | POA: Diagnosis not present

## 2020-10-12 DIAGNOSIS — R2681 Unsteadiness on feet: Secondary | ICD-10-CM | POA: Diagnosis not present

## 2020-10-12 DIAGNOSIS — M1990 Unspecified osteoarthritis, unspecified site: Secondary | ICD-10-CM | POA: Diagnosis not present

## 2020-10-12 DIAGNOSIS — M25551 Pain in right hip: Secondary | ICD-10-CM | POA: Diagnosis not present

## 2020-10-12 DIAGNOSIS — M6281 Muscle weakness (generalized): Secondary | ICD-10-CM | POA: Diagnosis not present

## 2020-10-13 DIAGNOSIS — S32401S Unspecified fracture of right acetabulum, sequela: Secondary | ICD-10-CM | POA: Diagnosis not present

## 2020-10-13 DIAGNOSIS — Z7189 Other specified counseling: Secondary | ICD-10-CM | POA: Diagnosis not present

## 2020-10-13 DIAGNOSIS — K5909 Other constipation: Secondary | ICD-10-CM | POA: Diagnosis not present

## 2020-10-13 DIAGNOSIS — F410 Panic disorder [episodic paroxysmal anxiety] without agoraphobia: Secondary | ICD-10-CM | POA: Diagnosis not present

## 2020-10-13 DIAGNOSIS — F341 Dysthymic disorder: Secondary | ICD-10-CM | POA: Diagnosis not present

## 2020-10-13 DIAGNOSIS — F419 Anxiety disorder, unspecified: Secondary | ICD-10-CM | POA: Diagnosis not present

## 2020-10-13 DIAGNOSIS — Z0000 Encounter for general adult medical examination without abnormal findings: Secondary | ICD-10-CM | POA: Diagnosis not present

## 2020-10-13 DIAGNOSIS — E785 Hyperlipidemia, unspecified: Secondary | ICD-10-CM | POA: Diagnosis not present

## 2020-10-13 DIAGNOSIS — I2699 Other pulmonary embolism without acute cor pulmonale: Secondary | ICD-10-CM | POA: Diagnosis not present

## 2020-10-13 DIAGNOSIS — I5022 Chronic systolic (congestive) heart failure: Secondary | ICD-10-CM | POA: Diagnosis not present

## 2020-10-15 DIAGNOSIS — Z741 Need for assistance with personal care: Secondary | ICD-10-CM | POA: Diagnosis not present

## 2020-10-15 DIAGNOSIS — M1990 Unspecified osteoarthritis, unspecified site: Secondary | ICD-10-CM | POA: Diagnosis not present

## 2020-10-15 DIAGNOSIS — Z7189 Other specified counseling: Secondary | ICD-10-CM | POA: Diagnosis not present

## 2020-10-15 DIAGNOSIS — M25551 Pain in right hip: Secondary | ICD-10-CM | POA: Diagnosis not present

## 2020-10-15 DIAGNOSIS — R262 Difficulty in walking, not elsewhere classified: Secondary | ICD-10-CM | POA: Diagnosis not present

## 2020-10-15 DIAGNOSIS — M6281 Muscle weakness (generalized): Secondary | ICD-10-CM | POA: Diagnosis not present

## 2020-10-15 DIAGNOSIS — I5022 Chronic systolic (congestive) heart failure: Secondary | ICD-10-CM | POA: Diagnosis not present

## 2020-10-15 DIAGNOSIS — I2699 Other pulmonary embolism without acute cor pulmonale: Secondary | ICD-10-CM | POA: Diagnosis not present

## 2020-10-15 DIAGNOSIS — R2681 Unsteadiness on feet: Secondary | ICD-10-CM | POA: Diagnosis not present

## 2020-10-16 DIAGNOSIS — M25551 Pain in right hip: Secondary | ICD-10-CM | POA: Diagnosis not present

## 2020-10-16 DIAGNOSIS — M6281 Muscle weakness (generalized): Secondary | ICD-10-CM | POA: Diagnosis not present

## 2020-10-16 DIAGNOSIS — Z741 Need for assistance with personal care: Secondary | ICD-10-CM | POA: Diagnosis not present

## 2020-10-16 DIAGNOSIS — R2681 Unsteadiness on feet: Secondary | ICD-10-CM | POA: Diagnosis not present

## 2020-10-16 DIAGNOSIS — R262 Difficulty in walking, not elsewhere classified: Secondary | ICD-10-CM | POA: Diagnosis not present

## 2020-10-16 DIAGNOSIS — M1990 Unspecified osteoarthritis, unspecified site: Secondary | ICD-10-CM | POA: Diagnosis not present

## 2020-10-17 DIAGNOSIS — M25551 Pain in right hip: Secondary | ICD-10-CM | POA: Diagnosis not present

## 2020-10-17 DIAGNOSIS — M1990 Unspecified osteoarthritis, unspecified site: Secondary | ICD-10-CM | POA: Diagnosis not present

## 2020-10-17 DIAGNOSIS — Z741 Need for assistance with personal care: Secondary | ICD-10-CM | POA: Diagnosis not present

## 2020-10-17 DIAGNOSIS — R262 Difficulty in walking, not elsewhere classified: Secondary | ICD-10-CM | POA: Diagnosis not present

## 2020-10-17 DIAGNOSIS — R2681 Unsteadiness on feet: Secondary | ICD-10-CM | POA: Diagnosis not present

## 2020-10-17 DIAGNOSIS — S32401K Unspecified fracture of right acetabulum, subsequent encounter for fracture with nonunion: Secondary | ICD-10-CM | POA: Diagnosis not present

## 2020-10-17 DIAGNOSIS — M6281 Muscle weakness (generalized): Secondary | ICD-10-CM | POA: Diagnosis not present

## 2020-10-18 DIAGNOSIS — M25551 Pain in right hip: Secondary | ICD-10-CM | POA: Diagnosis not present

## 2020-10-18 DIAGNOSIS — R2681 Unsteadiness on feet: Secondary | ICD-10-CM | POA: Diagnosis not present

## 2020-10-18 DIAGNOSIS — Z741 Need for assistance with personal care: Secondary | ICD-10-CM | POA: Diagnosis not present

## 2020-10-18 DIAGNOSIS — M6281 Muscle weakness (generalized): Secondary | ICD-10-CM | POA: Diagnosis not present

## 2020-10-18 DIAGNOSIS — R262 Difficulty in walking, not elsewhere classified: Secondary | ICD-10-CM | POA: Diagnosis not present

## 2020-10-18 DIAGNOSIS — M1990 Unspecified osteoarthritis, unspecified site: Secondary | ICD-10-CM | POA: Diagnosis not present

## 2020-10-22 DIAGNOSIS — M25551 Pain in right hip: Secondary | ICD-10-CM | POA: Diagnosis not present

## 2020-10-22 DIAGNOSIS — Z741 Need for assistance with personal care: Secondary | ICD-10-CM | POA: Diagnosis not present

## 2020-10-22 DIAGNOSIS — S32401D Unspecified fracture of right acetabulum, subsequent encounter for fracture with routine healing: Secondary | ICD-10-CM | POA: Diagnosis not present

## 2020-10-22 DIAGNOSIS — M1990 Unspecified osteoarthritis, unspecified site: Secondary | ICD-10-CM | POA: Diagnosis not present

## 2020-10-22 DIAGNOSIS — M6281 Muscle weakness (generalized): Secondary | ICD-10-CM | POA: Diagnosis not present

## 2020-10-22 DIAGNOSIS — R2681 Unsteadiness on feet: Secondary | ICD-10-CM | POA: Diagnosis not present

## 2020-10-22 DIAGNOSIS — R262 Difficulty in walking, not elsewhere classified: Secondary | ICD-10-CM | POA: Diagnosis not present

## 2020-10-23 DIAGNOSIS — Z741 Need for assistance with personal care: Secondary | ICD-10-CM | POA: Diagnosis not present

## 2020-10-23 DIAGNOSIS — M6281 Muscle weakness (generalized): Secondary | ICD-10-CM | POA: Diagnosis not present

## 2020-10-23 DIAGNOSIS — R2681 Unsteadiness on feet: Secondary | ICD-10-CM | POA: Diagnosis not present

## 2020-10-23 DIAGNOSIS — M1990 Unspecified osteoarthritis, unspecified site: Secondary | ICD-10-CM | POA: Diagnosis not present

## 2020-10-23 DIAGNOSIS — M25551 Pain in right hip: Secondary | ICD-10-CM | POA: Diagnosis not present

## 2020-10-23 DIAGNOSIS — R262 Difficulty in walking, not elsewhere classified: Secondary | ICD-10-CM | POA: Diagnosis not present

## 2020-10-24 DIAGNOSIS — R2681 Unsteadiness on feet: Secondary | ICD-10-CM | POA: Diagnosis not present

## 2020-10-24 DIAGNOSIS — Z741 Need for assistance with personal care: Secondary | ICD-10-CM | POA: Diagnosis not present

## 2020-10-24 DIAGNOSIS — R262 Difficulty in walking, not elsewhere classified: Secondary | ICD-10-CM | POA: Diagnosis not present

## 2020-10-24 DIAGNOSIS — M6281 Muscle weakness (generalized): Secondary | ICD-10-CM | POA: Diagnosis not present

## 2020-10-24 DIAGNOSIS — M25551 Pain in right hip: Secondary | ICD-10-CM | POA: Diagnosis not present

## 2020-10-24 DIAGNOSIS — M1990 Unspecified osteoarthritis, unspecified site: Secondary | ICD-10-CM | POA: Diagnosis not present

## 2020-10-25 DIAGNOSIS — R2681 Unsteadiness on feet: Secondary | ICD-10-CM | POA: Diagnosis not present

## 2020-10-25 DIAGNOSIS — M25551 Pain in right hip: Secondary | ICD-10-CM | POA: Diagnosis not present

## 2020-10-25 DIAGNOSIS — M1990 Unspecified osteoarthritis, unspecified site: Secondary | ICD-10-CM | POA: Diagnosis not present

## 2020-10-25 DIAGNOSIS — Z741 Need for assistance with personal care: Secondary | ICD-10-CM | POA: Diagnosis not present

## 2020-10-25 DIAGNOSIS — M6281 Muscle weakness (generalized): Secondary | ICD-10-CM | POA: Diagnosis not present

## 2020-10-25 DIAGNOSIS — R262 Difficulty in walking, not elsewhere classified: Secondary | ICD-10-CM | POA: Diagnosis not present

## 2020-10-26 ENCOUNTER — Emergency Department (HOSPITAL_COMMUNITY)
Admission: EM | Admit: 2020-10-26 | Discharge: 2020-10-26 | Disposition: A | Discharge status: Home / Self Care | Payer: Medicare Other | Attending: Emergency Medicine | Admitting: Emergency Medicine

## 2020-10-26 ENCOUNTER — Emergency Department (HOSPITAL_COMMUNITY): Payer: Medicare Other

## 2020-10-26 ENCOUNTER — Encounter (HOSPITAL_COMMUNITY): Payer: Self-pay | Admitting: Emergency Medicine

## 2020-10-26 DIAGNOSIS — Z85048 Personal history of other malignant neoplasm of rectum, rectosigmoid junction, and anus: Secondary | ICD-10-CM | POA: Insufficient documentation

## 2020-10-26 DIAGNOSIS — M25551 Pain in right hip: Secondary | ICD-10-CM | POA: Diagnosis not present

## 2020-10-26 DIAGNOSIS — Z85828 Personal history of other malignant neoplasm of skin: Secondary | ICD-10-CM | POA: Insufficient documentation

## 2020-10-26 DIAGNOSIS — W07XXXA Fall from chair, initial encounter: Secondary | ICD-10-CM | POA: Diagnosis not present

## 2020-10-26 DIAGNOSIS — E039 Hypothyroidism, unspecified: Secondary | ICD-10-CM | POA: Insufficient documentation

## 2020-10-26 DIAGNOSIS — I10 Essential (primary) hypertension: Secondary | ICD-10-CM | POA: Insufficient documentation

## 2020-10-26 DIAGNOSIS — Z85038 Personal history of other malignant neoplasm of large intestine: Secondary | ICD-10-CM | POA: Diagnosis not present

## 2020-10-26 DIAGNOSIS — S32401A Unspecified fracture of right acetabulum, initial encounter for closed fracture: Secondary | ICD-10-CM | POA: Diagnosis not present

## 2020-10-26 DIAGNOSIS — Z79899 Other long term (current) drug therapy: Secondary | ICD-10-CM | POA: Insufficient documentation

## 2020-10-26 DIAGNOSIS — Z7901 Long term (current) use of anticoagulants: Secondary | ICD-10-CM | POA: Diagnosis not present

## 2020-10-26 DIAGNOSIS — R52 Pain, unspecified: Secondary | ICD-10-CM | POA: Diagnosis not present

## 2020-10-26 DIAGNOSIS — W19XXXA Unspecified fall, initial encounter: Secondary | ICD-10-CM | POA: Diagnosis not present

## 2020-10-26 DIAGNOSIS — M545 Low back pain, unspecified: Secondary | ICD-10-CM | POA: Diagnosis not present

## 2020-10-26 DIAGNOSIS — M25572 Pain in left ankle and joints of left foot: Secondary | ICD-10-CM | POA: Diagnosis not present

## 2020-10-26 DIAGNOSIS — S79911A Unspecified injury of right hip, initial encounter: Secondary | ICD-10-CM | POA: Diagnosis present

## 2020-10-26 MED ORDER — OXYCODONE-ACETAMINOPHEN 5-325 MG PO TABS: 1.0000 | ORAL_TABLET | Freq: Once | ORAL | Status: DC

## 2020-10-26 NOTE — ED Notes (Signed)
Pt visitor took patient out when wheelchair arrived. She was offered help, but stated that "she didn't need any." She was upset about waiting. She refused to wait to allow staff to perform discharge vital signs. She also did not sign for discharge. She did receive paperwork.

## 2020-10-26 NOTE — ED Notes (Signed)
[facility] called for transport back to [facility].  °

## 2020-10-26 NOTE — Discharge Instructions (Signed)
We recommend you change from being weight bearing as tolerated to [facility] for your acetabular fracture given progression and erosion of fracture and recommend follow up with Dr. Marcelino Scot.

## 2020-10-26 NOTE — ED Triage Notes (Addendum)
BIB EMS. Pt fell around noon, now having right sided hip pain that radiates down right leg. Pt is on blood thinners. Denies LOC, N/V. Hx of right hip fracture on 07/07/20.   Pt states she was trying to transfer from the bed to her chair, missed the chair, and fell to the floor. Pt's been in assistive living since her last fall in July.

## 2020-10-26 NOTE — Progress Notes (Signed)
Called by emergency room physician regarding right chronic acetabular fracture.  Patient had a fairly low energy recurrent fall, difficulty with ambulation, she was able to walk as much as 11 steps yesterday, but has had more pain currently.  I reviewed her x-rays, which did not demonstrate any new fractures, but the unstable acetabular pattern and progressive wear of the femoral head as well as the acetabulum is extremely concerning.  This is an extremely difficult situation, and may not have a good structural solution, I will plan to review and discuss with Dr. Marcelino Scot, I am not sure what the long-term plan will be, but she can go back to her care facility, she may benefit from touch toe weightbearing at least in the short-term until she can somehow stabilize her fracture pattern.  She may not be able to survive surgical intervention, but her structure looks almost not salvageable.  Johnny Bridge, MD

## 2020-10-26 NOTE — ED Provider Notes (Signed)
[facility] DEPT Provider Note   CSN: [id number] Arrival date & time: 10/26/20  1701     History Chief Complaint  Patient presents with  . Fall  . Hip Pain    Cindy [name] is a 86 y.o. female.  HPI      86yo female with history of hypertension, hyperlipidemia, hypothyroidism, colon cancer, CHF, DVT and PE on eliquis who presents with concern for fall at [facility] assisted living.   She was trying to transfer from the bed to the chair and missed the chair sliding to the floor.  She has had increased pain that she notes just with movement, and feels the pain is well controlled when she is resting. Denies head trauma, headache, neck pain> Does note some lower back pain. No new numbness/weakness, Notes some pain from right hip down towards right knee.  Denies acute medical concerns. No LOC, no numbness/weakness/cough/fever/cp/dyspnea.   Yesterday she was able to walk 11 steps with PT at [facility]  Past Medical History:  Diagnosis Date  . Arthritis   . Cancer ([place])    skin  . Colon cancer ([place]) 08/31/13   invasive squamous cell  . Deaf, left   . Depression   . GERD (gastroesophageal reflux disease)   . History of radiation therapy 10/10/13-11/23/13   anal canal 54GY/  . Hyperlipemia   . Hypertension   . Hypothyroidism   . PONV (postoperative nausea and vomiting)   . Skin cancer    Shingles 2012 Bad case    Patient Active Problem List   Diagnosis Date Noted  . CHF (congestive heart failure) ([place]) 08/14/2020  . DVT (deep venous thrombosis) ([place]) 07/29/2020  . Pulmonary embolus ([place]) 07/28/2020  . Anemia 07/28/2020  . Pulmonary embolism ([place]) 07/28/2020  . Acute respiratory failure with hypoxia ([place]) 07/28/2020  . Dementia without behavioral disturbance ([place])   . DNR (do not resuscitate)   . Palliative care by specialist   . Adult failure to thrive   . Acetabular fracture ([place]) 07/07/2020  . Sepsis ([place]) 03/09/2017  .  Nausea vomiting and diarrhea 03/09/2017  . Elevated troponin 03/09/2017  . Chest pain 12/25/2013  . Dyspnea 12/25/2013  . Restless leg syndrome 12/25/2013  . Mucositis 10/27/2013  . Hypophosphatemia 10/27/2013  . Protein-calorie malnutrition, severe ([place]) 10/25/2013  . Thrombocytopenia ([place]) 10/25/2013  . Hypomagnesemia 10/24/2013  . Febrile neutropenia ([place]) 10/23/2013  . Anal cancer ([place]) 09/09/2013  . Colon cancer ([place]) 08/31/2013  . Colitis, acute 03/17/2013  . Gastroparesis 10/22/2012  . Hand pain, left 10/22/2012  . Anxiety 10/18/2012  . Hyperventilation syndrome 10/18/2012  . Headache(784.0) 10/18/2012  . Neck pain on right side 10/18/2012  . Hypertension 10/18/2012  . GERD (gastroesophageal reflux disease) 10/18/2012  . Hyperlipidemia 10/18/2012  . Insomnia 10/18/2012  . Hearing loss sensory, bilateral 10/18/2012  . 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 Weakness generalized 10/17/2012  . Hyponatremia 10/17/2012  . Hypokalemia 10/17/2012  . Hypothyroidism 10/17/2012  . Fatigue 10/17/2012    Past Surgical History:  Procedure Laterality Date  . ABDOMINAL HYSTERECTOMY    . APPENDECTOMY    . CESAREAN SECTION     2  . CHOLECYSTECTOMY    . COLONOSCOPY  08/31/13   invasive squamous cell colon  . ERCP    . ESOPHAGOGASTRODUODENOSCOPY  10/20/2012   Procedure: ESOPHAGOGASTRODUODENOSCOPY (EGD);  Surgeon: Arta Silence, MD;  Location: [facility];  Service: Endoscopy;  Laterality: Left;  . EYE SURGERY  cataracts  . KNEE ARTHROSCOPY  05/11/2012   Procedure: ARTHROSCOPY KNEE;  Surgeon: Hessie Dibble, MD;  Location: [facility];  Service: Orthopedics;  Laterality: Right;  left knee medial menisectomy and chondroplasty  . THYROIDECTOMY, PARTIAL    . TONSILLECTOMY       OB History   No obstetric history on file.     Family History  Problem Relation Age of Onset  . Kidney disease Mother   . Heart attack Father   . Heart disease Father    . Cancer Sister        breast  . Cancer Sister        ovarian  . Cancer Sister        melanoma  . Cancer Daughter        breast    Social History   Tobacco Use  . Smoking status: Never Smoker  . Smokeless tobacco: Never Used  Substance Use Topics  . Alcohol use: No  . Drug use: No    Home Medications Prior to Admission medications   Medication Sig Start Date End Date Taking? Authorizing Provider  acetaminophen (TYLENOL) 325 MG tablet Take 650 mg by mouth every 6 (six) hours as needed for moderate pain.    [provider]  ALPRAZolam Duanne Moron) 0.25 MG tablet Take 1 tablet (0.25 mg total) by mouth at bedtime as needed for anxiety or sleep. 07/30/20   Dwyane Dee, MD  apixaban (ELIQUIS) 5 MG TABS tablet Take 2 tablets (10 mg total) by mouth 2 (two) times daily for 5 days. 07/30/20 08/04/20  Dwyane Dee, MD  apixaban (ELIQUIS) 5 MG TABS tablet Take 1 tablet (5 mg total) by mouth 2 (two) times daily. Starting 8/15 after completes 10 mg BID ending 8/14 08/05/20   Dwyane Dee, MD  buPROPion [facility] SR) 200 MG 12 hr tablet Take 200 mg by mouth daily.     [provider]  carvedilol (COREG) 6.25 MG tablet Take 1 tablet (6.25 mg total) by mouth 2 (two) times daily. 09/05/20   Martinique, Peter M, MD  docusate sodium (COLACE) 100 MG capsule Take 100 mg by mouth 2 (two) times daily.    [provider]  escitalopram (LEXAPRO) 10 MG tablet Take 10 mg by mouth daily.    [provider]  HYDROmorphone (DILAUDID) 2 MG tablet Take 1 tablet (2 mg total) by mouth every 3 (three) hours as needed for severe pain. 07/30/20   Dwyane Dee, MD  ibuprofen (ADVIL) 200 MG tablet Take 200 mg by mouth every 6 (six) hours as needed.    [provider]  levothyroxine (SYNTHROID, LEVOTHROID) 88 MCG tablet Take 88 mcg by mouth daily before breakfast.    [provider]  losartan (COZAAR) 25 MG tablet Take 1 tablet (25 mg total) by mouth daily. 09/05/20 08/31/21   Martinique, Peter M, MD  methocarbamol (ROBAXIN) 500 MG tablet Take 500 mg by mouth 3 (three) times daily.    [provider]  ondansetron (ZOFRAN ODT) 4 MG disintegrating tablet Take 1 tablet (4 mg total) by mouth every 8 (eight) hours as needed for nausea or vomiting. 07/06/17   Sherwood Gambler, MD  pantoprazole (PROTONIX) 40 MG tablet Take 40 mg by mouth daily.    [provider]  polyethylene glycol (MIRALAX / GLYCOLAX) 17 g packet Take 17 g by mouth daily.    [provider]  PREMARIN 0.3 MG tablet Take 0.3 mg by mouth daily. 08/14/16  [provider]  traMADol (ULTRAM) 50 MG tablet Take 50 mg by mouth every 6 (six) hours as needed.    [provider]    Allergies    Vancomycin, Zosyn [piperacillin sod-tazobactam so], Hydrocodone, and Morphine and related  Review of Systems   Review of Systems  Constitutional: Negative for fever.  Respiratory: Negative for cough and shortness of breath.   Cardiovascular: Negative for chest pain.  Gastrointestinal: Negative for abdominal pain, nausea and vomiting.  Musculoskeletal: Positive for arthralgias and back pain.  Skin: Negative for rash.  Neurological: Negative for weakness, numbness and headaches.    Physical Exam Updated Vital Signs BP (!) 181/79   Pulse (!) 50   Temp 98.4 F (36.9 C) (Oral)   Resp 16   SpO2 98%   Physical Exam Vitals and nursing note reviewed.  Constitutional:      General: She is not in acute distress.    Appearance: Normal appearance. She is not ill-appearing, toxic-appearing or diaphoretic.  HENT:     Head: Normocephalic.  Eyes:     Conjunctiva/sclera: Conjunctivae normal.  Cardiovascular:     Rate and Rhythm: Normal rate and regular rhythm.     Pulses: Normal pulses.  Pulmonary:     Effort: Pulmonary effort is normal. No respiratory distress.  Musculoskeletal:        General: Tenderness (right hip/pelvis, lumbar spine) present. No deformity (shortening of right  leg (pt notes has had shortening since July)) or signs of injury.     Cervical back: No rigidity.  Skin:    General: Skin is warm and dry.     Coloration: Skin is not jaundiced or pale.  Neurological:     General: No focal deficit present.     Mental Status: She is alert and oriented to person, place, and time.     ED Results / Procedures / Treatments   Labs (all labs ordered are listed, but only abnormal results are displayed) Labs Reviewed - No data to display  EKG None  Radiology DG Lumbar Spine Complete  Result Date: 10/26/2020 CLINICAL DATA:  Fall.  Pain. EXAM: LUMBAR SPINE - COMPLETE 4+ VIEW COMPARISON:  None. FINDINGS: Normal alignment.  Negative for lumbar fracture. Mild degenerative change L3-4 L4-5. Advanced degenerative change with disc space narrowing and spurring L5-S1. Fracture right acetabulum again noted. IMPRESSION: Negative for lumbar fracture. Electronically Signed   By: Franchot Gallo M.D.   On: 10/26/2020 19:27   DG Hip Unilat W or Wo Pelvis 2-3 Views Right  Result Date: 10/26/2020 CLINICAL DATA:  Fall.  Right hip pain EXAM: DG HIP (WITH OR WITHOUT PELVIS) 2-3V RIGHT COMPARISON:  07/07/2020 FINDINGS: Chronic fracture right acetabulum which was seen on the prior study. There is progressive displacement of the fracture. There are progressive erosive changes in the superior femoral neck and head on the right due to superomedial displacement of the right femoral head which has progressed. No new fracture identified.  Mild degenerative change left hip. IMPRESSION: Chronic fracture right acetabulum with progressive displacement of the fracture and progressive erosive changes of the superior femoral neck and head on the right. No new fracture. Electronically Signed   By: Franchot Gallo M.D.   On: 10/26/2020 19:26    Procedures Procedures (including critical care time)  Medications Ordered in ED Medications  oxyCODONE-acetaminophen (PERCOCET/ROXICET) 5-325 MG per tablet  1 tablet (has no administration in time range)    ED Course  I have reviewed the triage vital signs and  the nursing notes.  Pertinent labs & imaging results that were available during my care of the patient were reviewed by me and considered in my medical decision making (see chart for details).    MDM Rules/Calculators/A&P                          86yo female with history of hypertension, hyperlipidemia, hypothyroidism, colon cancer, CHF, DVT and PE on eliquis who presents with concern for fall at [facility] assisted living while transferring from bed to chair.  Discussed that with anticoagulation I have low threshold for CT head however given no head trauma, low mechanism of sliding to floor, no LOC, no headache/nv have low suspicion for intracranial bleed with this mechanism of fall even in setting of anticoagulation and it is reasonable to continue to monitor symptoms and patient and daughter agree.  Low suspicion for ICH, cervical spine injury, injuries to C/A.   XR lumbar spine wihtout acute findings. XR hip shows progression/erosive changes of right acetabular fracture without signs of new fracture. Discussed with Dr. Mardelle Matte who recommends transition to toe touch weight bearing from [facility] and close follow up with Dr. Marcelino Scot for continued discussion of her care.     Final Clinical Impression(s) / ED Diagnoses Final diagnoses:  Fall, initial encounter  Closed displaced fracture of right acetabulum, unspecified portion of acetabulum, initial encounter [facility])    Rx / DC Orders ED Discharge Orders    None       Gareth Morgan, MD 10/26/20 2042

## 2020-10-30 DIAGNOSIS — R2681 Unsteadiness on feet: Secondary | ICD-10-CM | POA: Diagnosis not present

## 2020-10-30 DIAGNOSIS — M1990 Unspecified osteoarthritis, unspecified site: Secondary | ICD-10-CM | POA: Diagnosis not present

## 2020-10-30 DIAGNOSIS — E785 Hyperlipidemia, unspecified: Secondary | ICD-10-CM | POA: Diagnosis not present

## 2020-10-30 DIAGNOSIS — R262 Difficulty in walking, not elsewhere classified: Secondary | ICD-10-CM | POA: Diagnosis not present

## 2020-10-30 DIAGNOSIS — E119 Type 2 diabetes mellitus without complications: Secondary | ICD-10-CM | POA: Diagnosis not present

## 2020-10-30 DIAGNOSIS — M25551 Pain in right hip: Secondary | ICD-10-CM | POA: Diagnosis not present

## 2020-10-30 DIAGNOSIS — I10 Essential (primary) hypertension: Secondary | ICD-10-CM | POA: Diagnosis not present

## 2020-10-30 DIAGNOSIS — M6281 Muscle weakness (generalized): Secondary | ICD-10-CM | POA: Diagnosis not present

## 2020-10-30 DIAGNOSIS — Z741 Need for assistance with personal care: Secondary | ICD-10-CM | POA: Diagnosis not present

## 2020-10-31 DIAGNOSIS — M1990 Unspecified osteoarthritis, unspecified site: Secondary | ICD-10-CM | POA: Diagnosis not present

## 2020-10-31 DIAGNOSIS — M6281 Muscle weakness (generalized): Secondary | ICD-10-CM | POA: Diagnosis not present

## 2020-10-31 DIAGNOSIS — M25551 Pain in right hip: Secondary | ICD-10-CM | POA: Diagnosis not present

## 2020-10-31 DIAGNOSIS — R2681 Unsteadiness on feet: Secondary | ICD-10-CM | POA: Diagnosis not present

## 2020-10-31 DIAGNOSIS — R262 Difficulty in walking, not elsewhere classified: Secondary | ICD-10-CM | POA: Diagnosis not present

## 2020-10-31 DIAGNOSIS — Z741 Need for assistance with personal care: Secondary | ICD-10-CM | POA: Diagnosis not present

## 2020-11-01 DIAGNOSIS — M6281 Muscle weakness (generalized): Secondary | ICD-10-CM | POA: Diagnosis not present

## 2020-11-01 DIAGNOSIS — R262 Difficulty in walking, not elsewhere classified: Secondary | ICD-10-CM | POA: Diagnosis not present

## 2020-11-01 DIAGNOSIS — M1990 Unspecified osteoarthritis, unspecified site: Secondary | ICD-10-CM | POA: Diagnosis not present

## 2020-11-01 DIAGNOSIS — R2681 Unsteadiness on feet: Secondary | ICD-10-CM | POA: Diagnosis not present

## 2020-11-01 DIAGNOSIS — Z741 Need for assistance with personal care: Secondary | ICD-10-CM | POA: Diagnosis not present

## 2020-11-01 DIAGNOSIS — M25551 Pain in right hip: Secondary | ICD-10-CM | POA: Diagnosis not present

## 2020-11-02 DIAGNOSIS — Z741 Need for assistance with personal care: Secondary | ICD-10-CM | POA: Diagnosis not present

## 2020-11-02 DIAGNOSIS — M25551 Pain in right hip: Secondary | ICD-10-CM | POA: Diagnosis not present

## 2020-11-02 DIAGNOSIS — R262 Difficulty in walking, not elsewhere classified: Secondary | ICD-10-CM | POA: Diagnosis not present

## 2020-11-02 DIAGNOSIS — R2681 Unsteadiness on feet: Secondary | ICD-10-CM | POA: Diagnosis not present

## 2020-11-02 DIAGNOSIS — M6281 Muscle weakness (generalized): Secondary | ICD-10-CM | POA: Diagnosis not present

## 2020-11-02 DIAGNOSIS — M1990 Unspecified osteoarthritis, unspecified site: Secondary | ICD-10-CM | POA: Diagnosis not present

## 2020-11-03 DIAGNOSIS — Z23 Encounter for immunization: Secondary | ICD-10-CM | POA: Diagnosis not present

## 2020-11-06 DIAGNOSIS — M25551 Pain in right hip: Secondary | ICD-10-CM | POA: Diagnosis not present

## 2020-11-06 DIAGNOSIS — M6281 Muscle weakness (generalized): Secondary | ICD-10-CM | POA: Diagnosis not present

## 2020-11-06 DIAGNOSIS — R262 Difficulty in walking, not elsewhere classified: Secondary | ICD-10-CM | POA: Diagnosis not present

## 2020-11-06 DIAGNOSIS — R2681 Unsteadiness on feet: Secondary | ICD-10-CM | POA: Diagnosis not present

## 2020-11-06 DIAGNOSIS — Z741 Need for assistance with personal care: Secondary | ICD-10-CM | POA: Diagnosis not present

## 2020-11-06 DIAGNOSIS — M1990 Unspecified osteoarthritis, unspecified site: Secondary | ICD-10-CM | POA: Diagnosis not present

## 2020-11-07 DIAGNOSIS — M6281 Muscle weakness (generalized): Secondary | ICD-10-CM | POA: Diagnosis not present

## 2020-11-07 DIAGNOSIS — R2681 Unsteadiness on feet: Secondary | ICD-10-CM | POA: Diagnosis not present

## 2020-11-07 DIAGNOSIS — M1990 Unspecified osteoarthritis, unspecified site: Secondary | ICD-10-CM | POA: Diagnosis not present

## 2020-11-07 DIAGNOSIS — R262 Difficulty in walking, not elsewhere classified: Secondary | ICD-10-CM | POA: Diagnosis not present

## 2020-11-07 DIAGNOSIS — Z741 Need for assistance with personal care: Secondary | ICD-10-CM | POA: Diagnosis not present

## 2020-11-07 DIAGNOSIS — M25551 Pain in right hip: Secondary | ICD-10-CM | POA: Diagnosis not present

## 2020-11-08 DIAGNOSIS — Z741 Need for assistance with personal care: Secondary | ICD-10-CM | POA: Diagnosis not present

## 2020-11-08 DIAGNOSIS — M1990 Unspecified osteoarthritis, unspecified site: Secondary | ICD-10-CM | POA: Diagnosis not present

## 2020-11-08 DIAGNOSIS — R262 Difficulty in walking, not elsewhere classified: Secondary | ICD-10-CM | POA: Diagnosis not present

## 2020-11-08 DIAGNOSIS — M6281 Muscle weakness (generalized): Secondary | ICD-10-CM | POA: Diagnosis not present

## 2020-11-08 DIAGNOSIS — M25551 Pain in right hip: Secondary | ICD-10-CM | POA: Diagnosis not present

## 2020-11-08 DIAGNOSIS — R2681 Unsteadiness on feet: Secondary | ICD-10-CM | POA: Diagnosis not present

## 2020-11-09 DIAGNOSIS — M6281 Muscle weakness (generalized): Secondary | ICD-10-CM | POA: Diagnosis not present

## 2020-11-09 DIAGNOSIS — Z741 Need for assistance with personal care: Secondary | ICD-10-CM | POA: Diagnosis not present

## 2020-11-09 DIAGNOSIS — R262 Difficulty in walking, not elsewhere classified: Secondary | ICD-10-CM | POA: Diagnosis not present

## 2020-11-09 DIAGNOSIS — R2681 Unsteadiness on feet: Secondary | ICD-10-CM | POA: Diagnosis not present

## 2020-11-09 DIAGNOSIS — M25551 Pain in right hip: Secondary | ICD-10-CM | POA: Diagnosis not present

## 2020-11-09 DIAGNOSIS — M1990 Unspecified osteoarthritis, unspecified site: Secondary | ICD-10-CM | POA: Diagnosis not present

## 2020-11-12 ENCOUNTER — Other Ambulatory Visit: Payer: Self-pay | Admitting: Orthopedic Surgery

## 2020-11-12 DIAGNOSIS — R262 Difficulty in walking, not elsewhere classified: Secondary | ICD-10-CM | POA: Diagnosis not present

## 2020-11-12 DIAGNOSIS — R2681 Unsteadiness on feet: Secondary | ICD-10-CM | POA: Diagnosis not present

## 2020-11-12 DIAGNOSIS — M1990 Unspecified osteoarthritis, unspecified site: Secondary | ICD-10-CM | POA: Diagnosis not present

## 2020-11-12 DIAGNOSIS — M6281 Muscle weakness (generalized): Secondary | ICD-10-CM | POA: Diagnosis not present

## 2020-11-12 DIAGNOSIS — S32401K Unspecified fracture of right acetabulum, subsequent encounter for fracture with nonunion: Secondary | ICD-10-CM | POA: Diagnosis not present

## 2020-11-12 DIAGNOSIS — Z741 Need for assistance with personal care: Secondary | ICD-10-CM | POA: Diagnosis not present

## 2020-11-12 DIAGNOSIS — M25551 Pain in right hip: Secondary | ICD-10-CM | POA: Diagnosis not present

## 2020-11-13 DIAGNOSIS — M25551 Pain in right hip: Secondary | ICD-10-CM | POA: Diagnosis not present

## 2020-11-13 DIAGNOSIS — Z741 Need for assistance with personal care: Secondary | ICD-10-CM | POA: Diagnosis not present

## 2020-11-13 DIAGNOSIS — M1990 Unspecified osteoarthritis, unspecified site: Secondary | ICD-10-CM | POA: Diagnosis not present

## 2020-11-13 DIAGNOSIS — M6281 Muscle weakness (generalized): Secondary | ICD-10-CM | POA: Diagnosis not present

## 2020-11-13 DIAGNOSIS — R262 Difficulty in walking, not elsewhere classified: Secondary | ICD-10-CM | POA: Diagnosis not present

## 2020-11-13 DIAGNOSIS — R2681 Unsteadiness on feet: Secondary | ICD-10-CM | POA: Diagnosis not present

## 2020-11-14 DIAGNOSIS — R262 Difficulty in walking, not elsewhere classified: Secondary | ICD-10-CM | POA: Diagnosis not present

## 2020-11-14 DIAGNOSIS — Z741 Need for assistance with personal care: Secondary | ICD-10-CM | POA: Diagnosis not present

## 2020-11-14 DIAGNOSIS — M6281 Muscle weakness (generalized): Secondary | ICD-10-CM | POA: Diagnosis not present

## 2020-11-14 DIAGNOSIS — M1990 Unspecified osteoarthritis, unspecified site: Secondary | ICD-10-CM | POA: Diagnosis not present

## 2020-11-14 DIAGNOSIS — M25551 Pain in right hip: Secondary | ICD-10-CM | POA: Diagnosis not present

## 2020-11-14 DIAGNOSIS — R2681 Unsteadiness on feet: Secondary | ICD-10-CM | POA: Diagnosis not present

## 2020-11-17 DIAGNOSIS — I10 Essential (primary) hypertension: Secondary | ICD-10-CM | POA: Diagnosis not present

## 2020-11-17 DIAGNOSIS — M1990 Unspecified osteoarthritis, unspecified site: Secondary | ICD-10-CM | POA: Diagnosis not present

## 2020-11-17 DIAGNOSIS — F341 Dysthymic disorder: Secondary | ICD-10-CM | POA: Diagnosis not present

## 2020-11-17 DIAGNOSIS — F419 Anxiety disorder, unspecified: Secondary | ICD-10-CM | POA: Diagnosis not present

## 2020-11-17 DIAGNOSIS — R197 Diarrhea, unspecified: Secondary | ICD-10-CM | POA: Diagnosis not present

## 2020-11-19 DIAGNOSIS — M1990 Unspecified osteoarthritis, unspecified site: Secondary | ICD-10-CM | POA: Diagnosis not present

## 2020-11-19 DIAGNOSIS — Z741 Need for assistance with personal care: Secondary | ICD-10-CM | POA: Diagnosis not present

## 2020-11-19 DIAGNOSIS — R262 Difficulty in walking, not elsewhere classified: Secondary | ICD-10-CM | POA: Diagnosis not present

## 2020-11-19 DIAGNOSIS — M6281 Muscle weakness (generalized): Secondary | ICD-10-CM | POA: Diagnosis not present

## 2020-11-19 DIAGNOSIS — R2681 Unsteadiness on feet: Secondary | ICD-10-CM | POA: Diagnosis not present

## 2020-11-19 DIAGNOSIS — M25551 Pain in right hip: Secondary | ICD-10-CM | POA: Diagnosis not present

## 2020-11-21 DIAGNOSIS — M1990 Unspecified osteoarthritis, unspecified site: Secondary | ICD-10-CM | POA: Diagnosis not present

## 2020-11-21 DIAGNOSIS — S32401D Unspecified fracture of right acetabulum, subsequent encounter for fracture with routine healing: Secondary | ICD-10-CM | POA: Diagnosis not present

## 2020-11-21 DIAGNOSIS — R262 Difficulty in walking, not elsewhere classified: Secondary | ICD-10-CM | POA: Diagnosis not present

## 2020-11-21 DIAGNOSIS — R2681 Unsteadiness on feet: Secondary | ICD-10-CM | POA: Diagnosis not present

## 2020-11-21 DIAGNOSIS — Z741 Need for assistance with personal care: Secondary | ICD-10-CM | POA: Diagnosis not present

## 2020-11-21 DIAGNOSIS — M25551 Pain in right hip: Secondary | ICD-10-CM | POA: Diagnosis not present

## 2020-11-21 DIAGNOSIS — M6281 Muscle weakness (generalized): Secondary | ICD-10-CM | POA: Diagnosis not present

## 2020-11-22 DIAGNOSIS — M6281 Muscle weakness (generalized): Secondary | ICD-10-CM | POA: Diagnosis not present

## 2020-11-22 DIAGNOSIS — M1990 Unspecified osteoarthritis, unspecified site: Secondary | ICD-10-CM | POA: Diagnosis not present

## 2020-11-22 DIAGNOSIS — Z741 Need for assistance with personal care: Secondary | ICD-10-CM | POA: Diagnosis not present

## 2020-11-22 DIAGNOSIS — R2681 Unsteadiness on feet: Secondary | ICD-10-CM | POA: Diagnosis not present

## 2020-11-22 DIAGNOSIS — M25551 Pain in right hip: Secondary | ICD-10-CM | POA: Diagnosis not present

## 2020-11-22 DIAGNOSIS — R262 Difficulty in walking, not elsewhere classified: Secondary | ICD-10-CM | POA: Diagnosis not present

## 2020-11-23 DIAGNOSIS — R2681 Unsteadiness on feet: Secondary | ICD-10-CM | POA: Diagnosis not present

## 2020-11-23 DIAGNOSIS — M1990 Unspecified osteoarthritis, unspecified site: Secondary | ICD-10-CM | POA: Diagnosis not present

## 2020-11-23 DIAGNOSIS — M6281 Muscle weakness (generalized): Secondary | ICD-10-CM | POA: Diagnosis not present

## 2020-11-23 DIAGNOSIS — Z741 Need for assistance with personal care: Secondary | ICD-10-CM | POA: Diagnosis not present

## 2020-11-23 DIAGNOSIS — M25551 Pain in right hip: Secondary | ICD-10-CM | POA: Diagnosis not present

## 2020-11-23 DIAGNOSIS — R262 Difficulty in walking, not elsewhere classified: Secondary | ICD-10-CM | POA: Diagnosis not present

## 2020-11-26 DIAGNOSIS — R262 Difficulty in walking, not elsewhere classified: Secondary | ICD-10-CM | POA: Diagnosis not present

## 2020-11-26 DIAGNOSIS — M6281 Muscle weakness (generalized): Secondary | ICD-10-CM | POA: Diagnosis not present

## 2020-11-26 DIAGNOSIS — R2681 Unsteadiness on feet: Secondary | ICD-10-CM | POA: Diagnosis not present

## 2020-11-26 DIAGNOSIS — Z741 Need for assistance with personal care: Secondary | ICD-10-CM | POA: Diagnosis not present

## 2020-11-26 DIAGNOSIS — M1990 Unspecified osteoarthritis, unspecified site: Secondary | ICD-10-CM | POA: Diagnosis not present

## 2020-11-26 DIAGNOSIS — M25551 Pain in right hip: Secondary | ICD-10-CM | POA: Diagnosis not present

## 2020-11-27 DIAGNOSIS — R2681 Unsteadiness on feet: Secondary | ICD-10-CM | POA: Diagnosis not present

## 2020-11-27 DIAGNOSIS — M25551 Pain in right hip: Secondary | ICD-10-CM | POA: Diagnosis not present

## 2020-11-27 DIAGNOSIS — R262 Difficulty in walking, not elsewhere classified: Secondary | ICD-10-CM | POA: Diagnosis not present

## 2020-11-27 DIAGNOSIS — Z741 Need for assistance with personal care: Secondary | ICD-10-CM | POA: Diagnosis not present

## 2020-11-27 DIAGNOSIS — M6281 Muscle weakness (generalized): Secondary | ICD-10-CM | POA: Diagnosis not present

## 2020-11-27 DIAGNOSIS — M1990 Unspecified osteoarthritis, unspecified site: Secondary | ICD-10-CM | POA: Diagnosis not present

## 2020-11-28 DIAGNOSIS — R262 Difficulty in walking, not elsewhere classified: Secondary | ICD-10-CM | POA: Diagnosis not present

## 2020-11-28 DIAGNOSIS — M1990 Unspecified osteoarthritis, unspecified site: Secondary | ICD-10-CM | POA: Diagnosis not present

## 2020-11-28 DIAGNOSIS — M6281 Muscle weakness (generalized): Secondary | ICD-10-CM | POA: Diagnosis not present

## 2020-11-28 DIAGNOSIS — R2681 Unsteadiness on feet: Secondary | ICD-10-CM | POA: Diagnosis not present

## 2020-11-28 DIAGNOSIS — M25551 Pain in right hip: Secondary | ICD-10-CM | POA: Diagnosis not present

## 2020-11-28 DIAGNOSIS — Z741 Need for assistance with personal care: Secondary | ICD-10-CM | POA: Diagnosis not present

## 2020-11-29 DIAGNOSIS — Z741 Need for assistance with personal care: Secondary | ICD-10-CM | POA: Diagnosis not present

## 2020-11-29 DIAGNOSIS — M1990 Unspecified osteoarthritis, unspecified site: Secondary | ICD-10-CM | POA: Diagnosis not present

## 2020-11-29 DIAGNOSIS — R262 Difficulty in walking, not elsewhere classified: Secondary | ICD-10-CM | POA: Diagnosis not present

## 2020-11-29 DIAGNOSIS — M25551 Pain in right hip: Secondary | ICD-10-CM | POA: Diagnosis not present

## 2020-11-29 DIAGNOSIS — M6281 Muscle weakness (generalized): Secondary | ICD-10-CM | POA: Diagnosis not present

## 2020-11-29 DIAGNOSIS — R2681 Unsteadiness on feet: Secondary | ICD-10-CM | POA: Diagnosis not present

## 2020-11-30 DIAGNOSIS — Z741 Need for assistance with personal care: Secondary | ICD-10-CM | POA: Diagnosis not present

## 2020-11-30 DIAGNOSIS — M25551 Pain in right hip: Secondary | ICD-10-CM | POA: Diagnosis not present

## 2020-11-30 DIAGNOSIS — R262 Difficulty in walking, not elsewhere classified: Secondary | ICD-10-CM | POA: Diagnosis not present

## 2020-11-30 DIAGNOSIS — M1990 Unspecified osteoarthritis, unspecified site: Secondary | ICD-10-CM | POA: Diagnosis not present

## 2020-11-30 DIAGNOSIS — R2681 Unsteadiness on feet: Secondary | ICD-10-CM | POA: Diagnosis not present

## 2020-11-30 DIAGNOSIS — M6281 Muscle weakness (generalized): Secondary | ICD-10-CM | POA: Diagnosis not present

## 2020-12-01 NOTE — Progress Notes (Signed)
Cardiology Office Note   Date:  12/03/2020   ID:  Cindy [name], DOB Dec 15, 1930, MRN [id number]  PCP:  Patient, No Pcp Per  Cardiologist:  Peter Martinique, MD EP: None  Chief Complaint  Patient presents with  . Follow-up    CHF      History of Present Illness: Cindy [name] is a 86 y.o. female with a PMH of chronic combined CHF, HTN, PE/DVT, and dementia who presents for routine follow-up.  She was last evaluated by cardiology at an outpatient visit with Dr. Martinique 08/2020 for follow-up of a recent hospitalization 07/2020 where she was diagnosed with acute bilateral PE's and DVT. She was started on eliquis. Echo that admission showed EF 30-35%, G2DD, no RWMA, moderately elevated PA pressures with normal RV function/size, and no significant valvular abnormalities.  Prior to this she suffered a mechanical fall resulting in an acetabular fracture which was conservatively managed. At the time of her visit with Dr. Martinique she had complaints of nausea which was present prior to her hospitalization 07/2020, though denied any cardiac complaints. Her cardiomyopathy etiology remained unclear, but possibly 2/2 ischemia vs stress mediated. Her amlodipine was discontinued at that visit and she was started on carvedilol and losartan for conservative management as she was felt to be a poor candidate for invasive evaluation She was recommended to repeat an echocardiogram in 3 months after follow-up in 4-6 weeks, though neither of these occurred. Since her last visit she did suffer another fall when transferring from bed to chair, though thankfully work-up was benign.  She presents today for routine follow-up. She is here today with her daughter. She has been doing fairly well since her last visit. Working with physical therapy and is able to walk 46ft with her walker without difficulty. No complaints of chest pain, SOB, DOE, LE edema, dizziness, lightheadedness, syncope, orthopnea, or PND. Daughter  asks about expected length of therapy for her apixaban. No one has managed this medication since discharge and her previous PCP retired.    Past Medical History:  Diagnosis Date  . Arthritis   . Cancer ([place])    skin  . Colon cancer ([place]) 08/31/13   invasive squamous cell  . Deaf, left   . Depression   . GERD (gastroesophageal reflux disease)   . History of radiation therapy 10/10/13-11/23/13   anal canal 54GY/  . Hyperlipemia   . Hypertension   . Hypothyroidism   . PONV (postoperative nausea and vomiting)   . Skin cancer    Shingles 2012 Bad case    Past Surgical History:  Procedure Laterality Date  . ABDOMINAL HYSTERECTOMY    . APPENDECTOMY    . CESAREAN SECTION     2  . CHOLECYSTECTOMY    . COLONOSCOPY  08/31/13   invasive squamous cell colon  . ERCP    . ESOPHAGOGASTRODUODENOSCOPY  10/20/2012   Procedure: ESOPHAGOGASTRODUODENOSCOPY (EGD);  Surgeon: Arta Silence, MD;  Location: [facility];  Service: Endoscopy;  Laterality: Left;  . EYE SURGERY     cataracts  . KNEE ARTHROSCOPY  05/11/2012   Procedure: ARTHROSCOPY KNEE;  Surgeon: Hessie Dibble, MD;  Location: [facility];  Service: Orthopedics;  Laterality: Right;  left knee medial menisectomy and chondroplasty  . THYROIDECTOMY, PARTIAL    . TONSILLECTOMY       Current Outpatient Medications  Medication Sig Dispense Refill  . acetaminophen (TYLENOL) 325 MG tablet Take 650 mg by mouth every 6 (six) hours as needed  for moderate pain.    Marland Kitchen ALPRAZolam (XANAX) 0.25 MG tablet Take 1 tablet (0.25 mg total) by mouth at bedtime as needed for anxiety or sleep. 30 tablet 0  . apixaban (ELIQUIS) 5 MG TABS tablet Take 1 tablet (5 mg total) by mouth 2 (two) times daily. Starting 8/15 after completes 10 mg BID ending 8/14 (Patient taking differently: Take 5 mg by mouth 2 (two) times daily. Pt takes 1 tablet twice daily.) 60 tablet   . buPROPion (WELLBUTRIN SR) 200 MG 12 hr tablet Take 200 mg by mouth daily.     .  carvedilol (COREG) 6.25 MG tablet Take 1 tablet (6.25 mg total) by mouth 2 (two) times daily. 180 tablet 3  . docusate sodium (COLACE) 100 MG capsule Take 100 mg by mouth 2 (two) times daily.    Marland Kitchen escitalopram (LEXAPRO) 10 MG tablet Take 10 mg by mouth daily.    Marland Kitchen ibuprofen (ADVIL) 200 MG tablet Take 200 mg by mouth every 6 (six) hours as needed.    Marland Kitchen levothyroxine (SYNTHROID, LEVOTHROID) 88 MCG tablet Take 88 mcg by mouth daily before breakfast.    . methocarbamol (ROBAXIN) 500 MG tablet Take 500 mg by mouth 3 (three) times daily.    . ondansetron (ZOFRAN ODT) 4 MG disintegrating tablet Take 1 tablet (4 mg total) by mouth every 8 (eight) hours as needed for nausea or vomiting. 10 tablet 0  . pantoprazole (PROTONIX) 40 MG tablet Take 40 mg by mouth daily.    . polyethylene glycol (MIRALAX / GLYCOLAX) 17 g packet Take 17 g by mouth daily.    Marland Kitchen apixaban (ELIQUIS) 5 MG TABS tablet Take 2 tablets (10 mg total) by mouth 2 (two) times daily for 5 days. 60 tablet    No current facility-administered medications for this visit.    Allergies:   Vancomycin, Zosyn [piperacillin sod-tazobactam so], Hydrocodone, and Morphine and related    Social History:  The patient  reports that she has never smoked. She has never used smokeless tobacco. She reports that she does not drink alcohol and does not use drugs.   Family History:  The patient's family history includes Cancer in her daughter, sister, sister, and sister; Heart attack in her father; Heart disease in her father; Kidney disease in her mother.    ROS:  Please see the history of present illness.   Otherwise, review of systems are positive for none.   All other systems are reviewed and negative.    PHYSICAL EXAM: VS:  BP 132/68 (BP Location: Left Arm, Patient Position: Sitting)   Pulse (!) 58   Ht 5\' 6"  (1.676 m)   Wt 142 lb 3.2 oz (64.5 kg)   SpO2 97%   BMI 22.95 kg/m  , BMI Body mass index is 22.95 kg/m. GEN: Well nourished, well developed,  sitting in a wheelchair in no acute distress HEENT: sclera anicteric Neck: no JVD, carotid bruits, or masses Cardiac: RRR; no murmurs, rubs, or gallops,no edema  Respiratory:  clear to auscultation bilaterally, normal work of breathing GI: soft, nontender, nondistended, + BS MS: no deformity or atrophy Skin: warm and dry, no rash Neuro:  Strength and sensation are intact Psych: euthymic mood, full affect   EKG:  EKG is not ordered today.   Recent Labs: 07/28/2020: ALT 37; B Natriuretic Peptide 969.7 07/30/2020: BUN 18; Creatinine, Ser 1.00; Hemoglobin 8.1; Magnesium 2.3; Platelets 342; Potassium 4.1; Sodium 127    Lipid Panel No results found for: CHOL, TRIG, HDL,  CHOLHDL, VLDL, LDLCALC, LDLDIRECT    Wt Readings from Last 3 Encounters:  12/03/20 142 lb 3.2 oz (64.5 kg)  10/26/20 130 lb (59 kg)  07/28/20 154 lb 15.7 oz (70.3 kg)      Other studies Reviewed: Additional studies/ records that were reviewed today include:   Echocardiogram 07/2020: 1. Left ventricular ejection fraction, by estimation, is 30-35%. The left  ventricle has mildly decreased function. The left ventricle has no  regional wall motion abnormalities. Left ventricular diastolic parameters  are consistent with Grade II diastolic  dysfunction (pseudonormalization). Elevated left ventricular  end-diastolic pressure. There is possible akinesis of the left  ventricular, apical septal wall, apical segment, anterior wall and  inferolateral wall. There is akinesis of the left ventricular,  mid anteroseptal wall.  2. Right ventricular systolic function is normal. The right ventricular  size is normal. There is moderately elevated pulmonary artery systolic  pressure. The estimated right ventricular systolic pressure is 16.0 mmHg.  3. The mitral valve is normal in structure. Mild mitral valve  regurgitation. No evidence of mitral stenosis.  4. The aortic valve is normal in structure. Aortic valve regurgitation  is  not visualized. Mild to moderate aortic valve sclerosis/calcification is  present, without any evidence of aortic stenosis.  5. The inferior vena cava is normal in size with greater than 50%  respiratory variability, suggesting right atrial pressure of 3 mmHg.  6. Definity contrast study has been ordered to confirm wall motion  abnormalities.     ASSESSMENT AND PLAN:  1. Chronic combined CHF: no volume overload complaints and she appears euvolemic on exam - Will repeat an echocardiogram to reevaluate LV function - Continue carvedilol and losartan - Continue low sodium diet and monitoring of weights  2. HTN: BP 132/68 today.  - Managed in the context of CHF  3. PE/DVT: sounds like a provoked incident following a recent fall with increased sedentary state. No complaints of bleeding on eliquis. Daughter asking when eliquis can be stopped. No one has been managing her eliquis since discharge 07/2020 - Continue eliquis for now - Will check with Dr. Martinique on timing of discontinuation - favor a 6 month course given bilateral PE's and evidence of DVT.     Current medicines are reviewed at length with the patient today.  The patient does not have concerns regarding medicines.  The following changes have been made:  As above  Labs/ tests ordered today include:   Orders Placed This Encounter  Procedures  . ECHOCARDIOGRAM COMPLETE     Disposition:   FU with Dr. Martinique in 6 months  Signed, [name], PA-C  12/03/2020 3:25 PM

## 2020-12-03 ENCOUNTER — Encounter: Payer: Self-pay | Admitting: Medical

## 2020-12-03 ENCOUNTER — Ambulatory Visit (INDEPENDENT_AMBULATORY_CARE_PROVIDER_SITE_OTHER): Payer: Medicare Other | Admitting: Medical

## 2020-12-03 ENCOUNTER — Other Ambulatory Visit: Payer: Self-pay

## 2020-12-03 ENCOUNTER — Inpatient Hospital Stay: Admission: RE | Admit: 2020-12-03 | Payer: Medicare Other | Source: Ambulatory Visit

## 2020-12-03 VITALS — BP 132/68 | HR 58 | Ht 66.0 in | Wt 142.2 lb

## 2020-12-03 DIAGNOSIS — M6281 Muscle weakness (generalized): Secondary | ICD-10-CM | POA: Diagnosis not present

## 2020-12-03 DIAGNOSIS — R262 Difficulty in walking, not elsewhere classified: Secondary | ICD-10-CM | POA: Diagnosis not present

## 2020-12-03 DIAGNOSIS — I82401 Acute embolism and thrombosis of unspecified deep veins of right lower extremity: Secondary | ICD-10-CM

## 2020-12-03 DIAGNOSIS — F0390 Unspecified dementia without behavioral disturbance: Secondary | ICD-10-CM | POA: Diagnosis not present

## 2020-12-03 DIAGNOSIS — I2694 Multiple subsegmental pulmonary emboli without acute cor pulmonale: Secondary | ICD-10-CM

## 2020-12-03 DIAGNOSIS — I5042 Chronic combined systolic (congestive) and diastolic (congestive) heart failure: Secondary | ICD-10-CM | POA: Diagnosis not present

## 2020-12-03 DIAGNOSIS — I10 Essential (primary) hypertension: Secondary | ICD-10-CM

## 2020-12-03 DIAGNOSIS — M25551 Pain in right hip: Secondary | ICD-10-CM | POA: Diagnosis not present

## 2020-12-03 DIAGNOSIS — Z741 Need for assistance with personal care: Secondary | ICD-10-CM | POA: Diagnosis not present

## 2020-12-03 DIAGNOSIS — M1990 Unspecified osteoarthritis, unspecified site: Secondary | ICD-10-CM | POA: Diagnosis not present

## 2020-12-03 DIAGNOSIS — R2681 Unsteadiness on feet: Secondary | ICD-10-CM | POA: Diagnosis not present

## 2020-12-03 NOTE — Patient Instructions (Signed)
Medication Instructions:  Your physician recommends that you continue on your current medications as directed. Please refer to the Current Medication list given to you today.  *If you need a refill on your cardiac medications before your next appointment, please call your pharmacy*  Lab Work: NONE ordered at this time of appointment   If you have labs (blood work) drawn today and your tests are completely normal, you will receive your results only by: Marland Kitchen MyChart Message (if you have MyChart) OR . A paper copy in the mail If you have any lab test that is abnormal or we need to change your treatment, we will call you to review the results.  Testing/Procedures: Your physician has requested that you have an echocardiogram. Echocardiography is a painless test that uses sound waves to create images of your heart. It provides your doctor with information about the size and shape of your heart and how well your heart's chambers and valves are working. This procedure takes approximately one hour. There are no restrictions for this procedure.   Please schedule for 3-4 weeks    Follow-Up: At [facility], you and your health needs are our priority.  As part of our continuing mission to provide you with exceptional heart care, we have created designated Provider Care Teams.  These Care Teams include your primary Cardiologist (physician) and Advanced Practice Providers (APPs -  Physician Assistants and Nurse Practitioners) who all work together to provide you with the care you need, when you need it.  We recommend signing up for the patient portal called "MyChart".  Sign up information is provided on this After Visit Summary.  MyChart is used to connect with patients for Virtual Visits (Telemedicine).  Patients are able to view lab/test results, encounter notes, upcoming appointments, etc.  Non-urgent messages can be sent to your provider as well.   To learn more about what you can do with MyChart, go to  [email or web address].    Your next appointment:   6 month(s)  The format for your next appointment:   In Person  Provider:   Peter Martinique, MD  Other Instructions

## 2020-12-04 DIAGNOSIS — M6281 Muscle weakness (generalized): Secondary | ICD-10-CM | POA: Diagnosis not present

## 2020-12-04 DIAGNOSIS — Z741 Need for assistance with personal care: Secondary | ICD-10-CM | POA: Diagnosis not present

## 2020-12-04 DIAGNOSIS — M25551 Pain in right hip: Secondary | ICD-10-CM | POA: Diagnosis not present

## 2020-12-04 DIAGNOSIS — R2681 Unsteadiness on feet: Secondary | ICD-10-CM | POA: Diagnosis not present

## 2020-12-04 DIAGNOSIS — R262 Difficulty in walking, not elsewhere classified: Secondary | ICD-10-CM | POA: Diagnosis not present

## 2020-12-04 DIAGNOSIS — M1990 Unspecified osteoarthritis, unspecified site: Secondary | ICD-10-CM | POA: Diagnosis not present

## 2020-12-05 DIAGNOSIS — Z741 Need for assistance with personal care: Secondary | ICD-10-CM | POA: Diagnosis not present

## 2020-12-05 DIAGNOSIS — R262 Difficulty in walking, not elsewhere classified: Secondary | ICD-10-CM | POA: Diagnosis not present

## 2020-12-05 DIAGNOSIS — M6281 Muscle weakness (generalized): Secondary | ICD-10-CM | POA: Diagnosis not present

## 2020-12-05 DIAGNOSIS — R2681 Unsteadiness on feet: Secondary | ICD-10-CM | POA: Diagnosis not present

## 2020-12-05 DIAGNOSIS — M25551 Pain in right hip: Secondary | ICD-10-CM | POA: Diagnosis not present

## 2020-12-05 DIAGNOSIS — M1990 Unspecified osteoarthritis, unspecified site: Secondary | ICD-10-CM | POA: Diagnosis not present

## 2020-12-06 DIAGNOSIS — M25551 Pain in right hip: Secondary | ICD-10-CM | POA: Diagnosis not present

## 2020-12-06 DIAGNOSIS — Z741 Need for assistance with personal care: Secondary | ICD-10-CM | POA: Diagnosis not present

## 2020-12-06 DIAGNOSIS — M1990 Unspecified osteoarthritis, unspecified site: Secondary | ICD-10-CM | POA: Diagnosis not present

## 2020-12-06 DIAGNOSIS — R2681 Unsteadiness on feet: Secondary | ICD-10-CM | POA: Diagnosis not present

## 2020-12-06 DIAGNOSIS — R262 Difficulty in walking, not elsewhere classified: Secondary | ICD-10-CM | POA: Diagnosis not present

## 2020-12-06 DIAGNOSIS — M6281 Muscle weakness (generalized): Secondary | ICD-10-CM | POA: Diagnosis not present

## 2020-12-10 DIAGNOSIS — M25551 Pain in right hip: Secondary | ICD-10-CM | POA: Diagnosis not present

## 2020-12-10 DIAGNOSIS — R262 Difficulty in walking, not elsewhere classified: Secondary | ICD-10-CM | POA: Diagnosis not present

## 2020-12-10 DIAGNOSIS — Z741 Need for assistance with personal care: Secondary | ICD-10-CM | POA: Diagnosis not present

## 2020-12-10 DIAGNOSIS — M6281 Muscle weakness (generalized): Secondary | ICD-10-CM | POA: Diagnosis not present

## 2020-12-10 DIAGNOSIS — M1990 Unspecified osteoarthritis, unspecified site: Secondary | ICD-10-CM | POA: Diagnosis not present

## 2020-12-10 DIAGNOSIS — R2681 Unsteadiness on feet: Secondary | ICD-10-CM | POA: Diagnosis not present

## 2020-12-11 DIAGNOSIS — M1990 Unspecified osteoarthritis, unspecified site: Secondary | ICD-10-CM | POA: Diagnosis not present

## 2020-12-11 DIAGNOSIS — R262 Difficulty in walking, not elsewhere classified: Secondary | ICD-10-CM | POA: Diagnosis not present

## 2020-12-11 DIAGNOSIS — R2681 Unsteadiness on feet: Secondary | ICD-10-CM | POA: Diagnosis not present

## 2020-12-11 DIAGNOSIS — Z741 Need for assistance with personal care: Secondary | ICD-10-CM | POA: Diagnosis not present

## 2020-12-11 DIAGNOSIS — M25551 Pain in right hip: Secondary | ICD-10-CM | POA: Diagnosis not present

## 2020-12-11 DIAGNOSIS — M6281 Muscle weakness (generalized): Secondary | ICD-10-CM | POA: Diagnosis not present

## 2020-12-15 DIAGNOSIS — M1990 Unspecified osteoarthritis, unspecified site: Secondary | ICD-10-CM | POA: Diagnosis not present

## 2020-12-15 DIAGNOSIS — I10 Essential (primary) hypertension: Secondary | ICD-10-CM | POA: Diagnosis not present

## 2020-12-15 DIAGNOSIS — F419 Anxiety disorder, unspecified: Secondary | ICD-10-CM | POA: Diagnosis not present

## 2020-12-19 DIAGNOSIS — M25551 Pain in right hip: Secondary | ICD-10-CM | POA: Diagnosis not present

## 2020-12-19 DIAGNOSIS — R262 Difficulty in walking, not elsewhere classified: Secondary | ICD-10-CM | POA: Diagnosis not present

## 2020-12-19 DIAGNOSIS — R2681 Unsteadiness on feet: Secondary | ICD-10-CM | POA: Diagnosis not present

## 2020-12-19 DIAGNOSIS — M6281 Muscle weakness (generalized): Secondary | ICD-10-CM | POA: Diagnosis not present

## 2020-12-19 DIAGNOSIS — M1990 Unspecified osteoarthritis, unspecified site: Secondary | ICD-10-CM | POA: Diagnosis not present

## 2020-12-19 DIAGNOSIS — Z741 Need for assistance with personal care: Secondary | ICD-10-CM | POA: Diagnosis not present

## 2020-12-20 DIAGNOSIS — Z741 Need for assistance with personal care: Secondary | ICD-10-CM | POA: Diagnosis not present

## 2020-12-20 DIAGNOSIS — M25551 Pain in right hip: Secondary | ICD-10-CM | POA: Diagnosis not present

## 2020-12-20 DIAGNOSIS — R2681 Unsteadiness on feet: Secondary | ICD-10-CM | POA: Diagnosis not present

## 2020-12-20 DIAGNOSIS — M1990 Unspecified osteoarthritis, unspecified site: Secondary | ICD-10-CM | POA: Diagnosis not present

## 2020-12-20 DIAGNOSIS — M6281 Muscle weakness (generalized): Secondary | ICD-10-CM | POA: Diagnosis not present

## 2020-12-20 DIAGNOSIS — R262 Difficulty in walking, not elsewhere classified: Secondary | ICD-10-CM | POA: Diagnosis not present

## 2020-12-21 DIAGNOSIS — F341 Dysthymic disorder: Secondary | ICD-10-CM | POA: Diagnosis not present

## 2020-12-21 DIAGNOSIS — I10 Essential (primary) hypertension: Secondary | ICD-10-CM | POA: Diagnosis not present

## 2020-12-21 DIAGNOSIS — M25551 Pain in right hip: Secondary | ICD-10-CM | POA: Diagnosis not present

## 2020-12-21 DIAGNOSIS — K219 Gastro-esophageal reflux disease without esophagitis: Secondary | ICD-10-CM | POA: Diagnosis not present

## 2020-12-21 DIAGNOSIS — Z741 Need for assistance with personal care: Secondary | ICD-10-CM | POA: Diagnosis not present

## 2020-12-21 DIAGNOSIS — F419 Anxiety disorder, unspecified: Secondary | ICD-10-CM | POA: Diagnosis not present

## 2020-12-21 DIAGNOSIS — R2681 Unsteadiness on feet: Secondary | ICD-10-CM | POA: Diagnosis not present

## 2020-12-21 DIAGNOSIS — M6281 Muscle weakness (generalized): Secondary | ICD-10-CM | POA: Diagnosis not present

## 2020-12-21 DIAGNOSIS — M1990 Unspecified osteoarthritis, unspecified site: Secondary | ICD-10-CM | POA: Diagnosis not present

## 2020-12-21 DIAGNOSIS — R262 Difficulty in walking, not elsewhere classified: Secondary | ICD-10-CM | POA: Diagnosis not present

## 2020-12-21 DIAGNOSIS — I509 Heart failure, unspecified: Secondary | ICD-10-CM | POA: Diagnosis not present

## 2020-12-25 DIAGNOSIS — M1990 Unspecified osteoarthritis, unspecified site: Secondary | ICD-10-CM | POA: Diagnosis not present

## 2020-12-25 DIAGNOSIS — M25551 Pain in right hip: Secondary | ICD-10-CM | POA: Diagnosis not present

## 2020-12-25 DIAGNOSIS — Z741 Need for assistance with personal care: Secondary | ICD-10-CM | POA: Diagnosis not present

## 2020-12-25 DIAGNOSIS — M6281 Muscle weakness (generalized): Secondary | ICD-10-CM | POA: Diagnosis not present

## 2020-12-25 DIAGNOSIS — R2681 Unsteadiness on feet: Secondary | ICD-10-CM | POA: Diagnosis not present

## 2020-12-25 DIAGNOSIS — S32401D Unspecified fracture of right acetabulum, subsequent encounter for fracture with routine healing: Secondary | ICD-10-CM | POA: Diagnosis not present

## 2020-12-25 DIAGNOSIS — R262 Difficulty in walking, not elsewhere classified: Secondary | ICD-10-CM | POA: Diagnosis not present

## 2020-12-26 ENCOUNTER — Other Ambulatory Visit: Payer: Self-pay

## 2020-12-26 ENCOUNTER — Encounter (HOSPITAL_COMMUNITY): Payer: Self-pay

## 2020-12-26 ENCOUNTER — Ambulatory Visit (HOSPITAL_COMMUNITY): Payer: Medicare Other | Attending: Cardiology

## 2020-12-26 DIAGNOSIS — M6281 Muscle weakness (generalized): Secondary | ICD-10-CM | POA: Diagnosis not present

## 2020-12-26 DIAGNOSIS — R262 Difficulty in walking, not elsewhere classified: Secondary | ICD-10-CM | POA: Diagnosis not present

## 2020-12-26 DIAGNOSIS — I5042 Chronic combined systolic (congestive) and diastolic (congestive) heart failure: Secondary | ICD-10-CM | POA: Diagnosis not present

## 2020-12-26 DIAGNOSIS — R2681 Unsteadiness on feet: Secondary | ICD-10-CM | POA: Diagnosis not present

## 2020-12-26 DIAGNOSIS — Z741 Need for assistance with personal care: Secondary | ICD-10-CM | POA: Diagnosis not present

## 2020-12-26 DIAGNOSIS — M25551 Pain in right hip: Secondary | ICD-10-CM | POA: Diagnosis not present

## 2020-12-26 DIAGNOSIS — M1990 Unspecified osteoarthritis, unspecified site: Secondary | ICD-10-CM | POA: Diagnosis not present

## 2020-12-26 LAB — ECHOCARDIOGRAM COMPLETE
Area-P 1/2: 5.66 cm2
MV M vel: 5.58 m/s
MV Peak grad: 124.5 mmHg
Radius: 0.8 cm
S' Lateral: 3.6 cm

## 2020-12-26 NOTE — Progress Notes (Signed)
Cindy [name] presented for echocardiogram. PA pressure has increased from (echo from 07/2020) to . DOD (Dr. Johney Frame) was notified of the finding. Dr. Johney Frame advised the patient was stable to be discharged.

## 2020-12-27 DIAGNOSIS — M1990 Unspecified osteoarthritis, unspecified site: Secondary | ICD-10-CM | POA: Diagnosis not present

## 2020-12-27 DIAGNOSIS — R2681 Unsteadiness on feet: Secondary | ICD-10-CM | POA: Diagnosis not present

## 2020-12-27 DIAGNOSIS — R262 Difficulty in walking, not elsewhere classified: Secondary | ICD-10-CM | POA: Diagnosis not present

## 2020-12-27 DIAGNOSIS — M6281 Muscle weakness (generalized): Secondary | ICD-10-CM | POA: Diagnosis not present

## 2020-12-27 DIAGNOSIS — Z741 Need for assistance with personal care: Secondary | ICD-10-CM | POA: Diagnosis not present

## 2020-12-27 DIAGNOSIS — M25551 Pain in right hip: Secondary | ICD-10-CM | POA: Diagnosis not present

## 2020-12-31 DIAGNOSIS — Z741 Need for assistance with personal care: Secondary | ICD-10-CM | POA: Diagnosis not present

## 2020-12-31 DIAGNOSIS — M6281 Muscle weakness (generalized): Secondary | ICD-10-CM | POA: Diagnosis not present

## 2020-12-31 DIAGNOSIS — R262 Difficulty in walking, not elsewhere classified: Secondary | ICD-10-CM | POA: Diagnosis not present

## 2020-12-31 DIAGNOSIS — M1990 Unspecified osteoarthritis, unspecified site: Secondary | ICD-10-CM | POA: Diagnosis not present

## 2020-12-31 DIAGNOSIS — R2681 Unsteadiness on feet: Secondary | ICD-10-CM | POA: Diagnosis not present

## 2020-12-31 DIAGNOSIS — M25551 Pain in right hip: Secondary | ICD-10-CM | POA: Diagnosis not present

## 2021-01-02 DIAGNOSIS — M25551 Pain in right hip: Secondary | ICD-10-CM | POA: Diagnosis not present

## 2021-01-02 DIAGNOSIS — M6281 Muscle weakness (generalized): Secondary | ICD-10-CM | POA: Diagnosis not present

## 2021-01-02 DIAGNOSIS — M1990 Unspecified osteoarthritis, unspecified site: Secondary | ICD-10-CM | POA: Diagnosis not present

## 2021-01-02 DIAGNOSIS — R2681 Unsteadiness on feet: Secondary | ICD-10-CM | POA: Diagnosis not present

## 2021-01-02 DIAGNOSIS — Z741 Need for assistance with personal care: Secondary | ICD-10-CM | POA: Diagnosis not present

## 2021-01-02 DIAGNOSIS — R262 Difficulty in walking, not elsewhere classified: Secondary | ICD-10-CM | POA: Diagnosis not present

## 2021-01-03 DIAGNOSIS — R262 Difficulty in walking, not elsewhere classified: Secondary | ICD-10-CM | POA: Diagnosis not present

## 2021-01-03 DIAGNOSIS — R2681 Unsteadiness on feet: Secondary | ICD-10-CM | POA: Diagnosis not present

## 2021-01-03 DIAGNOSIS — Z741 Need for assistance with personal care: Secondary | ICD-10-CM | POA: Diagnosis not present

## 2021-01-03 DIAGNOSIS — M6281 Muscle weakness (generalized): Secondary | ICD-10-CM | POA: Diagnosis not present

## 2021-01-03 DIAGNOSIS — M25551 Pain in right hip: Secondary | ICD-10-CM | POA: Diagnosis not present

## 2021-01-03 DIAGNOSIS — M1990 Unspecified osteoarthritis, unspecified site: Secondary | ICD-10-CM | POA: Diagnosis not present

## 2021-01-04 DIAGNOSIS — R262 Difficulty in walking, not elsewhere classified: Secondary | ICD-10-CM | POA: Diagnosis not present

## 2021-01-04 DIAGNOSIS — M6281 Muscle weakness (generalized): Secondary | ICD-10-CM | POA: Diagnosis not present

## 2021-01-04 DIAGNOSIS — Z741 Need for assistance with personal care: Secondary | ICD-10-CM | POA: Diagnosis not present

## 2021-01-04 DIAGNOSIS — M25551 Pain in right hip: Secondary | ICD-10-CM | POA: Diagnosis not present

## 2021-01-04 DIAGNOSIS — R2681 Unsteadiness on feet: Secondary | ICD-10-CM | POA: Diagnosis not present

## 2021-01-04 DIAGNOSIS — M1990 Unspecified osteoarthritis, unspecified site: Secondary | ICD-10-CM | POA: Diagnosis not present

## 2021-01-05 DIAGNOSIS — R2681 Unsteadiness on feet: Secondary | ICD-10-CM | POA: Diagnosis not present

## 2021-01-05 DIAGNOSIS — Z741 Need for assistance with personal care: Secondary | ICD-10-CM | POA: Diagnosis not present

## 2021-01-05 DIAGNOSIS — M25551 Pain in right hip: Secondary | ICD-10-CM | POA: Diagnosis not present

## 2021-01-05 DIAGNOSIS — M6281 Muscle weakness (generalized): Secondary | ICD-10-CM | POA: Diagnosis not present

## 2021-01-05 DIAGNOSIS — R262 Difficulty in walking, not elsewhere classified: Secondary | ICD-10-CM | POA: Diagnosis not present

## 2021-01-05 DIAGNOSIS — M1990 Unspecified osteoarthritis, unspecified site: Secondary | ICD-10-CM | POA: Diagnosis not present

## 2021-01-08 ENCOUNTER — Telehealth: Payer: Self-pay

## 2021-01-08 NOTE — Telephone Encounter (Addendum)
Left voice message asking patient Cindy [name] to give office a call for her ECHO results.  ----- Message from [name], PA-C sent at 01/03/2021  1:41 PM EST ----- Please notify the patient that the ultrasound of her heart shows her pumping function has improved a bit from 07/2020, though remains below normal. She should continue taking her current medications as prescribed, monitor her weights daily, and limit her salt intake. Additionally her mitral valve continues to be leaky and appears slightly worse than 07/2020. We will continue to monitor this routinely going forward but this does not require any further work-up or changes to medications at this time. Thank you!

## 2021-01-09 DIAGNOSIS — R262 Difficulty in walking, not elsewhere classified: Secondary | ICD-10-CM | POA: Diagnosis not present

## 2021-01-09 DIAGNOSIS — R2681 Unsteadiness on feet: Secondary | ICD-10-CM | POA: Diagnosis not present

## 2021-01-09 DIAGNOSIS — M6281 Muscle weakness (generalized): Secondary | ICD-10-CM | POA: Diagnosis not present

## 2021-01-09 DIAGNOSIS — Z741 Need for assistance with personal care: Secondary | ICD-10-CM | POA: Diagnosis not present

## 2021-01-09 DIAGNOSIS — M25551 Pain in right hip: Secondary | ICD-10-CM | POA: Diagnosis not present

## 2021-01-09 DIAGNOSIS — M1990 Unspecified osteoarthritis, unspecified site: Secondary | ICD-10-CM | POA: Diagnosis not present

## 2021-01-11 DIAGNOSIS — M25551 Pain in right hip: Secondary | ICD-10-CM | POA: Diagnosis not present

## 2021-01-11 DIAGNOSIS — R262 Difficulty in walking, not elsewhere classified: Secondary | ICD-10-CM | POA: Diagnosis not present

## 2021-01-11 DIAGNOSIS — M1990 Unspecified osteoarthritis, unspecified site: Secondary | ICD-10-CM | POA: Diagnosis not present

## 2021-01-11 DIAGNOSIS — R2681 Unsteadiness on feet: Secondary | ICD-10-CM | POA: Diagnosis not present

## 2021-01-11 DIAGNOSIS — Z741 Need for assistance with personal care: Secondary | ICD-10-CM | POA: Diagnosis not present

## 2021-01-11 DIAGNOSIS — M6281 Muscle weakness (generalized): Secondary | ICD-10-CM | POA: Diagnosis not present

## 2021-01-14 DIAGNOSIS — M6281 Muscle weakness (generalized): Secondary | ICD-10-CM | POA: Diagnosis not present

## 2021-01-14 DIAGNOSIS — R262 Difficulty in walking, not elsewhere classified: Secondary | ICD-10-CM | POA: Diagnosis not present

## 2021-01-14 DIAGNOSIS — M25551 Pain in right hip: Secondary | ICD-10-CM | POA: Diagnosis not present

## 2021-01-14 DIAGNOSIS — R2681 Unsteadiness on feet: Secondary | ICD-10-CM | POA: Diagnosis not present

## 2021-01-14 DIAGNOSIS — M1990 Unspecified osteoarthritis, unspecified site: Secondary | ICD-10-CM | POA: Diagnosis not present

## 2021-01-14 DIAGNOSIS — Z741 Need for assistance with personal care: Secondary | ICD-10-CM | POA: Diagnosis not present

## 2021-01-15 DIAGNOSIS — R2681 Unsteadiness on feet: Secondary | ICD-10-CM | POA: Diagnosis not present

## 2021-01-15 DIAGNOSIS — Z741 Need for assistance with personal care: Secondary | ICD-10-CM | POA: Diagnosis not present

## 2021-01-15 DIAGNOSIS — M6281 Muscle weakness (generalized): Secondary | ICD-10-CM | POA: Diagnosis not present

## 2021-01-15 DIAGNOSIS — M1990 Unspecified osteoarthritis, unspecified site: Secondary | ICD-10-CM | POA: Diagnosis not present

## 2021-01-15 DIAGNOSIS — M25551 Pain in right hip: Secondary | ICD-10-CM | POA: Diagnosis not present

## 2021-01-15 DIAGNOSIS — R262 Difficulty in walking, not elsewhere classified: Secondary | ICD-10-CM | POA: Diagnosis not present

## 2021-01-16 DIAGNOSIS — S32471A Displaced fracture of medial wall of right acetabulum, initial encounter for closed fracture: Secondary | ICD-10-CM | POA: Diagnosis not present

## 2021-01-17 ENCOUNTER — Other Ambulatory Visit: Payer: Self-pay | Admitting: Orthopedic Surgery

## 2021-01-17 DIAGNOSIS — M25551 Pain in right hip: Secondary | ICD-10-CM

## 2021-01-17 DIAGNOSIS — Z741 Need for assistance with personal care: Secondary | ICD-10-CM | POA: Diagnosis not present

## 2021-01-17 DIAGNOSIS — R262 Difficulty in walking, not elsewhere classified: Secondary | ICD-10-CM | POA: Diagnosis not present

## 2021-01-17 DIAGNOSIS — M6281 Muscle weakness (generalized): Secondary | ICD-10-CM | POA: Diagnosis not present

## 2021-01-17 DIAGNOSIS — R2681 Unsteadiness on feet: Secondary | ICD-10-CM | POA: Diagnosis not present

## 2021-01-17 DIAGNOSIS — M1990 Unspecified osteoarthritis, unspecified site: Secondary | ICD-10-CM | POA: Diagnosis not present

## 2021-01-18 DIAGNOSIS — R2681 Unsteadiness on feet: Secondary | ICD-10-CM | POA: Diagnosis not present

## 2021-01-18 DIAGNOSIS — M1990 Unspecified osteoarthritis, unspecified site: Secondary | ICD-10-CM | POA: Diagnosis not present

## 2021-01-18 DIAGNOSIS — M25551 Pain in right hip: Secondary | ICD-10-CM | POA: Diagnosis not present

## 2021-01-18 DIAGNOSIS — R262 Difficulty in walking, not elsewhere classified: Secondary | ICD-10-CM | POA: Diagnosis not present

## 2021-01-18 DIAGNOSIS — M6281 Muscle weakness (generalized): Secondary | ICD-10-CM | POA: Diagnosis not present

## 2021-01-18 DIAGNOSIS — Z741 Need for assistance with personal care: Secondary | ICD-10-CM | POA: Diagnosis not present

## 2021-01-21 DIAGNOSIS — M25551 Pain in right hip: Secondary | ICD-10-CM | POA: Diagnosis not present

## 2021-01-21 DIAGNOSIS — Z741 Need for assistance with personal care: Secondary | ICD-10-CM | POA: Diagnosis not present

## 2021-01-21 DIAGNOSIS — M6281 Muscle weakness (generalized): Secondary | ICD-10-CM | POA: Diagnosis not present

## 2021-01-21 DIAGNOSIS — M1990 Unspecified osteoarthritis, unspecified site: Secondary | ICD-10-CM | POA: Diagnosis not present

## 2021-01-21 DIAGNOSIS — R262 Difficulty in walking, not elsewhere classified: Secondary | ICD-10-CM | POA: Diagnosis not present

## 2021-01-21 DIAGNOSIS — R2681 Unsteadiness on feet: Secondary | ICD-10-CM | POA: Diagnosis not present

## 2021-01-22 DIAGNOSIS — M25551 Pain in right hip: Secondary | ICD-10-CM | POA: Diagnosis not present

## 2021-01-22 DIAGNOSIS — R2681 Unsteadiness on feet: Secondary | ICD-10-CM | POA: Diagnosis not present

## 2021-01-22 DIAGNOSIS — M6281 Muscle weakness (generalized): Secondary | ICD-10-CM | POA: Diagnosis not present

## 2021-01-22 DIAGNOSIS — S32401D Unspecified fracture of right acetabulum, subsequent encounter for fracture with routine healing: Secondary | ICD-10-CM | POA: Diagnosis not present

## 2021-01-22 DIAGNOSIS — Z741 Need for assistance with personal care: Secondary | ICD-10-CM | POA: Diagnosis not present

## 2021-01-22 DIAGNOSIS — R262 Difficulty in walking, not elsewhere classified: Secondary | ICD-10-CM | POA: Diagnosis not present

## 2021-01-22 DIAGNOSIS — M1990 Unspecified osteoarthritis, unspecified site: Secondary | ICD-10-CM | POA: Diagnosis not present

## 2021-01-23 DIAGNOSIS — R2681 Unsteadiness on feet: Secondary | ICD-10-CM | POA: Diagnosis not present

## 2021-01-23 DIAGNOSIS — M25551 Pain in right hip: Secondary | ICD-10-CM | POA: Diagnosis not present

## 2021-01-23 DIAGNOSIS — M1990 Unspecified osteoarthritis, unspecified site: Secondary | ICD-10-CM | POA: Diagnosis not present

## 2021-01-23 DIAGNOSIS — Z741 Need for assistance with personal care: Secondary | ICD-10-CM | POA: Diagnosis not present

## 2021-01-23 DIAGNOSIS — R262 Difficulty in walking, not elsewhere classified: Secondary | ICD-10-CM | POA: Diagnosis not present

## 2021-01-23 DIAGNOSIS — M6281 Muscle weakness (generalized): Secondary | ICD-10-CM | POA: Diagnosis not present

## 2021-01-24 DIAGNOSIS — M1990 Unspecified osteoarthritis, unspecified site: Secondary | ICD-10-CM | POA: Diagnosis not present

## 2021-01-24 DIAGNOSIS — R262 Difficulty in walking, not elsewhere classified: Secondary | ICD-10-CM | POA: Diagnosis not present

## 2021-01-24 DIAGNOSIS — M25551 Pain in right hip: Secondary | ICD-10-CM | POA: Diagnosis not present

## 2021-01-24 DIAGNOSIS — M6281 Muscle weakness (generalized): Secondary | ICD-10-CM | POA: Diagnosis not present

## 2021-01-24 DIAGNOSIS — Z741 Need for assistance with personal care: Secondary | ICD-10-CM | POA: Diagnosis not present

## 2021-01-24 DIAGNOSIS — R2681 Unsteadiness on feet: Secondary | ICD-10-CM | POA: Diagnosis not present

## 2021-01-25 ENCOUNTER — Telehealth: Payer: Self-pay | Admitting: Medical

## 2021-01-25 DIAGNOSIS — R262 Difficulty in walking, not elsewhere classified: Secondary | ICD-10-CM | POA: Diagnosis not present

## 2021-01-25 DIAGNOSIS — R2681 Unsteadiness on feet: Secondary | ICD-10-CM | POA: Diagnosis not present

## 2021-01-25 DIAGNOSIS — M1990 Unspecified osteoarthritis, unspecified site: Secondary | ICD-10-CM | POA: Diagnosis not present

## 2021-01-25 DIAGNOSIS — M6281 Muscle weakness (generalized): Secondary | ICD-10-CM | POA: Diagnosis not present

## 2021-01-25 DIAGNOSIS — Z741 Need for assistance with personal care: Secondary | ICD-10-CM | POA: Diagnosis not present

## 2021-01-25 DIAGNOSIS — M25551 Pain in right hip: Secondary | ICD-10-CM | POA: Diagnosis not present

## 2021-01-25 NOTE — Telephone Encounter (Signed)
Called patient's daughter Jerl Mina with the updated information from Roby Lofts, [place] about her mother Cindy [name] Eliquis medication. Per Daleen Snook it would be reasonable to stop the Eliquis on  02/29/22. She thanked me for calling and asked that I fax this information along with the Echo results report to [facility].   Information given by Mariella Saa is to make fax to the attention of Med Tech at [place]. Will print out the echo report and note from Roby Lofts, PA-C about Eliquis.

## 2021-01-25 NOTE — Telephone Encounter (Signed)
   Cindy [name], CMA notified the patient's daughter of her recent echocardiogram results. Patients daughter enquired as to when the patients eliquis could be discontinued following her PE 07/2020. Chart reviewed. Would be reasonable to stop eliquis 01/30/2021.   [name], can you please notify the patient's daughter and update her facility of these recommendations. Please update the med rec to reflect this change in her medications.   Thank you!  [name], PA-C 01/25/21; 4:22 PM

## 2021-01-25 NOTE — Telephone Encounter (Signed)
Left voice message for patient's daughter Mariella Saa to give our office a call to discuss her mothers Echo.

## 2021-01-28 DIAGNOSIS — R2681 Unsteadiness on feet: Secondary | ICD-10-CM | POA: Diagnosis not present

## 2021-01-28 DIAGNOSIS — R262 Difficulty in walking, not elsewhere classified: Secondary | ICD-10-CM | POA: Diagnosis not present

## 2021-01-28 DIAGNOSIS — M1990 Unspecified osteoarthritis, unspecified site: Secondary | ICD-10-CM | POA: Diagnosis not present

## 2021-01-28 DIAGNOSIS — M6281 Muscle weakness (generalized): Secondary | ICD-10-CM | POA: Diagnosis not present

## 2021-01-28 DIAGNOSIS — Z741 Need for assistance with personal care: Secondary | ICD-10-CM | POA: Diagnosis not present

## 2021-01-28 DIAGNOSIS — M25551 Pain in right hip: Secondary | ICD-10-CM | POA: Diagnosis not present

## 2021-01-29 DIAGNOSIS — M1990 Unspecified osteoarthritis, unspecified site: Secondary | ICD-10-CM | POA: Diagnosis not present

## 2021-01-29 DIAGNOSIS — M6281 Muscle weakness (generalized): Secondary | ICD-10-CM | POA: Diagnosis not present

## 2021-01-29 DIAGNOSIS — Z741 Need for assistance with personal care: Secondary | ICD-10-CM | POA: Diagnosis not present

## 2021-01-29 DIAGNOSIS — R262 Difficulty in walking, not elsewhere classified: Secondary | ICD-10-CM | POA: Diagnosis not present

## 2021-01-29 DIAGNOSIS — M25551 Pain in right hip: Secondary | ICD-10-CM | POA: Diagnosis not present

## 2021-01-29 DIAGNOSIS — R2681 Unsteadiness on feet: Secondary | ICD-10-CM | POA: Diagnosis not present

## 2021-01-30 DIAGNOSIS — Z741 Need for assistance with personal care: Secondary | ICD-10-CM | POA: Diagnosis not present

## 2021-01-30 DIAGNOSIS — M25551 Pain in right hip: Secondary | ICD-10-CM | POA: Diagnosis not present

## 2021-01-30 DIAGNOSIS — M6281 Muscle weakness (generalized): Secondary | ICD-10-CM | POA: Diagnosis not present

## 2021-01-30 DIAGNOSIS — M1990 Unspecified osteoarthritis, unspecified site: Secondary | ICD-10-CM | POA: Diagnosis not present

## 2021-01-30 DIAGNOSIS — R262 Difficulty in walking, not elsewhere classified: Secondary | ICD-10-CM | POA: Diagnosis not present

## 2021-01-30 DIAGNOSIS — R2681 Unsteadiness on feet: Secondary | ICD-10-CM | POA: Diagnosis not present

## 2021-01-31 DIAGNOSIS — M6281 Muscle weakness (generalized): Secondary | ICD-10-CM | POA: Diagnosis not present

## 2021-01-31 DIAGNOSIS — M1990 Unspecified osteoarthritis, unspecified site: Secondary | ICD-10-CM | POA: Diagnosis not present

## 2021-01-31 DIAGNOSIS — R262 Difficulty in walking, not elsewhere classified: Secondary | ICD-10-CM | POA: Diagnosis not present

## 2021-01-31 DIAGNOSIS — R2681 Unsteadiness on feet: Secondary | ICD-10-CM | POA: Diagnosis not present

## 2021-01-31 DIAGNOSIS — Z741 Need for assistance with personal care: Secondary | ICD-10-CM | POA: Diagnosis not present

## 2021-01-31 DIAGNOSIS — M25551 Pain in right hip: Secondary | ICD-10-CM | POA: Diagnosis not present

## 2021-02-05 ENCOUNTER — Ambulatory Visit
Admission: RE | Admit: 2021-02-05 | Discharge: 2021-02-05 | Disposition: A | Discharge status: Home / Self Care | Payer: Medicare Other | Source: Ambulatory Visit | Attending: Orthopedic Surgery | Admitting: Orthopedic Surgery

## 2021-02-05 ENCOUNTER — Other Ambulatory Visit: Payer: Medicare Other

## 2021-02-05 ENCOUNTER — Other Ambulatory Visit: Payer: Self-pay

## 2021-02-05 DIAGNOSIS — R262 Difficulty in walking, not elsewhere classified: Secondary | ICD-10-CM | POA: Diagnosis not present

## 2021-02-05 DIAGNOSIS — M25551 Pain in right hip: Secondary | ICD-10-CM | POA: Diagnosis not present

## 2021-02-05 DIAGNOSIS — M6281 Muscle weakness (generalized): Secondary | ICD-10-CM | POA: Diagnosis not present

## 2021-02-05 DIAGNOSIS — R2681 Unsteadiness on feet: Secondary | ICD-10-CM | POA: Diagnosis not present

## 2021-02-05 DIAGNOSIS — M1990 Unspecified osteoarthritis, unspecified site: Secondary | ICD-10-CM | POA: Diagnosis not present

## 2021-02-05 DIAGNOSIS — Z741 Need for assistance with personal care: Secondary | ICD-10-CM | POA: Diagnosis not present

## 2021-02-05 DIAGNOSIS — S32591A Other specified fracture of right pubis, initial encounter for closed fracture: Secondary | ICD-10-CM | POA: Diagnosis not present

## 2021-02-05 DIAGNOSIS — S32401D Unspecified fracture of right acetabulum, subsequent encounter for fracture with routine healing: Secondary | ICD-10-CM | POA: Diagnosis not present

## 2021-02-05 DIAGNOSIS — S32491A Other specified fracture of right acetabulum, initial encounter for closed fracture: Secondary | ICD-10-CM | POA: Diagnosis not present

## 2021-02-05 DIAGNOSIS — S32501A Unspecified fracture of right pubis, initial encounter for closed fracture: Secondary | ICD-10-CM | POA: Diagnosis not present

## 2021-02-06 DIAGNOSIS — Z741 Need for assistance with personal care: Secondary | ICD-10-CM | POA: Diagnosis not present

## 2021-02-06 DIAGNOSIS — R262 Difficulty in walking, not elsewhere classified: Secondary | ICD-10-CM | POA: Diagnosis not present

## 2021-02-06 DIAGNOSIS — M1990 Unspecified osteoarthritis, unspecified site: Secondary | ICD-10-CM | POA: Diagnosis not present

## 2021-02-06 DIAGNOSIS — M6281 Muscle weakness (generalized): Secondary | ICD-10-CM | POA: Diagnosis not present

## 2021-02-06 DIAGNOSIS — R2681 Unsteadiness on feet: Secondary | ICD-10-CM | POA: Diagnosis not present

## 2021-02-06 DIAGNOSIS — M25551 Pain in right hip: Secondary | ICD-10-CM | POA: Diagnosis not present

## 2021-02-07 DIAGNOSIS — M6281 Muscle weakness (generalized): Secondary | ICD-10-CM | POA: Diagnosis not present

## 2021-02-07 DIAGNOSIS — R2681 Unsteadiness on feet: Secondary | ICD-10-CM | POA: Diagnosis not present

## 2021-02-07 DIAGNOSIS — M1990 Unspecified osteoarthritis, unspecified site: Secondary | ICD-10-CM | POA: Diagnosis not present

## 2021-02-07 DIAGNOSIS — Z741 Need for assistance with personal care: Secondary | ICD-10-CM | POA: Diagnosis not present

## 2021-02-07 DIAGNOSIS — M25551 Pain in right hip: Secondary | ICD-10-CM | POA: Diagnosis not present

## 2021-02-07 DIAGNOSIS — R262 Difficulty in walking, not elsewhere classified: Secondary | ICD-10-CM | POA: Diagnosis not present

## 2021-02-08 DIAGNOSIS — M6281 Muscle weakness (generalized): Secondary | ICD-10-CM | POA: Diagnosis not present

## 2021-02-08 DIAGNOSIS — M1990 Unspecified osteoarthritis, unspecified site: Secondary | ICD-10-CM | POA: Diagnosis not present

## 2021-02-08 DIAGNOSIS — M25551 Pain in right hip: Secondary | ICD-10-CM | POA: Diagnosis not present

## 2021-02-08 DIAGNOSIS — Z741 Need for assistance with personal care: Secondary | ICD-10-CM | POA: Diagnosis not present

## 2021-02-08 DIAGNOSIS — R2681 Unsteadiness on feet: Secondary | ICD-10-CM | POA: Diagnosis not present

## 2021-02-08 DIAGNOSIS — R262 Difficulty in walking, not elsewhere classified: Secondary | ICD-10-CM | POA: Diagnosis not present

## 2021-02-11 DIAGNOSIS — R2681 Unsteadiness on feet: Secondary | ICD-10-CM | POA: Diagnosis not present

## 2021-02-11 DIAGNOSIS — R262 Difficulty in walking, not elsewhere classified: Secondary | ICD-10-CM | POA: Diagnosis not present

## 2021-02-11 DIAGNOSIS — M6281 Muscle weakness (generalized): Secondary | ICD-10-CM | POA: Diagnosis not present

## 2021-02-11 DIAGNOSIS — M1990 Unspecified osteoarthritis, unspecified site: Secondary | ICD-10-CM | POA: Diagnosis not present

## 2021-02-11 DIAGNOSIS — M25551 Pain in right hip: Secondary | ICD-10-CM | POA: Diagnosis not present

## 2021-02-11 DIAGNOSIS — Z741 Need for assistance with personal care: Secondary | ICD-10-CM | POA: Diagnosis not present

## 2021-02-14 DIAGNOSIS — S32471A Displaced fracture of medial wall of right acetabulum, initial encounter for closed fracture: Secondary | ICD-10-CM | POA: Diagnosis not present

## 2021-02-15 DIAGNOSIS — R2681 Unsteadiness on feet: Secondary | ICD-10-CM | POA: Diagnosis not present

## 2021-02-15 DIAGNOSIS — Z741 Need for assistance with personal care: Secondary | ICD-10-CM | POA: Diagnosis not present

## 2021-02-15 DIAGNOSIS — M1990 Unspecified osteoarthritis, unspecified site: Secondary | ICD-10-CM | POA: Diagnosis not present

## 2021-02-15 DIAGNOSIS — M6281 Muscle weakness (generalized): Secondary | ICD-10-CM | POA: Diagnosis not present

## 2021-02-15 DIAGNOSIS — R262 Difficulty in walking, not elsewhere classified: Secondary | ICD-10-CM | POA: Diagnosis not present

## 2021-02-15 DIAGNOSIS — M25551 Pain in right hip: Secondary | ICD-10-CM | POA: Diagnosis not present

## 2021-02-18 DIAGNOSIS — R2681 Unsteadiness on feet: Secondary | ICD-10-CM | POA: Diagnosis not present

## 2021-02-18 DIAGNOSIS — M1990 Unspecified osteoarthritis, unspecified site: Secondary | ICD-10-CM | POA: Diagnosis not present

## 2021-02-18 DIAGNOSIS — R262 Difficulty in walking, not elsewhere classified: Secondary | ICD-10-CM | POA: Diagnosis not present

## 2021-02-18 DIAGNOSIS — K219 Gastro-esophageal reflux disease without esophagitis: Secondary | ICD-10-CM | POA: Diagnosis not present

## 2021-02-18 DIAGNOSIS — M25551 Pain in right hip: Secondary | ICD-10-CM | POA: Diagnosis not present

## 2021-02-18 DIAGNOSIS — I509 Heart failure, unspecified: Secondary | ICD-10-CM | POA: Diagnosis not present

## 2021-02-18 DIAGNOSIS — I10 Essential (primary) hypertension: Secondary | ICD-10-CM | POA: Diagnosis not present

## 2021-02-18 DIAGNOSIS — M6281 Muscle weakness (generalized): Secondary | ICD-10-CM | POA: Diagnosis not present

## 2021-02-18 DIAGNOSIS — F341 Dysthymic disorder: Secondary | ICD-10-CM | POA: Diagnosis not present

## 2021-02-18 DIAGNOSIS — Z741 Need for assistance with personal care: Secondary | ICD-10-CM | POA: Diagnosis not present

## 2021-02-19 DIAGNOSIS — R2681 Unsteadiness on feet: Secondary | ICD-10-CM | POA: Diagnosis not present

## 2021-02-19 DIAGNOSIS — M6281 Muscle weakness (generalized): Secondary | ICD-10-CM | POA: Diagnosis not present

## 2021-02-19 DIAGNOSIS — S32401D Unspecified fracture of right acetabulum, subsequent encounter for fracture with routine healing: Secondary | ICD-10-CM | POA: Diagnosis not present

## 2021-02-19 DIAGNOSIS — M25551 Pain in right hip: Secondary | ICD-10-CM | POA: Diagnosis not present

## 2021-02-21 DIAGNOSIS — M25551 Pain in right hip: Secondary | ICD-10-CM | POA: Diagnosis not present

## 2021-02-21 DIAGNOSIS — M6281 Muscle weakness (generalized): Secondary | ICD-10-CM | POA: Diagnosis not present

## 2021-02-21 DIAGNOSIS — R2681 Unsteadiness on feet: Secondary | ICD-10-CM | POA: Diagnosis not present

## 2021-02-21 DIAGNOSIS — S32401D Unspecified fracture of right acetabulum, subsequent encounter for fracture with routine healing: Secondary | ICD-10-CM | POA: Diagnosis not present

## 2021-02-22 DIAGNOSIS — M25551 Pain in right hip: Secondary | ICD-10-CM | POA: Diagnosis not present

## 2021-02-22 DIAGNOSIS — M6281 Muscle weakness (generalized): Secondary | ICD-10-CM | POA: Diagnosis not present

## 2021-02-22 DIAGNOSIS — S32401D Unspecified fracture of right acetabulum, subsequent encounter for fracture with routine healing: Secondary | ICD-10-CM | POA: Diagnosis not present

## 2021-02-22 DIAGNOSIS — R2681 Unsteadiness on feet: Secondary | ICD-10-CM | POA: Diagnosis not present

## 2021-02-25 DIAGNOSIS — M25551 Pain in right hip: Secondary | ICD-10-CM | POA: Diagnosis not present

## 2021-02-25 DIAGNOSIS — Z20828 Contact with and (suspected) exposure to other viral communicable diseases: Secondary | ICD-10-CM | POA: Diagnosis not present

## 2021-02-25 DIAGNOSIS — M6281 Muscle weakness (generalized): Secondary | ICD-10-CM | POA: Diagnosis not present

## 2021-02-25 DIAGNOSIS — S32401D Unspecified fracture of right acetabulum, subsequent encounter for fracture with routine healing: Secondary | ICD-10-CM | POA: Diagnosis not present

## 2021-02-25 DIAGNOSIS — R2681 Unsteadiness on feet: Secondary | ICD-10-CM | POA: Diagnosis not present

## 2021-02-26 DIAGNOSIS — M6281 Muscle weakness (generalized): Secondary | ICD-10-CM | POA: Diagnosis not present

## 2021-02-26 DIAGNOSIS — S32401D Unspecified fracture of right acetabulum, subsequent encounter for fracture with routine healing: Secondary | ICD-10-CM | POA: Diagnosis not present

## 2021-02-26 DIAGNOSIS — R2681 Unsteadiness on feet: Secondary | ICD-10-CM | POA: Diagnosis not present

## 2021-02-26 DIAGNOSIS — M25551 Pain in right hip: Secondary | ICD-10-CM | POA: Diagnosis not present

## 2021-02-28 DIAGNOSIS — R2681 Unsteadiness on feet: Secondary | ICD-10-CM | POA: Diagnosis not present

## 2021-02-28 DIAGNOSIS — S32401D Unspecified fracture of right acetabulum, subsequent encounter for fracture with routine healing: Secondary | ICD-10-CM | POA: Diagnosis not present

## 2021-02-28 DIAGNOSIS — M6281 Muscle weakness (generalized): Secondary | ICD-10-CM | POA: Diagnosis not present

## 2021-02-28 DIAGNOSIS — M25551 Pain in right hip: Secondary | ICD-10-CM | POA: Diagnosis not present

## 2021-03-04 DIAGNOSIS — Z20828 Contact with and (suspected) exposure to other viral communicable diseases: Secondary | ICD-10-CM | POA: Diagnosis not present

## 2021-03-19 DIAGNOSIS — I509 Heart failure, unspecified: Secondary | ICD-10-CM | POA: Diagnosis not present

## 2021-03-19 DIAGNOSIS — R040 Epistaxis: Secondary | ICD-10-CM | POA: Diagnosis not present

## 2021-03-19 DIAGNOSIS — I10 Essential (primary) hypertension: Secondary | ICD-10-CM | POA: Diagnosis not present

## 2021-03-19 DIAGNOSIS — M1990 Unspecified osteoarthritis, unspecified site: Secondary | ICD-10-CM | POA: Diagnosis not present

## 2021-03-19 DIAGNOSIS — F419 Anxiety disorder, unspecified: Secondary | ICD-10-CM | POA: Diagnosis not present

## 2021-04-15 DIAGNOSIS — I10 Essential (primary) hypertension: Secondary | ICD-10-CM | POA: Diagnosis not present

## 2021-04-15 DIAGNOSIS — F0390 Unspecified dementia without behavioral disturbance: Secondary | ICD-10-CM | POA: Diagnosis not present

## 2021-04-15 DIAGNOSIS — R040 Epistaxis: Secondary | ICD-10-CM | POA: Diagnosis not present

## 2021-04-19 ENCOUNTER — Emergency Department (HOSPITAL_BASED_OUTPATIENT_CLINIC_OR_DEPARTMENT_OTHER)
Admission: EM | Admit: 2021-04-19 | Discharge: 2021-04-19 | Disposition: A | Discharge status: Home / Self Care | Payer: Medicare Other | Attending: Emergency Medicine | Admitting: Emergency Medicine

## 2021-04-19 ENCOUNTER — Other Ambulatory Visit: Payer: Self-pay

## 2021-04-19 DIAGNOSIS — E039 Hypothyroidism, unspecified: Secondary | ICD-10-CM | POA: Insufficient documentation

## 2021-04-19 DIAGNOSIS — Z85828 Personal history of other malignant neoplasm of skin: Secondary | ICD-10-CM | POA: Diagnosis not present

## 2021-04-19 DIAGNOSIS — F0390 Unspecified dementia without behavioral disturbance: Secondary | ICD-10-CM | POA: Diagnosis not present

## 2021-04-19 DIAGNOSIS — Z85038 Personal history of other malignant neoplasm of large intestine: Secondary | ICD-10-CM | POA: Insufficient documentation

## 2021-04-19 DIAGNOSIS — R040 Epistaxis: Secondary | ICD-10-CM | POA: Diagnosis not present

## 2021-04-19 DIAGNOSIS — Z85048 Personal history of other malignant neoplasm of rectum, rectosigmoid junction, and anus: Secondary | ICD-10-CM | POA: Diagnosis not present

## 2021-04-19 DIAGNOSIS — I110 Hypertensive heart disease with heart failure: Secondary | ICD-10-CM | POA: Diagnosis not present

## 2021-04-19 DIAGNOSIS — R58 Hemorrhage, not elsewhere classified: Secondary | ICD-10-CM | POA: Diagnosis not present

## 2021-04-19 DIAGNOSIS — I10 Essential (primary) hypertension: Secondary | ICD-10-CM | POA: Diagnosis not present

## 2021-04-19 DIAGNOSIS — I509 Heart failure, unspecified: Secondary | ICD-10-CM | POA: Insufficient documentation

## 2021-04-19 MED ORDER — SILVER NITRATE-POT NITRATE 75-25 % EX MISC
1.0000 "application " | Freq: Once | CUTANEOUS | Status: AC
  Administered 2021-04-19: 1 via TOPICAL
  Filled 2021-04-19: qty 10

## 2021-04-19 MED ORDER — LIDOCAINE-EPINEPHRINE (PF) 2 %-1:200000 IJ SOLN
10.0000 mL | Freq: Once | INTRAMUSCULAR | Status: AC
  Administered 2021-04-19: 10 mL
  Filled 2021-04-19: qty 20

## 2021-04-19 NOTE — ED Triage Notes (Signed)
Pt to Ed from home with c/o epistaxis x 1.5 hours before arrival from both nostrils after pt was using the restroom. Pt not on blood thinners x 2 weeks.

## 2021-04-19 NOTE — ED Notes (Signed)
Care Handoff given to Ava at [facility]. All questions answered.

## 2021-04-19 NOTE — Discharge Instructions (Addendum)

## 2021-04-19 NOTE — ED Notes (Signed)
Reassessment: still no Bleeding at this time.

## 2021-04-19 NOTE — ED Triage Notes (Signed)
Pt to ED from home with c/o epistaxis x 1.5 hour from both nostrils. Pt not on blood thinners x 2 weeks.

## 2021-04-19 NOTE — ED Notes (Signed)
No Answer from [facility] regarding giving Report. This RN will re-attempt at a later time.

## 2021-04-19 NOTE — ED Provider Notes (Signed)
Emergency Department Provider Note   I have reviewed the triage vital signs and the nursing notes.   HISTORY  Chief Complaint Epistaxis   HPI Cindy [name] is a 87 y.o. female with PMH reviewed, NOT on anticoagulation, presents to the ED with 1.5 hours of epistaxis from the left nostril. Notes a brief episode yesterday from the same nostril that resolved. No injury. She has been on anticoagulation recently but off now for several weeks. No SOB but did have some vomiting of blood once after epistaxis started. Denies SOB or CP symptoms.   Past Medical History:  Diagnosis Date  . Arthritis   . Cancer ([place])    skin  . Colon cancer ([place]) 08/31/13   invasive squamous cell  . Deaf, left   . Depression   . GERD (gastroesophageal reflux disease)   . History of radiation therapy 10/10/13-11/23/13   anal canal 54GY/  . Hyperlipemia   . Hypertension   . Hypothyroidism   . PONV (postoperative nausea and vomiting)   . Skin cancer    Shingles 2012 Bad case    Patient Active Problem List   Diagnosis Date Noted  . CHF (congestive heart failure) ([place]) 08/14/2020  . DVT (deep venous thrombosis) ([place]) 07/29/2020  . Pulmonary embolus ([place]) 07/28/2020  . Anemia 07/28/2020  . Pulmonary embolism ([place]) 07/28/2020  . Acute respiratory failure with hypoxia ([place]) 07/28/2020  . Dementia without behavioral disturbance ([place])   . DNR (do not resuscitate)   . Palliative care by specialist   . Adult failure to thrive   . Acetabular fracture ([place]) 07/07/2020  . Sepsis ([place]) 03/09/2017  . Nausea vomiting and diarrhea 03/09/2017  . Elevated troponin 03/09/2017  . Chest pain 12/25/2013  . Dyspnea 12/25/2013  . Restless leg syndrome 12/25/2013  . Mucositis 10/27/2013  . Hypophosphatemia 10/27/2013  . Protein-calorie malnutrition, severe ([place]) 10/25/2013  . Thrombocytopenia ([place]) 10/25/2013  . Hypomagnesemia 10/24/2013  . Febrile neutropenia ([place]) 10/23/2013  . Anal cancer ([place]) 09/09/2013   . Colon cancer ([place]) 08/31/2013  . Colitis, acute 03/17/2013  . Gastroparesis 10/22/2012  . Hand pain, left 10/22/2012  . Anxiety 10/18/2012  . Hyperventilation syndrome 10/18/2012  . Headache(784.0) 10/18/2012  . Neck pain on right side 10/18/2012  . Hypertension 10/18/2012  . GERD (gastroesophageal reflux disease) 10/18/2012  . Hyperlipidemia 10/18/2012  . Insomnia 10/18/2012  . Hearing loss sensory, bilateral 10/18/2012  . 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 Weakness generalized 10/17/2012  . Hyponatremia 10/17/2012  . Hypokalemia 10/17/2012  . Hypothyroidism 10/17/2012  . Fatigue 10/17/2012    Past Surgical History:  Procedure Laterality Date  . ABDOMINAL HYSTERECTOMY    . APPENDECTOMY    . CESAREAN SECTION     2  . CHOLECYSTECTOMY    . COLONOSCOPY  08/31/13   invasive squamous cell colon  . ERCP    . ESOPHAGOGASTRODUODENOSCOPY  10/20/2012   Procedure: ESOPHAGOGASTRODUODENOSCOPY (EGD);  Surgeon: Arta Silence, MD;  Location: [facility];  Service: Endoscopy;  Laterality: Left;  . EYE SURGERY     cataracts  . KNEE ARTHROSCOPY  05/11/2012   Procedure: ARTHROSCOPY KNEE;  Surgeon: Hessie Dibble, MD;  Location: [facility];  Service: Orthopedics;  Laterality: Right;  left knee medial menisectomy and chondroplasty  . THYROIDECTOMY, PARTIAL    . TONSILLECTOMY      Allergies Vancomycin, Zosyn [piperacillin sod-tazobactam so], Hydrocodone, and Morphine and related  Family History  Problem Relation Age of Onset  . Kidney  disease Mother   . Heart attack Father   . Heart disease Father   . Cancer Sister        breast  . Cancer Sister        ovarian  . Cancer Sister        melanoma  . Cancer Daughter        breast    Social History Social History   Tobacco Use  . Smoking status: Never Smoker  . Smokeless tobacco: Never Used  Substance Use Topics  . Alcohol use: No  . Drug use: No    Review of Systems  Constitutional: No  fever/chills Eyes: No visual changes. ENT: No sore throat. Positive epistaxis.  Cardiovascular: Denies chest pain. Respiratory: Denies shortness of breath. Gastrointestinal: No abdominal pain.  No nausea, no vomiting.  No diarrhea.  No constipation. Genitourinary: Negative for dysuria. Musculoskeletal: Negative for back pain. Skin: Negative for rash. Neurological: Negative for headaches, focal weakness or numbness.  10-point ROS otherwise negative.  ____________________________________________   PHYSICAL EXAM:  VITAL SIGNS: ED Triage Vitals  Enc Vitals Group     BP 04/19/21 2026 (!) 177/78     Pulse Rate 04/19/21 2026 (!) 57     Resp 04/19/21 2034 20     Temp 04/19/21 2026 97.7 F (36.5 C)     Temp Source 04/19/21 2026 Oral     SpO2 04/19/21 2026 99 %     Weight 04/19/21 2027 140 lb (63.5 kg)   Constitutional: Alert and oriented. Well appearing and in no acute distress. Eyes: Conjunctivae are normal.  Head: Atraumatic. Nose: Patient blew nosed to remove clot from the left nostril. No active bleeding but left nostril with focal clot along the left nasal septum likely area of bleeding.  Mouth/Throat: Mucous membranes are moist.   Neck: No stridor.   Cardiovascular: Normal rate, regular rhythm. Good peripheral circulation. Grossly normal heart sounds.   Respiratory: Normal respiratory effort.  No retractions. Lungs CTAB. Gastrointestinal: Soft and nontender. No distention.  Musculoskeletal:  No gross deformities of extremities. Neurologic:  Normal speech and language.  Skin:  Skin is warm, dry and intact. No rash noted. ____________________________________________   PROCEDURES  Procedure(s) performed:   .Epistaxis Management  Date/Time: 04/19/2021 9:51 PM Performed by: Margette Fast, MD Authorized by: Margette Fast, MD   Consent:    Consent obtained:  Verbal   Consent given by:  Patient   Risks, benefits, and alternatives were discussed: yes     Risks  discussed:  Infection, bleeding, nasal injury and pain   Alternatives discussed:  No treatment Universal protocol:    Patient identity confirmed:  Verbally with patient Anesthesia:    Anesthesia method:  Topical application   Topical anesthetic:  Lidocaine gel Procedure details:    Treatment site:  L anterior   Treatment method:  Silver nitrate   Treatment complexity:  Limited   Treatment episode: recurring   Post-procedure details:    Assessment:  Bleeding stopped   Procedure completion:  Tolerated well, no immediate complications     ____________________________________________   INITIAL IMPRESSION / ASSESSMENT AND PLAN / ED COURSE  Pertinent labs & imaging results that were available during my care of the patient were reviewed by me and considered in my medical decision making (see chart for details).   Patient presents to the ED with epistaxis. Area noted along nasal septum on the left of likely recent bleeding. After lidocaine w/ epi pledget placed in the nostril  I was able to cauterize this with silver nitrate. Patient tolerated this well. Observed in the ED without re-bleeding. One episode of vomiting blood during epistaxis but no symptoms or history to suspect gi bleeding in addition to epistaxis. Plan for ENT follow up and ED return precautions. Patient to continue using saline nasal spray.    ____________________________________________  FINAL CLINICAL IMPRESSION(S) / ED DIAGNOSES  Final diagnoses:  Epistaxis     MEDICATIONS GIVEN DURING THIS VISIT:  Medications  lidocaine-EPINEPHrine (XYLOCAINE W/EPI) 2 %-1:200000 (PF) injection 10 mL (10 mLs Other Given 04/19/21 2040)  silver nitrate applicators applicator 1 application (1 application Topical Given 04/19/21 2223)     Note:  This document was prepared using Dragon voice recognition software and may include unintentional dictation errors.  Nanda Quinton, MD, [facility] Emergency [facility]    Nick Armel, [facility],  MD 04/20/21 [phone]

## 2021-04-19 NOTE — ED Notes (Signed)
This RN presented the AVS utilizing Teachback Method. Patient verbalizes understanding of Discharge Instructions. Opportunity for Questioning and Answers were provided. Patient Discharged from ED in Personal Wheelchair to [place] with Mother.

## 2021-04-24 DIAGNOSIS — H9190 Unspecified hearing loss, unspecified ear: Secondary | ICD-10-CM | POA: Diagnosis not present

## 2021-04-24 DIAGNOSIS — R040 Epistaxis: Secondary | ICD-10-CM | POA: Diagnosis not present

## 2021-04-24 DIAGNOSIS — H7292 Unspecified perforation of tympanic membrane, left ear: Secondary | ICD-10-CM | POA: Diagnosis not present

## 2021-04-28 ENCOUNTER — Emergency Department (HOSPITAL_COMMUNITY)
Admission: EM | Admit: 2021-04-28 | Discharge: 2021-04-29 | Disposition: A | Discharge status: Home / Self Care | Payer: Medicare Other | Source: Home / Self Care | Attending: Emergency Medicine | Admitting: Emergency Medicine

## 2021-04-28 DIAGNOSIS — R0602 Shortness of breath: Secondary | ICD-10-CM | POA: Insufficient documentation

## 2021-04-28 DIAGNOSIS — Z85038 Personal history of other malignant neoplasm of large intestine: Secondary | ICD-10-CM | POA: Insufficient documentation

## 2021-04-28 DIAGNOSIS — F0390 Unspecified dementia without behavioral disturbance: Secondary | ICD-10-CM | POA: Insufficient documentation

## 2021-04-28 DIAGNOSIS — Z85828 Personal history of other malignant neoplasm of skin: Secondary | ICD-10-CM | POA: Insufficient documentation

## 2021-04-28 DIAGNOSIS — R0600 Dyspnea, unspecified: Secondary | ICD-10-CM | POA: Diagnosis not present

## 2021-04-28 DIAGNOSIS — R0689 Other abnormalities of breathing: Secondary | ICD-10-CM | POA: Diagnosis not present

## 2021-04-28 DIAGNOSIS — I214 Non-ST elevation (NSTEMI) myocardial infarction: Secondary | ICD-10-CM | POA: Diagnosis not present

## 2021-04-28 DIAGNOSIS — R079 Chest pain, unspecified: Secondary | ICD-10-CM | POA: Diagnosis not present

## 2021-04-28 DIAGNOSIS — Z85048 Personal history of other malignant neoplasm of rectum, rectosigmoid junction, and anus: Secondary | ICD-10-CM | POA: Insufficient documentation

## 2021-04-28 DIAGNOSIS — H9192 Unspecified hearing loss, left ear: Secondary | ICD-10-CM | POA: Diagnosis not present

## 2021-04-28 DIAGNOSIS — E039 Hypothyroidism, unspecified: Secondary | ICD-10-CM | POA: Insufficient documentation

## 2021-04-28 DIAGNOSIS — I429 Cardiomyopathy, unspecified: Secondary | ICD-10-CM | POA: Diagnosis not present

## 2021-04-28 DIAGNOSIS — I5043 Acute on chronic combined systolic (congestive) and diastolic (congestive) heart failure: Secondary | ICD-10-CM | POA: Diagnosis not present

## 2021-04-28 DIAGNOSIS — I10 Essential (primary) hypertension: Secondary | ICD-10-CM

## 2021-04-28 DIAGNOSIS — R0789 Other chest pain: Secondary | ICD-10-CM | POA: Insufficient documentation

## 2021-04-28 DIAGNOSIS — Z79899 Other long term (current) drug therapy: Secondary | ICD-10-CM | POA: Insufficient documentation

## 2021-04-28 DIAGNOSIS — I110 Hypertensive heart disease with heart failure: Secondary | ICD-10-CM | POA: Insufficient documentation

## 2021-04-28 DIAGNOSIS — Z20822 Contact with and (suspected) exposure to covid-19: Secondary | ICD-10-CM | POA: Diagnosis not present

## 2021-04-28 DIAGNOSIS — Z66 Do not resuscitate: Secondary | ICD-10-CM | POA: Diagnosis not present

## 2021-04-28 DIAGNOSIS — I509 Heart failure, unspecified: Secondary | ICD-10-CM | POA: Insufficient documentation

## 2021-04-28 DIAGNOSIS — K219 Gastro-esophageal reflux disease without esophagitis: Secondary | ICD-10-CM | POA: Insufficient documentation

## 2021-04-28 DIAGNOSIS — Z7901 Long term (current) use of anticoagulants: Secondary | ICD-10-CM | POA: Insufficient documentation

## 2021-04-29 ENCOUNTER — Emergency Department (HOSPITAL_COMMUNITY): Payer: Medicare Other

## 2021-04-29 ENCOUNTER — Other Ambulatory Visit: Payer: Self-pay

## 2021-04-29 ENCOUNTER — Encounter (HOSPITAL_COMMUNITY): Payer: Self-pay | Admitting: Emergency Medicine

## 2021-04-29 DIAGNOSIS — R079 Chest pain, unspecified: Secondary | ICD-10-CM | POA: Diagnosis not present

## 2021-04-29 DIAGNOSIS — R0602 Shortness of breath: Secondary | ICD-10-CM | POA: Diagnosis not present

## 2021-04-29 DIAGNOSIS — R0789 Other chest pain: Secondary | ICD-10-CM | POA: Diagnosis not present

## 2021-04-29 DIAGNOSIS — I10 Essential (primary) hypertension: Secondary | ICD-10-CM | POA: Diagnosis not present

## 2021-04-29 LAB — TROPONIN I (HIGH SENSITIVITY)
Troponin I (High Sensitivity): 18 ng/L — ABNORMAL HIGH (ref <18)
Troponin I (High Sensitivity): 19 ng/L — ABNORMAL HIGH (ref <18)

## 2021-04-29 LAB — BASIC METABOLIC PANEL
Anion gap: 11 (ref 5–15)
BUN: 18 mg/dL (ref 8–23)
CO2: 19 mmol/L — ABNORMAL LOW (ref 22–32)
Calcium: 9.4 mg/dL (ref 8.9–10.3)
Chloride: 110 mmol/L (ref 98–111)
Creatinine, Ser: 1.26 mg/dL — ABNORMAL HIGH (ref 0.44–1.00)
GFR, Estimated: 41 mL/min — ABNORMAL LOW (ref >60)
Glucose, Bld: 117 mg/dL — ABNORMAL HIGH (ref 70–99)
Potassium: 4 mmol/L (ref 3.5–5.1)
Sodium: 140 mmol/L (ref 135–145)

## 2021-04-29 LAB — PROTIME-INR
INR: 1.1 (ref 0.8–1.2)
Prothrombin Time: 14.5 seconds (ref 11.4–15.2)

## 2021-04-29 LAB — CBC
HCT: 27.5 % — ABNORMAL LOW (ref 36.0–46.0)
Hemoglobin: 8.3 g/dL — ABNORMAL LOW (ref 12.0–15.0)
MCH: 30.6 pg (ref 26.0–34.0)
MCHC: 30.2 g/dL (ref 30.0–36.0)
MCV: 101.5 fL — ABNORMAL HIGH (ref 80.0–100.0)
Platelets: 272 10*3/uL (ref 150–400)
RBC: 2.71 MIL/uL — ABNORMAL LOW (ref 3.87–5.11)
RDW: 13.7 % (ref 11.5–15.5)
WBC: 8 10*3/uL (ref 4.0–10.5)
nRBC: 0 % (ref 0.0–0.2)

## 2021-04-29 MED ORDER — NITROGLYCERIN 0.4 MG SL SUBL
0.4000 mg | SUBLINGUAL_TABLET | SUBLINGUAL | Status: DC | PRN
  Administered 2021-04-29: 0.4 mg via SUBLINGUAL
  Filled 2021-04-29: qty 1

## 2021-04-29 MED ORDER — FENTANYL CITRATE (PF) 100 MCG/2ML IJ SOLN: 50.0000 ug | Freq: Once | INTRAMUSCULAR | Status: DC

## 2021-04-29 MED ORDER — ALPRAZOLAM 0.25 MG PO TABS
0.2500 mg | ORAL_TABLET | Freq: Once | ORAL | Status: AC
  Administered 2021-04-29: 0.25 mg via ORAL
  Filled 2021-04-29: qty 1

## 2021-04-29 MED ORDER — IOHEXOL 350 MG/ML SOLN
100.0000 mL | Freq: Once | INTRAVENOUS | Status: AC | PRN
  Administered 2021-04-29: 100 mL via INTRAVENOUS

## 2021-04-29 NOTE — ED Provider Notes (Signed)
[facility] EMERGENCY DEPARTMENT Provider Note   CSN: [id number] Arrival date & time: 04/28/21  2322     History Chief Complaint  Patient presents with  . Chest Pain    Cindy [name] is a 87 y.o. female.  HPI   Patient presented to the emergency department today for chest pain.  Initially she presented by herself and stated that she had a big meal with her family for Mother's Day.  She stated that shortly afterwards she laid down and about 10 or 15 with after she laid down she had onset of central chest pressure.  She states it is described it as turning into a sharp pain that then radiated towards her back.  States she is never anything like that before.  She states that shortly after she started to feel short of breath.  At the time of my evaluation it has resolved.  She has had no recent fevers or coughs.  She has no other associated symptoms.  No lower extremity swelling.  No falls.  No other trauma.    Past Medical History:  Diagnosis Date  . Arthritis   . Cancer ([place])    skin  . Colon cancer ([place]) 08/31/13   invasive squamous cell  . Deaf, left   . Depression   . GERD (gastroesophageal reflux disease)   . History of radiation therapy 10/10/13-11/23/13   anal canal 54GY/  . Hyperlipemia   . Hypertension   . Hypothyroidism   . PONV (postoperative nausea and vomiting)   . Skin cancer    Shingles 2012 Bad case    Patient Active Problem List   Diagnosis Date Noted  . CHF (congestive heart failure) ([place]) 08/14/2020  . DVT (deep venous thrombosis) ([place]) 07/29/2020  . Pulmonary embolus ([place]) 07/28/2020  . Anemia 07/28/2020  . Pulmonary embolism ([place]) 07/28/2020  . Acute respiratory failure with hypoxia ([place]) 07/28/2020  . Dementia without behavioral disturbance ([place])   . DNR (do not resuscitate)   . Palliative care by specialist   . Adult failure to thrive   . Acetabular fracture ([place]) 07/07/2020  . Sepsis ([place]) 03/09/2017  . Nausea vomiting  and diarrhea 03/09/2017  . Elevated troponin 03/09/2017  . Chest pain 12/25/2013  . Dyspnea 12/25/2013  . Restless leg syndrome 12/25/2013  . Mucositis 10/27/2013  . Hypophosphatemia 10/27/2013  . Protein-calorie malnutrition, severe ([place]) 10/25/2013  . Thrombocytopenia ([place]) 10/25/2013  . Hypomagnesemia 10/24/2013  . Febrile neutropenia ([place]) 10/23/2013  . Anal cancer ([place]) 09/09/2013  . Colon cancer ([place]) 08/31/2013  . Colitis, acute 03/17/2013  . Gastroparesis 10/22/2012  . Hand pain, left 10/22/2012  . Anxiety 10/18/2012  . Hyperventilation syndrome 10/18/2012  . Headache(784.0) 10/18/2012  . Neck pain on right side 10/18/2012  . Hypertension 10/18/2012  . GERD (gastroesophageal reflux disease) 10/18/2012  . Hyperlipidemia 10/18/2012  . Insomnia 10/18/2012  . Hearing loss sensory, bilateral 10/18/2012  . 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 Weakness generalized 10/17/2012  . Hyponatremia 10/17/2012  . Hypokalemia 10/17/2012  . Hypothyroidism 10/17/2012  . Fatigue 10/17/2012    Past Surgical History:  Procedure Laterality Date  . ABDOMINAL HYSTERECTOMY    . APPENDECTOMY    . CESAREAN SECTION     2  . CHOLECYSTECTOMY    . COLONOSCOPY  08/31/13   invasive squamous cell colon  . ERCP    . ESOPHAGOGASTRODUODENOSCOPY  10/20/2012   Procedure: ESOPHAGOGASTRODUODENOSCOPY (EGD);  Surgeon: Arta Silence, MD;  Location: [facility];  Service: Endoscopy;  Laterality: Left;  . EYE SURGERY     cataracts  . KNEE ARTHROSCOPY  05/11/2012   Procedure: ARTHROSCOPY KNEE;  Surgeon: Hessie Dibble, MD;  Location: [facility];  Service: Orthopedics;  Laterality: Right;  left knee medial menisectomy and chondroplasty  . THYROIDECTOMY, PARTIAL    . TONSILLECTOMY       OB History   No obstetric history on file.     Family History  Problem Relation Age of Onset  . Kidney disease Mother   . Heart attack Father   . Heart disease Father   . Cancer  Sister        breast  . Cancer Sister        ovarian  . Cancer Sister        melanoma  . Cancer Daughter        breast    Social History   Tobacco Use  . Smoking status: Never Smoker  . Smokeless tobacco: Never Used  Substance Use Topics  . Alcohol use: No  . Drug use: No    Home Medications Prior to Admission medications   Medication Sig Start Date End Date Taking? Authorizing Provider  acetaminophen (TYLENOL) 325 MG tablet Take 650 mg by mouth every 6 (six) hours as needed for moderate pain.    [provider]  ALPRAZolam Duanne Moron) 0.25 MG tablet Take 1 tablet (0.25 mg total) by mouth at bedtime as needed for anxiety or sleep. 07/30/20   Dwyane Dee, MD  apixaban (ELIQUIS) 5 MG TABS tablet Take 2 tablets (10 mg total) by mouth 2 (two) times daily for 5 days. 07/30/20 08/04/20  Dwyane Dee, MD  apixaban (ELIQUIS) 5 MG TABS tablet Take 1 tablet (5 mg total) by mouth 2 (two) times daily. Starting 8/15 after completes 10 mg BID ending 8/14 Patient taking differently: Take 5 mg by mouth 2 (two) times daily. Pt takes 1 tablet twice daily. 08/05/20   Dwyane Dee, MD  buPROPion (WELLBUTRIN SR) 200 MG 12 hr tablet Take 200 mg by mouth daily.     [provider]  carvedilol (COREG) 6.25 MG tablet Take 1 tablet (6.25 mg total) by mouth 2 (two) times daily. 09/05/20   Martinique, Peter M, MD  docusate sodium (COLACE) 100 MG capsule Take 100 mg by mouth 2 (two) times daily.    [provider]  escitalopram (LEXAPRO) 10 MG tablet Take 10 mg by mouth daily.    [provider]  ibuprofen (ADVIL) 200 MG tablet Take 200 mg by mouth every 6 (six) hours as needed.    [provider]  levothyroxine (SYNTHROID, LEVOTHROID) 88 MCG tablet Take 88 mcg by mouth daily before breakfast.    [provider]  methocarbamol (ROBAXIN) 500 MG tablet Take 500 mg by mouth 3 (three) times daily.    [provider]  ondansetron (ZOFRAN ODT) 4 MG  disintegrating tablet Take 1 tablet (4 mg total) by mouth every 8 (eight) hours as needed for nausea or vomiting. 07/06/17   Sherwood Gambler, MD  pantoprazole (PROTONIX) 40 MG tablet Take 40 mg by mouth daily.    [provider]  polyethylene glycol (MIRALAX / GLYCOLAX) 17 g packet Take 17 g by mouth daily.    [provider]    Allergies    Vancomycin, Zosyn [piperacillin sod-tazobactam so], Hydrocodone, and Morphine and related  Review of Systems   Review of Systems  All other systems reviewed and are negative.  Physical Exam Updated Vital Signs BP (!) 190/73   Pulse 69   Temp 97.7 F (36.5 C) (Oral)   Resp 18   SpO2 98%   Physical Exam Vitals and nursing note reviewed.  Constitutional:      Appearance: She is well-developed.  HENT:     Head: Normocephalic and atraumatic.     Nose: No congestion or rhinorrhea.     Mouth/Throat:     Mouth: Mucous membranes are moist.  Eyes:     Pupils: Pupils are equal, round, and reactive to light.  Cardiovascular:     Rate and Rhythm: Normal rate and regular rhythm.  Pulmonary:     Effort: Pulmonary effort is normal. No respiratory distress.     Breath sounds: No stridor.  Abdominal:     General: There is no distension.  Musculoskeletal:     Cervical back: Normal range of motion.     Right lower leg: No tenderness. No edema.     Left lower leg: No tenderness. No edema.  Skin:    General: Skin is warm and dry.  Neurological:     General: No focal deficit present.     Mental Status: She is alert.     ED Results / Procedures / Treatments   Labs (all labs ordered are listed, but only abnormal results are displayed) Labs Reviewed  BASIC METABOLIC PANEL - Abnormal; Notable for the following components:      Result Value   CO2 19 (*)    Glucose, Bld 117 (*)    Creatinine, Ser 1.26 (*)    GFR, Estimated 41 (*)    All other components within normal limits  CBC - Abnormal; Notable for the following components:    RBC 2.71 (*)    Hemoglobin 8.3 (*)    HCT 27.5 (*)    MCV 101.5 (*)    All other components within normal limits  TROPONIN I (HIGH SENSITIVITY) - Abnormal; Notable for the following components:   Troponin I (High Sensitivity) 18 (*)    All other components within normal limits  TROPONIN I (HIGH SENSITIVITY) - Abnormal; Notable for the following components:   Troponin I (High Sensitivity) 19 (*)    All other components within normal limits  PROTIME-INR    EKG None  Radiology DG Chest 2 View  Result Date: 04/29/2021 CLINICAL DATA:  Chest pain. EXAM: CHEST - 2 VIEW COMPARISON:  Chest x-ray 07/28/2020, CT chest 07/28/2020 FINDINGS: The heart size and mediastinal contours are unchanged. Aortic arch calcifications. Biapical pleural/pulmonary scarring. No focal consolidation. Slightly increased interstitial markings persistent with no overt pulmonary edema. Likely trace bilateral pleural effusions. No pneumothorax. No acute osseous abnormality. Right upper quadrant surgical clips. IMPRESSION: 1. Likely trace bilateral pleural effusions. 2. Otherwise no acute cardiopulmonary abnormality. Electronically Signed   By: Iven Finn M.D.   On: 04/29/2021 00:28    Procedures Procedures   Medications Ordered in ED Medications - No data to display  ED Course  I have reviewed the triage vital signs and the nursing notes.  Pertinent labs & imaging results that were available during my care of the patient were reviewed by me and considered in my medical decision making (see chart for details).    MDM Rules/Calculators/A&P                          Initial thought was to rule out possible aortic dissection versus pulmonary embolus.  However when  the daughter got here the patient had another episode of chest pain.  Her daughter states that she looked like she was similar to when she had panic attacks in the past.  She also states that she did not think that she is getting Xanax like she was post  to at the facility that she is staying at.  A dose of Xanax was given.  Her CT scan resulted as negative and I don't see any obvious abnormalities.  Her troponins Are reassuring. At this time I have low suspicion for dissection, PE, ACS or other emergent causes. Will dc to facility.   Final Clinical Impression(s) / ED Diagnoses Final diagnoses:  None    Rx / DC Orders ED Discharge Orders    None       Ellenore Roscoe, Corene Cornea, MD 04/30/21 [id number]

## 2021-04-29 NOTE — ED Triage Notes (Signed)
Patient reports central chest pain radiating to neck and upper back this evening , no SOB , denies emesis or diaphoresis .

## 2021-04-29 NOTE — ED Notes (Signed)
[facility] notified of pts bp of 190/73

## 2021-04-29 NOTE — ED Notes (Signed)
Pt back from CT scan c/o chest pain  MD notified , Nitro and xanax given per order , repeat EKG done and given to MD

## 2021-04-29 NOTE — ED Notes (Signed)
Pt verbalizes understanding of discharge instructions. Opportunity for questions and answers were provided. Armband removed by staff, pt discharged from the ED.  

## 2021-05-01 ENCOUNTER — Other Ambulatory Visit (HOSPITAL_COMMUNITY): Payer: Medicare Other

## 2021-05-01 ENCOUNTER — Emergency Department (HOSPITAL_COMMUNITY): Payer: Medicare Other

## 2021-05-01 ENCOUNTER — Inpatient Hospital Stay (HOSPITAL_COMMUNITY): Payer: Medicare Other

## 2021-05-01 ENCOUNTER — Encounter (HOSPITAL_COMMUNITY): Payer: Self-pay | Admitting: Student

## 2021-05-01 ENCOUNTER — Other Ambulatory Visit: Payer: Self-pay

## 2021-05-01 ENCOUNTER — Inpatient Hospital Stay (HOSPITAL_COMMUNITY)
Admission: EM | Admit: 2021-05-01 | Discharge: 2021-05-03 | DRG: 280 | Disposition: A | Discharge status: Skilled Nursing Facility | Payer: Medicare Other | Source: Skilled Nursing Facility | Attending: Internal Medicine | Admitting: Internal Medicine

## 2021-05-01 DIAGNOSIS — Z7189 Other specified counseling: Secondary | ICD-10-CM | POA: Diagnosis not present

## 2021-05-01 DIAGNOSIS — F410 Panic disorder [episodic paroxysmal anxiety] without agoraphobia: Secondary | ICD-10-CM | POA: Diagnosis present

## 2021-05-01 DIAGNOSIS — Z515 Encounter for palliative care: Secondary | ICD-10-CM

## 2021-05-01 DIAGNOSIS — Z993 Dependence on wheelchair: Secondary | ICD-10-CM

## 2021-05-01 DIAGNOSIS — F0390 Unspecified dementia without behavioral disturbance: Secondary | ICD-10-CM | POA: Diagnosis present

## 2021-05-01 DIAGNOSIS — E785 Hyperlipidemia, unspecified: Secondary | ICD-10-CM | POA: Diagnosis present

## 2021-05-01 DIAGNOSIS — Z79899 Other long term (current) drug therapy: Secondary | ICD-10-CM | POA: Diagnosis not present

## 2021-05-01 DIAGNOSIS — Z86718 Personal history of other venous thrombosis and embolism: Secondary | ICD-10-CM | POA: Diagnosis not present

## 2021-05-01 DIAGNOSIS — R0602 Shortness of breath: Secondary | ICD-10-CM | POA: Diagnosis not present

## 2021-05-01 DIAGNOSIS — F32A Depression, unspecified: Secondary | ICD-10-CM | POA: Diagnosis present

## 2021-05-01 DIAGNOSIS — I5021 Acute systolic (congestive) heart failure: Secondary | ICD-10-CM | POA: Diagnosis not present

## 2021-05-01 DIAGNOSIS — Z9049 Acquired absence of other specified parts of digestive tract: Secondary | ICD-10-CM | POA: Diagnosis not present

## 2021-05-01 DIAGNOSIS — Z86711 Personal history of pulmonary embolism: Secondary | ICD-10-CM | POA: Diagnosis not present

## 2021-05-01 DIAGNOSIS — Z66 Do not resuscitate: Secondary | ICD-10-CM | POA: Diagnosis present

## 2021-05-01 DIAGNOSIS — Z808 Family history of malignant neoplasm of other organs or systems: Secondary | ICD-10-CM

## 2021-05-01 DIAGNOSIS — H9192 Unspecified hearing loss, left ear: Secondary | ICD-10-CM | POA: Diagnosis present

## 2021-05-01 DIAGNOSIS — R0600 Dyspnea, unspecified: Secondary | ICD-10-CM | POA: Diagnosis not present

## 2021-05-01 DIAGNOSIS — I5043 Acute on chronic combined systolic (congestive) and diastolic (congestive) heart failure: Secondary | ICD-10-CM

## 2021-05-01 DIAGNOSIS — R0902 Hypoxemia: Secondary | ICD-10-CM | POA: Diagnosis not present

## 2021-05-01 DIAGNOSIS — I214 Non-ST elevation (NSTEMI) myocardial infarction: Principal | ICD-10-CM | POA: Diagnosis present

## 2021-05-01 DIAGNOSIS — Z85828 Personal history of other malignant neoplasm of skin: Secondary | ICD-10-CM | POA: Diagnosis not present

## 2021-05-01 DIAGNOSIS — Z90710 Acquired absence of both cervix and uterus: Secondary | ICD-10-CM | POA: Diagnosis not present

## 2021-05-01 DIAGNOSIS — Z888 Allergy status to other drugs, medicaments and biological substances status: Secondary | ICD-10-CM

## 2021-05-01 DIAGNOSIS — Z8249 Family history of ischemic heart disease and other diseases of the circulatory system: Secondary | ICD-10-CM

## 2021-05-01 DIAGNOSIS — I110 Hypertensive heart disease with heart failure: Secondary | ICD-10-CM | POA: Diagnosis present

## 2021-05-01 DIAGNOSIS — Z885 Allergy status to narcotic agent status: Secondary | ICD-10-CM

## 2021-05-01 DIAGNOSIS — I429 Cardiomyopathy, unspecified: Secondary | ICD-10-CM | POA: Diagnosis present

## 2021-05-01 DIAGNOSIS — Z20822 Contact with and (suspected) exposure to covid-19: Secondary | ICD-10-CM | POA: Diagnosis present

## 2021-05-01 DIAGNOSIS — Z79890 Hormone replacement therapy (postmenopausal): Secondary | ICD-10-CM

## 2021-05-01 DIAGNOSIS — Z923 Personal history of irradiation: Secondary | ICD-10-CM

## 2021-05-01 DIAGNOSIS — F0391 Unspecified dementia with behavioral disturbance: Secondary | ICD-10-CM | POA: Diagnosis not present

## 2021-05-01 DIAGNOSIS — K219 Gastro-esophageal reflux disease without esophagitis: Secondary | ICD-10-CM | POA: Diagnosis present

## 2021-05-01 DIAGNOSIS — E039 Hypothyroidism, unspecified: Secondary | ICD-10-CM | POA: Diagnosis present

## 2021-05-01 DIAGNOSIS — F03918 Unspecified dementia, unspecified severity, with other behavioral disturbance: Secondary | ICD-10-CM | POA: Diagnosis present

## 2021-05-01 DIAGNOSIS — I10 Essential (primary) hypertension: Secondary | ICD-10-CM | POA: Diagnosis not present

## 2021-05-01 DIAGNOSIS — Z85048 Personal history of other malignant neoplasm of rectum, rectosigmoid junction, and anus: Secondary | ICD-10-CM

## 2021-05-01 DIAGNOSIS — I5042 Chronic combined systolic (congestive) and diastolic (congestive) heart failure: Secondary | ICD-10-CM

## 2021-05-01 DIAGNOSIS — R062 Wheezing: Secondary | ICD-10-CM | POA: Diagnosis not present

## 2021-05-01 DIAGNOSIS — E890 Postprocedural hypothyroidism: Secondary | ICD-10-CM | POA: Diagnosis present

## 2021-05-01 DIAGNOSIS — Z7901 Long term (current) use of anticoagulants: Secondary | ICD-10-CM

## 2021-05-01 HISTORY — DX: Acute on chronic combined systolic (congestive) and diastolic (congestive) heart failure: I50.43

## 2021-05-01 LAB — COMPREHENSIVE METABOLIC PANEL
ALT: 11 U/L (ref 0–44)
AST: 24 U/L (ref 15–41)
Albumin: 3.4 g/dL — ABNORMAL LOW (ref 3.5–5.0)
Alkaline Phosphatase: 50 U/L (ref 38–126)
Anion gap: 7 (ref 5–15)
BUN: 16 mg/dL (ref 8–23)
CO2: 21 mmol/L — ABNORMAL LOW (ref 22–32)
Calcium: 8.8 mg/dL — ABNORMAL LOW (ref 8.9–10.3)
Chloride: 104 mmol/L (ref 98–111)
Creatinine, Ser: 1.17 mg/dL — ABNORMAL HIGH (ref 0.44–1.00)
GFR, Estimated: 45 mL/min — ABNORMAL LOW (ref >60)
Glucose, Bld: 145 mg/dL — ABNORMAL HIGH (ref 70–99)
Potassium: 3.7 mmol/L (ref 3.5–5.1)
Sodium: 132 mmol/L — ABNORMAL LOW (ref 135–145)
Total Bilirubin: 0.3 mg/dL (ref 0.3–1.2)
Total Protein: 6.9 g/dL (ref 6.5–8.1)

## 2021-05-01 LAB — CBC WITH DIFFERENTIAL/PLATELET
Abs Immature Granulocytes: 0.02 10*3/uL (ref 0.00–0.07)
Basophils Absolute: 0.1 10*3/uL (ref 0.0–0.1)
Basophils Relative: 1 %
Eosinophils Absolute: 0.2 10*3/uL (ref 0.0–0.5)
Eosinophils Relative: 3 %
HCT: 27.4 % — ABNORMAL LOW (ref 36.0–46.0)
Hemoglobin: 8.4 g/dL — ABNORMAL LOW (ref 12.0–15.0)
Immature Granulocytes: 0 %
Lymphocytes Relative: 21 %
Lymphs Abs: 1.6 10*3/uL (ref 0.7–4.0)
MCH: 30.4 pg (ref 26.0–34.0)
MCHC: 30.7 g/dL (ref 30.0–36.0)
MCV: 99.3 fL (ref 80.0–100.0)
Monocytes Absolute: 0.4 10*3/uL (ref 0.1–1.0)
Monocytes Relative: 6 %
Neutro Abs: 5.1 10*3/uL (ref 1.7–7.7)
Neutrophils Relative %: 69 %
Platelets: 284 10*3/uL (ref 150–400)
RBC: 2.76 MIL/uL — ABNORMAL LOW (ref 3.87–5.11)
RDW: 14 % (ref 11.5–15.5)
WBC: 7.3 10*3/uL (ref 4.0–10.5)
nRBC: 0 % (ref 0.0–0.2)

## 2021-05-01 LAB — ECHOCARDIOGRAM COMPLETE
AR max vel: 1.35 cm2
AV Area VTI: 1.48 cm2
AV Area mean vel: 1.43 cm2
AV Mean grad: 4 mmHg
AV Peak grad: 8.2 mmHg
Ao pk vel: 1.43 m/s
Area-P 1/2: 5.84 cm2
Calc EF: 39.2 %
S' Lateral: 3.2 cm
Single Plane A2C EF: 35.7 %
Single Plane A4C EF: 42.5 %

## 2021-05-01 LAB — RESP PANEL BY RT-PCR (FLU A&B, COVID) ARPGX2
Influenza A by PCR: NEGATIVE
Influenza B by PCR: NEGATIVE
SARS Coronavirus 2 by RT PCR: NEGATIVE

## 2021-05-01 LAB — I-STAT VENOUS BLOOD GAS, ED
Acid-base deficit: 2 mmol/L (ref 0.0–2.0)
Bicarbonate: 22.7 mmol/L (ref 20.0–28.0)
Calcium, Ion: 1.14 mmol/L — ABNORMAL LOW (ref 1.15–1.40)
HCT: 27 % — ABNORMAL LOW (ref 36.0–46.0)
Hemoglobin: 9.2 g/dL — ABNORMAL LOW (ref 12.0–15.0)
O2 Saturation: 99 %
Potassium: 3.7 mmol/L (ref 3.5–5.1)
Sodium: 138 mmol/L (ref 135–145)
TCO2: 24 mmol/L (ref 22–32)
pCO2, Ven: 36.3 mmHg — ABNORMAL LOW (ref 44.0–60.0)
pH, Ven: 7.403 (ref 7.250–7.430)
pO2, Ven: 147 mmHg — ABNORMAL HIGH (ref 32.0–45.0)

## 2021-05-01 LAB — BRAIN NATRIURETIC PEPTIDE: B Natriuretic Peptide: 457.9 pg/mL — ABNORMAL HIGH (ref 0.0–100.0)

## 2021-05-01 LAB — HEPARIN LEVEL (UNFRACTIONATED): Heparin Unfractionated: 0.47 IU/mL (ref 0.30–0.70)

## 2021-05-01 LAB — TROPONIN I (HIGH SENSITIVITY)
Troponin I (High Sensitivity): 892 ng/L (ref <18)
Troponin I (High Sensitivity): 1584 ng/L (ref <18)

## 2021-05-01 MED ORDER — HEPARIN BOLUS VIA INFUSION
3000.0000 [IU] | Freq: Once | INTRAVENOUS | Status: AC
  Administered 2021-05-01: 3000 [IU] via INTRAVENOUS
  Filled 2021-05-01: qty 3000

## 2021-05-01 MED ORDER — SODIUM CHLORIDE 0.9% FLUSH
3.0000 mL | Freq: Two times a day (BID) | INTRAVENOUS | Status: DC
  Administered 2021-05-01 – 2021-05-02 (×3): 3 mL via INTRAVENOUS

## 2021-05-01 MED ORDER — CARVEDILOL 6.25 MG PO TABS
6.2500 mg | ORAL_TABLET | Freq: Two times a day (BID) | ORAL | Status: DC
  Administered 2021-05-01 – 2021-05-03 (×5): 6.25 mg via ORAL
  Filled 2021-05-01 (×3): qty 1
  Filled 2021-05-01: qty 2
  Filled 2021-05-01: qty 1

## 2021-05-01 MED ORDER — ACETAMINOPHEN 650 MG RE SUPP: 650.0000 mg | Freq: Four times a day (QID) | RECTAL | Status: DC | PRN

## 2021-05-01 MED ORDER — HYDRALAZINE HCL 20 MG/ML IJ SOLN: 5.0000 mg | INTRAMUSCULAR | Status: DC | PRN

## 2021-05-01 MED ORDER — CLOPIDOGREL BISULFATE 75 MG PO TABS
75.0000 mg | ORAL_TABLET | Freq: Every day | ORAL | Status: DC
  Administered 2021-05-02 – 2021-05-03 (×2): 75 mg via ORAL
  Filled 2021-05-01 (×2): qty 1

## 2021-05-01 MED ORDER — PERFLUTREN LIPID MICROSPHERE
1.0000 mL | INTRAVENOUS | Status: AC | PRN
  Administered 2021-05-01: 2 mL via INTRAVENOUS
  Filled 2021-05-01: qty 10

## 2021-05-01 MED ORDER — ASPIRIN EC 81 MG PO TBEC
81.0000 mg | DELAYED_RELEASE_TABLET | Freq: Every day | ORAL | Status: DC
  Administered 2021-05-02 – 2021-05-03 (×2): 81 mg via ORAL
  Filled 2021-05-01 (×2): qty 1

## 2021-05-01 MED ORDER — DOCUSATE SODIUM 100 MG PO CAPS
100.0000 mg | ORAL_CAPSULE | Freq: Two times a day (BID) | ORAL | Status: DC
  Administered 2021-05-01 – 2021-05-03 (×5): 100 mg via ORAL
  Filled 2021-05-01 (×5): qty 1

## 2021-05-01 MED ORDER — ACETAMINOPHEN 325 MG PO TABS: 650.0000 mg | ORAL_TABLET | Freq: Four times a day (QID) | ORAL | Status: DC | PRN

## 2021-05-01 MED ORDER — BUPROPION HCL ER (SR) 100 MG PO TB12
200.0000 mg | ORAL_TABLET | Freq: Every day | ORAL | Status: DC
  Administered 2021-05-01 – 2021-05-03 (×3): 200 mg via ORAL
  Filled 2021-05-01 (×3): qty 2

## 2021-05-01 MED ORDER — BISACODYL 5 MG PO TBEC: 5.0000 mg | DELAYED_RELEASE_TABLET | Freq: Every day | ORAL | Status: DC | PRN

## 2021-05-01 MED ORDER — FUROSEMIDE 10 MG/ML IJ SOLN
40.0000 mg | Freq: Every day | INTRAMUSCULAR | Status: DC
  Administered 2021-05-01 – 2021-05-02 (×2): 40 mg via INTRAVENOUS
  Filled 2021-05-01 (×2): qty 4

## 2021-05-01 MED ORDER — ASPIRIN 325 MG PO TABS: 325.0000 mg | ORAL_TABLET | Freq: Once | ORAL | Status: DC

## 2021-05-01 MED ORDER — TRAZODONE HCL 50 MG PO TABS
25.0000 mg | ORAL_TABLET | Freq: Every evening | ORAL | Status: DC | PRN
  Administered 2021-05-01: 25 mg via ORAL
  Filled 2021-05-01: qty 1

## 2021-05-01 MED ORDER — POLYETHYLENE GLYCOL 3350 17 G PO PACK: 17.0000 g | PACK | Freq: Every day | ORAL | Status: DC | PRN

## 2021-05-01 MED ORDER — GABAPENTIN 300 MG PO CAPS
300.0000 mg | ORAL_CAPSULE | Freq: Two times a day (BID) | ORAL | Status: DC | PRN
  Administered 2021-05-03: 300 mg via ORAL
  Filled 2021-05-01: qty 1

## 2021-05-01 MED ORDER — ONDANSETRON HCL 4 MG PO TABS: 4.0000 mg | ORAL_TABLET | Freq: Four times a day (QID) | ORAL | Status: DC | PRN

## 2021-05-01 MED ORDER — ASPIRIN 81 MG PO CHEW
324.0000 mg | CHEWABLE_TABLET | Freq: Once | ORAL | Status: AC
  Administered 2021-05-01: 324 mg via ORAL
  Filled 2021-05-01: qty 4

## 2021-05-01 MED ORDER — ONDANSETRON HCL 4 MG/2ML IJ SOLN: 4.0000 mg | Freq: Four times a day (QID) | INTRAMUSCULAR | Status: DC | PRN

## 2021-05-01 MED ORDER — HEPARIN (PORCINE) 25000 UT/250ML-% IV SOLN
750.0000 [IU]/h | INTRAVENOUS | Status: DC
  Administered 2021-05-01 – 2021-05-02 (×2): 750 [IU]/h via INTRAVENOUS
  Filled 2021-05-01 (×2): qty 250

## 2021-05-01 MED ORDER — ASPIRIN 81 MG PO CHEW: 324.0000 mg | CHEWABLE_TABLET | Freq: Once | ORAL | Status: DC

## 2021-05-01 MED ORDER — CLOPIDOGREL BISULFATE 300 MG PO TABS
600.0000 mg | ORAL_TABLET | Freq: Once | ORAL | Status: AC
  Administered 2021-05-01: 600 mg via ORAL
  Filled 2021-05-01: qty 2

## 2021-05-01 MED ORDER — PANTOPRAZOLE SODIUM 40 MG PO TBEC
40.0000 mg | DELAYED_RELEASE_TABLET | Freq: Every day | ORAL | Status: DC
  Administered 2021-05-01 – 2021-05-03 (×3): 40 mg via ORAL
  Filled 2021-05-01 (×3): qty 1

## 2021-05-01 MED ORDER — ESCITALOPRAM OXALATE 10 MG PO TABS
10.0000 mg | ORAL_TABLET | Freq: Every day | ORAL | Status: DC
  Administered 2021-05-01 – 2021-05-02 (×2): 10 mg via ORAL
  Filled 2021-05-01 (×2): qty 1

## 2021-05-01 MED ORDER — ALPRAZOLAM 0.5 MG PO TABS
0.5000 mg | ORAL_TABLET | Freq: Every day | ORAL | Status: DC
  Administered 2021-05-01 – 2021-05-02 (×2): 0.5 mg via ORAL
  Filled 2021-05-01 (×2): qty 1

## 2021-05-01 MED ORDER — LEVOTHYROXINE SODIUM 100 MCG PO TABS
100.0000 ug | ORAL_TABLET | Freq: Every day | ORAL | Status: DC
  Administered 2021-05-01 – 2021-05-03 (×2): 100 ug via ORAL
  Filled 2021-05-01 (×2): qty 1

## 2021-05-01 NOTE — Progress Notes (Signed)
Physical Therapy Evaluation Patient Details Name: Cindy [name] MRN: [id number] DOB: 09-16-1930 Today's Date: 05/01/2021   History of Present Illness  Pt is an 87 y/o female admitted 5/10 secondary to increased SOB and chest pain. Per MD notes to be managed medically. PMH includes R hip fx, HTN, dementia, DVT/PE, CHF.  Clinical Impression  Pt admitted secondary to problem above with deficits below. Pt requiring min A to roll in the bed for repositioning this session. Demonstrated increased pain when performing AROM on R hip, but reports this is baseline. Pt's daughter requesting to defer further mobility to allow pt to rest. Per daughter, pt lives at ALF, and reports staff can provide assist if needed. Was getting PT 2X/week at ALF. Reports pt is normally able to perform transfers independently. Will need to ensure ALF can provide nessary assist; if not, may need to consider SNF. Will continue to follow acutely.      Follow Up Recommendations Home health PT (Return to ALF with resumption of PT as long as they can provide necessary assist)    Equipment Recommendations  Other (comment) (TBD)    Recommendations for Other Services       Precautions / Restrictions Precautions Precautions: Fall Restrictions Weight Bearing Restrictions: No      Mobility  Bed Mobility Overal bed mobility: Needs Assistance Bed Mobility: Rolling Rolling: Min assist         General bed mobility comments: Min A to assist with rolling on stretcher. Daughter reports she wants pt to stay at bed level as pt has not been able to rest.    Transfers                 General transfer comment: pt's daughter requesting to defer.  Ambulation/Gait                Stairs            Wheelchair Mobility    Modified Rankin (Stroke Patients Only)       Balance                                             Pertinent Vitals/Pain Pain Assessment: Faces Faces Pain  Scale: Hurts little more Pain Location: R hip Pain Descriptors / Indicators: Grimacing;Guarding Pain Intervention(s): Limited activity within patient's tolerance;Monitored during session;Repositioned    Home Living Family/patient expects to be discharged to:: Assisted living               Home Equipment: Wheelchair - Insurance claims handler - 2 wheels Additional Comments: Per daughter, pt has been at ALF since being d/c'd from SNF following hip fx.    Prior Function Level of Independence: Needs assistance   Gait / Transfers Assistance Needed: Uses WC at baseline. Daughter reports pt is independent with transfers. Staff can assist if needed. PT sees pt 2X/week  ADL's / Homemaking Assistance Needed: Staff assists with ADLs and IADLs.        Hand Dominance        Extremity/Trunk Assessment   Upper Extremity Assessment Upper Extremity Assessment: Defer to OT evaluation    Lower Extremity Assessment Lower Extremity Assessment: RLE deficits/detail RLE Deficits / Details: Hx of R hip fracture in july of 2021. reports pain which limits mobility    Cervical / Trunk Assessment Cervical / Trunk Assessment: Normal  Communication   Communication: [facility]  Cognition Arousal/Alertness: Awake/alert Behavior During Therapy: WFL for tasks assessed/performed Overall Cognitive Status: History of cognitive impairments - at baseline                                 General Comments: Dementia at baseline      General Comments General comments (skin integrity, edema, etc.): Pt's daughter present throughout session    Exercises     Assessment/Plan    PT Assessment Patient needs continued PT services  PT Problem List Decreased strength;Decreased balance;Decreased mobility;Decreased activity tolerance;Decreased knowledge of use of DME;Decreased knowledge of precautions;Pain       PT Treatment Interventions DME instruction;Gait training;Stair training;Therapeutic  activities;Functional mobility training;Therapeutic exercise;Balance training;Patient/family education    PT Goals (Current goals can be found in the Care Plan section)  Acute Rehab PT Goals Patient Stated Goal: for pt to get some rest per daughter PT Goal Formulation: With patient/family Time For Goal Achievement: 05/15/21 Potential to Achieve Goals: Good    Frequency Min 3X/week   Barriers to discharge        Co-evaluation               AM-PAC PT "6 Clicks" Mobility  Outcome Measure Help needed turning from your back to your side while in a flat bed without using bedrails?: A Little Help needed moving from lying on your back to sitting on the side of a flat bed without using bedrails?: A Little Help needed moving to and from a bed to a chair (including a wheelchair)?: A Lot Help needed standing up from a chair using your arms (e.g., wheelchair or bedside chair)?: A Lot Help needed to walk in hospital room?: Total Help needed climbing 3-5 steps with a railing? : Total 6 Click Score: 12    End of Session Equipment Utilized During Treatment: Gait belt Activity Tolerance: Patient limited by fatigue Patient left: in bed;with call bell/phone within reach;with family/visitor present (on stretcher in ED) Nurse Communication: Mobility status PT Visit Diagnosis: Difficulty in walking, not elsewhere classified (R26.2)    Time: 1696-7893 PT Time Calculation (min) (ACUTE ONLY): 13 min   Charges:   PT Evaluation $PT Eval Moderate Complexity: 1 Mod          Lou Miner, DPT  Acute Rehabilitation Services  Pager: [phone] Office: [phone]   Cindy [name] 05/01/2021, 12:45 PM

## 2021-05-01 NOTE — ED Notes (Signed)
Critical troponin 892

## 2021-05-01 NOTE — Progress Notes (Signed)
  Echocardiogram 2D Echocardiogram has been performed.  Merrie Roof F 05/01/2021, 2:35 PM

## 2021-05-01 NOTE — ED Provider Notes (Signed)
[place] [facility] EMERGENCY DEPARTMENT Provider Note   CSN: [id number] Arrival date & time: 05/01/21  0308     History Chief Complaint  Patient presents with  . Shortness of Breath  . Respiratory Distress    Cindy [name] is a 87 y.o. female with a hx of hypertension, hyperlipidemia, hypothyroidism, gastroparesis, dementia, DVT/PE (no longer anticoagulated), and CHF with last EF of 35 to 40% who presents to the ED via EMS with complaints of dyspnea that worsened tonight. Patient states that she has had some intermittent shortness of breath & chest pain for the past few days, dyspnea worse when lying down, sxs acutely worsened tonight while with family prompting EMS call. Per EMS they were told patient was having a panic attack with increased work of breathing and chest discomfort. On their arrival SpO2 88% on RA- applied 10L NRB with improvement when in upright position. Patient relays some intermittent mild chest discomfort- none at present. Denies fever, chills, cough, or leg swelling.   HPI     Past Medical History:  Diagnosis Date  . Arthritis   . Cancer ([place])    skin  . Colon cancer ([place]) 08/31/13   invasive squamous cell  . Deaf, left   . Depression   . GERD (gastroesophageal reflux disease)   . History of radiation therapy 10/10/13-11/23/13   anal canal 54GY/  . Hyperlipemia   . Hypertension   . Hypothyroidism   . PONV (postoperative nausea and vomiting)   . Skin cancer    Shingles 2012 Bad case    Patient Active Problem List   Diagnosis Date Noted  . CHF (congestive heart failure) ([place]) 08/14/2020  . DVT (deep venous thrombosis) ([place]) 07/29/2020  . Pulmonary embolus ([place]) 07/28/2020  . Anemia 07/28/2020  . Pulmonary embolism ([place]) 07/28/2020  . Acute respiratory failure with hypoxia ([place]) 07/28/2020  . Dementia without behavioral disturbance ([place])   . DNR (do not resuscitate)   . Palliative care by specialist   . Adult failure to thrive   .  Acetabular fracture ([place]) 07/07/2020  . Sepsis ([place]) 03/09/2017  . Nausea vomiting and diarrhea 03/09/2017  . Elevated troponin 03/09/2017  . Chest pain 12/25/2013  . Dyspnea 12/25/2013  . Restless leg syndrome 12/25/2013  . Mucositis 10/27/2013  . Hypophosphatemia 10/27/2013  . Protein-calorie malnutrition, severe ([place]) 10/25/2013  . Thrombocytopenia ([place]) 10/25/2013  . Hypomagnesemia 10/24/2013  . Febrile neutropenia ([place]) 10/23/2013  . Anal cancer ([place]) 09/09/2013  . Colon cancer ([place]) 08/31/2013  . Colitis, acute 03/17/2013  . Gastroparesis 10/22/2012  . Hand pain, left 10/22/2012  . Anxiety 10/18/2012  . Hyperventilation syndrome 10/18/2012  . Headache(784.0) 10/18/2012  . Neck pain on right side 10/18/2012  . Hypertension 10/18/2012  . GERD (gastroesophageal reflux disease) 10/18/2012  . Hyperlipidemia 10/18/2012  . Insomnia 10/18/2012  . Hearing loss sensory, bilateral 10/18/2012  . 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 Weakness generalized 10/17/2012  . Hyponatremia 10/17/2012  . Hypokalemia 10/17/2012  . Hypothyroidism 10/17/2012  . Fatigue 10/17/2012    Past Surgical History:  Procedure Laterality Date  . ABDOMINAL HYSTERECTOMY    . APPENDECTOMY    . CESAREAN SECTION     2  . CHOLECYSTECTOMY    . COLONOSCOPY  08/31/13   invasive squamous cell colon  . ERCP    . ESOPHAGOGASTRODUODENOSCOPY  10/20/2012   Procedure: ESOPHAGOGASTRODUODENOSCOPY (EGD);  Surgeon: Arta Silence, MD;  Location: [facility];  Service: Endoscopy;  Laterality: Left;  . EYE  SURGERY     cataracts  . KNEE ARTHROSCOPY  05/11/2012   Procedure: ARTHROSCOPY KNEE;  Surgeon: Hessie Dibble, MD;  Location: [facility];  Service: Orthopedics;  Laterality: Right;  left knee medial menisectomy and chondroplasty  . THYROIDECTOMY, PARTIAL    . TONSILLECTOMY       OB History   No obstetric history on file.     Family History  Problem Relation Age of Onset  .  Kidney disease Mother   . Heart attack Father   . Heart disease Father   . Cancer Sister        breast  . Cancer Sister        ovarian  . Cancer Sister        melanoma  . Cancer Daughter        breast    Social History   Tobacco Use  . Smoking status: Never Smoker  . Smokeless tobacco: Never Used  Substance Use Topics  . Alcohol use: No  . Drug use: No    Home Medications Prior to Admission medications   Medication Sig Start Date End Date Taking? Authorizing Provider  acetaminophen (TYLENOL) 325 MG tablet Take 650 mg by mouth every 6 (six) hours as needed for moderate pain.    [provider]  ALPRAZolam Duanne Moron) 0.25 MG tablet Take 1 tablet (0.25 mg total) by mouth at bedtime as needed for anxiety or sleep. 07/30/20   Dwyane Dee, MD  apixaban (ELIQUIS) 5 MG TABS tablet Take 2 tablets (10 mg total) by mouth 2 (two) times daily for 5 days. 07/30/20 08/04/20  Dwyane Dee, MD  apixaban (ELIQUIS) 5 MG TABS tablet Take 1 tablet (5 mg total) by mouth 2 (two) times daily. Starting 8/15 after completes 10 mg BID ending 8/14 Patient taking differently: Take 5 mg by mouth 2 (two) times daily. Pt takes 1 tablet twice daily. 08/05/20   Dwyane Dee, MD  buPROPion (WELLBUTRIN SR) 200 MG 12 hr tablet Take 200 mg by mouth daily.     [provider]  carvedilol (COREG) 6.25 MG tablet Take 1 tablet (6.25 mg total) by mouth 2 (two) times daily. 09/05/20   Martinique, Peter M, MD  docusate sodium (COLACE) 100 MG capsule Take 100 mg by mouth 2 (two) times daily.    [provider]  escitalopram (LEXAPRO) 10 MG tablet Take 10 mg by mouth daily.    [provider]  ibuprofen (ADVIL) 200 MG tablet Take 200 mg by mouth every 6 (six) hours as needed.    [provider]  levothyroxine (SYNTHROID, LEVOTHROID) 88 MCG tablet Take 88 mcg by mouth daily before breakfast.    [provider]  methocarbamol (ROBAXIN) 500 MG tablet Take 500 mg by mouth 3 (three)  times daily.    [provider]  ondansetron (ZOFRAN ODT) 4 MG disintegrating tablet Take 1 tablet (4 mg total) by mouth every 8 (eight) hours as needed for nausea or vomiting. 07/06/17   Sherwood Gambler, MD  pantoprazole (PROTONIX) 40 MG tablet Take 40 mg by mouth daily.    [provider]  polyethylene glycol (MIRALAX / GLYCOLAX) 17 g packet Take 17 g by mouth daily.    [provider]    Allergies    Vancomycin, Zosyn [piperacillin sod-tazobactam so], Hydrocodone, and Morphine and related  Review of Systems   Review of Systems  Constitutional: Negative for chills and fever.  Respiratory: Positive for shortness of breath. Negative for  cough.   Cardiovascular: Positive for chest pain. Negative for leg swelling.  Gastrointestinal: Negative for abdominal pain, diarrhea, nausea and vomiting.  Neurological: Negative for syncope.  All other systems reviewed and are negative.   Physical Exam Updated Vital Signs BP 139/69   Pulse (!) 56   Temp (!) 97.1 F (36.2 C) (Temporal)   Resp (!) 30   SpO2 99%   Physical Exam Vitals and nursing note reviewed.  Constitutional:      General: She is not in acute distress.    Appearance: She is not toxic-appearing.  HENT:     Head: Normocephalic and atraumatic.  Cardiovascular:     Rate and Rhythm: Normal rate and regular rhythm.     Comments: 2+ symmetric radial & DP pulses.  Pulmonary:     Comments: Patient saturating 100% on NRB.  Bibasilar coarse breath sounds noted.   Abdominal:     Palpations: Abdomen is soft.     Tenderness: There is no abdominal tenderness. There is no guarding or rebound.  Musculoskeletal:     Cervical back: Neck supple.     Right lower leg: No edema.     Left lower leg: No edema.  Skin:    General: Skin is warm and dry.  Neurological:     Comments: Clear speech.   Psychiatric:        Mood and Affect: Mood normal.    ED Results / Procedures / Treatments   Labs (all labs ordered  are listed, but only abnormal results are displayed) Labs Reviewed  COMPREHENSIVE METABOLIC PANEL - Abnormal; Notable for the following components:      Result Value   Sodium 132 (*)    CO2 21 (*)    Glucose, Bld 145 (*)    Creatinine, Ser 1.17 (*)    Calcium 8.8 (*)    Albumin 3.4 (*)    GFR, Estimated 45 (*)    All other components within normal limits  CBC WITH DIFFERENTIAL/PLATELET - Abnormal; Notable for the following components:   RBC 2.76 (*)    Hemoglobin 8.4 (*)    HCT 27.4 (*)    All other components within normal limits  I-STAT VENOUS BLOOD GAS, ED - Abnormal; Notable for the following components:   pCO2, Ven 36.3 (*)    pO2, Ven 147.0 (*)    Calcium, Ion 1.14 (*)    HCT 27.0 (*)    Hemoglobin 9.2 (*)    All other components within normal limits  TROPONIN I (HIGH SENSITIVITY) - Abnormal; Notable for the following components:   Troponin I (High Sensitivity) 892 (*)    All other components within normal limits  RESP PANEL BY RT-PCR (FLU A&B, COVID) ARPGX2  BRAIN NATRIURETIC PEPTIDE  TROPONIN I (HIGH SENSITIVITY)    EKG EKG Interpretation  Date/Time:  Wednesday May 01 2021 03:12:39 EDT Ventricular Rate:  64 PR Interval:  164 QRS Duration: 97 QT Interval:  441 QTC Calculation: 455 R Axis:   26 Text Interpretation: Sinus rhythm LVH with secondary repolarization abnormality Anterior Q waves, possibly due to LVH No significant change since last tracing Confirmed by Ripley Fraise [phone]) on 05/01/2021 3:21:01 AM   Radiology DG Chest Portable 1 View  Result Date: 05/01/2021 CLINICAL DATA:  Dyspnea. EXAM: PORTABLE CHEST 1 VIEW COMPARISON:  Chest x-ray 04/29/2021, CT chest 04/29/2021 FINDINGS: The heart size and mediastinal contours are unchanged. Referral hazy airspace opacities. No pulmonary edema. No pleural effusion. No pneumothorax. No acute osseous abnormality. IMPRESSION:  Peripheral hazy airspace opacities. COVID-19 infection not excluded. Electronically  Signed   By: Iven Finn M.D.   On: 05/01/2021 03:50   CT Angio Chest/Abd/Pel for Dissection W and/or Wo Contrast  Result Date: 04/29/2021 CLINICAL DATA:  87 year old female with chest pain, chest heaviness, shortness of breath since last night. Prior history of rectal cancer. EXAM: CT ANGIOGRAPHY CHEST, ABDOMEN AND PELVIS TECHNIQUE: Initial noncontrast CT of the chest provided. Multidetector CT imaging through the chest, abdomen and pelvis was performed using the standard protocol during bolus administration of intravenous contrast. Multiplanar reconstructed images and MIPs were obtained and reviewed to evaluate the vascular anatomy. CONTRAST:  166mL OMNIPAQUE IOHEXOL 350 MG/ML SOLN COMPARISON:  CTA chest 07/28/2020. CT Abdomen and Pelvis 07/06/2017. Right hip CT 02/05/2021. FINDINGS: CTA CHEST FINDINGS Cardiovascular: Calcified coronary artery atherosclerosis. Thoracic aortic atherosclerosis, but no thoracic aortic aneurysm or dissection. Tortuous proximal great vessels appear patent. Central pulmonary arteries also well opacified. No pulmonary artery filling defect identified. Borderline to mild cardiomegaly.  No pericardial effusion. Mediastinum/Nodes: No mediastinal mass or lymphadenopathy. Lungs/Pleura: Resolved pleural effusions since August. Improved lung volumes and ventilation. Major airways are patent. Mild left upper lobe mosaic attenuation near the lingula felt related to gas trapping. Musculoskeletal: No acute osseous abnormality identified. Review of the MIP images confirms the above findings. CTA ABDOMEN AND PELVIS FINDINGS VASCULAR Negative for abdominal aortic aneurysm or dissection. Mild to moderate for age Aortoiliac calcified atherosclerosis. Major arterial branches in the abdomen and pelvis remain patent. Review of the MIP images confirms the above findings. NON-VASCULAR Hepatobiliary: Chronically absent gallbladder.  Negative liver. Pancreas: Negative. Spleen: Negative. Adrenals/Urinary  Tract: Normal adrenal glands. Nonobstructed kidneys with symmetric renal enhancement appear stable since 2018. No nephrolithiasis. Negative ureters. Incidental pelvic phleboliths. Decompressed bladder. Chronic space of Retzius nodularity is stable since 2018 and might be posttraumatic or post treatment related. Stomach/Bowel: No dilated large or small bowel. Large bowel diverticulosis most concentrated in the sigmoid. No active inflammation. Diminutive or absent appendix. Cecum partially located in the pelvis. Negative terminal ileum. Negative stomach and duodenum. No free air, free fluid. Lymphatic: No lymphadenopathy. Reproductive: Surgically absent as before. Other: No pelvic free fluid. Musculoskeletal: Comminuted, unhealed fracture of the right acetabulum with a degree of acetabular protrusio appears stable since February. Chronic appearing deformity of the right pubic symphysis is stable, new since 2018. No new osseous abnormality identified. Review of the MIP images confirms the above findings. IMPRESSION: 1. Normal aorta aside from Atherosclerosis (ICD10-I70.0). Pulmonary arteries are also well opacified and negative for PE. 2. No acute or inflammatory process identified in the chest, abdomen, or pelvis. 3. Comminuted and unhealed right acetabular fracture stable since February. Electronically Signed   By: Genevie Ann M.D.   On: 04/29/2021 07:08    Procedures .Critical Care Performed by: Amaryllis Dyke, PA-C Authorized by: Amaryllis Dyke, PA-C    CRITICAL CARE Performed by: Kennith Maes   Total critical care time: 35 minutes  Critical care time was exclusive of separately billable procedures and treating other patients.  Critical care was necessary to treat or prevent imminent or life-threatening deterioration.  Critical care was time spent personally by me on the following activities: development of treatment plan with patient and/or surrogate as well as nursing,  discussions with consultants, evaluation of patient's response to treatment, examination of patient, obtaining history from patient or surrogate, ordering and performing treatments and interventions, ordering and review of laboratory studies, ordering and review of radiographic studies, pulse oximetry and  re-evaluation of patient's condition.    Medications Ordered in ED Medications  heparin ADULT infusion 100 units/mL (25000 units/260mL) (750 Units/hr Intravenous New Bag/Given 05/01/21 0548)  aspirin chewable tablet 324 mg (324 mg Oral Given 05/01/21 0508)  heparin bolus via infusion 3,000 Units (3,000 Units Intravenous Bolus from Bag 05/01/21 0549)    ED Course  I have reviewed the triage vital signs and the nursing notes.  Pertinent labs & imaging results that were available during my care of the patient were reviewed by me and considered in my medical decision making (see chart for details).    MDM Rules/Calculators/A&P                         Patient presents to the ED with complaints of dyspnea. Nontoxic, elderly female on NRB with 100% SpO2, does not appear to be tachypneic into the 30s as documented. Plan to transition from NRB to [place]. Coarse breaths sounds present.   DDX: CHF, ACS, pneumonia, pneumothorax, critical anemia, PE.   Additional history obtained:  Additional history obtained from chart review & nursing note review.  August 2021- she underwent CTA chest in the ER and was found to have bilateral pulmonary emboli involving the right middle and lobar arteries as well as the right and left upper lobes (segmental branches), heparinized--> eliquis.  Seen in the ED for similar sxs 04/29/21- CTA @ that time 1. Normal aorta aside from Atherosclerosis (ICD10-I70.0). Pulmonary arteries are also well opacified and negative for PE. 2. No acute or inflammatory process identified in the chest, abdomen, or pelvis. 3. Comminuted and unhealed right acetabular fracture stable since  February.  EKG: Sinus rhythm LVH with secondary repolarization abnormality Anterior Q waves, possibly due to LVH No significant change since last tracing   Lab Tests:  I Ordered, reviewed, and interpreted labs, which included:  VBG: No significant respiratory acidosis.  CBC: Anemia CMP: Renal function similar to most recent, slightly increased from prior though.  Troponin: Significantly elevated @ 892.   Imaging Studies ordered:  I ordered imaging studies which included CXR, I independently reviewed, formal radiology impression shows: Peripheral hazy airspace opacities. COVID-19 infection not excluded.  ED Course:  Troponin significantly elevated @ 892- acute elevation since recent ED visit for similar, EKG appears fairly similar to most recent on record, no active chest pain on re-assessment of the patient- she is quite sleepy now, but is arousible and able to answer my questions.  We discussed with patient's daughter, relays patient received xanax @ 8PM, had this episode a few hours later and was then given gabapentin which could be accounting for some of her drowsiness now, she has had similar sxs previously. Her supplemental oxygen was turned off, maintaining SpO2 @ 96%. Concern for NSTEMI, considered PE- patient with CTA negative for PE within the past 48 hours with similar sxs & with reassuring RA SPO2 at this time.   05:03: CONSULT: Myself & attending Dr. Christy Gentles discussed with Dr. Hassell Done, on call for cardiology, will see in consultation, recommends admission to medicine, patient previously deemed not to be a candidate for further more invasive intervention, okay to start heparin.--> Patient seen by cardiology, plan for heparin, conservative management, admit to medicine.   05:55: CONSULT: Discussed with hospitalist Dr. Nevada Crane- accepts admission.   This is a shared visit with supervising physician Dr. Christy Gentles who has independently evaluated patient & provided guidance in  evaluation/management/disposition, in agreement with care   Portions of this  note were generated with [employer]. Dictation errors may occur despite best attempts at proofreading.  Final Clinical Impression(s) / ED Diagnoses Final diagnoses:  NSTEMI (non-ST elevated myocardial infarction) [facility])    Rx / DC Orders ED Discharge Orders    None       Amaryllis Dyke, PA-C 05/01/21 0603    Ripley Fraise, MD 05/01/21 [phone]

## 2021-05-01 NOTE — Consult Note (Signed)
Consultation Note Date: 05/01/2021   Patient Name: Cindy [name]  DOB: 1930-01-22  MRN: [id number]  Age / Sex: 87 y.o., female  PCP: Clovia Cuff, MD Referring Physician: Karmen Bongo, MD  Reason for Consultation: Establishing goals of care  HPI/Patient Profile: 87 y.o. 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  She presented to the emergency department on 05/01/2021 with worsening dyspnea, cough, and associated shortness of breath. Per family's report, these symptoms seemed to be consistent with prior panic attacks. On EMS arrival, spo2 was 88% with increased work of breathing and chest discomfort. She was placed on NRB with some improvement in symptoms.  ED Course: high-sensitivity troponin found to be elevated at 892>>1584. EKG stable compared to prior from 04/29/21. BNP noted to be elevated at 457. Chest x-ray showing peripheral hazy airspace opacities. Creatinine stable at 1.17 which appears to be baseline.  Per cardiology, plan is for IV heparin out of concern for ACS with no plans for invasive cardiac work-up.    Clinical Assessment and Goals of Care: I have reviewed medical records including EPIC notes, labs and imaging, and examined the patient. She is currently resting/sleeping comfortably. Daughter/Pam is at bedside and agreeable to discuss diagnosis, prognosis, GOC, EOL wishes, disposition, and options.  Patient is known to PMT from her hospitalization in July 2021. I reviewed Palliative Medicine is specialized medical care for people living with serious illness. It focuses on providing relief from the symptoms and stress of a serious illness.   We discussed a brief life review of the patient. She is from the [place] area. She has 4 children: daughters  Joella Prince, and [place], and son Franchot Mimes. Mariella Saa and Pam are from her first marriage, while [place] and Franchot Mimes are from her second marriage. She divorced her first 2 husbands and her third husband passed away approximately 15 years ago.    Pam shares that her mother's functional status has declined since she broke her hip in July 2021. She has been non-ambulatory since that time. She is wheelchair bound and able to roll herself around. She requires assistance with bathing and dressing, but is able to feed herself.   We discussed her current illness and what it means in the larger context of her ongoing co-morbidities.  We discussed that dementia is a progressive, non-curable disease underlying the patient's current acute medical conditions. We reviewed specific indicators of end stage dementia, including inability to communicate, bed bound/non-ambulatory status, decreased oral intake, and incontinence of bowel/bladder.  I attempted to elicit values and goals of care important to the patient. Pam shares that her mother's greatest joy in life is her children. She describes her as "over the top" with regard to being a mother. She also loves to garden and loves to sew/crochet. Pam shares that her mother has always been very independent, and that the loss of her independence has been very difficult.     The difference between aggressive medical intervention  and comfort care was considered in light of the patient's goals of care.  I introduced the concept of a comfort path to emphasizing it means stopping full scope medical interventions with the goal of comfort rather than prolonging life.  Introduced hospice philosophy and let Pam know hospice services could help support patient in an assisted living facility or long-term care facility. Pam shares she has always thought of hospice as providing care "at the very end". I provided education and counseling that hospice services can be utilized for patients that have a  prognosis of 6 months or less. Discussed that hospice could help provide symptom management for her anxiety and panic attacks.   There is a MOST on file in [place] from September 2020. The following treatment decisions are outlined:  Cardiopulmonary Resuscitation: Do Not Attempt Resuscitation (DNR/No CPR)  Medical Interventions: Limited Additional Interventions: Use medical treatment, IV fluids and cardiac monitoring as indicated, DO NOT USE intubation or mechanical ventilation. May consider use of less invasive airway support such as BiPAP or CPAP. Also provide comfort measures. Transfer to the hospital if indicated. Avoid intensive care.   Antibiotics: Antibiotics if indicated  IV Fluids: IV fluids if indicated  Feeding Tube: No feeding tube   Questions and concerns were addressed. I provided a copy of "hard choices" book and PMT contact card - family was encouraged to call with questions or concerns.    Primary decision maker: Chauncey Reading is daughter Jerl Mina (this document is on file in [place])    SUMMARY OF RECOMMENDATIONS    DNR/DNI as previously documented  Continue current medical care  Patient would be eligible and appropriate to receive hospice services - will discuss with HCPOA/daughter Sherian tomorrow  PMT will continue to follow  Code Status/Advance Care Planning:  DNR  Symptom Management:   Alprazolam (Xanax) 0.5 mg at bedtime  Palliative Prophylaxis:   Delirium Protocol and Turn Reposition  Additional Recommendations (Limitations, Scope, Preferences):  No Artificial Feeding and No Tracheostomy  Psycho-social/Spiritual:   Created space and opportunity for family to express thoughts and feelings regarding patient's current medical situation.   Emotional support provided   Prognosis:   < 6 months would not be surprising  Discharge Planning: To Be Determined      Primary Diagnoses: Present on Admission: . NSTEMI (non-ST elevated myocardial  infarction) ([place])   I have reviewed the medical record, interviewed the patient and family, and examined the patient. The following aspects are pertinent.  Past Medical History:  Diagnosis Date  . Arthritis   . Cancer ([place])    skin  . Colon cancer ([place]) 08/31/13   invasive squamous cell  . Deaf, left   . Depression   . GERD (gastroesophageal reflux disease)   . History of radiation therapy 10/10/13-11/23/13   anal canal 54GY/  . Hyperlipemia   . Hypertension   . Hypothyroidism   . PONV (postoperative nausea and vomiting)   . Skin cancer    Shingles 2012 Bad case    Family History  Problem Relation Age of Onset  . Kidney disease Mother   . Heart attack Father   . Heart disease Father   . Cancer Sister        breast  . Cancer Sister        ovarian  . Cancer Sister        melanoma  . Cancer Daughter        breast   Scheduled Meds: . ALPRAZolam  0.5 mg Oral  QHS  . buPROPion  200 mg Oral Daily  . carvedilol  6.25 mg Oral BID  . docusate sodium  100 mg Oral BID  . escitalopram  10 mg Oral Daily  . furosemide  40 mg Intravenous Daily  . levothyroxine  100 mcg Oral Daily  . pantoprazole  40 mg Oral Daily  . sodium chloride flush  3 mL Intravenous Q12H   Continuous Infusions: . heparin 750 Units/hr (05/01/21 0548)   PRN Meds:.acetaminophen **OR** acetaminophen, bisacodyl, gabapentin, hydrALAZINE, ondansetron **OR** ondansetron (ZOFRAN) IV, polyethylene glycol, traZODone Medications Prior to Admission:  Prior to Admission medications   Medication Sig Start Date End Date Taking? Authorizing Provider  acetaminophen (TYLENOL) 325 MG tablet Take 650 mg by mouth 3 (three) times daily as needed for moderate pain.   Yes [provider]  ALPRAZolam Duanne Moron) 0.5 MG tablet Take 0.5 mg by mouth at bedtime.   Yes [provider]  buPROPion (WELLBUTRIN SR) 200 MG 12 hr tablet Take 200 mg by mouth daily.    Yes [provider]  carvedilol (COREG) 6.25 MG  tablet Take 1 tablet (6.25 mg total) by mouth 2 (two) times daily. 09/05/20  Yes [place], Peter M, MD  docusate sodium (COLACE) 100 MG capsule Take 100 mg by mouth 2 (two) times daily.   Yes [provider]  escitalopram (LEXAPRO) 10 MG tablet Take 10 mg by mouth daily.   Yes [provider]  gabapentin (NEURONTIN) 300 MG capsule Take 300 mg by mouth 2 (two) times daily as needed (pain).   Yes [provider]  levothyroxine (SYNTHROID) 100 MCG tablet Take 100 mcg by mouth daily.   Yes [provider]  loperamide (IMODIUM) 2 MG capsule Take 2 mg by mouth as needed for diarrhea or loose stools.   Yes [provider]  losartan (COZAAR) 25 MG tablet Take 25 mg by mouth daily.   Yes [provider]  ondansetron (ZOFRAN ODT) 4 MG disintegrating tablet Take 1 tablet (4 mg total) by mouth every 8 (eight) hours as needed for nausea or vomiting. 07/06/17  Yes Sherwood Gambler, MD  pantoprazole (PROTONIX) 40 MG tablet Take 40 mg by mouth daily.   Yes [provider]  polyethylene glycol (MIRALAX / GLYCOLAX) 17 g packet Take 17 g by mouth every other day. As needed for constipation   Yes [provider]   Allergies  Allergen Reactions  . Vancomycin Hives    Zosyn and vanc given together; unclear which was precipitating med. Appears to have tolerated cephalosporins in the past.  . Zosyn [Piperacillin Sod-Tazobactam So] Hives    Zosyn and vanc given together; unclear which was precipitating med. Appears to have tolerated cephalosporins in the past.  . Hydrocodone Nausea Only  . Morphine And Related Hives   Review of Systems  Unable to perform ROS: Other  (patient sleeping)  Physical Exam Vitals reviewed.  Constitutional:      General: She is sleeping. She is not in acute distress. Cardiovascular:     Rate and Rhythm: Normal rate and regular rhythm.  Pulmonary:     Effort: Pulmonary effort is normal.  Psychiatric:        Cognition  and Memory: Cognition is impaired.     Vital Signs: BP (!) 169/77   Pulse 68   Temp (!) 97.1 F (36.2 C) (Temporal)   Resp (!) 28   SpO2 99%  Pain Scale: 0-10   Pain Score: 0-No pain   SpO2: SpO2:  99 % O2 Device:SpO2: 99 % O2 Flow Rate: .O2 Flow Rate (L/min): 4 L/min  IO: Intake/output summary:   Intake/Output Summary (Last 24 hours) at 05/01/2021 1009 Last data filed at 05/01/2021 1001 Gross per 24 hour  Intake 3 ml  Output --  Net 3 ml      Palliative Assessment/Data: PPS 30-40%     Time In: 10:09 Time Out: 11:20 Time Total: 71 minutes Greater than 50%  of this time was spent counseling and coordinating care related to the above assessment and plan.  Signed by: Lavena Bullion, NP   Please contact Palliative Medicine Team phone at [phone] for questions and concerns.  For individual provider: See Shea Evans

## 2021-05-01 NOTE — Consult Note (Addendum)
Cardiology Consultation:   Patient ID: Cindy [name]; [id number]; 08-11-1930   Admit date: 05/01/2021 Date of Consult: 05/01/2021  Primary Care Provider: Clovia Cuff, MD Primary Cardiologist: Dr. Peter Martinique, MD  Patient Profile:   Cindy [name] is a 87 y.o. female with a hx of hypertension, hyperlipidemia, hypothyroidism, advanced dementia, DVT/PE 07/2020 >>no longer on anticoagulation and chronic systolic CHF/cardiomyopathy with an LVEF at 35 to 40% who is being seen today for the evaluation of elevated troponin at the request of Kennith Maes, PA.  History of Present Illness:   Ms. Jurczyk is an 87 year old female with a history stated above who presented to [facility] on 05/01/2021 from her assisted living facility, [facility], with symptoms consistent with prior panic attacks including worsening dyspnea and cough with associated shortness of breath. HPI obtained from daughter who was at bedside given patient's advanced dementia. Given her worsening symptoms, EMS was called. Per family's report she has a long history of panic attacks which seem to be consistent with the acute change in her symptoms. On EMS arrival, SPO2 was 88% with increased work of breathing and chest discomfort. She was placed on nonrebreather with improvement.  On ED arrival, HST found to be elevated at 892>>1584 with a stable EKG when compared to prior tracing from 04/29/2021. BNP noted to be elevated at 457 with CXR showing peripheral hazy airspace opacities.  Creatinine stable at 1.17 which appears to be at her baseline.  Of note, she was recently seen in the ED 04/29/2021 for chest pain and shortness of breath at which time CTA was performed given prior history of pulmonary embolus which showed no PE and no other acute finding. Symptoms were similar to her prior panic attacks in the past and the patient felt she was not getting her Xanax as prescribed at her living facility. She was ultimately  discharged from the ED without further work-up.  High-sensitivity troponin levels were normal at that time.  However, during her hospitalization for acute PE/DVT high-sensitivity troponin levels markedly elevated at 1569, 2315 and a 4291.  Overnight follow has requested repeat CTA despite recent evaluation given troponin elevation, history and symptom presentation.  She was last evaluated by Dr. Martinique 08/2020 for follow-up of a hospitalization 07/2020 where she was diagnosed with acute bilateral PE's and DVT. She was started on eliquis. Echo that admission showed EF 30-35%, G2DD, no RWMA, moderately elevated PA pressures with normal RV function/size, and no significant valvular abnormalities. Her cardiomyopathy etiology remained unclear, but possibly 2/2 ischemia vs stress mediated. Her amlodipine was discontinued at that visit and she was started on carvedilol and losartan for conservative management as she was felt to be a poor candidate for invasive evaluation. She was recommended to repeat an echocardiogram in 3 months after follow-up in 4-6 weeks, though neither of these occurred.   She was then seen in follow-up 12/03/2020 in follow-up at which time it was noted that no one had been following her Eliquis since hospital discharge 07/2020.  Plan was to discuss with Dr. Martinique for timing of discontinuation favoring a 25-month course given bilateral PEs and evidence of DVT.  She was continued on guideline directed medical therapy for cardiomyopathy with carvedilol and losartan.  Repeat echocardiogram 12/2020 with mild improvement in LV function to 35 to 40% with global hypokinesis and G1 DD.  Otherwise she was doing well with no acute concerns.  Past Medical History:  Diagnosis Date  . Arthritis   . Cancer ([place])  skin  . Colon cancer ([place]) 08/31/13   invasive squamous cell  . Deaf, left   . Depression   . GERD (gastroesophageal reflux disease)   . History of radiation therapy 10/10/13-11/23/13   anal  canal 54GY/  . Hyperlipemia   . Hypertension   . Hypothyroidism   . PONV (postoperative nausea and vomiting)   . Skin cancer    Shingles 2012 Bad case    Past Surgical History:  Procedure Laterality Date  . ABDOMINAL HYSTERECTOMY    . APPENDECTOMY    . CESAREAN SECTION     2  . CHOLECYSTECTOMY    . COLONOSCOPY  08/31/13   invasive squamous cell colon  . ERCP    . ESOPHAGOGASTRODUODENOSCOPY  10/20/2012   Procedure: ESOPHAGOGASTRODUODENOSCOPY (EGD);  Surgeon: Arta Silence, MD;  Location: [facility];  Service: Endoscopy;  Laterality: Left;  . EYE SURGERY     cataracts  . KNEE ARTHROSCOPY  05/11/2012   Procedure: ARTHROSCOPY KNEE;  Surgeon: Hessie Dibble, MD;  Location: [facility];  Service: Orthopedics;  Laterality: Right;  left knee medial menisectomy and chondroplasty  . THYROIDECTOMY, PARTIAL    . TONSILLECTOMY       Prior to Admission medications   Medication Sig Start Date End Date Taking? Authorizing Provider  acetaminophen (TYLENOL) 325 MG tablet Take 650 mg by mouth every 6 (six) hours as needed for moderate pain.    [provider]  ALPRAZolam Duanne Moron) 0.25 MG tablet Take 1 tablet (0.25 mg total) by mouth at bedtime as needed for anxiety or sleep. 07/30/20   Dwyane Dee, MD  apixaban (ELIQUIS) 5 MG TABS tablet Take 2 tablets (10 mg total) by mouth 2 (two) times daily for 5 days. 07/30/20 08/04/20  Dwyane Dee, MD  apixaban (ELIQUIS) 5 MG TABS tablet Take 1 tablet (5 mg total) by mouth 2 (two) times daily. Starting 8/15 after completes 10 mg BID ending 8/14 Patient taking differently: Take 5 mg by mouth 2 (two) times daily. Pt takes 1 tablet twice daily. 08/05/20   Dwyane Dee, MD  buPROPion (WELLBUTRIN SR) 200 MG 12 hr tablet Take 200 mg by mouth daily.     [provider]  carvedilol (COREG) 6.25 MG tablet Take 1 tablet (6.25 mg total) by mouth 2 (two) times daily. 09/05/20   Martinique, Peter M, MD  docusate sodium (COLACE) 100 MG  capsule Take 100 mg by mouth 2 (two) times daily.    [provider]  escitalopram (LEXAPRO) 10 MG tablet Take 10 mg by mouth daily.    [provider]  ibuprofen (ADVIL) 200 MG tablet Take 200 mg by mouth every 6 (six) hours as needed.    [provider]  levothyroxine (SYNTHROID, LEVOTHROID) 88 MCG tablet Take 88 mcg by mouth daily before breakfast.    [provider]  methocarbamol (ROBAXIN) 500 MG tablet Take 500 mg by mouth 3 (three) times daily.    [provider]  ondansetron (ZOFRAN ODT) 4 MG disintegrating tablet Take 1 tablet (4 mg total) by mouth every 8 (eight) hours as needed for nausea or vomiting. 07/06/17   Sherwood Gambler, MD  pantoprazole (PROTONIX) 40 MG tablet Take 40 mg by mouth daily.    [provider]  polyethylene glycol (MIRALAX / GLYCOLAX) 17 g packet Take 17 g by mouth daily.    [provider]    Inpatient Medications: Scheduled Meds:  Continuous Infusions: . heparin 750 Units/hr (05/01/21 0548)   PRN  Meds:   Allergies:    Allergies  Allergen Reactions  . Vancomycin Hives    Zosyn and vanc given together; unclear which was precipitating med. Appears to have tolerated cephalosporins in the past.  . Zosyn [Piperacillin Sod-Tazobactam So] Hives    Zosyn and vanc given together; unclear which was precipitating med. Appears to have tolerated cephalosporins in the past.  . Hydrocodone Nausea Only  . Morphine And Related Hives    Social History:   Social History   Socioeconomic History  . Marital status: Widowed    Spouse name: Not on file  . Number of children: Not on file  . Years of education: Not on file  . Highest education level: Not on file  Occupational History  . Not on file  Tobacco Use  . Smoking status: Never Smoker  . Smokeless tobacco: Never Used  Substance and Sexual Activity  . Alcohol use: No  . Drug use: No  . Sexual activity: Never  Other Topics Concern  . Not on  file  Social History Narrative  . Not on file   Social Determinants of Health   Financial Resource Strain: Not on file  Food Insecurity: Not on file  Transportation Needs: Not on file  Physical Activity: Not on file  Stress: Not on file  Social Connections: Not on file  Intimate Partner Violence: Not on file    Family History:   Family History  Problem Relation Age of Onset  . Kidney disease Mother   . Heart attack Father   . Heart disease Father   . Cancer Sister        breast  . Cancer Sister        ovarian  . Cancer Sister        melanoma  . Cancer Daughter        breast   Family Status:  Family Status  Relation Name Status  . Mother  Deceased at age 31  . Father  Deceased at age 31  . Sister  Deceased  . Sister  Deceased  . Sister  Deceased  . Daughter  (Not Specified)  . MGM  Deceased  . MGF  Deceased  . PGM  Deceased  . PGF  Deceased    ROS:  Please see the history of present illness.  All other ROS reviewed and negative.     Physical Exam/Data:   Vitals:   05/01/21 0430 05/01/21 0500 05/01/21 0530 05/01/21 0545  BP: 131/62 133/62 (!) 143/69   Pulse: (!) 55 (!) 57 75 64  Resp: 18 (!) 30 (!) 26 (!) 26  Temp:      TempSrc:      SpO2: 100% 100% 91% 100%   No intake or output data in the 24 hours ending 05/01/21 0634 There were no vitals filed for this visit. There is no height or weight on file to calculate BMI.   General: Elderly, NAD Neck: Negative for carotid bruits. No JVD Lungs: Diminished in bilateral bases. Breathing is unlabored. Cardiovascular: RRR with S1 S2. No murmurs Abdomen: Soft, non-tender, non-distended. No obvious abdominal masses. Extremities: No edema. Radial pulses 2+ bilaterally Neuro: Alert and oriented to self, situation. No focal deficits. No facial asymmetry. MAE spontaneously. Psych: Responds to questions somewhat appropriately with normal affect.    EKG:  The EKG was personally reviewed and demonstrates: 05/01/2021  NSR with HR 64 bpm with nonspecific T wave abnormalities, evidence of early repolarization in lateral leads along with anterior Q  waves however no change from prior EKG 04/29/2021 Telemetry:  Telemetry was personally reviewed and demonstrates: 05/01/2021 NSR with HR 60s to 70s  Relevant CV Studies:  Echocardiogram 12/26/2020:  1. Left ventricular ejection fraction, by estimation, is 35 to 40%. Left  ventricular ejection fraction by PLAX is 47 %. The left ventricle has  moderately decreased function. The left ventricle demonstrates global  hypokinesis. Left ventricular diastolic  parameters are consistent with Grade I diastolic dysfunction (impaired  relaxation).  2. Right ventricular systolic function is normal. The right ventricular  size is normal. There is severely elevated pulmonary artery systolic  pressure. The estimated right ventricular systolic pressure is 0000000 mmHg.  3. The mitral valve is normal in structure. Moderate mitral valve  regurgitation. No evidence of mitral stenosis.  4. The aortic valve is tricuspid. Aortic valve regurgitation is not  visualized. Mild to moderate aortic valve sclerosis/calcification is  present, without any evidence of aortic stenosis.  5. The inferior vena cava is normal in size with greater than 50%  respiratory variability, suggesting right atrial pressure of 3 mmHg.    Echocardiogram 07/2020: 1. Left ventricular ejection fraction, by estimation, is 30-35%. The left  ventricle has mildly decreased function. The left ventricle has no  regional wall motion abnormalities. Left ventricular diastolic parameters  are consistent with Grade II diastolic  dysfunction (pseudonormalization). Elevated left ventricular  end-diastolic pressure. There is possible akinesis of the left  ventricular, apical septal wall, apical segment, anterior wall and  inferolateral wall. There is akinesis of the left ventricular,  mid anteroseptal wall.  2. Right  ventricular systolic function is normal. The right ventricular  size is normal. There is moderately elevated pulmonary artery systolic  pressure. The estimated right ventricular systolic pressure is [id number] mmHg.  3. The mitral valve is normal in structure. Mild mitral valve  regurgitation. No evidence of mitral stenosis.  4. The aortic valve is normal in structure. Aortic valve regurgitation is  not visualized. Mild to moderate aortic valve sclerosis/calcification is  present, without any evidence of aortic stenosis.  5. The inferior vena cava is normal in size with greater than 50%  respiratory variability, suggesting right atrial pressure of 3 mmHg.  6. Definity contrast study has been ordered to confirm wall motion  abnormalities.   Laboratory Data:  Chemistry Recent Labs  Lab 04/29/21 0104 05/01/21 0330 05/01/21 0355  NA 140 132* 138  K 4.0 3.7 3.7  CL 110 104  --   CO2 19* 21*  --   GLUCOSE 117* 145*  --   BUN 18 16  --   CREATININE 1.26* 1.17*  --   CALCIUM 9.4 8.8*  --   GFRNONAA 41* 45*  --   ANIONGAP 11 7  --     Total Protein  Date Value Ref Range Status  05/01/2021 6.9 6.5 - 8.1 g/dL Final  12/02/2013 7.2 6.4 - 8.3 g/dL Final   Albumin  Date Value Ref Range Status  05/01/2021 3.4 (L) 3.5 - 5.0 g/dL Final  12/02/2013 3.1 (L) 3.5 - 5.0 g/dL Final   AST  Date Value Ref Range Status  05/01/2021 24 15 - 41 U/L Final  12/02/2013 42 (H) 5 - 34 U/L Final   ALT  Date Value Ref Range Status  05/01/2021 11 0 - 44 U/L Final  12/02/2013 31 0 - 55 U/L Final   Alkaline Phosphatase  Date Value Ref Range Status  05/01/2021 50 38 - 126 U/L Final  12/02/2013 65  40 - 150 U/L Final   Total Bilirubin  Date Value Ref Range Status  05/01/2021 0.3 0.3 - 1.2 mg/dL Final  12/02/2013 0.46 0.20 - 1.20 mg/dL Final   Hematology Recent Labs  Lab 04/29/21 0104 05/01/21 0330 05/01/21 0355  WBC 8.0 7.3  --   RBC 2.71* 2.76*  --   HGB 8.3* 8.4* 9.2*  HCT 27.5* 27.4*  27.0*  MCV 101.5* 99.3  --   MCH 30.6 30.4  --   MCHC 30.2 30.7  --   RDW 13.7 14.0  --   PLT 272 284  --    Cardiac EnzymesNo results for input(s): TROPONINI in the last 168 hours. No results for input(s): TROPIPOC in the last 168 hours.  BNP Recent Labs  Lab 05/01/21 0330  BNP 457.9*    DDimer No results for input(s): DDIMER in the last 168 hours. TSH:  Lab Results  Component Value Date   TSH 1.071 10/20/2012   Lipids:No results found for: CHOL, HDL, LDLCALC, LDLDIRECT, TRIG, CHOLHDL HgbA1c:No results found for: HGBA1C  Radiology/Studies:  DG Chest 2 View  Result Date: 04/29/2021 CLINICAL DATA:  Chest pain. EXAM: CHEST - 2 VIEW COMPARISON:  Chest x-ray 07/28/2020, CT chest 07/28/2020 FINDINGS: The heart size and mediastinal contours are unchanged. Aortic arch calcifications. Biapical pleural/pulmonary scarring. No focal consolidation. Slightly increased interstitial markings persistent with no overt pulmonary edema. Likely trace bilateral pleural effusions. No pneumothorax. No acute osseous abnormality. Right upper quadrant surgical clips. IMPRESSION: 1. Likely trace bilateral pleural effusions. 2. Otherwise no acute cardiopulmonary abnormality. Electronically Signed   By: Iven Finn M.D.   On: 04/29/2021 00:28   DG Chest Portable 1 View  Result Date: 05/01/2021 CLINICAL DATA:  Dyspnea. EXAM: PORTABLE CHEST 1 VIEW COMPARISON:  Chest x-ray 04/29/2021, CT chest 04/29/2021 FINDINGS: The heart size and mediastinal contours are unchanged. Referral hazy airspace opacities. No pulmonary edema. No pleural effusion. No pneumothorax. No acute osseous abnormality. IMPRESSION: Peripheral hazy airspace opacities. COVID-19 infection not excluded. Electronically Signed   By: Iven Finn M.D.   On: 05/01/2021 03:50   CT Angio Chest/Abd/Pel for Dissection W and/or Wo Contrast  Result Date: 04/29/2021 CLINICAL DATA:  87 year old female with chest pain, chest heaviness, shortness of breath  since last night. Prior history of rectal cancer. EXAM: CT ANGIOGRAPHY CHEST, ABDOMEN AND PELVIS TECHNIQUE: Initial noncontrast CT of the chest provided. Multidetector CT imaging through the chest, abdomen and pelvis was performed using the standard protocol during bolus administration of intravenous contrast. Multiplanar reconstructed images and MIPs were obtained and reviewed to evaluate the vascular anatomy. CONTRAST:  175mL OMNIPAQUE IOHEXOL 350 MG/ML SOLN COMPARISON:  CTA chest 07/28/2020. CT Abdomen and Pelvis 07/06/2017. Right hip CT 02/05/2021. FINDINGS: CTA CHEST FINDINGS Cardiovascular: Calcified coronary artery atherosclerosis. Thoracic aortic atherosclerosis, but no thoracic aortic aneurysm or dissection. Tortuous proximal great vessels appear patent. Central pulmonary arteries also well opacified. No pulmonary artery filling defect identified. Borderline to mild cardiomegaly.  No pericardial effusion. Mediastinum/Nodes: No mediastinal mass or lymphadenopathy. Lungs/Pleura: Resolved pleural effusions since August. Improved lung volumes and ventilation. Major airways are patent. Mild left upper lobe mosaic attenuation near the lingula felt related to gas trapping. Musculoskeletal: No acute osseous abnormality identified. Review of the MIP images confirms the above findings. CTA ABDOMEN AND PELVIS FINDINGS VASCULAR Negative for abdominal aortic aneurysm or dissection. Mild to moderate for age Aortoiliac calcified atherosclerosis. Major arterial branches in the abdomen and pelvis remain patent. Review of the MIP images confirms the  above findings. NON-VASCULAR Hepatobiliary: Chronically absent gallbladder.  Negative liver. Pancreas: Negative. Spleen: Negative. Adrenals/Urinary Tract: Normal adrenal glands. Nonobstructed kidneys with symmetric renal enhancement appear stable since 2018. No nephrolithiasis. Negative ureters. Incidental pelvic phleboliths. Decompressed bladder. Chronic space of Retzius  nodularity is stable since 2018 and might be posttraumatic or post treatment related. Stomach/Bowel: No dilated large or small bowel. Large bowel diverticulosis most concentrated in the sigmoid. No active inflammation. Diminutive or absent appendix. Cecum partially located in the pelvis. Negative terminal ileum. Negative stomach and duodenum. No free air, free fluid. Lymphatic: No lymphadenopathy. Reproductive: Surgically absent as before. Other: No pelvic free fluid. Musculoskeletal: Comminuted, unhealed fracture of the right acetabulum with a degree of acetabular protrusio appears stable since February. Chronic appearing deformity of the right pubic symphysis is stable, new since 2018. No new osseous abnormality identified. Review of the MIP images confirms the above findings. IMPRESSION: 1. Normal aorta aside from Atherosclerosis (ICD10-I70.0). Pulmonary arteries are also well opacified and negative for PE. 2. No acute or inflammatory process identified in the chest, abdomen, or pelvis. 3. Comminuted and unhealed right acetabular fracture stable since February. Electronically Signed   By: Genevie Ann M.D.   On: 04/29/2021 07:08   Assessment and Plan:   1.  Elevated troponin: -Patient presented from [facility] with symptoms consistent with prior panic attacks including acute shortness of breath, dyspnea and cough found to have an markedly elevated troponin at 892 with a repeat at 1584.  EKG with no acute change from prior. She has a prior history of chronic systolic CHF/cardiomyopathy with an LVEF at 30 to 35% (repeat echo 12/2020 with EF to 35-40%) on presumed to be ischemic versus stress-induced from 07/2020 (at the time of acute PE.DVT) although no further invasive work-up was pursued given poor candidacy per Dr. Martinique.  During her hospitalization for acute PE/DVT patient also noted to have markedly elevated high-sensitivity troponin levels >4000.  -She was recently seen in the ED 04/29/2021 with similar  symptoms as above at which time CTA was performed given her history which was negative however HST's were normal.  Overnight fellow recommended repeat CTA to rule out acute PE however this was deferred.  Case discussed with daughter at bedside and overnight follow with no plans for invasive cardiac evaluation given advanced age, advanced dementia and poor functional status. -Plan is to continue IV heparin for 72 hours -She is currently chest pain-free -Continue current regimen with carvedilol -Would start IV Lasix 40mg  QD and follow response as below  -Consider repeat echocardiogram however not likely to change management at this time  2.  Chronic systolic and diastolic CHF/cardiomyopathy -Plan at last outpatient follow-up was to repeat echocardiogram to reevaluate LV function which was performed 12/26/2020 with an LVEF at 35 to 40% with global hypokinesis and G1 DD -She has been maintained with carvedilol and losartan -Would continue with GDMT as above -Given elevated BNP and CXR consistent with fluid volume overload we will plan for IV Lasix 40 mg daily and follow response closely -Weight, none noted  -I&O, none noted  -Daily weights, strict I&O  3. HTN:  -Stable, 143/69>>133/62 -Continue current regimen with carvedilol and losartan   4.  History of PE/DVT:  -Initially diagnosed 07/2020 treated with p.o. Eliquis however this has since been discontinued.  She was recently seen in the ED 04/29/2021 with similar symptoms at which time CTA was performed which is negative for PE however high-sensitivity troponin levels were normal. -Given markedly elevated troponin  levels, overnight fellow recommended repeat CTA despite recent study to ensure no PE however this was deferred.   -Placed on IV heparin out of concern for ACS >> continue for 48 hours with no plans for invasive cardiac work-up  -Continue medical management  -Would recommend CTA for thorough evaluation   5.  Dementia: -Has advancing  dementia   For questions or updates, please contact [facility] Please consult [email or web address] for contact info under Cardiology/STEMI.   SignedKathyrn Drown NP-C [facility] Pager: [phone] 05/01/2021 6:34 AM Personally seen and examined. Agree with APP above with the following comments: Briefly 87 yo F with profound anxiety advanced dementia, aortic atherosclerosis, prior PE previously on [facility] Novamed, presenting with NSTEMI. Patient is somnolent yet rousable; in discussion with APP more alert during their conversation. Exam notable for course breath sounds in the bases Labs notable for troponin of 1500 (not yet to peak) Personally reviewed relevant tests; sinus bradycardia presently Would recommend  - agree with [facility] consultation; discussed case at length with middle daughter (there are four siblings) and family is having [place] discussions - continue heparin for 48 hours - starting ASA and loading plavix (ordered) - coreg and losartan are reasonable - agree with low dose IV lasix   Discussed at length with daughter.  Rudean Haskell, MD [id number] [place], [place] [address] [phone]  12:08 PM

## 2021-05-01 NOTE — ED Notes (Signed)
Daughter at bedside.

## 2021-05-01 NOTE — Progress Notes (Signed)
Heart Failure Navigator Progress Note  Assessed for Heart & Vascular TOC clinic readiness.  Unfortunately at this time the patient does not meet criteria due to advanced dementia.   Navigator available for reassessment of patient.   Mizani Dilday, RN, BSN Heart Failure Nurse Navigator [phone]  

## 2021-05-01 NOTE — ED Triage Notes (Signed)
Pt EMS arrival from [facility] co panic attack and SOB witnessed by family and staff with audible wheezing. Per EMS increased left sided crackles upon assessment upon supine positioning, previous L hip fracture BP 180/110, 10L nonrebreather initial SPO2 88% RA

## 2021-05-01 NOTE — ED Notes (Signed)
Petrucelli PA made aware of critical lab

## 2021-05-01 NOTE — ED Notes (Signed)
Pt provided water and lunch bag, daughter at bedside to assist

## 2021-05-01 NOTE — Progress Notes (Signed)
[facility] for Heparin  Indication: chest pain/ACS, elevated troponin  Allergies  Allergen Reactions  . Vancomycin Hives    Zosyn and vanc given together; unclear which was precipitating med. Appears to have tolerated cephalosporins in the past.  . Zosyn [Piperacillin Sod-Tazobactam So] Hives    Zosyn and vanc given together; unclear which was precipitating med. Appears to have tolerated cephalosporins in the past.  . Hydrocodone Nausea Only  . Morphine And Related Hives     Vital Signs: Temp: 98.1 F (36.7 C) (05/11 1735) Temp Source: Oral (05/11 1735) BP: 151/135 (05/11 2000) Pulse Rate: 59 (05/11 2000)  Labs: Recent Labs    04/29/21 0104 04/29/21 0203 05/01/21 0330 05/01/21 0355 05/01/21 0546 05/01/21 2013  HGB 8.3*  --  8.4* 9.2*  --   --   HCT 27.5*  --  27.4* 27.0*  --   --   PLT 272  --  284  --   --   --   LABPROT 14.5  --   --   --   --   --   INR 1.1  --   --   --   --   --   HEPARINUNFRC  --   --   --   --   --  0.47  CREATININE 1.26*  --  1.17*  --   --   --   TROPONINIHS 18* 19* 892*  --  1,584*  --     Estimated Creatinine Clearance: 30.5 mL/min (A) (by C-G formula based on SCr of 1.17 mg/dL (H)).   Medical History: Past Medical History:  Diagnosis Date  . Arthritis   . Cancer ([place])    skin  . Colon cancer ([place]) 08/31/13   invasive squamous cell  . Deaf, left   . Depression   . GERD (gastroesophageal reflux disease)   . History of radiation therapy 10/10/13-11/23/13   anal canal 54GY/  . Hyperlipemia   . Hypertension   . Hypothyroidism   . PONV (postoperative nausea and vomiting)   . Skin cancer    Shingles 2012 Bad case    Assessment: 87 y/o F to start heparin per pharmacy for elevated troponin. She does have a history of VTE but has been of Apixaban for a few months now. Hgb 9.2. Renal function ok.   Initial heparin level therapeutic at 0.47.  Goal of Therapy:  Heparin level 0.3-0.7  units/ml Monitor platelets by anticoagulation protocol: Yes   Plan:  Continue heparin 750 units/h Daily heparin level and CBC   Arrie Senate, PharmD, [facility] Clinical Pharmacist [phone] Please check AMION for all [facility] numbers 05/01/2021

## 2021-05-01 NOTE — H&P (Signed)
History and Physical    Cindy [name] WJX:[id number] DOB: [birth year] DOA: 05/01/2021  PCP: Clovia Cuff, MD Consultants:  [place]; [place] - cardiology Patient coming from: [facility]; NOK: Daughter, Jerl Mina, [phone]  Chief Complaint: Panic attack, SOB  HPI: Cindy [name] is a 87 y.o. female with medical history significant of hypothyroidism; HTN; HLD; remote colon cancer; DVT/PE not on AC; chronic systolic CHF; and dementia with behavioral disturbance presenting with panic attack, SOB. The patient reports that she had severe chest pain with SOB, prompting ER visit.  She was very somnolent at the time of admission and provided little additional history.  Her daughter was not present and was not available by telephone at the time of admission.  MOST form reviewed and patient is DNR with goal for limited interventions.    ED Course:  Carryover, per Dr. Nevada Crane:  Dyspnea that worsened overnight. Intermittent shortness of breath & chest pain for the past few days, dyspnea worse when lying. Work-up in the ED revealed NSTEMI. Cardiology consulted by EDP and recommended conservative management and to start heparin drip.   Review of Systems: Limited by somnolence, dementia   COVID Vaccine Status:  Complete  Past Medical History:  Diagnosis Date  . Arthritis   . Cancer ([place])    skin  . Colon cancer ([place]) 08/31/13   invasive squamous cell  . Deaf, left   . Depression   . GERD (gastroesophageal reflux disease)   . History of radiation therapy 10/10/13-11/23/13   anal canal 54GY/  . Hyperlipemia   . Hypertension   . Hypothyroidism   . PONV (postoperative nausea and vomiting)   . Skin cancer    Shingles 2012 Bad case    Past Surgical History:  Procedure Laterality Date  . ABDOMINAL HYSTERECTOMY    . APPENDECTOMY    . CESAREAN SECTION     2  . CHOLECYSTECTOMY    . COLONOSCOPY  08/31/13   invasive squamous cell colon  . ERCP    .  ESOPHAGOGASTRODUODENOSCOPY  10/20/2012   Procedure: ESOPHAGOGASTRODUODENOSCOPY (EGD);  Surgeon: Arta Silence, MD;  Location: [facility];  Service: Endoscopy;  Laterality: Left;  . EYE SURGERY     cataracts  . KNEE ARTHROSCOPY  05/11/2012   Procedure: ARTHROSCOPY KNEE;  Surgeon: Hessie Dibble, MD;  Location: [facility];  Service: Orthopedics;  Laterality: Right;  left knee medial menisectomy and chondroplasty  . THYROIDECTOMY, PARTIAL    . TONSILLECTOMY      Social History   Socioeconomic History  . Marital status: Widowed    Spouse name: Not on file  . Number of children: Not on file  . Years of education: Not on file  . Highest education level: Not on file  Occupational History  . Not on file  Tobacco Use  . Smoking status: Never Smoker  . Smokeless tobacco: Never Used  Substance and Sexual Activity  . Alcohol use: No  . Drug use: No  . Sexual activity: Never  Other Topics Concern  . Not on file  Social History Narrative  . Not on file   Social Determinants of Health   Financial Resource Strain: Not on file  Food Insecurity: Not on file  Transportation Needs: Not on file  Physical Activity: Not on file  Stress: Not on file  Social Connections: Not on file  Intimate Partner Violence: Not on file    Allergies  Allergen Reactions  . Vancomycin Hives    Zosyn  and vanc given together; unclear which was precipitating med. Appears to have tolerated cephalosporins in the past.  . Zosyn [Piperacillin Sod-Tazobactam So] Hives    Zosyn and vanc given together; unclear which was precipitating med. Appears to have tolerated cephalosporins in the past.  . Hydrocodone Nausea Only  . Morphine And Related Hives    Family History  Problem Relation Age of Onset  . Kidney disease Mother   . Heart attack Father   . Heart disease Father   . Cancer Sister        breast  . Cancer Sister        ovarian  . Cancer Sister        melanoma  . Cancer Daughter         breast    Prior to Admission medications   Medication Sig Start Date End Date Taking? Authorizing Provider  acetaminophen (TYLENOL) 325 MG tablet Take 650 mg by mouth every 6 (six) hours as needed for moderate pain.    [provider]  ALPRAZolam Duanne Moron) 0.25 MG tablet Take 1 tablet (0.25 mg total) by mouth at bedtime as needed for anxiety or sleep. 07/30/20   Dwyane Dee, MD  apixaban (ELIQUIS) 5 MG TABS tablet Take 2 tablets (10 mg total) by mouth 2 (two) times daily for 5 days. 07/30/20 08/04/20  Dwyane Dee, MD  apixaban (ELIQUIS) 5 MG TABS tablet Take 1 tablet (5 mg total) by mouth 2 (two) times daily. Starting 8/15 after completes 10 mg BID ending 8/14 Patient taking differently: Take 5 mg by mouth 2 (two) times daily. Pt takes 1 tablet twice daily. 08/05/20   Dwyane Dee, MD  buPROPion (WELLBUTRIN SR) 200 MG 12 hr tablet Take 200 mg by mouth daily.     [provider]  carvedilol (COREG) 6.25 MG tablet Take 1 tablet (6.25 mg total) by mouth 2 (two) times daily. 09/05/20   [place], Peter M, MD  docusate sodium (COLACE) 100 MG capsule Take 100 mg by mouth 2 (two) times daily.    [provider]  escitalopram (LEXAPRO) 10 MG tablet Take 10 mg by mouth daily.    [provider]  ibuprofen (ADVIL) 200 MG tablet Take 200 mg by mouth every 6 (six) hours as needed.    [provider]  levothyroxine (SYNTHROID, LEVOTHROID) 88 MCG tablet Take 88 mcg by mouth daily before breakfast.    [provider]  methocarbamol (ROBAXIN) 500 MG tablet Take 500 mg by mouth 3 (three) times daily.    [provider]  ondansetron (ZOFRAN ODT) 4 MG disintegrating tablet Take 1 tablet (4 mg total) by mouth every 8 (eight) hours as needed for nausea or vomiting. 07/06/17   Sherwood Gambler, MD  pantoprazole (PROTONIX) 40 MG tablet Take 40 mg by mouth daily.    [provider]  polyethylene glycol (MIRALAX / GLYCOLAX) 17 g packet Take 17 g by  mouth daily.    [provider]    Physical Exam: Vitals:   05/01/21 0500 05/01/21 0530 05/01/21 0545 05/01/21 0915  BP: 133/62 (!) 143/69  (!) 169/77  Pulse: (!) 57 75 64 68  Resp: (!) 30 (!) 26 (!) 26 (!) 28  Temp:      TempSrc:      SpO2: 100% 91% 100% 99%     . General:  Appears calm and comfortable and is in NAD . Eyes:  EOMI, normal lids, iris . ENT:  grossly normal lips & tongue, mmm .  Neck:  no LAD, masses or thyromegaly . Cardiovascular:  RRR, no m/r/g. 1+ LE edema.  Marland Kitchen Respiratory:   CTA bilaterally with no wheezes/rales/rhonchi.  Normal respiratory effort. . Abdomen:  soft, NT, ND . Skin:  no rash or induration seen on limited exam . Musculoskeletal:   no bony abnormality . Psychiatric:  somnolent mood and affect, speech limited, AOx2 (knew she was in the hospital) . Neurologic:  Unable to perform    Radiological Exams on Admission: Independently reviewed - see discussion in A/P where applicable  DG Chest Portable 1 View  Result Date: 05/01/2021 CLINICAL DATA:  Dyspnea. EXAM: PORTABLE CHEST 1 VIEW COMPARISON:  Chest x-ray 04/29/2021, CT chest 04/29/2021 FINDINGS: The heart size and mediastinal contours are unchanged. Referral hazy airspace opacities. No pulmonary edema. No pleural effusion. No pneumothorax. No acute osseous abnormality. IMPRESSION: Peripheral hazy airspace opacities. COVID-19 infection not excluded. Electronically Signed   By: Iven Finn M.D.   On: 05/01/2021 03:50    EKG: Independently reviewed.  NSR with rate 64; LVH; nonspecific ST changes that do not appear to be significantly different from prior   Labs on Admission: I have personally reviewed the available labs and imaging studies at the time of the admission.  Pertinent labs:   VBG: 7.403/36/147 Na++ 132 Glucose 145 BUN 16/Creatinine 1.17/GFR 45 BNP 457.9 HS troponin 892, 1584 WBC 7.3 Hgb 8.4 COVID/flu negative   Assessment/Plan Principal Problem:   NSTEMI  (non-ST elevated myocardial infarction) (HCC) Active Problems:   Hypothyroidism   Hypertension   Hyperlipidemia   DNR (do not resuscitate)   Dementia with behavioral disturbance ([place])   Chronic combined systolic and diastolic heart failure ([place])   NSTEMI -Patient with substernal chest pain that came on acutely with associated SOB -CXR with hazy airspace opacities; however, CP has resolved and patient is no longer SOB.  -Initial HS troponin significantly elevated with very positive delta.   -EKG with ST changes that appear to be unchanged from prior. -Will admit since the patient has positive troponins and/or an abnormal EKG with angina necessitating acute intervention. -Cardiology consultation requested -Start ASA 81 mg daily -NTG for symptom relief (although there is no mortality benefit) -Will plan to start Heparin drip since enzymes are positive; per cardiology, will not plan to cath but will continue heparin x 48-72 hours  HTN -Continue Coreg, hold Cozaar (due to renal dysfunction that appears to be stable/chronic) -Will also add prn hydralazine  HLD -She does not appear to be taking statin medication -Limited utility in testing/treating lipids in her demographic -Will defer to cardiology  Chronic combined CHF -Prior echo in 12/2020 with EF 35-40% with grade 1 diastolic dysfunction -Not frankly overloaded now but suspect CXR changes are associated with pulmonary edema rather than infection -Recheck Echo -Continue IV Lasix for now  Hypothyroidism -Continue Synthroid at current dose for now  Dementia -Patient with h/o dementia, has panic attacks -Continue Xanax, Wellbutrin, Lexapro -Palliative care was previously involved and patient has a MOST form -She will remain DNR -Will consult palliative care again to determine how aggressive family wants to be in such a situation      DVT prophylaxis: Heparin drip Code Status:  DNR Family Communication: None present and  unable to reach daughter by telephone at the time of admission; will defer to palliative care Disposition Plan:  The patient is from: SNF  Anticipated d/c is to: SNF  Anticipated d/c date will depend on clinical response to treatment, likely 2-4 days  Patient is currently: acutely ill Consults called: Cardiology; Palliative Care; TOC team; nutrition; PT/OT  Admission status: Admit - It is my clinical opinion that admission to INPATIENT is reasonable and necessary because of the expectation that this patient will require hospital care that crosses at least 2 midnights to treat this condition based on the medical complexity of the problems presented.  Given the aforementioned information, the predictability of an adverse outcome is felt to be significant.     Karmen Bongo MD [facility]   How to contact the [facility] Attending or Consulting provider [place] or covering provider during after hours [place], for this patient?  1. Check the care team in [facility] and look for a) attending/consulting [facility] provider listed and b) the [facility] team listed 2. Log into [email or web address] and use [place]'s universal password to access. If you do not have the password, please contact the hospital operator. 3. Locate the [facility] provider you are looking for under [facility] and page to a number that you can be directly reached. 4. If you still have difficulty reaching the provider, please page the [facility] (Director on Call) for the Hospitalists listed on amion for assistance.   05/01/2021, 12:20 PM

## 2021-05-01 NOTE — Progress Notes (Signed)
ANTICOAGULATION CONSULT NOTE - Initial Consult  Pharmacy Consult for Heparin  Indication: chest pain/ACS, elevated troponin  Allergies  Allergen Reactions  . Vancomycin Hives    Zosyn and vanc given together; unclear which was precipitating med. Appears to have tolerated cephalosporins in the past.  . Zosyn [Piperacillin Sod-Tazobactam So] Hives    Zosyn and vanc given together; unclear which was precipitating med. Appears to have tolerated cephalosporins in the past.  . Hydrocodone Nausea Only  . Morphine And Related Hives     Vital Signs: Temp: 97.1 F (36.2 C) (05/11 0322) Temp Source: Temporal (05/11 0322) BP: 143/69 (05/11 0530) Pulse Rate: 64 (05/11 0545)  Labs: Recent Labs    04/29/21 0104 04/29/21 0203 05/01/21 0330 05/01/21 0355  HGB 8.3*  --  8.4* 9.2*  HCT 27.5*  --  27.4* 27.0*  PLT 272  --  284  --   LABPROT 14.5  --   --   --   INR 1.1  --   --   --   CREATININE 1.26*  --  1.17*  --   TROPONINIHS 18* 19* 892*  --     Estimated Creatinine Clearance: 30.5 mL/min (A) (by C-G formula based on SCr of 1.17 mg/dL (H)).   Medical History: Past Medical History:  Diagnosis Date  . Arthritis   . Cancer ([place])    skin  . Colon cancer ([place]) 08/31/13   invasive squamous cell  . Deaf, left   . Depression   . GERD (gastroesophageal reflux disease)   . History of radiation therapy 10/10/13-11/23/13   anal canal 54GY/  . Hyperlipemia   . Hypertension   . Hypothyroidism   . PONV (postoperative nausea and vomiting)   . Skin cancer    Shingles 2012 Bad case    Assessment: 87 y/o F to start heparin per pharmacy for elevated troponin. She does have a history of VTE but has been of Apixaban for a few months now. Hgb 9.2. Renal function ok.   Goal of Therapy:  Heparin level 0.3-0.7 units/ml Monitor platelets by anticoagulation protocol: Yes   Plan:  Heparin 3000 units BOLUS Start heparin drip at 750 units/hr 1400 Heparin level Daily CBC/Heparin  level Monitor for bleeding  Narda Bonds, PharmD, BCPS Clinical Pharmacist Phone: [phone]

## 2021-05-02 LAB — BASIC METABOLIC PANEL
Anion gap: 10 (ref 5–15)
BUN: 19 mg/dL (ref 8–23)
CO2: 23 mmol/L (ref 22–32)
Calcium: 9.3 mg/dL (ref 8.9–10.3)
Chloride: 104 mmol/L (ref 98–111)
Creatinine, Ser: 1.16 mg/dL — ABNORMAL HIGH (ref 0.44–1.00)
GFR, Estimated: 45 mL/min — ABNORMAL LOW (ref >60)
Glucose, Bld: 118 mg/dL — ABNORMAL HIGH (ref 70–99)
Potassium: 4.1 mmol/L (ref 3.5–5.1)
Sodium: 137 mmol/L (ref 135–145)

## 2021-05-02 LAB — HEPARIN LEVEL (UNFRACTIONATED): Heparin Unfractionated: 0.4 IU/mL (ref 0.30–0.70)

## 2021-05-02 LAB — CBC
HCT: 28 % — ABNORMAL LOW (ref 36.0–46.0)
Hemoglobin: 8.6 g/dL — ABNORMAL LOW (ref 12.0–15.0)
MCH: 30.2 pg (ref 26.0–34.0)
MCHC: 30.7 g/dL (ref 30.0–36.0)
MCV: 98.2 fL (ref 80.0–100.0)
Platelets: 288 10*3/uL (ref 150–400)
RBC: 2.85 MIL/uL — ABNORMAL LOW (ref 3.87–5.11)
RDW: 13.8 % (ref 11.5–15.5)
WBC: 7.3 10*3/uL (ref 4.0–10.5)
nRBC: 0 % (ref 0.0–0.2)

## 2021-05-02 MED ORDER — BUSPIRONE HCL 5 MG PO TABS
5.0000 mg | ORAL_TABLET | Freq: Two times a day (BID) | ORAL | Status: DC
  Administered 2021-05-02 – 2021-05-03 (×2): 5 mg via ORAL
  Filled 2021-05-02 (×2): qty 1

## 2021-05-02 MED ORDER — ENSURE ENLIVE PO LIQD: 237.0000 mL | ORAL | Status: DC

## 2021-05-02 MED ORDER — ESCITALOPRAM OXALATE 10 MG PO TABS
20.0000 mg | ORAL_TABLET | Freq: Every day | ORAL | Status: DC
  Administered 2021-05-03: 20 mg via ORAL
  Filled 2021-05-02: qty 2

## 2021-05-02 MED ORDER — ALPRAZOLAM 0.5 MG PO TABS: 0.5000 mg | ORAL_TABLET | Freq: Three times a day (TID) | ORAL | Status: DC | PRN

## 2021-05-02 MED ORDER — LOSARTAN POTASSIUM 25 MG PO TABS
12.5000 mg | ORAL_TABLET | Freq: Every day | ORAL | Status: DC
  Administered 2021-05-02 – 2021-05-03 (×2): 12.5 mg via ORAL
  Filled 2021-05-02 (×2): qty 1

## 2021-05-02 NOTE — Progress Notes (Signed)
[facility] for Heparin  Indication: chest pain/ACS, elevated troponin  Allergies  Allergen Reactions  . Vancomycin Hives    Zosyn and vanc given together; unclear which was precipitating med. Appears to have tolerated cephalosporins in the past.  . Zosyn [Piperacillin Sod-Tazobactam So] Hives    Zosyn and vanc given together; unclear which was precipitating med. Appears to have tolerated cephalosporins in the past.  . Hydrocodone Nausea Only  . Morphine And Related Hives     Vital Signs: Temp: 98.1 F (36.7 C) (05/12 0728) Temp Source: Oral (05/12 0728) BP: 146/75 (05/12 0728) Pulse Rate: 83 (05/12 0728)  Labs: Recent Labs    05/01/21 0330 05/01/21 0355 05/01/21 0546 05/01/21 2013 05/02/21 0300  HGB 8.4* 9.2*  --   --  8.6*  HCT 27.4* 27.0*  --   --  28.0*  PLT 284  --   --   --  288  HEPARINUNFRC  --   --   --  0.47 0.40  CREATININE 1.17*  --   --   --  1.16*  TROPONINIHS 892*  --  1,584*  --   --     Estimated Creatinine Clearance: 30.8 mL/min (A) (by C-G formula based on SCr of 1.16 mg/dL (H)).   Medical History: Past Medical History:  Diagnosis Date  . Arthritis   . Cancer ([place])    skin  . Colon cancer ([place]) 08/31/13   invasive squamous cell  . Deaf, left   . Depression   . GERD (gastroesophageal reflux disease)   . History of radiation therapy 10/10/13-11/23/13   anal canal 54GY/  . Hyperlipemia   . Hypertension   . Hypothyroidism   . PONV (postoperative nausea and vomiting)   . Skin cancer    Shingles 2012 Bad case    Assessment: 87 y/o F to start heparin per pharmacy for elevated troponin. She does have a history of VTE but has been of Apixaban for a few months now. No plans for cardiac cath and heparin plans are for 48-72 hours (started 5/11 ~ 6am) -Hgb 8.6.    -heparin level at goal  Initial heparin level therapeutic at 0.47.  Goal of Therapy:  Heparin level 0.3-0.7 units/ml Monitor platelets by  anticoagulation protocol: Yes   Plan:  Continue heparin 750 units/h Daily heparin level and CBC  Hildred Laser, PharmD Clinical Pharmacist **Pharmacist phone directory can now be found on [email or web address] ([email or web address]).  Listed under [place].

## 2021-05-02 NOTE — Progress Notes (Signed)
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  Patient reported severe chest pain associated with dyspnea.  Patient presenting from [facility].  In the ED, temperature 97.1 F, HR 65, RR 31, BP 160/82, SPO2 100% on 4 L nasal cannula.  Sodium 132, potassium 3.7, chloride 104, CO2 21, glucose 145, BUN 16, creatinine 1.17.  AST 24, ALT 11, total bilirubin 0.3.  WBC 7.3, hemoglobin 8.4, platelets 284.  COVID-19 PCR negative.  Influenza A/B PCR negative.  BNP 457.9.  Troponin [id number]. EKG with NSR, rate 64, QTc 455, T wave inversion in 1/aVL which is unchanged after reviewing EKG from 04/29/2021 and 08/01/2020.  Chest x-ray with peripheral hazy airspace opacities.  Cardiology was consulted, recommended conservative management with heparin drip and Plavix.  Palliative care consulted for goals of care and medical decision making.  Hospital service consulted for further evaluation and management.  Assessment & Plan:   Principal Problem:   NSTEMI (non-ST elevated myocardial infarction) ([place]) Active Problems:   Hypothyroidism   Hypertension   Hyperlipidemia   DNR (do not resuscitate)   Dementia with behavioral disturbance (Cindy [name])   Chronic combined systolic and diastolic heart failure ([place])   NSTEMI Patient presenting from ALF with panic attack associated with shortness of breath and chest pain.  Was found to have an elevated high sensitive troponin of 892, followed by 1584.  EKG with normal sinus rhythm, T wave inversion in lead I/aVL which is  unchanged from previous EKGs.  Chest x-ray with peripheral hazy airspace opacities with elevated BNP of 457.9.  Transthoracic echocardiogram with severe hypokinesis anterior/septal/apical walls with LVEF 30-35% with LV moderate/severe decreased function, grade 2 diastolic dysfunction, moderately dilated LA, mild MR and normal IVC. --Cardiology following, appreciate assistance; plan conservative measures without any further diagnostic or invasive testing at this time --Continue heparin drip x48 hours --Aspirin 81 mg p.o. daily, Plavix 75 mg p.o. daily --Continue to monitor on telemetry  Acute on chronic combined systolic and diastolic congestive heart failure BNP elevated on admission with hazy peripheral opacities on chest x-ray. Transthoracic echocardiogram with severe hypokinesis anterior/septal/apical walls with LVEF 30-35% with LV moderate/severe decreased function, grade 2 diastolic dysfunction, moderately dilated LA, mild MR and normal IVC. -- Furosemide 40 mg IV daily --Carvedilol 6.25 mg p.o. twice daily --Losartan 12.5 mg p.o. daily --Strict I's and O's and daily weights  Panic attacks Anxiety/depression Advanced dementia with behavioral disturbance --Palliative care consulted for assistance with goals of care and medical decision making --Increase Lexapro to 20 mg p.o. daily --Wellbutrin 200 mg p.o. daily --Xanax 0.5 mg TID PRN and scheduled HS  Hypothyroidism -- Levothyroxine 100 mcg p.o. daily  HLD: Not on statin outpatient  GERD: Continue PPI  DVT prophylaxis: Heparin drip   Code Status: DNR Family Communication: Updated patient's daughter was present at bedside this morning  Disposition Plan:  Level of care: Progressive Status is: Inpatient  Remains inpatient appropriate because:Ongoing diagnostic testing needed not appropriate for outpatient work up, Unsafe d/c plan, IV treatments appropriate due to intensity of illness or inability to take PO and Inpatient level of  care appropriate due  to severity of illness   Dispo: The patient is from: ALF              Anticipated d/c is to: ALF              Patient currently is not medically stable to d/c.   Difficult to place patient No   Consultants:   Cardiology  Palliative care  Procedures:   TTE  Antimicrobials:   None   Subjective: Patient seen and examined at bedside, resting comfortably.  Pleasantly confused.  Daughter present.  No complaints this morning.  Continues on heparin drip.  Seen by cardiology this morning with recommendations to continue heparin drip x48 hours.  Palliative care also following for assistance with goals of care and medical decision making.  Daughter concerned about lack of PCP at ALF requesting referral for geriatrician Dr. Lyndel Safe.  Also concerned about her panic attacks.  No other questions or concerns at this time.  No acute events overnight per nursing staff.  Objective: Vitals:   05/02/21 0235 05/02/21 0335 05/02/21 0500 05/02/21 0728  BP: (!) 129/59 (!) 139/57  (!) 146/75  Pulse: 62 65  83  Resp:  16  18  Temp:  98.4 F (36.9 C)  98.1 F (36.7 C)  TempSrc:  Oral  Oral  SpO2: (!) 89% 90%  96%  Weight:   65.1 kg     Intake/Output Summary (Last 24 hours) at 05/02/2021 1058 Last data filed at 05/02/2021 0300 Gross per 24 hour  Intake 426.48 ml  Output 200 ml  Net 226.48 ml   Filed Weights   05/02/21 0500  Weight: 65.1 kg    Examination:  General exam: Appears calm and comfortable, pleasantly confused Respiratory system: Clear to auscultation. Respiratory effort normal.  On room air Cardiovascular system: S1 & S2 heard, RRR. No JVD, murmurs, rubs, gallops or clicks. No pedal edema. Gastrointestinal system: Abdomen is nondistended, soft and nontender. No organomegaly or masses felt. Normal bowel sounds heard. Central nervous system: Alert, not oriented to place/time/situation. No focal neurological deficits. Extremities: Symmetric 5 x 5 power. Skin:  No rashes, lesions or ulcers Psychiatry: Judgement and insight appear poor. Mood & affect appropriate.     Data Reviewed: I have personally reviewed following labs and imaging studies  CBC: Recent Labs  Lab 04/29/21 0104 05/01/21 0330 05/01/21 0355 05/02/21 0300  WBC 8.0 7.3  --  7.3  NEUTROABS  --  5.1  --   --   HGB 8.3* 8.4* 9.2* 8.6*  HCT 27.5* 27.4* 27.0* 28.0*  MCV 101.5* 99.3  --  98.2  PLT 272 284  --  154   Basic Metabolic Panel: Recent Labs  Lab 04/29/21 0104 05/01/21 0330 05/01/21 0355 05/02/21 0300  NA 140 132* 138 137  K 4.0 3.7 3.7 4.1  CL 110 104  --  104  CO2 19* 21*  --  23  GLUCOSE 117* 145*  --  118*  BUN 18 16  --  19  CREATININE 1.26* 1.17*  --  1.16*  CALCIUM 9.4 8.8*  --  9.3   GFR: Estimated Creatinine Clearance: 30.8 mL/min (A) (by C-G formula based on SCr of 1.16 mg/dL (H)). Liver Function Tests: Recent Labs  Lab 05/01/21 0330  AST 24  ALT 11  ALKPHOS 50  BILITOT 0.3  PROT 6.9  ALBUMIN 3.4*   No results for input(s): LIPASE, AMYLASE in the last 168 hours. No results for input(s): AMMONIA in the last 168 hours.  Coagulation Profile: Recent Labs  Lab 04/29/21 0104  INR 1.1   Cardiac Enzymes: No results for input(s): CKTOTAL, CKMB, CKMBINDEX, TROPONINI in the last 168 hours. BNP (last 3 results) No results for input(s): PROBNP in the last 8760 hours. HbA1C: No results for input(s): HGBA1C in the last 72 hours. CBG: No results for input(s): GLUCAP in the last 168 hours. Lipid Profile: No results for input(s): CHOL, HDL, LDLCALC, TRIG, CHOLHDL, LDLDIRECT in the last 72 hours. Thyroid Function Tests: No results for input(s): TSH, T4TOTAL, FREET4, T3FREE, THYROIDAB in the last 72 hours. Anemia Panel: No results for input(s): VITAMINB12, FOLATE, FERRITIN, TIBC, IRON, RETICCTPCT in the last 72 hours. Sepsis Labs: No results for input(s): PROCALCITON, LATICACIDVEN in the last 168 hours.  Recent Results (from the past 240  hour(s))  Resp Panel by RT-PCR (Flu A&B, Covid) Nasopharyngeal Swab     Status: None   Collection Time: 05/01/21  3:30 AM   Specimen: Nasopharyngeal Swab; Nasopharyngeal(NP) swabs in vial transport medium  Result Value Ref Range Status   SARS Coronavirus 2 by RT PCR NEGATIVE NEGATIVE Final    Comment: (NOTE) SARS-CoV-2 target nucleic acids are NOT DETECTED.  The SARS-CoV-2 RNA is generally detectable in upper respiratory specimens during the acute phase of infection. The lowest concentration of SARS-CoV-2 viral copies this assay can detect is 138 copies/mL. A negative result does not preclude SARS-Cov-2 infection and should not be used as the sole basis for treatment or other patient management decisions. A negative result may occur with  improper specimen collection/handling, submission of specimen other than nasopharyngeal swab, presence of viral mutation(s) within the areas targeted by this assay, and inadequate number of viral copies(<138 copies/mL). A negative result must be combined with clinical observations, patient history, and epidemiological information. The expected result is Negative.  Fact Sheet for Patients:  EntrepreneurPulse.com.au  Fact Sheet for Healthcare Providers:  IncredibleEmployment.be  This test is no t yet approved or cleared by the [place] FDA and  has been authorized for detection and/or diagnosis of SARS-CoV-2 by FDA under an Emergency Use Authorization (EUA). This EUA will remain  in effect (meaning this test can be used) for the duration of the COVID-19 declaration under Section 564(b)(1) of the Act, 21 U.S.C.section 360bbb-3(b)(1), unless the authorization is terminated  or revoked sooner.       Influenza A by PCR NEGATIVE NEGATIVE Final   Influenza B by PCR NEGATIVE NEGATIVE Final    Comment: (NOTE) The Xpert Xpress SARS-CoV-2/FLU/RSV plus assay is intended as an aid in the diagnosis of influenza from  Nasopharyngeal swab specimens and should not be used as a sole basis for treatment. Nasal washings and aspirates are unacceptable for Xpert Xpress SARS-CoV-2/FLU/RSV testing.  Fact Sheet for Patients: EntrepreneurPulse.com.au  Fact Sheet for Healthcare Providers: IncredibleEmployment.be  This test is not yet approved or cleared by the [place] FDA and has been authorized for detection and/or diagnosis of SARS-CoV-2 by FDA under an Emergency Use Authorization (EUA). This EUA will remain in effect (meaning this test can be used) for the duration of the COVID-19 declaration under Section 564(b)(1) of the Act, 21 U.S.C. section 360bbb-3(b)(1), unless the authorization is terminated or revoked.  Performed at [facility], [place] [address] [place], [place] [address]          Radiology Studies: DG Chest Portable 1 View  Result Date: 05/01/2021 CLINICAL DATA:  Dyspnea. EXAM: PORTABLE CHEST 1 VIEW COMPARISON:  Chest x-ray 04/29/2021, CT chest 04/29/2021 FINDINGS:  The heart size and mediastinal contours are unchanged. Referral hazy airspace opacities. No pulmonary edema. No pleural effusion. No pneumothorax. No acute osseous abnormality. IMPRESSION: Peripheral hazy airspace opacities. COVID-19 infection not excluded. Electronically Signed   By: Iven Finn M.D.   On: 05/01/2021 03:50   ECHOCARDIOGRAM COMPLETE  Result Date: 05/01/2021    ECHOCARDIOGRAM REPORT   Patient Name:   KARELI HOSSAIN Date of Exam: 05/01/2021 Medical Rec #:  [id number]          Height:       66.0 in Accession #:    [id number]         Weight:       140.0 lb Date of Birth:  [birth year]          BSA:          1.718 m Patient Age:    34 years           BP:           186/69 mmHg Patient Gender: F                  HR:           67 bpm. Exam Location:  Inpatient Procedure: 2D Echo, Cardiac Doppler and Color Doppler Indications:    CHF-Acute Systolic 774.12 / I78.67  History:         Patient has prior history of Echocardiogram examinations, most                 recent 12/26/2020. Acute MI.  Sonographer:    Merrie Roof Referring Phys: [place]  1. Severe hypokinesis of the anterior, septal and apical walls; overall moderate to severe LV dysfunction.  2. Left ventricular ejection fraction, by estimation, is 30 to 35%. The left ventricle has moderate to severely decreased function. The left ventricle demonstrates regional wall motion abnormalities (see scoring diagram/findings for description). Left ventricular diastolic parameters are consistent with Grade II diastolic dysfunction (pseudonormalization). Elevated left atrial pressure.  3. Right ventricular systolic function is normal. The right ventricular size is normal. There is severely elevated pulmonary artery systolic pressure.  4. Left atrial size was moderately dilated.  5. The mitral valve is abnormal. Mild mitral valve regurgitation. No evidence of mitral stenosis.  6. The aortic valve is tricuspid. Aortic valve regurgitation is trivial. Mild aortic valve sclerosis is present, with no evidence of aortic valve stenosis.  7. The inferior vena cava is normal in size with greater than 50% respiratory variability, suggesting right atrial pressure of 3 mmHg. FINDINGS  Left Ventricle: Left ventricular ejection fraction, by estimation, is 30 to 35%. The left ventricle has moderate to severely decreased function. The left ventricle demonstrates regional wall motion abnormalities. Definity contrast agent was given IV to delineate the left ventricular endocardial borders. The left ventricular internal cavity size was normal in size. There is no left ventricular hypertrophy. Left ventricular diastolic parameters are consistent with Grade II diastolic dysfunction (pseudonormalization). Elevated left atrial pressure. Right Ventricle: The right ventricular size is normal.Right ventricular systolic function is normal. There is  severely elevated pulmonary artery systolic pressure. The tricuspid regurgitant velocity is 3.82 m/s, and with an assumed right atrial pressure of 3 mmHg, the estimated right ventricular systolic pressure is 67.2 mmHg. Left Atrium: Left atrial size was moderately dilated. Right Atrium: Right atrial size was normal in size. Pericardium: There is no evidence of pericardial effusion. Mitral Valve: The mitral valve is abnormal. There is mild  thickening of the mitral valve leaflet(s). Mild mitral valve regurgitation. No evidence of mitral valve stenosis. Tricuspid Valve: The tricuspid valve is normal in structure. Tricuspid valve regurgitation is mild . No evidence of tricuspid stenosis. Aortic Valve: The aortic valve is tricuspid. Aortic valve regurgitation is trivial. Mild aortic valve sclerosis is present, with no evidence of aortic valve stenosis. Aortic valve mean gradient measures 4.0 mmHg. Aortic valve peak gradient measures 8.2 mmHg. Pulmonic Valve: The pulmonic valve was normal in structure. Pulmonic valve regurgitation is not visualized. No evidence of pulmonic stenosis. Aorta: The aortic root is normal in size and structure. Venous: The inferior vena cava is normal in size with greater than 50% respiratory variability, suggesting right atrial pressure of 3 mmHg. IAS/Shunts: No atrial level shunt detected by color flow Doppler. Additional Comments: Severe hypokinesis of the anterior, septal and apical walls; overall moderate to severe LV dysfunction.  LEFT VENTRICLE PLAX 2D LVIDd:         4.50 cm      Diastology LVIDs:         3.20 cm      LV e' medial:    3.89 cm/s LV PW:         0.90 cm      LV E/e' medial:  21.8 LV IVS:        0.90 cm      LV e' lateral:   6.09 cm/s LVOT diam:     1.60 cm      LV E/e' lateral: 13.9 LV SV:         43 LV SV Index:   25 LVOT Area:     2.01 cm  LV Volumes (MOD) LV vol d, MOD A2C: 151.0 ml LV vol d, MOD A4C: 120.0 ml LV vol s, MOD A2C: 97.1 ml LV vol s, MOD A4C: 69.0 ml LV SV  MOD A2C:     53.9 ml LV SV MOD A4C:     120.0 ml LV SV MOD BP:      54.9 ml RIGHT VENTRICLE RV Basal diam:  2.90 cm RV S prime:     7.95 cm/s TAPSE (M-mode): 1.8 cm LEFT ATRIUM             Index       RIGHT ATRIUM          Index LA diam:        3.60 cm 2.09 cm/m  RA Area:     9.28 cm LA Vol (A2C):   83.5 ml 48.59 ml/m RA Volume:   13.50 ml 7.86 ml/m LA Vol (A4C):   61.8 ml 35.96 ml/m LA Biplane Vol: 73.0 ml 42.48 ml/m  AORTIC VALVE AV Area (Vmax):    1.35 cm AV Area (Vmean):   1.43 cm AV Area (VTI):     1.48 cm AV Vmax:           143.00 cm/s AV Vmean:          92.300 cm/s AV VTI:            0.288 m AV Peak Grad:      8.2 mmHg AV Mean Grad:      4.0 mmHg LVOT Vmax:         96.00 cm/s LVOT Vmean:        65.700 cm/s LVOT VTI:          0.212 m LVOT/AV VTI ratio: 0.74  AORTA Ao Root diam: 2.70 cm Ao Asc diam:  2.70 cm MITRAL VALVE               TRICUSPID VALVE MV Area (PHT): 5.84 cm    TR Peak grad:   58.4 mmHg MV Decel Time: 130 msec    TR Vmax:        382.00 cm/s MV E velocity: 84.80 cm/s MV A velocity: 76.30 cm/s  SHUNTS MV E/A ratio:  1.11        Systemic VTI:  0.21 m                            Systemic Diam: 1.60 cm Kirk Ruths MD Electronically signed by Kirk Ruths MD Signature Date/Time: 05/01/2021/5:09:31 PM    Final         Scheduled Meds: . ALPRAZolam  0.5 mg Oral QHS  . aspirin EC  81 mg Oral Daily  . buPROPion  200 mg Oral Daily  . carvedilol  6.25 mg Oral BID  . clopidogrel  75 mg Oral Daily  . docusate sodium  100 mg Oral BID  . escitalopram  10 mg Oral Daily  . feeding supplement  237 mL Oral Q24H  . furosemide  40 mg Intravenous Daily  . levothyroxine  100 mcg Oral Daily  . losartan  12.5 mg Oral Daily  . pantoprazole  40 mg Oral Daily  . sodium chloride flush  3 mL Intravenous Q12H   Continuous Infusions: . heparin 750 Units/hr (05/01/21 0548)     LOS: 1 day    Time spent: 39 minutes spent on chart review, discussion with nursing staff, consultants, updating  family and interview/physical exam; more than 50% of that time was spent in counseling and/or coordination of care.    Ahlayah Tarkowski J [place] DO [facility] Available via Epic secure chat 7am-7pm After these hours, please refer to coverage provider listed on [email or web address] 05/02/2021, 10:58 AM

## 2021-05-02 NOTE — [place] FL2 (Addendum)
[place] MEDICAID [id number] LEVEL OF CARE SCREENING TOOL     IDENTIFICATION  Patient Name: Cindy [name] Birthdate: 1930-10-17 Sex: female Admission Date (Current Location): 05/01/2021  [facility] and [place] Number:  Herbalist and Address:  [facility], [place] [address], [place], [place] [address]      Provider Number: [id number]  Attending Physician Name and Address:  [place] Eric J, DO  Relative Name and Phone Number:  Mariella Saa [phone]    Current Level of Care: Hospital Recommended Level of Care: [facility]) Prior Approval Number:    Date Approved/Denied:   PASRR Number:    Discharge Plan: Other (Comment) ([facility])    Current Diagnoses: Patient Active Problem List   Diagnosis Date Noted  . NSTEMI (non-ST elevated myocardial infarction) ([place]) 05/01/2021  . Chronic combined systolic and diastolic heart failure ([place]) 05/01/2021  . CHF (congestive heart failure) ([place]) 08/14/2020  . DVT (deep venous thrombosis) ([place]) 07/29/2020  . Pulmonary embolus ([place]) 07/28/2020  . Anemia 07/28/2020  . Pulmonary embolism ([place]) 07/28/2020  . Acute respiratory failure with hypoxia ([place]) 07/28/2020  . Dementia with behavioral disturbance ([place])   . DNR (do not resuscitate)   . Palliative care by specialist   . Adult failure to thrive   . Acetabular fracture ([place]) 07/07/2020  . Sepsis ([place]) 03/09/2017  . Nausea vomiting and diarrhea 03/09/2017  . Elevated troponin 03/09/2017  . Chest pain 12/25/2013  . Dyspnea 12/25/2013  . Restless leg syndrome 12/25/2013  . Mucositis 10/27/2013  . Hypophosphatemia 10/27/2013  . Protein-calorie malnutrition, severe ([place]) 10/25/2013  . Thrombocytopenia ([place]) 10/25/2013  . Hypomagnesemia 10/24/2013  . Febrile neutropenia ([place]) 10/23/2013  . Anal cancer ([place]) 09/09/2013  . Colon cancer ([place]) 08/31/2013  . Colitis, acute 03/17/2013  . Gastroparesis 10/22/2012  .  Hand pain, left 10/22/2012  . Anxiety 10/18/2012  . Hyperventilation syndrome 10/18/2012  . Headache(784.0) 10/18/2012  . Neck pain on right side 10/18/2012  . Hypertension 10/18/2012  . GERD (gastroesophageal reflux disease) 10/18/2012  . Hyperlipidemia 10/18/2012  . Insomnia 10/18/2012  . Hearing loss sensory, bilateral 10/18/2012  . 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 Weakness generalized 10/17/2012  . Hyponatremia 10/17/2012  . Hypokalemia 10/17/2012  . Hypothyroidism 10/17/2012  . Fatigue 10/17/2012    Orientation RESPIRATION BLADDER Height & Weight     Self,Place,Situation  Normal Incontinent,External catheter (External Urinary Catheter) Weight: 143 lb 8.3 oz (65.1 kg) Height:     BEHAVIORAL SYMPTOMS/MOOD NEUROLOGICAL BOWEL NUTRITION STATUS      Continent (WDL) DIET: NAS  AMBULATORY STATUS COMMUNICATION OF NEEDS Skin   Limited Assist Verbally Normal                       Personal Care Assistance Level of Assistance  Bathing,Feeding,Dressing Bathing Assistance: Limited assistance Feeding assistance: Independent Dressing Assistance: Limited assistance     Functional Limitations Info  Sight,Hearing,Speech Sight Info: Impaired Hearing Info: Impaired (Right Ear hearing aid, Left Ear Deaf) Speech Info: Adequate    SPECIAL CARE FACTORS FREQUENCY  PT (By licensed PT),OT (By licensed OT)     PT Frequency: 3x min weekly OT Frequency: 3x min weekly            Contractures Contractures Info: Not present    Additional Factors Info  Code Status,Allergies,Psychotropic Code Status Info: DNR Allergies Info: Vancomycin,Zosyn (piperacillin Sod-tazobactam So),Hydrocodone,Morphine And Related Psychotropic Info: ALPRAZolam (XANAX) tablet 0.5 mg daily  at bedtime,buPROPion [facility] SR) 12 hr tablet 200 mg daily,escitalopram (LEXAPRO) tablet 20 mg daily         TAKE these medications   acetaminophen 325 MG tablet Commonly known as: TYLENOL Take 650  mg by mouth 3 (three) times daily as needed for moderate pain.   ALPRAZolam 0.5 MG tablet Commonly known as: XANAX Take 1 tablet (0.5 mg total) by mouth at bedtime. What changed: Another medication with the same name was added. Make sure you understand how and when to take each.   ALPRAZolam 0.5 MG tablet Commonly known as: XANAX Take 1 tablet (0.5 mg total) by mouth 3 (three) times daily as needed for anxiety. What changed: You were already taking a medication with the same name, and this prescription was added. Make sure you understand how and when to take each.   aspirin 81 MG EC tablet Take 1 tablet (81 mg total) by mouth daily. Swallow whole. Start taking on: May 04, 2021   buPROPion 200 MG 12 hr tablet Commonly known as: WELLBUTRIN SR Take 200 mg by mouth daily.   busPIRone 5 MG tablet Commonly known as: BUSPAR Take 1 tablet (5 mg total) by mouth 2 (two) times daily.   carvedilol 6.25 MG tablet Commonly known as: COREG Take 1 tablet (6.25 mg total) by mouth 2 (two) times daily.   clopidogrel 75 MG tablet Commonly known as: PLAVIX Take 1 tablet (75 mg total) by mouth daily. Start taking on: May 04, 2021   docusate sodium 100 MG capsule Commonly known as: COLACE Take 100 mg by mouth 2 (two) times daily.   escitalopram 10 MG tablet Commonly known as: LEXAPRO Take 2 tablets (20 mg total) by mouth daily. Start taking on: May 04, 2021 What changed: how much to take   gabapentin 300 MG capsule Commonly known as: NEURONTIN Take 300 mg by mouth 2 (two) times daily as needed (pain).   levothyroxine 100 MCG tablet Commonly known as: SYNTHROID Take 100 mcg by mouth daily.   loperamide 2 MG capsule Commonly known as: IMODIUM Take 2 mg by mouth as needed for diarrhea or loose stools.   losartan 25 MG tablet Commonly known as: COZAAR Take 0.5 tablets (12.5 mg total) by mouth daily. Start taking on: May 04, 2021 What changed: how much to take    ondansetron 4 MG disintegrating tablet Commonly known as: Zofran ODT Take 1 tablet (4 mg total) by mouth every 8 (eight) hours as needed for nausea or vomiting.   pantoprazole 40 MG tablet Commonly known as: PROTONIX Take 40 mg by mouth daily.   polyethylene glycol 17 g packet Commonly known as: MIRALAX / GLYCOLAX Take 17 g by mouth every other day. As needed for constipation     Relevant Imaging Results:  Relevant Lab Results:   Additional Information Clute Ronda Rajkumar, LCSWA

## 2021-05-02 NOTE — Progress Notes (Signed)
Daily Progress Note   Patient Name: Cindy [name]       Date: 05/02/2021 DOB: 1930/04/26  Age: 87 y.o. MRN#: [id number] Attending Physician: [place] Eric J, DO Primary Care Physician: Clovia Cuff, MD Admit Date: 05/01/2021  Reason for Consultation/Follow-up: goals of care  Subjective: Patient is alert and interactive. No acute complaints at present time. Remains on heparin infusion.   Daughter Mariella Saa is at bedside. Discussed that patient will likely be discharged back to assisted living facility tomorrow. Discussed/confirmed current MOST form on file; Mariella Saa does not wish to make changes to the form at this time.  Provided education and counseling on the philosophy and benefits of hospice care. Discussed that it offers a holistic approach to care in the setting of end-stage illness/disease, and is about supporting the patient where they are allowing nature to take it's course. Hospice can help a patient feel as good as possible for as long as possible. Discussed the hospice team includes RNs, physicians, social workers, and chaplains. They can provide personal care, support for the family, and help keep patient out of the hospital. Mariella Saa indicates they are not quite ready for hospice at this time.   I then offered and explained the option for an outpatient palliative referral to check-in periodically with patient and family and continue goals of care discussions. Discussed that outpatient palliative can also help with transition to hospice when her condition declines at some point.  Mariella Saa reports her mother received outpatient palliative services in the past with [facility] (part of [facility]) and would like to utilize this agency again. She expresses appreciation  for PMT support.   Also discussed the issue of patient's anxiety and panic attacks. I let Mariella Saa know that patient had been started on xanax three time daily as needed. Mariella Saa asks if the xanax could be scheduled; I provided education about side effects of benzodiazepines. Discussed that other options needed to be tried and failed first prior to starting scheduled xanax. Discussed starting buspirone, to which Sherian agrees.   Length of Stay: 1  Current Medications: Scheduled Meds:  . ALPRAZolam  0.5 mg Oral QHS  . aspirin EC  81 mg Oral Daily  . buPROPion  200 mg Oral Daily  . carvedilol  6.25 mg Oral BID  . clopidogrel  75  mg Oral Daily  . docusate sodium  100 mg Oral BID  . [START ON 05/03/2021] escitalopram  20 mg Oral Daily  . feeding supplement  237 mL Oral Q24H  . furosemide  40 mg Intravenous Daily  . levothyroxine  100 mcg Oral Daily  . losartan  12.5 mg Oral Daily  . pantoprazole  40 mg Oral Daily  . sodium chloride flush  3 mL Intravenous Q12H    Continuous Infusions: . heparin 750 Units/hr (05/02/21 1103)    PRN Meds: acetaminophen **OR** acetaminophen, ALPRAZolam, bisacodyl, gabapentin, hydrALAZINE, ondansetron **OR** ondansetron (ZOFRAN) IV, polyethylene glycol, traZODone  Physical Exam Vitals reviewed.  Constitutional:      General: She is not in acute distress.    Comments: Chronically ill-appearing  Cardiovascular:     Rate and Rhythm: Normal rate and regular rhythm.  Pulmonary:     Effort: Pulmonary effort is normal.  Neurological:     Mental Status: She is alert.  Psychiatric:        Cognition and Memory: Cognition is impaired.             Vital Signs: BP (!) 131/58 (BP Location: Left Arm)   Pulse 61   Temp 98.2 F (36.8 C) (Oral)   Resp 18   Wt 65.1 kg   SpO2 100%   BMI 23.16 kg/m  SpO2: SpO2: 100 % O2 Device: O2 Device: Room Air O2 Flow Rate: O2 Flow Rate (L/min): 2 L/min  Intake/output summary:   Intake/Output Summary (Last 24 hours)  at 05/02/2021 1659 Last data filed at 05/02/2021 1422 Gross per 24 hour  Intake 426.48 ml  Output 602 ml  Net -175.52 ml   LBM: Last BM Date: 04/30/21 Baseline Weight: Weight: 65.1 kg Most recent weight: Weight: 65.1 kg       Palliative Assessment/Data: PPS 30-40%       Palliative Care Assessment & Plan   HPI/Patient Profile: 87 y.o. 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  She presented to the emergency department on 05/01/2021 with worsening dyspnea, cough, and associated shortness of breath. Per family's report, these symptoms seemed to be consistent with prior panic attacks. On EMS arrival, spo2 was 88% with increased work of breathing and chest discomfort. She was placed on NRB with some improvement in symptoms.  ED Course: high-sensitivity troponin found to be elevated at 892>>1584. EKG stable compared to prior from 04/29/21. BNP noted to be elevated at 457. Chest x-ray showing peripheral hazy airspace opacities. Creatinine stable at 1.17 which appears to be baseline.  Per cardiology, plan is for IV heparin out of concern for ACS with no plans for invasive cardiac work-up.    Assessment: - NSTEMI - acute on chronic combined systolic and diastolic combined CHF - advanced dementia  - anxiety/panic attacks  Recommendations/Plan:  DNR/DNI as previously documented  Continue current medical care  Agree with alprazolam (Xanax) 0.5 mg three times daily PRN anxiety (would recommend continuing at discharge)  Start buspirone (BUSPAR) 5 mg twice daily for anxiety  Outpatient palliative [facility] with [facility] - TOC order placed and I spoke with hospice liaison   Scope of Treatment: There is a MOST on file in [place] from September 2020. The following treatment decisions are outlined:  Cardiopulmonary  Resuscitation: Do Not Attempt Resuscitation (DNR/No CPR)  Medical Interventions: Limited Additional Interventions: Use medical treatment, IV fluids and cardiac monitoring as  indicated, DO NOT USE intubation or mechanical ventilation. May consider use of less invasive airway support such as BiPAP or CPAP. Also provide comfort measures. Transfer to the hospital if indicated. Avoid intensive care.   Antibiotics: Antibiotics if indicated  IV Fluids: IV fluids if indicated  Feeding Tube: No feeding tube    Prognosis:  Difficult to determine, less than 6 months would not be surprising  Discharge Planning:  Return to assisted living facility with outpatient palliative to follow    Thank you for allowing the Palliative Medicine Team to assist in the care of this patient.   Total Time 35 minutes Prolonged Time Billed  no       Greater than 50%  of this time was spent counseling and coordinating care related to the above assessment and plan.  Lavena Bullion, NP  Please contact Palliative Medicine Team phone at [phone] for questions and concerns.

## 2021-05-02 NOTE — Progress Notes (Addendum)
Progress Note  Patient Name: Cindy [name] Date of Encounter: 05/02/2021  [facility] Cardiologist: Peter Martinique, MD   Subjective   Feeling well. No chest pain, sob or palpitations.   Inpatient Medications    Scheduled Meds: . ALPRAZolam  0.5 mg Oral QHS  . aspirin EC  81 mg Oral Daily  . buPROPion  200 mg Oral Daily  . carvedilol  6.25 mg Oral BID  . clopidogrel  75 mg Oral Daily  . docusate sodium  100 mg Oral BID  . escitalopram  10 mg Oral Daily  . furosemide  40 mg Intravenous Daily  . levothyroxine  100 mcg Oral Daily  . pantoprazole  40 mg Oral Daily  . sodium chloride flush  3 mL Intravenous Q12H   Continuous Infusions: . heparin 750 Units/hr (05/01/21 0548)   PRN Meds: acetaminophen **OR** acetaminophen, bisacodyl, gabapentin, hydrALAZINE, ondansetron **OR** ondansetron (ZOFRAN) IV, polyethylene glycol, traZODone   Vital Signs    Vitals:   05/02/21 0235 05/02/21 0335 05/02/21 0500 05/02/21 0728  BP: (!) 129/59 (!) 139/57  (!) 146/75  Pulse: 62 65  83  Resp:  16  18  Temp:  98.4 F (36.9 C)  98.1 F (36.7 C)  TempSrc:  Oral  Oral  SpO2: (!) 89% 90%  96%  Weight:   65.1 kg     Intake/Output Summary (Last 24 hours) at 05/02/2021 0943 Last data filed at 05/02/2021 0300 Gross per 24 hour  Intake 429.48 ml  Output 200 ml  Net 229.48 ml   Last 3 Weights 05/02/2021 04/19/2021 04/19/2021  Weight (lbs) 143 lb 8.3 oz 139 lb 15.9 oz 140 lb  Weight (kg) 65.1 kg 63.5 kg 63.504 kg      Telemetry    SR 60-80s- Personally Reviewed  ECG    N/A  Physical Exam   GEN: No acute distress.   Neck: No JVD Cardiac: RRR, no murmurs, rubs, or gallops.  Respiratory: Clear to auscultation bilaterally. GI: Soft, nontender, non-distended  MS: No edema; No deformity. Neuro:  Nonfocal  Psych: Dementia but answers questions appropriately  Labs    High Sensitivity Troponin:   Recent Labs  Lab 04/29/21 0104 04/29/21 0203 05/01/21 0330 05/01/21 0546   TROPONINIHS 18* 19* 892* 1,584*      Chemistry Recent Labs  Lab 04/29/21 0104 05/01/21 0330 05/01/21 0355 05/02/21 0300  NA 140 132* 138 137  K 4.0 3.7 3.7 4.1  CL 110 104  --  104  CO2 19* 21*  --  23  GLUCOSE 117* 145*  --  118*  BUN 18 16  --  19  CREATININE 1.26* 1.17*  --  1.16*  CALCIUM 9.4 8.8*  --  9.3  PROT  --  6.9  --   --   ALBUMIN  --  3.4*  --   --   AST  --  24  --   --   ALT  --  11  --   --   ALKPHOS  --  50  --   --   BILITOT  --  0.3  --   --   GFRNONAA 41* 45*  --  45*  ANIONGAP 11 7  --  10     Hematology Recent Labs  Lab 04/29/21 0104 05/01/21 0330 05/01/21 0355 05/02/21 0300  WBC 8.0 7.3  --  7.3  RBC 2.71* 2.76*  --  2.85*  HGB 8.3* 8.4* 9.2* 8.6*  HCT 27.5* 27.4* 27.0*  28.0*  MCV 101.5* 99.3  --  98.2  MCH 30.6 30.4  --  30.2  MCHC 30.2 30.7  --  30.7  RDW 13.7 14.0  --  13.8  PLT 272 284  --  288    BNP Recent Labs  Lab 05/01/21 0330  BNP 457.9*   Radiology    DG Chest Portable 1 View  Result Date: 05/01/2021 CLINICAL DATA:  Dyspnea. EXAM: PORTABLE CHEST 1 VIEW COMPARISON:  Chest x-ray 04/29/2021, CT chest 04/29/2021 FINDINGS: The heart size and mediastinal contours are unchanged. Referral hazy airspace opacities. No pulmonary edema. No pleural effusion. No pneumothorax. No acute osseous abnormality. IMPRESSION: Peripheral hazy airspace opacities. COVID-19 infection not excluded. Electronically Signed   By: Iven Finn M.D.   On: 05/01/2021 03:50   ECHOCARDIOGRAM COMPLETE  Result Date: 05/01/2021    ECHOCARDIOGRAM REPORT   Patient Name:   Cindy [name] Date of Exam: 05/01/2021 Medical Rec #:  [id number]          Height:       66.0 in Accession #:    [id number]         Weight:       140.0 lb Date of Birth:  10/16/1930          BSA:          1.718 m Patient Age:    87 years           BP:           186/69 mmHg Patient Gender: F                  HR:           67 bpm. Exam Location:  Inpatient Procedure: 2D Echo, Cardiac  Doppler and Color Doppler Indications:    CHF-Acute Systolic [id number] / [id number]  History:        Patient has prior history of Echocardiogram examinations, most                 recent 12/26/2020. Acute MI.  Sonographer:    Merrie Roof Referring Phys: [place]  1. Severe hypokinesis of the anterior, septal and apical walls; overall moderate to severe LV dysfunction.  2. Left ventricular ejection fraction, by estimation, is 30 to 35%. The left ventricle has moderate to severely decreased function. The left ventricle demonstrates regional wall motion abnormalities (see scoring diagram/findings for description). Left ventricular diastolic parameters are consistent with Grade II diastolic dysfunction (pseudonormalization). Elevated left atrial pressure.  3. Right ventricular systolic function is normal. The right ventricular size is normal. There is severely elevated pulmonary artery systolic pressure.  4. Left atrial size was moderately dilated.  5. The mitral valve is abnormal. Mild mitral valve regurgitation. No evidence of mitral stenosis.  6. The aortic valve is tricuspid. Aortic valve regurgitation is trivial. Mild aortic valve sclerosis is present, with no evidence of aortic valve stenosis.  7. The inferior vena cava is normal in size with greater than 50% respiratory variability, suggesting right atrial pressure of 3 mmHg. FINDINGS  Left Ventricle: Left ventricular ejection fraction, by estimation, is 30 to 35%. The left ventricle has moderate to severely decreased function. The left ventricle demonstrates regional wall motion abnormalities. Definity contrast agent was given IV to delineate the left ventricular endocardial borders. The left ventricular internal cavity size was normal in size. There is no left ventricular hypertrophy. Left ventricular diastolic parameters are consistent with Grade II diastolic dysfunction (pseudonormalization).  Elevated left atrial pressure. Right Ventricle: The  right ventricular size is normal.Right ventricular systolic function is normal. There is severely elevated pulmonary artery systolic pressure. The tricuspid regurgitant velocity is 3.82 m/s, and with an assumed right atrial pressure of 3 mmHg, the estimated right ventricular systolic pressure is 49.7 mmHg. Left Atrium: Left atrial size was moderately dilated. Right Atrium: Right atrial size was normal in size. Pericardium: There is no evidence of pericardial effusion. Mitral Valve: The mitral valve is abnormal. There is mild thickening of the mitral valve leaflet(s). Mild mitral valve regurgitation. No evidence of mitral valve stenosis. Tricuspid Valve: The tricuspid valve is normal in structure. Tricuspid valve regurgitation is mild . No evidence of tricuspid stenosis. Aortic Valve: The aortic valve is tricuspid. Aortic valve regurgitation is trivial. Mild aortic valve sclerosis is present, with no evidence of aortic valve stenosis. Aortic valve mean gradient measures 4.0 mmHg. Aortic valve peak gradient measures 8.2 mmHg. Pulmonic Valve: The pulmonic valve was normal in structure. Pulmonic valve regurgitation is not visualized. No evidence of pulmonic stenosis. Aorta: The aortic root is normal in size and structure. Venous: The inferior vena cava is normal in size with greater than 50% respiratory variability, suggesting right atrial pressure of 3 mmHg. IAS/Shunts: No atrial level shunt detected by color flow Doppler. Additional Comments: Severe hypokinesis of the anterior, septal and apical walls; overall moderate to severe LV dysfunction.  LEFT VENTRICLE PLAX 2D LVIDd:         4.50 cm      Diastology LVIDs:         3.20 cm      LV e' medial:    3.89 cm/s LV PW:         0.90 cm      LV E/e' medial:  21.8 LV IVS:        0.90 cm      LV e' lateral:   6.09 cm/s LVOT diam:     1.60 cm      LV E/e' lateral: 13.9 LV SV:         43 LV SV Index:   25 LVOT Area:     2.01 cm  LV Volumes (MOD) LV vol d, MOD A2C: 151.0 ml  LV vol d, MOD A4C: 120.0 ml LV vol s, MOD A2C: 97.1 ml LV vol s, MOD A4C: 69.0 ml LV SV MOD A2C:     53.9 ml LV SV MOD A4C:     120.0 ml LV SV MOD BP:      54.9 ml RIGHT VENTRICLE RV Basal diam:  2.90 cm RV S prime:     7.95 cm/s TAPSE (M-mode): 1.8 cm LEFT ATRIUM             Index       RIGHT ATRIUM          Index LA diam:        3.60 cm 2.09 cm/m  RA Area:     9.28 cm LA Vol (A2C):   83.5 ml 48.59 ml/m RA Volume:   13.50 ml 7.86 ml/m LA Vol (A4C):   61.8 ml 35.96 ml/m LA Biplane Vol: 73.0 ml 42.48 ml/m  AORTIC VALVE AV Area (Vmax):    1.35 cm AV Area (Vmean):   1.43 cm AV Area (VTI):     1.48 cm AV Vmax:           143.00 cm/s AV Vmean:          92.300  cm/s AV VTI:            0.288 m AV Peak Grad:      8.2 mmHg AV Mean Grad:      4.0 mmHg LVOT Vmax:         96.00 cm/s LVOT Vmean:        65.700 cm/s LVOT VTI:          0.212 m LVOT/AV VTI ratio: 0.74  AORTA Ao Root diam: 2.70 cm Ao Asc diam:  2.70 cm MITRAL VALVE               TRICUSPID VALVE MV Area (PHT): 5.84 cm    TR Peak grad:   58.4 mmHg MV Decel Time: 130 msec    TR Vmax:        382.00 cm/s MV E velocity: 84.80 cm/s MV A velocity: 76.30 cm/s  SHUNTS MV E/A ratio:  1.11        Systemic VTI:  0.21 m                            Systemic Diam: 1.60 cm Kirk Ruths MD Electronically signed by Kirk Ruths MD Signature Date/Time: 05/01/2021/5:09:31 PM    Final     Cardiac Studies   Echo 05/01/2021 1. Severe hypokinesis of the anterior, septal and apical walls; overall  moderate to severe LV dysfunction.  2. Left ventricular ejection fraction, by estimation, is 30 to 35%. The  left ventricle has moderate to severely decreased function. The left  ventricle demonstrates regional wall motion abnormalities (see scoring  diagram/findings for description). Left  ventricular diastolic parameters are consistent with Grade II diastolic  dysfunction (pseudonormalization). Elevated left atrial pressure.  3. Right ventricular systolic function is  normal. The right ventricular  size is normal. There is severely elevated pulmonary artery systolic  pressure.  4. Left atrial size was moderately dilated.  5. The mitral valve is abnormal. Mild mitral valve regurgitation. No  evidence of mitral stenosis.  6. The aortic valve is tricuspid. Aortic valve regurgitation is trivial.  Mild aortic valve sclerosis is present, with no evidence of aortic valve  stenosis.  7. The inferior vena cava is normal in size with greater than 50%  respiratory variability, suggesting right atrial pressure of 3 mmHg.   Patient Profile     87 y.o. female with a hx of hypertension, hyperlipidemia, hypothyroidism, advanced dementia, DVT/PE 07/2020 >>no longer on anticoagulation and chronic systolic CHF/cardiomyopathy with an LVEF at 35 to 40% seen for elevated troponin.  Assessment & Plan    1. Elevated troponin  - Hs-troponin 18>>19>>892>>1584 - Plan to treat with IV heparin for 48 hours. Conservative management.  - Chest pain free - Continue ASA 81mg  qd, Plavix 75mg  qd, Coreg 6.25mg  BID - Discussed long term benefit of statin;  Will defer at this time given long term prognosis.  2. Chronic systolic CHF - Echo this admission showed LVEF of 30-35% (was 35-40% ib 12/2020), grade II DD, elevated LA pressure. - No daily weight or accurate I & O - Appears close to euvolemic - Continue IV lasix 40mg  today and transitioned to po 40mg  qd tomorrow  - Continue Coreg 6.25mg  BID - Restart home Losartan at 1/2 dose at 12.5mg  qd (follow renal function closely and then titrate to home dose)  3. HTN -BP stable - Meds as discussed above  For questions or updates, please contact [place] Please consult [email or web address] for  contact info under        Signed, Leanor Kail, PA  05/02/2021, 9:43 AM    Personally seen and examined. Agree with APP above with the following comments and corrections made above: Briefly 87 yo F with dementia, NSTEMI, and  worsening HF Patient notes that she feels back to normal; still notes hip pain. Exam notable for holosystolic murmur Labs notable for stable kidney function Personally reviewed relevant tests Would recommend - DAPT now through DC; continue low dose Coreg and losartan; will get heparin for 48 hours, no statin planned at this time; DC on po lasix low dose.  Will need outpatient f/u - discussed at length with two daughters and with patient No change to [place] at this time.  Rudean Haskell, MD [address] [place], [place] [address] [phone]  11:02 AM

## 2021-05-02 NOTE — Progress Notes (Addendum)
Initial Nutrition Assessment  DOCUMENTATION CODES:   Not applicable  INTERVENTION:   Magic cup TID with meals, each supplement provides 290 kcal and 9 grams of protein  Encourage PO intake at meals.    NUTRITION DIAGNOSIS:   Inadequate oral intake related to decreased appetite as evidenced by meal completion < 50%.  GOAL:   Patient will meet greater than or equal to 90% of their needs  MONITOR:   PO intake,Supplement acceptance  REASON FOR ASSESSMENT:   Consult Assessment of nutrition requirement/status  ASSESSMENT:   Pt with PMH of hypothyroidism, HTN, HLD, remote colon cancer, DVT/PE, CHF with EF of 30-35%, and dementia with associated panic attacks admitted with chest pain and SOB dx with NSTEMI.   Palliative care is following the patient. Per documentation pt has a MOST form on file which indicates no feeding tube. Per palliative pt is appropriate for hospice care, in dicussion with family for [place].  Pt on room air.  Pt at risk for malnutrition due to chronic illnesses, plan to support nutrition with interventions per GOC.   Medications reviewed and include: colace, lasix, protonix  Labs reviewed: Troponin: 1584   UOP: 200 ml   Diet Order:   Diet Order            Diet Heart Room service appropriate? Yes; Fluid consistency: Thin; Fluid restriction: 1500 mL Fluid  Diet effective now                 EDUCATION NEEDS:   No education needs have been identified at this time  Skin:  Skin Assessment: Reviewed RN Assessment  Last BM:  5/10  Height:   Ht Readings from Last 1 Encounters:  04/19/21 5\' 6"  (1.676 m)    Weight:   Wt Readings from Last 1 Encounters:  05/02/21 65.1 kg    Ideal Body Weight:  59 kg  BMI:  Body mass index is 23.16 kg/m.  Estimated Nutritional Needs:   Kcal:  1600-1800  Protein:  80-90 grams  Fluid:  >1.5 L/day  Lockie Pares., RD, LDN, CNSC See AMiON for contact information

## 2021-05-02 NOTE — TOC Progression Note (Signed)
Transition of Care [facility]) - Progression Note    Patient Details  Name: Cindy [name] MRN: [id number] Date of Birth: 1930/04/28  Transition of Care [facility]) CM/SW [place], [place] Phone Number: 05/02/2021, 5:10 PM  Clinical Narrative:     CSW received consult for outpatient palliative services for patient. CSW [facility]. CSW will continue to follow and assist with discharge planning needs.         Expected Discharge Plan and Services                                                 Social Determinants of Health (SDOH) Interventions    Readmission Risk Interventions Readmission Risk Prevention Plan 07/10/2020  Transportation Screening Complete  PCP or Specialist Appt within 5-7 Days Complete  Home Care Screening Complete  Medication Review (RN CM) Complete  Some recent data might be hidden

## 2021-05-02 NOTE — Evaluation (Signed)
Occupational Therapy Evaluation Patient Details Name: Cindy [name] MRN: [id number] DOB: May 25, 1930 Today's Date: 05/02/2021    History of Present Illness Pt is an 87 y/o female admitted 5/10 secondary to increased SOB and chest pain. Per MD notes to be managed medically. PMH includes R hip fx, HTN, dementia, DVT/PE, CHF.   Clinical Impression   Pt admitted to ED for concerns listed above. PTA pt is living at an ALF, where she receives assistance with dressing and bathing. Pt reported that she was independent with transfers, using a RW and standing and pivoting to bed/recliner/wc/ bsc. At the time of the evaluation, pt presents with increased weakness from baseline, requiring min assist for bed mobility and min guard for functional mobility/transfers. Pt is expected to continue to progress with functional mobility and ADL performance. OT will continue following acutely to assist with progressing towards goals.     Follow Up Recommendations  Home health OT    Equipment Recommendations  None recommended by OT    Recommendations for Other Services       Precautions / Restrictions Precautions Precautions: Fall Restrictions Weight Bearing Restrictions: No Other Position/Activity Restrictions: nonoperable h/o R hip fx in 2021      Mobility Bed Mobility Overal bed mobility: Needs Assistance Bed Mobility: Supine to Sit     Supine to sit: Min assist     General bed mobility comments: Min Assist to elevate trunk    Transfers Overall transfer level: Needs assistance Equipment used: Rolling walker (2 wheeled) Transfers: Sit to/from [facility] Sit to Stand: Min guard Stand pivot transfers: Min guard       General transfer comment: No physical assist, min guard for safety    Balance Overall balance assessment: Needs assistance Sitting-balance support: Feet supported Sitting balance-Leahy Scale: Fair Sitting balance - Comments: Pt is able to maintain  balance better with 1 UE supporting, however with static sitting, pt can sit unsupported on the EOB   Standing balance support: Bilateral upper extremity supported Standing balance-Leahy Scale: Fair Standing balance comment: Static standing is good with bilateral UE support, with dynamic standing to complete functional tasks, pt requires min guard and balance drops down to fair.                           ADL either performed or assessed with clinical judgement   ADL Overall ADL's : Needs assistance/impaired Eating/Feeding: Independent;Sitting Eating/Feeding Details (indicate cue type and reason): Opened containers and lids and began eating breakfast at end of session. Grooming: Dance movement psychotherapist;Wash/dry hands;Set up;Sitting Grooming Details (indicate cue type and reason): Able to complete with a wash cloth seated EOB Upper Body Bathing: Min guard;Sitting Upper Body Bathing Details (indicate cue type and reason): Supervision at baseline, able to complete bathing, min guard due to weakness Lower Body Bathing: Moderate assistance;Sitting/lateral leans;Sit to/from stand Lower Body Bathing Details (indicate cue type and reason): At base line pt has assist with pericare and back, pt needs min guard when standing at this time for pericare to be completed. Upper Body Dressing : Set up;Sitting Upper Body Dressing Details (indicate cue type and reason): able to don and doff shirts with no difficulties, once set out for her. Lower Body Dressing: Moderate assistance;Sitting/lateral leans;Sit to/from stand Lower Body Dressing Details (indicate cue type and reason): Due to hip pain, pt requires assistance donning socks and shoes, as well as pulling briefs and pants to knees due to R hip fx. Toilet  Transfer: Min Geophysical data processor Details (indicate cue type and reason): Pt independent prior with transfers, needs min guard at this time due to weakness Toileting- Clothing  Manipulation and Hygiene: Min guard;Sitting/lateral lean Toileting - Clothing Manipulation Details (indicate cue type and reason): Pt able to complete pericare leaning on BSC, min guard for safety with leaning. Tub/ Shower Transfer: Min guard;Stand-pivot;Tub bench;Rolling walker Tub/Shower Transfer Details (indicate cue type and reason): Pt needs min guard transferring due to weakness, pt was at supervision at ALF/ Functional mobility during ADLs: Min guard;Rolling walker General ADL Comments: Pt is limited in functional mobility due to h/o R hip fx, however she is able to stand and pivot with a RW for transfers with min guard for safety. With these transfers pt can take a few small shuffle steps, and she maintains her standing balance well with static standing prior to and after transfer     Vision Baseline Vision/History: Macular Degeneration Patient Visual Report: No change from baseline Vision Assessment?: No apparent visual deficits     Perception Perception Perception Tested?: No   Praxis Praxis Praxis tested?: Not tested    Pertinent Vitals/Pain Pain Assessment: 0-10 Pain Score: 6  Pain Location: R hip Pain Descriptors / Indicators: Grimacing;Guarding Pain Intervention(s): Limited activity within patient's tolerance;Repositioned;Monitored during session     Hand Dominance Left   Extremity/Trunk Assessment Upper Extremity Assessment Upper Extremity Assessment: Overall WFL for tasks assessed (4+/5 MMT BUE, AROM WFL)   Lower Extremity Assessment Lower Extremity Assessment: Defer to PT evaluation   Cervical / Trunk Assessment Cervical / Trunk Assessment: Normal   Communication Communication Communication: HOH   Cognition Arousal/Alertness: Awake/alert Behavior During Therapy: WFL for tasks assessed/performed Overall Cognitive Status: History of cognitive impairments - at baseline                                 General Comments: Dementia at baseline    General Comments  VSS on RA. Pt on 2L O2 on arrival, satting at 100%, RN requested pt be taken off O2 when sitting up as long as sats remain stable. Pt satting at 93-94% for remainder of session.    Exercises     Shoulder Instructions      Home Living Family/patient expects to be discharged to:: Assisted living                             Home Equipment: Gilford Rile - 2 wheels;Bedside commode;Shower seat;Wheelchair - manual   Additional Comments: Per daughter, pt has been at ALF since being d/c'd from SNF following hip fx.      Prior Functioning/Environment Level of Independence: Needs assistance  Gait / Transfers Assistance Needed: Uses WC at baseline. Daughter reports pt is independent with transfers. Staff can assist if needed. PT sees pt 2X/week ADL's / Homemaking Assistance Needed: Staff assists with ADLs and IADLs.            OT Problem List: Decreased strength;Decreased activity tolerance;Impaired balance (sitting and/or standing);Decreased coordination;Decreased cognition;Decreased safety awareness;Decreased knowledge of use of DME or AE      OT Treatment/Interventions: Self-care/ADL training;Therapeutic exercise;Energy conservation;DME and/or AE instruction;Therapeutic activities;Cognitive remediation/compensation;Patient/family education;Balance training    OT Goals(Current goals can be found in the care plan section) Acute Rehab OT Goals Patient Stated Goal: To go back home OT Goal Formulation: With patient/family Time For Goal Achievement: 05/16/21 Potential to Achieve Goals:  Good ADL Goals Pt Will Perform Grooming: with modified independence;sitting Pt Will Transfer to Toilet: with modified independence;stand pivot transfer;bedside commode Pt Will Perform Toileting - Clothing Manipulation and hygiene: with modified independence;sitting/lateral leans Additional ADL Goal #1: Pt will report 3 fall prevention techniques  OT Frequency: Min 2X/week    Barriers to D/C:            Co-evaluation              AM-PAC OT "6 Clicks" Daily Activity     Outcome Measure Help from another person eating meals?: None Help from another person taking care of personal grooming?: A Little Help from another person toileting, which includes using toliet, bedpan, or urinal?: A Little Help from another person bathing (including washing, rinsing, drying)?: A Lot Help from another person to put on and taking off regular upper body clothing?: A Little Help from another person to put on and taking off regular lower body clothing?: A Lot 6 Click Score: 17   End of Session Equipment Utilized During Treatment: Gait belt;Rolling walker Nurse Communication: Mobility status  Activity Tolerance: Patient tolerated treatment well Patient left: in chair;with call bell/phone within reach;with family/visitor present;with nursing/sitter in room  OT Visit Diagnosis: Unsteadiness on feet (R26.81);Other abnormalities of gait and mobility (R26.89);Muscle weakness (generalized) (M62.81)                Time: 3570-1779 OT Time Calculation (min): 25 min Charges:  OT General Charges $OT Visit: 1 Visit OT Evaluation $OT Eval Moderate Complexity: 1 Mod OT Treatments $Self Care/Home Management : 8-22 mins  Osbaldo Mark H., OTR/L Acute Rehabilitation  Betsie Peckman Elane Wing Schoch 05/02/2021, 10:31 AM

## 2021-05-03 LAB — BASIC METABOLIC PANEL
Anion gap: 7 (ref 5–15)
BUN: 22 mg/dL (ref 8–23)
CO2: 26 mmol/L (ref 22–32)
Calcium: 9 mg/dL (ref 8.9–10.3)
Chloride: 102 mmol/L (ref 98–111)
Creatinine, Ser: 1.52 mg/dL — ABNORMAL HIGH (ref 0.44–1.00)
GFR, Estimated: 33 mL/min — ABNORMAL LOW (ref >60)
Glucose, Bld: 103 mg/dL — ABNORMAL HIGH (ref 70–99)
Potassium: 3.8 mmol/L (ref 3.5–5.1)
Sodium: 135 mmol/L (ref 135–145)

## 2021-05-03 LAB — MAGNESIUM: Magnesium: 1.8 mg/dL (ref 1.7–2.4)

## 2021-05-03 LAB — CBC
HCT: 26.1 % — ABNORMAL LOW (ref 36.0–46.0)
Hemoglobin: 8.2 g/dL — ABNORMAL LOW (ref 12.0–15.0)
MCH: 29.9 pg (ref 26.0–34.0)
MCHC: 31.4 g/dL (ref 30.0–36.0)
MCV: 95.3 fL (ref 80.0–100.0)
Platelets: 287 10*3/uL (ref 150–400)
RBC: 2.74 MIL/uL — ABNORMAL LOW (ref 3.87–5.11)
RDW: 14 % (ref 11.5–15.5)
WBC: 6.3 10*3/uL (ref 4.0–10.5)
nRBC: 0 % (ref 0.0–0.2)

## 2021-05-03 LAB — HEPARIN LEVEL (UNFRACTIONATED): Heparin Unfractionated: 0.43 IU/mL (ref 0.30–0.70)

## 2021-05-03 MED ORDER — BUSPIRONE HCL 5 MG PO TABS: 5.0000 mg | ORAL_TABLET | Freq: Two times a day (BID) | ORAL | 0 refills | Status: AC

## 2021-05-03 MED ORDER — LOSARTAN POTASSIUM 25 MG PO TABS: 12.5000 mg | ORAL_TABLET | Freq: Every day | ORAL | 0 refills | Status: DC

## 2021-05-03 MED ORDER — CLOPIDOGREL BISULFATE 75 MG PO TABS: 75.0000 mg | ORAL_TABLET | Freq: Every day | ORAL | 11 refills | Status: DC

## 2021-05-03 MED ORDER — ASPIRIN 81 MG PO TBEC: 81.0000 mg | DELAYED_RELEASE_TABLET | Freq: Every day | ORAL | 11 refills | Status: DC

## 2021-05-03 MED ORDER — ALPRAZOLAM 0.5 MG PO TABS: 0.5000 mg | ORAL_TABLET | Freq: Every day | ORAL | 0 refills | Status: AC

## 2021-05-03 MED ORDER — ESCITALOPRAM OXALATE 10 MG PO TABS: 20.0000 mg | ORAL_TABLET | Freq: Every day | ORAL | 0 refills | Status: DC

## 2021-05-03 MED ORDER — ALPRAZOLAM 0.5 MG PO TABS: 0.5000 mg | ORAL_TABLET | Freq: Three times a day (TID) | ORAL | 0 refills | Status: AC | PRN

## 2021-05-03 NOTE — Progress Notes (Signed)
Physical Therapy Treatment Patient Details Name: Cindy [name] MRN: [id number] DOB: [birth year] Today's Date: 05/03/2021    History of Present Illness Pt is an 87 y.o. female admitted from ALF on 04/30/21 with SOB and chest pain. Workup for NSTEMI; conservative management. PMH includes R hip fx, HTN, DVT/PE, CHF, dementia.   PT Comments    Pt progressing with mobility. Today's session focused on transfer training with RW; pt able to tolerate sit<>stands and take a few steps with RW and min guard. Pt limited by fatigue and chronic R hip pain from previous injury. Pt's daughter present and supportive; preparing for pt's discharge back to ALF today.    Follow Up Recommendations  Home health PT;Supervision for mobility/OOB (at ALF)     Equipment Recommendations  None recommended by PT    Recommendations for Other Services       Precautions / Restrictions Precautions Precautions: Fall Restrictions Other Position/Activity Restrictions: H/o R hip fx in 2021 (non-op management)    Mobility  Bed Mobility Overal bed mobility: Needs Assistance Bed Mobility: Supine to Sit     Supine to sit: Min assist;HOB elevated     General bed mobility comments: Light minA for trunk elevation    Transfers Overall transfer level: Needs assistance Equipment used: Rolling walker (2 wheeled) Transfers: Sit to/from Stand Sit to Stand: Min guard         General transfer comment: Cues for hand placement as pt pulling on RW despite it tipping over; once pushing from bed, able to stand with min guard for balance, no physical assist  Ambulation/Gait Ambulation/Gait assistance: Min guard Gait Distance (Feet): 1 Feet Assistive device: Rolling walker (2 wheeled) Gait Pattern/deviations: Step-to pattern;Antalgic;Decreased weight shift to right;Trunk flexed Gait velocity: Decreased   General Gait Details: Slow, antalgic steps from bed to recliner with RW and min guard; pt limited by R hip  pain   Stairs             Wheelchair Mobility    Modified Rankin (Stroke Patients Only)       Balance Overall balance assessment: Needs assistance   Sitting balance-Leahy Scale: Fair     Standing balance support: Bilateral upper extremity supported Standing balance-Leahy Scale: Poor Standing balance comment: Reliant on UE support for static and dynamic standing balance                            Cognition Arousal/Alertness: Awake/alert Behavior During Therapy: WFL for tasks assessed/performed Overall Cognitive Status: History of cognitive impairments - at baseline                                 General Comments: H/o dementia. Following majority of simple commands well, sometimes with increased time      Exercises      General Comments General comments (skin integrity, edema, etc.): VSS on RA      Pertinent Vitals/Pain Pain Assessment: Faces Faces Pain Scale: Hurts even more Pain Location: R hip Pain Descriptors / Indicators: Grimacing;Guarding Pain Intervention(s): Limited activity within patient's tolerance;Monitored during session;Repositioned;RN gave pain meds during session    Home Living                      Prior Function            PT Goals (current goals can now be found in the care plan  section) Progress towards PT goals: Progressing toward goals    Frequency    Min 3X/week      PT Plan Current plan remains appropriate    Co-evaluation              AM-PAC PT "6 Clicks" Mobility   Outcome Measure  Help needed turning from your back to your side while in a flat bed without using bedrails?: A Little Help needed moving from lying on your back to sitting on the side of a flat bed without using bedrails?: A Little Help needed moving to and from a bed to a chair (including a wheelchair)?: A Little Help needed standing up from a chair using your arms (e.g., wheelchair or bedside chair)?: A  Little Help needed to walk in hospital room?: A Little Help needed climbing 3-5 steps with a railing? : A Lot 6 Click Score: 17    End of Session   Activity Tolerance: Patient tolerated treatment well;Patient limited by pain Patient left: in chair;with call bell/phone within reach;with nursing/sitter in room;with family/visitor present;Other (comment) (with MD present) Nurse Communication: Mobility status PT Visit Diagnosis: Difficulty in walking, not elsewhere classified (R26.2);Pain     Time: 3846-6599 PT Time Calculation (min) (ACUTE ONLY): 18 min  Charges:  $Therapeutic Activity: 8-22 mins                     Mabeline Caras, PT, DPT Acute Rehabilitation Services  Pager [phone] Office [place] 05/03/2021, 12:03 PM

## 2021-05-03 NOTE — TOC Transition Note (Addendum)
Transition of Care [facility]) - CM/SW Discharge Note   Patient Details  Name: Cindy [name] MRN: [id number] Date of Birth: 03-12-1930  [facility]) CM/SW Contact:  Trula Ore, [place] Phone Number: 05/03/2021, 12:37 PM   Clinical Narrative:     [facility] to[facility]   Anticipated DC date: 05/03/2021  Family notified: Mariella Saa   Transport by: patients daughter Mylena   ?  Per MD patient ready for DC to [facility] with palliative to follow . RN, patient, patient's family,cheri with [facility], and facility notified of DC. Discharge Summary sent to facility. RN given number for report tele#[phone] RM#[id number]. DC packet on chart.DNR signed by MD attached to patients DC packet.Patient to be transported by sister Mylena.  CSW signing off.    Final next level of care: Assisted Living [facility]) Barriers to Discharge: No Barriers Identified   Patient Goals and CMS Choice   CMS Medicare.gov Compare Post Acute Care list provided to:: Patient Represenative (must comment) (daughter Mariella Saa) Choice offered to / list presented to : Adult Children (daughter Mariella Saa)  Discharge Placement              Patient chooses bed at: [facility]) Patient to be transferred to facility by: patients daughter Mylena Name of family member notified: [place] Patient and family notified of of transfer: 05/03/21  Discharge Plan and Services                                     Social Determinants of Health (SDOH) Interventions     Readmission Risk Interventions Readmission Risk Prevention Plan 07/10/2020  Transportation Screening Complete  PCP or Specialist Appt within 5-7 Days Complete  Home Care Screening Complete  Medication Review (RN CM) Complete  Some recent data might be hidden

## 2021-05-03 NOTE — Progress Notes (Addendum)
Progress Note  Patient Name: Cindy [name] Date of Encounter: 05/03/2021  [facility] Cardiologist: Peter Martinique, MD   Subjective   Feeling well. No chest pain, sob or palpitations.   Inpatient Medications    Scheduled Meds: . ALPRAZolam  0.5 mg Oral QHS  . aspirin EC  81 mg Oral Daily  . buPROPion  200 mg Oral Daily  . busPIRone  5 mg Oral BID  . carvedilol  6.25 mg Oral BID  . clopidogrel  75 mg Oral Daily  . docusate sodium  100 mg Oral BID  . escitalopram  20 mg Oral Daily  . feeding supplement  237 mL Oral Q24H  . furosemide  40 mg Intravenous Daily  . levothyroxine  100 mcg Oral Daily  . losartan  12.5 mg Oral Daily  . pantoprazole  40 mg Oral Daily  . sodium chloride flush  3 mL Intravenous Q12H   Continuous Infusions: . heparin 750 Units/hr (05/02/21 1103)   PRN Meds: acetaminophen **OR** acetaminophen, ALPRAZolam, bisacodyl, gabapentin, hydrALAZINE, ondansetron **OR** ondansetron (ZOFRAN) IV, polyethylene glycol, traZODone   Vital Signs    Vitals:   05/02/21 2027 05/03/21 0011 05/03/21 0501 05/03/21 0819  BP: (!) 116/51 128/63 (!) 116/93 (!) 143/60  Pulse: 78  60   Resp: 16 16 15 18   Temp: 98.9 F (37.2 C) 98 F (36.7 C) 98.6 F (37 C) 98 F (36.7 C)  TempSrc: Oral  Oral Oral  SpO2: 97%  96%   Weight:   62.8 kg     Intake/Output Summary (Last 24 hours) at 05/03/2021 0919 Last data filed at 05/02/2021 1742 Gross per 24 hour  Intake 1000 ml  Output 402 ml  Net 598 ml   Last 3 Weights 05/03/2021 05/02/2021 04/19/2021  Weight (lbs) 138 lb 6.4 oz 143 lb 8.3 oz 139 lb 15.9 oz  Weight (kg) 62.778 kg 65.1 kg 63.5 kg      Telemetry    SR - Personally Reviewed  ECG    N/A  Physical Exam   GEN: No acute distress.   Neck: No JVD Cardiac: RRR, holo systolic  murmurs, rubs, or gallops.  Respiratory: Clear to auscultation bilaterally. GI: Soft, nontender, non-distended  MS: No edema; No deformity. Neuro:  Nonfocal  Psych: Normal affect    Labs    High Sensitivity Troponin:   Recent Labs  Lab 04/29/21 0104 04/29/21 0203 05/01/21 0330 05/01/21 0546  TROPONINIHS 18* 19* 892* 1,584*      Chemistry Recent Labs  Lab 05/01/21 0330 05/01/21 0355 05/02/21 0300 05/03/21 0257  NA 132* 138 137 135  K 3.7 3.7 4.1 3.8  CL 104  --  104 102  CO2 21*  --  23 26  GLUCOSE 145*  --  118* 103*  BUN 16  --  19 22  CREATININE 1.17*  --  1.16* 1.52*  CALCIUM 8.8*  --  9.3 9.0  PROT 6.9  --   --   --   ALBUMIN 3.4*  --   --   --   AST 24  --   --   --   ALT 11  --   --   --   ALKPHOS 50  --   --   --   BILITOT 0.3  --   --   --   GFRNONAA 45*  --  45* 33*  ANIONGAP 7  --  10 7     Hematology Recent Labs  Lab 05/01/21 0330  05/01/21 0355 05/02/21 0300 05/03/21 0257  WBC 7.3  --  7.3 6.3  RBC 2.76*  --  2.85* 2.74*  HGB 8.4* 9.2* 8.6* 8.2*  HCT 27.4* 27.0* 28.0* 26.1*  MCV 99.3  --  98.2 95.3  MCH 30.4  --  30.2 29.9  MCHC 30.7  --  30.7 31.4  RDW 14.0  --  13.8 14.0  PLT 284  --  288 287    BNP Recent Labs  Lab 05/01/21 0330  BNP 457.9*    Radiology    ECHOCARDIOGRAM COMPLETE  Result Date: 05/01/2021    ECHOCARDIOGRAM REPORT   Patient Name:   Cindy [name] Date of Exam: 05/01/2021 Medical Rec #:  [id number]          Height:       66.0 in Accession #:    [id number]         Weight:       140.0 lb Date of Birth:  October 08, 1930          BSA:          1.718 m Patient Age:    87 years           BP:           186/69 mmHg Patient Gender: F                  HR:           67 bpm. Exam Location:  Inpatient Procedure: 2D Echo, Cardiac Doppler and Color Doppler Indications:    CHF-Acute Systolic [id number] / [id number]  History:        Patient has prior history of Echocardiogram examinations, most                 recent 12/26/2020. Acute MI.  Sonographer:    Merrie Roof Referring Phys: [place]  1. Severe hypokinesis of the anterior, septal and apical walls; overall moderate to severe LV dysfunction.  2.  Left ventricular ejection fraction, by estimation, is 30 to 35%. The left ventricle has moderate to severely decreased function. The left ventricle demonstrates regional wall motion abnormalities (see scoring diagram/findings for description). Left ventricular diastolic parameters are consistent with Grade II diastolic dysfunction (pseudonormalization). Elevated left atrial pressure.  3. Right ventricular systolic function is normal. The right ventricular size is normal. There is severely elevated pulmonary artery systolic pressure.  4. Left atrial size was moderately dilated.  5. The mitral valve is abnormal. Mild mitral valve regurgitation. No evidence of mitral stenosis.  6. The aortic valve is tricuspid. Aortic valve regurgitation is trivial. Mild aortic valve sclerosis is present, with no evidence of aortic valve stenosis.  7. The inferior vena cava is normal in size with greater than 50% respiratory variability, suggesting right atrial pressure of 3 mmHg. FINDINGS  Left Ventricle: Left ventricular ejection fraction, by estimation, is 30 to 35%. The left ventricle has moderate to severely decreased function. The left ventricle demonstrates regional wall motion abnormalities. Definity contrast agent was given IV to delineate the left ventricular endocardial borders. The left ventricular internal cavity size was normal in size. There is no left ventricular hypertrophy. Left ventricular diastolic parameters are consistent with Grade II diastolic dysfunction (pseudonormalization). Elevated left atrial pressure. Right Ventricle: The right ventricular size is normal.Right ventricular systolic function is normal. There is severely elevated pulmonary artery systolic pressure. The tricuspid regurgitant velocity is 3.82 m/s, and with an assumed right atrial pressure of 3 mmHg,  the estimated right ventricular systolic pressure is 53.6 mmHg. Left Atrium: Left atrial size was moderately dilated. Right Atrium: Right atrial  size was normal in size. Pericardium: There is no evidence of pericardial effusion. Mitral Valve: The mitral valve is abnormal. There is mild thickening of the mitral valve leaflet(s). Mild mitral valve regurgitation. No evidence of mitral valve stenosis. Tricuspid Valve: The tricuspid valve is normal in structure. Tricuspid valve regurgitation is mild . No evidence of tricuspid stenosis. Aortic Valve: The aortic valve is tricuspid. Aortic valve regurgitation is trivial. Mild aortic valve sclerosis is present, with no evidence of aortic valve stenosis. Aortic valve mean gradient measures 4.0 mmHg. Aortic valve peak gradient measures 8.2 mmHg. Pulmonic Valve: The pulmonic valve was normal in structure. Pulmonic valve regurgitation is not visualized. No evidence of pulmonic stenosis. Aorta: The aortic root is normal in size and structure. Venous: The inferior vena cava is normal in size with greater than 50% respiratory variability, suggesting right atrial pressure of 3 mmHg. IAS/Shunts: No atrial level shunt detected by color flow Doppler. Additional Comments: Severe hypokinesis of the anterior, septal and apical walls; overall moderate to severe LV dysfunction.  LEFT VENTRICLE PLAX 2D LVIDd:         4.50 cm      Diastology LVIDs:         3.20 cm      LV e' medial:    3.89 cm/s LV PW:         0.90 cm      LV E/e' medial:  21.8 LV IVS:        0.90 cm      LV e' lateral:   6.09 cm/s LVOT diam:     1.60 cm      LV E/e' lateral: 13.9 LV SV:         43 LV SV Index:   25 LVOT Area:     2.01 cm  LV Volumes (MOD) LV vol d, MOD A2C: 151.0 ml LV vol d, MOD A4C: 120.0 ml LV vol s, MOD A2C: 97.1 ml LV vol s, MOD A4C: 69.0 ml LV SV MOD A2C:     53.9 ml LV SV MOD A4C:     120.0 ml LV SV MOD BP:      54.9 ml RIGHT VENTRICLE RV Basal diam:  2.90 cm RV S prime:     7.95 cm/s TAPSE (M-mode): 1.8 cm LEFT ATRIUM             Index       RIGHT ATRIUM          Index LA diam:        3.60 cm 2.09 cm/m  RA Area:     9.28 cm LA Vol (A2C):    83.5 ml 48.59 ml/m RA Volume:   13.50 ml 7.86 ml/m LA Vol (A4C):   61.8 ml 35.96 ml/m LA Biplane Vol: 73.0 ml 42.48 ml/m  AORTIC VALVE AV Area (Vmax):    1.35 cm AV Area (Vmean):   1.43 cm AV Area (VTI):     1.48 cm AV Vmax:           143.00 cm/s AV Vmean:          92.300 cm/s AV VTI:            0.288 m AV Peak Grad:      8.2 mmHg AV Mean Grad:      4.0 mmHg LVOT Vmax:  96.00 cm/s LVOT Vmean:        65.700 cm/s LVOT VTI:          0.212 m LVOT/AV VTI ratio: 0.74  AORTA Ao Root diam: 2.70 cm Ao Asc diam:  2.70 cm MITRAL VALVE               TRICUSPID VALVE MV Area (PHT): 5.84 cm    TR Peak grad:   58.4 mmHg MV Decel Time: 130 msec    TR Vmax:        382.00 cm/s MV E velocity: 84.80 cm/s MV A velocity: 76.30 cm/s  SHUNTS MV E/A ratio:  1.11        Systemic VTI:  0.21 m                            Systemic Diam: 1.60 cm Kirk Ruths MD Electronically signed by Kirk Ruths MD Signature Date/Time: 05/01/2021/5:09:31 PM    Final     Cardiac Studies    Echo 05/01/2021 1. Severe hypokinesis of the anterior, septal and apical walls; overall  moderate to severe LV dysfunction.  2. Left ventricular ejection fraction, by estimation, is 30 to 35%. The  left ventricle has moderate to severely decreased function. The left  ventricle demonstrates regional wall motion abnormalities (see scoring  diagram/findings for description). Left  ventricular diastolic parameters are consistent with Grade II diastolic  dysfunction (pseudonormalization). Elevated left atrial pressure.  3. Right ventricular systolic function is normal. The right ventricular  size is normal. There is severely elevated pulmonary artery systolic  pressure.  4. Left atrial size was moderately dilated.  5. The mitral valve is abnormal. Mild mitral valve regurgitation. No  evidence of mitral stenosis.  6. The aortic valve is tricuspid. Aortic valve regurgitation is trivial.  Mild aortic valve sclerosis is present, with no  evidence of aortic valve  stenosis.  7. The inferior vena cava is normal in size with greater than 50%  respiratory variability, suggesting right atrial pressure of 3 mmHg.   Patient Profile     87 y.o. female with a hx of hypertension, hyperlipidemia, hypothyroidism, advanced dementia, DVT/PE8/2021>>no longer on anticoagulation and chronic systolic CHF/cardiomyopathywith an LVEF at 35 to 40% seen for elevated troponin.  Assessment & Plan    1. Elevated troponin / NSTEMI - Hs-troponin 18>>19>>892>>1584 - Plan to treat with IV heparin for 48 hours. Conservative management.  - Chest pain free - Continue ASA 81mg  qd, Plavix 75mg  qd (planned for one year), Coreg 6.25mg  BID - Statin avoided given long term prognosis.  2. Chronic systolic CHF - Echo this admission showed LVEF of 30-35% (was 35-40% ib 12/2020), grade II DD, elevated LA pressure. - Appears close to euvolemic - treated with IV lasix 40mg ; holding further lasix at this time  - Continue Coreg 6.25mg  BID - Restarted home Losartan at 1/2 dose at 12.5mg  qd   3. HTN -BP stable on current medications   4. AKI - Scr 1.17>>1.16>>1.52 today  - Hold lasix and can re-evaluation as outpatient  Likely DC later today. BMP in one week.    For questions or updates, please contact [place] Please consult [email or web address] for contact info under        SignedLeanor Kail, PA  05/03/2021, 9:19 AM    Personally seen and examined. Agree with APP above with the following comments or corrections as above: Briefly 87 yo F with dementia , HFrEF, and  NSTEMI Patient notes that she feels back to baseline; still have hip pain; daughter thinks this is from being in bed so much. Exam notable for no JVD.  Benign exame Labs notable for increase in creatinine Would recommend    [facility] will sign off.   Medication Recommendations:  DAPT for one year, low dose corge and losartan; may need outpatient diuresis Other  recommendations (labs, testing, etc):  BMP in one week (can be done at [facility]) Follow up as an outpatient:  Arranging follow up  Rudean Haskell, MD Cardiologist [facility]  [address] [place], [place] [address] [phone]  10:54 AM

## 2021-05-03 NOTE — Consult Note (Signed)
   [facility] CM Inpatient Consult   05/03/2021  DETRIA CUMMINGS 08/29/1930 [id number]  [facility] [ACO] Patient:  Medicare CMS DCE  Patient evaluated for community based chronic complex disease management services with [place] as a benefit of patient's [employer]. Patient is to return to [facility] with [place].  Primary Care Provider: Clovia Cuff, MD which is currently not a [facility] affiliated provider.  Plan: Patient needs to be met at [facility] facility for transition of care.    Of note, [facility] Care Management services does not replace or interfere with any services that are arranged by inpatient case management or social work.    For additional questions or referrals please contact:    Natividad Brood, RN BSN [facility] Liaison  [phone] business mobile phone Toll free office [phone]  Fax number: [phone] [email or web address]  [email or web address]

## 2021-05-03 NOTE — Discharge Summary (Signed)
Physician Discharge Summary  Cindy [name] [id number] DOB: 26-Jan-1930 DOA: 05/01/2021  PCP: Clovia Cuff, MD  Admit date: 05/01/2021 Discharge date: 05/03/2021  Ad[facility] From: [facility] Disposition: [facility]  Recommendations for Outpatient Follow-up:  1. Follow up with PCP in 1-2 weeks 2. Follow-up with cardiology, Sande Rives scheduled on 05/21/2021 at 11:15 AM 3. Continued dual antiplatelet therapy with aspirin and Plavix x1 year per cardiology 4. Losartan decreased to 12.5 mg p.o. daily 5. Started on BuSpar 5 mg p.o. twice daily for anxiety 6. Increased Lexapro to 20 mg p.o. daily 7. Continue Xanax 0.5 mg p.o. nightly, also 3 times daily as needed for anxiety/behavioral disturbance 8. Please obtain BMP in one week 9. 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  Patient reported severe chest pain associated with dyspnea.  Patient presenting from [place].  In the ED, temperature 97.1 F, HR 65, RR 31, BP 160/82, SPO2 100% on 4 L nasal cannula.  Sodium 132, potassium 3.7, chloride 104, CO2 21, glucose 145, BUN 16, creatinine 1.17.  AST 24, ALT 11, total bilirubin 0.3.  WBC 7.3, hemoglobin 8.4, platelets 284.  COVID-19 PCR negative.  Influenza A/B PCR negative.  BNP 457.9.  Troponin [id number]. EKG with NSR, rate 64, QTc 455, T wave inversion in 1/aVL which is unchanged after reviewing EKG from 04/29/2021 and 08/01/2020.  Chest  x-ray with peripheral hazy airspace opacities.  Cardiology was consulted, recommended conservative management with heparin drip and Plavix.  Palliative care consulted for goals of care and medical decision making.  Hospital service consulted for further evaluation and management.  Hospital course:  NSTEMI Patient presenting from ALF with panic attack associated with shortness of breath and chest pain.  Was found to have an elevated high sensitive troponin of 892, followed by 1584.  EKG with normal sinus rhythm, T wave inversion in lead I/aVL which is unchanged from previous EKGs.  Chest x-ray with peripheral hazy airspace opacities with elevated BNP of 457.9. Transthoracic echocardiogram with severe hypokinesis anterior/septal/apical walls with LVEF 30-35% with LV moderate/severe decreased function, grade 2 diastolic dysfunction, moderately dilated LA, mild MR and normal IVC.  Cardiology was consulted and followed during hospital course.  Plan conservative management with heparin drip x48 hours without any further diagnostic or invasive testing given her advanced dementia and advanced age.  Heparin drip was discontinued and cardiology recommends dual antiplatelet therapy with aspirin 81 mg p.o. daily and Plavix 75 mg p.o. daily x1 year.  Continue carvedilol, reduce dose of losartan.  Outpatient follow-up with cardiology scheduled on 05/21/2021 at 11:15 AM with Sande Rives, PA.  Acute on chronic combined systolic and diastolic congestive heart failure BNP elevated on admission with hazy peripheral opacities on chest x-ray. Transthoracic echocardiogram with severe hypokinesis anterior/septal/apical walls with LVEF 30-35% with LV moderate/severe decreased function, grade 2 diastolic dysfunction, moderately dilated LA, mild MR and normal IVC.  Patient was treated with IV furosemide which was subsequently stopped during hospitalization for mild bump in creatinine.  Continue Carvedilol 6.25 mg p.o. twice daily  and losartan 12.5 mg  p.o. daily.  Repeat BMP 1 week to assess renal function.  Outpatient follow-up with cardiology as above.  Recommend daily weights.  Panic attacks Anxiety/depression Advanced dementia with behavioral disturbance Palliative care consulted for assistance with goals of care and medical decision making. Increased Lexapro to 20 mg p.o. daily, started on BuSpar 5 mg p.o. twice daily.  Continue Xanax 0.5 mg p.o. nightly and 3 times daily as needed.  Continue Wellbutrin 200 mg p.o. daily. Refered to outpatient palliative care.  Hypothyroidism Continue levothyroxine 100 mcg p.o. daily  HLD: Not on statin outpatient  GERD: Continue PPI  Discharge Diagnoses:  Principal Problem:   NSTEMI (non-ST elevated myocardial infarction) ([place]) Active Problems:   Hypothyroidism   Hypertension   Hyperlipidemia   DNR (do not resuscitate)   Dementia with behavioral disturbance ([place])   Chronic combined systolic and diastolic heart failure [facility])    Discharge Instructions  Discharge Instructions    Diet - low sodium heart healthy   Complete by: As directed    Increase activity slowly   Complete by: As directed      Allergies as of 05/03/2021      Reactions   Vancomycin Hives   Zosyn and vanc given together; unclear which was precipitating med. Appears to have tolerated cephalosporins in the past.   Zosyn [piperacillin Sod-tazobactam So] Hives   Zosyn and vanc given together; unclear which was precipitating med. Appears to have tolerated cephalosporins in the past.   Hydrocodone Nausea Only   Morphine And Related Hives      Medication List    TAKE these medications   acetaminophen 325 MG tablet Commonly known as: TYLENOL Take 650 mg by mouth 3 (three) times daily as needed for moderate pain.   ALPRAZolam 0.5 MG tablet Commonly known as: XANAX Take 1 tablet (0.5 mg total) by mouth at bedtime. What changed: Another medication with the same name was added. Make sure you  understand how and when to take each.   ALPRAZolam 0.5 MG tablet Commonly known as: XANAX Take 1 tablet (0.5 mg total) by mouth 3 (three) times daily as needed for anxiety. What changed: You were already taking a medication with the same name, and this prescription was added. Make sure you understand how and when to take each.   aspirin 81 MG EC tablet Take 1 tablet (81 mg total) by mouth daily. Swallow whole. Start taking on: May 04, 2021   buPROPion 200 MG 12 hr tablet Commonly known as: WELLBUTRIN SR Take 200 mg by mouth daily.   busPIRone 5 MG tablet Commonly known as: BUSPAR Take 1 tablet (5 mg total) by mouth 2 (two) times daily.   carvedilol 6.25 MG tablet Commonly known as: COREG Take 1 tablet (6.25 mg total) by mouth 2 (two) times daily.   clopidogrel 75 MG tablet Commonly known as: PLAVIX Take 1 tablet (75 mg total) by mouth daily. Start taking on: May 04, 2021   docusate sodium 100 MG capsule Commonly known as: COLACE Take 100 mg by mouth 2 (two) times daily.   escitalopram 10 MG tablet Commonly known as: LEXAPRO Take 2 tablets (20 mg total) by mouth daily. Start taking on: May 04, 2021 What changed: how much to take   gabapentin 300 MG capsule Commonly known as: NEURONTIN Take 300 mg by mouth 2 (two) times daily as needed (pain).   levothyroxine 100 MCG tablet Commonly known as: SYNTHROID Take 100 mcg by mouth daily.   loperamide 2 MG capsule  Commonly known as: IMODIUM Take 2 mg by mouth as needed for diarrhea or loose stools.   losartan 25 MG tablet Commonly known as: COZAAR Take 0.5 tablets (12.5 mg total) by mouth daily. Start taking on: May 04, 2021 What changed: how much to take   ondansetron 4 MG disintegrating tablet Commonly known as: Zofran ODT Take 1 tablet (4 mg total) by mouth every 8 (eight) hours as needed for nausea or vomiting.   pantoprazole 40 MG tablet Commonly known as: PROTONIX Take 40 mg by mouth daily.   polyethylene  glycol 17 g packet Commonly known as: MIRALAX / GLYCOLAX Take 17 g by mouth every other day. As needed for constipation       Follow-up Information    Virgie Dad, MD. Schedule an appointment as soon as possible for a visit in 1 week(s).   Specialty: Internal Medicine Contact information: [place] [id number] [phone]        Darreld Mclean, PA-C. Go on 05/21/2021.   Specialties: Physician Assistant, Cardiology Why: @11 :15am for hospital follow up. Please arrive 15 minutes early  Contact information: [place] [address] [phone]              Allergies  Allergen Reactions  . Vancomycin Hives    Zosyn and vanc given together; unclear which was precipitating med. Appears to have tolerated cephalosporins in the past.  . Zosyn [Piperacillin Sod-Tazobactam So] Hives    Zosyn and vanc given together; unclear which was precipitating med. Appears to have tolerated cephalosporins in the past.  . Hydrocodone Nausea Only  . Morphine And Related Hives    Consultations:  Cardiology  Palliative care   Procedures/Studies: DG Chest 2 View  Result Date: 04/29/2021 CLINICAL DATA:  Chest pain. EXAM: CHEST - 2 VIEW COMPARISON:  Chest x-ray 07/28/2020, CT chest 07/28/2020 FINDINGS: The heart size and mediastinal contours are unchanged. Aortic arch calcifications. Biapical pleural/pulmonary scarring. No focal consolidation. Slightly increased interstitial markings persistent with no overt pulmonary edema. Likely trace bilateral pleural effusions. No pneumothorax. No acute osseous abnormality. Right upper quadrant surgical clips. IMPRESSION: 1. Likely trace bilateral pleural effusions. 2. Otherwise no acute cardiopulmonary abnormality. Electronically Signed   By: Iven Finn M.D.   On: 04/29/2021 00:28   DG Chest Portable 1 View  Result Date: 05/01/2021 CLINICAL DATA:  Dyspnea. EXAM: PORTABLE CHEST 1 VIEW COMPARISON:   Chest x-ray 04/29/2021, CT chest 04/29/2021 FINDINGS: The heart size and mediastinal contours are unchanged. Referral hazy airspace opacities. No pulmonary edema. No pleural effusion. No pneumothorax. No acute osseous abnormality. IMPRESSION: Peripheral hazy airspace opacities. COVID-19 infection not excluded. Electronically Signed   By: Iven Finn M.D.   On: 05/01/2021 03:50   ECHOCARDIOGRAM COMPLETE  Result Date: 05/01/2021    ECHOCARDIOGRAM REPORT   Patient Name:   Cindy [name] Date of Exam: 05/01/2021 Medical Rec #:  [id number]          Height:       66.0 in Accession #:    [id number]         Weight:       140.0 lb Date of Birth:  07-May-1930          BSA:          1.718 m Patient Age:    87 years           BP:           186/69 mmHg  Patient Gender: F                  HR:           67 bpm. Exam Location:  Inpatient Procedure: 2D Echo, Cardiac Doppler and Color Doppler Indications:    CHF-Acute Systolic [id number] / [id number]  History:        Patient has prior history of Echocardiogram examinations, most                 recent 12/26/2020. Acute MI.  Sonographer:    Merrie Roof Referring Phys: [place]  1. Severe hypokinesis of the anterior, septal and apical walls; overall moderate to severe LV dysfunction.  2. Left ventricular ejection fraction, by estimation, is 30 to 35%. The left ventricle has moderate to severely decreased function. The left ventricle demonstrates regional wall motion abnormalities (see scoring diagram/findings for description). Left ventricular diastolic parameters are consistent with Grade II diastolic dysfunction (pseudonormalization). Elevated left atrial pressure.  3. Right ventricular systolic function is normal. The right ventricular size is normal. There is severely elevated pulmonary artery systolic pressure.  4. Left atrial size was moderately dilated.  5. The mitral valve is abnormal. Mild mitral valve regurgitation. No evidence of mitral stenosis.  6. The  aortic valve is tricuspid. Aortic valve regurgitation is trivial. Mild aortic valve sclerosis is present, with no evidence of aortic valve stenosis.  7. The inferior vena cava is normal in size with greater than 50% respiratory variability, suggesting right atrial pressure of 3 mmHg. FINDINGS  Left Ventricle: Left ventricular ejection fraction, by estimation, is 30 to 35%. The left ventricle has moderate to severely decreased function. The left ventricle demonstrates regional wall motion abnormalities. Definity contrast agent was given IV to delineate the left ventricular endocardial borders. The left ventricular internal cavity size was normal in size. There is no left ventricular hypertrophy. Left ventricular diastolic parameters are consistent with Grade II diastolic dysfunction (pseudonormalization). Elevated left atrial pressure. Right Ventricle: The right ventricular size is normal.Right ventricular systolic function is normal. There is severely elevated pulmonary artery systolic pressure. The tricuspid regurgitant velocity is 3.82 m/s, and with an assumed right atrial pressure of 3 mmHg, the estimated right ventricular systolic pressure is [id number]. Left Atrium: Left atrial size was moderately dilated. Right Atrium: Right atrial size was normal in size. Pericardium: There is no evidence of pericardial effusion. Mitral Valve: The mitral valve is abnormal. There is mild thickening of the mitral valve leaflet(s). Mild mitral valve regurgitation. No evidence of mitral valve stenosis. Tricuspid Valve: The tricuspid valve is normal in structure. Tricuspid valve regurgitation is mild . No evidence of tricuspid stenosis. Aortic Valve: The aortic valve is tricuspid. Aortic valve regurgitation is trivial. Mild aortic valve sclerosis is present, with no evidence of aortic valve stenosis. Aortic valve mean gradient measures 4.0 mmHg. Aortic valve peak gradient measures 8.2 mmHg. Pulmonic Valve: The pulmonic valve was  normal in structure. Pulmonic valve regurgitation is not visualized. No evidence of pulmonic stenosis. Aorta: The aortic root is normal in size and structure. Venous: The inferior vena cava is normal in size with greater than 50% respiratory variability, suggesting right atrial pressure of 3 mmHg. IAS/Shunts: No atrial level shunt detected by color flow Doppler. Additional Comments: Severe hypokinesis of the anterior, septal and apical walls; overall moderate to severe LV dysfunction.  LEFT VENTRICLE PLAX 2D LVIDd:         4.50 cm  Diastology LVIDs:         3.20 cm      LV e' medial:    3.89 cm/s LV PW:         0.90 cm      LV E/e' medial:  21.8 LV IVS:        0.90 cm      LV e' lateral:   6.09 cm/s LVOT diam:     1.60 cm      LV E/e' lateral: 13.9 LV SV:         43 LV SV Index:   25 LVOT Area:     2.01 cm  LV Volumes (MOD) LV vol d, MOD A2C: 151.0 ml LV vol d, MOD A4C: 120.0 ml LV vol s, MOD A2C: 97.1 ml LV vol s, MOD A4C: 69.0 ml LV SV MOD A2C:     53.9 ml LV SV MOD A4C:     120.0 ml LV SV MOD BP:      54.9 ml RIGHT VENTRICLE RV Basal diam:  2.90 cm RV S prime:     7.95 cm/s TAPSE (M-mode): 1.8 cm LEFT ATRIUM             Index       RIGHT ATRIUM          Index LA diam:        3.60 cm 2.09 cm/m  RA Area:     9.28 cm LA Vol (A2C):   83.5 ml 48.59 ml/m RA Volume:   13.50 ml 7.86 ml/m LA Vol (A4C):   61.8 ml 35.96 ml/m LA Biplane Vol: 73.0 ml 42.48 ml/m  AORTIC VALVE AV Area (Vmax):    1.35 cm AV Area (Vmean):   1.43 cm AV Area (VTI):     1.48 cm AV Vmax:           143.00 cm/s AV Vmean:          92.300 cm/s AV VTI:            0.288 m AV Peak Grad:      8.2 mmHg AV Mean Grad:      4.0 mmHg LVOT Vmax:         96.00 cm/s LVOT Vmean:        65.700 cm/s LVOT VTI:          0.212 m LVOT/AV VTI ratio: 0.74  AORTA Ao Root diam: 2.70 cm Ao Asc diam:  2.70 cm MITRAL VALVE               TRICUSPID VALVE MV Area (PHT): 5.84 cm    TR Peak grad:   58.4 mmHg MV Decel Time: 130 msec    TR Vmax:        382.00 cm/s MV E  velocity: 84.80 cm/s MV A velocity: 76.30 cm/s  SHUNTS MV E/A ratio:  1.11        Systemic VTI:  0.21 m                            Systemic Diam: 1.60 cm Kirk Ruths MD Electronically signed by Kirk Ruths MD Signature Date/Time: 05/01/2021/5:09:31 PM    Final    CT Angio Chest/Abd/Pel for Dissection W and/or Wo Contrast  Result Date: 04/29/2021 CLINICAL DATA:  87 year old female with chest pain, chest heaviness, shortness of breath since last night. Prior history of rectal cancer. EXAM: CT ANGIOGRAPHY CHEST, ABDOMEN AND PELVIS  TECHNIQUE: Initial noncontrast CT of the chest provided. Multidetector CT imaging through the chest, abdomen and pelvis was performed using the standard protocol during bolus administration of intravenous contrast. Multiplanar reconstructed images and MIPs were obtained and reviewed to evaluate the vascular anatomy. CONTRAST:  166mL OMNIPAQUE IOHEXOL 350 MG/ML SOLN COMPARISON:  CTA chest 07/28/2020. CT Abdomen and Pelvis 07/06/2017. Right hip CT 02/05/2021. FINDINGS: CTA CHEST FINDINGS Cardiovascular: Calcified coronary artery atherosclerosis. Thoracic aortic atherosclerosis, but no thoracic aortic aneurysm or dissection. Tortuous proximal great vessels appear patent. Central pulmonary arteries also well opacified. No pulmonary artery filling defect identified. Borderline to mild cardiomegaly.  No pericardial effusion. Mediastinum/Nodes: No mediastinal mass or lymphadenopathy. Lungs/Pleura: Resolved pleural effusions since August. Improved lung volumes and ventilation. Major airways are patent. Mild left upper lobe mosaic attenuation near the lingula felt related to gas trapping. Musculoskeletal: No acute osseous abnormality identified. Review of the MIP images confirms the above findings. CTA ABDOMEN AND PELVIS FINDINGS VASCULAR Negative for abdominal aortic aneurysm or dissection. Mild to moderate for age Aortoiliac calcified atherosclerosis. Major arterial branches in the abdomen  and pelvis remain patent. Review of the MIP images confirms the above findings. NON-VASCULAR Hepatobiliary: Chronically absent gallbladder.  Negative liver. Pancreas: Negative. Spleen: Negative. Adrenals/Urinary Tract: Normal adrenal glands. Nonobstructed kidneys with symmetric renal enhancement appear stable since 2018. No nephrolithiasis. Negative ureters. Incidental pelvic phleboliths. Decompressed bladder. Chronic space of Retzius nodularity is stable since 2018 and might be posttraumatic or post treatment related. Stomach/Bowel: No dilated large or small bowel. Large bowel diverticulosis most concentrated in the sigmoid. No active inflammation. Diminutive or absent appendix. Cecum partially located in the pelvis. Negative terminal ileum. Negative stomach and duodenum. No free air, free fluid. Lymphatic: No lymphadenopathy. Reproductive: Surgically absent as before. Other: No pelvic free fluid. Musculoskeletal: Comminuted, unhealed fracture of the right acetabulum with a degree of acetabular protrusio appears stable since February. Chronic appearing deformity of the right pubic symphysis is stable, new since 2018. No new osseous abnormality identified. Review of the MIP images confirms the above findings. IMPRESSION: 1. Normal aorta aside from Atherosclerosis (ICD10-I70.0). Pulmonary arteries are also well opacified and negative for PE. 2. No acute or inflammatory process identified in the chest, abdomen, or pelvis. 3. Comminuted and unhealed right acetabular fracture stable since February. Electronically Signed   By: Genevie Ann M.D.   On: 04/29/2021 07:08      Subjective: Patient seen and examined bedside, resting comfortably.  Pleasantly confused.  Daughter present.  Okay for discharge back to ALF today per cardiology with dual antiplatelet therapy and outpatient follow-up scheduled.  Patient with no other complaints or concerns at this time.  Specifically denies headache, no visual changes, no chest pain,  no palpitations, no shortness of breath, no abdominal pain, no weakness, no fatigue, no paresthesias.  No acute events overnight per nurse staff.  Discharge Exam: Vitals:   05/03/21 0501 05/03/21 0819  BP: (!) 116/93 (!) 143/60  Pulse: 60   Resp: 15 18  Temp: 98.6 F (37 C) 98 F (36.7 C)  SpO2: 96%    Vitals:   05/02/21 2027 05/03/21 0011 05/03/21 0501 05/03/21 0819  BP: (!) 116/51 128/63 (!) 116/93 (!) 143/60  Pulse: 78  60   Resp: 16 16 15 18   Temp: 98.9 F (37.2 C) 98 F (36.7 C) 98.6 F (37 C) 98 F (36.7 C)  TempSrc: Oral  Oral Oral  SpO2: 97%  96%   Weight:   62.8 kg  General: Pt is alert, awake, not in acute distress, pleasantly confused Cardiovascular: RRR, S1/S2 +, no rubs, no gallops Respiratory: CTA bilaterally, no wheezing, no rhonchi, on room air Abdominal: Soft, NT, ND, bowel sounds + Extremities: no edema, no cyanosis    The results of significant diagnostics from this hospitalization (including imaging, microbiology, ancillary and laboratory) are listed below for reference.     Microbiology: Recent Results (from the past 240 hour(s))  Resp Panel by RT-PCR (Flu A&B, Covid) Nasopharyngeal Swab     Status: None   Collection Time: 05/01/21  3:30 AM   Specimen: Nasopharyngeal Swab; Nasopharyngeal(NP) swabs in vial transport medium  Result Value Ref Range Status   SARS Coronavirus 2 by RT PCR NEGATIVE NEGATIVE Final    Comment: (NOTE) SARS-CoV-2 target nucleic acids are NOT DETECTED.  The SARS-CoV-2 RNA is generally detectable in upper respiratory specimens during the acute phase of infection. The lowest concentration of SARS-CoV-2 viral copies this assay can detect is 138 copies/mL. A negative result does not preclude SARS-Cov-2 infection and should not be used as the sole basis for treatment or other patient management decisions. A negative result may occur with  improper specimen collection/handling, submission of specimen other than  nasopharyngeal swab, presence of viral mutation(s) within the areas targeted by this assay, and inadequate number of viral copies(<138 copies/mL). A negative result must be combined with clinical observations, patient history, and epidemiological information. The expected result is Negative.  Fact Sheet for Patients:  EntrepreneurPulse.com.au  Fact Sheet for Healthcare Providers:  IncredibleEmployment.be  This test is no t yet approved or cleared by the [place] FDA and  has been authorized for detection and/or diagnosis of SARS-CoV-2 by FDA under an Emergency Use Authorization (EUA). This EUA will remain  in effect (meaning this test can be used) for the duration of the COVID-19 declaration under Section 564(b)(1) of the Act, 21 U.S.C.section 360bbb-3(b)(1), unless the authorization is terminated  or revoked sooner.       Influenza A by PCR NEGATIVE NEGATIVE Final   Influenza B by PCR NEGATIVE NEGATIVE Final    Comment: (NOTE) The Xpert Xpress SARS-CoV-2/FLU/RSV plus assay is intended as an aid in the diagnosis of influenza from Nasopharyngeal swab specimens and should not be used as a sole basis for treatment. Nasal washings and aspirates are unacceptable for Xpert Xpress SARS-CoV-2/FLU/RSV testing.  Fact Sheet for Patients: EntrepreneurPulse.com.au  Fact Sheet for Healthcare Providers: IncredibleEmployment.be  This test is not yet approved or cleared by the [place] FDA and has been authorized for detection and/or diagnosis of SARS-CoV-2 by FDA under an Emergency Use Authorization (EUA). This EUA will remain in effect (meaning this test can be used) for the duration of the COVID-19 declaration under Section 564(b)(1) of the Act, 21 U.S.C. section 360bbb-3(b)(1), unless the authorization is terminated or revoked.  Performed at [facility], [place] [address], [place], [place] [address]       Labs: BNP (last 3 results) Recent Labs    07/28/20 0648 07/28/20 1453 05/01/21 0330  BNP 597.5* 969.7* [id number]*   Basic Metabolic Panel: Recent Labs  Lab 04/29/21 0104 05/01/21 0330 05/01/21 0355 05/02/21 0300 05/03/21 0257  NA 140 132* 138 137 135  K 4.0 3.7 3.7 4.1 3.8  CL 110 104  --  104 102  CO2 19* 21*  --  23 26  GLUCOSE 117* 145*  --  118* 103*  BUN 18 16  --  19 22  CREATININE 1.26* 1.17*  --  1.16* 1.52*  CALCIUM 9.4 8.8*  --  9.3 9.0  MG  --   --   --   --  1.8   Liver Function Tests: Recent Labs  Lab 05/01/21 0330  AST 24  ALT 11  ALKPHOS 50  BILITOT 0.3  PROT 6.9  ALBUMIN 3.4*   No results for input(s): LIPASE, AMYLASE in the last 168 hours. No results for input(s): AMMONIA in the last 168 hours. CBC: Recent Labs  Lab 04/29/21 0104 05/01/21 0330 05/01/21 0355 05/02/21 0300 05/03/21 0257  WBC 8.0 7.3  --  7.3 6.3  NEUTROABS  --  5.1  --   --   --   HGB 8.3* 8.4* 9.2* 8.6* 8.2*  HCT 27.5* 27.4* 27.0* 28.0* 26.1*  MCV 101.5* 99.3  --  98.2 95.3  PLT 272 284  --  288 287   Cardiac Enzymes: No results for input(s): CKTOTAL, CKMB, CKMBINDEX, TROPONINI in the last 168 hours. BNP: Invalid input(s): POCBNP CBG: No results for input(s): GLUCAP in the last 168 hours. D-Dimer No results for input(s): DDIMER in the last 72 hours. Hgb A1c No results for input(s): HGBA1C in the last 72 hours. Lipid Profile No results for input(s): CHOL, HDL, LDLCALC, TRIG, CHOLHDL, LDLDIRECT in the last 72 hours. Thyroid function studies No results for input(s): TSH, T4TOTAL, T3FREE, THYROIDAB in the last 72 hours.  Invalid input(s): FREET3 Anemia work up No results for input(s): VITAMINB12, FOLATE, FERRITIN, TIBC, IRON, RETICCTPCT in the last 72 hours. Urinalysis    Component Value Date/Time   COLORURINE COLORLESS (A) 07/06/2017 1630   APPEARANCEUR CLEAR 07/06/2017 1630   LABSPEC 1.003 (L) 07/06/2017 1630   LABSPEC 1.020 12/02/2013 1329   PHURINE 7.0  07/06/2017 1630   GLUCOSEU NEGATIVE 07/06/2017 1630   GLUCOSEU Negative 12/02/2013 1329   HGBUR SMALL (A) 07/06/2017 1630   BILIRUBINUR NEGATIVE 07/06/2017 1630   BILIRUBINUR Negative 12/02/2013 1329   KETONESUR NEGATIVE 07/06/2017 1630   PROTEINUR NEGATIVE 07/06/2017 1630   UROBILINOGEN 0.2 12/24/2013 1938   UROBILINOGEN 0.2 12/02/2013 1329   NITRITE NEGATIVE 07/06/2017 1630   LEUKOCYTESUR NEGATIVE 07/06/2017 1630   LEUKOCYTESUR Trace 12/02/2013 1329   Sepsis Labs Invalid input(s): PROCALCITONIN,  WBC,  LACTICIDVEN Microbiology Recent Results (from the past 240 hour(s))  Resp Panel by RT-PCR (Flu A&B, Covid) Nasopharyngeal Swab     Status: None   Collection Time: 05/01/21  3:30 AM   Specimen: Nasopharyngeal Swab; Nasopharyngeal(NP) swabs in vial transport medium  Result Value Ref Range Status   SARS Coronavirus 2 by RT PCR NEGATIVE NEGATIVE Final    Comment: (NOTE) SARS-CoV-2 target nucleic acids are NOT DETECTED.  The SARS-CoV-2 RNA is generally detectable in upper respiratory specimens during the acute phase of infection. The lowest concentration of SARS-CoV-2 viral copies this assay can detect is 138 copies/mL. A negative result does not preclude SARS-Cov-2 infection and should not be used as the sole basis for treatment or other patient management decisions. A negative result may occur with  improper specimen collection/handling, submission of specimen other than nasopharyngeal swab, presence of viral mutation(s) within the areas targeted by this assay, and inadequate number of viral copies(<138 copies/mL). A negative result must be combined with clinical observations, patient history, and epidemiological information. The expected result is Negative.  Fact Sheet for Patients:  EntrepreneurPulse.com.au  Fact Sheet for Healthcare Providers:  IncredibleEmployment.be  This test is no t yet approved or cleared by the [place]  and  has been authorized  for detection and/or diagnosis of SARS-CoV-2 by FDA under an Emergency Use Authorization (EUA). This EUA will remain  in effect (meaning this test can be used) for the duration of the COVID-19 declaration under Section 564(b)(1) of the Act, 21 U.S.C.section 360bbb-3(b)(1), unless the authorization is terminated  or revoked sooner.       Influenza A by PCR NEGATIVE NEGATIVE Final   Influenza B by PCR NEGATIVE NEGATIVE Final    Comment: (NOTE) The Xpert Xpress SARS-CoV-2/FLU/RSV plus assay is intended as an aid in the diagnosis of influenza from Nasopharyngeal swab specimens and should not be used as a sole basis for treatment. Nasal washings and aspirates are unacceptable for Xpert Xpress SARS-CoV-2/FLU/RSV testing.  Fact Sheet for Patients: EntrepreneurPulse.com.au  Fact Sheet for Healthcare Providers: IncredibleEmployment.be  This test is not yet approved or cleared by the [place] FDA and has been authorized for detection and/or diagnosis of SARS-CoV-2 by FDA under an Emergency Use Authorization (EUA). This EUA will remain in effect (meaning this test can be used) for the duration of the COVID-19 declaration under Section 564(b)(1) of the Act, 21 U.S.C. section 360bbb-3(b)(1), unless the authorization is terminated or revoked.  Performed at [facility], [place] [address], [place], [place] [address]      Time coordinating discharge: Over 30 minutes  SIGNED:   Toriann Spadoni J [place] DO  [facility] 05/03/2021, 11:02 AM

## 2021-05-08 ENCOUNTER — Other Ambulatory Visit: Payer: Self-pay

## 2021-05-08 ENCOUNTER — Emergency Department (HOSPITAL_COMMUNITY)
Admission: EM | Admit: 2021-05-08 | Discharge: 2021-05-08 | Disposition: A | Discharge status: Home / Self Care | Payer: Medicare Other | Attending: Emergency Medicine | Admitting: Emergency Medicine

## 2021-05-08 ENCOUNTER — Encounter (HOSPITAL_COMMUNITY): Payer: Self-pay

## 2021-05-08 DIAGNOSIS — I5042 Chronic combined systolic (congestive) and diastolic (congestive) heart failure: Secondary | ICD-10-CM | POA: Diagnosis not present

## 2021-05-08 DIAGNOSIS — I110 Hypertensive heart disease with heart failure: Secondary | ICD-10-CM | POA: Insufficient documentation

## 2021-05-08 DIAGNOSIS — Z85828 Personal history of other malignant neoplasm of skin: Secondary | ICD-10-CM | POA: Diagnosis not present

## 2021-05-08 DIAGNOSIS — I10 Essential (primary) hypertension: Secondary | ICD-10-CM | POA: Diagnosis not present

## 2021-05-08 DIAGNOSIS — F0390 Unspecified dementia without behavioral disturbance: Secondary | ICD-10-CM | POA: Insufficient documentation

## 2021-05-08 DIAGNOSIS — R58 Hemorrhage, not elsewhere classified: Secondary | ICD-10-CM | POA: Diagnosis not present

## 2021-05-08 DIAGNOSIS — Z85038 Personal history of other malignant neoplasm of large intestine: Secondary | ICD-10-CM | POA: Insufficient documentation

## 2021-05-08 DIAGNOSIS — R040 Epistaxis: Secondary | ICD-10-CM | POA: Insufficient documentation

## 2021-05-08 DIAGNOSIS — E039 Hypothyroidism, unspecified: Secondary | ICD-10-CM | POA: Diagnosis not present

## 2021-05-08 DIAGNOSIS — Z7982 Long term (current) use of aspirin: Secondary | ICD-10-CM | POA: Insufficient documentation

## 2021-05-08 DIAGNOSIS — D649 Anemia, unspecified: Secondary | ICD-10-CM | POA: Insufficient documentation

## 2021-05-08 DIAGNOSIS — Z79899 Other long term (current) drug therapy: Secondary | ICD-10-CM | POA: Diagnosis not present

## 2021-05-08 DIAGNOSIS — Z7902 Long term (current) use of antithrombotics/antiplatelets: Secondary | ICD-10-CM | POA: Insufficient documentation

## 2021-05-08 LAB — CBC WITH DIFFERENTIAL/PLATELET
Abs Immature Granulocytes: 0.02 10*3/uL (ref 0.00–0.07)
Basophils Absolute: 0.1 10*3/uL (ref 0.0–0.1)
Basophils Relative: 1 %
Eosinophils Absolute: 0.3 10*3/uL (ref 0.0–0.5)
Eosinophils Relative: 5 %
HCT: 25.5 % — ABNORMAL LOW (ref 36.0–46.0)
Hemoglobin: 8 g/dL — ABNORMAL LOW (ref 12.0–15.0)
Immature Granulocytes: 0 %
Lymphocytes Relative: 40 %
Lymphs Abs: 2.3 10*3/uL (ref 0.7–4.0)
MCH: 30.4 pg (ref 26.0–34.0)
MCHC: 31.4 g/dL (ref 30.0–36.0)
MCV: 97 fL (ref 80.0–100.0)
Monocytes Absolute: 0.5 10*3/uL (ref 0.1–1.0)
Monocytes Relative: 9 %
Neutro Abs: 2.6 10*3/uL (ref 1.7–7.7)
Neutrophils Relative %: 45 %
Platelets: 318 10*3/uL (ref 150–400)
RBC: 2.63 MIL/uL — ABNORMAL LOW (ref 3.87–5.11)
RDW: 13.8 % (ref 11.5–15.5)
WBC: 5.7 10*3/uL (ref 4.0–10.5)
nRBC: 0 % (ref 0.0–0.2)

## 2021-05-08 NOTE — ED Provider Notes (Signed)
[place] DEPT Provider Note   CSN: [id number] Arrival date & time: 05/08/21  1029     History Chief Complaint  Patient presents with  . Epistaxis    Cindy [name] is a 87 y.o. female.  HPI   Patient presented to the ED for evaluation of nosebleed.  Patient does have history of prior issues with nosebleeds.  She has seen an  ENT doctor this past month for the same issue.  She had her nose cauterized at that time.  Patient started having bleeding again this morning.  They were not able to get the bleeding to stop so she came to the ED.  Patient denies any lightheadedness.  No nausea vomiting.  She did not take her Plavix this morning  Past Medical History:  Diagnosis Date  . Arthritis   . Cancer ([place])    skin  . Colon cancer ([place]) 08/31/13   invasive squamous cell  . Deaf, left   . Depression   . GERD (gastroesophageal reflux disease)   . History of radiation therapy 10/10/13-11/23/13   anal canal 54GY/  . Hyperlipemia   . Hypertension   . Hypothyroidism   . PONV (postoperative nausea and vomiting)   . Skin cancer    Shingles 2012 Bad case    Patient Active Problem List   Diagnosis Date Noted  . NSTEMI (non-ST elevated myocardial infarction) ([place]) 05/01/2021  . Chronic combined systolic and diastolic heart failure ([place]) 05/01/2021  . CHF (congestive heart failure) ([place]) 08/14/2020  . DVT (deep venous thrombosis) ([place]) 07/29/2020  . Pulmonary embolus ([place]) 07/28/2020  . Anemia 07/28/2020  . Pulmonary embolism ([place]) 07/28/2020  . Acute respiratory failure with hypoxia ([place]) 07/28/2020  . Dementia with behavioral disturbance ([place])   . DNR (do not resuscitate)   . Palliative care by specialist   . Adult failure to thrive   . Acetabular fracture ([place]) 07/07/2020  . Sepsis ([place]) 03/09/2017  . Nausea vomiting and diarrhea 03/09/2017  . Elevated troponin 03/09/2017  . Chest pain 12/25/2013  . Dyspnea 12/25/2013  . Restless leg  syndrome 12/25/2013  . Mucositis 10/27/2013  . Hypophosphatemia 10/27/2013  . Protein-calorie malnutrition, severe ([place]) 10/25/2013  . Thrombocytopenia ([place]) 10/25/2013  . Hypomagnesemia 10/24/2013  . Febrile neutropenia ([place]) 10/23/2013  . Anal cancer ([place]) 09/09/2013  . Colon cancer ([place]) 08/31/2013  . Colitis, acute 03/17/2013  . Gastroparesis 10/22/2012  . Hand pain, left 10/22/2012  . Anxiety 10/18/2012  . Hyperventilation syndrome 10/18/2012  . Headache(784.0) 10/18/2012  . Neck pain on right side 10/18/2012  . Hypertension 10/18/2012  . GERD (gastroesophageal reflux disease) 10/18/2012  . Hyperlipidemia 10/18/2012  . Insomnia 10/18/2012  . Hearing loss sensory, bilateral 10/18/2012  . 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 Weakness generalized 10/17/2012  . Hyponatremia 10/17/2012  . Hypokalemia 10/17/2012  . Hypothyroidism 10/17/2012  . Fatigue 10/17/2012    Past Surgical History:  Procedure Laterality Date  . ABDOMINAL HYSTERECTOMY    . APPENDECTOMY    . CESAREAN SECTION     2  . CHOLECYSTECTOMY    . COLONOSCOPY  08/31/13   invasive squamous cell colon  . ERCP    . ESOPHAGOGASTRODUODENOSCOPY  10/20/2012   Procedure: ESOPHAGOGASTRODUODENOSCOPY (EGD);  Surgeon: Arta Silence, MD;  Location: [facility];  Service: Endoscopy;  Laterality: Left;  . EYE SURGERY     cataracts  . KNEE ARTHROSCOPY  05/11/2012   Procedure: ARTHROSCOPY KNEE;  Surgeon: Hessie Dibble, MD;  Location:  [facility];  Service: Orthopedics;  Laterality: Right;  left knee medial menisectomy and chondroplasty  . THYROIDECTOMY, PARTIAL    . TONSILLECTOMY       OB History   No obstetric history on file.     Family History  Problem Relation Age of Onset  . Kidney disease Mother   . Heart attack Father   . Heart disease Father   . Cancer Sister        breast  . Cancer Sister        ovarian  . Cancer Sister        melanoma  . Cancer Daughter        breast     Social History   Tobacco Use  . Smoking status: Never Smoker  . Smokeless tobacco: Never Used  Substance Use Topics  . Alcohol use: No  . Drug use: No    Home Medications Prior to Admission medications   Medication Sig Start Date End Date Taking? Authorizing Provider  acetaminophen (TYLENOL) 325 MG tablet Take 650 mg by mouth 3 (three) times daily.   Yes [provider]  ALPRAZolam (XANAX) 0.5 MG tablet Take 1 tablet (0.5 mg total) by mouth at bedtime. Patient taking differently: Take 0.5 mg by mouth See admin instructions. 0.5mg  daily at bedtime And 0.5mg  three times daily as needed for anxiety 05/03/21 06/02/21 Yes [place] Donnamarie Poag, DO  aspirin EC 81 MG EC tablet Take 1 tablet (81 mg total) by mouth daily. Swallow whole. 05/04/21  Yes [place] Eric J, DO  buPROPion (WELLBUTRIN SR) 200 MG 12 hr tablet Take 200 mg by mouth daily.    Yes [provider]  busPIRone (BUSPAR) 5 MG tablet Take 1 tablet (5 mg total) by mouth 2 (two) times daily. 05/03/21 06/02/21 Yes [place] Eric J, DO  carvedilol (COREG) 6.25 MG tablet Take 1 tablet (6.25 mg total) by mouth 2 (two) times daily. 09/05/20  Yes [place], Peter M, MD  clopidogrel (PLAVIX) 75 MG tablet Take 1 tablet (75 mg total) by mouth daily. 05/04/21 05/04/22 Yes [place] Eric J, DO  docusate sodium (COLACE) 100 MG capsule Take 100 mg by mouth 2 (two) times daily.   Yes [provider]  escitalopram (LEXAPRO) 20 MG tablet Take 20 mg by mouth daily.   Yes [provider]  gabapentin (NEURONTIN) 300 MG capsule Take 300 mg by mouth 2 (two) times daily as needed (pain).   Yes [provider]  levothyroxine (SYNTHROID) 100 MCG tablet Take 100 mcg by mouth daily.   Yes [provider]  loperamide (IMODIUM) 2 MG capsule Take 2 mg by mouth 4 (four) times daily as needed for diarrhea or loose stools.   Yes [provider]  losartan (COZAAR) 25 MG tablet Take 0.5 tablets (12.5 mg total) by mouth  daily. 05/04/21 08/02/21 Yes [place] Eric J, DO  ondansetron (ZOFRAN ODT) 4 MG disintegrating tablet Take 1 tablet (4 mg total) by mouth every 8 (eight) hours as needed for nausea or vomiting. 07/06/17  Yes Sherwood Gambler, MD  pantoprazole (PROTONIX) 40 MG tablet Take 40 mg by mouth daily.   Yes [provider]  polyethylene glycol (MIRALAX / GLYCOLAX) 17 g packet Take 17 g by mouth See admin instructions. 17g every other day as needed for constipation   Yes [provider]  ALPRAZolam (XANAX) 0.5 MG tablet Take 1 tablet (0.5 mg total) by mouth 3 (three) times daily as needed for anxiety. Patient not  taking: Reported on 05/08/2021 05/03/21 06/02/21  [place] Donnamarie Poag, DO  escitalopram (LEXAPRO) 10 MG tablet Take 2 tablets (20 mg total) by mouth daily. Patient not taking: Reported on 05/08/2021 05/04/21 06/03/21  [place] Eric J, DO    Allergies    Vancomycin, Zosyn [piperacillin sod-tazobactam so], Hydrocodone, and Morphine and related  Review of Systems   Review of Systems  All other systems reviewed and are negative.   Physical Exam Updated Vital Signs BP (!) 158/62   Pulse (!) 58   Temp 98 F (36.7 C) (Oral)   Resp 20   Ht 1.676 m (5\' 6" )   Wt 62 kg   SpO2 99%   BMI 22.06 kg/m   Physical Exam Vitals and nursing note reviewed.  Constitutional:      General: She is not in acute distress.    Appearance: She is well-developed.  HENT:     Head: Normocephalic and atraumatic.     Comments: Small blood clot noted in the left anterior nasal septum, no active bleeding, no blood noted on the right nares    Right Ear: External ear normal.     Left Ear: External ear normal.  Eyes:     General: No scleral icterus.       Right eye: No discharge.        Left eye: No discharge.     Conjunctiva/sclera: Conjunctivae normal.  Neck:     Trachea: No tracheal deviation.  Cardiovascular:     Rate and Rhythm: Normal rate and regular rhythm.  Pulmonary:     Effort: Pulmonary  effort is normal. No respiratory distress.     Breath sounds: Normal breath sounds. No stridor. No wheezing or rales.  Abdominal:     General: Bowel sounds are normal. There is no distension.     Palpations: Abdomen is soft.     Tenderness: There is no abdominal tenderness. There is no guarding or rebound.  Musculoskeletal:        General: No tenderness.     Cervical back: Neck supple.  Skin:    General: Skin is warm and dry.     Findings: No rash.  Neurological:     Mental Status: She is alert.     Cranial Nerves: No cranial nerve deficit (no facial droop, extraocular movements intact, no slurred speech).     Sensory: No sensory deficit.     Motor: No abnormal muscle tone or seizure activity.     Coordination: Coordination normal.     ED Results / Procedures / Treatments   Labs (all labs ordered are listed, but only abnormal results are displayed) Labs Reviewed  CBC WITH DIFFERENTIAL/PLATELET - Abnormal; Notable for the following components:      Result Value   RBC 2.63 (*)    Hemoglobin 8.0 (*)    HCT 25.5 (*)    All other components within normal limits    EKG None  Radiology No results found.  Procedures Procedures   Medications Ordered in ED Medications - No data to display  ED Course  I have reviewed the triage vital signs and the nursing notes.  Pertinent labs & imaging results that were available during my care of the patient were reviewed by me and considered in my medical decision making (see chart for details).  Clinical Course as of 05/08/21 1348  Wed May 08, 2021  1242 Globin is decreased at 8.0 but that is similar to previous values [JK]  1242 6 days  ago was 8.6 [JK]    Clinical Course User Index [JK] Dorie Rank, MD   MDM Rules/Calculators/A&P                          Patient presented to the ED for evaluation of a nosebleed.  Patient has had recurrent issues with nosebleeds.  In the ED the bleeding had stopped.  Her blood count is stable  although she does have anemia.  Patient was monitored for several hours.  There was no recurrence of the bleeding.  Family ended up calling her ENT doctor and they scheduled an outpatient appointment this afternoon.  Since she is not having any active bleeding right now I do think it is reasonable her to follow-up as an outpatient with the ENT doctor.  Do recommend outpatient follow-up with her primary doctor regarding her anemia.  I will have her hold her aspirin and Plavix Final Clinical Impression(s) / ED Diagnoses Final diagnoses:  Epistaxis  Anemia, unspecified type    Rx / DC Orders ED Discharge Orders    None       Dorie Rank, MD 05/08/21 1349

## 2021-05-08 NOTE — Discharge Instructions (Signed)
Your blood test show that your hemoglobin is stable compared to a couple days ago.  It was 8.22 days ago and today it is 8.0.  We will continue to follow-up with your doctor as that is lower than normal.  Follow-up with the ENT doctor today as planned.  I would recommend holding your Plavix and aspirin for the next several days until the bleeding has completely stopped

## 2021-05-08 NOTE — ED Triage Notes (Signed)
Pt resides at [facility]. Per ems nose bleed x3 hrs. Night gown and brief saturated with blood on ems arrival. Ems admin 2 spray of afrin to left Nare with no relief. Plavix held this am.

## 2021-05-09 DIAGNOSIS — Z7902 Long term (current) use of antithrombotics/antiplatelets: Secondary | ICD-10-CM | POA: Diagnosis not present

## 2021-05-09 DIAGNOSIS — R040 Epistaxis: Secondary | ICD-10-CM | POA: Diagnosis not present

## 2021-05-09 DIAGNOSIS — Z7982 Long term (current) use of aspirin: Secondary | ICD-10-CM | POA: Diagnosis not present

## 2021-05-13 DIAGNOSIS — F5104 Psychophysiologic insomnia: Secondary | ICD-10-CM | POA: Diagnosis not present

## 2021-05-13 DIAGNOSIS — M169 Osteoarthritis of hip, unspecified: Secondary | ICD-10-CM | POA: Diagnosis not present

## 2021-05-13 DIAGNOSIS — I10 Essential (primary) hypertension: Secondary | ICD-10-CM | POA: Diagnosis not present

## 2021-05-13 DIAGNOSIS — F410 Panic disorder [episodic paroxysmal anxiety] without agoraphobia: Secondary | ICD-10-CM | POA: Diagnosis not present

## 2021-05-14 DIAGNOSIS — E785 Hyperlipidemia, unspecified: Secondary | ICD-10-CM | POA: Diagnosis not present

## 2021-05-21 ENCOUNTER — Ambulatory Visit: Payer: Medicare Other | Admitting: Student

## 2021-05-23 ENCOUNTER — Non-Acute Institutional Stay: Payer: Medicare Other | Admitting: Nurse Practitioner

## 2021-05-23 ENCOUNTER — Other Ambulatory Visit: Payer: Self-pay

## 2021-05-23 DIAGNOSIS — F0390 Unspecified dementia without behavioral disturbance: Secondary | ICD-10-CM

## 2021-05-23 DIAGNOSIS — F410 Panic disorder [episodic paroxysmal anxiety] without agoraphobia: Secondary | ICD-10-CM | POA: Diagnosis not present

## 2021-05-23 DIAGNOSIS — R040 Epistaxis: Secondary | ICD-10-CM

## 2021-05-23 NOTE — Progress Notes (Signed)
[facility] Consult Note Telephone: [phone]  Fax: [phone]    Date of encounter: 05/23/21 PATIENT NAME: Cindy [name] [facility] [place] [address]   [phone] (home)  DOB: Sep 08, 1930 MRN: [id number]  PRIMARY CARE PROVIDER:    Clovia Cuff, MD,  [address] [place] [address] [phone]  REFERRING PROVIDER:   Clovia Cuff, MD [address] [place],   [address] [phone]  RESPONSIBLE PARTY:    Contact Information    Name Relation Home Work Cindy [name] Daughter   [phone]   Cindy [name] Daughter [phone]  [phone]   Cindy [name] Daughter [phone]  [phone]     I met face to face with patient in facility.  Palliative Care was asked to follow this patient by consultation request of  Cindy Cuff, MD to address advance care planning and complex medical decision making. This is the initial visit.                                   ASSESSMENT AND PLAN / RECOMMENDATIONS:   Advance Care Planning/Goals of Care: Goals include to maximize quality of life and symptom management.  Visit consisted of building trust and discussions on Palliative care medicine as specialized medical care for people living with serious illness, aimed at facilitating improved quality of life through symptoms relief, assisting with advance care planning and establishing goals of care. Patient expressed appreciation for education provided on Palliative care and how it differs from Hospice service. Our advance care planning conversation today included a discussion about:     The value and importance of advance care planning   Experiences with loved ones who have been seriously ill or have died   Exploration of personal, cultural or spiritual beliefs that might influence medical decisions   Exploration of goals of care in the event of a sudden injury  or illness   Review and updating or creation of an  advance directive document .  CODE STATUS: DNR Goal of Care: Patient goal of care is function. Patient verbalized desire to be able to walk independently again. She report she last walked about a year ago and now working with physical therapy to achieve the goal. Directives: Patient's code status is DNR, she reiterated desire for no resuscitation in the event of cardiac or respiratory arrest. She has a DNR form on file in the facility. Electronic MOST form present on [facility] Epic EMR. Reviewed MOST form elections with patient and no changes made to elections today. Details of MOST form include limited additional intervention, antibiotics if indicated, IV fluids if indicated, no feeding tube.  Provided general support and encouragement. Questions and concerns were addressed. Patient was encouraged to call with questions and/or concerns. My business card was provided.  I spent  25 minutes providing this consultation. More than 50% of the time in this consultation was spent in counseling and care coordination. ----------------------------------------------------------------------------  Symptom Management/Plan: Panic attack: Encouraged to request for prescribed PRN Xanax when attack occurs. Continue Wellbutrin 200mg  and Lexapro for anxiety.  Epitaxis: last episode was last night, second time in the last 2 weeks. Report using Afrin nasal spray with relief. Had ED visit due to nose bleed on 05/08/2021, report history of cauterization by ENT. Patient on Aspirin and Plavix, may consider reviewing ongoing need if nose bleed persists, considering risk vs benefit.  Chronic right hip pain: left hip pain from injury from mechanical fall sustained July 2021. Patient deemed not a good surgical candidate. Denied pain today, report pain is aggravated by movement. Patient currently ambulates with a wheelchair, receives physical therapy twice a week. Continue  supportive care. Continue Tylenol $RemoveBeforeDE'650mg'DvxyvnYbLKgesXt$  by mouth three times daily. Maintain safety, fall precautions per facility protocol.  Dementia with behavioral disturbance: condition is stable, no report of uncontrolled behavior concern. Patient awake, alert and coherent. Able to complete most of her ADLs independently. Report good family support, her children visits daily. Continue supportive care. Encouraged participation in facility activities.  Follow up Palliative Care Visit: Palliative care will continue to follow for complex medical decision making, advance care planning, and clarification of goals. Return in about 6-8 weeks or prn.  PPS: 50%  HOSPICE ELIGIBILITY/DIAGNOSIS: TBD  Chief Complaint: frequent panic attacks  History obtained from review of Epic EMR, discussion with with facility staff and interview with Cindy [name].  HISTORY OF PRESENT ILLNESS:  Cindy [name] is a 87 y.o. year old female  with multiple medical problems including dementia with behavioral disturbance (FAST 4), HTN, HLD, hypothyroidism, chronic combined systolic and diastolic heart failure (EF 30-35% 05/01/2021) , hx of DVT/PE (on Aspirin and Plavix), Hx of NSTEMI. Patient report ongoing episodes of panic attacks associated with anxiety. Report condition occurs about every 2 weeks, mostly at bedtime, when lying down to sleep. Last episode was last night, report condition is relieved by deep breathing and sometimes calling her daughter for emotional support. Patient report not taking prescribed Xanax. She voiced no reason for the panic attacks, denied being depressed, denied being anxious today. Denied fever or chills. Report feeling well overall.  Reviewed labs from 05/03/2021 Na 135, K 3.8, Cr 1.52, GFR 33, WBC 6.3, Hgb 8.2, Hct 26.1 Reviewed CT hip from 02/05/2021 Reviewed CT angio chest/abd from 04/29/2021  I reviewed available labs, medications, imaging, studies and related documents from the EMR.  Records reviewed  and summarized above.   ROS General: NAD EYES: denies acute vision changes ENMT: denies dysphagia Cardiovascular: denies chest pain, denies DOE Pulmonary: denies cough, denies increased SOB Abdomen: endorses good appetite, denies constipation, endorses continence of bowel GU: denies dysuria, endorses continence of urine MSK:  endorsed weakness, no falls reported Skin: denies rashes or wounds Neurological: denies pain, denies insomnia Psych: Endorses positive mood today Heme/lymph/immuno: denies bruises, report occasional nose bleeding  Physical Exam: General: frail appearing, cooperative, sitting in her wheelchair in NAD  EYES: anicteric sclera, no discharge  ENMT: intact hearing, oral mucous membranes moist CV:  RRR, no LE edema Pulmonary: LCTA, no increased work of breathing, no cough, room air Abdomen: no ascites GU: deferred MSK: sarcopenia, moves all extremities, ambulatory with wheelchair Skin: warm and dry, no rashes or wounds on visible skin Neuro:  generalized weakness, no cognitive impairment Psych: non-anxious affect, A and O x 4 Hem/lymph/immuno: no widespread bruising  CURRENT PROBLEM LIST:  Patient Active Problem List   Diagnosis Date Noted  . NSTEMI (non-ST elevated myocardial infarction) ([place]) 05/01/2021  . Chronic combined systolic and diastolic heart failure ([place]) 05/01/2021  . CHF (congestive heart failure) ([place]) 08/14/2020  . DVT (deep venous thrombosis) ([place]) 07/29/2020  . Pulmonary embolus ([place]) 07/28/2020  . Anemia 07/28/2020  . Pulmonary embolism ([place]) 07/28/2020  . Acute respiratory failure with hypoxia ([place]) 07/28/2020  . Dementia with behavioral disturbance ([place])   . DNR (do not resuscitate)   . Palliative care by specialist   .  Adult failure to thrive   . Acetabular fracture ([place]) 07/07/2020  . Sepsis ([place]) 03/09/2017  . Nausea vomiting and diarrhea 03/09/2017  . Elevated troponin 03/09/2017  . Chest pain 12/25/2013  . Dyspnea 12/25/2013   . Restless leg syndrome 12/25/2013  . Mucositis 10/27/2013  . Hypophosphatemia 10/27/2013  . Protein-calorie malnutrition, severe ([place]) 10/25/2013  . Thrombocytopenia ([place]) 10/25/2013  . Hypomagnesemia 10/24/2013  . Febrile neutropenia ([place]) 10/23/2013  . Anal cancer ([place]) 09/09/2013  . Colon cancer ([place]) 08/31/2013  . Colitis, acute 03/17/2013  . Gastroparesis 10/22/2012  . Hand pain, left 10/22/2012  . Anxiety 10/18/2012  . Hyperventilation syndrome 10/18/2012  . Headache(784.0) 10/18/2012  . Neck pain on right side 10/18/2012  . Hypertension 10/18/2012  . GERD (gastroesophageal reflux disease) 10/18/2012  . Hyperlipidemia 10/18/2012  . Insomnia 10/18/2012  . Hearing loss sensory, bilateral 10/18/2012  . 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 Weakness generalized 10/17/2012  . Hyponatremia 10/17/2012  . Hypokalemia 10/17/2012  . Hypothyroidism 10/17/2012  . Fatigue 10/17/2012   PAST MEDICAL HISTORY:  Active Ambulatory Problems    Diagnosis Date Noted  . Weakness generalized 10/17/2012  . Hyponatremia 10/17/2012  . Hypokalemia 10/17/2012  . Hypothyroidism 10/17/2012  . Fatigue 10/17/2012  . Anxiety 10/18/2012  . Hyperventilation syndrome 10/18/2012  . Headache(784.0) 10/18/2012  . Neck pain on right side 10/18/2012  . Hypertension 10/18/2012  . GERD (gastroesophageal reflux disease) 10/18/2012  . Hyperlipidemia 10/18/2012  . Insomnia 10/18/2012  . Hearing loss sensory, bilateral 10/18/2012  . 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 Gastroparesis 10/22/2012  . Hand pain, left 10/22/2012  . Colitis, acute 03/17/2013  . Anal cancer ([place]) 09/09/2013  . Colon cancer ([place]) 08/31/2013  . Febrile neutropenia ([place]) 10/23/2013  . Hypomagnesemia 10/24/2013  . Protein-calorie malnutrition, severe ([place]) 10/25/2013  . Thrombocytopenia ([place]) 10/25/2013  . Mucositis 10/27/2013  . Hypophosphatemia 10/27/2013  . Chest pain 12/25/2013  . Dyspnea 12/25/2013   . Restless leg syndrome 12/25/2013  . Sepsis ([place]) 03/09/2017  . Nausea vomiting and diarrhea 03/09/2017  . Elevated troponin 03/09/2017  . Acetabular fracture ([place]) 07/07/2020  . DNR (do not resuscitate)   . Palliative care by specialist   . Adult failure to thrive   . Dementia with behavioral disturbance ([place])   . Pulmonary embolus ([place]) 07/28/2020  . Anemia 07/28/2020  . Pulmonary embolism ([place]) 07/28/2020  . Acute respiratory failure with hypoxia ([place]) 07/28/2020  . DVT (deep venous thrombosis) ([place]) 07/29/2020  . CHF (congestive heart failure) ([place]) 08/14/2020  . NSTEMI (non-ST elevated myocardial infarction) ([place]) 05/01/2021  . Chronic combined systolic and diastolic heart failure ([place]) 05/01/2021   Resolved Ambulatory Problems    Diagnosis Date Noted  . No Resolved Ambulatory Problems   Past Medical History:  Diagnosis Date  . Arthritis   . Cancer ([place])   . Deaf, left   . Depression   . History of radiation therapy 10/10/13-11/23/13  . Hyperlipemia   . PONV (postoperative nausea and vomiting)   . Skin cancer    SOCIAL HX:  Social History   Tobacco Use  . Smoking status: Never Smoker  . Smokeless tobacco: Never Used  Substance Use Topics  . Alcohol use: No   FAMILY HX:  Family History  Problem Relation Age of Onset  . Kidney disease Mother   . Heart attack Father   . Heart disease Father   . Cancer Sister        breast  . Cancer Sister  ovarian  . Cancer Sister        melanoma  . Cancer Daughter        breast     ALLERGIES:  Allergies  Allergen Reactions  . Vancomycin Hives    Zosyn and vanc given together; unclear which was precipitating med. Appears to have tolerated cephalosporins in the past.  . Zosyn [Piperacillin Sod-Tazobactam So] Hives    Zosyn and vanc given together; unclear which was precipitating med. Appears to have tolerated cephalosporins in the past.  . Hydrocodone Nausea Only  . Morphine And Related Hives     PERTINENT  MEDICATIONS:  Outpatient Encounter Medications as of 05/23/2021  Medication Sig  . acetaminophen (TYLENOL) 325 MG tablet Take 650 mg by mouth 3 (three) times daily.  Marland Kitchen ALPRAZolam (XANAX) 0.5 MG tablet Take 1 tablet (0.5 mg total) by mouth at bedtime. (Patient taking differently: Take 0.5 mg by mouth See admin instructions. 0.5mg  daily at bedtime And 0.5mg  three times daily as needed for anxiety)  . ALPRAZolam (XANAX) 0.5 MG tablet Take 1 tablet (0.5 mg total) by mouth 3 (three) times daily as needed for anxiety. (Patient not taking: Reported on 05/08/2021)  . aspirin EC 81 MG EC tablet Take 1 tablet (81 mg total) by mouth daily. Swallow whole.  Marland Kitchen buPROPion (WELLBUTRIN SR) 200 MG 12 hr tablet Take 200 mg by mouth daily.   . busPIRone (BUSPAR) 5 MG tablet Take 1 tablet (5 mg total) by mouth 2 (two) times daily.  . carvedilol (COREG) 6.25 MG tablet Take 1 tablet (6.25 mg total) by mouth 2 (two) times daily.  . clopidogrel (PLAVIX) 75 MG tablet Take 1 tablet (75 mg total) by mouth daily.  Marland Kitchen docusate sodium (COLACE) 100 MG capsule Take 100 mg by mouth 2 (two) times daily.  Marland Kitchen escitalopram (LEXAPRO) 10 MG tablet Take 2 tablets (20 mg total) by mouth daily. (Patient not taking: Reported on 05/08/2021)  . escitalopram (LEXAPRO) 20 MG tablet Take 20 mg by mouth daily.  Marland Kitchen gabapentin (NEURONTIN) 300 MG capsule Take 300 mg by mouth 2 (two) times daily as needed (pain).  Marland Kitchen levothyroxine (SYNTHROID) 100 MCG tablet Take 100 mcg by mouth daily.  Marland Kitchen loperamide (IMODIUM) 2 MG capsule Take 2 mg by mouth 4 (four) times daily as needed for diarrhea or loose stools.  Marland Kitchen losartan (COZAAR) 25 MG tablet Take 0.5 tablets (12.5 mg total) by mouth daily.  . ondansetron (ZOFRAN ODT) 4 MG disintegrating tablet Take 1 tablet (4 mg total) by mouth every 8 (eight) hours as needed for nausea or vomiting.  . pantoprazole (PROTONIX) 40 MG tablet Take 40 mg by mouth daily.  . polyethylene glycol (MIRALAX / GLYCOLAX) 17 g packet Take 17 g  by mouth See admin instructions. 17g every other day as needed for constipation   No facility-administered encounter medications on file as of 05/23/2021.   Thank you for the opportunity to participate in the care of Ms. Divito.  The palliative care team will continue to follow. Please call our office at [phone] if we can be of additional assistance.   Jari Favre, DNP, AGPCNP-BC  COVID-19 PATIENT SCREENING TOOL Asked and negative response unless otherwise noted:   Have you had symptoms of covid, tested positive or been in contact with someone with symptoms/positive test in the past 5-10 days?

## 2021-05-25 ENCOUNTER — Emergency Department (HOSPITAL_COMMUNITY)
Admission: EM | Admit: 2021-05-25 | Discharge: 2021-05-26 | Discharge status: Against Medical Advice | Payer: TRICARE For Life (TFL)

## 2021-05-25 DIAGNOSIS — I499 Cardiac arrhythmia, unspecified: Secondary | ICD-10-CM | POA: Diagnosis not present

## 2021-05-25 DIAGNOSIS — R059 Cough, unspecified: Secondary | ICD-10-CM | POA: Diagnosis not present

## 2021-05-25 DIAGNOSIS — R079 Chest pain, unspecified: Secondary | ICD-10-CM | POA: Diagnosis not present

## 2021-05-25 DIAGNOSIS — R0789 Other chest pain: Secondary | ICD-10-CM | POA: Diagnosis not present

## 2021-05-25 DIAGNOSIS — R0902 Hypoxemia: Secondary | ICD-10-CM | POA: Diagnosis not present

## 2021-05-25 NOTE — ED Notes (Signed)
Patient daughter states she thinks her mom had anxiety attack and doesn't need treatment anymore. EMS IV removed prior to patient leaving

## 2021-05-29 ENCOUNTER — Encounter (HOSPITAL_COMMUNITY): Payer: Self-pay

## 2021-05-29 ENCOUNTER — Other Ambulatory Visit: Payer: Self-pay

## 2021-05-29 ENCOUNTER — Emergency Department (HOSPITAL_COMMUNITY)
Admission: EM | Admit: 2021-05-29 | Discharge: 2021-05-29 | Disposition: A | Discharge status: Home / Self Care | Payer: Medicare Other | Attending: Emergency Medicine | Admitting: Emergency Medicine

## 2021-05-29 DIAGNOSIS — Z85038 Personal history of other malignant neoplasm of large intestine: Secondary | ICD-10-CM | POA: Diagnosis not present

## 2021-05-29 DIAGNOSIS — I10 Essential (primary) hypertension: Secondary | ICD-10-CM | POA: Diagnosis not present

## 2021-05-29 DIAGNOSIS — R040 Epistaxis: Secondary | ICD-10-CM | POA: Diagnosis not present

## 2021-05-29 DIAGNOSIS — Z7982 Long term (current) use of aspirin: Secondary | ICD-10-CM | POA: Diagnosis not present

## 2021-05-29 DIAGNOSIS — Z79899 Other long term (current) drug therapy: Secondary | ICD-10-CM | POA: Insufficient documentation

## 2021-05-29 DIAGNOSIS — R0902 Hypoxemia: Secondary | ICD-10-CM | POA: Diagnosis not present

## 2021-05-29 DIAGNOSIS — E039 Hypothyroidism, unspecified: Secondary | ICD-10-CM | POA: Diagnosis not present

## 2021-05-29 MED ORDER — IBUPROFEN 200 MG PO TABS
400.0000 mg | ORAL_TABLET | Freq: Once | ORAL | Status: AC
  Administered 2021-05-29: 400 mg via ORAL
  Filled 2021-05-29: qty 2

## 2021-05-29 NOTE — ED Triage Notes (Signed)
Pt complains of a nosebleed all day, EMS went out once and it had stopped but were called back again, two weeks ago she was seen here for the same and the bleeding was stopped but the next day her primary doctor did cauterize

## 2021-05-29 NOTE — ED Notes (Signed)
Received pt from [facility]. As per MD [place], observe patient for approx 30 minutes before discharge to ensure nosebleed does not start again.

## 2021-05-29 NOTE — ED Provider Notes (Signed)
[facility] DEPT Provider Note: Georgena Spurling, MD, FACEP  CSN: [id number] MRN: [id number] ARRIVAL: 05/29/21 at [facility]: [id number]   CHIEF COMPLAINT  Nosebleed   HISTORY OF PRESENT ILLNESS  05/29/21 6:41 AM Cindy [name] is a 87 y.o. female with recurrent nosebleeds.  She has had a nosebleed throughout the day since yesterday.  She has been using Afrin with transient relief but it often recurs.  EMS was called out yesterday but by the time they arrived the bleeding had stopped.  The bleeding recurred this morning and EMS was called again.  Her last use of Afrin was about 4 AM.  The bleeding was moderate and from the left nostril which is the nostril that keeps bleeding.    At this time the bleeding has stopped.  Nevertheless, she is requesting that the bleeding site be cauterized as she has had best relief with cauterization in the past.  She is not having any pain in her nose but does have a headache.  Past Medical History:  Diagnosis Date  . Arthritis   . Cancer ([place])    skin  . Colon cancer ([place]) 08/31/13   invasive squamous cell  . Deaf, left   . Depression   . GERD (gastroesophageal reflux disease)   . History of radiation therapy 10/10/13-11/23/13   anal canal 54GY/  . Hyperlipemia   . Hypertension   . Hypothyroidism   . PONV (postoperative nausea and vomiting)   . Skin cancer    Shingles 2012 Bad case    Past Surgical History:  Procedure Laterality Date  . ABDOMINAL HYSTERECTOMY    . APPENDECTOMY    . CESAREAN SECTION     2  . CHOLECYSTECTOMY    . COLONOSCOPY  08/31/13   invasive squamous cell colon  . ERCP    . ESOPHAGOGASTRODUODENOSCOPY  10/20/2012   Procedure: ESOPHAGOGASTRODUODENOSCOPY (EGD);  Surgeon: Arta Silence, MD;  Location: [facility];  Service: Endoscopy;  Laterality: Left;  . EYE SURGERY     cataracts  . KNEE ARTHROSCOPY  05/11/2012   Procedure: ARTHROSCOPY KNEE;  Surgeon: Hessie Dibble, MD;  Location: [facility];   Service: Orthopedics;  Laterality: Right;  left knee medial menisectomy and chondroplasty  . THYROIDECTOMY, PARTIAL    . TONSILLECTOMY      Family History  Problem Relation Age of Onset  . Kidney disease Mother   . Heart attack Father   . Heart disease Father   . Cancer Sister        breast  . Cancer Sister        ovarian  . Cancer Sister        melanoma  . Cancer Daughter        breast    Social History   Tobacco Use  . Smoking status: Never Smoker  . Smokeless tobacco: Never Used  Substance Use Topics  . Alcohol use: No  . Drug use: No    Prior to Admission medications   Medication Sig Start Date End Date Taking? Authorizing Provider  acetaminophen (TYLENOL) 325 MG tablet Take 650 mg by mouth 3 (three) times daily.    [provider]  ALPRAZolam Duanne Moron) 0.5 MG tablet Take 1 tablet (0.5 mg total) by mouth at bedtime. Patient taking differently: Take 0.5 mg by mouth See admin instructions. 0.5mg  daily at bedtime And 0.5mg  three times daily as needed for anxiety 05/03/21 06/02/21  [place] Donnamarie Poag, DO  ALPRAZolam Duanne Moron) 0.5 MG tablet Take  1 tablet (0.5 mg total) by mouth 3 (three) times daily as needed for anxiety. Patient not taking: Reported on 05/08/2021 05/03/21 06/02/21  [place] Eric J, DO  aspirin EC 81 MG EC tablet Take 1 tablet (81 mg total) by mouth daily. Swallow whole. 05/04/21   [place] Eric J, DO  buPROPion (WELLBUTRIN SR) 200 MG 12 hr tablet Take 200 mg by mouth daily.     [provider]  busPIRone (BUSPAR) 5 MG tablet Take 1 tablet (5 mg total) by mouth 2 (two) times daily. 05/03/21 06/02/21  [place] Donnamarie Poag, DO  carvedilol (COREG) 6.25 MG tablet Take 1 tablet (6.25 mg total) by mouth 2 (two) times daily. 09/05/20   [place], Peter M, MD  clopidogrel (PLAVIX) 75 MG tablet Take 1 tablet (75 mg total) by mouth daily. 05/04/21 05/04/22  [place] Donnamarie Poag, DO  docusate sodium (COLACE) 100 MG capsule Take 100 mg by mouth 2 (two) times daily.    [provider]  escitalopram (LEXAPRO) 10 MG tablet Take 2 tablets (20 mg total) by mouth daily. Patient not taking: Reported on 05/08/2021 05/04/21 06/03/21  [place] Donnamarie Poag, DO  escitalopram (LEXAPRO) 20 MG tablet Take 20 mg by mouth daily.    [provider]  gabapentin (NEURONTIN) 300 MG capsule Take 300 mg by mouth 2 (two) times daily as needed (pain).    [provider]  levothyroxine (SYNTHROID) 100 MCG tablet Take 100 mcg by mouth daily.    [provider]  loperamide (IMODIUM) 2 MG capsule Take 2 mg by mouth 4 (four) times daily as needed for diarrhea or loose stools.    [provider]  losartan (COZAAR) 25 MG tablet Take 0.5 tablets (12.5 mg total) by mouth daily. 05/04/21 08/02/21  [place] Eric J, DO  ondansetron (ZOFRAN ODT) 4 MG disintegrating tablet Take 1 tablet (4 mg total) by mouth every 8 (eight) hours as needed for nausea or vomiting. 07/06/17   Sherwood Gambler, MD  pantoprazole (PROTONIX) 40 MG tablet Take 40 mg by mouth daily.    [provider]  polyethylene glycol (MIRALAX / GLYCOLAX) 17 g packet Take 17 g by mouth See admin instructions. 17g every other day as needed for constipation    [provider]    Allergies Vancomycin, Zosyn [piperacillin sod-tazobactam so], Hydrocodone, and Morphine and related   REVIEW OF SYSTEMS  Negative except as noted here or in the History of Present Illness.   PHYSICAL EXAMINATION  Initial Vital Signs Blood pressure (!) 147/78, pulse 99, temperature 98.6 F (37 C), temperature source Oral, resp. rate 18, SpO2 93 %.  Examination General: Well-developed, well-nourished female in no acute distress; appearance consistent with age of record HENT: normocephalic; atraumatic; no active epistaxis; site of bleeding seen on septum of anterior left naris Eyes: pupils equal, round and reactive to light; extraocular muscles intact Neck: supple Heart: regular rate and rhythm Lungs: clear to  auscultation bilaterally Abdomen: soft; nondistended; nontender; bowel sounds present Extremities: No deformity; full range of motion; pulses normal Neurologic: Awake, alert; motor function intact in all extremities and symmetric; no facial droop Skin: Warm and dry Psychiatric: Flat affect   RESULTS  Summary of this visit's results, reviewed and interpreted by myself:   EKG Interpretation  Date/Time:    Ventricular Rate:    PR Interval:    QRS Duration:   QT Interval:    QTC Calculation:   R Axis:     Text Interpretation:  Laboratory Studies: No results found for this or any previous visit (from the past 24 hour(s)). Imaging Studies: No results found.  ED COURSE and MDM  Nursing notes, initial and subsequent vitals signs, including pulse oximetry, reviewed and interpreted by myself.  Vitals:   05/29/21 0523 05/29/21 0630  BP: (!) 135/59 (!) 147/78  Pulse: (!) 102 99  Resp: 18 18  Temp: 98.6 F (37 C)   TempSrc: Oral   SpO2: 91% 93%   Medications  ibuprofen (ADVIL) tablet 400 mg (400 mg Oral Given 05/29/21 0656)   Patient and family member advised that Afrin may work better if dripped into the nose with avoidance of sniffing or blowing.  She does have an ENT with whom she can follow-up if symptoms keep recurring.  PROCEDURES  Procedures CAUTERIZATION The patient's anterior medial left nasal septum was cauterized with 2 silver nitrate swabs.  The patient tolerated this well and there were no immediate complications.  The site remains hemostatic.  ED DIAGNOSES     ICD-10-CM   1. Anterior epistaxis  R04.0   2. Recurrent epistaxis  R04.0        Jabarie Pop, Jenny Reichmann, MD 05/29/21 [phone]

## 2021-05-29 NOTE — ED Provider Notes (Signed)
MSE was initiated and I personally evaluated the patient and placed orders (if any) at  6:08 AM on May 29, 2021.  The patient appears stable so that the remainder of the MSE may be completed by another provider.  No active bleeding although the site of the bleeding appears to be the anterior septum of the left naris.     Keslie Gritz, Jenny Reichmann, MD 05/29/21 [phone]

## 2021-05-29 NOTE — ED Notes (Signed)
Pt discharged, helped into wheelchair and assisted into daughters car who is taking her back to [facility] [place].

## 2021-06-10 ENCOUNTER — Other Ambulatory Visit: Payer: Self-pay

## 2021-06-10 ENCOUNTER — Ambulatory Visit: Payer: Medicare Other | Admitting: Physician Assistant

## 2021-06-10 ENCOUNTER — Emergency Department (HOSPITAL_BASED_OUTPATIENT_CLINIC_OR_DEPARTMENT_OTHER)
Admission: EM | Admit: 2021-06-10 | Discharge: 2021-06-10 | Disposition: A | Discharge status: Home / Self Care | Payer: Medicare Other | Attending: Emergency Medicine | Admitting: Emergency Medicine

## 2021-06-10 ENCOUNTER — Encounter (HOSPITAL_BASED_OUTPATIENT_CLINIC_OR_DEPARTMENT_OTHER): Payer: Self-pay | Admitting: Emergency Medicine

## 2021-06-10 ENCOUNTER — Telehealth: Payer: Self-pay | Admitting: Cardiology

## 2021-06-10 DIAGNOSIS — F0391 Unspecified dementia with behavioral disturbance: Secondary | ICD-10-CM | POA: Diagnosis not present

## 2021-06-10 DIAGNOSIS — F419 Anxiety disorder, unspecified: Secondary | ICD-10-CM | POA: Diagnosis not present

## 2021-06-10 DIAGNOSIS — M25551 Pain in right hip: Secondary | ICD-10-CM | POA: Diagnosis not present

## 2021-06-10 DIAGNOSIS — E039 Hypothyroidism, unspecified: Secondary | ICD-10-CM | POA: Insufficient documentation

## 2021-06-10 DIAGNOSIS — Z85828 Personal history of other malignant neoplasm of skin: Secondary | ICD-10-CM | POA: Diagnosis not present

## 2021-06-10 DIAGNOSIS — I5042 Chronic combined systolic (congestive) and diastolic (congestive) heart failure: Secondary | ICD-10-CM | POA: Diagnosis not present

## 2021-06-10 DIAGNOSIS — Z85038 Personal history of other malignant neoplasm of large intestine: Secondary | ICD-10-CM | POA: Insufficient documentation

## 2021-06-10 DIAGNOSIS — K219 Gastro-esophageal reflux disease without esophagitis: Secondary | ICD-10-CM | POA: Diagnosis not present

## 2021-06-10 DIAGNOSIS — F329 Major depressive disorder, single episode, unspecified: Secondary | ICD-10-CM | POA: Diagnosis not present

## 2021-06-10 DIAGNOSIS — I110 Hypertensive heart disease with heart failure: Secondary | ICD-10-CM | POA: Insufficient documentation

## 2021-06-10 DIAGNOSIS — R040 Epistaxis: Secondary | ICD-10-CM | POA: Insufficient documentation

## 2021-06-10 DIAGNOSIS — I2510 Atherosclerotic heart disease of native coronary artery without angina pectoris: Secondary | ICD-10-CM | POA: Diagnosis not present

## 2021-06-10 DIAGNOSIS — I10 Essential (primary) hypertension: Secondary | ICD-10-CM | POA: Diagnosis not present

## 2021-06-10 DIAGNOSIS — E782 Mixed hyperlipidemia: Secondary | ICD-10-CM | POA: Diagnosis not present

## 2021-06-10 MED ORDER — SILVER NITRATE-POT NITRATE 75-25 % EX MISC
1.0000 "application " | Freq: Once | CUTANEOUS | Status: AC
  Administered 2021-06-10: 1 via TOPICAL

## 2021-06-10 MED ORDER — LIDOCAINE-EPINEPHRINE (PF) 2 %-1:200000 IJ SOLN
10.0000 mL | Freq: Once | INTRAMUSCULAR | Status: AC
  Administered 2021-06-10: 10 mL
  Filled 2021-06-10: qty 20

## 2021-06-10 NOTE — ED Triage Notes (Signed)
States nose has been bleeding all morning ,  passed a clot also ,  has hx of this is on plavix and asa

## 2021-06-10 NOTE — Telephone Encounter (Signed)
Spoke with patient's daughter Patient has had recurrent nosebleeds since starting on Plavix + ASA She is in ED now and having nosebleed cauterized for the 5th time  Patient started on Plavix after NSTEMI   Daughter does not want patient on both meds [facility] would need an order faxed to them if patient is to change meds  Advised MD would have to review concerns and then we will follow up

## 2021-06-10 NOTE — Discharge Instructions (Addendum)

## 2021-06-10 NOTE — ED Provider Notes (Signed)
Emergency Department Provider Note   I have reviewed the triage vital signs and the nursing notes.   HISTORY  Chief Complaint Epistaxis   HPI Unika SALIMAH MARTINOVICH is a 87 y.o. female with PMH of recurrent epistaxis presents to the ED with bleeding from the left nostril. Family with patient report bleeding for the  left nostril for most of the AM. They have require cauterization twice and has seen ENT. They tried afrin without relief. No SOB. No anemia symptoms. No radiation of symptoms or modifying factors.   Past Medical History:  Diagnosis Date   Arthritis    Cancer ([place])    skin   Colon cancer ([place]) 08/31/13   invasive squamous cell   Deaf, left    Depression    GERD (gastroesophageal reflux disease)    History of radiation therapy 10/10/13-11/23/13   anal canal 54GY/   Hyperlipemia    Hypertension    Hypothyroidism    PONV (postoperative nausea and vomiting)    Skin cancer    Shingles 2012 Bad case    Patient Active Problem List   Diagnosis Date Noted   NSTEMI (non-ST elevated myocardial infarction) ([place]) 05/01/2021   Chronic combined systolic and diastolic heart failure ([place]) 05/01/2021   CHF (congestive heart failure) ([place]) 08/14/2020   DVT (deep venous thrombosis) ([place]) 07/29/2020   Pulmonary embolus ([place]) 07/28/2020   Anemia 07/28/2020   Pulmonary embolism ([place]) 07/28/2020   Acute respiratory failure with hypoxia ([place]) 07/28/2020   Dementia with behavioral disturbance ([place])    DNR (do not resuscitate)    Palliative care by specialist    Adult failure to thrive    Acetabular fracture ([place]) 07/07/2020   Sepsis ([place]) 03/09/2017   Nausea vomiting and diarrhea 03/09/2017   Elevated troponin 03/09/2017   Chest pain 12/25/2013   Dyspnea 12/25/2013   Restless leg syndrome 12/25/2013   Mucositis 10/27/2013   Hypophosphatemia 10/27/2013   Protein-calorie malnutrition, severe ([place]) 10/25/2013   Thrombocytopenia ([place]) 10/25/2013   Hypomagnesemia 10/24/2013    Febrile neutropenia ([place]) 10/23/2013   Anal cancer ([place]) 09/09/2013   Colon cancer ([place]) 08/31/2013   Colitis, acute 03/17/2013   Gastroparesis 10/22/2012   Hand pain, left 10/22/2012   Anxiety 10/18/2012   Hyperventilation syndrome 10/18/2012   Headache(784.0) 10/18/2012   Neck pain on right side 10/18/2012   Hypertension 10/18/2012   GERD (gastroesophageal reflux disease) 10/18/2012   Hyperlipidemia 10/18/2012   Insomnia 10/18/2012   Hearing loss sensory, bilateral 10/18/2012   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/18/2012   Weakness generalized 10/17/2012   Hyponatremia 10/17/2012   Hypokalemia 10/17/2012   Hypothyroidism 10/17/2012   Fatigue 10/17/2012    Past Surgical History:  Procedure Laterality Date   ABDOMINAL HYSTERECTOMY     APPENDECTOMY     CESAREAN SECTION     2   CHOLECYSTECTOMY     COLONOSCOPY  08/31/13   invasive squamous cell colon   ERCP     ESOPHAGOGASTRODUODENOSCOPY  10/20/2012   Procedure: ESOPHAGOGASTRODUODENOSCOPY (EGD);  Surgeon: Arta Silence, MD;  Location: [facility];  Service: Endoscopy;  Laterality: Left;   EYE SURGERY     cataracts   KNEE ARTHROSCOPY  05/11/2012   Procedure: ARTHROSCOPY KNEE;  Surgeon: Hessie Dibble, MD;  Location: [facility];  Service: Orthopedics;  Laterality: Right;  left knee medial menisectomy and chondroplasty   THYROIDECTOMY, PARTIAL     TONSILLECTOMY      Allergies Vancomycin, Zosyn [piperacillin sod-tazobactam so], Hydrocodone, and Morphine  and related  Family History  Problem Relation Age of Onset   Kidney disease Mother    Heart attack Father    Heart disease Father    Cancer Sister        breast   Cancer Sister        ovarian   Cancer Sister        melanoma   Cancer Daughter        breast    Social History Social History   Tobacco Use   Smoking status: Never   Smokeless tobacco: Never  Substance Use Topics   Alcohol use: No   Drug use: No    Review of  Systems  Constitutional: No fever/chills ENT: No sore throat. Positive nosebleed.  Cardiovascular: Denies chest pain. Respiratory: Denies shortness of breath. Gastrointestinal:No nausea, no vomiting.    ____________________________________________   PHYSICAL EXAM:  VITAL SIGNS: ED Triage Vitals  Enc Vitals Group     BP 06/10/21 1256 (!) 156/79     Pulse Rate 06/10/21 1256 77     Resp 06/10/21 1256 18     Temp 06/10/21 1256 98.3 F (36.8 C)     Temp Source 06/10/21 1256 Oral     SpO2 06/10/21 1256 97 %     Weight 06/10/21 1252 145 lb (65.8 kg)     Height 06/10/21 1252 5\' 6"  (1.676 m)   Constitutional: Alert and oriented. Well appearing and in no acute distress. Eyes: Conjunctivae are normal.  Head: Atraumatic. Nose: No active heavy bleeding. Small area along the nasal septum on the left with mild oozing.  Mouth/Throat: Mucous membranes are moist.   Neck: No stridor.   Cardiovascular: Good peripheral circulation.   Respiratory: Normal respiratory effort.   Gastrointestinal: No distention.  Musculoskeletal: No gross deformities of extremities. Neurologic:  Normal speech and language. Skin:  Skin is warm, dry and intact. No rash noted.   ____________________________________________   PROCEDURES  Procedure(s) performed:   .Epistaxis Management  Date/Time: 06/10/2021 8:14 PM Performed by: Margette Fast, MD Authorized by: Margette Fast, MD   Consent:    Consent obtained:  Verbal   Consent given by:  Patient   Risks, benefits, and alternatives were discussed: yes     Risks discussed:  Bleeding, infection, nasal injury and pain Universal protocol:    Patient identity confirmed:  Verbally with patient Anesthesia:    Anesthesia method:  Topical application   Topical anesthetic:  Lidocaine gel and epinephrine Procedure details:    Treatment site:  L anterior   Treatment method:  Silver nitrate   Treatment complexity:  Limited   Treatment episode: recurring    Post-procedure details:    Assessment:  Bleeding stopped   Procedure completion:  Tolerated well, no immediate complications   ____________________________________________   INITIAL IMPRESSION / ASSESSMENT AND PLAN / ED COURSE  Pertinent labs & imaging results that were available during my care of the patient were reviewed by me and considered in my medical decision making (see chart for details).   Patient presents to the ED with recurrent epistaxis. Area of bleeding identified. Lidocaine with epi used for hemostasis and anesthesia. Patient tolerated silver nitrate without pain. Bleeding stopped. Plan for ENT and PCP follow up. Provided supplies and nasal clamp to family.    ____________________________________________  FINAL CLINICAL IMPRESSION(S) / ED DIAGNOSES  Final diagnoses:  Epistaxis    MEDICATIONS GIVEN DURING THIS VISIT:  Medications  lidocaine-EPINEPHrine (XYLOCAINE W/EPI) 2 %-1:200000 (PF) injection 10 mL (  10 mLs Other Given by Other 06/10/21 1338)  silver nitrate applicators applicator 1 application (1 application Topical Given by Other 06/10/21 1338)    Note:  This document was prepared using Dragon voice recognition software and may include unintentional dictation errors.  Nanda Quinton, MD, [facility] Emergency Medicine    Corabelle Spackman, Wonda Olds, MD 06/10/21 2016

## 2021-06-10 NOTE — Telephone Encounter (Signed)
She can stop Plavix  Denelda Akerley [place] MD, [facility]

## 2021-06-10 NOTE — Telephone Encounter (Signed)
Patient's daughter called to that the patient is in the hospital with a nose bleed that they are unable to stop. Patient dont wants her taking off the blood things and to find something. Please advise   Appt for today was cancel

## 2021-06-14 NOTE — Telephone Encounter (Signed)
Spoke to patient's daughter Shana Chute on Mon 6/20.Dr.Jordan advised patient can stop Plavix.Order faxed to [facility] at fax # [phone].

## 2021-07-08 ENCOUNTER — Emergency Department (HOSPITAL_BASED_OUTPATIENT_CLINIC_OR_DEPARTMENT_OTHER)
Admission: EM | Admit: 2021-07-08 | Discharge: 2021-07-08 | Discharge status: Absent without leave | Payer: Medicare Other

## 2021-07-08 ENCOUNTER — Observation Stay (HOSPITAL_COMMUNITY)
Admission: EM | Admit: 2021-07-08 | Discharge: 2021-07-10 | Disposition: A | Discharge status: Home / Self Care | Payer: Medicare Other | Attending: Internal Medicine | Admitting: Internal Medicine

## 2021-07-08 ENCOUNTER — Other Ambulatory Visit: Payer: Self-pay

## 2021-07-08 DIAGNOSIS — E039 Hypothyroidism, unspecified: Secondary | ICD-10-CM | POA: Insufficient documentation

## 2021-07-08 DIAGNOSIS — Z85048 Personal history of other malignant neoplasm of rectum, rectosigmoid junction, and anus: Secondary | ICD-10-CM | POA: Insufficient documentation

## 2021-07-08 DIAGNOSIS — Z85828 Personal history of other malignant neoplasm of skin: Secondary | ICD-10-CM | POA: Diagnosis not present

## 2021-07-08 DIAGNOSIS — I10 Essential (primary) hypertension: Secondary | ICD-10-CM | POA: Insufficient documentation

## 2021-07-08 DIAGNOSIS — Z85038 Personal history of other malignant neoplasm of large intestine: Secondary | ICD-10-CM | POA: Diagnosis not present

## 2021-07-08 DIAGNOSIS — Z20822 Contact with and (suspected) exposure to covid-19: Secondary | ICD-10-CM | POA: Diagnosis not present

## 2021-07-08 DIAGNOSIS — E559 Vitamin D deficiency, unspecified: Secondary | ICD-10-CM | POA: Diagnosis not present

## 2021-07-08 DIAGNOSIS — M25551 Pain in right hip: Secondary | ICD-10-CM | POA: Diagnosis not present

## 2021-07-08 DIAGNOSIS — Z7982 Long term (current) use of aspirin: Secondary | ICD-10-CM | POA: Insufficient documentation

## 2021-07-08 DIAGNOSIS — F419 Anxiety disorder, unspecified: Secondary | ICD-10-CM | POA: Diagnosis not present

## 2021-07-08 DIAGNOSIS — I5043 Acute on chronic combined systolic (congestive) and diastolic (congestive) heart failure: Secondary | ICD-10-CM

## 2021-07-08 DIAGNOSIS — E782 Mixed hyperlipidemia: Secondary | ICD-10-CM | POA: Diagnosis not present

## 2021-07-08 DIAGNOSIS — R062 Wheezing: Secondary | ICD-10-CM | POA: Diagnosis not present

## 2021-07-08 DIAGNOSIS — D649 Anemia, unspecified: Principal | ICD-10-CM | POA: Insufficient documentation

## 2021-07-08 DIAGNOSIS — R0602 Shortness of breath: Secondary | ICD-10-CM

## 2021-07-08 DIAGNOSIS — Z79899 Other long term (current) drug therapy: Secondary | ICD-10-CM | POA: Diagnosis not present

## 2021-07-08 DIAGNOSIS — K920 Hematemesis: Secondary | ICD-10-CM | POA: Diagnosis not present

## 2021-07-08 LAB — CBC WITH DIFFERENTIAL/PLATELET
Abs Immature Granulocytes: 0.01 10*3/uL (ref 0.00–0.07)
Basophils Absolute: 0.1 10*3/uL (ref 0.0–0.1)
Basophils Relative: 1 %
Eosinophils Absolute: 0.2 10*3/uL (ref 0.0–0.5)
Eosinophils Relative: 4 %
HCT: 24 % — ABNORMAL LOW (ref 36.0–46.0)
Hemoglobin: 6.7 g/dL — CL (ref 12.0–15.0)
Immature Granulocytes: 0 %
Lymphocytes Relative: 23 %
Lymphs Abs: 1 10*3/uL (ref 0.7–4.0)
MCH: 24.8 pg — ABNORMAL LOW (ref 26.0–34.0)
MCHC: 27.9 g/dL — ABNORMAL LOW (ref 30.0–36.0)
MCV: 88.9 fL (ref 80.0–100.0)
Monocytes Absolute: 0.5 10*3/uL (ref 0.1–1.0)
Monocytes Relative: 10 %
Neutro Abs: 2.7 10*3/uL (ref 1.7–7.7)
Neutrophils Relative %: 62 %
Platelets: 345 10*3/uL (ref 150–400)
RBC: 2.7 MIL/uL — ABNORMAL LOW (ref 3.87–5.11)
RDW: 17 % — ABNORMAL HIGH (ref 11.5–15.5)
WBC: 4.4 10*3/uL (ref 4.0–10.5)
nRBC: 0 % (ref 0.0–0.2)

## 2021-07-08 LAB — PREPARE RBC (CROSSMATCH)

## 2021-07-08 LAB — BASIC METABOLIC PANEL
Anion gap: 9 (ref 5–15)
BUN: 16 mg/dL (ref 8–23)
CO2: 24 mmol/L (ref 22–32)
Calcium: 9.2 mg/dL (ref 8.9–10.3)
Chloride: 103 mmol/L (ref 98–111)
Creatinine, Ser: 1.12 mg/dL — ABNORMAL HIGH (ref 0.44–1.00)
GFR, Estimated: 47 mL/min — ABNORMAL LOW (ref >60)
Glucose, Bld: 109 mg/dL — ABNORMAL HIGH (ref 70–99)
Potassium: 3.6 mmol/L (ref 3.5–5.1)
Sodium: 136 mmol/L (ref 135–145)

## 2021-07-08 LAB — POC OCCULT BLOOD, ED: Fecal Occult Bld: NEGATIVE

## 2021-07-08 MED ORDER — PANTOPRAZOLE SODIUM 40 MG PO TBEC
40.0000 mg | DELAYED_RELEASE_TABLET | Freq: Every day | ORAL | Status: DC
  Administered 2021-07-09: 40 mg via ORAL
  Filled 2021-07-08: qty 1

## 2021-07-08 MED ORDER — ONDANSETRON HCL 4 MG/2ML IJ SOLN
4.0000 mg | Freq: Four times a day (QID) | INTRAMUSCULAR | Status: DC | PRN
  Administered 2021-07-09: 4 mg via INTRAVENOUS
  Filled 2021-07-08: qty 2

## 2021-07-08 MED ORDER — BUPROPION HCL ER (SR) 100 MG PO TB12
200.0000 mg | ORAL_TABLET | Freq: Every day | ORAL | Status: DC
  Administered 2021-07-09 – 2021-07-10 (×2): 200 mg via ORAL
  Filled 2021-07-08 (×2): qty 2

## 2021-07-08 MED ORDER — MELATONIN 3 MG PO TABS
3.0000 mg | ORAL_TABLET | Freq: Every evening | ORAL | Status: DC | PRN
  Administered 2021-07-09: 3 mg via ORAL
  Filled 2021-07-08: qty 1

## 2021-07-08 MED ORDER — METOPROLOL TARTRATE 5 MG/5ML IV SOLN: 2.5000 mg | Freq: Four times a day (QID) | INTRAVENOUS | Status: DC | PRN

## 2021-07-08 MED ORDER — SODIUM CHLORIDE 0.9% IV SOLUTION: Freq: Once | INTRAVENOUS | Status: AC

## 2021-07-08 MED ORDER — ACETAMINOPHEN 325 MG PO TABS
650.0000 mg | ORAL_TABLET | Freq: Four times a day (QID) | ORAL | Status: DC | PRN
  Administered 2021-07-09: 650 mg via ORAL
  Filled 2021-07-08: qty 2

## 2021-07-08 MED ORDER — ASPIRIN EC 81 MG PO TBEC
81.0000 mg | DELAYED_RELEASE_TABLET | Freq: Every day | ORAL | Status: DC
  Administered 2021-07-09 – 2021-07-10 (×2): 81 mg via ORAL
  Filled 2021-07-08 (×3): qty 1

## 2021-07-08 MED ORDER — CARVEDILOL 6.25 MG PO TABS
6.2500 mg | ORAL_TABLET | Freq: Two times a day (BID) | ORAL | Status: DC
  Administered 2021-07-09 – 2021-07-10 (×3): 6.25 mg via ORAL
  Filled 2021-07-08: qty 2
  Filled 2021-07-08: qty 1
  Filled 2021-07-08: qty 2

## 2021-07-08 MED ORDER — ENOXAPARIN SODIUM 40 MG/0.4ML IJ SOSY
40.0000 mg | PREFILLED_SYRINGE | Freq: Every day | INTRAMUSCULAR | Status: DC
  Administered 2021-07-09: 40 mg via SUBCUTANEOUS
  Filled 2021-07-08: qty 0.4

## 2021-07-08 MED ORDER — POLYETHYLENE GLYCOL 3350 17 G PO PACK: 17.0000 g | PACK | Freq: Every day | ORAL | Status: DC | PRN

## 2021-07-08 MED ORDER — GABAPENTIN 300 MG PO CAPS
300.0000 mg | ORAL_CAPSULE | Freq: Two times a day (BID) | ORAL | Status: DC | PRN
  Administered 2021-07-09: 300 mg via ORAL
  Filled 2021-07-08: qty 1

## 2021-07-08 MED ORDER — LEVOTHYROXINE SODIUM 100 MCG PO TABS
100.0000 ug | ORAL_TABLET | Freq: Every day | ORAL | Status: DC
  Administered 2021-07-09 – 2021-07-10 (×2): 100 ug via ORAL
  Filled 2021-07-08 (×2): qty 1

## 2021-07-08 MED ORDER — CLOPIDOGREL BISULFATE 75 MG PO TABS
75.0000 mg | ORAL_TABLET | Freq: Every day | ORAL | Status: DC
  Filled 2021-07-08: qty 1

## 2021-07-08 MED ORDER — ESCITALOPRAM OXALATE 20 MG PO TABS
20.0000 mg | ORAL_TABLET | Freq: Every day | ORAL | Status: DC
  Administered 2021-07-09 – 2021-07-10 (×2): 20 mg via ORAL
  Filled 2021-07-08: qty 1
  Filled 2021-07-08: qty 2

## 2021-07-08 NOTE — ED Notes (Signed)
Provided pt with water.

## 2021-07-08 NOTE — ED Notes (Signed)
Critical lab resulted, called pt daughter and informed her to come back to the ED and notify charge in upon arrival

## 2021-07-08 NOTE — ED Notes (Signed)
Notified resident that phlebotomist will get blood work.

## 2021-07-08 NOTE — ED Triage Notes (Signed)
Stated she was sent by her doctor for blood transfusion; hx of nosebleed (resolved at this time); patient reported she was on blood thinners for her heart but was discontinued about a week ago,

## 2021-07-08 NOTE — H&P (Addendum)
History and Physical  AVAEH EWER EHU:[id number] DOB: 1930/10/23 DOA: 07/08/2021  Referring physician: Dr. Raymondo Band, EDP  PCP: Clovia Cuff, MD  Outpatient Specialists: ENT Patient coming from: Home  Chief Complaint: Abnormal lab, sent by her PCP.  HPI: Cindy [name] is a 87 y.o. female with medical history significant for anal cancer, anemia of chronic disease, essential hypertension, polyneuropathy, hypothyroidism, chronic anxiety/depression, GERD who presented to [facility] ED from her PCPs office due to low hemoglobin.  She endorses occasional shortness of breath.  She had a recent nosebleed for which she was seen in the ED about a month ago while on aspirin and Plavix.  She has been off DAPT for the past 10 days without recurrence of epistaxis.  Work-up in the ED revealed no overt bleeding with hemoglobin of 6.7.  1 unit PRBCs was ordered to be transfused by EDP.  TRH, hospitalist team, was asked to admit.  GI has been consulted.  While in the ED she developed acute onset of coughing spell.  Was given duo nebs.  Chest x-ray was done revealing mild interstitial infiltrates.  COVID-19 screening test pending.  ED Course: Temperature 98.5.  BP 163/126, pulse 103, respiration rate 17, O2 saturation 96% on room air.  Review of Systems: Review of systems as noted in the HPI. All other systems reviewed and are negative.   Past Medical History:  Diagnosis Date   Arthritis    Cancer ([place])    skin   Colon cancer ([place]) 08/31/13   invasive squamous cell   Deaf, left    Depression    GERD (gastroesophageal reflux disease)    History of radiation therapy 10/10/13-11/23/13   anal canal 54GY/   Hyperlipemia    Hypertension    Hypothyroidism    PONV (postoperative nausea and vomiting)    Skin cancer    Shingles 2012 Bad case   Past Surgical History:  Procedure Laterality Date   ABDOMINAL HYSTERECTOMY     APPENDECTOMY     CESAREAN SECTION     2   CHOLECYSTECTOMY     COLONOSCOPY   08/31/13   invasive squamous cell colon   ERCP     ESOPHAGOGASTRODUODENOSCOPY  10/20/2012   Procedure: ESOPHAGOGASTRODUODENOSCOPY (EGD);  Surgeon: Arta Silence, MD;  Location: [facility];  Service: Endoscopy;  Laterality: Left;   EYE SURGERY     cataracts   KNEE ARTHROSCOPY  05/11/2012   Procedure: ARTHROSCOPY KNEE;  Surgeon: Hessie Dibble, MD;  Location: [facility];  Service: Orthopedics;  Laterality: Right;  left knee medial menisectomy and chondroplasty   THYROIDECTOMY, PARTIAL     TONSILLECTOMY      Social History:  reports that she has never smoked. She has never used smokeless tobacco. She reports that she does not drink alcohol and does not use drugs.   Allergies  Allergen Reactions   Vancomycin Hives    Zosyn and vanc given together; unclear which was precipitating med. Appears to have tolerated cephalosporins in the past.   Zosyn [Piperacillin Sod-Tazobactam So] Hives    Zosyn and vanc given together; unclear which was precipitating med. Appears to have tolerated cephalosporins in the past.   Hydrocodone Nausea Only   Morphine And Related Hives    Family History  Problem Relation Age of Onset   Kidney disease Mother    Heart attack Father    Heart disease Father    Cancer Sister        breast   Cancer Sister  ovarian   Cancer Sister        melanoma   Cancer Daughter        breast      Prior to Admission medications   Medication Sig Start Date End Date Taking? Authorizing Provider  acetaminophen (TYLENOL) 325 MG tablet Take 650 mg by mouth 3 (three) times daily.    [provider]  aspirin EC 81 MG EC tablet Take 1 tablet (81 mg total) by mouth daily. Swallow whole. 05/04/21   [place] Eric J, DO  buPROPion (WELLBUTRIN SR) 200 MG 12 hr tablet Take 200 mg by mouth daily.     [provider]  carvedilol (COREG) 6.25 MG tablet Take 1 tablet (6.25 mg total) by mouth 2 (two) times daily. 09/05/20   [place], Peter M, MD   clopidogrel (PLAVIX) 75 MG tablet Take 1 tablet (75 mg total) by mouth daily. 05/04/21 05/04/22  [place] Donnamarie Poag, DO  docusate sodium (COLACE) 100 MG capsule Take 100 mg by mouth 2 (two) times daily.    [provider]  escitalopram (LEXAPRO) 10 MG tablet Take 2 tablets (20 mg total) by mouth daily. Patient not taking: Reported on 05/08/2021 05/04/21 06/03/21  [place] Donnamarie Poag, DO  escitalopram (LEXAPRO) 20 MG tablet Take 20 mg by mouth daily.    [provider]  gabapentin (NEURONTIN) 300 MG capsule Take 300 mg by mouth 2 (two) times daily as needed (pain).    [provider]  levothyroxine (SYNTHROID) 100 MCG tablet Take 100 mcg by mouth daily.    [provider]  loperamide (IMODIUM) 2 MG capsule Take 2 mg by mouth 4 (four) times daily as needed for diarrhea or loose stools.    [provider]  losartan (COZAAR) 25 MG tablet Take 0.5 tablets (12.5 mg total) by mouth daily. 05/04/21 08/02/21  [place] Eric J, DO  ondansetron (ZOFRAN ODT) 4 MG disintegrating tablet Take 1 tablet (4 mg total) by mouth every 8 (eight) hours as needed for nausea or vomiting. 07/06/17   Sherwood Gambler, MD  pantoprazole (PROTONIX) 40 MG tablet Take 40 mg by mouth daily.    [provider]  polyethylene glycol (MIRALAX / GLYCOLAX) 17 g packet Take 17 g by mouth See admin instructions. 17g every other day as needed for constipation    [provider]    Physical Exam: BP (!) 163/126   Pulse (!) 103   Temp 98.5 F (36.9 C) (Oral)   Resp 17   Ht 5\' 6"  (1.676 m)   Wt 63.5 kg   SpO2 96%   BMI 22.60 kg/m   General: 87 y.o. year-old female well developed well nourished in no acute distress.  Alert and oriented x3. Cardiovascular: Regular rate and rhythm with no rubs or gallops.  No thyromegaly or JVD noted.  No lower extremity edema. 2/4 pulses in all 4 extremities. Respiratory: Mild rales diffusely.  Poor inspiratory effort. Abdomen: Soft nontender  nondistended with normal bowel sounds x4 quadrants. Muskuloskeletal: No cyanosis, clubbing or edema noted bilaterally Neuro: CN II-XII intact, strength, sensation, reflexes Skin: No ulcerative lesions noted or rashes Psychiatry: Judgement and insight appear normal. Mood is appropriate for condition and setting          Labs on Admission:  Basic Metabolic Panel: Recent Labs  Lab 07/08/21 1752  NA 136  K 3.6  CL 103  CO2 24  GLUCOSE 109*  BUN 16  CREATININE 1.12*  CALCIUM 9.2   Liver Function Tests:  No results for input(s): AST, ALT, ALKPHOS, BILITOT, PROT, ALBUMIN in the last 168 hours. No results for input(s): LIPASE, AMYLASE in the last 168 hours. No results for input(s): AMMONIA in the last 168 hours. CBC: Recent Labs  Lab 07/08/21 1752  WBC 4.4  NEUTROABS 2.7  HGB 6.7*  HCT 24.0*  MCV 88.9  PLT 345   Cardiac Enzymes: No results for input(s): CKTOTAL, CKMB, CKMBINDEX, TROPONINI in the last 168 hours.  BNP (last 3 results) Recent Labs    07/28/20 0648 07/28/20 1453 05/01/21 0330  BNP 597.5* 969.7* 457.9*    ProBNP (last 3 results) No results for input(s): PROBNP in the last 8760 hours.  CBG: No results for input(s): GLUCAP in the last 168 hours.  Radiological Exams on Admission: No results found.  EKG: I independently viewed the EKG done and my findings are as followed: None available at the time of this visit.  Assessment/Plan Present on Admission:  Symptomatic anemia  Active Problems:   Symptomatic anemia  Symptomatic anemia, no overt bleeding in the setting of anemia of chronic disease She has followed with hematology oncology in the past Sent by PCP for low Hg Hemoglobin 6.7 in the ED, 1 unit PRBC ordered to be transfused. Obtain anemia work-up Obtain FOBT Repeat CBC posttransfusion Recommend follow-up with GI outpatient Last EGD was in 2013, showed mild bulbar duodenitis Will make NPO after midnight  [facility] made aware of admission,  routine consult.  Cough, acute onset Coughing spell while in the ED Mild pulmonary infiltrates, COVID-19 screening test pending. BNP ordered, pending DuoNebs and as needed antitussives ordered  Essential hypertension BP is not at goal, elevated Resume home oral antihypertensives IV antihypertensives as needed with parameters  Hypothyroidism Resume home levothyroxine  Chronic anxiety/depression Resume home escitalopram  GERD Resume home PPI  Polyneuropathy Resume home gabapentin  History of anal cancer Follows with hematology oncology and radiation oncology    DVT prophylaxis: Subcu Lovenox daily  Code Status: Full code  Family Communication: Son at bedside.  Disposition Plan: Admit to telemetry medical  Consults called: Secure chatted Dr. Cristina Gong, [place] Ill for a routine consultation in the morning.  Admission status: Observation status.   Status is: Observation    Dispo:  Patient From: Home  Planned Disposition: Home, possibly on 07/09/2021 or when GI signs off.  Medically stable for discharge: No      Kayleen Memos MD [facility] Pager [phone]  If 7PM-7AM, please contact night-coverage [email or web address] Password [facility]  07/08/2021, 10:28 PM

## 2021-07-08 NOTE — ED Provider Notes (Signed)
Emergency Medicine Provider Triage Evaluation Note  Cindy [name] , a 87 y.o. female  was evaluated in triage.  Pt complains of abnormal lab. Patient was sent by PCP due to low hemoglobin, unsure what the value was. Denies melena, hematochezia, and hematemesis. Intermittent shortness of breath with no change from baseline. Denies chest pain. Admits to a heavy nose bleed 3 weeks ago. Recently stopped blood thinners 3 weeks ago.   Review of Systems  Positive: SOB Negative:   Physical Exam  BP 137/69   Pulse 72   Temp 98.5 F (36.9 C) (Oral)   Resp 19   Ht 5\' 6"  (1.676 m)   Wt 63.5 kg   SpO2 100%   BMI 22.60 kg/m  Gen:   Awake, no distress   Resp:  Normal effort  MSK:   Moves extremities without difficulty  Other:    Medical Decision Making  Medically screening exam initiated at 5:51 PM.  Appropriate orders placed.  Cindy [name] was informed that the remainder of the evaluation will be completed by another provider, this initial triage assessment does not replace that evaluation, and the importance of remaining in the ED until their evaluation is complete.  Labs to check hemoglobin level   Karie Kirks 07/08/21 1753    Pattricia Boss, MD 07/09/21 [phone]

## 2021-07-08 NOTE — ED Notes (Signed)
PT decided to leave. Wait to long

## 2021-07-08 NOTE — ED Provider Notes (Signed)
[facility] EMERGENCY DEPARTMENT Provider Note   CSN: [id number] Arrival date & time: 07/08/21  1715     History Chief Complaint  Patient presents with   Abnormal Lab    Cindy [name] is a 87 y.o. female presenting to the ED from her PCP's office due to low Hb. Patient states she feels fine overall, but was told to come in by her PCP because of her labs. Patient does endorse occasional shortness of breath. She denies any fevers, chills, fatigue, lightheadedness, dizziness, recent epistaxis, chest pain, palpitations, abd pain, n/v/d, melena, hematochezia, dysuria, hematuria, or any other sxs at this time. Patient was seen in the ED about 1 month ago for epistaxis while on aspirin and Plavix, however, she states she has been off of these medications for about 10 days. Patient has not had a nose bleed since then.   The history is provided by the patient.  Abnormal Lab Patient referred by:  PCP Result type: hematology   Hematology:    Hemoglobin:  Low     Past Medical History:  Diagnosis Date   Arthritis    Cancer ([place])    skin   Colon cancer ([place]) 08/31/13   invasive squamous cell   Deaf, left    Depression    GERD (gastroesophageal reflux disease)    History of radiation therapy 10/10/13-11/23/13   anal canal 54GY/   Hyperlipemia    Hypertension    Hypothyroidism    PONV (postoperative nausea and vomiting)    Skin cancer    Shingles 2012 Bad case    Patient Active Problem List   Diagnosis Date Noted   Symptomatic anemia 07/08/2021   NSTEMI (non-ST elevated myocardial infarction) ([place]) 05/01/2021   Chronic combined systolic and diastolic heart failure ([place]) 05/01/2021   CHF (congestive heart failure) ([place]) 08/14/2020   DVT (deep venous thrombosis) ([place]) 07/29/2020   Pulmonary embolus ([place]) 07/28/2020   Anemia 07/28/2020   Pulmonary embolism ([place]) 07/28/2020   Acute respiratory failure with hypoxia ([place]) 07/28/2020   Dementia with behavioral  disturbance ([place])    DNR (do not resuscitate)    Palliative care by specialist    Adult failure to thrive    Acetabular fracture ([place]) 07/07/2020   Sepsis ([place]) 03/09/2017   Nausea vomiting and diarrhea 03/09/2017   Elevated troponin 03/09/2017   Chest pain 12/25/2013   Dyspnea 12/25/2013   Restless leg syndrome 12/25/2013   Mucositis 10/27/2013   Hypophosphatemia 10/27/2013   Protein-calorie malnutrition, severe ([place]) 10/25/2013   Thrombocytopenia ([place]) 10/25/2013   Hypomagnesemia 10/24/2013   Febrile neutropenia ([place]) 10/23/2013   Anal cancer ([place]) 09/09/2013   Colon cancer ([place]) 08/31/2013   Colitis, acute 03/17/2013   Gastroparesis 10/22/2012   Hand pain, left 10/22/2012   Anxiety 10/18/2012   Hyperventilation syndrome 10/18/2012   Headache(784.0) 10/18/2012   Neck pain on right side 10/18/2012   Hypertension 10/18/2012   GERD (gastroesophageal reflux disease) 10/18/2012   Hyperlipidemia 10/18/2012   Insomnia 10/18/2012   Hearing loss sensory, bilateral 10/18/2012   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/18/2012   Weakness generalized 10/17/2012   Hyponatremia 10/17/2012   Hypokalemia 10/17/2012   Hypothyroidism 10/17/2012   Fatigue 10/17/2012    Past Surgical History:  Procedure Laterality Date   ABDOMINAL HYSTERECTOMY     APPENDECTOMY     CESAREAN SECTION     2   CHOLECYSTECTOMY     COLONOSCOPY  08/31/13   invasive squamous cell colon   ERCP  ESOPHAGOGASTRODUODENOSCOPY  10/20/2012   Procedure: ESOPHAGOGASTRODUODENOSCOPY (EGD);  Surgeon: Arta Silence, MD;  Location: [facility];  Service: Endoscopy;  Laterality: Left;   EYE SURGERY     cataracts   KNEE ARTHROSCOPY  05/11/2012   Procedure: ARTHROSCOPY KNEE;  Surgeon: Hessie Dibble, MD;  Location: [facility];  Service: Orthopedics;  Laterality: Right;  left knee medial menisectomy and chondroplasty   THYROIDECTOMY, PARTIAL     TONSILLECTOMY       OB History   No obstetric history on  file.     Family History  Problem Relation Age of Onset   Kidney disease Mother    Heart attack Father    Heart disease Father    Cancer Sister        breast   Cancer Sister        ovarian   Cancer Sister        melanoma   Cancer Daughter        breast    Social History   Tobacco Use   Smoking status: Never   Smokeless tobacco: Never  Substance Use Topics   Alcohol use: No   Drug use: No    Home Medications Prior to Admission medications   Medication Sig Start Date End Date Taking? Authorizing Provider  acetaminophen (TYLENOL) 325 MG tablet Take 650 mg by mouth 3 (three) times daily.    [provider]  aspirin EC 81 MG EC tablet Take 1 tablet (81 mg total) by mouth daily. Swallow whole. 05/04/21   [place] Eric J, DO  buPROPion (WELLBUTRIN SR) 200 MG 12 hr tablet Take 200 mg by mouth daily.     [provider]  carvedilol (COREG) 6.25 MG tablet Take 1 tablet (6.25 mg total) by mouth 2 (two) times daily. 09/05/20   [place], Peter M, MD  clopidogrel (PLAVIX) 75 MG tablet Take 1 tablet (75 mg total) by mouth daily. 05/04/21 05/04/22  [place] Donnamarie Poag, DO  docusate sodium (COLACE) 100 MG capsule Take 100 mg by mouth 2 (two) times daily.    [provider]  escitalopram (LEXAPRO) 10 MG tablet Take 2 tablets (20 mg total) by mouth daily. Patient not taking: Reported on 05/08/2021 05/04/21 06/03/21  [place] Donnamarie Poag, DO  escitalopram (LEXAPRO) 20 MG tablet Take 20 mg by mouth daily.    [provider]  gabapentin (NEURONTIN) 300 MG capsule Take 300 mg by mouth 2 (two) times daily as needed (pain).    [provider]  levothyroxine (SYNTHROID) 100 MCG tablet Take 100 mcg by mouth daily.    [provider]  loperamide (IMODIUM) 2 MG capsule Take 2 mg by mouth 4 (four) times daily as needed for diarrhea or loose stools.    [provider]  losartan (COZAAR) 25 MG tablet Take 0.5 tablets (12.5 mg total) by mouth daily. 05/04/21  08/02/21  [place] Eric J, DO  ondansetron (ZOFRAN ODT) 4 MG disintegrating tablet Take 1 tablet (4 mg total) by mouth every 8 (eight) hours as needed for nausea or vomiting. 07/06/17   Sherwood Gambler, MD  pantoprazole (PROTONIX) 40 MG tablet Take 40 mg by mouth daily.    [provider]  polyethylene glycol (MIRALAX / GLYCOLAX) 17 g packet Take 17 g by mouth See admin instructions. 17g every other day as needed for constipation    [provider]    Allergies    Vancomycin, Zosyn [piperacillin sod-tazobactam so], Hydrocodone, and Morphine and related  Review  of Systems   Review of Systems  Constitutional:  Negative for chills, fatigue and fever.  HENT:  Negative for nosebleeds, sore throat and tinnitus.   Respiratory:  Negative for cough and shortness of breath.   Cardiovascular:  Negative for chest pain and palpitations.  Gastrointestinal:  Negative for abdominal pain, anal bleeding, blood in stool, constipation, diarrhea, nausea and vomiting.  Genitourinary:  Negative for difficulty urinating, dysuria and hematuria.  Musculoskeletal:  Negative for arthralgias and back pain.   Physical Exam Updated Vital Signs BP (!) 163/126   Pulse (!) 103   Temp 98.5 F (36.9 C) (Oral)   Resp 17   Ht 5\' 6"  (1.676 m)   Wt 63.5 kg   SpO2 96%   BMI 22.60 kg/m   Physical Exam Constitutional:      General: She is not in acute distress.    Appearance: Normal appearance.  HENT:     Head: Normocephalic and atraumatic.     Nose: Nose normal.     Mouth/Throat:     Mouth: Mucous membranes are moist.  Eyes:     Pupils: Pupils are equal, round, and reactive to light.  Cardiovascular:     Rate and Rhythm: Normal rate and regular rhythm.     Pulses: Normal pulses.     Heart sounds: Normal heart sounds.  Pulmonary:     Effort: Pulmonary effort is normal. No respiratory distress.     Breath sounds: Normal breath sounds. No wheezing, rhonchi or rales.  Abdominal:     General:  Bowel sounds are normal. There is no distension.     Palpations: Abdomen is soft.     Tenderness: There is no abdominal tenderness.  Musculoskeletal:     Right lower leg: No edema.     Left lower leg: No edema.  Skin:    General: Skin is warm and dry.  Neurological:     General: No focal deficit present.     Mental Status: She is alert and oriented to person, place, and time.    ED Results / Procedures / Treatments   Labs (all labs ordered are listed, but only abnormal results are displayed) Labs Reviewed  CBC WITH DIFFERENTIAL/PLATELET - Abnormal; Notable for the following components:      Result Value   RBC 2.70 (*)    Hemoglobin 6.7 (*)    HCT 24.0 (*)    MCH 24.8 (*)    MCHC 27.9 (*)    RDW 17.0 (*)    All other components within normal limits  BASIC METABOLIC PANEL - Abnormal; Notable for the following components:   Glucose, Bld 109 (*)    Creatinine, Ser 1.12 (*)    GFR, Estimated 47 (*)    All other components within normal limits  CBC  CREATININE, SERUM  POC OCCULT BLOOD, ED  TYPE AND SCREEN  PREPARE RBC (CROSSMATCH)    EKG None  Radiology No results found.  Procedures Procedures None.   Medications Ordered in ED Medications  0.9 %  sodium chloride infusion (Manually program via Guardrails IV Fluids) (has no administration in time range)  enoxaparin (LOVENOX) injection 40 mg (has no administration in time range)    ED Course  I have reviewed the triage vital signs and the nursing notes.  Pertinent labs & imaging results that were available during my care of the patient were reviewed by me and considered in my medical decision making (see chart for details).    MDM Rules/Calculators/A&P  Patient is an 86 yo female presenting from her PCP's office due to low Hb. Hb found to be 6.7 in the ED with a normal MCV. Patient states that her last nose bleed was about 3-4 weeks ago, when she was last seen in the ED. Denies melena,  hematochezia, hematuria, or bleeding from any other source. Patient has been off of her aspirin and Plavix for about 10 days. Hemoccult negative. Patient to be transfused with 1 unit PRBC with repeat H&H after. Consult placed to hospitalist group for admission and they accepted the patient.   Final Clinical Impression(s) / ED Diagnoses Final diagnoses:  None    Rx / DC Orders ED Discharge Orders     None        Jaysean Man[place], [place], DO 07/08/21 [place], DO 07/08/21 2233

## 2021-07-09 ENCOUNTER — Encounter (HOSPITAL_COMMUNITY): Payer: Self-pay | Admitting: Internal Medicine

## 2021-07-09 ENCOUNTER — Observation Stay (HOSPITAL_COMMUNITY): Payer: Medicare Other

## 2021-07-09 DIAGNOSIS — S32401A Unspecified fracture of right acetabulum, initial encounter for closed fracture: Secondary | ICD-10-CM | POA: Diagnosis not present

## 2021-07-09 DIAGNOSIS — I5043 Acute on chronic combined systolic (congestive) and diastolic (congestive) heart failure: Secondary | ICD-10-CM

## 2021-07-09 DIAGNOSIS — D649 Anemia, unspecified: Secondary | ICD-10-CM | POA: Diagnosis not present

## 2021-07-09 DIAGNOSIS — Z85048 Personal history of other malignant neoplasm of rectum, rectosigmoid junction, and anus: Secondary | ICD-10-CM | POA: Diagnosis not present

## 2021-07-09 DIAGNOSIS — Z85038 Personal history of other malignant neoplasm of large intestine: Secondary | ICD-10-CM | POA: Diagnosis not present

## 2021-07-09 DIAGNOSIS — I10 Essential (primary) hypertension: Secondary | ICD-10-CM | POA: Diagnosis not present

## 2021-07-09 DIAGNOSIS — J811 Chronic pulmonary edema: Secondary | ICD-10-CM | POA: Diagnosis not present

## 2021-07-09 LAB — CBC
HCT: 28.2 % — ABNORMAL LOW (ref 36.0–46.0)
Hemoglobin: 8.3 g/dL — ABNORMAL LOW (ref 12.0–15.0)
MCH: 25.7 pg — ABNORMAL LOW (ref 26.0–34.0)
MCHC: 29.4 g/dL — ABNORMAL LOW (ref 30.0–36.0)
MCV: 87.3 fL (ref 80.0–100.0)
Platelets: 322 10*3/uL (ref 150–400)
RBC: 3.23 MIL/uL — ABNORMAL LOW (ref 3.87–5.11)
RDW: 16.7 % — ABNORMAL HIGH (ref 11.5–15.5)
WBC: 7 10*3/uL (ref 4.0–10.5)
nRBC: 0.3 % — ABNORMAL HIGH (ref 0.0–0.2)

## 2021-07-09 LAB — BASIC METABOLIC PANEL
Anion gap: 9 (ref 5–15)
BUN: 16 mg/dL (ref 8–23)
CO2: 23 mmol/L (ref 22–32)
Calcium: 9 mg/dL (ref 8.9–10.3)
Chloride: 106 mmol/L (ref 98–111)
Creatinine, Ser: 1.1 mg/dL — ABNORMAL HIGH (ref 0.44–1.00)
GFR, Estimated: 48 mL/min — ABNORMAL LOW (ref >60)
Glucose, Bld: 124 mg/dL — ABNORMAL HIGH (ref 70–99)
Potassium: 3.8 mmol/L (ref 3.5–5.1)
Sodium: 138 mmol/L (ref 135–145)

## 2021-07-09 LAB — SARS CORONAVIRUS 2 (TAT 6-24 HRS): SARS Coronavirus 2: NEGATIVE

## 2021-07-09 LAB — BRAIN NATRIURETIC PEPTIDE: B Natriuretic Peptide: 2940.5 pg/mL — ABNORMAL HIGH (ref 0.0–100.0)

## 2021-07-09 LAB — PROCALCITONIN: Procalcitonin: <0.1 ng/mL

## 2021-07-09 MED ORDER — PANTOPRAZOLE SODIUM 40 MG PO TBEC
40.0000 mg | DELAYED_RELEASE_TABLET | Freq: Two times a day (BID) | ORAL | Status: DC
  Administered 2021-07-09 – 2021-07-10 (×2): 40 mg via ORAL
  Filled 2021-07-09 (×2): qty 1

## 2021-07-09 MED ORDER — LORAZEPAM 2 MG/ML IJ SOLN
0.5000 mg | Freq: Once | INTRAMUSCULAR | Status: AC
  Administered 2021-07-09: 0.5 mg via INTRAVENOUS
  Filled 2021-07-09: qty 1

## 2021-07-09 MED ORDER — IPRATROPIUM-ALBUTEROL 0.5-2.5 (3) MG/3ML IN SOLN
3.0000 mL | Freq: Four times a day (QID) | RESPIRATORY_TRACT | Status: DC
  Administered 2021-07-09 (×2): 3 mL via RESPIRATORY_TRACT
  Filled 2021-07-09 (×3): qty 3

## 2021-07-09 MED ORDER — LOSARTAN POTASSIUM 50 MG PO TABS
50.0000 mg | ORAL_TABLET | Freq: Every day | ORAL | Status: DC
  Administered 2021-07-09 – 2021-07-10 (×2): 50 mg via ORAL
  Filled 2021-07-09 (×2): qty 1

## 2021-07-09 MED ORDER — ROSUVASTATIN CALCIUM 20 MG PO TABS
20.0000 mg | ORAL_TABLET | Freq: Every day | ORAL | Status: DC
  Administered 2021-07-09: 20 mg via ORAL
  Filled 2021-07-09: qty 1

## 2021-07-09 MED ORDER — IPRATROPIUM-ALBUTEROL 0.5-2.5 (3) MG/3ML IN SOLN
3.0000 mL | RESPIRATORY_TRACT | Status: AC
  Administered 2021-07-09: 3 mL via RESPIRATORY_TRACT
  Filled 2021-07-09: qty 3

## 2021-07-09 MED ORDER — IOHEXOL 300 MG/ML  SOLN
100.0000 mL | Freq: Once | INTRAMUSCULAR | Status: AC | PRN
  Administered 2021-07-09: 100 mL via INTRAVENOUS

## 2021-07-09 MED ORDER — ALPRAZOLAM 0.5 MG PO TABS
1.0000 mg | ORAL_TABLET | Freq: Every day | ORAL | Status: DC | PRN
  Administered 2021-07-09: 1 mg via ORAL
  Filled 2021-07-09: qty 4

## 2021-07-09 MED ORDER — GUAIFENESIN-DM 100-10 MG/5ML PO SYRP
5.0000 mL | ORAL_SOLUTION | ORAL | Status: DC | PRN
  Administered 2021-07-09: 5 mL via ORAL
  Filled 2021-07-09 (×2): qty 5

## 2021-07-09 MED ORDER — BUSPIRONE HCL 5 MG PO TABS
5.0000 mg | ORAL_TABLET | Freq: Three times a day (TID) | ORAL | Status: DC
  Administered 2021-07-09 – 2021-07-10 (×3): 5 mg via ORAL
  Filled 2021-07-09 (×5): qty 1

## 2021-07-09 MED ORDER — FUROSEMIDE 40 MG PO TABS: 40.0000 mg | ORAL_TABLET | Freq: Every day | ORAL | Status: DC

## 2021-07-09 MED ORDER — IPRATROPIUM-ALBUTEROL 0.5-2.5 (3) MG/3ML IN SOLN: 3.0000 mL | Freq: Four times a day (QID) | RESPIRATORY_TRACT | Status: DC | PRN

## 2021-07-09 MED ORDER — FUROSEMIDE 10 MG/ML IJ SOLN
40.0000 mg | Freq: Once | INTRAMUSCULAR | Status: AC
  Administered 2021-07-09: 40 mg via INTRAVENOUS
  Filled 2021-07-09: qty 4

## 2021-07-09 NOTE — Consult Note (Addendum)
Cardiology Consultation:   Patient ID: Cindy [name] MRN: [id number]; DOB: 1930/08/11  Admit date: 07/08/2021 Date of Consult: 07/09/2021  PCP:  Cindy Cuff, MD   [facility] Cardiologist:  Cindy Martinique, MD   {  Patient Profile:   Cindy [name] is a 87 y.o. female with a history of chronic combined CHF with EF of 30-35% on Echo in 04/2021, prior PE/DVT no longer on anticoagulation, hypertension, hyperlipidemia, hypothyroidism, GERD, depression, and advanced dementia who is being seen 07/09/2021 for the evaluation of CHF at the request of Dr. Alfredia [name].  History of Present Illness:   Cindy [name] is a 87 year old female with the above history who is followed by Dr. Martinique. She was admitted in 07/2020 with acute bilateral Pes and DVT and was started on Eliquis. Eliquis was stopped after 6 months. Echo that admission showed LVEF of 30-35% with grade 2 diastolic dysfunction. RV was normal but PA pressures moderately elevated. Etiology of cardiomyopathy unclear but was felt to possibly be ischemia vs stress mediated. She was started on GDMT with Coreg and Losartan at follow-up visit for conservative management as she was felt to be a poor candidate for invasive evaluation.   She was admitted in 04/2021 with NSTEMI after presenting with panic attack associated with shortness of breath and chest pain. High-sensitivity troponin peaked at 1,584. EKG showed T wave inversion in lead I and aVL (unchanged from prior tracings. Echo showed LVEF of 30-35% with severe hypokinesis of anterior/septal/apical walls and grade 2 diastolic dysfunction. Cardiology was consulted and patient was treated conservatively with IV Heparin for 48 hours. Then started on DAPT with Aspirin and Plavix with plans to continue for 1 year. Coreg and Losartan were continued. Patient was supposed to follow-up with Cardiology after this admission but this unfortunately did not happen. Recently Plavix was stopped due to  recurrent epistaxis.   Patient was sent by PCP to  ED on 07/08/2021 for acute anemia with hemoglobin <7. Patient has mostly been in her usual state of health. She has had some orthopnea for the last 9 months, especially over the last couple of months. She states when laying flat she gets anxious and feels like she cannot breathe. Her son states when laying flat she will start coughing and then get anxious and her breathing worsens. No exertional shortness of breath, PND, or lower extremity edema. No chest pain. No palpitations, lightheadedness, dizziness, or syncope. Since being started on DAPT in 04/2021, patient has had recurrent epistaxis requiring multiple visits to the ED. Plavix  was stopped on 06/14/2021. Patient states last nosebleed was about 2 weeks ago. She went to her PCPs office yesterday for routine visit. Routine labs were checked and hemoglobin was <7.0 so patient was advised to come immediately to the ED. However, son states patient has really been feeling fine.   In the ED, patient mildly tachycardic and hypertensive but vitals stable. WBC 4.4, Hgb 6.7, Plts 345. Na 136, K 3.6, Glucose 109, BUN 16, Cr 1.12. BNP markedly elevated at 2,940. She was given 1 unit of PRBCs in the ED and admitted for further evaluation. GI consulted. While in the ED, patient started to have some wheezing/shortness of breath. Chest x-ray showed mild interstitial infiltrate (progressive since prior imaging) likely reflecting mild interstitial pulmonary edema. Cardiology consulted for further evaluation of CHF.  At the time of this evaluation, patient resting comfortably in no acute distress. She has had good urine output after one dose of IV Lasix  and feels like she is breathing a little better.  Of note patient fractured her right hip about 1 year ago. She was felt to be too high risk for surgery. She has chronic hip pain with this. Unable to put >50% of weight on right leg due to this and gets around in a  wheelchair.  Past Medical History:  Diagnosis Date   Arthritis    Cancer ([place])    skin   Colon cancer ([place]) 08/31/13   invasive squamous cell   Deaf, left    Depression    GERD (gastroesophageal reflux disease)    History of radiation therapy 10/10/13-11/23/13   anal canal 54GY/   Hyperlipemia    Hypertension    Hypothyroidism    PONV (postoperative nausea and vomiting)    Skin cancer    Shingles 2012 Bad case    Past Surgical History:  Procedure Laterality Date   ABDOMINAL HYSTERECTOMY     APPENDECTOMY     CESAREAN SECTION     2   CHOLECYSTECTOMY     COLONOSCOPY  08/31/13   invasive squamous cell colon   ERCP     ESOPHAGOGASTRODUODENOSCOPY  10/20/2012   Procedure: ESOPHAGOGASTRODUODENOSCOPY (EGD);  Surgeon: Arta Silence, MD;  Location: [facility];  Service: Endoscopy;  Laterality: Left;   EYE SURGERY     cataracts   KNEE ARTHROSCOPY  05/11/2012   Procedure: ARTHROSCOPY KNEE;  Surgeon: Hessie Dibble, MD;  Location: [facility];  Service: Orthopedics;  Laterality: Right;  left knee medial menisectomy and chondroplasty   THYROIDECTOMY, PARTIAL     TONSILLECTOMY       Home Medications:  Prior to Admission medications   Medication Sig Start Date End Date Taking? Authorizing Provider  acetaminophen (TYLENOL) 500 MG tablet Take 1,000 mg by mouth every 4 (four) hours as needed for mild pain, headache or fever.   Yes [provider]  ALPRAZolam Duanne Moron) 1 MG tablet Take 1 mg by mouth daily as needed for anxiety. 06/10/21  Yes [provider]  aspirin EC 81 MG EC tablet Take 1 tablet (81 mg total) by mouth daily. Swallow whole. 05/04/21  Yes [place] Eric J, DO  buPROPion (WELLBUTRIN SR) 200 MG 12 hr tablet Take 200 mg by mouth daily.    Yes [provider]  busPIRone (BUSPAR) 5 MG tablet Take 5 mg by mouth 3 (three) times daily.   Yes [provider]  carvedilol (COREG) 6.25 MG tablet Take 1 tablet (6.25 mg total) by mouth 2  (two) times daily. 09/05/20  Yes [name], Cindy M, MD  docusate sodium (COLACE) 100 MG capsule Take 100 mg by mouth 2 (two) times daily.   Yes [provider]  escitalopram (LEXAPRO) 20 MG tablet Take 20 mg by mouth daily.   Yes [provider]  gabapentin (NEURONTIN) 300 MG capsule Take 300 mg by mouth at bedtime.   Yes [provider]  levothyroxine (SYNTHROID) 100 MCG tablet Take 100 mcg by mouth daily.   Yes [provider]  loperamide (IMODIUM) 2 MG capsule Take 2 mg by mouth 4 (four) times daily as needed for diarrhea or loose stools.   Yes [provider]  losartan (COZAAR) 50 MG tablet Take 50 mg by mouth daily. 05/13/21  Yes [provider]  ondansetron (ZOFRAN) 4 MG tablet Take 4 mg by mouth every 8 (eight) hours as needed for nausea/vomiting. 10/04/20  Yes [provider]  pantoprazole (PROTONIX) 40 MG tablet Take  40 mg by mouth daily.   Yes [provider]  polyethylene glycol (MIRALAX / GLYCOLAX) 17 g packet Take 17 g by mouth See admin instructions. 17g every other day as needed for constipation   Yes [provider]  clopidogrel (PLAVIX) 75 MG tablet Take 1 tablet (75 mg total) by mouth daily. Patient not taking: Reported on 07/09/2021 05/04/21 05/04/22  [place] Donnamarie Poag, DO  escitalopram (LEXAPRO) 10 MG tablet Take 2 tablets (20 mg total) by mouth daily. Patient not taking: No sig reported 05/04/21 06/03/21  [place] Donnamarie Poag, DO  losartan (COZAAR) 25 MG tablet Take 0.5 tablets (12.5 mg total) by mouth daily. Patient not taking: Reported on 07/09/2021 05/04/21 08/02/21  [place] Eric J, DO    Inpatient Medications: Scheduled Meds:  aspirin EC  81 mg Oral Daily   buPROPion  200 mg Oral Daily   carvedilol  6.25 mg Oral BID WC   clopidogrel  75 mg Oral Daily   enoxaparin (LOVENOX) injection  40 mg Subcutaneous QHS   escitalopram  20 mg Oral Daily   ipratropium-albuterol  3 mL Nebulization Q6H   levothyroxine   100 mcg Oral Daily   pantoprazole  40 mg Oral BID   Continuous Infusions:  PRN Meds: acetaminophen, gabapentin, guaiFENesin-dextromethorphan, melatonin, metoprolol tartrate, ondansetron (ZOFRAN) IV, polyethylene glycol  Allergies:    Allergies  Allergen Reactions   Vancomycin Hives    Zosyn and vanc given together; unclear which was precipitating med. Appears to have tolerated cephalosporins in the past.   Zosyn [Piperacillin Sod-Tazobactam So] Hives    Zosyn and vanc given together; unclear which was precipitating med. Appears to have tolerated cephalosporins in the past.   Hydrocodone Nausea Only   Morphine And Related Hives    Social History:   Social History   Socioeconomic History   Marital status: Widowed    Spouse name: Not on file   Number of children: Not on file   Years of education: Not on file   Highest education level: Not on file  Occupational History   Not on file  Tobacco Use   Smoking status: Never   Smokeless tobacco: Never  Substance and Sexual Activity   Alcohol use: No   Drug use: No   Sexual activity: Never  Other Topics Concern   Not on file  Social History Narrative   Not on file   Social Determinants of Health   Financial Resource Strain: Not on file  Food Insecurity: Not on file  Transportation Needs: Not on file  Physical Activity: Not on file  Stress: Not on file  Social Connections: Not on file  Intimate Partner Violence: Not on file    Family History:    Family History  Problem Relation Age of Onset   Kidney disease Mother    Heart attack Father    Heart disease Father    Cancer Sister        breast   Cancer Sister        ovarian   Cancer Sister        melanoma   Cancer Daughter        breast     ROS:  Please see the history of present illness.  All other ROS reviewed and negative.     Physical Exam/Data:   Vitals:   07/09/21 0945 07/09/21 1000 07/09/21 1106 07/09/21 1205  BP:   (!) 147/103 (!) 149/76  Pulse:  95 95 94 87  Resp: 15 17 (!)  28 14  Temp:    98.6 F (37 C)  TempSrc:    Oral  SpO2: 90% 91% 100% 99%  Weight:      Height:        Intake/Output Summary (Last 24 hours) at 07/09/2021 1345 Last data filed at 07/09/2021 0441 Gross per 24 hour  Intake 484 ml  Output --  Net 484 ml   Last 3 Weights 07/08/2021 06/10/2021 05/08/2021  Weight (lbs) 140 lb 145 lb 136 lb 11 oz  Weight (kg) 63.504 kg 65.772 kg 62 kg     Body mass index is 22.6 kg/[name].  General: 87 y.o. female resting comfortably in no acute distress. HEENT: Normocephalic and atraumatic. Sclera clear.  Neck: Supple. No JVD. Heart: RRR. No murmurs, gallops, or rubs. Radial and distal pedal pulses 2+ and equal bilaterally. Lungs: No increased work of breathing. Faint crackles in bases, mainly right but otherwise lungs clear to ausculation bilaterally. No wheezing. Abdomen: Soft, non-distended, and non-tender to palpation.  Extremities: No lower extremity edema. Atrophy of right leg. Right leg shorter than left. Skin: Warm and dry. Neuro: Alert and oriented x3. No focal deficits. Psych: Normal affect. Responds appropriately.  EKG:  The EKG was personally reviewed and demonstrates: No EKG obtained this admission.  Telemetry:  Telemetry was personally reviewed and demonstrates:  Normal sinus rhythm with rates in the 70s to 80s.  Relevant CV Studies:  Echocardiogram 05/01/2021: Impressions: 1. Severe hypokinesis of the anterior, septal and apical walls; overall  moderate to severe LV dysfunction.   2. Left ventricular ejection fraction, by estimation, is 30 to 35%. The  left ventricle has moderate to severely decreased function. The left  ventricle demonstrates regional wall motion abnormalities (see scoring  diagram/findings for description). Left  ventricular diastolic parameters are consistent with Grade II diastolic  dysfunction (pseudonormalization). Elevated left atrial pressure.   3. Right ventricular systolic function  is normal. The right ventricular  size is normal. There is severely elevated pulmonary artery systolic  pressure.   4. Left atrial size was moderately dilated.   5. The mitral valve is abnormal. Mild mitral valve regurgitation. No  evidence of mitral stenosis.   6. The aortic valve is tricuspid. Aortic valve regurgitation is trivial.  Mild aortic valve sclerosis is present, with no evidence of aortic valve  stenosis.   7. The inferior vena cava is normal in size with greater than 50%  respiratory variability, suggesting right atrial pressure of 3 mmHg.   Laboratory Data:  High Sensitivity Troponin:  No results for input(s): TROPONINIHS in the last 720 hours.   Chemistry Recent Labs  Lab 07/08/21 1752 07/09/21 0500  NA 136 138  K 3.6 3.8  CL 103 106  CO2 24 23  GLUCOSE 109* 124*  BUN 16 16  CREATININE 1.12* 1.10*  CALCIUM 9.2 9.0  GFRNONAA 47* 48*  ANIONGAP 9 9    No results for input(s): PROT, ALBUMIN, AST, ALT, ALKPHOS, BILITOT in the last 168 hours. Hematology Recent Labs  Lab 07/08/21 1752 07/09/21 0500  WBC 4.4 7.0  RBC 2.70* 3.23*  HGB 6.7* 8.3*  HCT 24.0* 28.2*  MCV 88.9 87.3  MCH 24.8* 25.7*  MCHC 27.9* 29.4*  RDW 17.0* 16.7*  PLT 345 322   BNP Recent Labs  Lab 07/09/21 0500  BNP 2,940.5*    DDimer No results for input(s): DDIMER in the last 168 hours.   Radiology/Studies:  CT ABDOMEN PELVIS W CONTRAST  Result Date: 07/09/2021 CLINICAL DATA:  Anemia, history of colon cancer, GERD, hypertension EXAM: CT ABDOMEN AND PELVIS WITH CONTRAST TECHNIQUE: Multidetector CT imaging of the abdomen and pelvis was performed using the standard protocol following bolus administration of intravenous contrast. Sagittal and coronal MPR images reconstructed from axial data set. CONTRAST:  121mL OMNIPAQUE IOHEXOL 300 MG/ML SOLN IV. No oral contrast. COMPARISON:  04/29/2021 FINDINGS: Lower chest: Small BILATERAL pleural effusions RIGHT greater than LEFT with minimal  bibasilar atelectasis. Hepatobiliary: Post cholecystectomy. Question 5 mm hypervascular lesion lateral segment LEFT lobe liver image 16. Remainder of liver unremarkable. No biliary dilatation. Pancreas: Atrophic, otherwise unremarkable Spleen: Normal appearance Adrenals/Urinary Tract: Adrenal glands normal appearance. Mildly dilated renal collecting systems and ureters bilaterally, question due to distended bladder, no mass or calculi visualized. Tiny inferior pole RIGHT renal cyst. Kidneys otherwise unremarkable. Bladder normal appearance. Stomach/Bowel: Appendix not visualized. Mild sigmoid diverticulosis without evidence of diverticulitis. Stomach and bowel loops otherwise normal appearance. Vascular/Lymphatic: Atherosclerotic calcifications aorta without aneurysm. No adenopathy. Scattered pelvic phleboliths. Reproductive: Uterus surgically absent. Nonvisualization of ovaries. Other: No free air or free fluid. No hernia or inflammatory process. Musculoskeletal: Osseous demineralization. Chronic fractures of the anterior and posterior columns of the RIGHT acetabulum with advanced RIGHT hip degenerative changes and femoral head deformity. IMPRESSION: Mild sigmoid diverticulosis without evidence of diverticulitis. Mildly dilated renal collecting systems and ureters bilaterally, question due to distended bladder. Small BILATERAL pleural effusions RIGHT greater than LEFT with minimal bibasilar atelectasis. Question 5 mm hypervascular lesion lateral segment LEFT lobe liver, nonspecific, can assess by follow-up MR imaging with and without contrast. Chronic fractures of the anterior and posterior columns of the RIGHT acetabulum with advanced RIGHT hip degenerative changes/deformity. Aortic Atherosclerosis (ICD10-I70.0). Electronically Signed   By: Lavonia Dana [name].D.   On: 07/09/2021 13:19   DG CHEST PORT 1 VIEW  Result Date: 07/09/2021 CLINICAL DATA:  05/01/2021 EXAM: PORTABLE CHEST 1 VIEW COMPARISON:  None. FINDINGS:  The lungs are symmetrically expanded. Mild progressive cardiomegaly is likely artifactual and related to supine positioning on the current examination. There is progressive perihilar interstitial pulmonary infiltrate which, again, may relate to supine positioning but may also represent progressive interstitial pulmonary edema, asymmetrically more severe within the left lung. No pneumothorax or pleural effusion. IMPRESSION: Mild interstitial infiltrate, progressive since prior examination, likely reflecting mild interstitial pulmonary edema. Electronically Signed   By: Fidela Salisbury MD   On: 07/09/2021 00:53     Assessment and Plan:   Acute on Chronic Combined CHF - Patient presented with acute symptomatic anemia with hemglobin of 6.7.  - BNP markedly elevated at 2,940.  - Chest x-ray showed mild interstitial infiltrate (progressive since prior imaging) likely reflecting mild interstitial pulmonary edema. - Echo in 04/2021 showed 30-35% with severe hypokinesis of anterior/septal/apical walls and grade 2 diastolic dysfunction.  - Received one dose of IV Lasix 40mg  in the ED with good urine output. Documented urinary output 1.5 L so far.  - Patient does not appear volume overloaded on exam.  - Will start PO Lasix 40mg  daily tomorrow. However, if patient requires another unit of blood, would recommend another dose of IV Lasix 20mg . - Continue Coreg 6.25mg  twice daily.  - Will restart home Losartan 50mg  daily. - Monitor daily weights, strict I/O's, and renal function.   CAD with Recent NSTEMI - Patient admitted in 04/2021 with NSTEMI. Given age and advanced dementia, patient was treated conservatively with 48 hours of IV Heparin.  - No chest pain.  - Plavix recently stopped due to recurrent epistaxis.  Aspirin was continued on admission. Agree with continuing this if OK with GI. - Continue beta-blocker.  - Has not been on statin. Will start Crestor 20mg  daily.  Hypertension - BP elevated. -  Continue home Coreg 6.25mg  twice daily. - Restart home Losartan 50mg  daily.  Hyperlipidemia - History of hyperlipidemia in chart but not on a statin.  - Given recent NSTEMI, will start Crestor 20mg  daily. - Will check lipid panel in the morning.  History of PE/DVT - Diagnosed in 07/2020. Completed 6 months of Eliquis.  Acute Anemia - Hemoglobin 6.7 on admission. S/p 1 unit of PRBC. Repeat hemoglobin 8.3. - Hemoccult pending.  - Iron panel, Folate, and B12 pending.  - GI consulted and following.   Risk Assessment/Risk Scores:   New York Heart Association (NYHA) Functional Class NYHA Class II   For questions or updates, please contact [facility] Please consult [email or web address] for contact info under    Signed, Darreld Mclean, PA-C  07/09/2021 1:45 PM

## 2021-07-09 NOTE — Progress Notes (Signed)
PROGRESS NOTE    Cindy [name]  WPY:[id number] DOB: 11-May-1930 DOA: 07/08/2021 PCP: Clovia Cuff, MD  Brief Narrative: The patient is an 87 year old elderly Caucasian female with a past medical history significant for but not limited to anal cancer, history anemia chronic disease, essential hypertension, polyneuropathy, hypothyroidism, chronic anxiety depression, GERD as well as other comorbidities who was sent from the PCPs office due to low hemoglobin.  She occasionally endorses shortness of breath and states that she has been having wheezing at night.  She had a recent nosebleed for which she was seen in the ED about a month ago while she was on aspirin and Plavix.  She has been off of dual antiplatelet therapy for the last 10 days without recurrence of epistaxis.  When she came to the ED further work-up revealed that she had a hemoglobin of 6.7.  She is typed and screened and she is 1 unit of PRBCs.  Hospitalist team was asked admit and GI was consulted.  While in the ED she developed an acute onset of coughing spell and was given duo nebs.  Chest x-ray was done and revealed mild interstitial infiltrates versus pulmonary edema.  Upon checking her BNP it was over 2700.  She was given IV Lasix.  GI and cardiology both following.   Assessment & Plan:   Active Problems:   Acute on chronic combined systolic (congestive) and diastolic (congestive) heart failure (HCC)   Symptomatic anemia  Symptomatic anemia/acute anemia in the setting of epistaxis -She is being followed by hematology oncology in the past and since to the ED from her PCP office given her low hemoglobin -In the ED her hemoglobin was 6.7 and she is typed and screened transfuse 1 unit of PRBCs  -FOBT was negative -Repeat CBC posttransfusion was 8.3/28.2 -GI consulted and her last EGD was in 2013 which showed mild bulbar duodenitis -She was made empirically n.p.o. after midnight and GI was made aware for admission and they  evaluated and they are obtaining a CT scan to rule out any recurrent malignancy given her history of squamous cell carcinoma of the anus and recommending supportive care for now continue Protonix -Checking anemia panel however this was not done prior to her blood transfusion so this can be essentially useless -She will need PT/OT to evaluate and Treat  Acute on chronic combined systolic and diastolic CHF -Likely exacerbated in the setting of her low hemoglobin -Strict I's and O's and daily weights -Recent echo done showed severe hypokinesis of the anterior septal and apical walls overall moderate to severe left ventricular dysfunction with L left ventricular ejection fraction of 30 to 35% and grade 2 diastolic dysfunction -BNP on admission 2940.5 -Given a dose of IV Lasix 40 mg in the ED and diuresed well -Cardiology consulted for further evaluation and recommendations -Currently does not appear to be extremely volume overloaded exam  -Cardiology recommending p.o. Lasix 40 mg daily starting tomorrow however she required another unit of blood given another dose of IV Lasix -We will continue her home medications including carvedilol 6.25 mg p.o. twice daily as well as losartan 50 mg daily  Cough, acute onset -Coughing spell while in the ED along with wheezing yesterday; In the setting of Above -CXR with Mild pulmonary infiltrates edema, COVID-19 screening test NEGATIVE. -BNP was 2940.5 -DuoNebs scheduled every 6 hours and as needed antitussives ordered -SpO2: 98 % O2 Flow Rate (L/min): 2 L/min -Continue to monitor respiratory status carefully and will need a home O2 prior  to discharge   Essential hypertension -BP is not at goal, elevated -Resume home oral antihypertensives and she feels will be placed back on carvedilol 6.25 mg p.o. twice daily as well as losartan 50 mg daily; received a dose of IV Lasix -IV antihypertensives as needed with parameters   Hypothyroidism -Resume home  levothyroxine 100 mcg and check TSH in a.m.   Chronic anxiety/depression -Resume home escitalopram 20 mg p.o. daily, bupropion 200 mg p.o. daily, and BuSpar 5 mg p.o. 3 times daily -She also has alprazolam 1 mg p.o. daily as needed anxiety   GERD -Resume home PPI with pantoprazole 40 g p.o. twice daily   Polyneuropathy -Resume home gabapentin   History of anal cancer -Follows with hematology oncology and radiation oncology -GI is obtaining a repeat CT scan to rule out malignancy    CAD with recent NSTEMI -She was recently admitted back in May with an NSTEMI and given her advanced age and dementia she was treated conservatively with IV heparin for 48 hours -Currently she denies any chest pain -Recently her Plavix was held due to epistaxis and aspirin was continued on admission and will need to defer to GI to see if this can be resumed -Cardiology's been consulted and they resume her beta-blocker and started her on rosuvastatin 20 g daily  Hyperlipidemia  -given her recent NSTEMI cardiology is going to to start her on a rosuvastatin 20 mg daily and check lipid panel in a.m.  History of PE/DVT -Was diagnosed back in August 2021 and she completed 6 months of Eliquis  History of right-sided hip fracture that is nonoperative and patient is wheelchair-bound -Right leg is more atrophied than the left leg  DVT prophylaxis: SCDs given her Code Status: DO NOT RESUSCITATE Family Communication: Discussed with the patient's son at bedside Disposition Plan: Pending further clinical improvement and clearance by specialists including GI and cardiology  Status is: Observation  The patient will require care spanning > 2 midnights and should be moved to inpatient because: Unsafe d/c plan, IV treatments appropriate due to intensity of illness or inability to take PO, and Inpatient level of care appropriate due to severity of illness  Dispo:  Patient From: Home  Planned Disposition:  Home  Medically stable for discharge: No      Consultants:  Cardiology Gastroenterology  Procedures:  None  Antimicrobials:  Anti-infectives (From admission, onward)    None        Subjective: Seen and examined at bedside and she was doing okay.  States that she became short of breath last night and was wheezing and started coughing.  Denies any nausea or vomiting but feels weak.  No other concerns or complaints at this time and has not had any further bleeding or epistaxis.  Denies any bloody stools or blood in her urine.  Objective: Vitals:   07/09/21 1106 07/09/21 1205 07/09/21 1609 07/09/21 1717  BP: (!) 147/103 (!) 149/76 131/68 (!) 122/58  Pulse: 94 87 76 64  Resp: (!) 28 14 (!) 26 (!) 22  Temp:  98.6 F (37 C)  98.1 F (36.7 C)  TempSrc:  Oral  Oral  SpO2: 100% 99% 100% 98%  Weight:      Height:        Intake/Output Summary (Last 24 hours) at 07/09/2021 1804 Last data filed at 07/09/2021 1632 Gross per 24 hour  Intake 484 ml  Output 2200 ml  Net -1716 ml   Filed Weights   07/08/21 1743  Weight:  63.5 kg   Examination: Physical Exam:  Constitutional: Thin elderly Caucasian female currently in no acute distress appears calm Eyes: Lids and conjunctivae normal, sclerae anicteric  ENMT: External Ears, Nose appear normal. Grossly normal hearing.  Neck: Appears normal, supple, no cervical masses, normal ROM, no appreciable thyromegaly; no JVD Respiratory: Diminished to auscultation bilaterally with coarse breath sounds, no wheezing, rales, rhonchi or crackles. Normal respiratory effort and patient is not tachypenic. No accessory muscle use.  Unlabored breathing Cardiovascular: RRR, no murmurs / rubs / gallops. S1 and S2 auscultated.  No appreciable lower extremity edema Abdomen: Soft, non-tender, non-distended. Bowel sounds positive.  GU: Deferred. Musculoskeletal: No clubbing / cyanosis of digits/nails.  Right leg is extremely atrophied compared to her left  leg in the setting of her chronic hip fracture  Skin: No rashes, lesions, ulcers on limited skin evaluation. No induration; Warm and dry.  Neurologic: CN 2-12 grossly intact with no focal deficits. Romberg sign and cerebellar reflexes not assessed.  Psychiatric: Normal judgment and insight. Alert and oriented x 3. Normal mood and appropriate affect.   Data Reviewed: I have personally reviewed following labs and imaging studies  CBC: Recent Labs  Lab 07/08/21 1752 07/09/21 0500  WBC 4.4 7.0  NEUTROABS 2.7  --   HGB 6.7* 8.3*  HCT 24.0* 28.2*  MCV 88.9 87.3  PLT 345 540   Basic Metabolic Panel: Recent Labs  Lab 07/08/21 1752 07/09/21 0500  NA 136 138  K 3.6 3.8  CL 103 106  CO2 24 23  GLUCOSE 109* 124*  BUN 16 16  CREATININE 1.12* 1.10*  CALCIUM 9.2 9.0   GFR: Estimated Creatinine Clearance: 32.5 mL/min (A) (by C-G formula based on SCr of 1.1 mg/dL (H)). Liver Function Tests: No results for input(s): AST, ALT, ALKPHOS, BILITOT, PROT, ALBUMIN in the last 168 hours. No results for input(s): LIPASE, AMYLASE in the last 168 hours. No results for input(s): AMMONIA in the last 168 hours. Coagulation Profile: No results for input(s): INR, PROTIME in the last 168 hours. Cardiac Enzymes: No results for input(s): CKTOTAL, CKMB, CKMBINDEX, TROPONINI in the last 168 hours. BNP (last 3 results) No results for input(s): PROBNP in the last 8760 hours. HbA1C: No results for input(s): HGBA1C in the last 72 hours. CBG: No results for input(s): GLUCAP in the last 168 hours. Lipid Profile: No results for input(s): CHOL, HDL, LDLCALC, TRIG, CHOLHDL, LDLDIRECT in the last 72 hours. Thyroid Function Tests: No results for input(s): TSH, T4TOTAL, FREET4, T3FREE, THYROIDAB in the last 72 hours. Anemia Panel: No results for input(s): VITAMINB12, FOLATE, FERRITIN, TIBC, IRON, RETICCTPCT in the last 72 hours. Sepsis Labs: Recent Labs  Lab 07/09/21 0500  PROCALCITON <0.10    Recent  Results (from the past 240 hour(s))  SARS CORONAVIRUS 2 (TAT 6-24 HRS) Nasopharyngeal Nasopharyngeal Swab     Status: None   Collection Time: 07/08/21 11:24 PM   Specimen: Nasopharyngeal Swab  Result Value Ref Range Status   SARS Coronavirus 2 NEGATIVE NEGATIVE Final    Comment: (NOTE) SARS-CoV-2 target nucleic acids are NOT DETECTED.  The SARS-CoV-2 RNA is generally detectable in upper and lower respiratory specimens during the acute phase of infection. Negative results do not preclude SARS-CoV-2 infection, do not rule out co-infections with other pathogens, and should not be used as the sole basis for treatment or other patient management decisions. Negative results must be combined with clinical observations, patient history, and epidemiological information. The expected result is Negative.  Fact Sheet for  Patients: SugarRoll.be  Fact Sheet for Healthcare Providers: https://www.woods-mathews.com/  This test is not yet approved or cleared by the [place] FDA and  has been authorized for detection and/or diagnosis of SARS-CoV-2 by FDA under an Emergency Use Authorization (EUA). This EUA will remain  in effect (meaning this test can be used) for the duration of the COVID-19 declaration under Se ction 564(b)(1) of the Act, 21 U.S.C. section 360bbb-3(b)(1), unless the authorization is terminated or revoked sooner.  Performed at [facility], [place] [address], [place], [place] [address]      RN Pressure Injury Documentation:     Estimated body mass index is 22.6 kg/m as calculated from the following:   Height as of this encounter: 5\' 6"  (1.676 m).   Weight as of this encounter: 63.5 kg.  Malnutrition Type:   Malnutrition Characteristics:   Nutrition Interventions:    Radiology Studies: CT ABDOMEN PELVIS W CONTRAST  Result Date: 07/09/2021 CLINICAL DATA:  Anemia, history of colon cancer, GERD, hypertension EXAM: CT  ABDOMEN AND PELVIS WITH CONTRAST TECHNIQUE: Multidetector CT imaging of the abdomen and pelvis was performed using the standard protocol following bolus administration of intravenous contrast. Sagittal and coronal MPR images reconstructed from axial data set. CONTRAST:  173mL OMNIPAQUE IOHEXOL 300 MG/ML SOLN IV. No oral contrast. COMPARISON:  04/29/2021 FINDINGS: Lower chest: Small BILATERAL pleural effusions RIGHT greater than LEFT with minimal bibasilar atelectasis. Hepatobiliary: Post cholecystectomy. Question 5 mm hypervascular lesion lateral segment LEFT lobe liver image 16. Remainder of liver unremarkable. No biliary dilatation. Pancreas: Atrophic, otherwise unremarkable Spleen: Normal appearance Adrenals/Urinary Tract: Adrenal glands normal appearance. Mildly dilated renal collecting systems and ureters bilaterally, question due to distended bladder, no mass or calculi visualized. Tiny inferior pole RIGHT renal cyst. Kidneys otherwise unremarkable. Bladder normal appearance. Stomach/Bowel: Appendix not visualized. Mild sigmoid diverticulosis without evidence of diverticulitis. Stomach and bowel loops otherwise normal appearance. Vascular/Lymphatic: Atherosclerotic calcifications aorta without aneurysm. No adenopathy. Scattered pelvic phleboliths. Reproductive: Uterus surgically absent. Nonvisualization of ovaries. Other: No free air or free fluid. No hernia or inflammatory process. Musculoskeletal: Osseous demineralization. Chronic fractures of the anterior and posterior columns of the RIGHT acetabulum with advanced RIGHT hip degenerative changes and femoral head deformity. IMPRESSION: Mild sigmoid diverticulosis without evidence of diverticulitis. Mildly dilated renal collecting systems and ureters bilaterally, question due to distended bladder. Small BILATERAL pleural effusions RIGHT greater than LEFT with minimal bibasilar atelectasis. Question 5 mm hypervascular lesion lateral segment LEFT lobe liver,  nonspecific, can assess by follow-up MR imaging with and without contrast. Chronic fractures of the anterior and posterior columns of the RIGHT acetabulum with advanced RIGHT hip degenerative changes/deformity. Aortic Atherosclerosis (ICD10-I70.0). Electronically Signed   By: Lavonia Dana M.D.   On: 07/09/2021 13:19   DG CHEST PORT 1 VIEW  Result Date: 07/09/2021 CLINICAL DATA:  05/01/2021 EXAM: PORTABLE CHEST 1 VIEW COMPARISON:  None. FINDINGS: The lungs are symmetrically expanded. Mild progressive cardiomegaly is likely artifactual and related to supine positioning on the current examination. There is progressive perihilar interstitial pulmonary infiltrate which, again, may relate to supine positioning but may also represent progressive interstitial pulmonary edema, asymmetrically more severe within the left lung. No pneumothorax or pleural effusion. IMPRESSION: Mild interstitial infiltrate, progressive since prior examination, likely reflecting mild interstitial pulmonary edema. Electronically Signed   By: Fidela Salisbury MD   On: 07/09/2021 00:53    Scheduled Meds:  aspirin EC  81 mg Oral Daily   buPROPion  200 mg Oral Daily  busPIRone  5 mg Oral TID   carvedilol  6.25 mg Oral BID WC   enoxaparin (LOVENOX) injection  40 mg Subcutaneous QHS   escitalopram  20 mg Oral Daily   [START ON 07/10/2021] furosemide  40 mg Oral Daily   ipratropium-albuterol  3 mL Nebulization Q6H   levothyroxine  100 mcg Oral Daily   losartan  50 mg Oral Daily   pantoprazole  40 mg Oral BID   rosuvastatin  20 mg Oral Daily   Continuous Infusions:   LOS: 0 days   Kerney Elbe, DO [facility] PAGER is on AMION  If 7PM-7AM, please contact night-coverage [email or web address]

## 2021-07-09 NOTE — ED Notes (Signed)
Notified provider about pt coughing.

## 2021-07-09 NOTE — ED Notes (Signed)
Patient transported to CT 

## 2021-07-09 NOTE — ED Notes (Signed)
Pt stated she is pain and usually take medication for her hip. I will administer her PRN gabapentin.

## 2021-07-09 NOTE — ED Notes (Signed)
Notified provider about the pt behavior changing from 21:30. The pt was was not coughing and upbeat. Currently she is coughing with audible rattle. She is anxious. Son is at the bedside.

## 2021-07-09 NOTE — ED Notes (Signed)
Pt states she is no longer nauseous.

## 2021-07-09 NOTE — Consult Note (Signed)
Referring Provider: ED Primary Care Physician:  Clovia Cuff, MD Primary Gastroenterologist:  Dr. Paulita Fujita [facility])  Reason for Consultation:  Anemia  HPI: Cindy [name] is a 87 y.o. female with past medical history of invasive anorectal squamous cell carcinoma (2014), chronic anemia, CHF, PE/DVT presenting for consultation of anemia.  Patient states she presented to the ED after she was found to be anemic on outside labs. Upon presentation yesterday, hemoglobin was 6.7.  She reports recent worsening shortness of breath, particularly when she lies down.  She denies chest pain, dizziness, syncope.  She denies any abdominal pain, melena or hematochezia.  However, she has had intermittent nosebleeds, of substantial amount, and her Plavix was stopped several weeks ago as a result.  Denies abdominal pain, constipation, diarrhea.  Reports nausea last week with nonbloody emesis, which has resolved.  Denies any changes in appetite, unexplained weight loss, dysphagia.  Takes 81 mg aspirin.  No other known NSAID use.  Past history of invasive anorectal squamous cell carcinoma diagnosed in 2014.  Past Medical History:  Diagnosis Date   Arthritis    Cancer ([place])    skin   Colon cancer ([place]) 08/31/13   invasive squamous cell   Deaf, left    Depression    GERD (gastroesophageal reflux disease)    History of radiation therapy 10/10/13-11/23/13   anal canal 54GY/   Hyperlipemia    Hypertension    Hypothyroidism    PONV (postoperative nausea and vomiting)    Skin cancer    Shingles 2012 Bad case    Past Surgical History:  Procedure Laterality Date   ABDOMINAL HYSTERECTOMY     APPENDECTOMY     CESAREAN SECTION     2   CHOLECYSTECTOMY     COLONOSCOPY  08/31/13   invasive squamous cell colon   ERCP     ESOPHAGOGASTRODUODENOSCOPY  10/20/2012   Procedure: ESOPHAGOGASTRODUODENOSCOPY (EGD);  Surgeon: Arta Silence, MD;  Location: [facility];  Service: Endoscopy;  Laterality: Left;    EYE SURGERY     cataracts   KNEE ARTHROSCOPY  05/11/2012   Procedure: ARTHROSCOPY KNEE;  Surgeon: Hessie Dibble, MD;  Location: [place];  Service: Orthopedics;  Laterality: Right;  left knee medial menisectomy and chondroplasty   THYROIDECTOMY, PARTIAL     TONSILLECTOMY      Prior to Admission medications   Medication Sig Start Date End Date Taking? Authorizing Provider  acetaminophen (TYLENOL) 500 MG tablet Take 1,000 mg by mouth every 4 (four) hours as needed for mild pain, headache or fever.   Yes [provider]  ALPRAZolam Duanne Moron) 1 MG tablet Take 1 mg by mouth daily as needed for anxiety. 06/10/21  Yes [provider]  aspirin EC 81 MG EC tablet Take 1 tablet (81 mg total) by mouth daily. Swallow whole. 05/04/21  Yes [place] Eric J, DO  buPROPion (WELLBUTRIN SR) 200 MG 12 hr tablet Take 200 mg by mouth daily.    Yes [provider]  busPIRone (BUSPAR) 5 MG tablet Take 5 mg by mouth 3 (three) times daily.   Yes [provider]  carvedilol (COREG) 6.25 MG tablet Take 1 tablet (6.25 mg total) by mouth 2 (two) times daily. 09/05/20  Yes [place], Peter M, MD  docusate sodium (COLACE) 100 MG capsule Take 100 mg by mouth 2 (two) times daily.   Yes [provider]  escitalopram (LEXAPRO) 20 MG tablet Take 20 mg by mouth daily.   Yes [provider]  gabapentin (NEURONTIN) 300 MG capsule Take 300 mg by mouth at bedtime.   Yes [provider]  levothyroxine (SYNTHROID) 100 MCG tablet Take 100 mcg by mouth daily.   Yes [provider]  loperamide (IMODIUM) 2 MG capsule Take 2 mg by mouth 4 (four) times daily as needed for diarrhea or loose stools.   Yes [provider]  losartan (COZAAR) 50 MG tablet Take 50 mg by mouth daily. 05/13/21  Yes [provider]  ondansetron (ZOFRAN) 4 MG tablet Take 4 mg by mouth every 8 (eight) hours as needed for nausea/vomiting. 10/04/20  Yes [provider]  pantoprazole (PROTONIX) 40 MG tablet Take 40 mg by mouth daily.   Yes [provider]  polyethylene glycol (MIRALAX / GLYCOLAX) 17 g packet Take 17 g by mouth See admin instructions. 17g every other day as needed for constipation   Yes [provider]  clopidogrel (PLAVIX) 75 MG tablet Take 1 tablet (75 mg total) by mouth daily. Patient not taking: Reported on 07/09/2021 05/04/21 05/04/22  [place] Donnamarie Poag, DO  escitalopram (LEXAPRO) 10 MG tablet Take 2 tablets (20 mg total) by mouth daily. Patient not taking: No sig reported 05/04/21 06/03/21  [place] Donnamarie Poag, DO  losartan (COZAAR) 25 MG tablet Take 0.5 tablets (12.5 mg total) by mouth daily. Patient not taking: Reported on 07/09/2021 05/04/21 08/02/21  [place] Eric J, DO    Scheduled Meds:  aspirin EC  81 mg Oral Daily   buPROPion  200 mg Oral Daily   carvedilol  6.25 mg Oral BID WC   clopidogrel  75 mg Oral Daily   enoxaparin (LOVENOX) injection  40 mg Subcutaneous QHS   escitalopram  20 mg Oral Daily   ipratropium-albuterol  3 mL Nebulization Q6H   levothyroxine  100 mcg Oral Daily   pantoprazole  40 mg Oral Daily   Continuous Infusions: PRN Meds:.acetaminophen, gabapentin, guaiFENesin-dextromethorphan, melatonin, metoprolol tartrate, ondansetron (ZOFRAN) IV, polyethylene glycol  Allergies as of 07/08/2021 - Review Complete 07/08/2021  Allergen Reaction Noted   Vancomycin Hives 03/09/2017   Zosyn [piperacillin sod-tazobactam so] Hives 03/09/2017   Hydrocodone Nausea Only 02/08/2017   Morphine and related Hives 05/10/2012    Family History  Problem Relation Age of Onset   Kidney disease Mother    Heart attack Father    Heart disease Father    Cancer Sister        breast   Cancer Sister        ovarian   Cancer Sister        melanoma   Cancer Daughter        breast    Social History   Socioeconomic History   Marital status: Widowed    Spouse name: Not on file   Number of children: Not on file    Years of education: Not on file   Highest education level: Not on file  Occupational History   Not on file  Tobacco Use   Smoking status: Never   Smokeless tobacco: Never  Substance and Sexual Activity   Alcohol use: No   Drug use: No   Sexual activity: Never  Other Topics Concern   Not on file  Social History Narrative   Not on file   Social Determinants of Health   Financial Resource Strain: Not on file  Food Insecurity: Not on file  Transportation Needs: Not on file  Physical Activity: Not on file  Stress: Not on file  Social  Connections: Not on file  Intimate Partner Violence: Not on file    Review of Systems: Review of Systems  Constitutional:  Negative for chills and fever.  HENT:  Positive for nosebleeds. Negative for sore throat.   Eyes:  Negative for pain and redness.  Respiratory:  Positive for shortness of breath. Negative for cough.   Cardiovascular:  Negative for chest pain and palpitations.  Gastrointestinal:  Negative for abdominal pain, blood in stool, constipation, diarrhea, heartburn, melena, nausea and vomiting.  Genitourinary:  Negative for flank pain and hematuria.  Musculoskeletal:  Negative for falls and myalgias.  Skin:  Negative for itching and rash.  Neurological:  Negative for seizures and loss of consciousness.  Psychiatric/Behavioral:  Negative for substance abuse. The patient is not nervous/anxious.     Physical Exam: Vital signs: Vitals:   07/09/21 1000 07/09/21 1106  BP:  (!) 147/103  Pulse: 95 94  Resp: 17 (!) 28  Temp:    SpO2: 91% 100%      Physical Exam Vitals reviewed.  Constitutional:      General: She is not in acute distress. HENT:     Head: Normocephalic and atraumatic.     Nose: Nose normal. No congestion.     Mouth/Throat:     Mouth: Mucous membranes are moist.     Pharynx: Oropharynx is clear.  Cardiovascular:     Rate and Rhythm: Normal rate and regular rhythm.     Pulses: Normal pulses.  Pulmonary:      Effort: Pulmonary effort is normal. No respiratory distress.  Abdominal:     General: Bowel sounds are normal. There is no distension.     Palpations: Abdomen is soft.     Tenderness: There is abdominal tenderness (mild, LLQ/RLQ). There is no guarding or rebound.     Hernia: No hernia is present.  Musculoskeletal:     Cervical back: Normal range of motion and neck supple.  Skin:    General: Skin is warm and dry.     Coloration: Skin is pale.  Neurological:     General: No focal deficit present.     Mental Status: She is oriented to person, place, and time. She is lethargic.  Psychiatric:        Mood and Affect: Mood normal.        Behavior: Behavior normal. Behavior is cooperative.    GI:  Lab Results: Recent Labs    07/08/21 1752 07/09/21 0500  WBC 4.4 7.0  HGB 6.7* 8.3*  HCT 24.0* 28.2*  PLT 345 322   BMET Recent Labs    07/08/21 1752 07/09/21 0500  NA 136 138  K 3.6 3.8  CL 103 106  CO2 24 23  GLUCOSE 109* 124*  BUN 16 16  CREATININE 1.12* 1.10*  CALCIUM 9.2 9.0   LFT No results for input(s): PROT, ALBUMIN, AST, ALT, ALKPHOS, BILITOT, BILIDIR, IBILI in the last 72 hours. PT/INR No results for input(s): LABPROT, INR in the last 72 hours.   Studies/Results: DG CHEST PORT 1 VIEW  Result Date: 07/09/2021 CLINICAL DATA:  05/01/2021 EXAM: PORTABLE CHEST 1 VIEW COMPARISON:  None. FINDINGS: The lungs are symmetrically expanded. Mild progressive cardiomegaly is likely artifactual and related to supine positioning on the current examination. There is progressive perihilar interstitial pulmonary infiltrate which, again, may relate to supine positioning but may also represent progressive interstitial pulmonary edema, asymmetrically more severe within the left lung. No pneumothorax or pleural effusion. IMPRESSION: Mild interstitial infiltrate, progressive since prior  examination, likely reflecting mild interstitial pulmonary edema. Electronically Signed   By: Fidela Salisbury  MD   On: 07/09/2021 00:53    Impression: Acute on chronic anemia, unclear etiology.  Possibly related to recent nosebleeds.  Hemoccult negative.  No overt GI bleeding.  Remote history of anorectal carcinoma. -Hemoglobin 8.3, improved from 6.7 after 1 unit pRBCs -BUN 16/creatinine 1.10  Acute on chronic CHF: BNP 2940.5  History of PE/DVT, Plavix stopped 2 to 3 weeks ago due to epistaxis  Plan: Proceed with CT of the abdomen/pelvis with IV contrast to rule out recurrence of anorectal carcinoma or malignancy as source of anemia.  Conservative care at this time, given no overt GI bleeding and heme negative status.  Protonix 40 mg IV twice daily.  Continue to monitor H&H with transfusion as needed to maintain hemoglobin greater than 7.  Clear liquid diet okay from a GI standpoint.  [facility] will follow.   LOS: 0 days   Salley Slaughter  PA-C 07/09/2021, 12:03 PM  Contact #  [phone]

## 2021-07-10 ENCOUNTER — Observation Stay (HOSPITAL_COMMUNITY): Payer: Medicare Other

## 2021-07-10 DIAGNOSIS — D649 Anemia, unspecified: Secondary | ICD-10-CM | POA: Diagnosis not present

## 2021-07-10 DIAGNOSIS — I5043 Acute on chronic combined systolic (congestive) and diastolic (congestive) heart failure: Secondary | ICD-10-CM | POA: Diagnosis not present

## 2021-07-10 DIAGNOSIS — R0602 Shortness of breath: Secondary | ICD-10-CM | POA: Diagnosis not present

## 2021-07-10 DIAGNOSIS — Z85048 Personal history of other malignant neoplasm of rectum, rectosigmoid junction, and anus: Secondary | ICD-10-CM | POA: Diagnosis not present

## 2021-07-10 DIAGNOSIS — I517 Cardiomegaly: Secondary | ICD-10-CM | POA: Diagnosis not present

## 2021-07-10 LAB — COMPREHENSIVE METABOLIC PANEL
ALT: 18 U/L (ref 0–44)
AST: 24 U/L (ref 15–41)
Albumin: 3.4 g/dL — ABNORMAL LOW (ref 3.5–5.0)
Alkaline Phosphatase: 62 U/L (ref 38–126)
Anion gap: 9 (ref 5–15)
BUN: 19 mg/dL (ref 8–23)
CO2: 28 mmol/L (ref 22–32)
Calcium: 9 mg/dL (ref 8.9–10.3)
Chloride: 102 mmol/L (ref 98–111)
Creatinine, Ser: 1.28 mg/dL — ABNORMAL HIGH (ref 0.44–1.00)
GFR, Estimated: 40 mL/min — ABNORMAL LOW (ref >60)
Glucose, Bld: 94 mg/dL (ref 70–99)
Potassium: 3 mmol/L — ABNORMAL LOW (ref 3.5–5.1)
Sodium: 139 mmol/L (ref 135–145)
Total Bilirubin: 0.8 mg/dL (ref 0.3–1.2)
Total Protein: 7.2 g/dL (ref 6.5–8.1)

## 2021-07-10 LAB — TYPE AND SCREEN
ABO/RH(D): A POS
Antibody Screen: NEGATIVE
Unit division: 0

## 2021-07-10 LAB — CBC WITH DIFFERENTIAL/PLATELET
Abs Immature Granulocytes: 0.01 10*3/uL (ref 0.00–0.07)
Basophils Absolute: 0.1 10*3/uL (ref 0.0–0.1)
Basophils Relative: 1 %
Eosinophils Absolute: 0.2 10*3/uL (ref 0.0–0.5)
Eosinophils Relative: 5 %
HCT: 30.5 % — ABNORMAL LOW (ref 36.0–46.0)
Hemoglobin: 9.1 g/dL — ABNORMAL LOW (ref 12.0–15.0)
Immature Granulocytes: 0 %
Lymphocytes Relative: 25 %
Lymphs Abs: 1.2 10*3/uL (ref 0.7–4.0)
MCH: 24.9 pg — ABNORMAL LOW (ref 26.0–34.0)
MCHC: 29.8 g/dL — ABNORMAL LOW (ref 30.0–36.0)
MCV: 83.6 fL (ref 80.0–100.0)
Monocytes Absolute: 0.4 10*3/uL (ref 0.1–1.0)
Monocytes Relative: 8 %
Neutro Abs: 2.8 10*3/uL (ref 1.7–7.7)
Neutrophils Relative %: 61 %
Platelets: 354 10*3/uL (ref 150–400)
RBC: 3.65 MIL/uL — ABNORMAL LOW (ref 3.87–5.11)
RDW: 17 % — ABNORMAL HIGH (ref 11.5–15.5)
WBC: 4.6 10*3/uL (ref 4.0–10.5)
nRBC: 0.4 % — ABNORMAL HIGH (ref 0.0–0.2)

## 2021-07-10 LAB — LIPID PANEL
Cholesterol: 161 mg/dL (ref 0–200)
HDL: 43 mg/dL (ref >40)
LDL Cholesterol: 108 mg/dL — ABNORMAL HIGH (ref 0–99)
Total CHOL/HDL Ratio: 3.7 RATIO
Triglycerides: 50 mg/dL (ref <150)
VLDL: 10 mg/dL (ref 0–40)

## 2021-07-10 LAB — BPAM RBC
Blood Product Expiration Date: 202207252359
ISSUE DATE / TIME: 202207190032
Unit Type and Rh: 6200

## 2021-07-10 LAB — PHOSPHORUS: Phosphorus: 4.4 mg/dL (ref 2.5–4.6)

## 2021-07-10 LAB — BRAIN NATRIURETIC PEPTIDE: B Natriuretic Peptide: 3066.5 pg/mL — ABNORMAL HIGH (ref 0.0–100.0)

## 2021-07-10 LAB — MAGNESIUM: Magnesium: 1.9 mg/dL (ref 1.7–2.4)

## 2021-07-10 MED ORDER — TAMSULOSIN HCL 0.4 MG PO CAPS: 0.4000 mg | ORAL_CAPSULE | Freq: Every day | ORAL | 0 refills | Status: DC

## 2021-07-10 MED ORDER — TAMSULOSIN HCL 0.4 MG PO CAPS
0.4000 mg | ORAL_CAPSULE | Freq: Every day | ORAL | Status: DC
  Administered 2021-07-10: 0.4 mg via ORAL
  Filled 2021-07-10: qty 1

## 2021-07-10 MED ORDER — ALPRAZOLAM 1 MG PO TABS
1.0000 mg | ORAL_TABLET | Freq: Every day | ORAL | 0 refills | Status: DC | PRN
Start: 2021-07-10 — End: 2022-12-18

## 2021-07-10 MED ORDER — POTASSIUM CHLORIDE CRYS ER 20 MEQ PO TBCR
40.0000 meq | EXTENDED_RELEASE_TABLET | Freq: Once | ORAL | Status: AC
  Administered 2021-07-10: 40 meq via ORAL
  Filled 2021-07-10: qty 2

## 2021-07-10 MED ORDER — FUROSEMIDE 20 MG PO TABS
20.0000 mg | ORAL_TABLET | Freq: Every day | ORAL | Status: DC
  Administered 2021-07-10: 20 mg via ORAL
  Filled 2021-07-10: qty 1

## 2021-07-10 MED ORDER — ROSUVASTATIN CALCIUM 20 MG PO TABS: 20.0000 mg | ORAL_TABLET | Freq: Every day | ORAL | 1 refills | Status: DC

## 2021-07-10 MED ORDER — FUROSEMIDE 20 MG PO TABS: 20.0000 mg | ORAL_TABLET | Freq: Every day | ORAL | 1 refills | Status: DC

## 2021-07-10 MED ORDER — MAGNESIUM SULFATE IN D5W 1-5 GM/100ML-% IV SOLN
1.0000 g | Freq: Once | INTRAVENOUS | Status: AC
  Administered 2021-07-10: 1 g via INTRAVENOUS
  Filled 2021-07-10 (×2): qty 100

## 2021-07-10 MED ORDER — ROSUVASTATIN CALCIUM 20 MG PO TABS
20.0000 mg | ORAL_TABLET | Freq: Every day | ORAL | 1 refills | Status: DC
Start: 2021-07-10 — End: 2021-07-10

## 2021-07-10 NOTE — TOC Initial Note (Signed)
Transition of Care [facility]) - Initial/Assessment Note    Patient Details  Name: Cindy [name] MRN: [id number] Date of Birth: 12-19-1930  [facility] CM/SW Contact:    Tresa Endo Phone Number: 07/10/2021, 2:21 PM  Clinical Narrative:                 Pt was adminted from [facility]) for Symptomatic anemia yesterday and will DC back today with no further needs. CSW will fax FL2 and DC summary to the facility to [facility] and follow up with the facility for DC.  Expected Discharge Plan: Assisted Living Barriers to Discharge: Barriers Resolved   Patient Goals and CMS Choice   CMS Medicare.gov Compare Post Acute Care list provided to:: Patient Choice offered to / list presented to : Patient  Expected Discharge Plan and Services Expected Discharge Plan: Assisted Living In-house Referral: Clinical Social Work Discharge Planning Services: NA Post Acute Care Choice: NA Living arrangements for the past 2 months: [place] Expected Discharge Date: 07/10/21                                    Prior Living Arrangements/Services Living arrangements for the past 2 months: [place] Lives with:: Facility Resident Patient language and need for interpreter reviewed:: Yes Do you feel safe going back to the place where you live?: Yes      Need for Family Participation in Patient Care: Yes (Comment) Care giver support system in place?: Yes (comment)   Criminal Activity/Legal Involvement Pertinent to Current Situation/Hospitalization: No - Comment as needed  Activities of Daily Living Home Assistive Devices/Equipment: Wheelchair ADL Screening (condition at time of admission) Patient's cognitive ability adequate to safely complete daily activities?: Yes Is the patient deaf or have difficulty hearing?: Yes Does the patient have difficulty seeing, even when wearing glasses/contacts?: No Does the patient have  difficulty concentrating, remembering, or making decisions?: No Patient able to express need for assistance with ADLs?: Yes Does the patient have difficulty dressing or bathing?: Yes Independently performs ADLs?: Yes (appropriate for developmental age) Does the patient have difficulty walking or climbing stairs?: Yes Weakness of Legs: Both Weakness of Arms/Hands: None  Permission Sought/Granted Permission sought to share information with : Family Supports Permission granted to share information with : Yes, Verbal Permission Granted  Share Information with NAME: Cup[place],Sherian  Permission granted to share info w AGENCY: [facility] granted to share info w Relationship: Daughter  Permission granted to share info w Contact Information: [phone]  Emotional Assessment Appearance:: Appears stated age Attitude/Demeanor/Rapport: Unable to Assess Affect (typically observed): Unable to Assess Orientation: : Oriented to Self, Oriented to Place Alcohol / Substance Use: Not Applicable Psych Involvement: No (comment)  Admission diagnosis:  Wheezing [R06.2] Symptomatic anemia [D64.9] Patient Active Problem List   Diagnosis Date Noted   Symptomatic anemia 07/08/2021   NSTEMI (non-ST elevated myocardial infarction) ([place]) 05/01/2021   Acute on chronic combined systolic (congestive) and diastolic (congestive) heart failure ([place]) 05/01/2021   CHF (congestive heart failure) ([place]) 08/14/2020   DVT (deep venous thrombosis) ([place]) 07/29/2020   Pulmonary embolus ([place]) 07/28/2020   Anemia 07/28/2020   Pulmonary embolism ([place]) 07/28/2020   Acute respiratory failure with hypoxia ([place]) 07/28/2020   Dementia with behavioral disturbance ([place])    DNR (do not resuscitate)    Palliative care by specialist    Adult  failure to thrive    Acetabular fracture ([facility]) 07/07/2020   Sepsis ([place]) 03/09/2017   Nausea vomiting and diarrhea 03/09/2017   Elevated troponin 03/09/2017   Chest pain  12/25/2013   Dyspnea 12/25/2013   Restless leg syndrome 12/25/2013   Mucositis 10/27/2013   Hypophosphatemia 10/27/2013   Protein-calorie malnutrition, severe ([place]) 10/25/2013   Thrombocytopenia ([place]) 10/25/2013   Hypomagnesemia 10/24/2013   Febrile neutropenia ([place]) 10/23/2013   Anal cancer ([place]) 09/09/2013   Colon cancer ([place]) 08/31/2013   Colitis, acute 03/17/2013   Gastroparesis 10/22/2012   Hand pain, left 10/22/2012   Anxiety 10/18/2012   Hyperventilation syndrome 10/18/2012   Headache(784.0) 10/18/2012   Neck pain on right side 10/18/2012   Hypertension 10/18/2012   GERD (gastroesophageal reflux disease) 10/18/2012   Hyperlipidemia 10/18/2012   Insomnia 10/18/2012   Hearing loss sensory, bilateral 10/18/2012   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/18/2012   Weakness generalized 10/17/2012   Hyponatremia 10/17/2012   Hypokalemia 10/17/2012   Hypothyroidism 10/17/2012   Fatigue 10/17/2012   PCP:  Clovia Cuff, MD Pharmacy:   [facility] [place], [place] - [address] [place] [address] Phone: [phone] Fax: [place], [place] - [place] AT [facility] [address] [place] [address] Phone: [phone] Fax: [phone]     Social Determinants of Health ([place]) Interventions    Readmission Risk Interventions Readmission Risk Prevention Plan 07/10/2020  Transportation Screening Complete  PCP or Specialist Appt within 5-7 Days Complete  Home Care Screening Complete  Medication Review (RN CM) Complete  Some recent data might be hidden

## 2021-07-10 NOTE — Progress Notes (Signed)
[facility] Progress Note  Cindy [name] 87 y.o. [birth year]  CC: Anemia   Subjective: Patient seen and examined at bedside.  Daughter at bedside.  No acute GI symptoms.  Denies blood in the stool or black stool.  ROS : Afebrile, negative for chest pain   Objective: Vital signs in last 24 hours: Vitals:   07/09/21 2334 07/10/21 0412  BP: (!) 109/57 115/60  Pulse: 63 68  Resp: 18 16  Temp: 97.8 F (36.6 C) 98.9 F (37.2 C)  SpO2: 97% 95%    Physical Exam:  General:  Alert, cooperative, no distress, appears stated age  Head:  Normocephalic, without obvious abnormality, atraumatic  Eyes:  , EOM's intact,   Lungs:   No visible respiratory distress  Heart:  Regular rate and rhythm  Abdomen:   Soft, non-tender, nondistended, bowel sounds present  Extremities: Extremities normal, atraumatic, no  edema  Pulses: 2+ and symmetric    Lab Results: Recent Labs    07/09/21 0500 07/10/21 0131  NA 138 139  K 3.8 3.0*  CL 106 102  CO2 23 28  GLUCOSE 124* 94  BUN 16 19  CREATININE 1.10* 1.28*  CALCIUM 9.0 9.0  MG  --  1.9  PHOS  --  4.4   Recent Labs    07/10/21 0131  AST 24  ALT 18  ALKPHOS 62  BILITOT 0.8  PROT 7.2  ALBUMIN 3.4*   Recent Labs    07/08/21 1752 07/09/21 0500 07/10/21 0131  WBC 4.4 7.0 4.6  NEUTROABS 2.7  --  2.8  HGB 6.7* 8.3* 9.1*  HCT 24.0* 28.2* 30.5*  MCV 88.9 87.3 83.6  PLT 345 322 354   No results for input(s): LABPROT, INR in the last 72 hours.    Assessment/Plan: -Acute on chronic anemia.  Could be from intermittent epistaxis.  No overt GI bleeding. -History of squamous cell carcinoma of anus.  No acute GI symptoms. -History of DVT and PE.  Was on Plavix which was stopped because of recurrent epistaxis.   Recommendations ----------------------------- -CT scan showed no acute changes and possible 5 millimeters vascular lesion in the left lobe of the liver. -Patient's hemoglobin is stable.  No GI  symptoms. -Discussed with hospitalist and cardiology at bedside.  Okay to resume aspirin from GI standpoint -Recommend follow-up in GI clinic in the next few weeks to arrange for outpatient MRI for follow-up on the small liver lesion. -Protonix 40 mg once a day for 8 weeks. -No further inpatient GI work-up planned.  Okay to discharge from GI standpoint.  GI will sign off.  Call us back if needed   Otis Brace MD, [place] 07/10/2021, 9:00 AM  Contact #  [phone]

## 2021-07-10 NOTE — [place] FL2 (Signed)
[place] MEDICAID FL2 LEVEL OF CARE SCREENING TOOL     IDENTIFICATION  Patient Name: Cindy [name] Birthdate: 09/26/1930 Sex: female Admission Date (Current Location): 07/08/2021  [facility] and [place] Number:  Herbalist and Address:  [facility], [place] [address], [place], [place] [address]      Provider Number: [id number]  Attending Physician Name and Address:  Caren Griffins, MD  Relative Name and Phone Number:  Jerl Mina (Daughter)   [phone]    Current Level of Care: Hospital Recommended Level of Care: [place] Prior Approval Number:    Date Approved/Denied:   PASRR Number:    Discharge Plan: Other (Comment) (ALF)    Current Diagnoses: Patient Active Problem List   Diagnosis Date Noted   Symptomatic anemia 07/08/2021   NSTEMI (non-ST elevated myocardial infarction) ([place]) 05/01/2021   Acute on chronic combined systolic (congestive) and diastolic (congestive) heart failure ([place]) 05/01/2021   CHF (congestive heart failure) ([place]) 08/14/2020   DVT (deep venous thrombosis) ([place]) 07/29/2020   Pulmonary embolus ([place]) 07/28/2020   Anemia 07/28/2020   Pulmonary embolism ([place]) 07/28/2020   Acute respiratory failure with hypoxia ([place]) 07/28/2020   Dementia with behavioral disturbance ([place])    DNR (do not resuscitate)    Palliative care by specialist    Adult failure to thrive    Acetabular fracture ([place]) 07/07/2020   Sepsis ([place]) 03/09/2017   Nausea vomiting and diarrhea 03/09/2017   Elevated troponin 03/09/2017   Chest pain 12/25/2013   Dyspnea 12/25/2013   Restless leg syndrome 12/25/2013   Mucositis 10/27/2013   Hypophosphatemia 10/27/2013   Protein-calorie malnutrition, severe ([place]) 10/25/2013   Thrombocytopenia ([place]) 10/25/2013   Hypomagnesemia 10/24/2013   Febrile neutropenia ([place]) 10/23/2013   Anal cancer ([place]) 09/09/2013   Colon cancer ([place]) 08/31/2013   Colitis, acute 03/17/2013    Gastroparesis 10/22/2012   Hand pain, left 10/22/2012   Anxiety 10/18/2012   Hyperventilation syndrome 10/18/2012   Headache(784.0) 10/18/2012   Neck pain on right side 10/18/2012   Hypertension 10/18/2012   GERD (gastroesophageal reflux disease) 10/18/2012   Hyperlipidemia 10/18/2012   Insomnia 10/18/2012   Hearing loss sensory, bilateral 10/18/2012   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/18/2012   Weakness generalized 10/17/2012   Hyponatremia 10/17/2012   Hypokalemia 10/17/2012   Hypothyroidism 10/17/2012   Fatigue 10/17/2012    Orientation RESPIRATION BLADDER Height & Weight     Self, Time  Normal Continent Weight: 140 lb (63.5 kg) Height:  5\' 6"  (167.6 cm)  BEHAVIORAL SYMPTOMS/MOOD NEUROLOGICAL BOWEL NUTRITION STATUS      Continent Diet (See DC Summary)  AMBULATORY STATUS COMMUNICATION OF NEEDS Skin   Limited Assist Verbally Normal                       Personal Care Assistance Level of Assistance  Bathing, Dressing, Feeding Bathing Assistance: Limited assistance Feeding assistance: Independent Dressing Assistance: Limited assistance     Functional Limitations Info  Sight, Hearing, Speech Sight Info: Adequate Hearing Info: Adequate Speech Info: Adequate    SPECIAL CARE FACTORS FREQUENCY                       Contractures      Additional Factors Info  Code Status, Allergies Code Status Info: DNR Allergies Info: Vancomycin   Zosyn (Piperacillin Sod-tazobactam So)   Hydrocodone   Morphine And Related  Current Medications (07/10/2021):  This is the current hospital active medication list Current Facility-Administered Medications  Medication Dose Route Frequency Provider Last Rate Last Admin   acetaminophen (TYLENOL) tablet 650 mg  650 mg Oral Q6H PRN Irene Pap N, DO   650 mg at 07/09/21 0735   ALPRAZolam (XANAX) tablet 1 mg  1 mg Oral Daily PRN Raiford Noble Latif, DO   1 mg at 07/09/21 1543   aspirin EC tablet 81 mg  81 mg Oral  Daily Kayleen Memos, DO   81 mg at 07/10/21 1200   buPROPion ER (WELLBUTRIN SR) 12 hr tablet 200 mg  200 mg Oral Daily Irene Pap N, DO   200 mg at 07/10/21 8022   busPIRone (BUSPAR) tablet 5 mg  5 mg Oral TID Raiford Noble Latif, DO   5 mg at 07/10/21 0946   carvedilol (COREG) tablet 6.25 mg  6.25 mg Oral BID WC Irene Pap N, DO   6.25 mg at 07/10/21 0947   enoxaparin (LOVENOX) injection 40 mg  40 mg Subcutaneous QHS Irene Pap N, DO   40 mg at 07/09/21 2216   escitalopram (LEXAPRO) tablet 20 mg  20 mg Oral Daily Irene Pap N, DO   20 mg at 07/10/21 3361   furosemide (LASIX) tablet 20 mg  20 mg Oral Daily Martinique, Peter M, MD   20 mg at 07/10/21 0947   gabapentin (NEURONTIN) capsule 300 mg  300 mg Oral BID PRN Irene Pap N, DO   300 mg at 07/09/21 0114   guaiFENesin-dextromethorphan (ROBITUSSIN DM) 100-10 MG/5ML syrup 5 mL  5 mL Oral Q4H PRN Irene Pap N, DO   5 mL at 07/09/21 0729   ipratropium-albuterol (DUONEB) 0.5-2.5 (3) MG/3ML nebulizer solution 3 mL  3 mL Nebulization Q6H PRN Sheikh, Omair Latif, DO       levothyroxine (SYNTHROID) tablet 100 mcg  100 mcg Oral Daily Irene Pap N, DO   100 mcg at 07/10/21 0650   losartan (COZAAR) tablet 50 mg  50 mg Oral Daily Sande Rives E, PA-C   50 mg at 07/10/21 2244   melatonin tablet 3 mg  3 mg Oral QHS PRN Irene Pap N, DO   3 mg at 07/09/21 0328   metoprolol tartrate (LOPRESSOR) injection 2.5 mg  2.5 mg Intravenous Q6H PRN Irene Pap N, DO       ondansetron (ZOFRAN) injection 4 mg  4 mg Intravenous Q6H PRN Irene Pap N, DO   4 mg at 07/09/21 0153   pantoprazole (PROTONIX) EC tablet 40 mg  40 mg Oral BID Salley Slaughter, PA-C   40 mg at 07/10/21 0947   polyethylene glycol (MIRALAX / GLYCOLAX) packet 17 g  17 g Oral Daily PRN Irene Pap N, DO       rosuvastatin (CRESTOR) tablet 20 mg  20 mg Oral Daily Sande Rives E, PA-C   20 mg at 07/09/21 2213   tamsulosin (FLOMAX) capsule 0.4 mg  0.4 mg Oral Daily Caren Griffins, MD   0.4 mg at 07/10/21 9753     Discharge Medications: Please see discharge summary for a list of discharge medications.  Relevant Imaging Results:  Relevant Lab Results:   Additional Information [phone]; [place] COVID-19 Vaccine 03/20/2020 , 02/23/2020  Reece Agar, LCSWA

## 2021-07-10 NOTE — Plan of Care (Signed)
  Problem: Education: Goal: Knowledge of General Education information will improve Description: Including pain rating scale, medication(s)/side effects and non-pharmacologic comfort measures Outcome: Adequate for Discharge   

## 2021-07-10 NOTE — Progress Notes (Signed)
Progress Note  Patient Name: Cindy [name] Date of Encounter: 07/10/2021  [facility] Cardiologist: Dayden Viverette Martinique, MD   Subjective   Feels well today. No dyspnea or chest pain  Inpatient Medications    Scheduled Meds:  aspirin EC  81 mg Oral Daily   buPROPion  200 mg Oral Daily   busPIRone  5 mg Oral TID   carvedilol  6.25 mg Oral BID WC   enoxaparin (LOVENOX) injection  40 mg Subcutaneous QHS   escitalopram  20 mg Oral Daily   furosemide  40 mg Oral Daily   levothyroxine  100 mcg Oral Daily   losartan  50 mg Oral Daily   pantoprazole  40 mg Oral BID   potassium chloride  40 mEq Oral Once   rosuvastatin  20 mg Oral Daily   tamsulosin  0.4 mg Oral Daily   Continuous Infusions:  PRN Meds: acetaminophen, ALPRAZolam, gabapentin, guaiFENesin-dextromethorphan, ipratropium-albuterol, melatonin, metoprolol tartrate, ondansetron (ZOFRAN) IV, polyethylene glycol   Vital Signs    Vitals:   07/09/21 1951 07/09/21 2036 07/09/21 2334 07/10/21 0412  BP: 122/60  (!) 109/57 115/60  Pulse: 62 (!) 56 63 68  Resp: 20 18 18 16   Temp: 97.9 F (36.6 C)  97.8 F (36.6 C) 98.9 F (37.2 C)  TempSrc: Oral  Oral Oral  SpO2: 100% 93% 97% 95%  Weight:      Height:        Intake/Output Summary (Last 24 hours) at 07/10/2021 0900 Last data filed at 07/10/2021 6314 Gross per 24 hour  Intake --  Output 2400 ml  Net -2400 ml   Last 3 Weights 07/08/2021 06/10/2021 05/08/2021  Weight (lbs) 140 lb 145 lb 136 lb 11 oz  Weight (kg) 63.504 kg 65.772 kg 62 kg      Telemetry    Not on tele - Personally Reviewed  ECG    none - Personally Reviewed  Physical Exam   GEN: elderly WF in No acute distress.   Neck: No JVD Cardiac: RRR, no murmurs, rubs, or gallops.  Respiratory: Clear to auscultation bilaterally. GI: Soft, nontender, non-distended  MS: No edema; No deformity. Neuro:  Nonfocal  Psych: Normal affect   Labs    High Sensitivity Troponin:  No results for input(s):  TROPONINIHS in the last 720 hours.    Chemistry Recent Labs  Lab 07/08/21 1752 07/09/21 0500 07/10/21 0131  NA 136 138 139  K 3.6 3.8 3.0*  CL 103 106 102  CO2 24 23 28   GLUCOSE 109* 124* 94  BUN 16 16 19   CREATININE 1.12* 1.10* 1.28*  CALCIUM 9.2 9.0 9.0  PROT  --   --  7.2  ALBUMIN  --   --  3.4*  AST  --   --  24  ALT  --   --  18  ALKPHOS  --   --  62  BILITOT  --   --  0.8  GFRNONAA 47* 48* 40*  ANIONGAP 9 9 9      Hematology Recent Labs  Lab 07/08/21 1752 07/09/21 0500 07/10/21 0131  WBC 4.4 7.0 4.6  RBC 2.70* 3.23* 3.65*  HGB 6.7* 8.3* 9.1*  HCT 24.0* 28.2* 30.5*  MCV 88.9 87.3 83.6  MCH 24.8* 25.7* 24.9*  MCHC 27.9* 29.4* 29.8*  RDW 17.0* 16.7* 17.0*  PLT 345 322 354    BNP Recent Labs  Lab 07/09/21 0500 07/10/21 0131  BNP 2,940.5* 3,066.5*     DDimer No results for  input(s): DDIMER in the last 168 hours.   Radiology    CT ABDOMEN PELVIS W CONTRAST  Result Date: 07/09/2021 CLINICAL DATA:  Anemia, history of colon cancer, GERD, hypertension EXAM: CT ABDOMEN AND PELVIS WITH CONTRAST TECHNIQUE: Multidetector CT imaging of the abdomen and pelvis was performed using the standard protocol following bolus administration of intravenous contrast. Sagittal and coronal MPR images reconstructed from axial data set. CONTRAST:  157mL OMNIPAQUE IOHEXOL 300 MG/ML SOLN IV. No oral contrast. COMPARISON:  04/29/2021 FINDINGS: Lower chest: Small BILATERAL pleural effusions RIGHT greater than LEFT with minimal bibasilar atelectasis. Hepatobiliary: Post cholecystectomy. Question 5 mm hypervascular lesion lateral segment LEFT lobe liver image 16. Remainder of liver unremarkable. No biliary dilatation. Pancreas: Atrophic, otherwise unremarkable Spleen: Normal appearance Adrenals/Urinary Tract: Adrenal glands normal appearance. Mildly dilated renal collecting systems and ureters bilaterally, question due to distended bladder, no mass or calculi visualized. Tiny inferior pole  RIGHT renal cyst. Kidneys otherwise unremarkable. Bladder normal appearance. Stomach/Bowel: Appendix not visualized. Mild sigmoid diverticulosis without evidence of diverticulitis. Stomach and bowel loops otherwise normal appearance. Vascular/Lymphatic: Atherosclerotic calcifications aorta without aneurysm. No adenopathy. Scattered pelvic phleboliths. Reproductive: Uterus surgically absent. Nonvisualization of ovaries. Other: No free air or free fluid. No hernia or inflammatory process. Musculoskeletal: Osseous demineralization. Chronic fractures of the anterior and posterior columns of the RIGHT acetabulum with advanced RIGHT hip degenerative changes and femoral head deformity. IMPRESSION: Mild sigmoid diverticulosis without evidence of diverticulitis. Mildly dilated renal collecting systems and ureters bilaterally, question due to distended bladder. Small BILATERAL pleural effusions RIGHT greater than LEFT with minimal bibasilar atelectasis. Question 5 mm hypervascular lesion lateral segment LEFT lobe liver, nonspecific, can assess by follow-up MR imaging with and without contrast. Chronic fractures of the anterior and posterior columns of the RIGHT acetabulum with advanced RIGHT hip degenerative changes/deformity. Aortic Atherosclerosis (ICD10-I70.0). Electronically Signed   By: Lavonia Dana M.D.   On: 07/09/2021 13:19   DG CHEST PORT 1 VIEW  Result Date: 07/10/2021 CLINICAL DATA:  Shortness of breath. EXAM: PORTABLE CHEST 1 VIEW COMPARISON:  07/09/2021. FINDINGS: Cardiomegaly with persistent mild interstitial prominence suggesting interstitial edema. Pneumonitis cannot be excluded. No pleural effusion or pneumothorax. Surgical clips right upper quadrant. Degenerative change thoracic spine. IMPRESSION: Cardiomegaly with persistent mild interstitial prominence suggesting interstitial edema. Pneumonitis cannot be excluded. Similar findings noted on prior exam. Electronically Signed   By: Marcello Moores  Register   On:  07/10/2021 06:05   DG CHEST PORT 1 VIEW  Result Date: 07/09/2021 CLINICAL DATA:  05/01/2021 EXAM: PORTABLE CHEST 1 VIEW COMPARISON:  None. FINDINGS: The lungs are symmetrically expanded. Mild progressive cardiomegaly is likely artifactual and related to supine positioning on the current examination. There is progressive perihilar interstitial pulmonary infiltrate which, again, may relate to supine positioning but may also represent progressive interstitial pulmonary edema, asymmetrically more severe within the left lung. No pneumothorax or pleural effusion. IMPRESSION: Mild interstitial infiltrate, progressive since prior examination, likely reflecting mild interstitial pulmonary edema. Electronically Signed   By: Fidela Salisbury MD   On: 07/09/2021 00:53    Cardiac Studies   Echocardiogram 05/01/2021: Impressions: 1. Severe hypokinesis of the anterior, septal and apical walls; overall  moderate to severe LV dysfunction.   2. Left ventricular ejection fraction, by estimation, is 30 to 35%. The  left ventricle has moderate to severely decreased function. The left  ventricle demonstrates regional wall motion abnormalities (see scoring  diagram/findings for description). Left  ventricular diastolic parameters are consistent with Grade II diastolic  dysfunction (pseudonormalization).  Elevated left atrial pressure.   3. Right ventricular systolic function is normal. The right ventricular  size is normal. There is severely elevated pulmonary artery systolic  pressure.   4. Left atrial size was moderately dilated.   5. The mitral valve is abnormal. Mild mitral valve regurgitation. No  evidence of mitral stenosis.   6. The aortic valve is tricuspid. Aortic valve regurgitation is trivial.  Mild aortic valve sclerosis is present, with no evidence of aortic valve  stenosis.   7. The inferior vena cava is normal in size with greater than 50%  respiratory variability, suggesting right atrial pressure of  3 mmHg.    Patient Profile     87 y.o. female with a history of chronic combined CHF with EF of 30-35% on Echo in 04/2021, prior PE/DVT no longer on anticoagulation, hypertension, hyperlipidemia, hypothyroidism, GERD, depression, and advanced dementia who is being seen 07/09/2021 for the evaluation of CHF at the request of Dr. Alfredia Ferguson.  Assessment & Plan    Acute on Chronic Combined CHF - Patient presented with acute symptomatic anemia with hemglobin of 6.7. - BNP markedly elevated at 2,940. - Chest x-ray showed mild interstitial infiltrate (progressive since prior imaging) likely reflecting mild interstitial pulmonary edema. - Echo in 04/2021 showed 30-35% with severe hypokinesis of anterior/septal/apical walls and grade 2 diastolic dysfunction. - Received one dose of IV Lasix 40mg  in the ED with excellent urine output. - I/O negative 2.4 liters - Patient does not appear volume overloaded on exam. - Will start PO Lasix 20 mg daily  - Continue Coreg 6.25mg  twice daily. - Will restart home Losartan 50mg  daily. - Monitor daily weights, strict I/O's, and renal function.    2. CAD with Recent NSTEMI - Patient admitted in 04/2021 with NSTEMI. Given age and advanced dementia, patient was treated conservatively with 48 hours of IV Heparin. - No chest pain. - Plavix recently stopped due to recurrent epistaxis. Aspirin was continued on admission. - I would not resume Plavix given high bleeding risk - Continue beta-blocker. - Has not been on statin. Will start Crestor 20mg  daily.   3. Hypertension - BP now controlled.  - Continue home Coreg 6.25mg  twice daily. - Restart home Losartan 50mg  daily.   4. Hyperlipidemia - History of hyperlipidemia in chart but not on a statin. - Given recent NSTEMI, will start Crestor 20mg  daily.   5. History of PE/DVT - Diagnosed in 07/2020. Completed 6 months of Eliquis.   6. Acute Anemia - Hemoglobin 6.7 on admission. S/p 1 unit of PRBC. Repeat hemoglobin up  to 9.1 - per primary team   [facility] will sign off.   Medication Recommendations:  as per above Other recommendations (labs, testing, etc):  none Follow up as an outpatient:  approximately 2-3 weeks.  For questions or updates, please contact [facility] Please consult [email or web address] for contact info under        Signed, Chidiebere Wynn Martinique, MD  07/10/2021, 9:00 AM

## 2021-07-10 NOTE — Progress Notes (Signed)
Discharge instructions (including medications) discussed with and copy provided to patient/caregiver 

## 2021-07-10 NOTE — Discharge Summary (Signed)
Physician Discharge Summary  Cindy [name] TSV:[id number] DOB: Jul 31, 1930 DOA: 07/08/2021  PCP: Clovia Cuff, MD  Admit date: 07/08/2021 Discharge date: 07/10/2021  Admitted From: home Disposition:  home  Recommendations for Outpatient Follow-up:  Follow up with PCP in 1-2 weeks  Home Health: none Equipment/Devices: none  Discharge Condition: stable CODE STATUS: DNR Diet recommendation: regular  HPI: Per admitting MD, [name] is a 87 y.o. female with medical history significant for anal cancer, anemia of chronic disease, essential hypertension, polyneuropathy, hypothyroidism, chronic anxiety/depression, GERD who presented to [facility] ED from her PCPs office due to low hemoglobin.  She endorses occasional shortness of breath.  She had a recent nosebleed for which she was seen in the ED about a month ago while on aspirin and Plavix.  She has been off DAPT for the past 10 days without recurrence of epistaxis.  Work-up in the ED revealed no overt bleeding with hemoglobin of 6.7.  1 unit PRBCs was ordered to be transfused by EDP.  TRH, hospitalist team, was asked to admit.  GI has been consulted.  While in the ED she developed acute onset of coughing spell.  Was given duo nebs.  Chest x-ray was done revealing mild interstitial infiltrates.  COVID-19 screening test pending.  Hospital Course / Discharge diagnoses: Symptomatic anemia/acute anemia in the setting of epistaxis -She is being followed by hematology oncology in the past and since to the ED from her PCP office given her low hemoglobin. In the ED her hemoglobin was 6.7 and was transfused unit of packed red blood cells.  GI consulted and followed patient while hospitalized, there is no clear GI bleed and this is possibly related to her epistaxis.  She was cleared to be resumed on her aspirin.  Her Plavix has been discontinued as an outpatient and she will remain off of it.   Active problems Acute on chronic combined systolic  and diastolic CHF -Likely exacerbated in the setting of her low hemoglobin.  Cardiology consulted and evaluated patient while hospitalized.  She was given a dose of IV Lasix with excellent diuresis, she seems back to baseline and will be placed on oral Lasix on discharge.  She is also to be continued on Coreg along with her ARB. Recent echo done showed severe hypokinesis of the anterior septal and apical walls overall moderate to severe left ventricular dysfunction with L left ventricular ejection fraction of 30 to 35% and grade 2 diastolic dysfunction Cough, acute onset -Coughing spell while in the ED along with wheezing in the setting of fluid overload.  Resolved with Lasix. Essential hypertension -resume home medications Hypothyroidism -resume home medications  Chronic anxiety/depression -resume home medications  GERD  Polyneuropathy -Resume home gabapentin History of anal cancer -Follows with hematology oncology and radiation oncology.  CT scan during this admission without significant findings CAD with recent NSTEMI -She was recently admitted back in May with an NSTEMI and given her advanced age and dementia she was treated conservatively with IV heparin for 48 hours. Currently she denies any chest pain.  Hyperlipidemia -given her recent NSTEMI cardiology is going to to start her on a rosuvastatin History of PE/DVT -Was diagnosed back in August 2021 and she completed 6 months of Eliquis History of right-sided hip fracture that is nonoperative and patient is wheelchair-bound -Right leg is more atrophied than the left leg  Sepsis ruled out   Discharge Instructions   Allergies as of 07/10/2021       Reactions  Vancomycin Hives   Zosyn and vanc given together; unclear which was precipitating med. Appears to have tolerated cephalosporins in the past.   Zosyn [piperacillin Sod-tazobactam So] Hives   Zosyn and vanc given together; unclear which was precipitating med. Appears to have tolerated  cephalosporins in the past.   Hydrocodone Nausea Only   Morphine And Related Hives        Medication List     STOP taking these medications    clopidogrel 75 MG tablet Commonly known as: PLAVIX       TAKE these medications    acetaminophen 500 MG tablet Commonly known as: TYLENOL Take 1,000 mg by mouth every 4 (four) hours as needed for mild pain, headache or fever.   ALPRAZolam 1 MG tablet Commonly known as: XANAX Take 1 tablet (1 mg total) by mouth daily as needed for anxiety.   aspirin 81 MG EC tablet Take 1 tablet (81 mg total) by mouth daily. Swallow whole.   buPROPion 200 MG 12 hr tablet Commonly known as: WELLBUTRIN SR Take 200 mg by mouth daily.   busPIRone 5 MG tablet Commonly known as: BUSPAR Take 5 mg by mouth 3 (three) times daily.   carvedilol 6.25 MG tablet Commonly known as: COREG Take 1 tablet (6.25 mg total) by mouth 2 (two) times daily.   docusate sodium 100 MG capsule Commonly known as: COLACE Take 100 mg by mouth 2 (two) times daily.   escitalopram 20 MG tablet Commonly known as: LEXAPRO Take 20 mg by mouth daily. What changed: Another medication with the same name was removed. Continue taking this medication, and follow the directions you see here.   furosemide 20 MG tablet Commonly known as: LASIX Take 1 tablet (20 mg total) by mouth daily. Start taking on: July 11, 2021   gabapentin 300 MG capsule Commonly known as: NEURONTIN Take 300 mg by mouth at bedtime.   levothyroxine 100 MCG tablet Commonly known as: SYNTHROID Take 100 mcg by mouth daily.   loperamide 2 MG capsule Commonly known as: IMODIUM Take 2 mg by mouth 4 (four) times daily as needed for diarrhea or loose stools.   losartan 50 MG tablet Commonly known as: COZAAR Take 50 mg by mouth daily. What changed: Another medication with the same name was removed. Continue taking this medication, and follow the directions you see here.   ondansetron 4 MG  tablet Commonly known as: ZOFRAN Take 4 mg by mouth every 8 (eight) hours as needed for nausea/vomiting.   pantoprazole 40 MG tablet Commonly known as: PROTONIX Take 40 mg by mouth daily.   polyethylene glycol 17 g packet Commonly known as: MIRALAX / GLYCOLAX Take 17 g by mouth See admin instructions. 17g every other day as needed for constipation   rosuvastatin 20 MG tablet Commonly known as: CRESTOR Take 1 tablet (20 mg total) by mouth daily.   tamsulosin 0.4 MG Caps capsule Commonly known as: FLOMAX Take 1 capsule (0.4 mg total) by mouth daily. Start taking on: July 11, 2021        Follow-up Information     Gastroenterology, [facility]. Schedule an appointment as soon as possible for a visit in 2 week(s).   Why: For outpatient MRI Contact information: [place] [address] [phone]                 Consultations: Cardiology  GI  Procedures/Studies:  CT ABDOMEN PELVIS W CONTRAST  Result Date: 07/09/2021 CLINICAL DATA:  Anemia, history of colon cancer, GERD, hypertension EXAM: CT ABDOMEN AND PELVIS WITH CONTRAST TECHNIQUE: Multidetector CT imaging of the abdomen and pelvis was performed using the standard protocol following bolus administration of intravenous contrast. Sagittal and coronal MPR images reconstructed from axial data set. CONTRAST:  18mL OMNIPAQUE IOHEXOL 300 MG/ML SOLN IV. No oral contrast. COMPARISON:  04/29/2021 FINDINGS: Lower chest: Small BILATERAL pleural effusions RIGHT greater than LEFT with minimal bibasilar atelectasis. Hepatobiliary: Post cholecystectomy. Question 5 mm hypervascular lesion lateral segment LEFT lobe liver image 16. Remainder of liver unremarkable. No biliary dilatation. Pancreas: Atrophic, otherwise unremarkable Spleen: Normal appearance Adrenals/Urinary Tract: Adrenal glands normal appearance. Mildly dilated renal collecting systems and ureters bilaterally, question due to distended bladder, no mass or  calculi visualized. Tiny inferior pole RIGHT renal cyst. Kidneys otherwise unremarkable. Bladder normal appearance. Stomach/Bowel: Appendix not visualized. Mild sigmoid diverticulosis without evidence of diverticulitis. Stomach and bowel loops otherwise normal appearance. Vascular/Lymphatic: Atherosclerotic calcifications aorta without aneurysm. No adenopathy. Scattered pelvic phleboliths. Reproductive: Uterus surgically absent. Nonvisualization of ovaries. Other: No free air or free fluid. No hernia or inflammatory process. Musculoskeletal: Osseous demineralization. Chronic fractures of the anterior and posterior columns of the RIGHT acetabulum with advanced RIGHT hip degenerative changes and femoral head deformity. IMPRESSION: Mild sigmoid diverticulosis without evidence of diverticulitis. Mildly dilated renal collecting systems and ureters bilaterally, question due to distended bladder. Small BILATERAL pleural effusions RIGHT greater than LEFT with minimal bibasilar atelectasis. Question 5 mm hypervascular lesion lateral segment LEFT lobe liver, nonspecific, can assess by follow-up MR imaging with and without contrast. Chronic fractures of the anterior and posterior columns of the RIGHT acetabulum with advanced RIGHT hip degenerative changes/deformity. Aortic Atherosclerosis (ICD10-I70.0). Electronically Signed   By: Lavonia Dana M.D.   On: 07/09/2021 13:19   DG CHEST PORT 1 VIEW  Result Date: 07/10/2021 CLINICAL DATA:  Shortness of breath. EXAM: PORTABLE CHEST 1 VIEW COMPARISON:  07/09/2021. FINDINGS: Cardiomegaly with persistent mild interstitial prominence suggesting interstitial edema. Pneumonitis cannot be excluded. No pleural effusion or pneumothorax. Surgical clips right upper quadrant. Degenerative change thoracic spine. IMPRESSION: Cardiomegaly with persistent mild interstitial prominence suggesting interstitial edema. Pneumonitis cannot be excluded. Similar findings noted on prior exam.  Electronically Signed   By: Marcello Moores  Register   On: 07/10/2021 06:05   DG CHEST PORT 1 VIEW  Result Date: 07/09/2021 CLINICAL DATA:  05/01/2021 EXAM: PORTABLE CHEST 1 VIEW COMPARISON:  None. FINDINGS: The lungs are symmetrically expanded. Mild progressive cardiomegaly is likely artifactual and related to supine positioning on the current examination. There is progressive perihilar interstitial pulmonary infiltrate which, again, may relate to supine positioning but may also represent progressive interstitial pulmonary edema, asymmetrically more severe within the left lung. No pneumothorax or pleural effusion. IMPRESSION: Mild interstitial infiltrate, progressive since prior examination, likely reflecting mild interstitial pulmonary edema. Electronically Signed   By: Fidela Salisbury MD   On: 07/09/2021 00:53     Subjective: - no chest pain, shortness of breath, no abdominal pain, nausea or vomiting.   Discharge Exam: BP 130/66 (BP Location: Left Arm)   Pulse 79   Temp 98.8 F (37.1 C) (Oral)   Resp 18   Ht 5\' 6"  (1.676 m)   Wt 63.5 kg   SpO2 95%   BMI 22.60 kg/m   General: Pt is alert, awake, not in acute distress Cardiovascular: RRR, S1/S2 +, no rubs, no gallops Respiratory: CTA bilaterally, no wheezing, no rhonchi Abdominal: Soft, NT, ND, bowel sounds + Extremities: no edema, no cyanosis  The results of significant diagnostics from this hospitalization (including imaging, microbiology, ancillary and laboratory) are listed below for reference.     Microbiology: Recent Results (from the past 240 hour(s))  SARS CORONAVIRUS 2 (TAT 6-24 HRS) Nasopharyngeal Nasopharyngeal Swab     Status: None   Collection Time: 07/08/21 11:24 PM   Specimen: Nasopharyngeal Swab  Result Value Ref Range Status   SARS Coronavirus 2 NEGATIVE NEGATIVE Final    Comment: (NOTE) SARS-CoV-2 target nucleic acids are NOT DETECTED.  The SARS-CoV-2 RNA is generally detectable in upper and lower respiratory  specimens during the acute phase of infection. Negative results do not preclude SARS-CoV-2 infection, do not rule out co-infections with other pathogens, and should not be used as the sole basis for treatment or other patient management decisions. Negative results must be combined with clinical observations, patient history, and epidemiological information. The expected result is Negative.  Fact Sheet for Patients: SugarRoll.be  Fact Sheet for Healthcare Providers: https://www.woods-mathews.com/  This test is not yet approved or cleared by the [place] FDA and  has been authorized for detection and/or diagnosis of SARS-CoV-2 by FDA under an Emergency Use Authorization (EUA). This EUA will remain  in effect (meaning this test can be used) for the duration of the COVID-19 declaration under Se ction 564(b)(1) of the Act, 21 U.S.C. section 360bbb-3(b)(1), unless the authorization is terminated or revoked sooner.  Performed at [facility], [place] [address], [place],  [address]      Labs: Basic Metabolic Panel: Recent Labs  Lab 07/08/21 1752 07/09/21 0500 07/10/21 0131  NA 136 138 139  K 3.6 3.8 3.0*  CL 103 106 102  CO2 24 23 28   GLUCOSE 109* 124* 94  BUN 16 16 19   CREATININE 1.12* 1.10* 1.28*  CALCIUM 9.2 9.0 9.0  MG  --   --  1.9  PHOS  --   --  4.4   Liver Function Tests: Recent Labs  Lab 07/10/21 0131  AST 24  ALT 18  ALKPHOS 62  BILITOT 0.8  PROT 7.2  ALBUMIN 3.4*   CBC: Recent Labs  Lab 07/08/21 1752 07/09/21 0500 07/10/21 0131  WBC 4.4 7.0 4.6  NEUTROABS 2.7  --  2.8  HGB 6.7* 8.3* 9.1*  HCT 24.0* 28.2* 30.5*  MCV 88.9 87.3 83.6  PLT 345 322 354   CBG: No results for input(s): GLUCAP in the last 168 hours. Hgb A1c No results for input(s): HGBA1C in the last 72 hours. Lipid Profile Recent Labs    07/10/21 0131  CHOL 161  HDL 43  LDLCALC 108*  TRIG 50  CHOLHDL 3.7   Thyroid  function studies No results for input(s): TSH, T4TOTAL, T3FREE, THYROIDAB in the last 72 hours.  Invalid input(s): FREET3 Urinalysis    Component Value Date/Time   COLORURINE COLORLESS (A) 07/06/2017 1630   APPEARANCEUR CLEAR 07/06/2017 1630   LABSPEC 1.003 (L) 07/06/2017 1630   LABSPEC 1.020 12/02/2013 1329   PHURINE 7.0 07/06/2017 1630   GLUCOSEU NEGATIVE 07/06/2017 1630   GLUCOSEU Negative 12/02/2013 1329   HGBUR SMALL (A) 07/06/2017 1630   BILIRUBINUR NEGATIVE 07/06/2017 1630   BILIRUBINUR Negative 12/02/2013 1329   KETONESUR NEGATIVE 07/06/2017 1630   PROTEINUR NEGATIVE 07/06/2017 1630   UROBILINOGEN 0.2 12/24/2013 1938   UROBILINOGEN 0.2 12/02/2013 1329   NITRITE NEGATIVE 07/06/2017 1630   LEUKOCYTESUR NEGATIVE 07/06/2017 1630   LEUKOCYTESUR Trace 12/02/2013 1329    FURTHER DISCHARGE INSTRUCTIONS:   Get Medicines reviewed and  adjusted: Please take all your medications with you for your next visit with your Primary MD   Laboratory/radiological data: Please request your Primary MD to go over all hospital tests and procedure/radiological results at the follow up, please ask your Primary MD to get all Hospital records sent to his/her office.   In some cases, they will be blood work, cultures and biopsy results pending at the time of your discharge. Please request that your primary care M.D. goes through all the records of your hospital data and follows up on these results.   Also Note the following: If you experience worsening of your admission symptoms, develop shortness of breath, life threatening emergency, suicidal or homicidal thoughts you must seek medical attention immediately by calling 911 or calling your MD immediately  if symptoms less severe.   You must read complete instructions/literature along with all the possible adverse reactions/side effects for all the Medicines you take and that have been prescribed to you. Take any new Medicines after you have completely  understood and accpet all the possible adverse reactions/side effects.    Do not drive when taking Pain medications or sleeping medications (Benzodaizepines)   Do not take more than prescribed Pain, Sleep and Anxiety Medications. It is not advisable to combine anxiety,sleep and pain medications without talking with your primary care practitioner   Special Instructions: If you have smoked or chewed Tobacco  in the last 2 yrs please stop smoking, stop any regular Alcohol  and or any Recreational drug use.   Wear Seat belts while driving.   Please note: You were cared for by a hospitalist during your hospital stay. Once you are discharged, your primary care physician will handle any further medical issues. Please note that NO REFILLS for any discharge medications will be authorized once you are discharged, as it is imperative that you return to your primary care physician (or establish a relationship with a primary care physician if you do not have one) for your post hospital discharge needs so that they can reassess your need for medications and monitor your lab values.  Time coordinating discharge: 40 minutes  SIGNED:  Marzetta Board, MD, PhD 07/10/2021, 12:47 PM

## 2021-07-15 ENCOUNTER — Other Ambulatory Visit: Payer: Self-pay | Admitting: Orthopedic Surgery

## 2021-07-15 DIAGNOSIS — S32401G Unspecified fracture of right acetabulum, subsequent encounter for fracture with delayed healing: Secondary | ICD-10-CM | POA: Diagnosis not present

## 2021-07-15 DIAGNOSIS — M25551 Pain in right hip: Secondary | ICD-10-CM

## 2021-07-16 ENCOUNTER — Telehealth: Payer: Self-pay | Admitting: Cardiology

## 2021-07-16 NOTE — Telephone Encounter (Signed)
Pt is calling to confirm that she can stop the medication she was prescribed in the ER

## 2021-07-16 NOTE — Telephone Encounter (Signed)
OK to stop Crestor. May try to come off lasix but may need to use PRN for increased swelling or SOB given recent volume overload.  Lyllie Cobbins Martin[place] MD, [facility]

## 2021-07-16 NOTE — Telephone Encounter (Signed)
Returned the call to the daughter. She stated that the patient was started on Furosemide 20 mg once daily, rosuvastatin 20 mg once daily and flomax 0.4 once daily.   She stated the patient does not want to be on the medication anymore. She is wheel chair bound and the furosemide causes too much problem trying to get her in and out of the wheelchair. She is also having nausea and muscle pain with the Rosuvastatin. The patient had tried the rosuvastatin in the past with the same results.  The daughter was calling to see if Dr. Martinique could write discharge orders for those medications. She has been advised that the patient may need to have an appointment prior.   Fax number: [place]

## 2021-07-17 NOTE — Telephone Encounter (Signed)
Patient's daughter states the fax was not received. She says she will double check the fax number, but would like it to be sent over again.

## 2021-07-17 NOTE — Telephone Encounter (Signed)
Spoke to patient's daughter Mariella Saa Dr.Jordan's advice given.Order faxed to [facility] at fax # [phone].

## 2021-07-17 NOTE — Addendum Note (Signed)
Addended by: Kathyrn Lass on: 07/17/2021 08:53 AM   Modules accepted: Orders

## 2021-07-17 NOTE — Telephone Encounter (Signed)
Order refaxed to [facility] at fax # [phone].

## 2021-07-25 DIAGNOSIS — Z20828 Contact with and (suspected) exposure to other viral communicable diseases: Secondary | ICD-10-CM | POA: Diagnosis not present

## 2021-07-29 DIAGNOSIS — Z20828 Contact with and (suspected) exposure to other viral communicable diseases: Secondary | ICD-10-CM | POA: Diagnosis not present

## 2021-08-05 ENCOUNTER — Ambulatory Visit
Admission: RE | Admit: 2021-08-05 | Discharge: 2021-08-05 | Disposition: A | Discharge status: Home / Self Care | Payer: Medicare Other | Source: Ambulatory Visit | Attending: Orthopedic Surgery | Admitting: Orthopedic Surgery

## 2021-08-05 ENCOUNTER — Other Ambulatory Visit: Payer: Self-pay

## 2021-08-05 DIAGNOSIS — R937 Abnormal findings on diagnostic imaging of other parts of musculoskeletal system: Secondary | ICD-10-CM | POA: Diagnosis not present

## 2021-08-05 DIAGNOSIS — S32401A Unspecified fracture of right acetabulum, initial encounter for closed fracture: Secondary | ICD-10-CM | POA: Diagnosis not present

## 2021-08-05 DIAGNOSIS — M247 Protrusio acetabuli: Secondary | ICD-10-CM | POA: Diagnosis not present

## 2021-08-05 DIAGNOSIS — Z20828 Contact with and (suspected) exposure to other viral communicable diseases: Secondary | ICD-10-CM | POA: Diagnosis not present

## 2021-08-05 DIAGNOSIS — M25551 Pain in right hip: Secondary | ICD-10-CM

## 2021-08-05 DIAGNOSIS — M5137 Other intervertebral disc degeneration, lumbosacral region: Secondary | ICD-10-CM | POA: Diagnosis not present

## 2021-08-08 DIAGNOSIS — Z8616 Personal history of COVID-19: Secondary | ICD-10-CM | POA: Diagnosis not present

## 2021-08-12 DIAGNOSIS — Z20828 Contact with and (suspected) exposure to other viral communicable diseases: Secondary | ICD-10-CM | POA: Diagnosis not present

## 2021-08-12 DIAGNOSIS — S32411K Displaced fracture of anterior wall of right acetabulum, subsequent encounter for fracture with nonunion: Secondary | ICD-10-CM | POA: Diagnosis not present

## 2021-08-12 DIAGNOSIS — M25551 Pain in right hip: Secondary | ICD-10-CM | POA: Diagnosis not present

## 2021-08-14 DIAGNOSIS — F0390 Unspecified dementia without behavioral disturbance: Secondary | ICD-10-CM | POA: Diagnosis not present

## 2021-08-14 DIAGNOSIS — E038 Other specified hypothyroidism: Secondary | ICD-10-CM | POA: Diagnosis not present

## 2021-08-14 DIAGNOSIS — Z79899 Other long term (current) drug therapy: Secondary | ICD-10-CM | POA: Diagnosis not present

## 2021-08-14 DIAGNOSIS — I10 Essential (primary) hypertension: Secondary | ICD-10-CM | POA: Diagnosis not present

## 2021-08-14 DIAGNOSIS — M25551 Pain in right hip: Secondary | ICD-10-CM | POA: Diagnosis not present

## 2021-08-15 DIAGNOSIS — I5042 Chronic combined systolic (congestive) and diastolic (congestive) heart failure: Secondary | ICD-10-CM | POA: Diagnosis not present

## 2021-08-15 DIAGNOSIS — M25551 Pain in right hip: Secondary | ICD-10-CM | POA: Diagnosis not present

## 2021-08-15 DIAGNOSIS — Z20828 Contact with and (suspected) exposure to other viral communicable diseases: Secondary | ICD-10-CM | POA: Diagnosis not present

## 2021-08-15 DIAGNOSIS — I110 Hypertensive heart disease with heart failure: Secondary | ICD-10-CM | POA: Diagnosis not present

## 2021-08-15 DIAGNOSIS — E038 Other specified hypothyroidism: Secondary | ICD-10-CM | POA: Diagnosis not present

## 2021-08-19 ENCOUNTER — Ambulatory Visit: Payer: No Typology Code available for payment source | Admitting: Physician Assistant

## 2021-08-19 DIAGNOSIS — Z20828 Contact with and (suspected) exposure to other viral communicable diseases: Secondary | ICD-10-CM | POA: Diagnosis not present

## 2021-08-21 DIAGNOSIS — R238 Other skin changes: Secondary | ICD-10-CM | POA: Diagnosis not present

## 2021-08-21 DIAGNOSIS — M25551 Pain in right hip: Secondary | ICD-10-CM | POA: Diagnosis not present

## 2021-08-21 DIAGNOSIS — I10 Essential (primary) hypertension: Secondary | ICD-10-CM | POA: Diagnosis not present

## 2021-08-22 DIAGNOSIS — Z20828 Contact with and (suspected) exposure to other viral communicable diseases: Secondary | ICD-10-CM | POA: Diagnosis not present

## 2021-08-22 DIAGNOSIS — E038 Other specified hypothyroidism: Secondary | ICD-10-CM | POA: Diagnosis not present

## 2021-08-22 DIAGNOSIS — F0390 Unspecified dementia without behavioral disturbance: Secondary | ICD-10-CM | POA: Diagnosis not present

## 2021-08-22 DIAGNOSIS — Z79899 Other long term (current) drug therapy: Secondary | ICD-10-CM | POA: Diagnosis not present

## 2021-08-28 DIAGNOSIS — R739 Hyperglycemia, unspecified: Secondary | ICD-10-CM | POA: Diagnosis not present

## 2021-08-28 DIAGNOSIS — D6489 Other specified anemias: Secondary | ICD-10-CM | POA: Diagnosis not present

## 2021-08-28 DIAGNOSIS — N179 Acute kidney failure, unspecified: Secondary | ICD-10-CM | POA: Diagnosis not present

## 2021-08-28 DIAGNOSIS — E876 Hypokalemia: Secondary | ICD-10-CM | POA: Diagnosis not present

## 2021-08-28 DIAGNOSIS — I10 Essential (primary) hypertension: Secondary | ICD-10-CM | POA: Diagnosis not present

## 2021-08-29 DIAGNOSIS — Z20828 Contact with and (suspected) exposure to other viral communicable diseases: Secondary | ICD-10-CM | POA: Diagnosis not present

## 2021-09-02 DIAGNOSIS — Z20828 Contact with and (suspected) exposure to other viral communicable diseases: Secondary | ICD-10-CM | POA: Diagnosis not present

## 2021-09-04 DIAGNOSIS — Z23 Encounter for immunization: Secondary | ICD-10-CM | POA: Diagnosis not present

## 2021-09-05 DIAGNOSIS — Z8616 Personal history of COVID-19: Secondary | ICD-10-CM | POA: Diagnosis not present

## 2021-09-05 DIAGNOSIS — N179 Acute kidney failure, unspecified: Secondary | ICD-10-CM | POA: Diagnosis not present

## 2021-09-05 DIAGNOSIS — E876 Hypokalemia: Secondary | ICD-10-CM | POA: Diagnosis not present

## 2021-09-05 DIAGNOSIS — R739 Hyperglycemia, unspecified: Secondary | ICD-10-CM | POA: Diagnosis not present

## 2021-09-05 DIAGNOSIS — D6489 Other specified anemias: Secondary | ICD-10-CM | POA: Diagnosis not present

## 2021-09-09 DIAGNOSIS — Z20828 Contact with and (suspected) exposure to other viral communicable diseases: Secondary | ICD-10-CM | POA: Diagnosis not present

## 2021-09-11 DIAGNOSIS — E7439 Other disorders of intestinal carbohydrate absorption: Secondary | ICD-10-CM | POA: Diagnosis not present

## 2021-09-11 DIAGNOSIS — I10 Essential (primary) hypertension: Secondary | ICD-10-CM | POA: Diagnosis not present

## 2021-09-11 DIAGNOSIS — D509 Iron deficiency anemia, unspecified: Secondary | ICD-10-CM | POA: Diagnosis not present

## 2021-09-11 DIAGNOSIS — E538 Deficiency of other specified B group vitamins: Secondary | ICD-10-CM | POA: Diagnosis not present

## 2021-09-11 DIAGNOSIS — Z85038 Personal history of other malignant neoplasm of large intestine: Secondary | ICD-10-CM | POA: Diagnosis not present

## 2021-09-11 DIAGNOSIS — M25551 Pain in right hip: Secondary | ICD-10-CM | POA: Diagnosis not present

## 2021-09-12 DIAGNOSIS — Z8616 Personal history of COVID-19: Secondary | ICD-10-CM | POA: Diagnosis not present

## 2021-09-16 DIAGNOSIS — Z20828 Contact with and (suspected) exposure to other viral communicable diseases: Secondary | ICD-10-CM | POA: Diagnosis not present

## 2021-09-19 DIAGNOSIS — Z8616 Personal history of COVID-19: Secondary | ICD-10-CM | POA: Diagnosis not present

## 2021-09-24 DIAGNOSIS — Z8616 Personal history of COVID-19: Secondary | ICD-10-CM | POA: Diagnosis not present

## 2021-09-25 DIAGNOSIS — M25551 Pain in right hip: Secondary | ICD-10-CM | POA: Diagnosis not present

## 2021-09-25 DIAGNOSIS — I5042 Chronic combined systolic (congestive) and diastolic (congestive) heart failure: Secondary | ICD-10-CM | POA: Diagnosis not present

## 2021-09-25 DIAGNOSIS — I110 Hypertensive heart disease with heart failure: Secondary | ICD-10-CM | POA: Diagnosis not present

## 2021-09-26 DIAGNOSIS — Z20828 Contact with and (suspected) exposure to other viral communicable diseases: Secondary | ICD-10-CM | POA: Diagnosis not present

## 2021-09-30 DIAGNOSIS — Z20828 Contact with and (suspected) exposure to other viral communicable diseases: Secondary | ICD-10-CM | POA: Diagnosis not present

## 2021-10-03 DIAGNOSIS — Z8616 Personal history of COVID-19: Secondary | ICD-10-CM | POA: Diagnosis not present

## 2021-10-10 DIAGNOSIS — I5042 Chronic combined systolic (congestive) and diastolic (congestive) heart failure: Secondary | ICD-10-CM | POA: Diagnosis not present

## 2021-10-10 DIAGNOSIS — F331 Major depressive disorder, recurrent, moderate: Secondary | ICD-10-CM | POA: Diagnosis not present

## 2021-10-10 DIAGNOSIS — M25551 Pain in right hip: Secondary | ICD-10-CM | POA: Diagnosis not present

## 2021-10-10 DIAGNOSIS — I110 Hypertensive heart disease with heart failure: Secondary | ICD-10-CM | POA: Diagnosis not present

## 2021-10-13 DIAGNOSIS — U071 COVID-19: Secondary | ICD-10-CM | POA: Diagnosis not present

## 2021-10-17 DIAGNOSIS — R059 Cough, unspecified: Secondary | ICD-10-CM | POA: Diagnosis not present

## 2021-10-17 DIAGNOSIS — U071 COVID-19: Secondary | ICD-10-CM | POA: Diagnosis not present

## 2021-10-22 DIAGNOSIS — Z20822 Contact with and (suspected) exposure to covid-19: Secondary | ICD-10-CM | POA: Diagnosis not present

## 2021-10-23 DIAGNOSIS — R410 Disorientation, unspecified: Secondary | ICD-10-CM | POA: Diagnosis not present

## 2021-10-23 DIAGNOSIS — K921 Melena: Secondary | ICD-10-CM | POA: Diagnosis not present

## 2021-10-23 DIAGNOSIS — R197 Diarrhea, unspecified: Secondary | ICD-10-CM | POA: Diagnosis not present

## 2021-10-23 DIAGNOSIS — U071 COVID-19: Secondary | ICD-10-CM | POA: Diagnosis not present

## 2021-10-23 DIAGNOSIS — R634 Abnormal weight loss: Secondary | ICD-10-CM | POA: Diagnosis not present

## 2021-10-23 DIAGNOSIS — Z79899 Other long term (current) drug therapy: Secondary | ICD-10-CM | POA: Diagnosis not present

## 2021-10-24 DIAGNOSIS — N390 Urinary tract infection, site not specified: Secondary | ICD-10-CM | POA: Diagnosis not present

## 2021-10-24 DIAGNOSIS — Z79899 Other long term (current) drug therapy: Secondary | ICD-10-CM | POA: Diagnosis not present

## 2021-10-29 DIAGNOSIS — Z20822 Contact with and (suspected) exposure to covid-19: Secondary | ICD-10-CM | POA: Diagnosis not present

## 2021-10-30 DIAGNOSIS — R195 Other fecal abnormalities: Secondary | ICD-10-CM | POA: Diagnosis not present

## 2021-10-30 DIAGNOSIS — N179 Acute kidney failure, unspecified: Secondary | ICD-10-CM | POA: Diagnosis not present

## 2021-10-30 DIAGNOSIS — N390 Urinary tract infection, site not specified: Secondary | ICD-10-CM | POA: Diagnosis not present

## 2021-10-30 DIAGNOSIS — R634 Abnormal weight loss: Secondary | ICD-10-CM | POA: Diagnosis not present

## 2021-11-05 DIAGNOSIS — Z20822 Contact with and (suspected) exposure to covid-19: Secondary | ICD-10-CM | POA: Diagnosis not present

## 2021-11-06 DIAGNOSIS — E782 Mixed hyperlipidemia: Secondary | ICD-10-CM | POA: Diagnosis not present

## 2021-11-06 DIAGNOSIS — M25551 Pain in right hip: Secondary | ICD-10-CM | POA: Diagnosis not present

## 2021-11-06 DIAGNOSIS — M6281 Muscle weakness (generalized): Secondary | ICD-10-CM | POA: Diagnosis not present

## 2021-11-06 DIAGNOSIS — R635 Abnormal weight gain: Secondary | ICD-10-CM | POA: Diagnosis not present

## 2021-11-07 DIAGNOSIS — N179 Acute kidney failure, unspecified: Secondary | ICD-10-CM | POA: Diagnosis not present

## 2021-11-12 DIAGNOSIS — Z20822 Contact with and (suspected) exposure to covid-19: Secondary | ICD-10-CM | POA: Diagnosis not present

## 2021-11-13 DIAGNOSIS — I5042 Chronic combined systolic (congestive) and diastolic (congestive) heart failure: Secondary | ICD-10-CM | POA: Diagnosis not present

## 2021-11-13 DIAGNOSIS — R634 Abnormal weight loss: Secondary | ICD-10-CM | POA: Diagnosis not present

## 2021-11-13 DIAGNOSIS — N179 Acute kidney failure, unspecified: Secondary | ICD-10-CM | POA: Diagnosis not present

## 2021-11-13 DIAGNOSIS — I110 Hypertensive heart disease with heart failure: Secondary | ICD-10-CM | POA: Diagnosis not present

## 2021-11-21 DIAGNOSIS — N179 Acute kidney failure, unspecified: Secondary | ICD-10-CM | POA: Diagnosis not present

## 2021-11-25 DIAGNOSIS — M6281 Muscle weakness (generalized): Secondary | ICD-10-CM | POA: Diagnosis not present

## 2021-11-27 DIAGNOSIS — M6281 Muscle weakness (generalized): Secondary | ICD-10-CM | POA: Diagnosis not present

## 2021-11-27 DIAGNOSIS — R635 Abnormal weight gain: Secondary | ICD-10-CM | POA: Diagnosis not present

## 2021-11-27 DIAGNOSIS — N179 Acute kidney failure, unspecified: Secondary | ICD-10-CM | POA: Diagnosis not present

## 2021-11-27 DIAGNOSIS — H00025 Hordeolum internum left lower eyelid: Secondary | ICD-10-CM | POA: Diagnosis not present

## 2021-12-04 DIAGNOSIS — F330 Major depressive disorder, recurrent, mild: Secondary | ICD-10-CM | POA: Diagnosis not present

## 2021-12-04 DIAGNOSIS — M6281 Muscle weakness (generalized): Secondary | ICD-10-CM | POA: Diagnosis not present

## 2021-12-04 DIAGNOSIS — I10 Essential (primary) hypertension: Secondary | ICD-10-CM | POA: Diagnosis not present

## 2021-12-04 DIAGNOSIS — M25551 Pain in right hip: Secondary | ICD-10-CM | POA: Diagnosis not present

## 2021-12-05 DIAGNOSIS — N179 Acute kidney failure, unspecified: Secondary | ICD-10-CM | POA: Diagnosis not present

## 2021-12-05 DIAGNOSIS — M6281 Muscle weakness (generalized): Secondary | ICD-10-CM | POA: Diagnosis not present

## 2021-12-09 DIAGNOSIS — M6281 Muscle weakness (generalized): Secondary | ICD-10-CM | POA: Diagnosis not present

## 2021-12-11 DIAGNOSIS — I110 Hypertensive heart disease with heart failure: Secondary | ICD-10-CM | POA: Diagnosis not present

## 2021-12-11 DIAGNOSIS — I5042 Chronic combined systolic (congestive) and diastolic (congestive) heart failure: Secondary | ICD-10-CM | POA: Diagnosis not present

## 2021-12-11 DIAGNOSIS — R7302 Impaired glucose tolerance (oral): Secondary | ICD-10-CM | POA: Diagnosis not present

## 2021-12-11 DIAGNOSIS — N179 Acute kidney failure, unspecified: Secondary | ICD-10-CM | POA: Diagnosis not present

## 2021-12-12 DIAGNOSIS — M6281 Muscle weakness (generalized): Secondary | ICD-10-CM | POA: Diagnosis not present

## 2021-12-19 DIAGNOSIS — Z20822 Contact with and (suspected) exposure to covid-19: Secondary | ICD-10-CM | POA: Diagnosis not present

## 2021-12-24 DIAGNOSIS — Z20822 Contact with and (suspected) exposure to covid-19: Secondary | ICD-10-CM | POA: Diagnosis not present

## 2021-12-25 DIAGNOSIS — K5909 Other constipation: Secondary | ICD-10-CM | POA: Diagnosis not present

## 2021-12-25 DIAGNOSIS — M25551 Pain in right hip: Secondary | ICD-10-CM | POA: Diagnosis not present

## 2021-12-25 DIAGNOSIS — Z79899 Other long term (current) drug therapy: Secondary | ICD-10-CM | POA: Diagnosis not present

## 2021-12-25 DIAGNOSIS — E038 Other specified hypothyroidism: Secondary | ICD-10-CM | POA: Diagnosis not present

## 2021-12-26 DIAGNOSIS — Z20822 Contact with and (suspected) exposure to covid-19: Secondary | ICD-10-CM | POA: Diagnosis not present

## 2021-12-29 NOTE — Progress Notes (Deleted)
Cardiology Office Note   Date:  12/29/2021   ID:  Cindy [name], DOB 1930/03/07, MRN [id number]  PCP:  Clovia Cuff, MD  Cardiologist:   Virgil Slinger [place], MD   No chief complaint on file.     History of Present Illness: Cindy [name] is a 88 y.o. female who is seen at the request of Dr Inda Merlin for evaluation of new LV dysfunction.  She has a history of HTN and HLD. She was seen in 2018 by Dr Irish Lack. She had an elevated troponin level in setting of sepsis felt to be due to demand ischemia. Echo was normal at that time. She was admitted in July 2021 with a mechanical fall. Suffered an acetabular fracture. Based on patient's advanced age, poor bone quality and limited ability to achieve and maintain anatomic reduction, nonoperative management is recommended. She had AKI with creatinine up to 2.4 that improved to 1.29.  Given poor prognosis based on age, dementia and bedbound status she was referred to Palliative care. She was readmitted 8/7-07/30/20 with acute PE. CTA chest in the ER and was found to have bilateral pulmonary emboli involving the right middle and lobar arteries as well as the right and left upper lobes (segmental branches). She underwent echo on admission as well which revealed EF 30 to 35%, no RWMA, grade 2 diastolic dysfunction, moderately elevated PA pressure.  RV size normal, normal RV systolic function.  No obvious heart strain. LE duplex also showed age indeterminate deep vein thrombosis involving a single right peroneal vein. She was started on heparin and then transitioned to Eliquis. Troponin was elevated to 5200 and BNP to 970.   Repeat Echo in May 2022 showed persistently low EF 30-35%.   On follow up today she is seen with her son. She complains of nausea that has been persistent even before her hospital stay. She denies any chest pain, dyspnea, edema or palpiations. She is still non weight bearing on her right leg. Seen in a wheelchair.     Past Medical  History:  Diagnosis Date   Arthritis    Cancer ([place])    skin   Colon cancer ([place]) 08/31/13   invasive squamous cell   Deaf, left    Depression    GERD (gastroesophageal reflux disease)    History of radiation therapy 10/10/13-11/23/13   anal canal 54GY/   Hyperlipemia    Hypertension    Hypothyroidism    PONV (postoperative nausea and vomiting)    Skin cancer    Shingles 2012 Bad case    Past Surgical History:  Procedure Laterality Date   ABDOMINAL HYSTERECTOMY     APPENDECTOMY     CESAREAN SECTION     2   CHOLECYSTECTOMY     COLONOSCOPY  08/31/13   invasive squamous cell colon   ERCP     ESOPHAGOGASTRODUODENOSCOPY  10/20/2012   Procedure: ESOPHAGOGASTRODUODENOSCOPY (EGD);  Surgeon: Arta Silence, MD;  Location: [facility];  Service: Endoscopy;  Laterality: Left;   EYE SURGERY     cataracts   KNEE ARTHROSCOPY  05/11/2012   Procedure: ARTHROSCOPY KNEE;  Surgeon: Hessie Dibble, MD;  Location: [facility];  Service: Orthopedics;  Laterality: Right;  left knee medial menisectomy and chondroplasty   THYROIDECTOMY, PARTIAL     TONSILLECTOMY       Current Outpatient Medications  Medication Sig Dispense Refill   acetaminophen (TYLENOL) 500 MG tablet Take 1,000 mg by mouth every 4 (four) hours as needed for  mild pain, headache or fever.     ALPRAZolam (XANAX) 1 MG tablet Take 1 tablet (1 mg total) by mouth daily as needed for anxiety. 4 tablet 0   aspirin EC 81 MG EC tablet Take 1 tablet (81 mg total) by mouth daily. Swallow whole. 30 tablet 11   buPROPion (WELLBUTRIN SR) 200 MG 12 hr tablet Take 200 mg by mouth daily.      busPIRone (BUSPAR) 5 MG tablet Take 5 mg by mouth 3 (three) times daily.     carvedilol (COREG) 6.25 MG tablet Take 1 tablet (6.25 mg total) by mouth 2 (two) times daily. 180 tablet 3   docusate sodium (COLACE) 100 MG capsule Take 100 mg by mouth 2 (two) times daily.     escitalopram (LEXAPRO) 20 MG tablet Take 20 mg by mouth daily.      furosemide (LASIX) 20 MG tablet Take 20 mg daily if needed 90 tablet 3   gabapentin (NEURONTIN) 300 MG capsule Take 300 mg by mouth at bedtime.     levothyroxine (SYNTHROID) 100 MCG tablet Take 100 mcg by mouth daily.     loperamide (IMODIUM) 2 MG capsule Take 2 mg by mouth 4 (four) times daily as needed for diarrhea or loose stools.     losartan (COZAAR) 50 MG tablet Take 50 mg by mouth daily.     ondansetron (ZOFRAN) 4 MG tablet Take 4 mg by mouth every 8 (eight) hours as needed for nausea/vomiting.     pantoprazole (PROTONIX) 40 MG tablet Take 40 mg by mouth daily.     polyethylene glycol (MIRALAX / GLYCOLAX) 17 g packet Take 17 g by mouth See admin instructions. 17g every other day as needed for constipation     tamsulosin (FLOMAX) 0.4 MG CAPS capsule Take 1 capsule (0.4 mg total) by mouth daily. 30 capsule 0   No current facility-administered medications for this visit.    Allergies:   Vancomycin, Zosyn [piperacillin sod-tazobactam so], Hydrocodone, and Morphine and related    Social History:  The patient  reports that she has never smoked. She has never used smokeless tobacco. She reports that she does not drink alcohol and does not use drugs.   Family History:  The patient's family history includes Cancer in her daughter, sister, sister, and sister; Heart attack in her father; Heart disease in her father; Kidney disease in her mother.    ROS:  Please see the history of present illness.   Otherwise, review of systems are positive for none.   All other systems are reviewed and negative.    PHYSICAL EXAM: VS:  There were no vitals taken for this visit. , BMI There is no height or weight on file to calculate BMI. GEN: Elderly WF, in no acute distress  HEENT: normal  Neck: no JVD, carotid bruits, or masses Cardiac: RRR; no murmurs, rubs, or gallops,no edema  Respiratory:  clear to auscultation bilaterally, normal work of breathing GI: soft, nontender, nondistended, + BS MS: no  deformity or atrophy  Skin: warm and dry, no rash Neuro:  Strength and sensation are intact Psych: euthymic mood, full affect   Recent Labs: 07/10/2021: ALT 18; B Natriuretic Peptide 3,066.5; BUN 19; Creatinine, Ser 1.28; Hemoglobin 9.1; Magnesium 1.9; Platelets 354; Potassium 3.0; Sodium 139    Lipid Panel    Component Value Date/Time   CHOL 161 07/10/2021 0131   TRIG 50 07/10/2021 0131   HDL 43 07/10/2021 0131   CHOLHDL 3.7 07/10/2021 0131  VLDL 10 07/10/2021 0131   LDLCALC 108 (H) 07/10/2021 0131     Dated 08/02/20: glucose 102, albumin 3.3. otherwise CMET normal.Hgb 8.1. B12, folate, ferritin normal.  Repeat Hgb on 8/23 was 9.9  Wt Readings from Last 3 Encounters:  07/08/21 140 lb (63.5 kg)  06/10/21 145 lb (65.8 kg)  05/08/21 136 lb 11 oz (62 kg)    Ecg dated 07/28/20 showed NSR with old anterior infarct. T wave inversion 1 and Avl. I have personally reviewed and interpreted this study.   Other studies Reviewed: Additional studies/ records that were reviewed today include:  Echo 04/28/17: Study Conclusions   - Left ventricle: The cavity size was normal. Systolic function was    normal. The estimated ejection fraction was in the range of 55%    to 60%. Wall motion was normal; there were no regional wall    motion abnormalities. Doppler parameters are consistent with    abnormal left ventricular relaxation (grade 1 diastolic    dysfunction). Doppler parameters are consistent with elevated    mean left atrial filling pressure.  - Aortic valve: There was trivial regurgitation.  - Mitral valve: There was mild regurgitation.  - Left atrium: The atrium was mildly dilated.   Echo 07/28/20: IMPRESSIONS     1. Left ventricular ejection fraction, by estimation, is 30-35%. The left  ventricle has mildly decreased function. The left ventricle has no  regional wall motion abnormalities. Left ventricular diastolic parameters  are consistent with Grade II diastolic   dysfunction  (pseudonormalization). Elevated left ventricular  end-diastolic pressure. There is possible akinesis of the left  ventricular, apical septal wall, apical segment, anterior wall and  inferolateral wall. There is akinesis of the left ventricular,   mid anteroseptal wall.   2. Right ventricular systolic function is normal. The right ventricular  size is normal. There is moderately elevated pulmonary artery systolic  pressure. The estimated right ventricular systolic pressure is 37.1 mmHg.   3. The mitral valve is normal in structure. Mild mitral valve  regurgitation. No evidence of mitral stenosis.   4. The aortic valve is normal in structure. Aortic valve regurgitation is  not visualized. Mild to moderate aortic valve sclerosis/calcification is  present, without any evidence of aortic stenosis.   5. The inferior vena cava is normal in size with greater than 50%  respiratory variability, suggesting right atrial pressure of 3 mmHg.   6. Definity contrast study has been ordered to confirm wall motion  abnormalities.    Echo 05/01/21: IMPRESSIONS     1. Severe hypokinesis of the anterior, septal and apical walls; overall  moderate to severe LV dysfunction.   2. Left ventricular ejection fraction, by estimation, is 30 to 35%. The  left ventricle has moderate to severely decreased function. The left  ventricle demonstrates regional wall motion abnormalities (see scoring  diagram/findings for description). Left  ventricular diastolic parameters are consistent with Grade II diastolic  dysfunction (pseudonormalization). Elevated left atrial pressure.   3. Right ventricular systolic function is normal. The right ventricular  size is normal. There is severely elevated pulmonary artery systolic  pressure.   4. Left atrial size was moderately dilated.   5. The mitral valve is abnormal. Mild mitral valve regurgitation. No  evidence of mitral stenosis.   6. The aortic valve is tricuspid. Aortic  valve regurgitation is trivial.  Mild aortic valve sclerosis is present, with no evidence of aortic valve  stenosis.   7. The inferior vena cava is normal  in size with greater than 50%  respiratory variability, suggesting right atrial pressure of 3 mmHg.   ASSESSMENT AND PLAN:  1.  Acute systolic CHF. EF dropped to 30-35% at hospitalization in 2021.  Troponin up to 5000. BNP elevated. New EF drop since 2018. This could be ischemic mediated or stress mediated cardiomyopathy. Doubt related solely to PE. Will start Coreg 6.25 mg bid. Start losartan 25 mg daily. Check BMET in 2 weeks. Will also check lipid status. Should probably be on a statin. Given her age and co-morbidities she is not a candidate for invasive evaluation. Repeat Echo showed persistently low EF 30-35% in May 2. HTN. Medication changes as noted.  3. Acetabular fracture 4. Recent pulmonary embolus. On eliquis. Favor 6 month course of anticoagulation  5. AKI in hospital - resolved.    Current medicines are reviewed at length with the patient today.  The patient does not have concerns regarding medicines.  The following changes have been made:  See above  Labs/ tests ordered today include: BMET and lipid panel in 2 weeks.     Disposition:   FU with APP in 4 weeks  Signed, Kayra Crowell [place], MD  12/29/2021 9:13 AM    [facility] [address], [place], [place], [address] Phone [phone], Fax [phone]

## 2021-12-31 DIAGNOSIS — Z20822 Contact with and (suspected) exposure to covid-19: Secondary | ICD-10-CM | POA: Diagnosis not present

## 2021-12-31 DIAGNOSIS — M6281 Muscle weakness (generalized): Secondary | ICD-10-CM | POA: Diagnosis not present

## 2022-01-01 DIAGNOSIS — M6281 Muscle weakness (generalized): Secondary | ICD-10-CM | POA: Diagnosis not present

## 2022-01-02 DIAGNOSIS — Z20822 Contact with and (suspected) exposure to covid-19: Secondary | ICD-10-CM | POA: Diagnosis not present

## 2022-01-04 DIAGNOSIS — M6281 Muscle weakness (generalized): Secondary | ICD-10-CM | POA: Diagnosis not present

## 2022-01-06 DIAGNOSIS — M6281 Muscle weakness (generalized): Secondary | ICD-10-CM | POA: Diagnosis not present

## 2022-01-07 DIAGNOSIS — Z20822 Contact with and (suspected) exposure to covid-19: Secondary | ICD-10-CM | POA: Diagnosis not present

## 2022-01-08 ENCOUNTER — Ambulatory Visit: Payer: No Typology Code available for payment source | Admitting: Cardiology

## 2022-01-08 DIAGNOSIS — F330 Major depressive disorder, recurrent, mild: Secondary | ICD-10-CM | POA: Diagnosis not present

## 2022-01-08 DIAGNOSIS — M6281 Muscle weakness (generalized): Secondary | ICD-10-CM | POA: Diagnosis not present

## 2022-01-08 DIAGNOSIS — M25551 Pain in right hip: Secondary | ICD-10-CM | POA: Diagnosis not present

## 2022-01-08 DIAGNOSIS — K219 Gastro-esophageal reflux disease without esophagitis: Secondary | ICD-10-CM | POA: Diagnosis not present

## 2022-01-17 DIAGNOSIS — M6281 Muscle weakness (generalized): Secondary | ICD-10-CM | POA: Diagnosis not present

## 2022-01-20 DIAGNOSIS — M6281 Muscle weakness (generalized): Secondary | ICD-10-CM | POA: Diagnosis not present

## 2022-01-23 DIAGNOSIS — Z79899 Other long term (current) drug therapy: Secondary | ICD-10-CM | POA: Diagnosis not present

## 2022-01-23 DIAGNOSIS — E039 Hypothyroidism, unspecified: Secondary | ICD-10-CM | POA: Diagnosis not present

## 2022-01-27 DIAGNOSIS — Z79899 Other long term (current) drug therapy: Secondary | ICD-10-CM | POA: Diagnosis not present

## 2022-01-27 DIAGNOSIS — E039 Hypothyroidism, unspecified: Secondary | ICD-10-CM | POA: Diagnosis not present

## 2022-01-29 DIAGNOSIS — D7589 Other specified diseases of blood and blood-forming organs: Secondary | ICD-10-CM | POA: Diagnosis not present

## 2022-01-29 DIAGNOSIS — I130 Hypertensive heart and chronic kidney disease with heart failure and stage 1 through stage 4 chronic kidney disease, or unspecified chronic kidney disease: Secondary | ICD-10-CM | POA: Diagnosis not present

## 2022-01-29 DIAGNOSIS — M25551 Pain in right hip: Secondary | ICD-10-CM | POA: Diagnosis not present

## 2022-01-29 DIAGNOSIS — M6281 Muscle weakness (generalized): Secondary | ICD-10-CM | POA: Diagnosis not present

## 2022-01-29 DIAGNOSIS — N1831 Chronic kidney disease, stage 3a: Secondary | ICD-10-CM | POA: Diagnosis not present

## 2022-01-29 DIAGNOSIS — E038 Other specified hypothyroidism: Secondary | ICD-10-CM | POA: Diagnosis not present

## 2022-01-31 DIAGNOSIS — M6281 Muscle weakness (generalized): Secondary | ICD-10-CM | POA: Diagnosis not present

## 2022-02-18 DIAGNOSIS — M6281 Muscle weakness (generalized): Secondary | ICD-10-CM | POA: Diagnosis not present

## 2022-02-18 DIAGNOSIS — R2689 Other abnormalities of gait and mobility: Secondary | ICD-10-CM | POA: Diagnosis not present

## 2022-02-19 DIAGNOSIS — R2689 Other abnormalities of gait and mobility: Secondary | ICD-10-CM | POA: Diagnosis not present

## 2022-02-19 DIAGNOSIS — M6281 Muscle weakness (generalized): Secondary | ICD-10-CM | POA: Diagnosis not present

## 2022-02-20 DIAGNOSIS — M6281 Muscle weakness (generalized): Secondary | ICD-10-CM | POA: Diagnosis not present

## 2022-02-20 DIAGNOSIS — R2689 Other abnormalities of gait and mobility: Secondary | ICD-10-CM | POA: Diagnosis not present

## 2022-02-21 DIAGNOSIS — R2689 Other abnormalities of gait and mobility: Secondary | ICD-10-CM | POA: Diagnosis not present

## 2022-02-21 DIAGNOSIS — M6281 Muscle weakness (generalized): Secondary | ICD-10-CM | POA: Diagnosis not present

## 2022-02-24 DIAGNOSIS — R2689 Other abnormalities of gait and mobility: Secondary | ICD-10-CM | POA: Diagnosis not present

## 2022-02-24 DIAGNOSIS — M6281 Muscle weakness (generalized): Secondary | ICD-10-CM | POA: Diagnosis not present

## 2022-02-26 DIAGNOSIS — M6281 Muscle weakness (generalized): Secondary | ICD-10-CM | POA: Diagnosis not present

## 2022-02-26 DIAGNOSIS — R2689 Other abnormalities of gait and mobility: Secondary | ICD-10-CM | POA: Diagnosis not present

## 2022-02-27 DIAGNOSIS — M6281 Muscle weakness (generalized): Secondary | ICD-10-CM | POA: Diagnosis not present

## 2022-02-27 DIAGNOSIS — R2689 Other abnormalities of gait and mobility: Secondary | ICD-10-CM | POA: Diagnosis not present

## 2022-03-03 DIAGNOSIS — M6281 Muscle weakness (generalized): Secondary | ICD-10-CM | POA: Diagnosis not present

## 2022-03-03 DIAGNOSIS — R2689 Other abnormalities of gait and mobility: Secondary | ICD-10-CM | POA: Diagnosis not present

## 2022-03-03 DIAGNOSIS — T280XXA Burn of mouth and pharynx, initial encounter: Secondary | ICD-10-CM | POA: Diagnosis not present

## 2022-03-03 DIAGNOSIS — I5042 Chronic combined systolic (congestive) and diastolic (congestive) heart failure: Secondary | ICD-10-CM | POA: Diagnosis not present

## 2022-03-03 DIAGNOSIS — I10 Essential (primary) hypertension: Secondary | ICD-10-CM | POA: Diagnosis not present

## 2022-03-05 DIAGNOSIS — M6281 Muscle weakness (generalized): Secondary | ICD-10-CM | POA: Diagnosis not present

## 2022-03-05 DIAGNOSIS — R2689 Other abnormalities of gait and mobility: Secondary | ICD-10-CM | POA: Diagnosis not present

## 2022-03-07 DIAGNOSIS — M6281 Muscle weakness (generalized): Secondary | ICD-10-CM | POA: Diagnosis not present

## 2022-03-07 DIAGNOSIS — R2689 Other abnormalities of gait and mobility: Secondary | ICD-10-CM | POA: Diagnosis not present

## 2022-03-10 DIAGNOSIS — R2689 Other abnormalities of gait and mobility: Secondary | ICD-10-CM | POA: Diagnosis not present

## 2022-03-10 DIAGNOSIS — M6281 Muscle weakness (generalized): Secondary | ICD-10-CM | POA: Diagnosis not present

## 2022-03-12 DIAGNOSIS — R2689 Other abnormalities of gait and mobility: Secondary | ICD-10-CM | POA: Diagnosis not present

## 2022-03-12 DIAGNOSIS — M6281 Muscle weakness (generalized): Secondary | ICD-10-CM | POA: Diagnosis not present

## 2022-03-14 DIAGNOSIS — M6281 Muscle weakness (generalized): Secondary | ICD-10-CM | POA: Diagnosis not present

## 2022-03-14 DIAGNOSIS — R2689 Other abnormalities of gait and mobility: Secondary | ICD-10-CM | POA: Diagnosis not present

## 2022-03-17 DIAGNOSIS — R2689 Other abnormalities of gait and mobility: Secondary | ICD-10-CM | POA: Diagnosis not present

## 2022-03-17 DIAGNOSIS — M6281 Muscle weakness (generalized): Secondary | ICD-10-CM | POA: Diagnosis not present

## 2022-03-18 DIAGNOSIS — M6281 Muscle weakness (generalized): Secondary | ICD-10-CM | POA: Diagnosis not present

## 2022-03-18 DIAGNOSIS — R2689 Other abnormalities of gait and mobility: Secondary | ICD-10-CM | POA: Diagnosis not present

## 2022-03-19 DIAGNOSIS — M6281 Muscle weakness (generalized): Secondary | ICD-10-CM | POA: Diagnosis not present

## 2022-03-19 DIAGNOSIS — R2689 Other abnormalities of gait and mobility: Secondary | ICD-10-CM | POA: Diagnosis not present

## 2022-03-20 DIAGNOSIS — E039 Hypothyroidism, unspecified: Secondary | ICD-10-CM | POA: Diagnosis not present

## 2022-03-31 DIAGNOSIS — I10 Essential (primary) hypertension: Secondary | ICD-10-CM | POA: Diagnosis not present

## 2022-03-31 DIAGNOSIS — R296 Repeated falls: Secondary | ICD-10-CM | POA: Diagnosis not present

## 2022-03-31 DIAGNOSIS — T280XXD Burn of mouth and pharynx, subsequent encounter: Secondary | ICD-10-CM | POA: Diagnosis not present

## 2022-03-31 DIAGNOSIS — I5042 Chronic combined systolic (congestive) and diastolic (congestive) heart failure: Secondary | ICD-10-CM | POA: Diagnosis not present

## 2022-04-01 DIAGNOSIS — I130 Hypertensive heart and chronic kidney disease with heart failure and stage 1 through stage 4 chronic kidney disease, or unspecified chronic kidney disease: Secondary | ICD-10-CM | POA: Diagnosis not present

## 2022-04-01 DIAGNOSIS — R2689 Other abnormalities of gait and mobility: Secondary | ICD-10-CM | POA: Diagnosis not present

## 2022-04-01 DIAGNOSIS — I5032 Chronic diastolic (congestive) heart failure: Secondary | ICD-10-CM | POA: Diagnosis not present

## 2022-04-01 DIAGNOSIS — F331 Major depressive disorder, recurrent, moderate: Secondary | ICD-10-CM | POA: Diagnosis not present

## 2022-04-01 DIAGNOSIS — E038 Other specified hypothyroidism: Secondary | ICD-10-CM | POA: Diagnosis not present

## 2022-04-01 DIAGNOSIS — M6281 Muscle weakness (generalized): Secondary | ICD-10-CM | POA: Diagnosis not present

## 2022-04-04 DIAGNOSIS — R2689 Other abnormalities of gait and mobility: Secondary | ICD-10-CM | POA: Diagnosis not present

## 2022-04-04 DIAGNOSIS — M6281 Muscle weakness (generalized): Secondary | ICD-10-CM | POA: Diagnosis not present

## 2022-04-06 DIAGNOSIS — R2689 Other abnormalities of gait and mobility: Secondary | ICD-10-CM | POA: Diagnosis not present

## 2022-04-06 DIAGNOSIS — M6281 Muscle weakness (generalized): Secondary | ICD-10-CM | POA: Diagnosis not present

## 2022-04-07 DIAGNOSIS — M130 Polyarthritis, unspecified: Secondary | ICD-10-CM | POA: Diagnosis not present

## 2022-04-07 DIAGNOSIS — M25551 Pain in right hip: Secondary | ICD-10-CM | POA: Diagnosis not present

## 2022-04-07 DIAGNOSIS — Z20822 Contact with and (suspected) exposure to covid-19: Secondary | ICD-10-CM | POA: Diagnosis not present

## 2022-04-08 DIAGNOSIS — R2689 Other abnormalities of gait and mobility: Secondary | ICD-10-CM | POA: Diagnosis not present

## 2022-04-08 DIAGNOSIS — M6281 Muscle weakness (generalized): Secondary | ICD-10-CM | POA: Diagnosis not present

## 2022-04-10 DIAGNOSIS — R2689 Other abnormalities of gait and mobility: Secondary | ICD-10-CM | POA: Diagnosis not present

## 2022-04-10 DIAGNOSIS — M6281 Muscle weakness (generalized): Secondary | ICD-10-CM | POA: Diagnosis not present

## 2022-04-14 DIAGNOSIS — M6281 Muscle weakness (generalized): Secondary | ICD-10-CM | POA: Diagnosis not present

## 2022-04-14 DIAGNOSIS — R2689 Other abnormalities of gait and mobility: Secondary | ICD-10-CM | POA: Diagnosis not present

## 2022-04-15 DIAGNOSIS — R2689 Other abnormalities of gait and mobility: Secondary | ICD-10-CM | POA: Diagnosis not present

## 2022-04-15 DIAGNOSIS — M6281 Muscle weakness (generalized): Secondary | ICD-10-CM | POA: Diagnosis not present

## 2022-04-17 DIAGNOSIS — R2689 Other abnormalities of gait and mobility: Secondary | ICD-10-CM | POA: Diagnosis not present

## 2022-04-17 DIAGNOSIS — M6281 Muscle weakness (generalized): Secondary | ICD-10-CM | POA: Diagnosis not present

## 2022-04-21 DIAGNOSIS — M6281 Muscle weakness (generalized): Secondary | ICD-10-CM | POA: Diagnosis not present

## 2022-04-21 DIAGNOSIS — R2689 Other abnormalities of gait and mobility: Secondary | ICD-10-CM | POA: Diagnosis not present

## 2022-04-21 DIAGNOSIS — R488 Other symbolic dysfunctions: Secondary | ICD-10-CM | POA: Diagnosis not present

## 2022-04-21 DIAGNOSIS — R41841 Cognitive communication deficit: Secondary | ICD-10-CM | POA: Diagnosis not present

## 2022-04-23 DIAGNOSIS — R2689 Other abnormalities of gait and mobility: Secondary | ICD-10-CM | POA: Diagnosis not present

## 2022-04-23 DIAGNOSIS — R488 Other symbolic dysfunctions: Secondary | ICD-10-CM | POA: Diagnosis not present

## 2022-04-23 DIAGNOSIS — M6281 Muscle weakness (generalized): Secondary | ICD-10-CM | POA: Diagnosis not present

## 2022-04-23 DIAGNOSIS — R41841 Cognitive communication deficit: Secondary | ICD-10-CM | POA: Diagnosis not present

## 2022-04-24 DIAGNOSIS — M6281 Muscle weakness (generalized): Secondary | ICD-10-CM | POA: Diagnosis not present

## 2022-04-24 DIAGNOSIS — R488 Other symbolic dysfunctions: Secondary | ICD-10-CM | POA: Diagnosis not present

## 2022-04-24 DIAGNOSIS — R2689 Other abnormalities of gait and mobility: Secondary | ICD-10-CM | POA: Diagnosis not present

## 2022-04-24 DIAGNOSIS — R41841 Cognitive communication deficit: Secondary | ICD-10-CM | POA: Diagnosis not present

## 2022-04-28 DIAGNOSIS — R488 Other symbolic dysfunctions: Secondary | ICD-10-CM | POA: Diagnosis not present

## 2022-04-28 DIAGNOSIS — R300 Dysuria: Secondary | ICD-10-CM | POA: Diagnosis not present

## 2022-04-28 DIAGNOSIS — R41841 Cognitive communication deficit: Secondary | ICD-10-CM | POA: Diagnosis not present

## 2022-04-28 DIAGNOSIS — M6281 Muscle weakness (generalized): Secondary | ICD-10-CM | POA: Diagnosis not present

## 2022-04-28 DIAGNOSIS — R2689 Other abnormalities of gait and mobility: Secondary | ICD-10-CM | POA: Diagnosis not present

## 2022-04-30 DIAGNOSIS — R2689 Other abnormalities of gait and mobility: Secondary | ICD-10-CM | POA: Diagnosis not present

## 2022-04-30 DIAGNOSIS — R41841 Cognitive communication deficit: Secondary | ICD-10-CM | POA: Diagnosis not present

## 2022-04-30 DIAGNOSIS — R488 Other symbolic dysfunctions: Secondary | ICD-10-CM | POA: Diagnosis not present

## 2022-04-30 DIAGNOSIS — M6281 Muscle weakness (generalized): Secondary | ICD-10-CM | POA: Diagnosis not present

## 2022-05-03 DIAGNOSIS — M6281 Muscle weakness (generalized): Secondary | ICD-10-CM | POA: Diagnosis not present

## 2022-05-03 DIAGNOSIS — R41841 Cognitive communication deficit: Secondary | ICD-10-CM | POA: Diagnosis not present

## 2022-05-03 DIAGNOSIS — R488 Other symbolic dysfunctions: Secondary | ICD-10-CM | POA: Diagnosis not present

## 2022-05-03 DIAGNOSIS — R2689 Other abnormalities of gait and mobility: Secondary | ICD-10-CM | POA: Diagnosis not present

## 2022-05-05 DIAGNOSIS — N390 Urinary tract infection, site not specified: Secondary | ICD-10-CM | POA: Diagnosis not present

## 2022-05-06 DIAGNOSIS — R2689 Other abnormalities of gait and mobility: Secondary | ICD-10-CM | POA: Diagnosis not present

## 2022-05-06 DIAGNOSIS — R41841 Cognitive communication deficit: Secondary | ICD-10-CM | POA: Diagnosis not present

## 2022-05-06 DIAGNOSIS — M6281 Muscle weakness (generalized): Secondary | ICD-10-CM | POA: Diagnosis not present

## 2022-05-06 DIAGNOSIS — R488 Other symbolic dysfunctions: Secondary | ICD-10-CM | POA: Diagnosis not present

## 2022-05-09 DIAGNOSIS — R41841 Cognitive communication deficit: Secondary | ICD-10-CM | POA: Diagnosis not present

## 2022-05-09 DIAGNOSIS — M6281 Muscle weakness (generalized): Secondary | ICD-10-CM | POA: Diagnosis not present

## 2022-05-09 DIAGNOSIS — R2689 Other abnormalities of gait and mobility: Secondary | ICD-10-CM | POA: Diagnosis not present

## 2022-05-09 DIAGNOSIS — R488 Other symbolic dysfunctions: Secondary | ICD-10-CM | POA: Diagnosis not present

## 2022-05-12 DIAGNOSIS — R488 Other symbolic dysfunctions: Secondary | ICD-10-CM | POA: Diagnosis not present

## 2022-05-12 DIAGNOSIS — R2689 Other abnormalities of gait and mobility: Secondary | ICD-10-CM | POA: Diagnosis not present

## 2022-05-12 DIAGNOSIS — R41841 Cognitive communication deficit: Secondary | ICD-10-CM | POA: Diagnosis not present

## 2022-05-12 DIAGNOSIS — M6281 Muscle weakness (generalized): Secondary | ICD-10-CM | POA: Diagnosis not present

## 2022-05-14 DIAGNOSIS — M6281 Muscle weakness (generalized): Secondary | ICD-10-CM | POA: Diagnosis not present

## 2022-05-14 DIAGNOSIS — R2689 Other abnormalities of gait and mobility: Secondary | ICD-10-CM | POA: Diagnosis not present

## 2022-05-14 DIAGNOSIS — R488 Other symbolic dysfunctions: Secondary | ICD-10-CM | POA: Diagnosis not present

## 2022-05-14 DIAGNOSIS — R41841 Cognitive communication deficit: Secondary | ICD-10-CM | POA: Diagnosis not present

## 2022-05-15 DIAGNOSIS — M130 Polyarthritis, unspecified: Secondary | ICD-10-CM | POA: Diagnosis not present

## 2022-05-15 DIAGNOSIS — R2689 Other abnormalities of gait and mobility: Secondary | ICD-10-CM | POA: Diagnosis not present

## 2022-05-15 DIAGNOSIS — M6281 Muscle weakness (generalized): Secondary | ICD-10-CM | POA: Diagnosis not present

## 2022-05-15 DIAGNOSIS — R488 Other symbolic dysfunctions: Secondary | ICD-10-CM | POA: Diagnosis not present

## 2022-05-15 DIAGNOSIS — R4182 Altered mental status, unspecified: Secondary | ICD-10-CM | POA: Diagnosis not present

## 2022-05-15 DIAGNOSIS — R41841 Cognitive communication deficit: Secondary | ICD-10-CM | POA: Diagnosis not present

## 2022-05-15 DIAGNOSIS — M25551 Pain in right hip: Secondary | ICD-10-CM | POA: Diagnosis not present

## 2022-05-16 DIAGNOSIS — M6281 Muscle weakness (generalized): Secondary | ICD-10-CM | POA: Diagnosis not present

## 2022-05-16 DIAGNOSIS — R41841 Cognitive communication deficit: Secondary | ICD-10-CM | POA: Diagnosis not present

## 2022-05-16 DIAGNOSIS — R488 Other symbolic dysfunctions: Secondary | ICD-10-CM | POA: Diagnosis not present

## 2022-05-16 DIAGNOSIS — R2689 Other abnormalities of gait and mobility: Secondary | ICD-10-CM | POA: Diagnosis not present

## 2022-05-19 DIAGNOSIS — R41841 Cognitive communication deficit: Secondary | ICD-10-CM | POA: Diagnosis not present

## 2022-05-19 DIAGNOSIS — R488 Other symbolic dysfunctions: Secondary | ICD-10-CM | POA: Diagnosis not present

## 2022-05-19 DIAGNOSIS — M6281 Muscle weakness (generalized): Secondary | ICD-10-CM | POA: Diagnosis not present

## 2022-05-19 DIAGNOSIS — R2689 Other abnormalities of gait and mobility: Secondary | ICD-10-CM | POA: Diagnosis not present

## 2022-05-20 DIAGNOSIS — M6281 Muscle weakness (generalized): Secondary | ICD-10-CM | POA: Diagnosis not present

## 2022-05-20 DIAGNOSIS — R41841 Cognitive communication deficit: Secondary | ICD-10-CM | POA: Diagnosis not present

## 2022-05-20 DIAGNOSIS — R2689 Other abnormalities of gait and mobility: Secondary | ICD-10-CM | POA: Diagnosis not present

## 2022-05-20 DIAGNOSIS — R488 Other symbolic dysfunctions: Secondary | ICD-10-CM | POA: Diagnosis not present

## 2022-05-21 DIAGNOSIS — R2689 Other abnormalities of gait and mobility: Secondary | ICD-10-CM | POA: Diagnosis not present

## 2022-05-21 DIAGNOSIS — R488 Other symbolic dysfunctions: Secondary | ICD-10-CM | POA: Diagnosis not present

## 2022-05-21 DIAGNOSIS — R41841 Cognitive communication deficit: Secondary | ICD-10-CM | POA: Diagnosis not present

## 2022-05-21 DIAGNOSIS — M6281 Muscle weakness (generalized): Secondary | ICD-10-CM | POA: Diagnosis not present

## 2022-05-22 DIAGNOSIS — R2689 Other abnormalities of gait and mobility: Secondary | ICD-10-CM | POA: Diagnosis not present

## 2022-05-22 DIAGNOSIS — M6281 Muscle weakness (generalized): Secondary | ICD-10-CM | POA: Diagnosis not present

## 2022-05-22 DIAGNOSIS — R488 Other symbolic dysfunctions: Secondary | ICD-10-CM | POA: Diagnosis not present

## 2022-05-22 DIAGNOSIS — R41841 Cognitive communication deficit: Secondary | ICD-10-CM | POA: Diagnosis not present

## 2022-05-26 DIAGNOSIS — R41841 Cognitive communication deficit: Secondary | ICD-10-CM | POA: Diagnosis not present

## 2022-05-26 DIAGNOSIS — R488 Other symbolic dysfunctions: Secondary | ICD-10-CM | POA: Diagnosis not present

## 2022-05-26 DIAGNOSIS — M6281 Muscle weakness (generalized): Secondary | ICD-10-CM | POA: Diagnosis not present

## 2022-05-26 DIAGNOSIS — R2689 Other abnormalities of gait and mobility: Secondary | ICD-10-CM | POA: Diagnosis not present

## 2022-05-27 DIAGNOSIS — R41841 Cognitive communication deficit: Secondary | ICD-10-CM | POA: Diagnosis not present

## 2022-05-27 DIAGNOSIS — R2689 Other abnormalities of gait and mobility: Secondary | ICD-10-CM | POA: Diagnosis not present

## 2022-05-27 DIAGNOSIS — M6281 Muscle weakness (generalized): Secondary | ICD-10-CM | POA: Diagnosis not present

## 2022-05-27 DIAGNOSIS — R488 Other symbolic dysfunctions: Secondary | ICD-10-CM | POA: Diagnosis not present

## 2022-05-28 DIAGNOSIS — R488 Other symbolic dysfunctions: Secondary | ICD-10-CM | POA: Diagnosis not present

## 2022-05-28 DIAGNOSIS — R41841 Cognitive communication deficit: Secondary | ICD-10-CM | POA: Diagnosis not present

## 2022-05-28 DIAGNOSIS — M6281 Muscle weakness (generalized): Secondary | ICD-10-CM | POA: Diagnosis not present

## 2022-05-28 DIAGNOSIS — R2689 Other abnormalities of gait and mobility: Secondary | ICD-10-CM | POA: Diagnosis not present

## 2022-05-31 ENCOUNTER — Telehealth (HOSPITAL_BASED_OUTPATIENT_CLINIC_OR_DEPARTMENT_OTHER): Payer: Self-pay | Admitting: Emergency Medicine

## 2022-05-31 ENCOUNTER — Emergency Department (HOSPITAL_BASED_OUTPATIENT_CLINIC_OR_DEPARTMENT_OTHER)
Admission: EM | Admit: 2022-05-31 | Discharge: 2022-05-31 | Disposition: A | Discharge status: Home / Self Care | Payer: Medicare Other | Attending: Emergency Medicine | Admitting: Emergency Medicine

## 2022-05-31 ENCOUNTER — Encounter (HOSPITAL_BASED_OUTPATIENT_CLINIC_OR_DEPARTMENT_OTHER): Payer: Self-pay | Admitting: *Deleted

## 2022-05-31 ENCOUNTER — Other Ambulatory Visit: Payer: Self-pay

## 2022-05-31 DIAGNOSIS — N390 Urinary tract infection, site not specified: Secondary | ICD-10-CM | POA: Insufficient documentation

## 2022-05-31 DIAGNOSIS — Z7982 Long term (current) use of aspirin: Secondary | ICD-10-CM | POA: Diagnosis not present

## 2022-05-31 DIAGNOSIS — R300 Dysuria: Secondary | ICD-10-CM | POA: Diagnosis present

## 2022-05-31 DIAGNOSIS — Z79899 Other long term (current) drug therapy: Secondary | ICD-10-CM | POA: Insufficient documentation

## 2022-05-31 DIAGNOSIS — R7989 Other specified abnormal findings of blood chemistry: Secondary | ICD-10-CM | POA: Diagnosis not present

## 2022-05-31 LAB — CBC WITH DIFFERENTIAL/PLATELET
Abs Immature Granulocytes: 0.02 10*3/uL (ref 0.00–0.07)
Basophils Absolute: 0.1 10*3/uL (ref 0.0–0.1)
Basophils Relative: 1 %
Eosinophils Absolute: 0.6 10*3/uL — ABNORMAL HIGH (ref 0.0–0.5)
Eosinophils Relative: 10 %
HCT: 31.8 % — ABNORMAL LOW (ref 36.0–46.0)
Hemoglobin: 10.1 g/dL — ABNORMAL LOW (ref 12.0–15.0)
Immature Granulocytes: 0 %
Lymphocytes Relative: 28 %
Lymphs Abs: 1.6 10*3/uL (ref 0.7–4.0)
MCH: 33 pg (ref 26.0–34.0)
MCHC: 31.8 g/dL (ref 30.0–36.0)
MCV: 103.9 fL — ABNORMAL HIGH (ref 80.0–100.0)
Monocytes Absolute: 0.6 10*3/uL (ref 0.1–1.0)
Monocytes Relative: 10 %
Neutro Abs: 2.9 10*3/uL (ref 1.7–7.7)
Neutrophils Relative %: 51 %
Platelets: 258 10*3/uL (ref 150–400)
RBC: 3.06 MIL/uL — ABNORMAL LOW (ref 3.87–5.11)
RDW: 13.1 % (ref 11.5–15.5)
WBC: 5.9 10*3/uL (ref 4.0–10.5)
nRBC: 0 % (ref 0.0–0.2)

## 2022-05-31 LAB — BASIC METABOLIC PANEL
Anion gap: 12 (ref 5–15)
BUN: 14 mg/dL (ref 8–23)
CO2: 21 mmol/L — ABNORMAL LOW (ref 22–32)
Calcium: 9.3 mg/dL (ref 8.9–10.3)
Chloride: 106 mmol/L (ref 98–111)
Creatinine, Ser: 1.28 mg/dL — ABNORMAL HIGH (ref 0.44–1.00)
GFR, Estimated: 40 mL/min — ABNORMAL LOW (ref >60)
Glucose, Bld: 101 mg/dL — ABNORMAL HIGH (ref 70–99)
Potassium: 3.6 mmol/L (ref 3.5–5.1)
Sodium: 139 mmol/L (ref 135–145)

## 2022-05-31 LAB — URINALYSIS, ROUTINE W REFLEX MICROSCOPIC
Bilirubin Urine: NEGATIVE
Glucose, UA: NEGATIVE mg/dL
Ketones, ur: NEGATIVE mg/dL
Nitrite: NEGATIVE
Protein, ur: >300 mg/dL — AB
Specific Gravity, Urine: 1.021 (ref 1.005–1.030)
WBC, UA: >50 WBC/hpf — ABNORMAL HIGH (ref 0–5)
pH: 6 (ref 5.0–8.0)

## 2022-05-31 MED ORDER — PHENAZOPYRIDINE HCL 200 MG PO TABS: 200.0000 mg | ORAL_TABLET | Freq: Three times a day (TID) | ORAL | 0 refills | Status: DC

## 2022-05-31 MED ORDER — CEPHALEXIN 500 MG PO CAPS: 500.0000 mg | ORAL_CAPSULE | Freq: Two times a day (BID) | ORAL | 0 refills | Status: DC

## 2022-05-31 MED ORDER — SODIUM CHLORIDE 0.9 % IV SOLN
1.0000 g | Freq: Once | INTRAVENOUS | Status: AC
  Administered 2022-05-31: 1 g via INTRAVENOUS
  Filled 2022-05-31: qty 10

## 2022-05-31 MED ORDER — SODIUM CHLORIDE 0.9 % IV BOLUS
1000.0000 mL | Freq: Once | INTRAVENOUS | Status: AC
  Administered 2022-05-31: 1000 mL via INTRAVENOUS

## 2022-05-31 NOTE — ED Notes (Signed)
Patient assisted to bedside commode, 2 person, was able to urinate. States that it was very uncomfortable but this is how it has been for over a month.

## 2022-05-31 NOTE — Discharge Instructions (Signed)
You are seen in the emergency department for 1 month of burning when he urinated.  Your urine showed signs of infection.  You were given an IV dose of antibiotics and a prescription for 1 week of antibiotics and some medication to help with the burning.  Please follow-up with your primary care doctor.  Keep well-hydrated.  Return to the emergency department if any worsening or concerning symptoms.

## 2022-05-31 NOTE — ED Notes (Signed)
Chrys Racer RN with [facility] called regarding antibiotic question. Pt currently prescribed mabrobid 100 mg x10 days. Last dose tomorrow. During todays ED visit at [facility] pt was prescribed keflex. Clarified with EDP Melina Copa who carried for this pt during this encounter. Per EDP Melina Copa [facility] to discontinue last dose of Macrobid and start keflex. No other change to AVS.

## 2022-05-31 NOTE — ED Provider Notes (Signed)
[facility] EMERGENCY DEPT Provider Note   CSN: [id number] Arrival date & time: 05/31/22  1812     History  Chief Complaint  Patient presents with   Recurrent UTI    Cindy [name] is a 88 y.o. female.  She lives in assisted living.  She is brought in by her daughter states she has had a urinary tract infection for over a month.  Her doctor got a urine sample and put her on Macrobid without any improvement.  She has been having dysuria and today states she can even get any urine out.  No fevers chills nausea vomiting or abdominal pain.  She has a history of polyneuropathy and uses a wheelchair at baseline.  The history is provided by the patient and a relative.  Dysuria Pain quality:  Burning Pain severity:  Moderate Onset quality:  Gradual Duration:  1 month Timing:  Intermittent Progression:  Unchanged Chronicity:  Recurrent Recent urinary tract infections: yes   Relieved by:  Nothing Worsened by:  Nothing Ineffective treatments:  Antibiotics Urinary symptoms: frequent urination   Associated symptoms: no abdominal pain, no fever, no nausea and no vomiting   Risk factors: recurrent urinary tract infections        Home Medications Prior to Admission medications   Medication Sig Start Date End Date Taking? Authorizing Provider  acetaminophen (TYLENOL) 500 MG tablet Take 1,000 mg by mouth every 4 (four) hours as needed for mild pain, headache or fever.    [provider]  ALPRAZolam Duanne Moron) 1 MG tablet Take 1 tablet (1 mg total) by mouth daily as needed for anxiety. 07/10/21   Caren Griffins, MD  aspirin EC 81 MG EC tablet Take 1 tablet (81 mg total) by mouth daily. Swallow whole. 05/04/21   [place] Eric J, DO  buPROPion (WELLBUTRIN SR) 200 MG 12 hr tablet Take 200 mg by mouth daily.     [provider]  busPIRone (BUSPAR) 5 MG tablet Take 5 mg by mouth 3 (three) times daily.    [provider]  carvedilol (COREG) 6.25 MG  tablet Take 1 tablet (6.25 mg total) by mouth 2 (two) times daily. 09/05/20   [place], Peter M, MD  docusate sodium (COLACE) 100 MG capsule Take 100 mg by mouth 2 (two) times daily.    [provider]  escitalopram (LEXAPRO) 20 MG tablet Take 20 mg by mouth daily.    [provider]  furosemide (LASIX) 20 MG tablet Take 20 mg daily if needed 07/17/21   [place], Peter M, MD  gabapentin (NEURONTIN) 300 MG capsule Take 300 mg by mouth at bedtime.    [provider]  levothyroxine (SYNTHROID) 100 MCG tablet Take 100 mcg by mouth daily.    [provider]  loperamide (IMODIUM) 2 MG capsule Take 2 mg by mouth 4 (four) times daily as needed for diarrhea or loose stools.    [provider]  losartan (COZAAR) 50 MG tablet Take 50 mg by mouth daily. 05/13/21   [provider]  ondansetron (ZOFRAN) 4 MG tablet Take 4 mg by mouth every 8 (eight) hours as needed for nausea/vomiting. 10/04/20   [provider]  pantoprazole (PROTONIX) 40 MG tablet Take 40 mg by mouth daily.    [provider]  polyethylene glycol (MIRALAX / GLYCOLAX) 17 g packet Take 17 g by mouth See admin instructions. 17g every other day as needed for constipation    [provider]  tamsulosin (FLOMAX) 0.4 MG  CAPS capsule Take 1 capsule (0.4 mg total) by mouth daily. 07/11/21   Caren Griffins, MD      Allergies    Vancomycin, Zosyn [piperacillin sod-tazobactam so], Hydrocodone, and Morphine and related    Review of Systems   Review of Systems  Constitutional:  Negative for fever.  Gastrointestinal:  Negative for abdominal pain, nausea and vomiting.  Genitourinary:  Positive for dysuria.  Musculoskeletal:  Negative for back pain.    Physical Exam Updated Vital Signs BP (!) 188/85 (BP Location: Right Arm)   Pulse 70   Temp 98.8 F (37.1 C) (Oral)   Resp 17   SpO2 95%  Physical Exam Vitals and nursing note reviewed.  Constitutional:      General: She  is not in acute distress.    Appearance: Normal appearance. She is well-developed.  HENT:     Head: Normocephalic and atraumatic.  Eyes:     Conjunctiva/sclera: Conjunctivae normal.  Cardiovascular:     Rate and Rhythm: Normal rate and regular rhythm.     Heart sounds: No murmur heard. Pulmonary:     Effort: Pulmonary effort is normal. No respiratory distress.     Breath sounds: Normal breath sounds.  Abdominal:     Palpations: Abdomen is soft.     Tenderness: There is no abdominal tenderness. There is no guarding or rebound.  Musculoskeletal:        General: No swelling.     Cervical back: Neck supple.  Skin:    General: Skin is warm and dry.     Capillary Refill: Capillary refill takes less than 2 seconds.  Neurological:     General: No focal deficit present.     Mental Status: She is alert.     ED Results / Procedures / Treatments   Labs (all labs ordered are listed, but only abnormal results are displayed) Labs Reviewed  URINALYSIS, ROUTINE W REFLEX MICROSCOPIC - Abnormal; Notable for the following components:      Result Value   APPearance CLOUDY (*)    Hgb urine dipstick LARGE (*)    Protein, ur >300 (*)    Leukocytes,Ua LARGE (*)    WBC, UA >50 (*)    Bacteria, UA RARE (*)    Non Squamous Epithelial 0-5 (*)    All other components within normal limits  BASIC METABOLIC PANEL - Abnormal; Notable for the following components:   CO2 21 (*)    Glucose, Bld 101 (*)    Creatinine, Ser 1.28 (*)    GFR, Estimated 40 (*)    All other components within normal limits  CBC WITH DIFFERENTIAL/PLATELET - Abnormal; Notable for the following components:   RBC 3.06 (*)    Hemoglobin 10.1 (*)    HCT 31.8 (*)    MCV 103.9 (*)    Eosinophils Absolute 0.6 (*)    All other components within normal limits  URINE CULTURE    EKG None  Radiology No results found.  Procedures Procedures    Medications Ordered in ED Medications  sodium chloride 0.9 % bolus 1,000 mL (0  mLs Intravenous Stopped 05/31/22 2155)  cefTRIAXone (ROCEPHIN) 1 g in sodium chloride 0.9 % 100 mL IVPB (0 g Intravenous Stopped 05/31/22 2155)    ED Course/ Medical Decision Making/ A&P                           Medical Decision Making Amount and/or Complexity of Data Reviewed Labs:  ordered.  Risk Prescription drug management.  This patient complains of 1 month of dysuria; this involves an extensive number of treatment Options and is a complaint that carries with it a high risk of complications and morbidity. The differential includes UTI, pyelonephritis, renal failure, metabolic derangement  I ordered, reviewed and interpreted labs, which included CBC with normal white count, hemoglobin low stable from priors, chemistries with low bicarb elevated creatinine reflecting some dehydration urinalysis with signs of infection greater than 50 whites rare bacteria sent for culture I ordered medication IV fluids IV antibiotic's and reviewed PMP when indicated. Additional history obtained from patient's daughter Previous records obtained and reviewed in epic no recent visits Cardiac monitoring reviewed, normal sinus rhythm Social determinants considered, no significant barriers Critical Interventions: None  After the interventions stated above, I reevaluated the patient and found patient very well-appearing.  Blood pressures are present here.  This will need to be followed with her PCP.  No indication of endorgan damage. Admission and further testing considered, no indication for admission or further testing at this time.  Will cover with antibiotics and close PCP follow-up.  Return instructions discussed with patient and daughter.          Final Clinical Impression(s) / ED Diagnoses Final diagnoses:  Urinary tract infection without hematuria, site unspecified    Rx / DC Orders ED Discharge Orders          Ordered    phenazopyridine (PYRIDIUM) 200 MG tablet  3 times daily,    Status:  Discontinued        05/31/22 2129    cephALEXin (KEFLEX) 500 MG capsule  2 times daily,   Status:  Discontinued        05/31/22 2129    cephALEXin (KEFLEX) 500 MG capsule  2 times daily        05/31/22 2157    phenazopyridine (PYRIDIUM) 200 MG tablet  3 times daily        05/31/22 2157              Hayden Rasmussen, MD 06/01/22 [phone]

## 2022-05-31 NOTE — ED Triage Notes (Addendum)
Patient reports UTI for over a month-lives [place]--has been on macrobid per daughter and medication list, ends June 1, 36mg BID x 10 days. Reports lower abdominal pressure and burning with urination. No fevers with the UTI. States she has been drinking water and coke. Denies nausea and vomiting.   Hx of right pelvis fracture, limited mobility-unable to have surgery. Uses wheelchair and ambulates with assistance.   Daughter at bedside.

## 2022-06-02 DIAGNOSIS — R110 Nausea: Secondary | ICD-10-CM | POA: Diagnosis not present

## 2022-06-02 DIAGNOSIS — N390 Urinary tract infection, site not specified: Secondary | ICD-10-CM | POA: Diagnosis not present

## 2022-06-03 DIAGNOSIS — R41841 Cognitive communication deficit: Secondary | ICD-10-CM | POA: Diagnosis not present

## 2022-06-03 DIAGNOSIS — M6281 Muscle weakness (generalized): Secondary | ICD-10-CM | POA: Diagnosis not present

## 2022-06-03 DIAGNOSIS — R2689 Other abnormalities of gait and mobility: Secondary | ICD-10-CM | POA: Diagnosis not present

## 2022-06-03 DIAGNOSIS — R488 Other symbolic dysfunctions: Secondary | ICD-10-CM | POA: Diagnosis not present

## 2022-06-03 LAB — URINE CULTURE: Culture: >=100000 — AB

## 2022-06-04 ENCOUNTER — Telehealth: Payer: Self-pay | Admitting: *Deleted

## 2022-06-04 DIAGNOSIS — R488 Other symbolic dysfunctions: Secondary | ICD-10-CM | POA: Diagnosis not present

## 2022-06-04 DIAGNOSIS — M6281 Muscle weakness (generalized): Secondary | ICD-10-CM | POA: Diagnosis not present

## 2022-06-04 DIAGNOSIS — R2689 Other abnormalities of gait and mobility: Secondary | ICD-10-CM | POA: Diagnosis not present

## 2022-06-04 DIAGNOSIS — R41841 Cognitive communication deficit: Secondary | ICD-10-CM | POA: Diagnosis not present

## 2022-06-04 NOTE — Telephone Encounter (Signed)
Post ED Visit - Positive Culture Follow-up: Unsuccessful Patient Follow-up  Culture assessed and recommendations reviewed by:  '[]'$  Elenor Quinones, Pharm.D. '[]'$  Heide Guile, Pharm.D., BCPS AQ-ID '[]'$  Parks Neptune, Pharm.D., BCPS '[]'$  Alycia Rossetti, Pharm.D., BCPS '[]'$  Struthers, Pharm.D., BCPS, AAHIVP '[]'$  Legrand Como, Pharm.D., BCPS, AAHIVP '[]'$  Wynell Balloon, PharmD '[]'$  Vincenza Hews, PharmD, BCPS  Positive urine culture  '[]'$  Patient discharged without antimicrobial prescription and treatment is now indicated '[x]'$  Organism is resistant to prescribed ED discharge antimicrobial '[]'$  Patient with positive blood cultures   Plan:  D/C Keflex, start Cipro 250 mg PO BID x 3 days, Domenic Moras, [facility]  Unable to contact patient after 3 attempts, letter will be sent to address on file  Ardeen Fillers 06/04/2022, 9:12 AM

## 2022-06-06 DIAGNOSIS — R488 Other symbolic dysfunctions: Secondary | ICD-10-CM | POA: Diagnosis not present

## 2022-06-06 DIAGNOSIS — R41841 Cognitive communication deficit: Secondary | ICD-10-CM | POA: Diagnosis not present

## 2022-06-06 DIAGNOSIS — R2689 Other abnormalities of gait and mobility: Secondary | ICD-10-CM | POA: Diagnosis not present

## 2022-06-06 DIAGNOSIS — M6281 Muscle weakness (generalized): Secondary | ICD-10-CM | POA: Diagnosis not present

## 2022-06-09 DIAGNOSIS — M6281 Muscle weakness (generalized): Secondary | ICD-10-CM | POA: Diagnosis not present

## 2022-06-09 DIAGNOSIS — R300 Dysuria: Secondary | ICD-10-CM | POA: Diagnosis not present

## 2022-06-09 DIAGNOSIS — R2689 Other abnormalities of gait and mobility: Secondary | ICD-10-CM | POA: Diagnosis not present

## 2022-06-09 DIAGNOSIS — R41841 Cognitive communication deficit: Secondary | ICD-10-CM | POA: Diagnosis not present

## 2022-06-09 DIAGNOSIS — R488 Other symbolic dysfunctions: Secondary | ICD-10-CM | POA: Diagnosis not present

## 2022-06-10 DIAGNOSIS — E038 Other specified hypothyroidism: Secondary | ICD-10-CM | POA: Diagnosis not present

## 2022-06-10 DIAGNOSIS — I110 Hypertensive heart disease with heart failure: Secondary | ICD-10-CM | POA: Diagnosis not present

## 2022-06-10 DIAGNOSIS — I10 Essential (primary) hypertension: Secondary | ICD-10-CM | POA: Diagnosis not present

## 2022-06-10 DIAGNOSIS — M6281 Muscle weakness (generalized): Secondary | ICD-10-CM | POA: Diagnosis not present

## 2022-06-10 DIAGNOSIS — N179 Acute kidney failure, unspecified: Secondary | ICD-10-CM | POA: Diagnosis not present

## 2022-06-10 DIAGNOSIS — R488 Other symbolic dysfunctions: Secondary | ICD-10-CM | POA: Diagnosis not present

## 2022-06-10 DIAGNOSIS — R41841 Cognitive communication deficit: Secondary | ICD-10-CM | POA: Diagnosis not present

## 2022-06-10 DIAGNOSIS — R2689 Other abnormalities of gait and mobility: Secondary | ICD-10-CM | POA: Diagnosis not present

## 2022-06-11 ENCOUNTER — Telehealth: Payer: Self-pay | Admitting: Emergency Medicine

## 2022-06-12 DIAGNOSIS — R110 Nausea: Secondary | ICD-10-CM | POA: Diagnosis not present

## 2022-06-12 DIAGNOSIS — N390 Urinary tract infection, site not specified: Secondary | ICD-10-CM | POA: Diagnosis not present

## 2022-06-13 DIAGNOSIS — R2689 Other abnormalities of gait and mobility: Secondary | ICD-10-CM | POA: Diagnosis not present

## 2022-06-13 DIAGNOSIS — R488 Other symbolic dysfunctions: Secondary | ICD-10-CM | POA: Diagnosis not present

## 2022-06-13 DIAGNOSIS — R41841 Cognitive communication deficit: Secondary | ICD-10-CM | POA: Diagnosis not present

## 2022-06-13 DIAGNOSIS — M6281 Muscle weakness (generalized): Secondary | ICD-10-CM | POA: Diagnosis not present

## 2022-06-16 DIAGNOSIS — R488 Other symbolic dysfunctions: Secondary | ICD-10-CM | POA: Diagnosis not present

## 2022-06-16 DIAGNOSIS — R4182 Altered mental status, unspecified: Secondary | ICD-10-CM | POA: Diagnosis not present

## 2022-06-16 DIAGNOSIS — N390 Urinary tract infection, site not specified: Secondary | ICD-10-CM | POA: Diagnosis not present

## 2022-06-16 DIAGNOSIS — R2689 Other abnormalities of gait and mobility: Secondary | ICD-10-CM | POA: Diagnosis not present

## 2022-06-16 DIAGNOSIS — R41841 Cognitive communication deficit: Secondary | ICD-10-CM | POA: Diagnosis not present

## 2022-06-16 DIAGNOSIS — M6281 Muscle weakness (generalized): Secondary | ICD-10-CM | POA: Diagnosis not present

## 2022-06-17 DIAGNOSIS — R2689 Other abnormalities of gait and mobility: Secondary | ICD-10-CM | POA: Diagnosis not present

## 2022-06-17 DIAGNOSIS — R488 Other symbolic dysfunctions: Secondary | ICD-10-CM | POA: Diagnosis not present

## 2022-06-17 DIAGNOSIS — M6281 Muscle weakness (generalized): Secondary | ICD-10-CM | POA: Diagnosis not present

## 2022-06-17 DIAGNOSIS — R41841 Cognitive communication deficit: Secondary | ICD-10-CM | POA: Diagnosis not present

## 2022-06-18 DIAGNOSIS — R2689 Other abnormalities of gait and mobility: Secondary | ICD-10-CM | POA: Diagnosis not present

## 2022-06-18 DIAGNOSIS — R488 Other symbolic dysfunctions: Secondary | ICD-10-CM | POA: Diagnosis not present

## 2022-06-18 DIAGNOSIS — R41841 Cognitive communication deficit: Secondary | ICD-10-CM | POA: Diagnosis not present

## 2022-06-18 DIAGNOSIS — M6281 Muscle weakness (generalized): Secondary | ICD-10-CM | POA: Diagnosis not present

## 2022-06-19 DIAGNOSIS — R41841 Cognitive communication deficit: Secondary | ICD-10-CM | POA: Diagnosis not present

## 2022-06-19 DIAGNOSIS — R488 Other symbolic dysfunctions: Secondary | ICD-10-CM | POA: Diagnosis not present

## 2022-06-19 DIAGNOSIS — M6281 Muscle weakness (generalized): Secondary | ICD-10-CM | POA: Diagnosis not present

## 2022-06-19 DIAGNOSIS — R2689 Other abnormalities of gait and mobility: Secondary | ICD-10-CM | POA: Diagnosis not present

## 2022-06-23 DIAGNOSIS — M6281 Muscle weakness (generalized): Secondary | ICD-10-CM | POA: Diagnosis not present

## 2022-06-23 DIAGNOSIS — R488 Other symbolic dysfunctions: Secondary | ICD-10-CM | POA: Diagnosis not present

## 2022-06-23 DIAGNOSIS — R41841 Cognitive communication deficit: Secondary | ICD-10-CM | POA: Diagnosis not present

## 2022-06-23 DIAGNOSIS — R2689 Other abnormalities of gait and mobility: Secondary | ICD-10-CM | POA: Diagnosis not present

## 2022-06-25 DIAGNOSIS — R41841 Cognitive communication deficit: Secondary | ICD-10-CM | POA: Diagnosis not present

## 2022-06-25 DIAGNOSIS — R488 Other symbolic dysfunctions: Secondary | ICD-10-CM | POA: Diagnosis not present

## 2022-06-25 DIAGNOSIS — M6281 Muscle weakness (generalized): Secondary | ICD-10-CM | POA: Diagnosis not present

## 2022-06-25 DIAGNOSIS — R2689 Other abnormalities of gait and mobility: Secondary | ICD-10-CM | POA: Diagnosis not present

## 2022-06-26 DIAGNOSIS — R197 Diarrhea, unspecified: Secondary | ICD-10-CM | POA: Diagnosis not present

## 2022-06-26 DIAGNOSIS — I10 Essential (primary) hypertension: Secondary | ICD-10-CM | POA: Diagnosis not present

## 2022-06-26 DIAGNOSIS — G301 Alzheimer's disease with late onset: Secondary | ICD-10-CM | POA: Diagnosis not present

## 2022-06-26 DIAGNOSIS — M6281 Muscle weakness (generalized): Secondary | ICD-10-CM | POA: Diagnosis not present

## 2022-06-26 DIAGNOSIS — R41841 Cognitive communication deficit: Secondary | ICD-10-CM | POA: Diagnosis not present

## 2022-06-26 DIAGNOSIS — F331 Major depressive disorder, recurrent, moderate: Secondary | ICD-10-CM | POA: Diagnosis not present

## 2022-06-26 DIAGNOSIS — R2689 Other abnormalities of gait and mobility: Secondary | ICD-10-CM | POA: Diagnosis not present

## 2022-06-26 DIAGNOSIS — F02818 Dementia in other diseases classified elsewhere, unspecified severity, with other behavioral disturbance: Secondary | ICD-10-CM | POA: Diagnosis not present

## 2022-06-26 DIAGNOSIS — R488 Other symbolic dysfunctions: Secondary | ICD-10-CM | POA: Diagnosis not present

## 2022-06-26 DIAGNOSIS — E038 Other specified hypothyroidism: Secondary | ICD-10-CM | POA: Diagnosis not present

## 2022-06-27 DIAGNOSIS — R2689 Other abnormalities of gait and mobility: Secondary | ICD-10-CM | POA: Diagnosis not present

## 2022-06-27 DIAGNOSIS — R41841 Cognitive communication deficit: Secondary | ICD-10-CM | POA: Diagnosis not present

## 2022-06-27 DIAGNOSIS — M6281 Muscle weakness (generalized): Secondary | ICD-10-CM | POA: Diagnosis not present

## 2022-06-27 DIAGNOSIS — R488 Other symbolic dysfunctions: Secondary | ICD-10-CM | POA: Diagnosis not present

## 2022-06-30 DIAGNOSIS — R2689 Other abnormalities of gait and mobility: Secondary | ICD-10-CM | POA: Diagnosis not present

## 2022-06-30 DIAGNOSIS — M6281 Muscle weakness (generalized): Secondary | ICD-10-CM | POA: Diagnosis not present

## 2022-06-30 DIAGNOSIS — D509 Iron deficiency anemia, unspecified: Secondary | ICD-10-CM | POA: Diagnosis not present

## 2022-06-30 DIAGNOSIS — I10 Essential (primary) hypertension: Secondary | ICD-10-CM | POA: Diagnosis not present

## 2022-06-30 DIAGNOSIS — R41841 Cognitive communication deficit: Secondary | ICD-10-CM | POA: Diagnosis not present

## 2022-06-30 DIAGNOSIS — R488 Other symbolic dysfunctions: Secondary | ICD-10-CM | POA: Diagnosis not present

## 2022-06-30 DIAGNOSIS — Z87440 Personal history of urinary (tract) infections: Secondary | ICD-10-CM | POA: Diagnosis not present

## 2022-06-30 DIAGNOSIS — E039 Hypothyroidism, unspecified: Secondary | ICD-10-CM | POA: Diagnosis not present

## 2022-06-30 DIAGNOSIS — R233 Spontaneous ecchymoses: Secondary | ICD-10-CM | POA: Diagnosis not present

## 2022-07-01 DIAGNOSIS — R488 Other symbolic dysfunctions: Secondary | ICD-10-CM | POA: Diagnosis not present

## 2022-07-01 DIAGNOSIS — R41841 Cognitive communication deficit: Secondary | ICD-10-CM | POA: Diagnosis not present

## 2022-07-01 DIAGNOSIS — M6281 Muscle weakness (generalized): Secondary | ICD-10-CM | POA: Diagnosis not present

## 2022-07-01 DIAGNOSIS — R2689 Other abnormalities of gait and mobility: Secondary | ICD-10-CM | POA: Diagnosis not present

## 2022-07-02 DIAGNOSIS — R488 Other symbolic dysfunctions: Secondary | ICD-10-CM | POA: Diagnosis not present

## 2022-07-02 DIAGNOSIS — M6281 Muscle weakness (generalized): Secondary | ICD-10-CM | POA: Diagnosis not present

## 2022-07-02 DIAGNOSIS — R41841 Cognitive communication deficit: Secondary | ICD-10-CM | POA: Diagnosis not present

## 2022-07-02 DIAGNOSIS — R2689 Other abnormalities of gait and mobility: Secondary | ICD-10-CM | POA: Diagnosis not present

## 2022-07-03 DIAGNOSIS — R2689 Other abnormalities of gait and mobility: Secondary | ICD-10-CM | POA: Diagnosis not present

## 2022-07-03 DIAGNOSIS — R488 Other symbolic dysfunctions: Secondary | ICD-10-CM | POA: Diagnosis not present

## 2022-07-03 DIAGNOSIS — M6281 Muscle weakness (generalized): Secondary | ICD-10-CM | POA: Diagnosis not present

## 2022-07-03 DIAGNOSIS — R41841 Cognitive communication deficit: Secondary | ICD-10-CM | POA: Diagnosis not present

## 2022-07-07 DIAGNOSIS — M6281 Muscle weakness (generalized): Secondary | ICD-10-CM | POA: Diagnosis not present

## 2022-07-07 DIAGNOSIS — R41841 Cognitive communication deficit: Secondary | ICD-10-CM | POA: Diagnosis not present

## 2022-07-07 DIAGNOSIS — R2689 Other abnormalities of gait and mobility: Secondary | ICD-10-CM | POA: Diagnosis not present

## 2022-07-07 DIAGNOSIS — R488 Other symbolic dysfunctions: Secondary | ICD-10-CM | POA: Diagnosis not present

## 2022-07-08 DIAGNOSIS — R2689 Other abnormalities of gait and mobility: Secondary | ICD-10-CM | POA: Diagnosis not present

## 2022-07-08 DIAGNOSIS — R41841 Cognitive communication deficit: Secondary | ICD-10-CM | POA: Diagnosis not present

## 2022-07-08 DIAGNOSIS — R488 Other symbolic dysfunctions: Secondary | ICD-10-CM | POA: Diagnosis not present

## 2022-07-08 DIAGNOSIS — M6281 Muscle weakness (generalized): Secondary | ICD-10-CM | POA: Diagnosis not present

## 2022-07-09 DIAGNOSIS — M6281 Muscle weakness (generalized): Secondary | ICD-10-CM | POA: Diagnosis not present

## 2022-07-09 DIAGNOSIS — R41841 Cognitive communication deficit: Secondary | ICD-10-CM | POA: Diagnosis not present

## 2022-07-09 DIAGNOSIS — R488 Other symbolic dysfunctions: Secondary | ICD-10-CM | POA: Diagnosis not present

## 2022-07-09 DIAGNOSIS — R2689 Other abnormalities of gait and mobility: Secondary | ICD-10-CM | POA: Diagnosis not present

## 2022-07-10 DIAGNOSIS — M6281 Muscle weakness (generalized): Secondary | ICD-10-CM | POA: Diagnosis not present

## 2022-07-10 DIAGNOSIS — R41841 Cognitive communication deficit: Secondary | ICD-10-CM | POA: Diagnosis not present

## 2022-07-10 DIAGNOSIS — R488 Other symbolic dysfunctions: Secondary | ICD-10-CM | POA: Diagnosis not present

## 2022-07-10 DIAGNOSIS — R2689 Other abnormalities of gait and mobility: Secondary | ICD-10-CM | POA: Diagnosis not present

## 2022-07-11 DIAGNOSIS — R2689 Other abnormalities of gait and mobility: Secondary | ICD-10-CM | POA: Diagnosis not present

## 2022-07-11 DIAGNOSIS — R41841 Cognitive communication deficit: Secondary | ICD-10-CM | POA: Diagnosis not present

## 2022-07-11 DIAGNOSIS — M6281 Muscle weakness (generalized): Secondary | ICD-10-CM | POA: Diagnosis not present

## 2022-07-11 DIAGNOSIS — R488 Other symbolic dysfunctions: Secondary | ICD-10-CM | POA: Diagnosis not present

## 2022-07-14 DIAGNOSIS — Z87440 Personal history of urinary (tract) infections: Secondary | ICD-10-CM | POA: Diagnosis not present

## 2022-07-14 DIAGNOSIS — R2689 Other abnormalities of gait and mobility: Secondary | ICD-10-CM | POA: Diagnosis not present

## 2022-07-14 DIAGNOSIS — M6281 Muscle weakness (generalized): Secondary | ICD-10-CM | POA: Diagnosis not present

## 2022-07-14 DIAGNOSIS — R488 Other symbolic dysfunctions: Secondary | ICD-10-CM | POA: Diagnosis not present

## 2022-07-14 DIAGNOSIS — R41841 Cognitive communication deficit: Secondary | ICD-10-CM | POA: Diagnosis not present

## 2022-07-14 DIAGNOSIS — B370 Candidal stomatitis: Secondary | ICD-10-CM | POA: Diagnosis not present

## 2022-07-16 DIAGNOSIS — R41841 Cognitive communication deficit: Secondary | ICD-10-CM | POA: Diagnosis not present

## 2022-07-16 DIAGNOSIS — R488 Other symbolic dysfunctions: Secondary | ICD-10-CM | POA: Diagnosis not present

## 2022-07-16 DIAGNOSIS — M6281 Muscle weakness (generalized): Secondary | ICD-10-CM | POA: Diagnosis not present

## 2022-07-16 DIAGNOSIS — R2689 Other abnormalities of gait and mobility: Secondary | ICD-10-CM | POA: Diagnosis not present

## 2022-07-18 DIAGNOSIS — R2689 Other abnormalities of gait and mobility: Secondary | ICD-10-CM | POA: Diagnosis not present

## 2022-07-18 DIAGNOSIS — M6281 Muscle weakness (generalized): Secondary | ICD-10-CM | POA: Diagnosis not present

## 2022-07-18 DIAGNOSIS — R488 Other symbolic dysfunctions: Secondary | ICD-10-CM | POA: Diagnosis not present

## 2022-07-18 DIAGNOSIS — R41841 Cognitive communication deficit: Secondary | ICD-10-CM | POA: Diagnosis not present

## 2022-07-22 DIAGNOSIS — R488 Other symbolic dysfunctions: Secondary | ICD-10-CM | POA: Diagnosis not present

## 2022-07-22 DIAGNOSIS — M6281 Muscle weakness (generalized): Secondary | ICD-10-CM | POA: Diagnosis not present

## 2022-07-22 DIAGNOSIS — R2689 Other abnormalities of gait and mobility: Secondary | ICD-10-CM | POA: Diagnosis not present

## 2022-07-22 DIAGNOSIS — R41841 Cognitive communication deficit: Secondary | ICD-10-CM | POA: Diagnosis not present

## 2022-07-23 DIAGNOSIS — R41841 Cognitive communication deficit: Secondary | ICD-10-CM | POA: Diagnosis not present

## 2022-07-23 DIAGNOSIS — M6281 Muscle weakness (generalized): Secondary | ICD-10-CM | POA: Diagnosis not present

## 2022-07-23 DIAGNOSIS — R488 Other symbolic dysfunctions: Secondary | ICD-10-CM | POA: Diagnosis not present

## 2022-07-23 DIAGNOSIS — R2689 Other abnormalities of gait and mobility: Secondary | ICD-10-CM | POA: Diagnosis not present

## 2022-07-25 DIAGNOSIS — M6281 Muscle weakness (generalized): Secondary | ICD-10-CM | POA: Diagnosis not present

## 2022-07-25 DIAGNOSIS — R2689 Other abnormalities of gait and mobility: Secondary | ICD-10-CM | POA: Diagnosis not present

## 2022-07-25 DIAGNOSIS — R41841 Cognitive communication deficit: Secondary | ICD-10-CM | POA: Diagnosis not present

## 2022-07-25 DIAGNOSIS — R488 Other symbolic dysfunctions: Secondary | ICD-10-CM | POA: Diagnosis not present

## 2022-07-26 DIAGNOSIS — R2689 Other abnormalities of gait and mobility: Secondary | ICD-10-CM | POA: Diagnosis not present

## 2022-07-26 DIAGNOSIS — M6281 Muscle weakness (generalized): Secondary | ICD-10-CM | POA: Diagnosis not present

## 2022-07-26 DIAGNOSIS — R488 Other symbolic dysfunctions: Secondary | ICD-10-CM | POA: Diagnosis not present

## 2022-07-26 DIAGNOSIS — R41841 Cognitive communication deficit: Secondary | ICD-10-CM | POA: Diagnosis not present

## 2022-07-28 DIAGNOSIS — M6281 Muscle weakness (generalized): Secondary | ICD-10-CM | POA: Diagnosis not present

## 2022-07-28 DIAGNOSIS — R2689 Other abnormalities of gait and mobility: Secondary | ICD-10-CM | POA: Diagnosis not present

## 2022-07-28 DIAGNOSIS — R488 Other symbolic dysfunctions: Secondary | ICD-10-CM | POA: Diagnosis not present

## 2022-07-28 DIAGNOSIS — R41841 Cognitive communication deficit: Secondary | ICD-10-CM | POA: Diagnosis not present

## 2022-07-29 DIAGNOSIS — R488 Other symbolic dysfunctions: Secondary | ICD-10-CM | POA: Diagnosis not present

## 2022-07-29 DIAGNOSIS — R41841 Cognitive communication deficit: Secondary | ICD-10-CM | POA: Diagnosis not present

## 2022-07-29 DIAGNOSIS — M6281 Muscle weakness (generalized): Secondary | ICD-10-CM | POA: Diagnosis not present

## 2022-07-29 DIAGNOSIS — R2689 Other abnormalities of gait and mobility: Secondary | ICD-10-CM | POA: Diagnosis not present

## 2022-07-30 DIAGNOSIS — R2689 Other abnormalities of gait and mobility: Secondary | ICD-10-CM | POA: Diagnosis not present

## 2022-07-30 DIAGNOSIS — R488 Other symbolic dysfunctions: Secondary | ICD-10-CM | POA: Diagnosis not present

## 2022-07-30 DIAGNOSIS — R41841 Cognitive communication deficit: Secondary | ICD-10-CM | POA: Diagnosis not present

## 2022-07-30 DIAGNOSIS — M6281 Muscle weakness (generalized): Secondary | ICD-10-CM | POA: Diagnosis not present

## 2022-08-01 DIAGNOSIS — R488 Other symbolic dysfunctions: Secondary | ICD-10-CM | POA: Diagnosis not present

## 2022-08-01 DIAGNOSIS — R2689 Other abnormalities of gait and mobility: Secondary | ICD-10-CM | POA: Diagnosis not present

## 2022-08-01 DIAGNOSIS — M6281 Muscle weakness (generalized): Secondary | ICD-10-CM | POA: Diagnosis not present

## 2022-08-01 DIAGNOSIS — R41841 Cognitive communication deficit: Secondary | ICD-10-CM | POA: Diagnosis not present

## 2022-08-04 ENCOUNTER — Emergency Department (HOSPITAL_BASED_OUTPATIENT_CLINIC_OR_DEPARTMENT_OTHER)
Admission: EM | Admit: 2022-08-04 | Discharge: 2022-08-04 | Disposition: A | Discharge status: Home / Self Care | Payer: Medicare Other | Attending: Emergency Medicine | Admitting: Emergency Medicine

## 2022-08-04 ENCOUNTER — Encounter (HOSPITAL_BASED_OUTPATIENT_CLINIC_OR_DEPARTMENT_OTHER): Payer: Self-pay

## 2022-08-04 ENCOUNTER — Other Ambulatory Visit: Payer: Self-pay

## 2022-08-04 DIAGNOSIS — R2689 Other abnormalities of gait and mobility: Secondary | ICD-10-CM | POA: Diagnosis not present

## 2022-08-04 DIAGNOSIS — M6281 Muscle weakness (generalized): Secondary | ICD-10-CM | POA: Diagnosis not present

## 2022-08-04 DIAGNOSIS — R41841 Cognitive communication deficit: Secondary | ICD-10-CM | POA: Diagnosis not present

## 2022-08-04 DIAGNOSIS — I10 Essential (primary) hypertension: Secondary | ICD-10-CM | POA: Insufficient documentation

## 2022-08-04 DIAGNOSIS — Z79899 Other long term (current) drug therapy: Secondary | ICD-10-CM | POA: Insufficient documentation

## 2022-08-04 DIAGNOSIS — R488 Other symbolic dysfunctions: Secondary | ICD-10-CM | POA: Diagnosis not present

## 2022-08-04 DIAGNOSIS — Z7982 Long term (current) use of aspirin: Secondary | ICD-10-CM | POA: Diagnosis not present

## 2022-08-04 LAB — CBC WITH DIFFERENTIAL/PLATELET
Abs Immature Granulocytes: 0.01 10*3/uL (ref 0.00–0.07)
Basophils Absolute: 0.1 10*3/uL (ref 0.0–0.1)
Basophils Relative: 1 %
Eosinophils Absolute: 0.5 10*3/uL (ref 0.0–0.5)
Eosinophils Relative: 7 %
HCT: 36.9 % (ref 36.0–46.0)
Hemoglobin: 12.3 g/dL (ref 12.0–15.0)
Immature Granulocytes: 0 %
Lymphocytes Relative: 39 %
Lymphs Abs: 2.7 10*3/uL (ref 0.7–4.0)
MCH: 32.9 pg (ref 26.0–34.0)
MCHC: 33.3 g/dL (ref 30.0–36.0)
MCV: 98.7 fL (ref 80.0–100.0)
Monocytes Absolute: 0.6 10*3/uL (ref 0.1–1.0)
Monocytes Relative: 8 %
Neutro Abs: 3.2 10*3/uL (ref 1.7–7.7)
Neutrophils Relative %: 45 %
Platelets: 237 10*3/uL (ref 150–400)
RBC: 3.74 MIL/uL — ABNORMAL LOW (ref 3.87–5.11)
RDW: 11.9 % (ref 11.5–15.5)
WBC: 7 10*3/uL (ref 4.0–10.5)
nRBC: 0 % (ref 0.0–0.2)

## 2022-08-04 LAB — COMPREHENSIVE METABOLIC PANEL
ALT: 9 U/L (ref 0–44)
AST: 20 U/L (ref 15–41)
Albumin: 4.1 g/dL (ref 3.5–5.0)
Alkaline Phosphatase: 52 U/L (ref 38–126)
Anion gap: 11 (ref 5–15)
BUN: 17 mg/dL (ref 8–23)
CO2: 21 mmol/L — ABNORMAL LOW (ref 22–32)
Calcium: 9.2 mg/dL (ref 8.9–10.3)
Chloride: 103 mmol/L (ref 98–111)
Creatinine, Ser: 1.25 mg/dL — ABNORMAL HIGH (ref 0.44–1.00)
GFR, Estimated: 41 mL/min — ABNORMAL LOW (ref >60)
Glucose, Bld: 94 mg/dL (ref 70–99)
Potassium: 4 mmol/L (ref 3.5–5.1)
Sodium: 135 mmol/L (ref 135–145)
Total Bilirubin: 0.3 mg/dL (ref 0.3–1.2)
Total Protein: 7.5 g/dL (ref 6.5–8.1)

## 2022-08-04 LAB — TROPONIN I (HIGH SENSITIVITY): Troponin I (High Sensitivity): 12 ng/L (ref <18)
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
## 2022-08-04 NOTE — ED Notes (Signed)
Per family, pt now sees Alver Fisher as primary MD at [facility] assisted living [phone]

## 2022-08-04 NOTE — Discharge Instructions (Addendum)
Please return for headache confusion one-sided numbness or weakness difficulty speech or swallowing chest pain or difficulty breathing.

## 2022-08-04 NOTE — ED Triage Notes (Signed)
Pt from assisited living  Sent here for high bp not responding to additional BP meds Was given Coreg 6.'25mg'$  & Losartan '50mg'$  PO  '@1540'$ .  1 hr later pressure 195/91

## 2022-08-05 DIAGNOSIS — M6281 Muscle weakness (generalized): Secondary | ICD-10-CM | POA: Diagnosis not present

## 2022-08-05 DIAGNOSIS — R2689 Other abnormalities of gait and mobility: Secondary | ICD-10-CM | POA: Diagnosis not present

## 2022-08-05 DIAGNOSIS — R488 Other symbolic dysfunctions: Secondary | ICD-10-CM | POA: Diagnosis not present

## 2022-08-05 DIAGNOSIS — R41841 Cognitive communication deficit: Secondary | ICD-10-CM | POA: Diagnosis not present

## 2022-08-06 DIAGNOSIS — M6281 Muscle weakness (generalized): Secondary | ICD-10-CM | POA: Diagnosis not present

## 2022-08-06 DIAGNOSIS — R2689 Other abnormalities of gait and mobility: Secondary | ICD-10-CM | POA: Diagnosis not present

## 2022-08-06 DIAGNOSIS — R41841 Cognitive communication deficit: Secondary | ICD-10-CM | POA: Diagnosis not present

## 2022-08-06 DIAGNOSIS — R488 Other symbolic dysfunctions: Secondary | ICD-10-CM | POA: Diagnosis not present

## 2022-08-07 DIAGNOSIS — I5042 Chronic combined systolic (congestive) and diastolic (congestive) heart failure: Secondary | ICD-10-CM | POA: Diagnosis not present

## 2022-08-07 DIAGNOSIS — R41841 Cognitive communication deficit: Secondary | ICD-10-CM | POA: Diagnosis not present

## 2022-08-07 DIAGNOSIS — R2689 Other abnormalities of gait and mobility: Secondary | ICD-10-CM | POA: Diagnosis not present

## 2022-08-07 DIAGNOSIS — M6281 Muscle weakness (generalized): Secondary | ICD-10-CM | POA: Diagnosis not present

## 2022-08-07 DIAGNOSIS — I10 Essential (primary) hypertension: Secondary | ICD-10-CM | POA: Diagnosis not present

## 2022-08-07 DIAGNOSIS — R488 Other symbolic dysfunctions: Secondary | ICD-10-CM | POA: Diagnosis not present

## 2022-08-07 DIAGNOSIS — E039 Hypothyroidism, unspecified: Secondary | ICD-10-CM | POA: Diagnosis not present

## 2022-08-08 DIAGNOSIS — R41841 Cognitive communication deficit: Secondary | ICD-10-CM | POA: Diagnosis not present

## 2022-08-08 DIAGNOSIS — M6281 Muscle weakness (generalized): Secondary | ICD-10-CM | POA: Diagnosis not present

## 2022-08-08 DIAGNOSIS — R488 Other symbolic dysfunctions: Secondary | ICD-10-CM | POA: Diagnosis not present

## 2022-08-08 DIAGNOSIS — R2689 Other abnormalities of gait and mobility: Secondary | ICD-10-CM | POA: Diagnosis not present

## 2022-08-12 DIAGNOSIS — R2689 Other abnormalities of gait and mobility: Secondary | ICD-10-CM | POA: Diagnosis not present

## 2022-08-12 DIAGNOSIS — R488 Other symbolic dysfunctions: Secondary | ICD-10-CM | POA: Diagnosis not present

## 2022-08-12 DIAGNOSIS — R41841 Cognitive communication deficit: Secondary | ICD-10-CM | POA: Diagnosis not present

## 2022-08-12 DIAGNOSIS — M6281 Muscle weakness (generalized): Secondary | ICD-10-CM | POA: Diagnosis not present

## 2022-08-13 DIAGNOSIS — R41841 Cognitive communication deficit: Secondary | ICD-10-CM | POA: Diagnosis not present

## 2022-08-13 DIAGNOSIS — R2689 Other abnormalities of gait and mobility: Secondary | ICD-10-CM | POA: Diagnosis not present

## 2022-08-13 DIAGNOSIS — R488 Other symbolic dysfunctions: Secondary | ICD-10-CM | POA: Diagnosis not present

## 2022-08-13 DIAGNOSIS — M6281 Muscle weakness (generalized): Secondary | ICD-10-CM | POA: Diagnosis not present

## 2022-08-14 DIAGNOSIS — R41841 Cognitive communication deficit: Secondary | ICD-10-CM | POA: Diagnosis not present

## 2022-08-14 DIAGNOSIS — R488 Other symbolic dysfunctions: Secondary | ICD-10-CM | POA: Diagnosis not present

## 2022-08-14 DIAGNOSIS — R2689 Other abnormalities of gait and mobility: Secondary | ICD-10-CM | POA: Diagnosis not present

## 2022-08-14 DIAGNOSIS — M6281 Muscle weakness (generalized): Secondary | ICD-10-CM | POA: Diagnosis not present

## 2022-08-15 DIAGNOSIS — R2689 Other abnormalities of gait and mobility: Secondary | ICD-10-CM | POA: Diagnosis not present

## 2022-08-15 DIAGNOSIS — R488 Other symbolic dysfunctions: Secondary | ICD-10-CM | POA: Diagnosis not present

## 2022-08-15 DIAGNOSIS — R41841 Cognitive communication deficit: Secondary | ICD-10-CM | POA: Diagnosis not present

## 2022-08-15 DIAGNOSIS — M6281 Muscle weakness (generalized): Secondary | ICD-10-CM | POA: Diagnosis not present

## 2022-08-18 DIAGNOSIS — R41841 Cognitive communication deficit: Secondary | ICD-10-CM | POA: Diagnosis not present

## 2022-08-18 DIAGNOSIS — R2689 Other abnormalities of gait and mobility: Secondary | ICD-10-CM | POA: Diagnosis not present

## 2022-08-18 DIAGNOSIS — R488 Other symbolic dysfunctions: Secondary | ICD-10-CM | POA: Diagnosis not present

## 2022-08-18 DIAGNOSIS — M6281 Muscle weakness (generalized): Secondary | ICD-10-CM | POA: Diagnosis not present

## 2022-08-19 DIAGNOSIS — R2689 Other abnormalities of gait and mobility: Secondary | ICD-10-CM | POA: Diagnosis not present

## 2022-08-19 DIAGNOSIS — R41841 Cognitive communication deficit: Secondary | ICD-10-CM | POA: Diagnosis not present

## 2022-08-19 DIAGNOSIS — M6281 Muscle weakness (generalized): Secondary | ICD-10-CM | POA: Diagnosis not present

## 2022-08-19 DIAGNOSIS — R488 Other symbolic dysfunctions: Secondary | ICD-10-CM | POA: Diagnosis not present

## 2022-08-20 DIAGNOSIS — R488 Other symbolic dysfunctions: Secondary | ICD-10-CM | POA: Diagnosis not present

## 2022-08-20 DIAGNOSIS — R2689 Other abnormalities of gait and mobility: Secondary | ICD-10-CM | POA: Diagnosis not present

## 2022-08-20 DIAGNOSIS — R41841 Cognitive communication deficit: Secondary | ICD-10-CM | POA: Diagnosis not present

## 2022-08-20 DIAGNOSIS — M6281 Muscle weakness (generalized): Secondary | ICD-10-CM | POA: Diagnosis not present

## 2022-08-21 DIAGNOSIS — R488 Other symbolic dysfunctions: Secondary | ICD-10-CM | POA: Diagnosis not present

## 2022-08-21 DIAGNOSIS — M6281 Muscle weakness (generalized): Secondary | ICD-10-CM | POA: Diagnosis not present

## 2022-08-21 DIAGNOSIS — R233 Spontaneous ecchymoses: Secondary | ICD-10-CM | POA: Diagnosis not present

## 2022-08-21 DIAGNOSIS — R296 Repeated falls: Secondary | ICD-10-CM | POA: Diagnosis not present

## 2022-08-21 DIAGNOSIS — R2689 Other abnormalities of gait and mobility: Secondary | ICD-10-CM | POA: Diagnosis not present

## 2022-08-21 DIAGNOSIS — R41841 Cognitive communication deficit: Secondary | ICD-10-CM | POA: Diagnosis not present

## 2022-08-25 DIAGNOSIS — M6281 Muscle weakness (generalized): Secondary | ICD-10-CM | POA: Diagnosis not present

## 2022-08-25 DIAGNOSIS — R41841 Cognitive communication deficit: Secondary | ICD-10-CM | POA: Diagnosis not present

## 2022-08-25 DIAGNOSIS — R2689 Other abnormalities of gait and mobility: Secondary | ICD-10-CM | POA: Diagnosis not present

## 2022-08-25 DIAGNOSIS — R488 Other symbolic dysfunctions: Secondary | ICD-10-CM | POA: Diagnosis not present

## 2022-08-26 DIAGNOSIS — R41841 Cognitive communication deficit: Secondary | ICD-10-CM | POA: Diagnosis not present

## 2022-08-26 DIAGNOSIS — R488 Other symbolic dysfunctions: Secondary | ICD-10-CM | POA: Diagnosis not present

## 2022-08-26 DIAGNOSIS — M6281 Muscle weakness (generalized): Secondary | ICD-10-CM | POA: Diagnosis not present

## 2022-08-26 DIAGNOSIS — R2689 Other abnormalities of gait and mobility: Secondary | ICD-10-CM | POA: Diagnosis not present

## 2022-08-27 DIAGNOSIS — M6281 Muscle weakness (generalized): Secondary | ICD-10-CM | POA: Diagnosis not present

## 2022-08-27 DIAGNOSIS — R41841 Cognitive communication deficit: Secondary | ICD-10-CM | POA: Diagnosis not present

## 2022-08-27 DIAGNOSIS — R2689 Other abnormalities of gait and mobility: Secondary | ICD-10-CM | POA: Diagnosis not present

## 2022-08-27 DIAGNOSIS — R488 Other symbolic dysfunctions: Secondary | ICD-10-CM | POA: Diagnosis not present

## 2022-08-28 DIAGNOSIS — M6281 Muscle weakness (generalized): Secondary | ICD-10-CM | POA: Diagnosis not present

## 2022-08-28 DIAGNOSIS — R41841 Cognitive communication deficit: Secondary | ICD-10-CM | POA: Diagnosis not present

## 2022-08-28 DIAGNOSIS — R2689 Other abnormalities of gait and mobility: Secondary | ICD-10-CM | POA: Diagnosis not present

## 2022-08-28 DIAGNOSIS — R488 Other symbolic dysfunctions: Secondary | ICD-10-CM | POA: Diagnosis not present

## 2022-08-29 DIAGNOSIS — R2689 Other abnormalities of gait and mobility: Secondary | ICD-10-CM | POA: Diagnosis not present

## 2022-08-29 DIAGNOSIS — R41841 Cognitive communication deficit: Secondary | ICD-10-CM | POA: Diagnosis not present

## 2022-08-29 DIAGNOSIS — M6281 Muscle weakness (generalized): Secondary | ICD-10-CM | POA: Diagnosis not present

## 2022-08-29 DIAGNOSIS — R488 Other symbolic dysfunctions: Secondary | ICD-10-CM | POA: Diagnosis not present

## 2022-08-30 DIAGNOSIS — R488 Other symbolic dysfunctions: Secondary | ICD-10-CM | POA: Diagnosis not present

## 2022-08-30 DIAGNOSIS — R41841 Cognitive communication deficit: Secondary | ICD-10-CM | POA: Diagnosis not present

## 2022-08-30 DIAGNOSIS — R2689 Other abnormalities of gait and mobility: Secondary | ICD-10-CM | POA: Diagnosis not present

## 2022-08-30 DIAGNOSIS — M6281 Muscle weakness (generalized): Secondary | ICD-10-CM | POA: Diagnosis not present

## 2022-09-01 DIAGNOSIS — M6281 Muscle weakness (generalized): Secondary | ICD-10-CM | POA: Diagnosis not present

## 2022-09-01 DIAGNOSIS — R2689 Other abnormalities of gait and mobility: Secondary | ICD-10-CM | POA: Diagnosis not present

## 2022-09-01 DIAGNOSIS — R488 Other symbolic dysfunctions: Secondary | ICD-10-CM | POA: Diagnosis not present

## 2022-09-01 DIAGNOSIS — R41841 Cognitive communication deficit: Secondary | ICD-10-CM | POA: Diagnosis not present

## 2022-09-02 DIAGNOSIS — R41841 Cognitive communication deficit: Secondary | ICD-10-CM | POA: Diagnosis not present

## 2022-09-02 DIAGNOSIS — R488 Other symbolic dysfunctions: Secondary | ICD-10-CM | POA: Diagnosis not present

## 2022-09-02 DIAGNOSIS — M6281 Muscle weakness (generalized): Secondary | ICD-10-CM | POA: Diagnosis not present

## 2022-09-02 DIAGNOSIS — R2689 Other abnormalities of gait and mobility: Secondary | ICD-10-CM | POA: Diagnosis not present

## 2022-09-04 DIAGNOSIS — M6281 Muscle weakness (generalized): Secondary | ICD-10-CM | POA: Diagnosis not present

## 2022-09-04 DIAGNOSIS — R2689 Other abnormalities of gait and mobility: Secondary | ICD-10-CM | POA: Diagnosis not present

## 2022-09-04 DIAGNOSIS — R41841 Cognitive communication deficit: Secondary | ICD-10-CM | POA: Diagnosis not present

## 2022-09-04 DIAGNOSIS — R488 Other symbolic dysfunctions: Secondary | ICD-10-CM | POA: Diagnosis not present

## 2022-09-05 DIAGNOSIS — R488 Other symbolic dysfunctions: Secondary | ICD-10-CM | POA: Diagnosis not present

## 2022-09-05 DIAGNOSIS — M6281 Muscle weakness (generalized): Secondary | ICD-10-CM | POA: Diagnosis not present

## 2022-09-05 DIAGNOSIS — R2689 Other abnormalities of gait and mobility: Secondary | ICD-10-CM | POA: Diagnosis not present

## 2022-09-05 DIAGNOSIS — R41841 Cognitive communication deficit: Secondary | ICD-10-CM | POA: Diagnosis not present

## 2022-09-08 DIAGNOSIS — M6281 Muscle weakness (generalized): Secondary | ICD-10-CM | POA: Diagnosis not present

## 2022-09-08 DIAGNOSIS — R2689 Other abnormalities of gait and mobility: Secondary | ICD-10-CM | POA: Diagnosis not present

## 2022-09-08 DIAGNOSIS — E039 Hypothyroidism, unspecified: Secondary | ICD-10-CM | POA: Diagnosis not present

## 2022-09-08 DIAGNOSIS — I10 Essential (primary) hypertension: Secondary | ICD-10-CM | POA: Diagnosis not present

## 2022-09-08 DIAGNOSIS — I5042 Chronic combined systolic (congestive) and diastolic (congestive) heart failure: Secondary | ICD-10-CM | POA: Diagnosis not present

## 2022-09-08 DIAGNOSIS — R488 Other symbolic dysfunctions: Secondary | ICD-10-CM | POA: Diagnosis not present

## 2022-09-08 DIAGNOSIS — R41841 Cognitive communication deficit: Secondary | ICD-10-CM | POA: Diagnosis not present

## 2022-09-09 DIAGNOSIS — R2689 Other abnormalities of gait and mobility: Secondary | ICD-10-CM | POA: Diagnosis not present

## 2022-09-09 DIAGNOSIS — R41841 Cognitive communication deficit: Secondary | ICD-10-CM | POA: Diagnosis not present

## 2022-09-09 DIAGNOSIS — R488 Other symbolic dysfunctions: Secondary | ICD-10-CM | POA: Diagnosis not present

## 2022-09-09 DIAGNOSIS — M6281 Muscle weakness (generalized): Secondary | ICD-10-CM | POA: Diagnosis not present

## 2022-09-10 DIAGNOSIS — R2689 Other abnormalities of gait and mobility: Secondary | ICD-10-CM | POA: Diagnosis not present

## 2022-09-10 DIAGNOSIS — R41841 Cognitive communication deficit: Secondary | ICD-10-CM | POA: Diagnosis not present

## 2022-09-10 DIAGNOSIS — M6281 Muscle weakness (generalized): Secondary | ICD-10-CM | POA: Diagnosis not present

## 2022-09-10 DIAGNOSIS — R488 Other symbolic dysfunctions: Secondary | ICD-10-CM | POA: Diagnosis not present

## 2022-09-11 DIAGNOSIS — R41841 Cognitive communication deficit: Secondary | ICD-10-CM | POA: Diagnosis not present

## 2022-09-11 DIAGNOSIS — R2689 Other abnormalities of gait and mobility: Secondary | ICD-10-CM | POA: Diagnosis not present

## 2022-09-11 DIAGNOSIS — R488 Other symbolic dysfunctions: Secondary | ICD-10-CM | POA: Diagnosis not present

## 2022-09-11 DIAGNOSIS — M6281 Muscle weakness (generalized): Secondary | ICD-10-CM | POA: Diagnosis not present

## 2022-09-12 DIAGNOSIS — R2689 Other abnormalities of gait and mobility: Secondary | ICD-10-CM | POA: Diagnosis not present

## 2022-09-12 DIAGNOSIS — R41841 Cognitive communication deficit: Secondary | ICD-10-CM | POA: Diagnosis not present

## 2022-09-12 DIAGNOSIS — R488 Other symbolic dysfunctions: Secondary | ICD-10-CM | POA: Diagnosis not present

## 2022-09-12 DIAGNOSIS — M6281 Muscle weakness (generalized): Secondary | ICD-10-CM | POA: Diagnosis not present

## 2022-09-16 DIAGNOSIS — R488 Other symbolic dysfunctions: Secondary | ICD-10-CM | POA: Diagnosis not present

## 2022-09-16 DIAGNOSIS — R41841 Cognitive communication deficit: Secondary | ICD-10-CM | POA: Diagnosis not present

## 2022-09-16 DIAGNOSIS — M6281 Muscle weakness (generalized): Secondary | ICD-10-CM | POA: Diagnosis not present

## 2022-09-16 DIAGNOSIS — R2689 Other abnormalities of gait and mobility: Secondary | ICD-10-CM | POA: Diagnosis not present

## 2022-09-17 DIAGNOSIS — R2689 Other abnormalities of gait and mobility: Secondary | ICD-10-CM | POA: Diagnosis not present

## 2022-09-17 DIAGNOSIS — Z23 Encounter for immunization: Secondary | ICD-10-CM | POA: Diagnosis not present

## 2022-09-17 DIAGNOSIS — R488 Other symbolic dysfunctions: Secondary | ICD-10-CM | POA: Diagnosis not present

## 2022-09-17 DIAGNOSIS — R41841 Cognitive communication deficit: Secondary | ICD-10-CM | POA: Diagnosis not present

## 2022-09-17 DIAGNOSIS — M6281 Muscle weakness (generalized): Secondary | ICD-10-CM | POA: Diagnosis not present

## 2022-09-18 DIAGNOSIS — R488 Other symbolic dysfunctions: Secondary | ICD-10-CM | POA: Diagnosis not present

## 2022-09-18 DIAGNOSIS — R2689 Other abnormalities of gait and mobility: Secondary | ICD-10-CM | POA: Diagnosis not present

## 2022-09-18 DIAGNOSIS — R41841 Cognitive communication deficit: Secondary | ICD-10-CM | POA: Diagnosis not present

## 2022-09-18 DIAGNOSIS — M6281 Muscle weakness (generalized): Secondary | ICD-10-CM | POA: Diagnosis not present

## 2022-09-22 DIAGNOSIS — R2689 Other abnormalities of gait and mobility: Secondary | ICD-10-CM | POA: Diagnosis not present

## 2022-09-22 DIAGNOSIS — R41841 Cognitive communication deficit: Secondary | ICD-10-CM | POA: Diagnosis not present

## 2022-09-22 DIAGNOSIS — R488 Other symbolic dysfunctions: Secondary | ICD-10-CM | POA: Diagnosis not present

## 2022-09-22 DIAGNOSIS — M6281 Muscle weakness (generalized): Secondary | ICD-10-CM | POA: Diagnosis not present

## 2022-09-23 DIAGNOSIS — R41841 Cognitive communication deficit: Secondary | ICD-10-CM | POA: Diagnosis not present

## 2022-09-23 DIAGNOSIS — R488 Other symbolic dysfunctions: Secondary | ICD-10-CM | POA: Diagnosis not present

## 2022-09-23 DIAGNOSIS — R2689 Other abnormalities of gait and mobility: Secondary | ICD-10-CM | POA: Diagnosis not present

## 2022-09-23 DIAGNOSIS — M6281 Muscle weakness (generalized): Secondary | ICD-10-CM | POA: Diagnosis not present

## 2022-09-24 DIAGNOSIS — R2689 Other abnormalities of gait and mobility: Secondary | ICD-10-CM | POA: Diagnosis not present

## 2022-09-24 DIAGNOSIS — R41841 Cognitive communication deficit: Secondary | ICD-10-CM | POA: Diagnosis not present

## 2022-09-24 DIAGNOSIS — R488 Other symbolic dysfunctions: Secondary | ICD-10-CM | POA: Diagnosis not present

## 2022-09-24 DIAGNOSIS — M6281 Muscle weakness (generalized): Secondary | ICD-10-CM | POA: Diagnosis not present

## 2022-09-25 DIAGNOSIS — R41841 Cognitive communication deficit: Secondary | ICD-10-CM | POA: Diagnosis not present

## 2022-09-25 DIAGNOSIS — R2689 Other abnormalities of gait and mobility: Secondary | ICD-10-CM | POA: Diagnosis not present

## 2022-09-25 DIAGNOSIS — M6281 Muscle weakness (generalized): Secondary | ICD-10-CM | POA: Diagnosis not present

## 2022-09-25 DIAGNOSIS — R488 Other symbolic dysfunctions: Secondary | ICD-10-CM | POA: Diagnosis not present

## 2022-09-26 DIAGNOSIS — R2689 Other abnormalities of gait and mobility: Secondary | ICD-10-CM | POA: Diagnosis not present

## 2022-09-26 DIAGNOSIS — R41841 Cognitive communication deficit: Secondary | ICD-10-CM | POA: Diagnosis not present

## 2022-09-26 DIAGNOSIS — R488 Other symbolic dysfunctions: Secondary | ICD-10-CM | POA: Diagnosis not present

## 2022-09-26 DIAGNOSIS — M6281 Muscle weakness (generalized): Secondary | ICD-10-CM | POA: Diagnosis not present

## 2022-09-29 DIAGNOSIS — R2689 Other abnormalities of gait and mobility: Secondary | ICD-10-CM | POA: Diagnosis not present

## 2022-09-29 DIAGNOSIS — M6281 Muscle weakness (generalized): Secondary | ICD-10-CM | POA: Diagnosis not present

## 2022-09-29 DIAGNOSIS — M130 Polyarthritis, unspecified: Secondary | ICD-10-CM | POA: Diagnosis not present

## 2022-09-29 DIAGNOSIS — M25551 Pain in right hip: Secondary | ICD-10-CM | POA: Diagnosis not present

## 2022-09-29 DIAGNOSIS — R488 Other symbolic dysfunctions: Secondary | ICD-10-CM | POA: Diagnosis not present

## 2022-09-29 DIAGNOSIS — R41841 Cognitive communication deficit: Secondary | ICD-10-CM | POA: Diagnosis not present

## 2022-09-30 DIAGNOSIS — R488 Other symbolic dysfunctions: Secondary | ICD-10-CM | POA: Diagnosis not present

## 2022-09-30 DIAGNOSIS — R2689 Other abnormalities of gait and mobility: Secondary | ICD-10-CM | POA: Diagnosis not present

## 2022-09-30 DIAGNOSIS — M6281 Muscle weakness (generalized): Secondary | ICD-10-CM | POA: Diagnosis not present

## 2022-09-30 DIAGNOSIS — R41841 Cognitive communication deficit: Secondary | ICD-10-CM | POA: Diagnosis not present

## 2022-10-01 DIAGNOSIS — M6281 Muscle weakness (generalized): Secondary | ICD-10-CM | POA: Diagnosis not present

## 2022-10-01 DIAGNOSIS — R2689 Other abnormalities of gait and mobility: Secondary | ICD-10-CM | POA: Diagnosis not present

## 2022-10-01 DIAGNOSIS — R488 Other symbolic dysfunctions: Secondary | ICD-10-CM | POA: Diagnosis not present

## 2022-10-01 DIAGNOSIS — R41841 Cognitive communication deficit: Secondary | ICD-10-CM | POA: Diagnosis not present

## 2022-10-02 DIAGNOSIS — R41841 Cognitive communication deficit: Secondary | ICD-10-CM | POA: Diagnosis not present

## 2022-10-02 DIAGNOSIS — M6281 Muscle weakness (generalized): Secondary | ICD-10-CM | POA: Diagnosis not present

## 2022-10-02 DIAGNOSIS — R488 Other symbolic dysfunctions: Secondary | ICD-10-CM | POA: Diagnosis not present

## 2022-10-02 DIAGNOSIS — R2689 Other abnormalities of gait and mobility: Secondary | ICD-10-CM | POA: Diagnosis not present

## 2022-10-06 DIAGNOSIS — I10 Essential (primary) hypertension: Secondary | ICD-10-CM | POA: Diagnosis not present

## 2022-10-06 DIAGNOSIS — R2689 Other abnormalities of gait and mobility: Secondary | ICD-10-CM | POA: Diagnosis not present

## 2022-10-06 DIAGNOSIS — I5042 Chronic combined systolic (congestive) and diastolic (congestive) heart failure: Secondary | ICD-10-CM | POA: Diagnosis not present

## 2022-10-06 DIAGNOSIS — M6281 Muscle weakness (generalized): Secondary | ICD-10-CM | POA: Diagnosis not present

## 2022-10-06 DIAGNOSIS — E039 Hypothyroidism, unspecified: Secondary | ICD-10-CM | POA: Diagnosis not present

## 2022-10-06 DIAGNOSIS — R41841 Cognitive communication deficit: Secondary | ICD-10-CM | POA: Diagnosis not present

## 2022-10-06 DIAGNOSIS — D509 Iron deficiency anemia, unspecified: Secondary | ICD-10-CM | POA: Diagnosis not present

## 2022-10-06 DIAGNOSIS — R488 Other symbolic dysfunctions: Secondary | ICD-10-CM | POA: Diagnosis not present

## 2022-10-07 DIAGNOSIS — R488 Other symbolic dysfunctions: Secondary | ICD-10-CM | POA: Diagnosis not present

## 2022-10-07 DIAGNOSIS — R2689 Other abnormalities of gait and mobility: Secondary | ICD-10-CM | POA: Diagnosis not present

## 2022-10-07 DIAGNOSIS — M6281 Muscle weakness (generalized): Secondary | ICD-10-CM | POA: Diagnosis not present

## 2022-10-07 DIAGNOSIS — R41841 Cognitive communication deficit: Secondary | ICD-10-CM | POA: Diagnosis not present

## 2022-10-08 DIAGNOSIS — R2689 Other abnormalities of gait and mobility: Secondary | ICD-10-CM | POA: Diagnosis not present

## 2022-10-08 DIAGNOSIS — M6281 Muscle weakness (generalized): Secondary | ICD-10-CM | POA: Diagnosis not present

## 2022-10-08 DIAGNOSIS — R488 Other symbolic dysfunctions: Secondary | ICD-10-CM | POA: Diagnosis not present

## 2022-10-08 DIAGNOSIS — R41841 Cognitive communication deficit: Secondary | ICD-10-CM | POA: Diagnosis not present

## 2022-10-09 DIAGNOSIS — R41841 Cognitive communication deficit: Secondary | ICD-10-CM | POA: Diagnosis not present

## 2022-10-09 DIAGNOSIS — R2689 Other abnormalities of gait and mobility: Secondary | ICD-10-CM | POA: Diagnosis not present

## 2022-10-09 DIAGNOSIS — R488 Other symbolic dysfunctions: Secondary | ICD-10-CM | POA: Diagnosis not present

## 2022-10-09 DIAGNOSIS — M6281 Muscle weakness (generalized): Secondary | ICD-10-CM | POA: Diagnosis not present

## 2022-10-10 DIAGNOSIS — R488 Other symbolic dysfunctions: Secondary | ICD-10-CM | POA: Diagnosis not present

## 2022-10-10 DIAGNOSIS — M6281 Muscle weakness (generalized): Secondary | ICD-10-CM | POA: Diagnosis not present

## 2022-10-10 DIAGNOSIS — R2689 Other abnormalities of gait and mobility: Secondary | ICD-10-CM | POA: Diagnosis not present

## 2022-10-10 DIAGNOSIS — R41841 Cognitive communication deficit: Secondary | ICD-10-CM | POA: Diagnosis not present

## 2022-10-14 DIAGNOSIS — R488 Other symbolic dysfunctions: Secondary | ICD-10-CM | POA: Diagnosis not present

## 2022-10-14 DIAGNOSIS — R41841 Cognitive communication deficit: Secondary | ICD-10-CM | POA: Diagnosis not present

## 2022-10-14 DIAGNOSIS — R2689 Other abnormalities of gait and mobility: Secondary | ICD-10-CM | POA: Diagnosis not present

## 2022-10-14 DIAGNOSIS — M6281 Muscle weakness (generalized): Secondary | ICD-10-CM | POA: Diagnosis not present

## 2022-10-16 DIAGNOSIS — R2689 Other abnormalities of gait and mobility: Secondary | ICD-10-CM | POA: Diagnosis not present

## 2022-10-16 DIAGNOSIS — R41841 Cognitive communication deficit: Secondary | ICD-10-CM | POA: Diagnosis not present

## 2022-10-16 DIAGNOSIS — R488 Other symbolic dysfunctions: Secondary | ICD-10-CM | POA: Diagnosis not present

## 2022-10-16 DIAGNOSIS — M6281 Muscle weakness (generalized): Secondary | ICD-10-CM | POA: Diagnosis not present

## 2022-10-17 ENCOUNTER — Non-Acute Institutional Stay: Payer: Medicare Other | Admitting: Family Medicine

## 2022-10-17 VITALS — BP 140/70 | HR 69 | Resp 18

## 2022-10-17 DIAGNOSIS — F03918 Unspecified dementia, unspecified severity, with other behavioral disturbance: Secondary | ICD-10-CM | POA: Diagnosis not present

## 2022-10-17 DIAGNOSIS — M6281 Muscle weakness (generalized): Secondary | ICD-10-CM | POA: Diagnosis not present

## 2022-10-17 DIAGNOSIS — R2689 Other abnormalities of gait and mobility: Secondary | ICD-10-CM | POA: Diagnosis not present

## 2022-10-17 DIAGNOSIS — I270 Primary pulmonary hypertension: Secondary | ICD-10-CM | POA: Diagnosis not present

## 2022-10-17 DIAGNOSIS — S32401A Unspecified fracture of right acetabulum, initial encounter for closed fracture: Secondary | ICD-10-CM | POA: Diagnosis not present

## 2022-10-17 DIAGNOSIS — I5042 Chronic combined systolic (congestive) and diastolic (congestive) heart failure: Secondary | ICD-10-CM | POA: Insufficient documentation

## 2022-10-17 DIAGNOSIS — R488 Other symbolic dysfunctions: Secondary | ICD-10-CM | POA: Diagnosis not present

## 2022-10-17 DIAGNOSIS — R41841 Cognitive communication deficit: Secondary | ICD-10-CM | POA: Diagnosis not present

## 2022-10-17 NOTE — Progress Notes (Unsigned)
[facility] Consult Note Telephone: [phone]  Fax: [phone]    Date of encounter: 10/17/22 2:06 PM PATIENT NAME: Cindy [name] [address] [place] [address]   [phone] (home)  DOB: [birth year] MRN: [id number] PRIMARY CARE PROVIDER:    Clovia Cuff, MD,  [address] [place] [address] [phone]  REFERRING PROVIDER:   Clovia Cuff, MD [address] [place],  [place] [address] [phone]  RESPONSIBLE PARTY:    Contact Information     Name Relation Home Work Mobile   [place] Daughter   [phone]   Esmond Harps Daughter [phone]  [phone]   swan,pam Daughter [phone]  [phone]   Yates Decamp   [phone]        I met face to face with patient and family in *** home/facility. Palliative Care was asked to follow this patient by consultation request of  Clovia Cuff, MD to address advance care planning and complex medical decision making. This is a follow up visit   ASSESSMENT , SYMPTOM MANAGEMENT AND PLAN / RECOMMENDATIONS:   Advance Care Planning/Goals of Care: Goals include to maximize quality of life and symptom management. Patient/health care surrogate gave his/her permission to discuss. Our advance care planning conversation included a discussion about:    The value and importance of advance care planning  Experiences with loved ones who have been seriously ill or have died  Exploration of personal, cultural or spiritual beliefs that might influence medical decisions  Exploration of goals of care in the event of a sudden injury or illness  Identification of a healthcare agent  Review and updating or creation of an  advance directive document . Decision not to resuscitate or to de-escalate disease focused treatments due to poor prognosis. CODE STATUS:      Follow up Palliative Care Visit: Palliative care will continue to follow for  complex medical decision making, advance care planning, and clarification of goals. Return *** weeks or prn.  I  This visit was coded based on medical decision making (MDM).  PPS: ***0%  HOSPICE ELIGIBILITY/DIAGNOSIS: TBD  Chief Complaint: ***  HISTORY OF PRESENT ILLNESS:  C[name] is a 88 y.o. year old female with *** .  Requires assist with bathing, can dress/undress and groom independently.  Daughter Cindy [name] (pronounced [name]) is [facility] PO[facility] [phone].  Intermittent pain in right hip when walking.  Denies CP, SOB, nausea and vomiting..  Eats very little meat. Has a good appetite.  Denies dysuria, constipation. Denies falls.  WC bound.  No trouble with sleeping. No difficulty swallowing.  History obtained from review of EMR, discussion with primary team, and interview with family, facility staff/caregiver and/or [name].  I reviewed EMR for available labs, medications, imaging, studies and related documents.  There are no new records since last visit/Records reviewed and summarized above.   ROS General: NAD EYES: denies vision changes ENMT: denies dysphagia Cardiovascular: denies chest pain, denies DOE Pulmonary: denies cough, denies increased SOB Abdomen: endorses good appetite, denies constipation, endorses continence of bowel GU: denies dysuria, endorses continence of urine MSK:  denies increased weakness,  no falls reported Skin: denies rashes or wounds Neurological: denies pain, denies insomnia Psych: Endorses positive mood Heme/lymph/immuno: denies bruises, abnormal bleeding  Physical Exam: Current and past weights: Constitutional: NAD General: frail appearing, thin/WNWD/obese  EYES: anicteric sclera, lids intact, no discharge  ENMT: intact hearing, oral mucous membranes moist, dentition intact CV: S1S2, RRR, no LE edema Pulmonary: LCTA, no increased  work of breathing, no cough, room air Abdomen: intake 100%, normo-active BS + 4 quadrants, soft and non  tender, no ascites GU: deferred MSK: no sarcopenia, moves all extremities, ambulatory Skin: warm and dry, no rashes or wounds on visible skin Neuro:  no generalized weakness,  no cognitive impairment Psych: non-anxious affect, A and O x 3 Hem/lymph/immuno: no widespread bruising   Thank you for the opportunity to participate in the care of Cindy [name].  The palliative care team will continue to follow. Please call our office at [phone] if we can be of additional assistance.   Marijo Conception, FNP -C  COVID-19 PATIENT SCREENING TOOL Asked and negative response unless otherwise noted:   Have you had symptoms of covid, tested positive or been in contact with someone with symptoms/positive test in the past 5-10 days?

## 2022-10-18 ENCOUNTER — Encounter: Payer: Self-pay | Admitting: Family Medicine

## 2022-10-18 DIAGNOSIS — I270 Primary pulmonary hypertension: Secondary | ICD-10-CM | POA: Insufficient documentation

## 2022-10-18 NOTE — Assessment & Plan Note (Signed)
As of 06/2021:  Chronic right acetabular fracture with deformity.  Cindy Hippo FNP-C

## 2022-10-19 DIAGNOSIS — R2689 Other abnormalities of gait and mobility: Secondary | ICD-10-CM | POA: Diagnosis not present

## 2022-10-19 DIAGNOSIS — R488 Other symbolic dysfunctions: Secondary | ICD-10-CM | POA: Diagnosis not present

## 2022-10-19 DIAGNOSIS — R41841 Cognitive communication deficit: Secondary | ICD-10-CM | POA: Diagnosis not present

## 2022-10-19 DIAGNOSIS — M6281 Muscle weakness (generalized): Secondary | ICD-10-CM | POA: Diagnosis not present

## 2022-10-21 DIAGNOSIS — R488 Other symbolic dysfunctions: Secondary | ICD-10-CM | POA: Diagnosis not present

## 2022-10-21 DIAGNOSIS — R41841 Cognitive communication deficit: Secondary | ICD-10-CM | POA: Diagnosis not present

## 2022-10-21 DIAGNOSIS — R2689 Other abnormalities of gait and mobility: Secondary | ICD-10-CM | POA: Diagnosis not present

## 2022-10-21 DIAGNOSIS — M6281 Muscle weakness (generalized): Secondary | ICD-10-CM | POA: Diagnosis not present

## 2022-10-22 DIAGNOSIS — R488 Other symbolic dysfunctions: Secondary | ICD-10-CM | POA: Diagnosis not present

## 2022-10-22 DIAGNOSIS — R2689 Other abnormalities of gait and mobility: Secondary | ICD-10-CM | POA: Diagnosis not present

## 2022-10-22 DIAGNOSIS — R41841 Cognitive communication deficit: Secondary | ICD-10-CM | POA: Diagnosis not present

## 2022-10-22 DIAGNOSIS — M6281 Muscle weakness (generalized): Secondary | ICD-10-CM | POA: Diagnosis not present

## 2022-10-23 DIAGNOSIS — R41841 Cognitive communication deficit: Secondary | ICD-10-CM | POA: Diagnosis not present

## 2022-10-23 DIAGNOSIS — R488 Other symbolic dysfunctions: Secondary | ICD-10-CM | POA: Diagnosis not present

## 2022-10-23 DIAGNOSIS — M6281 Muscle weakness (generalized): Secondary | ICD-10-CM | POA: Diagnosis not present

## 2022-10-23 DIAGNOSIS — R2689 Other abnormalities of gait and mobility: Secondary | ICD-10-CM | POA: Diagnosis not present

## 2022-10-24 DIAGNOSIS — R41841 Cognitive communication deficit: Secondary | ICD-10-CM | POA: Diagnosis not present

## 2022-10-24 DIAGNOSIS — R2689 Other abnormalities of gait and mobility: Secondary | ICD-10-CM | POA: Diagnosis not present

## 2022-10-24 DIAGNOSIS — R488 Other symbolic dysfunctions: Secondary | ICD-10-CM | POA: Diagnosis not present

## 2022-10-24 DIAGNOSIS — M6281 Muscle weakness (generalized): Secondary | ICD-10-CM | POA: Diagnosis not present

## 2022-10-27 DIAGNOSIS — R488 Other symbolic dysfunctions: Secondary | ICD-10-CM | POA: Diagnosis not present

## 2022-10-27 DIAGNOSIS — R2689 Other abnormalities of gait and mobility: Secondary | ICD-10-CM | POA: Diagnosis not present

## 2022-10-27 DIAGNOSIS — R41841 Cognitive communication deficit: Secondary | ICD-10-CM | POA: Diagnosis not present

## 2022-10-27 DIAGNOSIS — M6281 Muscle weakness (generalized): Secondary | ICD-10-CM | POA: Diagnosis not present

## 2022-10-28 DIAGNOSIS — R41841 Cognitive communication deficit: Secondary | ICD-10-CM | POA: Diagnosis not present

## 2022-10-28 DIAGNOSIS — R2689 Other abnormalities of gait and mobility: Secondary | ICD-10-CM | POA: Diagnosis not present

## 2022-10-28 DIAGNOSIS — M6281 Muscle weakness (generalized): Secondary | ICD-10-CM | POA: Diagnosis not present

## 2022-10-28 DIAGNOSIS — R488 Other symbolic dysfunctions: Secondary | ICD-10-CM | POA: Diagnosis not present

## 2022-10-30 DIAGNOSIS — R41841 Cognitive communication deficit: Secondary | ICD-10-CM | POA: Diagnosis not present

## 2022-10-30 DIAGNOSIS — R488 Other symbolic dysfunctions: Secondary | ICD-10-CM | POA: Diagnosis not present

## 2022-10-30 DIAGNOSIS — R2689 Other abnormalities of gait and mobility: Secondary | ICD-10-CM | POA: Diagnosis not present

## 2022-10-30 DIAGNOSIS — M6281 Muscle weakness (generalized): Secondary | ICD-10-CM | POA: Diagnosis not present

## 2022-10-31 DIAGNOSIS — M6281 Muscle weakness (generalized): Secondary | ICD-10-CM | POA: Diagnosis not present

## 2022-10-31 DIAGNOSIS — R488 Other symbolic dysfunctions: Secondary | ICD-10-CM | POA: Diagnosis not present

## 2022-10-31 DIAGNOSIS — R41841 Cognitive communication deficit: Secondary | ICD-10-CM | POA: Diagnosis not present

## 2022-10-31 DIAGNOSIS — R2689 Other abnormalities of gait and mobility: Secondary | ICD-10-CM | POA: Diagnosis not present

## 2022-11-03 DIAGNOSIS — I10 Essential (primary) hypertension: Secondary | ICD-10-CM | POA: Diagnosis not present

## 2022-11-03 DIAGNOSIS — M6281 Muscle weakness (generalized): Secondary | ICD-10-CM | POA: Diagnosis not present

## 2022-11-03 DIAGNOSIS — I5042 Chronic combined systolic (congestive) and diastolic (congestive) heart failure: Secondary | ICD-10-CM | POA: Diagnosis not present

## 2022-11-03 DIAGNOSIS — D509 Iron deficiency anemia, unspecified: Secondary | ICD-10-CM | POA: Diagnosis not present

## 2022-11-03 DIAGNOSIS — R488 Other symbolic dysfunctions: Secondary | ICD-10-CM | POA: Diagnosis not present

## 2022-11-03 DIAGNOSIS — R41841 Cognitive communication deficit: Secondary | ICD-10-CM | POA: Diagnosis not present

## 2022-11-03 DIAGNOSIS — R2689 Other abnormalities of gait and mobility: Secondary | ICD-10-CM | POA: Diagnosis not present

## 2022-11-03 DIAGNOSIS — E039 Hypothyroidism, unspecified: Secondary | ICD-10-CM | POA: Diagnosis not present

## 2022-11-05 DIAGNOSIS — R2689 Other abnormalities of gait and mobility: Secondary | ICD-10-CM | POA: Diagnosis not present

## 2022-11-05 DIAGNOSIS — M6281 Muscle weakness (generalized): Secondary | ICD-10-CM | POA: Diagnosis not present

## 2022-11-05 DIAGNOSIS — R488 Other symbolic dysfunctions: Secondary | ICD-10-CM | POA: Diagnosis not present

## 2022-11-05 DIAGNOSIS — R41841 Cognitive communication deficit: Secondary | ICD-10-CM | POA: Diagnosis not present

## 2022-11-06 DIAGNOSIS — R41841 Cognitive communication deficit: Secondary | ICD-10-CM | POA: Diagnosis not present

## 2022-11-06 DIAGNOSIS — R2689 Other abnormalities of gait and mobility: Secondary | ICD-10-CM | POA: Diagnosis not present

## 2022-11-06 DIAGNOSIS — M6281 Muscle weakness (generalized): Secondary | ICD-10-CM | POA: Diagnosis not present

## 2022-11-06 DIAGNOSIS — R488 Other symbolic dysfunctions: Secondary | ICD-10-CM | POA: Diagnosis not present

## 2022-11-07 DIAGNOSIS — R41841 Cognitive communication deficit: Secondary | ICD-10-CM | POA: Diagnosis not present

## 2022-11-07 DIAGNOSIS — R488 Other symbolic dysfunctions: Secondary | ICD-10-CM | POA: Diagnosis not present

## 2022-11-07 DIAGNOSIS — R2689 Other abnormalities of gait and mobility: Secondary | ICD-10-CM | POA: Diagnosis not present

## 2022-11-07 DIAGNOSIS — M6281 Muscle weakness (generalized): Secondary | ICD-10-CM | POA: Diagnosis not present

## 2022-11-10 DIAGNOSIS — M130 Polyarthritis, unspecified: Secondary | ICD-10-CM | POA: Diagnosis not present

## 2022-11-10 DIAGNOSIS — R2689 Other abnormalities of gait and mobility: Secondary | ICD-10-CM | POA: Diagnosis not present

## 2022-11-10 DIAGNOSIS — M6281 Muscle weakness (generalized): Secondary | ICD-10-CM | POA: Diagnosis not present

## 2022-11-10 DIAGNOSIS — R488 Other symbolic dysfunctions: Secondary | ICD-10-CM | POA: Diagnosis not present

## 2022-11-10 DIAGNOSIS — R41841 Cognitive communication deficit: Secondary | ICD-10-CM | POA: Diagnosis not present

## 2022-11-10 DIAGNOSIS — M25551 Pain in right hip: Secondary | ICD-10-CM | POA: Diagnosis not present

## 2022-11-11 ENCOUNTER — Non-Acute Institutional Stay: Payer: Medicare Other | Admitting: Family Medicine

## 2022-11-11 ENCOUNTER — Encounter: Payer: Self-pay | Admitting: Family Medicine

## 2022-11-11 VITALS — BP 118/50 | HR 84 | Temp 97.4°F | Resp 16

## 2022-11-11 DIAGNOSIS — D509 Iron deficiency anemia, unspecified: Secondary | ICD-10-CM | POA: Diagnosis not present

## 2022-11-11 DIAGNOSIS — F03918 Unspecified dementia, unspecified severity, with other behavioral disturbance: Secondary | ICD-10-CM | POA: Diagnosis not present

## 2022-11-11 DIAGNOSIS — G301 Alzheimer's disease with late onset: Secondary | ICD-10-CM | POA: Diagnosis not present
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
## 2022-11-12 DIAGNOSIS — R2689 Other abnormalities of gait and mobility: Secondary | ICD-10-CM | POA: Diagnosis not present

## 2022-11-12 DIAGNOSIS — R41841 Cognitive communication deficit: Secondary | ICD-10-CM | POA: Diagnosis not present

## 2022-11-12 DIAGNOSIS — R488 Other symbolic dysfunctions: Secondary | ICD-10-CM | POA: Diagnosis not present

## 2022-11-12 DIAGNOSIS — M6281 Muscle weakness (generalized): Secondary | ICD-10-CM | POA: Diagnosis not present

## 2022-11-14 DIAGNOSIS — R41841 Cognitive communication deficit: Secondary | ICD-10-CM | POA: Diagnosis not present

## 2022-11-14 DIAGNOSIS — R2689 Other abnormalities of gait and mobility: Secondary | ICD-10-CM | POA: Diagnosis not present

## 2022-11-14 DIAGNOSIS — M6281 Muscle weakness (generalized): Secondary | ICD-10-CM | POA: Diagnosis not present

## 2022-11-14 DIAGNOSIS — R488 Other symbolic dysfunctions: Secondary | ICD-10-CM | POA: Diagnosis not present

## 2022-11-17 DIAGNOSIS — M6281 Muscle weakness (generalized): Secondary | ICD-10-CM | POA: Diagnosis not present

## 2022-11-17 DIAGNOSIS — R41841 Cognitive communication deficit: Secondary | ICD-10-CM | POA: Diagnosis not present

## 2022-11-17 DIAGNOSIS — R2689 Other abnormalities of gait and mobility: Secondary | ICD-10-CM | POA: Diagnosis not present

## 2022-11-17 DIAGNOSIS — R488 Other symbolic dysfunctions: Secondary | ICD-10-CM | POA: Diagnosis not present

## 2022-11-18 DIAGNOSIS — R41841 Cognitive communication deficit: Secondary | ICD-10-CM | POA: Diagnosis not present

## 2022-11-18 DIAGNOSIS — R488 Other symbolic dysfunctions: Secondary | ICD-10-CM | POA: Diagnosis not present

## 2022-11-18 DIAGNOSIS — R2689 Other abnormalities of gait and mobility: Secondary | ICD-10-CM | POA: Diagnosis not present

## 2022-11-18 DIAGNOSIS — M6281 Muscle weakness (generalized): Secondary | ICD-10-CM | POA: Diagnosis not present

## 2022-11-20 DIAGNOSIS — R488 Other symbolic dysfunctions: Secondary | ICD-10-CM | POA: Diagnosis not present

## 2022-11-20 DIAGNOSIS — R41841 Cognitive communication deficit: Secondary | ICD-10-CM | POA: Diagnosis not present

## 2022-11-20 DIAGNOSIS — R2689 Other abnormalities of gait and mobility: Secondary | ICD-10-CM | POA: Diagnosis not present

## 2022-11-20 DIAGNOSIS — M6281 Muscle weakness (generalized): Secondary | ICD-10-CM | POA: Diagnosis not present

## 2022-11-24 DIAGNOSIS — R488 Other symbolic dysfunctions: Secondary | ICD-10-CM | POA: Diagnosis not present

## 2022-11-24 DIAGNOSIS — R2689 Other abnormalities of gait and mobility: Secondary | ICD-10-CM | POA: Diagnosis not present

## 2022-11-24 DIAGNOSIS — M6281 Muscle weakness (generalized): Secondary | ICD-10-CM | POA: Diagnosis not present

## 2022-11-24 DIAGNOSIS — R41841 Cognitive communication deficit: Secondary | ICD-10-CM | POA: Diagnosis not present

## 2022-11-25 DIAGNOSIS — R41841 Cognitive communication deficit: Secondary | ICD-10-CM | POA: Diagnosis not present

## 2022-11-25 DIAGNOSIS — M6281 Muscle weakness (generalized): Secondary | ICD-10-CM | POA: Diagnosis not present

## 2022-11-25 DIAGNOSIS — R488 Other symbolic dysfunctions: Secondary | ICD-10-CM | POA: Diagnosis not present

## 2022-11-25 DIAGNOSIS — R2689 Other abnormalities of gait and mobility: Secondary | ICD-10-CM | POA: Diagnosis not present

## 2022-12-01 DIAGNOSIS — K219 Gastro-esophageal reflux disease without esophagitis: Secondary | ICD-10-CM | POA: Diagnosis not present

## 2022-12-01 DIAGNOSIS — D509 Iron deficiency anemia, unspecified: Secondary | ICD-10-CM | POA: Diagnosis not present

## 2022-12-01 DIAGNOSIS — M6281 Muscle weakness (generalized): Secondary | ICD-10-CM | POA: Diagnosis not present

## 2022-12-01 DIAGNOSIS — R488 Other symbolic dysfunctions: Secondary | ICD-10-CM | POA: Diagnosis not present

## 2022-12-01 DIAGNOSIS — R41841 Cognitive communication deficit: Secondary | ICD-10-CM | POA: Diagnosis not present

## 2022-12-01 DIAGNOSIS — E559 Vitamin D deficiency, unspecified: Secondary | ICD-10-CM | POA: Diagnosis not present

## 2022-12-01 DIAGNOSIS — E876 Hypokalemia: Secondary | ICD-10-CM | POA: Diagnosis not present

## 2022-12-01 DIAGNOSIS — E039 Hypothyroidism, unspecified: Secondary | ICD-10-CM | POA: Diagnosis not present

## 2022-12-01 DIAGNOSIS — I5042 Chronic combined systolic (congestive) and diastolic (congestive) heart failure: Secondary | ICD-10-CM | POA: Diagnosis not present

## 2022-12-01 DIAGNOSIS — I10 Essential (primary) hypertension: Secondary | ICD-10-CM | POA: Diagnosis not present

## 2022-12-01 DIAGNOSIS — R2689 Other abnormalities of gait and mobility: Secondary | ICD-10-CM | POA: Diagnosis not present

## 2022-12-02 DIAGNOSIS — R488 Other symbolic dysfunctions: Secondary | ICD-10-CM | POA: Diagnosis not present

## 2022-12-02 DIAGNOSIS — R2689 Other abnormalities of gait and mobility: Secondary | ICD-10-CM | POA: Diagnosis not present

## 2022-12-02 DIAGNOSIS — R41841 Cognitive communication deficit: Secondary | ICD-10-CM | POA: Diagnosis not present

## 2022-12-02 DIAGNOSIS — M6281 Muscle weakness (generalized): Secondary | ICD-10-CM | POA: Diagnosis not present

## 2022-12-03 DIAGNOSIS — R2689 Other abnormalities of gait and mobility: Secondary | ICD-10-CM | POA: Diagnosis not present

## 2022-12-03 DIAGNOSIS — R41841 Cognitive communication deficit: Secondary | ICD-10-CM | POA: Diagnosis not present

## 2022-12-03 DIAGNOSIS — M6281 Muscle weakness (generalized): Secondary | ICD-10-CM | POA: Diagnosis not present

## 2022-12-03 DIAGNOSIS — R488 Other symbolic dysfunctions: Secondary | ICD-10-CM | POA: Diagnosis not present

## 2022-12-04 DIAGNOSIS — R2689 Other abnormalities of gait and mobility: Secondary | ICD-10-CM | POA: Diagnosis not present

## 2022-12-04 DIAGNOSIS — M6281 Muscle weakness (generalized): Secondary | ICD-10-CM | POA: Diagnosis not present

## 2022-12-04 DIAGNOSIS — R41841 Cognitive communication deficit: Secondary | ICD-10-CM | POA: Diagnosis not present

## 2022-12-04 DIAGNOSIS — R488 Other symbolic dysfunctions: Secondary | ICD-10-CM | POA: Diagnosis not present

## 2022-12-08 DIAGNOSIS — M6281 Muscle weakness (generalized): Secondary | ICD-10-CM | POA: Diagnosis not present

## 2022-12-08 DIAGNOSIS — R488 Other symbolic dysfunctions: Secondary | ICD-10-CM | POA: Diagnosis not present

## 2022-12-08 DIAGNOSIS — G301 Alzheimer's disease with late onset: Secondary | ICD-10-CM | POA: Diagnosis not present

## 2022-12-08 DIAGNOSIS — M130 Polyarthritis, unspecified: Secondary | ICD-10-CM | POA: Diagnosis not present

## 2022-12-08 DIAGNOSIS — R2689 Other abnormalities of gait and mobility: Secondary | ICD-10-CM | POA: Diagnosis not present

## 2022-12-08 DIAGNOSIS — R41841 Cognitive communication deficit: Secondary | ICD-10-CM | POA: Diagnosis not present

## 2022-12-08 DIAGNOSIS — M25551 Pain in right hip: Secondary | ICD-10-CM | POA: Diagnosis not present

## 2022-12-08 DIAGNOSIS — D509 Iron deficiency anemia, unspecified: Secondary | ICD-10-CM | POA: Diagnosis not present

## 2022-12-09 DIAGNOSIS — M6281 Muscle weakness (generalized): Secondary | ICD-10-CM | POA: Diagnosis not present

## 2022-12-09 DIAGNOSIS — R2689 Other abnormalities of gait and mobility: Secondary | ICD-10-CM | POA: Diagnosis not present

## 2022-12-09 DIAGNOSIS — R41841 Cognitive communication deficit: Secondary | ICD-10-CM | POA: Diagnosis not present

## 2022-12-09 DIAGNOSIS — R488 Other symbolic dysfunctions: Secondary | ICD-10-CM | POA: Diagnosis not present

## 2022-12-10 DIAGNOSIS — R488 Other symbolic dysfunctions: Secondary | ICD-10-CM | POA: Diagnosis not present

## 2022-12-10 DIAGNOSIS — R2689 Other abnormalities of gait and mobility: Secondary | ICD-10-CM | POA: Diagnosis not present

## 2022-12-10 DIAGNOSIS — M6281 Muscle weakness (generalized): Secondary | ICD-10-CM | POA: Diagnosis not present

## 2022-12-10 DIAGNOSIS — R41841 Cognitive communication deficit: Secondary | ICD-10-CM | POA: Diagnosis not present

## 2022-12-11 DIAGNOSIS — R41841 Cognitive communication deficit: Secondary | ICD-10-CM | POA: Diagnosis not present

## 2022-12-11 DIAGNOSIS — M6281 Muscle weakness (generalized): Secondary | ICD-10-CM | POA: Diagnosis not present

## 2022-12-11 DIAGNOSIS — R2689 Other abnormalities of gait and mobility: Secondary | ICD-10-CM | POA: Diagnosis not present

## 2022-12-11 DIAGNOSIS — R488 Other symbolic dysfunctions: Secondary | ICD-10-CM | POA: Diagnosis not present

## 2022-12-12 DIAGNOSIS — R2689 Other abnormalities of gait and mobility: Secondary | ICD-10-CM | POA: Diagnosis not present

## 2022-12-12 DIAGNOSIS — R488 Other symbolic dysfunctions: Secondary | ICD-10-CM | POA: Diagnosis not present

## 2022-12-12 DIAGNOSIS — R41841 Cognitive communication deficit: Secondary | ICD-10-CM | POA: Diagnosis not present

## 2022-12-12 DIAGNOSIS — M6281 Muscle weakness (generalized): Secondary | ICD-10-CM | POA: Diagnosis not present

## 2022-12-16 DIAGNOSIS — M6281 Muscle weakness (generalized): Secondary | ICD-10-CM | POA: Diagnosis not present

## 2022-12-16 DIAGNOSIS — R41841 Cognitive communication deficit: Secondary | ICD-10-CM | POA: Diagnosis not present

## 2022-12-16 DIAGNOSIS — R488 Other symbolic dysfunctions: Secondary | ICD-10-CM | POA: Diagnosis not present

## 2022-12-16 DIAGNOSIS — R2689 Other abnormalities of gait and mobility: Secondary | ICD-10-CM | POA: Diagnosis not present

## 2022-12-18 ENCOUNTER — Emergency Department (HOSPITAL_COMMUNITY)
Admission: EM | Admit: 2022-12-18 | Discharge: 2022-12-18 | Disposition: A | Discharge status: Home / Self Care | Payer: Medicare Other | Attending: Emergency Medicine | Admitting: Emergency Medicine

## 2022-12-18 ENCOUNTER — Emergency Department (HOSPITAL_COMMUNITY): Payer: Medicare Other

## 2022-12-18 DIAGNOSIS — I5043 Acute on chronic combined systolic (congestive) and diastolic (congestive) heart failure: Secondary | ICD-10-CM | POA: Diagnosis not present

## 2022-12-18 DIAGNOSIS — S3210XA Unspecified fracture of sacrum, initial encounter for closed fracture: Secondary | ICD-10-CM | POA: Insufficient documentation

## 2022-12-18 DIAGNOSIS — E039 Hypothyroidism, unspecified: Secondary | ICD-10-CM | POA: Diagnosis not present

## 2022-12-18 DIAGNOSIS — R41841 Cognitive communication deficit: Secondary | ICD-10-CM | POA: Diagnosis not present

## 2022-12-18 DIAGNOSIS — R488 Other symbolic dysfunctions: Secondary | ICD-10-CM | POA: Diagnosis not present

## 2022-12-18 DIAGNOSIS — W06XXXA Fall from bed, initial encounter: Secondary | ICD-10-CM | POA: Insufficient documentation

## 2022-12-18 DIAGNOSIS — M549 Dorsalgia, unspecified: Secondary | ICD-10-CM | POA: Diagnosis not present

## 2022-12-18 DIAGNOSIS — Z85038 Personal history of other malignant neoplasm of large intestine: Secondary | ICD-10-CM | POA: Insufficient documentation

## 2022-12-18 DIAGNOSIS — Z85828 Personal history of other malignant neoplasm of skin: Secondary | ICD-10-CM | POA: Insufficient documentation

## 2022-12-18 DIAGNOSIS — I10 Essential (primary) hypertension: Secondary | ICD-10-CM | POA: Diagnosis not present

## 2022-12-18 DIAGNOSIS — I110 Hypertensive heart disease with heart failure: Secondary | ICD-10-CM | POA: Insufficient documentation

## 2022-12-18 DIAGNOSIS — Z79899 Other long term (current) drug therapy: Secondary | ICD-10-CM | POA: Insufficient documentation

## 2022-12-18 DIAGNOSIS — S32010A Wedge compression fracture of first lumbar vertebra, initial encounter for closed fracture: Secondary | ICD-10-CM | POA: Diagnosis not present

## 2022-12-18 DIAGNOSIS — Y92129 Unspecified place in nursing home as the place of occurrence of the external cause: Secondary | ICD-10-CM | POA: Diagnosis not present

## 2022-12-18 DIAGNOSIS — M545 Low back pain, unspecified: Secondary | ICD-10-CM | POA: Diagnosis present

## 2022-12-18 DIAGNOSIS — R2689 Other abnormalities of gait and mobility: Secondary | ICD-10-CM | POA: Diagnosis not present

## 2022-12-18 DIAGNOSIS — M6281 Muscle weakness (generalized): Secondary | ICD-10-CM | POA: Diagnosis not present

## 2022-12-18 MED ORDER — ACETAMINOPHEN 325 MG PO TABS
650.0000 mg | ORAL_TABLET | Freq: Once | ORAL | Status: AC
  Administered 2022-12-18: 650 mg via ORAL
  Filled 2022-12-18: qty 2

## 2022-12-18 NOTE — ED Triage Notes (Signed)
Pt from [facility] assisted [facility]. Pt rolled out of bed and hit her lower lumbar on the floor. Pt is Aox4 and ambulatory at home.

## 2022-12-18 NOTE — ED Provider Notes (Signed)
[facility] DEPT Provider Note: Georgena Spurling, MD, FACEP  CSN: [id number] MRN: [id number] ARRIVAL: 12/18/22 at Centre: [place]   HISTORY OF PRESENT ILLNESS  12/18/22 5:40 AM Cindy [name] is a 88 y.o. female who rolled out of bed at nursing home just prior to arrival.  She landed on her backside and has pain in her lumbar region.  She rates the pain as a 4 out of 10.  She denies other injury.  She is not on anticoagulation.  She normally gets around in a wheelchair but does get up with a walker with physical therapy weekly.   Past Medical History:  Diagnosis Date   Acute on chronic combined systolic (congestive) and diastolic (congestive) heart failure ([place]) 05/01/2021   Acute respiratory failure with hypoxia ([place]) 07/28/2020   Arthritis    Cancer ([place])    skin   Chest pain 12/25/2013   Colitis, acute 03/17/2013   Colon cancer ([place]) 08/31/13   invasive squamous cell   Deaf, left    Depression    Febrile neutropenia ([place]) 10/23/2013   Gastroparesis 10/22/2012   GERD (gastroesophageal reflux disease)    History of radiation therapy 10/10/13-11/23/13   anal canal 54GY/   Hyperlipemia    Hypertension    Hypokalemia 10/17/2012   Hypomagnesemia 10/24/2013   Hyponatremia 10/17/2012   Hypophosphatemia 10/27/2013   Hypothyroidism    Mucositis 10/27/2013   PONV (postoperative nausea and vomiting)    Pulmonary embolism ([place]) 07/28/2020   Pulmonary embolus ([place]) 07/28/2020   Sepsis ([place]) 03/09/2017   Skin cancer    Shingles 2012 Bad case    Past Surgical History:  Procedure Laterality Date   ABDOMINAL HYSTERECTOMY     APPENDECTOMY     CESAREAN SECTION     2   CHOLECYSTECTOMY     COLONOSCOPY  08/31/13   invasive squamous cell colon   ERCP     ESOPHAGOGASTRODUODENOSCOPY  10/20/2012   Procedure: ESOPHAGOGASTRODUODENOSCOPY (EGD);  Surgeon: Arta Silence, MD;  Location: [facility];  Service: Endoscopy;  Laterality: Left;   EYE SURGERY      cataracts   KNEE ARTHROSCOPY  05/11/2012   Procedure: ARTHROSCOPY KNEE;  Surgeon: Hessie Dibble, MD;  Location: [facility];  Service: Orthopedics;  Laterality: Right;  left knee medial menisectomy and chondroplasty   THYROIDECTOMY, PARTIAL     TONSILLECTOMY      Family History  Problem Relation Age of Onset   Kidney disease Mother    Heart attack Father    Heart disease Father    Cancer Sister        breast   Cancer Sister        ovarian   Cancer Sister        melanoma   Cancer Daughter        breast    Social History   Tobacco Use   Smoking status: Never   Smokeless tobacco: Never  Vaping Use   Vaping Use: Never used  Substance Use Topics   Alcohol use: No   Drug use: No    Prior to Admission medications   Medication Sig Start Date End Date Taking? Authorizing Provider  amLODipine (NORVASC) 5 MG tablet Take 5 mg by mouth daily.    [provider]  ASA-APAP-CAFF BUFFERED PO Take 250 mg by mouth 3 (three) times daily. Takes 2 tabs po TiD Apap 250 mg/ASA 250 mg/Caffeine 65 mg (takes 2 tabs)  [provider]  buPROPion (WELLBUTRIN SR) 200 MG 12 hr tablet Take 200 mg by mouth daily.     [provider]  busPIRone (BUSPAR) 5 MG tablet Take 5 mg by mouth 3 (three) times daily.    [provider]  carvedilol (COREG) 6.25 MG tablet Take 1 tablet (6.25 mg total) by mouth 2 (two) times daily. 09/05/20   Martinique, Peter M, MD  docusate sodium (COLACE) 100 MG capsule Take 100 mg by mouth 2 (two) times daily.    [provider]  escitalopram (LEXAPRO) 20 MG tablet Take 20 mg by mouth daily.    [provider]  ferrous sulfate 325 (65 FE) MG tablet Take 325 mg by mouth daily with breakfast.    [provider]  folic acid (FOLVITE) 1 MG tablet Take 1 mg by mouth daily.    [provider]  gabapentin (NEURONTIN) 300 MG capsule Take 300 mg by mouth at bedtime.    [provider]   levothyroxine (SYNTHROID) 112 MCG tablet Take 112 mcg by mouth daily.    [provider]  loperamide (IMODIUM) 2 MG capsule Take 2 mg by mouth 4 (four) times daily as needed for diarrhea or loose stools.    [provider]  losartan (COZAAR) 50 MG tablet Take 100 mg by mouth daily. 05/13/21   [provider]  Methen-Hyosc-Meth Blue-Na Phos (ME/NAPHOS/MB/HYO1 PO) Take 1 tablet by mouth 2 (two) times daily.    [provider]  ondansetron (ZOFRAN) 4 MG tablet Take 4 mg by mouth every 8 (eight) hours as needed for nausea/vomiting. 10/04/20   [provider]  polyethylene glycol (MIRALAX / GLYCOLAX) 17 g packet Take 17 g by mouth See admin instructions. 17g every other day as needed for constipation    [provider]  traMADol (ULTRAM) 50 MG tablet Take 50 mg by mouth 2 (two) times daily.    [provider]    Allergies Vancomycin, Zosyn [piperacillin sod-tazobactam so], Hydrocodone, and Morphine and related   REVIEW OF SYSTEMS  Negative except as noted here or in the History of Present Illness.   PHYSICAL EXAMINATION  Initial Vital Signs Blood pressure (!) 217/87, pulse 98, temperature 98.9 F (37.2 C), temperature source Oral, resp. rate 18, SpO2 93 %.  Examination General: Well-developed, well-nourished female in no acute distress; appearance consistent with age of record HENT: normocephalic; atraumatic Eyes: Normal appearance Neck: supple Heart: regular rate and rhythm Lungs: clear to auscultation bilaterally Abdomen: soft; nondistended; nontender; bowel sounds present Back: Midline lumbar tenderness Extremities: No acute deformity; full range of motion; pulses normal Neurologic: Awake, alert and oriented; motor function intact in all extremities and symmetric; no facial droop Skin: Warm and dry Psychiatric: Normal mood and affect   RESULTS  Summary of this visit's results, reviewed and interpreted by myself:    EKG Interpretation  Date/Time:    Ventricular Rate:    PR Interval:    QRS Duration:   QT Interval:    QTC Calculation:   R Axis:     Text Interpretation:         Laboratory Studies: No results found for this or any previous visit (from the past 24 hour(s)). Imaging Studies: CT Lumbar Spine Wo Contrast  Result Date: 12/18/2022 CLINICAL DATA:  88 year old female status post fall from bed.  Pain. EXAM: CT LUMBAR SPINE WITHOUT CONTRAST TECHNIQUE: Multidetector CT imaging of the lumbar spine was performed without intravenous contrast administration. Multiplanar CT image reconstructions  were also generated. RADIATION DOSE REDUCTION: This exam was performed according to the departmental dose-optimization program which includes automated exposure control, adjustment of the mA and/or kV according to patient size and/or use of iterative reconstruction technique. COMPARISON:  CT Abdomen and Pelvis 07/09/2021. FINDINGS: Segmentation: Normal. Alignment: Overall lumbar lordosis is stable from last year. No significant scoliosis or spondylolisthesis. Vertebrae: Generalized osteopenia. Visible lower thoracic levels through T12 appear to be intact. L1 compression fracture. Superior endplate deformity which is mildly comminuted. 25% loss of height. Mild retropulsion of the posterosuperior endplate, but no significant L1 spinal stenosis. Pedicles and posterior elements appear intact. L2 through L4 appears stable and intact. Chronic L5-S1 ankylosis. Those levels appears stable and intact. There is a new ventral cortical defect at the sacral S3 segment which is minimally included on sagittal image 36. But otherwise the visible osteopenic sacrum and SI joints appear grossly stable and intact. Paraspinal and other soft tissues: Layering pleural effusions have regressed from last year with trace residual. Mild respiratory motion and atelectasis at the visible lung bases. Aortoiliac calcified atherosclerosis. Negative  for abdominal aortic aneurysm. Vascular patency is not evaluated in the absence of IV contrast. Negative visible other noncontrast abdominal viscera. Minimal paraspinal edema or inflammation at the L1 level. Other paraspinal soft tissues remain within normal limits. Disc levels: Underlying chronic lumbar spine degeneration is stable from last year. Multifactorial spinal stenosis is most pronounced at L4-L5. IMPRESSION: 1. L1 compression fracture with 25% loss of height. Mild retropulsion but no significant spinal stenosis. If specific therapy is desired, Lumbar MRI without contrast or Nuclear Medicine Whole-body Bone Scan would confirm candidacy for vertebroplasty. 2. Evidence of a minimally displaced fracture of the central sacrum at S3. Visible osteopenic sacrum otherwise grossly intact. 3. Chronic lumbar spine degeneration. Aortic Atherosclerosis (ICD10-I70.0). Electronically Signed   By: Genevie Ann M.D.   On: 12/18/2022 06:13    ED COURSE and MDM  Nursing notes, initial and subsequent vitals signs, including pulse oximetry, reviewed and interpreted by myself.  Vitals:   12/18/22 0539  BP: (!) 217/87  Pulse: 98  Resp: 18  Temp: 98.9 F (37.2 C)  TempSrc: Oral  SpO2: 93%   Medications  acetaminophen (TYLENOL) tablet 650 mg (has no administration in time range)   6:27 AM The patient is in minimal discomfort and her daughter states that she is normally wheelchair-bound.  Her daughter does not want any further testing or consideration of procedures like kyphoplasty.  There is no significant retropulsion of fragments in the patient's neurologic exam of the lower extremities remains intact.  The daughter would like to take the patient back to her living facility at this time and I see no problem with this.  PROCEDURES  Procedures   ED DIAGNOSES     ICD-10-CM   1. Fall from bed, initial encounter  W06.XXXA     2. Closed compression fracture of body of L1 vertebra (HCC)  S32.010A     3.  Closed fracture of sacrum, unspecified portion of sacrum, initial encounter ([place])  S32.10XA          Rahim Astorga, MD 12/18/22 [phone]

## 2022-12-19 DIAGNOSIS — R2689 Other abnormalities of gait and mobility: Secondary | ICD-10-CM | POA: Diagnosis not present

## 2022-12-19 DIAGNOSIS — R488 Other symbolic dysfunctions: Secondary | ICD-10-CM | POA: Diagnosis not present

## 2022-12-19 DIAGNOSIS — R41841 Cognitive communication deficit: Secondary | ICD-10-CM | POA: Diagnosis not present

## 2022-12-19 DIAGNOSIS — M6281 Muscle weakness (generalized): Secondary | ICD-10-CM | POA: Diagnosis not present

## 2023-02-16 ENCOUNTER — Encounter: Payer: Self-pay | Admitting: Family Medicine

## 2023-02-16 ENCOUNTER — Non-Acute Institutional Stay: Payer: Medicare Other | Admitting: Family Medicine

## 2023-02-16 VITALS — BP 118/58 | HR 99 | Temp 97.9°F | Resp 22 | Wt 142.4 lb

## 2023-02-16 DIAGNOSIS — K146 Glossodynia: Secondary | ICD-10-CM

## 2023-02-16 DIAGNOSIS — E43 Unspecified severe protein-calorie malnutrition: Secondary | ICD-10-CM

## 2023-02-16 NOTE — Progress Notes (Signed)
[facility] Consult Note Telephone: [phone]  Fax: [phone]   Date of encounter: 02/16/23 4:47 PM PATIENT NAME: Cindy [name] [address] [place] [address]   [phone] (home)  DOB: [birth year] MRN: [id number] PRIMARY CARE PROVIDER:    Clovia Cuff, MD,  [place] [address] [phone]  REFERRING PROVIDER:   Clovia Cuff, MD [address] [place],  [place] [address] [phone]  Health Care Agent:  Mariella Saa (pronounced [name]) [place]. Contact Information     Name Relation Home Work [place] Daughter   [phone]   Esmond Harps Daughter [phone]  [phone]   swan,pam Daughter [phone]  [phone]   Yates Decamp   [phone]        I met face to face with patient and her daughter Cindy [name] in [facility] facility. Palliative Care was asked to follow this patient by consultation request of Clovia Cuff, MD to address advance care planning and complex medical decision making. This is a follow up visit.  CODE STATUS: MOST as of 09/10/2019: DNR/DNI with limited additional intervention Use of antibiotics and IV fluids if indicated No feeding tube.   ASSESSMENT AND / RECOMMENDATIONS:  PPS:  Tongue sore Diff dx oral candidiasis, glossitis, mucositis, possible Covid tongue. No improvement with Nystatin swish and spit. Recommend checking vitamin B12 level. Encourage use of L-lysine/Vitamin C supplement over the counter for healing of mouth ulcers. Question possible benefit of GI cocktail with viscous lidocaine/Maalox for comfort (will discuss with facility NP)  2.   Severe protein calorie malnutrition Possibly worse secondary to recent covid. Encourage bland cool foods to limit burning and improve intake.    Follow up Palliative Care Visit:  Palliative Care continuing to follow up by monitoring for changes in appetite,  weight, functional and cognitive status for chronic disease progression and management in agreement with patient's stated goals of care. Next visit in 2-3 weeks or prn.  This visit was coded based on medical decision making (MDM).  Chief Complaint  Patient being followed for chronic medical management in setting of dementia with c/o poor appetite, mouth/tongue pain/burning.  HISTORY OF PRESENT ILLNESS: Cindy "Khamila [name] is a 89 y.o. year old female with dementia who recently had Covid infection.  Pt c/o soreness in her mouth, particularly on the left posterior lower jaw against the tongue.  Appetite is poor.  This has been present since she had a high fever and Covid in the last 2 weeks.  She is on Nystatin swish and spit with little relief.  The last Vitamin B 12 level was normal but was done in 2013. She has been using an Oragel mouth rinse with peroxide and menthol which she states burns severely.  She was treated at the time with a course of Lagevrio BID x 5 days which she completed on 01/25/23.  She denies cough, congestion, SOB.  She had recurrent high fevers per daughter when she had Covid then she became very fatigued after completing her treatment. Pt c/o nausea and is visibly uncomfortable and panting.    ACTIVITIES OF DAILY LIVING: CONTINENT OF BLADDER? No CONTINENT OF BOWEL? No  MOBILITY:   INDEPENDENTLY AMBULATORY?  No   AMBULATORY WITH ASSISTIVE DEVICE: WHEELCHAIR/POWER CHAIR? Uses wc   APPETITE? poor  CURRENT PROBLEM LIST:  Patient Active Problem List   Diagnosis Date Noted   Pulmonary hypertension, primary ([place]) 10/18/2022   Chronic combined systolic and diastolic heart failure ([place]) 10/17/2022  Symptomatic anemia 07/08/2021   CHF (congestive heart failure) ([place]) 08/14/2020   DVT (deep venous thrombosis) (HCC) 07/29/2020   Anemia 07/28/2020   Dementia with behavioral disturbance ([place])    DNR (do not resuscitate)    Palliative care by specialist    Adult failure  to thrive    Acetabular fracture ([place]) 07/07/2020   Nausea vomiting and diarrhea 03/09/2017   Dyspnea 12/25/2013   Restless leg syndrome 12/25/2013   Protein-calorie malnutrition, severe ([place]) 10/25/2013   Thrombocytopenia ([place]) 10/25/2013   Anal cancer ([place]) 09/09/2013   Colon cancer ([place]) 08/31/2013   Hand pain, left 10/22/2012   Anxiety 10/18/2012   Hyperventilation syndrome 10/18/2012   Headache(784.0) 10/18/2012   Neck pain on right side 10/18/2012   Hypertension 10/18/2012   GERD (gastroesophageal reflux disease) 10/18/2012   Hyperlipidemia 10/18/2012   Insomnia 10/18/2012   Hearing loss sensory, bilateral 10/18/2012   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/18/2012   Weakness generalized 10/17/2012   Hypothyroidism 10/17/2012   Fatigue 10/17/2012   PAST MEDICAL HISTORY:  Active Ambulatory Problems    Diagnosis Date Noted   Weakness generalized 10/17/2012   Hypothyroidism 10/17/2012   Fatigue 10/17/2012   Anxiety 10/18/2012   Hyperventilation syndrome 10/18/2012   Headache(784.0) 10/18/2012   Neck pain on right side 10/18/2012   Hypertension 10/18/2012   GERD (gastroesophageal reflux disease) 10/18/2012   Hyperlipidemia 10/18/2012   Insomnia 10/18/2012   Hearing loss sensory, bilateral 10/18/2012   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/18/2012   Hand pain, left 10/22/2012   Anal cancer ([place]) 09/09/2013   Colon cancer ([place]) 08/31/2013   Protein-calorie malnutrition, severe ([place]) 10/25/2013   Thrombocytopenia ([place]) 10/25/2013   Dyspnea 12/25/2013   Restless leg syndrome 12/25/2013   Nausea vomiting and diarrhea 03/09/2017   Acetabular fracture ([place]) 07/07/2020   DNR (do not resuscitate)    Palliative care by specialist    Adult failure to thrive    Dementia with behavioral disturbance ([place])    Anemia 07/28/2020   DVT (deep venous thrombosis) ([place]) 07/29/2020   CHF (congestive heart failure) ([place]) 08/14/2020   Symptomatic anemia 07/08/2021   Chronic combined  systolic and diastolic heart failure ([place]) 10/17/2022   Pulmonary hypertension, primary ([place]) 10/18/2022   Resolved Ambulatory Problems    Diagnosis Date Noted   Hyponatremia 10/17/2012   Hypokalemia 10/17/2012   Gastroparesis 10/22/2012   Colitis, acute 03/17/2013   Febrile neutropenia ([place]) 10/23/2013   Hypomagnesemia 10/24/2013   Mucositis 10/27/2013   Hypophosphatemia 10/27/2013   Chest pain 12/25/2013   Sepsis ([place]) 03/09/2017   Elevated troponin 03/09/2017   Pulmonary embolus ([place]) 07/28/2020   Pulmonary embolism ([place]) 07/28/2020   Acute respiratory failure with hypoxia ([place]) 07/28/2020   NSTEMI (non-ST elevated myocardial infarction) ([place]) 05/01/2021   Acute on chronic combined systolic (congestive) and diastolic (congestive) heart failure ([place]) 05/01/2021   Past Medical History:  Diagnosis Date   Arthritis    Cancer ([place])    Deaf, left    Depression    History of radiation therapy 10/10/13-11/23/13   Hyperlipemia    PONV (postoperative nausea and vomiting)    Skin cancer     Preferred Pharmacy: ALLERGIES:  Allergies  Allergen Reactions   Vancomycin Hives    Zosyn and vanc given together; unclear which was precipitating med. Appears to have tolerated cephalosporins in the past.   Zosyn [Piperacillin Sod-Tazobactam So] Hives    Zosyn and vanc given together; unclear which was precipitating med. Appears to have tolerated cephalosporins  in the past.   Hydrocodone Nausea Only   Morphine And Related Hives     PERTINENT MEDICATIONS:  Outpatient Encounter Medications as of 02/16/2023  Medication Sig   amLODipine (NORVASC) 5 MG tablet Take 5 mg by mouth daily.   ASA-APAP-CAFF BUFFERED PO Take 250 mg by mouth 3 (three) times daily. Takes 2 tabs po TiD Apap 250 mg/ASA 250 mg/Caffeine 65 mg (takes 2 tabs)   buPROPion (WELLBUTRIN SR) 200 MG 12 hr tablet Take 200 mg by mouth daily.    busPIRone (BUSPAR) 5 MG tablet Take 5 mg by mouth 3 (three) times daily.   carvedilol  (COREG) 6.25 MG tablet Take 1 tablet (6.25 mg total) by mouth 2 (two) times daily.   docusate sodium (COLACE) 100 MG capsule Take 100 mg by mouth 2 (two) times daily.   escitalopram (LEXAPRO) 20 MG tablet Take 20 mg by mouth daily.   ferrous sulfate 325 (65 FE) MG tablet Take 325 mg by mouth daily with breakfast.   folic acid (FOLVITE) 1 MG tablet Take 1 mg by mouth daily.   gabapentin (NEURONTIN) 300 MG capsule Take 300 mg by mouth at bedtime.   levothyroxine (SYNTHROID) 112 MCG tablet Take 112 mcg by mouth daily.   loperamide (IMODIUM) 2 MG capsule Take 2 mg by mouth 4 (four) times daily as needed for diarrhea or loose stools.   losartan (COZAAR) 50 MG tablet Take 100 mg by mouth daily.   Methen-Hyosc-Meth Blue-Na Phos (ME/NAPHOS/MB/HYO1 PO) Take 1 tablet by mouth 2 (two) times daily.   ondansetron (ZOFRAN) 4 MG tablet Take 4 mg by mouth every 8 (eight) hours as needed for nausea/vomiting.   polyethylene glycol (MIRALAX / GLYCOLAX) 17 g packet Take 17 g by mouth See admin instructions. 17g every other day as needed for constipation   traMADol (ULTRAM) 50 MG tablet Take 50 mg by mouth 2 (two) times daily.   No facility-administered encounter medications on file as of 02/16/2023.    History obtained from review of EMR, discussion with family, facility staff/caregiver and/or patient.     I reviewed available labs, medications, imaging, studies and related documents from the EMR.  There were no new records/imaging since last visit. Records reviewed and summarized above.   Physical Exam: 08/04/2022 weight 150 lbs GENERAL: Appears uncomfortable HEENT:  no lymphadenopathy or submandibular pain to palpation LUNGS: CTAB, no increased work of breathing, room air CARDIAC:  S1S2, RRR with no MRG, No edema or cyanosis ABD:  Normo-active BS x 4 quads, soft, non-tender EXTREMITIES: Normal ROM, no deformity, Weaker in BLE, wearing a TLSO brace NEURO:  No weakness, noted mild-mod cognitive  impairment PSYCH:  non-anxious affect, A & O x 3  Thank you for the opportunity to participate in the care of Carmine "Eva" Hoffstetter. Please call our main office at [phone] if we can be of additional assistance.    Damaris Hippo FNP-C  Perian Tedder.[email or web address] .[facility]  Phone:  [phone]

## 2023-02-22 DIAGNOSIS — K146 Glossodynia: Secondary | ICD-10-CM | POA: Insufficient documentation

## 2023-03-26 ENCOUNTER — Telehealth: Payer: Self-pay | Admitting: Family Medicine

## 2023-03-26 NOTE — Telephone Encounter (Signed)
TCF pt's daughter, returned call.  She wanted to know source of referral  Informed that this is usually the facility PCP or community PCP.  Educated on routine follow up for Palliative Care Services to provide symptom management. She said her mother had Covid yet again then had 5 teeth pulled.  She states her intake is poor and she is not drinking well stating that there is a magnetic taste in her mouth.  Explained the expected course of dementia and she states she is wondering if some of this is related to Covid vs general progression of her disease process. Encouraged her to consider offering foods that are sweet or sour as these are some of the last tastes to go.  Encouraged her to consider supplementing with protein which would not necessarily change the taste but may thicken the texture slightly.  Also informed that appetite stimulant may help improve intake.  Advised that having an acute illness will likely cause a worsening of existing disease process and usually as it improves then the function may not return to baseline but eventually will just continue to decline.  Advised on PPS score measurement and generalized decline as an indication of prognosis and when 6 months or less.  Advised at that point the Hospice team could speak with the family regarding Hospice services and possible adjustments in treatment regimen to eliminate some of the burdens of medication that did not contribute directly to their comfort.  She does endorse nursing giving pills in applesauce or ice cream to get calories in and aid swallow.  Indicated normal at endstage disease process to eat/drink less, sleep more. Also advised that it is normal for the patient with dementia to regress to childhood and pt may be at about the cognitive age of a 89 year old and they need simple one step instructions to be able to do what is being asked.  Advised that unlike memories of the past like files in a file cabinet where the individual "knows"  where to locate the file, new memories are not the same so it takes longer for the individual to put them in a file and longer still to remember where they can locate it. Discussed differences between Palliative and Hospice and that pt may be becoming Hospice Appropriate if they want.  She states her brother is coming in from [place] on Monday and I indicated that I would be happy to come by and re-evaluate with her Monday afternoon and meet up with the brother.  She will email me the details of his visit to coordinate. Time spent on phone with patient's daughter:  18:12 min Damaris Hippo FNP-C

## 2023-03-30 ENCOUNTER — Non-Acute Institutional Stay: Payer: Medicare Other | Admitting: Family Medicine

## 2023-03-30 ENCOUNTER — Encounter: Payer: Self-pay | Admitting: Family Medicine

## 2023-03-30 VITALS — BP 98/64 | HR 94 | Temp 98.6°F | Resp 22 | Wt 113.0 lb

## 2023-03-30 DIAGNOSIS — I959 Hypotension, unspecified: Secondary | ICD-10-CM

## 2023-03-30 DIAGNOSIS — Z7189 Other specified counseling: Secondary | ICD-10-CM

## 2023-03-30 DIAGNOSIS — F03918 Unspecified dementia, unspecified severity, with other behavioral disturbance: Secondary | ICD-10-CM

## 2023-03-30 DIAGNOSIS — R471 Dysarthria and anarthria: Secondary | ICD-10-CM

## 2023-03-30 DIAGNOSIS — R110 Nausea: Secondary | ICD-10-CM

## 2023-03-30 DIAGNOSIS — R1310 Dysphagia, unspecified: Secondary | ICD-10-CM

## 2023-03-30 DIAGNOSIS — E43 Unspecified severe protein-calorie malnutrition: Secondary | ICD-10-CM

## 2023-03-30 NOTE — Progress Notes (Unsigned)
Therapist, nutritional Palliative Care Consult Note Telephone: [phone]  Fax: [phone]   Date of encounter: 03/30/23 10:00 AM  PATIENT NAME: Cindy [name] [address] [place] [address]   [phone] (home)  DOB: 07-09-1930 MRN: [id number] PRIMARY CARE PROVIDER:    Annita Brod, MD,  [address] [place] [address] [phone] Ilsa Iha, NP  REFERRING PROVIDER:   Annita Brod, MD [address] [place],  [place] [address] [phone]  Health Care Agent:  Dorann Ou (pronounced Jasmine December) Cupit. Contact Information     Name Relation Home Work Mobile   Cupit,Sherian Daughter   [phone]   Margarita Sermons Daughter [phone]  [phone]   swan,pam Daughter [phone]  [phone]   Donnamarie Rossetti   [phone]        I met face to face with patient and her daughters Dorann Ou and [place] in [facility] facility. Palliative Care was asked to follow this patient by consultation request of Annita Brod, MD to address advance care planning and complex medical decision making. This is a follow up visit.  ADVANCE CARE PLANNING/GOALS OF CARE: Health care surrogate gave her permission to discuss.   PRESENT FOR DISCUSSION: Ander Purpura (daughter, [facility] POA) and Margarita Sermons (daughter), and Joycelyn Man FNP-C  GOALS:  To provide comfort, not prolong suffering and debulk medications.  BARRIERS: None  HEALTH CARE POA AND PHONE NUMBER: Ander Purpura   Our advance care planning conversation included a discussion about:    Discussion of risks/benefits to ST swallow  evaluation vs modified barium swallow to      evaluate reported globus              sensation/swallowing impairment and              possible invasive interventions like dilatation. The normal disease progression of dementia with regression to childlike cognitive reasoning, progressive difficulty with swallow that may put her at risk  of aspiration. Discussed that health insults cause worsening      decline in function which may improve some      but usually don't return to former baseline       and eventually continue to decline.\      Discussed benefits for grief counseling for      13 months after patient passes for family with       Hospice benefit and service support from      Hospice.      Encourage intake of cold foods with strong       sweet/spicy or salty flavors to improve intake       with added protein.  Encourage comfort       feeds.  Discussed significant weight loss and      if trying appetite stimulant like Megace that it      needs more than 4 or 5 days.      Discussed removing non-essential meds       several of which have been there long term       and not re-addressed:  Pantoprazole,        Cetirizine, possibly Megace and encouraged      discussion with PCP Ilsa Iha, NP. Met with Ilsa Iha, NP in facility, discussed findings today and recommendations discussed with family who were in agreement with referral to Hospice. Aurea Graff agreed after seeing pt that she was in agreement that on current course pt has 6 months or less and will remain pt's attending.  Verbal orders given for Hospice referral.  Assessment/Plan: Counseling regarding end of life decision making Prioritize comfort Discussed trade offs of appetite stimulant and investigating swallowing issues for diagnostic burdens/benefits Decrease unnecessary meds that do not provide comfort or have immediate benefit (recommendations made to PCP) Hospice appropriate, coordinating with PCP for referral.  CODE STATUS: MOST as of 09/06/2019: DNR/DNI with limited additional intervention Use of antibiotics and IV fluids, if indicated No feeding tube.   I spent 65 minutes providing this consultation with more than 50% of the time spent on counseling patient and coordinating communication with referring provider, staff/family, chart review and  documentation. ----------------------------------------------------------------------------------------------------------------------------------------------  This visit was coded based on medical decision making (MDM).  ASSESSMENT AND / RECOMMENDATIONS:  PPS: 30%  Dementia without behavioral disturbance FAST 7 score 7A Encourage removal of medications with anticholinergic properties like Cetirizine. Recommend weaning of 1 antidepressant such as Lexapro, continue remaining antidepressant and Buspar for anxiety management. Continue prn Xanax.  2.   Dysarthria and swallowing impairment Question likely neurologic insult, possible CVA. Maintain slightly elevated BP, avoid hypotension or severe hypertension to maintain optimal cerebral perfusion. Family not interested in EGD for esophageal dilatation if stricture. Try soft, cool foods to avoid mouth pain.  No evidence of thrush presently and has been on Nystatin for extended period. Needs supervised intake while upright, cues for chin tuck, use of straw to aid drinking. Recommend comfort feeds.  3.   Hypotension Currently SBP in 90s today without any antihypertensive med yet with labile Bps and recent SBP > 200. Recommend holding Losartan 100 mg daily for today and if SBP < 115. Hypotension could be contributing some to nausea.  4.   Nausea Recommend changing Ondansetron 4 mg oral tablet to 4 mg ODT formulation to see if this makes a difference. Attempt to maintain SBP 120-150 to maintain optimal cerebral perfusion. Offer small amounts or protein rich compact foods that pt likes every 2-3 hours while awake. If changing to ODT formulation ineffective would consider adding Haldol for antiemetic properties.  5   Severe protein calorie malnutrition Loss of 29 lbs since January when weight 142 lbs Possibly worse secondary to recent covid with changes in tastes and dental extraction of front lower teeth. Offer cold foods, soft/smooth or  pureed consistency with either strong sweet, sour foods to improve intake.    Follow up Palliative Care Visit:  Palliative Care and PCP recommending Hospice referral.  Chief Complaint  Palliative Care is making a follow up visit for chronic medical management in setting of dementia after recent covid infection with c/o frequent nausea.   HISTORY OF PRESENT ILLNESS: Khrystal "Ilissa Bonnin is a 89 y.o. year old female with dementia who recently had Covid infection.  Pt c/o frequent nausea, intermittently worse with use of Ondansetron po pills.  Appetite is poor. She c/o metallic tastes to food/liquids since Covid.  She c/o globus sensation at the back of her throat with difficulty swallowing.  Pt has noted dysarthric speech which was not present before on this provider's assessment.  Daughters deny any known stroke but indicate pt's BP has been labile from 90s to 200s.  Pt was able to help with transfers previously and since having Covid has been requiring max assist to stand and pivot.  Weight in January was 142 lbs.  As of 03/24/23 was down to 113.4. Pt continues to c/o mouth burning and has been on Nystatin swish and spit for over 1 month. Pt actually demonstrates strong facial grimace  when attempting to swallow some fluids.   ACTIVITIES OF DAILY LIVING: CONTINENT OF BLADDER? No CONTINENT OF BOWEL? No BATHING/DRESSING requires assistance Feeding is set up assistance  MOBILITY:   INDEPENDENTLY AMBULATORY?  No   AMBULATORY WITH ASSISTIVE DEVICE: WHEELCHAIR/POWER CHAIR? Uses wc   APPETITE? poor  CURRENT PROBLEM LIST:  Patient Active Problem List   Diagnosis Date Noted   Soreness of tongue 02/22/2023   Pulmonary hypertension, primary 10/18/2022   Chronic combined systolic and diastolic heart failure 10/17/2022   Symptomatic anemia 07/08/2021   CHF (congestive heart failure) 08/14/2020   DVT (deep venous thrombosis) 07/29/2020   Anemia 07/28/2020   Dementia with behavioral disturbance     DNR (do not resuscitate)    Palliative care by specialist    Adult failure to thrive    Acetabular fracture 07/07/2020   Nausea vomiting and diarrhea 03/09/2017   Dyspnea 12/25/2013   Restless leg syndrome 12/25/2013   Protein-calorie malnutrition, severe 10/25/2013   Thrombocytopenia 10/25/2013   Anal cancer 09/09/2013   Colon cancer 08/31/2013   Hand pain, left 10/22/2012   Anxiety 10/18/2012   Hyperventilation syndrome 10/18/2012   Headache(784.0) 10/18/2012   Neck pain on right side 10/18/2012   Hypertension 10/18/2012   GERD (gastroesophageal reflux disease) 10/18/2012   Hyperlipidemia 10/18/2012   Insomnia 10/18/2012   Hearing loss sensory, bilateral 10/18/2012   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/18/2012   Weakness generalized 10/17/2012   Hypothyroidism 10/17/2012   Fatigue 10/17/2012   PAST MEDICAL HISTORY:  Active Ambulatory Problems    Diagnosis Date Noted   Weakness generalized 10/17/2012   Hypothyroidism 10/17/2012   Fatigue 10/17/2012   Anxiety 10/18/2012   Hyperventilation syndrome 10/18/2012   Headache(784.0) 10/18/2012   Neck pain on right side 10/18/2012   Hypertension 10/18/2012   GERD (gastroesophageal reflux disease) 10/18/2012   Hyperlipidemia 10/18/2012   Insomnia 10/18/2012   Hearing loss sensory, bilateral 10/18/2012   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/18/2012   Hand pain, left 10/22/2012   Anal cancer 09/09/2013   Colon cancer 08/31/2013   Protein-calorie malnutrition, severe 10/25/2013   Thrombocytopenia 10/25/2013   Dyspnea 12/25/2013   Restless leg syndrome 12/25/2013   Nausea vomiting and diarrhea 03/09/2017   Acetabular fracture 07/07/2020   DNR (do not resuscitate)    Palliative care by specialist    Adult failure to thrive    Dementia with behavioral disturbance    Anemia 07/28/2020   DVT (deep venous thrombosis) 07/29/2020   CHF (congestive heart failure) 08/14/2020   Symptomatic anemia 07/08/2021   Chronic  combined systolic and diastolic heart failure 10/17/2022   Pulmonary hypertension, primary 10/18/2022   Soreness of tongue 02/22/2023   Resolved Ambulatory Problems    Diagnosis Date Noted   Hyponatremia 10/17/2012   Hypokalemia 10/17/2012   Gastroparesis 10/22/2012   Colitis, acute 03/17/2013   Febrile neutropenia (HCC) 10/23/2013   Hypomagnesemia 10/24/2013   Mucositis 10/27/2013   Hypophosphatemia 10/27/2013   Chest pain 12/25/2013   Sepsis 03/09/2017   Elevated troponin 03/09/2017   Pulmonary embolus 07/28/2020   Pulmonary embolism 07/28/2020   Acute respiratory failure with hypoxia 07/28/2020   NSTEMI (non-ST elevated myocardial infarction) 05/01/2021   Acute on chronic combined systolic (congestive) and diastolic (congestive) heart failure 05/01/2021   Past Medical History:  Diagnosis Date   Arthritis    Cancer    Deaf, left    Depression    History of radiation therapy 10/10/13-11/23/13   Hyperlipemia    PONV (postoperative nausea  and vomiting)    Skin cancer     Preferred Pharmacy: ALLERGIES:  Allergies  Allergen Reactions   Vancomycin Hives    Zosyn and vanc given together; unclear which was precipitating med. Appears to have tolerated cephalosporins in the past.   Zosyn [Piperacillin Sod-Tazobactam So] Hives    Zosyn and vanc given together; unclear which was precipitating med. Appears to have tolerated cephalosporins in the past.   Hydrocodone Nausea Only   Morphine And Related Hives     PERTINENT MEDICATIONS:  Outpatient Encounter Medications as of 03/30/2023  Medication Sig   ASA-APAP-CAFF BUFFERED PO Take 250 mg by mouth 3 (three) times daily. Takes 2 tabs po TiD Apap 250 mg/ASA 250 mg/Caffeine 65 mg (takes 2 tabs)   buPROPion (WELLBUTRIN SR) 200 MG 12 hr tablet Take 200 mg by mouth daily.    busPIRone (BUSPAR) 5 MG tablet Take 5 mg by mouth 3 (three) times daily.   docusate sodium (COLACE) 100 MG capsule Take 100 mg by mouth 2 (two) times daily.    escitalopram (LEXAPRO) 20 MG tablet Take 20 mg by mouth daily.   levothyroxine (SYNTHROID) 112 MCG tablet Take 112 mcg by mouth daily.   loperamide (IMODIUM) 2 MG capsule Take 2 mg by mouth 4 (four) times daily as needed for diarrhea or loose stools.   losartan (COZAAR) 50 MG tablet Take 100 mg by mouth daily.   Methen-Hyosc-Meth Blue-Na Phos (ME/NAPHOS/MB/HYO1 PO) Take 1 tablet by mouth 2 (two) times daily.   ondansetron (ZOFRAN) 4 MG tablet Take 4 mg by mouth every 8 (eight) hours as needed for nausea/vomiting.   polyethylene glycol (MIRALAX / GLYCOLAX) 17 g packet Take 17 g by mouth See admin instructions. 17g every other day as needed for constipation   traMADol (ULTRAM) 50 MG tablet Take 50 mg by mouth 2 (two) times daily.   [DISCONTINUED] amLODipine (NORVASC) 5 MG tablet Take 5 mg by mouth daily.   [DISCONTINUED] carvedilol (COREG) 6.25 MG tablet Take 1 tablet (6.25 mg total) by mouth 2 (two) times daily.   [DISCONTINUED] ferrous sulfate 325 (65 FE) MG tablet Take 325 mg by mouth daily with breakfast.   [DISCONTINUED] folic acid (FOLVITE) 1 MG tablet Take 1 mg by mouth daily.   [DISCONTINUED] gabapentin (NEURONTIN) 300 MG capsule Take 300 mg by mouth at bedtime.   No facility-administered encounter medications on file as of 03/30/2023.    History obtained from review of EMR, discussion with family, facility staff/caregiver and/or patient.     I reviewed available labs, medications, imaging, studies and related documents from the EMR.  There were no new records/imaging since last visit.   Physical Exam: 03/2022 weight 113 lbs, Jan 2024 weight 142 lbs GENERAL: Appears uncomfortable with intermittent facial grimace HEENT: apparent odynophagia  LUNGS: CTAB, no increased work of breathing, room air CARDIAC:  S1S2, RRR with LUSB murmur, No edema or cyanosis ABD:  Normo-active BS x 4 quads, soft, non-tender EXTREMITIES: Generalized weakness, wearing a TLSO brace NEURO:  No weakness,  dysarthria, confusion cognitive impairment.  No noted focal deficit other than speech and swallow PSYCH:  non-anxious affect, A & O x 1-2  Thank you for the opportunity to participate in the care of Peytan "Carley Hammedva" Hoffstetter. Please call our main office at [phone] if we can be of additional assistance.    Joycelyn ManKaren Dwyane Dupree FNP-C  [email or web address]  Phone:  [phone]

## 2023-04-01 DIAGNOSIS — I959 Hypotension, unspecified: Secondary | ICD-10-CM | POA: Insufficient documentation

## 2023-04-01 DIAGNOSIS — R1310 Dysphagia, unspecified: Secondary | ICD-10-CM | POA: Insufficient documentation

## 2023-04-01 DIAGNOSIS — R471 Dysarthria and anarthria: Secondary | ICD-10-CM | POA: Insufficient documentation

## 2023-04-01 DIAGNOSIS — R110 Nausea: Secondary | ICD-10-CM | POA: Insufficient documentation

## 2023-04-01 DIAGNOSIS — Z7189 Other specified counseling: Secondary | ICD-10-CM | POA: Insufficient documentation

## 2023-04-22 DEATH — deceased
